# Patient Record
Sex: Female | Born: 1950
Health system: Southern US, Community
[De-identification: ages and names within clinical notes are randomized; demographics above are authoritative.]

## PROBLEM LIST (undated history)

## (undated) DIAGNOSIS — K219 Gastro-esophageal reflux disease without esophagitis: Secondary | ICD-10-CM

## (undated) DIAGNOSIS — R0600 Dyspnea, unspecified: Secondary | ICD-10-CM

## (undated) DIAGNOSIS — J189 Pneumonia, unspecified organism: Secondary | ICD-10-CM

## (undated) DIAGNOSIS — B009 Herpesviral infection, unspecified: Secondary | ICD-10-CM

## (undated) DIAGNOSIS — S32020A Wedge compression fracture of second lumbar vertebra, initial encounter for closed fracture: Secondary | ICD-10-CM

## (undated) DIAGNOSIS — Z915 Personal history of self-harm: Secondary | ICD-10-CM

## (undated) DIAGNOSIS — F1011 Alcohol abuse, in remission: Secondary | ICD-10-CM

## (undated) DIAGNOSIS — F32A Depression, unspecified: Secondary | ICD-10-CM

## (undated) DIAGNOSIS — G8929 Other chronic pain: Secondary | ICD-10-CM

## (undated) DIAGNOSIS — M549 Dorsalgia, unspecified: Secondary | ICD-10-CM

## (undated) DIAGNOSIS — F329 Major depressive disorder, single episode, unspecified: Secondary | ICD-10-CM

## (undated) DIAGNOSIS — R112 Nausea with vomiting, unspecified: Secondary | ICD-10-CM

## (undated) DIAGNOSIS — G43909 Migraine, unspecified, not intractable, without status migrainosus: Secondary | ICD-10-CM

## (undated) DIAGNOSIS — F419 Anxiety disorder, unspecified: Secondary | ICD-10-CM

## (undated) DIAGNOSIS — Z9151 Personal history of suicidal behavior: Secondary | ICD-10-CM

## (undated) DIAGNOSIS — Z9889 Other specified postprocedural states: Secondary | ICD-10-CM

## (undated) DIAGNOSIS — Z9089 Acquired absence of other organs: Secondary | ICD-10-CM

## (undated) DIAGNOSIS — D649 Anemia, unspecified: Secondary | ICD-10-CM

## (undated) DIAGNOSIS — IMO0002 Reserved for concepts with insufficient information to code with codable children: Secondary | ICD-10-CM

## (undated) DIAGNOSIS — Z8719 Personal history of other diseases of the digestive system: Secondary | ICD-10-CM

## (undated) DIAGNOSIS — E039 Hypothyroidism, unspecified: Secondary | ICD-10-CM

## (undated) DIAGNOSIS — F319 Bipolar disorder, unspecified: Secondary | ICD-10-CM

## (undated) HISTORY — PX: EYE SURGERY: SHX253

## (undated) HISTORY — DX: Migraine, unspecified, not intractable, without status migrainosus: G43.909

## (undated) HISTORY — PX: COLONOSCOPY: SHX174

## (undated) HISTORY — PX: HYSTEROSCOPY: SHX211

## (undated) HISTORY — PX: TONSILLECTOMY: SUR1361

## (undated) HISTORY — PX: LUMBAR FUSION: SHX111

## (undated) HISTORY — PX: OTHER SURGICAL HISTORY: SHX169

## (undated) HISTORY — DX: Herpesviral infection, unspecified: B00.9

## (undated) HISTORY — PX: DILATION AND CURETTAGE OF UTERUS: SHX78

---

## 1998-07-31 ENCOUNTER — Emergency Department (HOSPITAL_COMMUNITY): Admission: EM | Admit: 1998-07-31 | Discharge: 1998-07-31 | Payer: Self-pay | Admitting: Emergency Medicine

## 1998-12-19 ENCOUNTER — Other Ambulatory Visit: Admission: RE | Admit: 1998-12-19 | Discharge: 1998-12-19 | Payer: Self-pay | Admitting: Obstetrics and Gynecology

## 2000-03-13 ENCOUNTER — Other Ambulatory Visit: Admission: RE | Admit: 2000-03-13 | Discharge: 2000-03-13 | Payer: Self-pay | Admitting: Obstetrics and Gynecology

## 2001-04-07 ENCOUNTER — Other Ambulatory Visit: Admission: RE | Admit: 2001-04-07 | Discharge: 2001-04-07 | Payer: Self-pay | Admitting: Obstetrics and Gynecology

## 2002-04-08 ENCOUNTER — Other Ambulatory Visit: Admission: RE | Admit: 2002-04-08 | Discharge: 2002-04-08 | Payer: Self-pay | Admitting: Obstetrics and Gynecology

## 2002-07-17 ENCOUNTER — Encounter: Admission: RE | Admit: 2002-07-17 | Discharge: 2002-07-17 | Payer: Self-pay | Admitting: Cardiology

## 2002-07-17 ENCOUNTER — Encounter: Payer: Self-pay | Admitting: Cardiology

## 2003-04-05 ENCOUNTER — Other Ambulatory Visit: Admission: RE | Admit: 2003-04-05 | Discharge: 2003-04-05 | Payer: Self-pay | Admitting: Obstetrics and Gynecology

## 2004-04-07 ENCOUNTER — Other Ambulatory Visit: Admission: RE | Admit: 2004-04-07 | Discharge: 2004-04-07 | Payer: Self-pay | Admitting: Obstetrics and Gynecology

## 2005-06-17 ENCOUNTER — Emergency Department (HOSPITAL_COMMUNITY): Admission: EM | Admit: 2005-06-17 | Discharge: 2005-06-17 | Payer: Self-pay | Admitting: Emergency Medicine

## 2005-09-04 ENCOUNTER — Other Ambulatory Visit: Admission: RE | Admit: 2005-09-04 | Discharge: 2005-09-04 | Payer: Self-pay | Admitting: Obstetrics and Gynecology

## 2006-08-28 ENCOUNTER — Ambulatory Visit: Payer: Self-pay | Admitting: Pulmonary Disease

## 2006-09-02 ENCOUNTER — Emergency Department (HOSPITAL_COMMUNITY): Admission: EM | Admit: 2006-09-02 | Discharge: 2006-09-02 | Payer: Self-pay | Admitting: Emergency Medicine

## 2006-09-04 ENCOUNTER — Inpatient Hospital Stay (HOSPITAL_COMMUNITY): Admission: AD | Admit: 2006-09-04 | Discharge: 2006-09-09 | Payer: Self-pay | Admitting: Pulmonary Disease

## 2006-09-04 ENCOUNTER — Ambulatory Visit: Payer: Self-pay | Admitting: Pulmonary Disease

## 2006-09-23 ENCOUNTER — Other Ambulatory Visit: Admission: RE | Admit: 2006-09-23 | Discharge: 2006-09-23 | Payer: Self-pay | Admitting: Obstetrics and Gynecology

## 2006-09-25 ENCOUNTER — Ambulatory Visit: Payer: Self-pay | Admitting: Pulmonary Disease

## 2008-04-05 ENCOUNTER — Other Ambulatory Visit: Admission: RE | Admit: 2008-04-05 | Discharge: 2008-04-05 | Payer: Self-pay | Admitting: Obstetrics and Gynecology

## 2008-04-05 ENCOUNTER — Encounter: Payer: Self-pay | Admitting: Obstetrics and Gynecology

## 2008-04-05 ENCOUNTER — Ambulatory Visit: Payer: Self-pay | Admitting: Obstetrics and Gynecology

## 2008-06-10 ENCOUNTER — Ambulatory Visit: Payer: Self-pay | Admitting: Obstetrics and Gynecology

## 2008-06-10 ENCOUNTER — Other Ambulatory Visit: Admission: RE | Admit: 2008-06-10 | Discharge: 2008-06-10 | Payer: Self-pay | Admitting: Obstetrics and Gynecology

## 2008-06-10 ENCOUNTER — Encounter: Payer: Self-pay | Admitting: Obstetrics and Gynecology

## 2008-06-21 ENCOUNTER — Ambulatory Visit: Payer: Self-pay | Admitting: Obstetrics and Gynecology

## 2008-07-09 ENCOUNTER — Ambulatory Visit: Payer: Self-pay | Admitting: Obstetrics and Gynecology

## 2008-08-03 ENCOUNTER — Encounter: Payer: Self-pay | Admitting: Pulmonary Disease

## 2008-08-11 ENCOUNTER — Encounter: Payer: Self-pay | Admitting: Pulmonary Disease

## 2008-08-11 ENCOUNTER — Telehealth (INDEPENDENT_AMBULATORY_CARE_PROVIDER_SITE_OTHER): Payer: Self-pay | Admitting: *Deleted

## 2008-08-13 ENCOUNTER — Encounter: Admission: RE | Admit: 2008-08-13 | Discharge: 2008-08-13 | Payer: Self-pay | Admitting: Allergy and Immunology

## 2008-08-13 ENCOUNTER — Ambulatory Visit: Payer: Self-pay | Admitting: Pulmonary Disease

## 2008-08-13 DIAGNOSIS — F319 Bipolar disorder, unspecified: Secondary | ICD-10-CM | POA: Insufficient documentation

## 2008-08-13 DIAGNOSIS — E7800 Pure hypercholesterolemia, unspecified: Secondary | ICD-10-CM | POA: Insufficient documentation

## 2008-08-13 DIAGNOSIS — K449 Diaphragmatic hernia without obstruction or gangrene: Secondary | ICD-10-CM | POA: Insufficient documentation

## 2008-08-13 DIAGNOSIS — J449 Chronic obstructive pulmonary disease, unspecified: Secondary | ICD-10-CM | POA: Insufficient documentation

## 2008-08-13 DIAGNOSIS — K219 Gastro-esophageal reflux disease without esophagitis: Secondary | ICD-10-CM | POA: Insufficient documentation

## 2008-08-13 DIAGNOSIS — T7840XA Allergy, unspecified, initial encounter: Secondary | ICD-10-CM | POA: Insufficient documentation

## 2008-08-13 DIAGNOSIS — E039 Hypothyroidism, unspecified: Secondary | ICD-10-CM | POA: Insufficient documentation

## 2008-09-03 ENCOUNTER — Encounter: Payer: Self-pay | Admitting: Pulmonary Disease

## 2008-11-02 ENCOUNTER — Encounter: Payer: Self-pay | Admitting: Pulmonary Disease

## 2008-11-16 ENCOUNTER — Encounter: Payer: Self-pay | Admitting: Obstetrics and Gynecology

## 2008-11-16 ENCOUNTER — Ambulatory Visit: Payer: Self-pay | Admitting: Obstetrics and Gynecology

## 2008-11-16 ENCOUNTER — Other Ambulatory Visit: Admission: RE | Admit: 2008-11-16 | Discharge: 2008-11-16 | Payer: Self-pay | Admitting: Obstetrics and Gynecology

## 2008-11-26 ENCOUNTER — Ambulatory Visit: Payer: Self-pay | Admitting: Obstetrics and Gynecology

## 2008-12-01 ENCOUNTER — Ambulatory Visit (HOSPITAL_BASED_OUTPATIENT_CLINIC_OR_DEPARTMENT_OTHER): Admission: RE | Admit: 2008-12-01 | Discharge: 2008-12-01 | Payer: Self-pay | Admitting: Obstetrics and Gynecology

## 2008-12-01 ENCOUNTER — Ambulatory Visit: Payer: Self-pay | Admitting: Obstetrics and Gynecology

## 2008-12-01 ENCOUNTER — Encounter: Payer: Self-pay | Admitting: Obstetrics and Gynecology

## 2009-06-10 ENCOUNTER — Ambulatory Visit: Payer: Self-pay | Admitting: Obstetrics and Gynecology

## 2009-06-10 ENCOUNTER — Other Ambulatory Visit: Admission: RE | Admit: 2009-06-10 | Discharge: 2009-06-10 | Payer: Self-pay | Admitting: Obstetrics and Gynecology

## 2009-06-20 ENCOUNTER — Ambulatory Visit: Payer: Self-pay | Admitting: Obstetrics and Gynecology

## 2009-07-04 ENCOUNTER — Ambulatory Visit: Payer: Self-pay | Admitting: Obstetrics and Gynecology

## 2009-08-24 ENCOUNTER — Ambulatory Visit: Payer: Self-pay | Admitting: Obstetrics and Gynecology

## 2009-10-12 ENCOUNTER — Ambulatory Visit: Payer: Self-pay | Admitting: Obstetrics and Gynecology

## 2010-01-11 ENCOUNTER — Encounter: Payer: Self-pay | Admitting: Pulmonary Disease

## 2010-01-16 ENCOUNTER — Emergency Department (HOSPITAL_COMMUNITY)
Admission: EM | Admit: 2010-01-16 | Discharge: 2010-01-16 | Payer: Self-pay | Source: Home / Self Care | Admitting: Emergency Medicine

## 2010-02-20 ENCOUNTER — Ambulatory Visit: Payer: Self-pay | Admitting: Gynecology

## 2010-03-09 ENCOUNTER — Encounter: Payer: Self-pay | Admitting: Pulmonary Disease

## 2010-04-18 NOTE — Letter (Signed)
Summary: [facility]   Imported By: Lester  02/20/2010 09:07:59  _____________________________________________________________________  External Attachment:    Type:   Image     Comment:   External Document

## 2010-04-20 NOTE — Letter (Signed)
Summary: [facility]   Imported By: Sherian Rein 03/23/2010 10:11:03  _____________________________________________________________________  External Attachment:    Type:   Image     Comment:   External Document

## 2010-04-20 NOTE — Letter (Signed)
Summary: Addendum/[facility]  Addendum/[facility]   Imported By: Sherian Rein 03/23/2010 10:34:49  _____________________________________________________________________  External Attachment:    Type:   Image     Comment:   External Document

## 2010-06-01 ENCOUNTER — Other Ambulatory Visit (INDEPENDENT_AMBULATORY_CARE_PROVIDER_SITE_OTHER): Payer: BC Managed Care – PPO

## 2010-06-01 DIAGNOSIS — R635 Abnormal weight gain: Secondary | ICD-10-CM

## 2010-06-01 DIAGNOSIS — R5383 Other fatigue: Secondary | ICD-10-CM

## 2010-06-01 DIAGNOSIS — R5381 Other malaise: Secondary | ICD-10-CM

## 2010-06-01 DIAGNOSIS — E7800 Pure hypercholesterolemia, unspecified: Secondary | ICD-10-CM

## 2010-06-08 ENCOUNTER — Other Ambulatory Visit: Payer: Self-pay | Admitting: Neurosurgery

## 2010-06-08 DIAGNOSIS — S32040A Wedge compression fracture of fourth lumbar vertebra, initial encounter for closed fracture: Secondary | ICD-10-CM

## 2010-06-09 ENCOUNTER — Other Ambulatory Visit: Payer: Self-pay | Admitting: Neurosurgery

## 2010-06-09 ENCOUNTER — Ambulatory Visit
Admission: RE | Admit: 2010-06-09 | Discharge: 2010-06-09 | Disposition: A | Discharge status: Home / Self Care | Payer: BC Managed Care – PPO | Source: Ambulatory Visit | Attending: Neurosurgery | Admitting: Neurosurgery

## 2010-06-09 DIAGNOSIS — S32040A Wedge compression fracture of fourth lumbar vertebra, initial encounter for closed fracture: Secondary | ICD-10-CM

## 2010-06-09 DIAGNOSIS — S32009A Unspecified fracture of unspecified lumbar vertebra, initial encounter for closed fracture: Secondary | ICD-10-CM

## 2010-06-09 MED ORDER — GADOBENATE DIMEGLUMINE 529 MG/ML IV SOLN
15.0000 mL | Freq: Once | INTRAVENOUS | Status: AC | PRN
  Administered 2010-06-09: 15 mL via INTRAVENOUS

## 2010-06-12 ENCOUNTER — Other Ambulatory Visit (HOSPITAL_COMMUNITY)
Admission: RE | Admit: 2010-06-12 | Discharge: 2010-06-12 | Disposition: A | Discharge status: Home / Self Care | Payer: BC Managed Care – PPO | Source: Ambulatory Visit | Attending: Obstetrics and Gynecology | Admitting: Obstetrics and Gynecology

## 2010-06-12 ENCOUNTER — Encounter (INDEPENDENT_AMBULATORY_CARE_PROVIDER_SITE_OTHER): Payer: BC Managed Care – PPO | Admitting: Obstetrics and Gynecology

## 2010-06-12 ENCOUNTER — Other Ambulatory Visit: Payer: Self-pay | Admitting: Obstetrics and Gynecology

## 2010-06-12 DIAGNOSIS — Z124 Encounter for screening for malignant neoplasm of cervix: Secondary | ICD-10-CM | POA: Insufficient documentation

## 2010-06-12 DIAGNOSIS — Z01419 Encounter for gynecological examination (general) (routine) without abnormal findings: Secondary | ICD-10-CM

## 2010-06-13 ENCOUNTER — Other Ambulatory Visit: Payer: Self-pay | Admitting: Neurosurgery

## 2010-06-13 DIAGNOSIS — M5410 Radiculopathy, site unspecified: Secondary | ICD-10-CM

## 2010-06-13 DIAGNOSIS — M549 Dorsalgia, unspecified: Secondary | ICD-10-CM

## 2010-06-15 ENCOUNTER — Ambulatory Visit
Admission: RE | Admit: 2010-06-15 | Discharge: 2010-06-15 | Disposition: A | Discharge status: Home / Self Care | Payer: BC Managed Care – PPO | Source: Ambulatory Visit | Attending: Neurosurgery | Admitting: Neurosurgery

## 2010-06-15 DIAGNOSIS — M549 Dorsalgia, unspecified: Secondary | ICD-10-CM

## 2010-06-15 DIAGNOSIS — M5410 Radiculopathy, site unspecified: Secondary | ICD-10-CM

## 2010-06-22 DIAGNOSIS — Z1211 Encounter for screening for malignant neoplasm of colon: Secondary | ICD-10-CM

## 2010-07-20 ENCOUNTER — Other Ambulatory Visit (HOSPITAL_COMMUNITY): Payer: Self-pay | Admitting: Neurosurgery

## 2010-07-20 ENCOUNTER — Ambulatory Visit (HOSPITAL_COMMUNITY)
Admission: RE | Admit: 2010-07-20 | Discharge: 2010-07-20 | Disposition: A | Discharge status: Home / Self Care | Payer: BC Managed Care – PPO | Source: Ambulatory Visit | Attending: Neurosurgery | Admitting: Neurosurgery

## 2010-07-20 ENCOUNTER — Encounter (HOSPITAL_COMMUNITY)
Admission: RE | Admit: 2010-07-20 | Discharge: 2010-07-20 | Disposition: A | Discharge status: Home / Self Care | Payer: BC Managed Care – PPO | Source: Ambulatory Visit | Attending: Neurosurgery | Admitting: Neurosurgery

## 2010-07-20 DIAGNOSIS — R05 Cough: Secondary | ICD-10-CM | POA: Insufficient documentation

## 2010-07-20 DIAGNOSIS — K449 Diaphragmatic hernia without obstruction or gangrene: Secondary | ICD-10-CM | POA: Insufficient documentation

## 2010-07-20 DIAGNOSIS — Z01810 Encounter for preprocedural cardiovascular examination: Secondary | ICD-10-CM | POA: Insufficient documentation

## 2010-07-20 DIAGNOSIS — R0602 Shortness of breath: Secondary | ICD-10-CM | POA: Insufficient documentation

## 2010-07-20 DIAGNOSIS — Z01811 Encounter for preprocedural respiratory examination: Secondary | ICD-10-CM

## 2010-07-20 DIAGNOSIS — Z01812 Encounter for preprocedural laboratory examination: Secondary | ICD-10-CM | POA: Insufficient documentation

## 2010-07-20 DIAGNOSIS — Z01818 Encounter for other preprocedural examination: Secondary | ICD-10-CM | POA: Insufficient documentation

## 2010-07-20 DIAGNOSIS — R059 Cough, unspecified: Secondary | ICD-10-CM | POA: Insufficient documentation

## 2010-07-20 LAB — CBC
HCT: 42.7 % (ref 36.0–46.0)
Hemoglobin: 15 g/dL (ref 12.0–15.0)
MCH: 31.1 pg (ref 26.0–34.0)
MCHC: 35.1 g/dL (ref 30.0–36.0)
MCV: 88.4 fL (ref 78.0–100.0)
Platelets: 349 10*3/uL (ref 150–400)
RBC: 4.83 MIL/uL (ref 3.87–5.11)
RDW: 12.4 % (ref 11.5–15.5)
WBC: 7.7 10*3/uL (ref 4.0–10.5)

## 2010-07-20 LAB — TYPE AND SCREEN
ABO/RH(D): AB POS
Antibody Screen: NEGATIVE

## 2010-07-20 LAB — BASIC METABOLIC PANEL
BUN: 13 mg/dL (ref 6–23)
CO2: 29 mEq/L (ref 19–32)
Calcium: 10.4 mg/dL (ref 8.4–10.5)
Chloride: 91 mEq/L — ABNORMAL LOW (ref 96–112)
Creatinine, Ser: 0.66 mg/dL (ref 0.4–1.2)
GFR calc Af Amer: >60 mL/min (ref >60)
GFR calc non Af Amer: >60 mL/min (ref >60)
Glucose, Bld: 88 mg/dL (ref 70–99)
Potassium: 3.6 mEq/L (ref 3.5–5.1)
Sodium: 129 mEq/L — ABNORMAL LOW (ref 135–145)

## 2010-07-20 LAB — ABO/RH: ABO/RH(D): AB POS

## 2010-07-20 LAB — SURGICAL PCR SCREEN
MRSA, PCR: NEGATIVE
Staphylococcus aureus: NEGATIVE

## 2010-07-25 ENCOUNTER — Inpatient Hospital Stay (HOSPITAL_COMMUNITY): Payer: BC Managed Care – PPO

## 2010-07-25 ENCOUNTER — Inpatient Hospital Stay (HOSPITAL_COMMUNITY)
Admission: RE | Admit: 2010-07-25 | Discharge: 2010-08-01 | DRG: 756 | Disposition: A | Discharge status: Home Health | Payer: BC Managed Care – PPO | Source: Ambulatory Visit | Attending: Neurosurgery | Admitting: Neurosurgery

## 2010-07-25 DIAGNOSIS — M4310 Spondylolisthesis, site unspecified: Secondary | ICD-10-CM | POA: Diagnosis present

## 2010-07-25 DIAGNOSIS — M47817 Spondylosis without myelopathy or radiculopathy, lumbosacral region: Secondary | ICD-10-CM | POA: Diagnosis present

## 2010-07-25 DIAGNOSIS — M48061 Spinal stenosis, lumbar region without neurogenic claudication: Secondary | ICD-10-CM | POA: Diagnosis present

## 2010-07-25 DIAGNOSIS — M51379 Other intervertebral disc degeneration, lumbosacral region without mention of lumbar back pain or lower extremity pain: Principal | ICD-10-CM | POA: Diagnosis present

## 2010-07-25 DIAGNOSIS — M5137 Other intervertebral disc degeneration, lumbosacral region: Principal | ICD-10-CM | POA: Diagnosis present

## 2010-07-25 DIAGNOSIS — M810 Age-related osteoporosis without current pathological fracture: Secondary | ICD-10-CM | POA: Diagnosis present

## 2010-07-25 DIAGNOSIS — M7130 Other bursal cyst, unspecified site: Secondary | ICD-10-CM | POA: Diagnosis present

## 2010-07-26 ENCOUNTER — Inpatient Hospital Stay (HOSPITAL_COMMUNITY): Payer: BC Managed Care – PPO

## 2010-07-28 DIAGNOSIS — Q762 Congenital spondylolisthesis: Secondary | ICD-10-CM

## 2010-07-28 DIAGNOSIS — IMO0002 Reserved for concepts with insufficient information to code with codable children: Secondary | ICD-10-CM

## 2010-07-31 ENCOUNTER — Inpatient Hospital Stay (HOSPITAL_COMMUNITY): Payer: BC Managed Care – PPO

## 2010-07-31 LAB — URINALYSIS, ROUTINE W REFLEX MICROSCOPIC
Bilirubin Urine: NEGATIVE
Glucose, UA: NEGATIVE mg/dL
Ketones, ur: NEGATIVE mg/dL
Nitrite: NEGATIVE
Protein, ur: NEGATIVE mg/dL
Specific Gravity, Urine: 1.016 (ref 1.005–1.030)
Urobilinogen, UA: 1 mg/dL (ref 0.0–1.0)
pH: 6.5 (ref 5.0–8.0)

## 2010-07-31 LAB — CBC
HCT: 29 % — ABNORMAL LOW (ref 36.0–46.0)
Hemoglobin: 10.1 g/dL — ABNORMAL LOW (ref 12.0–15.0)
MCH: 30.5 pg (ref 26.0–34.0)
MCHC: 34.8 g/dL (ref 30.0–36.0)
MCV: 87.6 fL (ref 78.0–100.0)
Platelets: 403 K/uL — ABNORMAL HIGH (ref 150–400)
RBC: 3.31 MIL/uL — ABNORMAL LOW (ref 3.87–5.11)
RDW: 12.6 % (ref 11.5–15.5)
WBC: 11.2 K/uL — ABNORMAL HIGH (ref 4.0–10.5)

## 2010-07-31 LAB — URINE MICROSCOPIC-ADD ON

## 2010-08-01 NOTE — Discharge Summary (Signed)
NAMELORRI, FUKUHARA            ACCOUNT NO.:  [id number]   MEDICAL RECORD NO.:  [id number]          PATIENT TYPE:  INP   LOCATION:  5706                         FACILITY:  [facility]   PHYSICIAN:  Lonzo Cloud. Kriste Basque, MD     DATE OF BIRTH:  25-Nov-1950   DATE OF ADMISSION:  09/04/2006  DATE OF DISCHARGE:  09/09/2006                               DISCHARGE SUMMARY   FINAL DIAGNOSES:  1. Admitted 08/25/06 with refractory asthmatic bronchitis.  Treated      with IV Solu-Medrol, inhaled bronchodilators and mucolytic therapy.      The patient responded and discharged wheeze free on tapering dose      of prednisone, Symbicort and Xopenex metered-dose inhalers.  2. History of allergies with previous eczema and hay fever.  She was      on shots in the early 90s.  3. History of bipolar disease - treated by Dr. Wynonia Lawman, currently on      Prozac, Trileptal and Wellbutrin.  4. History of alcohol dependence in the past.  5. History of abnormal chest x-ray with right cardiophrenic angle fat      pad identified by CT years ago.  6. History of hypercholesterolemia - the patient controls this with      diet alone.  7. History of hypothyroidism - on Synthroid therapy.  8. Status post tonsillectomy and adenoidectomy in 1992 by Dr. Narda Bonds.  9. Status post right inguinal hernia repair in 1992 by Dr. Maryagnes Amos.  10.Known hiatus hernia - seen on chest x-rays.  11.History of sensitivity to prednisone which causes increased      anxiety.  She has a history of intolerance to lithium.   BRIEF HISTORY AND PHYSICAL:  The patient is a 60 year old white female  with a history of asthma and asthmatic bronchitis last seen in 2004.  She presented recently with a 60-month history of recurrent upper  respiratory tract infections, intermittent fevers and exacerbation of  her asthmatic bronchitis with cough, thick  sputum production, severe  congestion and chest tightness with wheezing and progressive dyspnea.  She  notes a chest soreness secondary to severe coughing paroxysms and  difficulty resting at night.  She states she had been receiving her  primary care at [facility] [place]  her employer [employer].  She would also [facility]  Most recently she had been on Biaxin without relief  of her symptoms.  Evaluation included chest x-ray that showed lingular  atelectasis and/or scarring; pulmonary function studies showing FEV-1 of  1.89 liters (70% of predicted).  She had been placed on Medrol,  Symbicort, Xopenex, Mucinex and Tussionex but proved intolerant to the  Medrol with increased anxiety and mania.  As noted above she has a  significant psychiatric history with bipolar disease and a history of  intolerance to lithium.  She is treated by Dr. Wynonia Lawman, currently on  Trileptal, Wellbutrin and Prozac.  The patient states that Dr. Wynonia Lawman  had requested that we admit the patient to the hospital for IV  Solu-  Medrol and careful monitoring.  He did not want the patient self  treating her anxiety and mania with oral Xanax, and per their request  stated she should be on IV Ativan.   PAST MEDICAL HISTORY:  In addition to her reactive airways disease she  has a history of allergies with eczema and hay fever in the past.  She  was previously on shots in the early 90s.  As noted she has a bipolar  disease, a history of alcohol dependence in the past and anxiety.  She  has a known abnormal chest x-ray with a right cardiophrenic angle fat  pad identified on CT.  She has a history of hypercholesterolemia that  she controls on diet.  She has a history of hypothyroidism and takes  Synthroid.  She has a known hiatus hernia that does not require any  specific therapy per the patient.  She had a previous tonsillectomy and  adenoidectomy in 1992 by Dr. Narda Bonds.  She had a previous right  inguinal hernia repair in 1992 by Dr. Maryagnes Amos.  She is  sensitive to Medrol  and intolerant to lithium as noted.   PHYSICAL EXAMINATION:  Physical examination at the time of admission  revealed a 60 year old white female in no acute distress.  Blood  pressure 130/90, pulse 100 and regular, respirations 20 per minute and  not labored, O2 sat 97% on room air, temperature 98.3.  HEENT exam was  unremarkable.  Neck exam showed no jugular distension, no carotid  bruits, no thyromegaly or lymphadenopathy.  Chest exam revealed  expiratory wheezing and rhonchi bilaterally.  No signs of consolidation.  Cardiac exam revealed regular rhythm, normal S1-S2 without murmurs, rubs  or gallops detected.  The abdomen was soft, minimal epigastric  tenderness on palpation.  No evidence of hepatosplenomegaly.  Normal  bowel sounds.  Rectal was deferred.  Extremities showed no cyanosis,  clubbing or edema.  Neurologic exam was intact without any focal  neurologic changes.  Skin exam was negative.   LABORATORY DATA:  Chest x-ray revealed right middle lobe and lingular  scarring unchanged.  No acute infiltrates or effusions.  Normal heart  size.  EKG showed normal sinus rhythm, was within normal limits.  Hemoglobin 15.1, hematocrit 44.2, white count 9600 with 94% segs.  Sed  rate 3.  Pro time 12.8, INR 1.0.  Sodium 130, potassium 4.3, chloride  96, CO2 24, BUN 12, creatinine 0.86, blood sugar 194, repeat 101,  calcium 9.6, total protein 7.0, albumin 4.1, AST 24, ALT 23, alk phos  83, total bilirubin 0.3, TSH 1.81.  Serum IgE level 3.3.   HOSPITAL COURSE:  The patient was admitted with asthmatic bronchitis,  placed on IV fluids, IV Solu-Medrol, given nebulizer treatments with  Xopenex and Atrovent and Mucinex in maximum doses.  She responded slowly  to this therapy.  She was continued on her psychotropic drugs including  Prozac, Trileptal and Wellbutrin.  She was covered with IV Ativan for anxiety and mania.  She was given flutter valve treatments to try to aid   in mucociliary clearance.  The patient states she coughed up quite a bit  of thick sputum the first day but did not save this for analysis.  After  that there was no sputum production at all.  Her nebulizer treatments  were changed to Symbicort and Xopenex.  Her IV Solu-Medrol was  discontinued in favor of oral prednisone.  She was continued on the  Mucinex, etc.  She was felt to be MHB and ready for discharge on  09/09/2006.   MEDICATIONS AT DISCHARGE:  Prednisone 20 mg p.o. daily for 5 days and  half tablet daily until return.  Symbicort 160/4.5, two sprays b.i.d.  Xopenex metered-dose inhaler 2 sprays every 4 to 6 hours as needed.  Guaifenesin 60 mg tablets 2 tablets p.o. b.i.d. with plenty of fluids.  Protonix 40 mg p.o. q. a.m..  Prozac 20 mg p.o. daily.  Trileptal 150 mg  p.o. b.i.d..  Wellbutrin 300 mg p.o. daily.  Synthroid 125 mcg p.o.  daily.   CONDITION ON DISCHARGE:  Improved.   FOLLOW UP:  The patient will return to the office in 2 weeks and call  for any problems.      Lonzo Cloud. Kriste Basque, MD  Electronically Signed     SMN/MEDQ  D:  09/12/2006  T:  09/12/2006  Job:  [id number]   cc:   Lynden Ang, M.D.

## 2010-08-01 NOTE — Op Note (Signed)
NAME:  Victoria Levine, Victoria Levine            ACCOUNT NO.:  [id number]  MEDICAL RECORD NO.:  [id number]           PATIENT TYPE:  I  LOCATION:  3021                         FACILITY:  [facility]  PHYSICIAN:  Hilda Lias, M.D.   DATE OF BIRTH:  [birth year]  DATE OF PROCEDURE:  07/25/2010 DATE OF DISCHARGE:                              OPERATIVE REPORT   PREOPERATIVE DIAGNOSES:  Bilateral L4 pars interarticularis defect with L4-L5 grade 1 spondylolisthesis, lumbar stenosis, lateral recess stenosis, lumbar spondylosis, degenerative disk disease, lumbar radiculopathy, L3 stenosis, osteoporosis.  POSTOPERATIVE DIAGNOSES:  Bilateral L4 pars interarticularis defect with L4-L5 grade 1 spondylolisthesis, lumbar stenosis, lateral recess stenosis, lumbar spondylosis, degenerative disk disease, lumbar radiculopathy, L3 stenosis, osteoporosis.  PROCEDURE:  L4 Gill procedure with laminectomy, facetectomy, and bilateral L4-L5 foraminotomy with decompression of L4-L5 nerve root bilaterally.  Decompression of the joint what required for interbody arthrodesis with microdissection.  Bilateral posterolateral interbody fusion with cages 10 x 22 with autograft and Osteocel.  Bone marrow aspiration.  Posterolateral arthrodesis.  L4-5 pedicle screws. Bilaterally L2 laminectomy with decompression of the L3 nerve root. Bone graft stimulator.  Microscope.  Removal of synovial cyst compromising the L5 nerve root in the right side.  Cell Saver, C-arm, microscope.  SURGEON:  Hilda Lias, MD.  ASSISTANT:  Danae Orleans. Venetia Maxon, MD.  CLINICAL HISTORY:  The patient was admitted because of back pain worse in both legs.  X-rays shows that she has severe stenosis in the mid L3- L4 with a grade 1 spondylolisthesis at the level of L4-5.  The patient has a history osteoporosis.  Surgery was advised and the risk was fully explained to her.  DESCRIPTION OF PROCEDURE:  The patient was taken to the OR, and after intubation, she was  positioned in prone manner.  The back was cleaned with DuraPrep and drapes were applied.  Midline incision was made.  We were unable to feel any bony structure.  Incision was carried out until we found the bone structure and then the dissection was carried down to be able to feel and see the spinous process of L5, L4, and L3.  Then, retraction was achieved laterally all the way down to able to dissect the lateral aspect of the L3-4, L4-5, and L5-S1 facet.  X-rays showed that indeed we were at the level of L4-5.  From then on, we proceed with a Gill procedure, removing the spinous process of L4.  This was loose and the loosening was mostly at the level of the facet.  The lamina, the spinal process, and the facet was removed all the way lateral to be able to get into the L4-5 space.  The ligament was removed, and we found at the level of L5 in the right side, the patient has a synovial cyst attached to the L5 nerve root.  This was removed using microdissection. Then, we entered the disk space and we opened the disk space first in the right and lateral in the right to the left side and total gross diskectomy was achieved.  The endplate were removed and two cages of 10 x 22 with autograft and Osteocel were introduced.  Then, using the C-arm in AP view and then a lateral view, we brought the pedicle of L4 and L5. Using a bone marrow needle, we aspirated bone narrow.  This was used for the posterolateral arthrodesis.  Then, four screws were inserted of 5.5 x 45.  Prior to insertion, we brought the holes just to be sure they were surrounded by bone.  Then, the pedicle screws were held in place using a rod and caps.  X-rays showed good position of the bone graft and good position of the pedicle screws as well as the cages.  Then, we went laterally and we removed the periosteum of the lateral aspect of L4-5 and a mix of Osteocel and autograft was used for arthrodesis.  Then, we made a pocket in the  subcutaneous space and we introduced the battery of the bone graft stimulator and the wire was made down laterally at the level of posterolateral arthrodesis.  Hemostasis was achieved.  Once we had good hemostasis, the wound was closed with Vicryl and Steri-Strips.          ______________________________ Hilda Lias, M.D.     EB/MEDQ  D:  07/25/2010  T:  07/26/2010  Job:  [id number]  Electronically Signed by Hilda Lias M.D. on 08/01/2010 01:41:09 PM

## 2010-08-08 NOTE — Discharge Summary (Signed)
  NAME:  Victoria Levine, Victoria Levine            ACCOUNT NO.:  [id number]  MEDICAL RECORD NO.:  [id number]           PATIENT TYPE:  I  LOCATION:  3021                         FACILITY:  [facility]  PHYSICIAN:  Hilda Lias, M.D.   DATE OF BIRTH:  1950/08/23  DATE OF ADMISSION:  07/25/2010 DATE OF DISCHARGE:  08/01/2010                              DISCHARGE SUMMARY   ADMISSION DIAGNOSIS:  Degenerative disk disease L4-5, L5-S1 with spondylolisthesis with stenosis.  FINAL DIAGNOSIS:  Degenerative disk disease L4-5, L5-S1 with spondylolisthesis with stenosis.  PROCEDURE:  L4-5 fusion with pedicle screws with posterolateral arthrodesis, bilateral L3 laminectomy, removal of a cyst compromising the L5 nerve root on the right side.  CLINICAL HISTORY:  The patient was admitted because of back pain and rest of both legs.  X-ray showed that she has spondylolisthesis at the level of 4-5 with stenosis of the L3-4.  She also has a cyst compromising the right L5 nerve root.  Laboratory within normal limits.  COURSE IN THE HOSPITAL:  This surgery was done on May 8.  The patient at the beginning had quite a bit of pain getting out of bed.  With the help of physical therapy and occupational therapy, she had been ambulating much better up to the point today she had minimal back discomfort and minimal leg pain.  She was seen by the Rehab Service and because she was doing well, it was decided that she did not need intrahospital rehabilitation.  Yesterday her temperature went to 100.5.  Chest x-ray showed atelectasis.  The urinalysis was negative.  She is being discharged today to be followed by me in my office.  CONDITION ON DISCHARGE:  Improvement.  MEDICATIONS:  Percocet, Flexeril.  DIET:  Regular.  ACTIVITY:  Not to drive for at least 3 weeks.  FOLLOWUP:  She will be seen by me in my office in 3 weeks or before.          ______________________________ Hilda Lias, M.D.     EB/MEDQ  D:   08/01/2010  T:  08/01/2010  Job:  [id number]  Electronically Signed by Hilda Lias M.D. on 08/08/2010 11:05:26 AM

## 2010-10-04 ENCOUNTER — Ambulatory Visit (HOSPITAL_COMMUNITY): Payer: BC Managed Care – PPO | Attending: Endocrinology

## 2010-10-04 DIAGNOSIS — M810 Age-related osteoporosis without current pathological fracture: Secondary | ICD-10-CM | POA: Insufficient documentation

## 2010-10-05 ENCOUNTER — Emergency Department (HOSPITAL_COMMUNITY): Payer: BC Managed Care – PPO

## 2010-10-05 ENCOUNTER — Emergency Department (HOSPITAL_COMMUNITY)
Admission: EM | Admit: 2010-10-05 | Discharge: 2010-10-05 | Disposition: A | Discharge status: Home / Self Care | Payer: BC Managed Care – PPO | Attending: Emergency Medicine | Admitting: Emergency Medicine

## 2010-10-05 ENCOUNTER — Encounter (HOSPITAL_COMMUNITY): Payer: Self-pay | Admitting: Radiology

## 2010-10-05 DIAGNOSIS — R5381 Other malaise: Secondary | ICD-10-CM | POA: Insufficient documentation

## 2010-10-05 DIAGNOSIS — E039 Hypothyroidism, unspecified: Secondary | ICD-10-CM | POA: Insufficient documentation

## 2010-10-05 DIAGNOSIS — R112 Nausea with vomiting, unspecified: Secondary | ICD-10-CM | POA: Insufficient documentation

## 2010-10-05 DIAGNOSIS — R42 Dizziness and giddiness: Secondary | ICD-10-CM | POA: Insufficient documentation

## 2010-10-05 DIAGNOSIS — M810 Age-related osteoporosis without current pathological fracture: Secondary | ICD-10-CM | POA: Insufficient documentation

## 2010-10-05 DIAGNOSIS — T50995A Adverse effect of other drugs, medicaments and biological substances, initial encounter: Secondary | ICD-10-CM | POA: Insufficient documentation

## 2010-10-05 DIAGNOSIS — G8929 Other chronic pain: Secondary | ICD-10-CM | POA: Insufficient documentation

## 2010-10-05 DIAGNOSIS — R509 Fever, unspecified: Secondary | ICD-10-CM | POA: Insufficient documentation

## 2010-10-05 DIAGNOSIS — F3130 Bipolar disorder, current episode depressed, mild or moderate severity, unspecified: Secondary | ICD-10-CM | POA: Insufficient documentation

## 2010-10-05 DIAGNOSIS — J45909 Unspecified asthma, uncomplicated: Secondary | ICD-10-CM | POA: Insufficient documentation

## 2010-10-05 DIAGNOSIS — IMO0001 Reserved for inherently not codable concepts without codable children: Secondary | ICD-10-CM | POA: Insufficient documentation

## 2010-10-05 DIAGNOSIS — Z79899 Other long term (current) drug therapy: Secondary | ICD-10-CM | POA: Insufficient documentation

## 2010-10-05 DIAGNOSIS — R5383 Other fatigue: Secondary | ICD-10-CM | POA: Insufficient documentation

## 2010-10-05 DIAGNOSIS — M549 Dorsalgia, unspecified: Secondary | ICD-10-CM | POA: Insufficient documentation

## 2010-10-05 HISTORY — DX: Depression, unspecified: F32.A

## 2010-10-05 HISTORY — DX: Major depressive disorder, single episode, unspecified: F32.9

## 2010-10-05 HISTORY — DX: Bipolar disorder, unspecified: F31.9

## 2010-10-05 HISTORY — DX: Acquired absence of other organs: Z90.89

## 2010-10-05 HISTORY — DX: Reserved for concepts with insufficient information to code with codable children: IMO0002

## 2010-10-05 LAB — URINALYSIS, ROUTINE W REFLEX MICROSCOPIC
Glucose, UA: NEGATIVE mg/dL
Hgb urine dipstick: NEGATIVE
Ketones, ur: 15 mg/dL — AB
Leukocytes, UA: NEGATIVE
Nitrite: NEGATIVE
Protein, ur: NEGATIVE mg/dL
Specific Gravity, Urine: 1.035 — ABNORMAL HIGH (ref 1.005–1.030)
Urobilinogen, UA: 0.2 mg/dL (ref 0.0–1.0)
pH: 5.5 (ref 5.0–8.0)

## 2010-10-05 LAB — PROTIME-INR
INR: 1.08 (ref 0.00–1.49)
Prothrombin Time: 14.2 seconds (ref 11.6–15.2)

## 2010-10-05 LAB — COMPREHENSIVE METABOLIC PANEL
ALT: 16 U/L (ref 0–35)
AST: 16 U/L (ref 0–37)
Albumin: 3.4 g/dL — ABNORMAL LOW (ref 3.5–5.2)
Alkaline Phosphatase: 82 U/L (ref 39–117)
BUN: 16 mg/dL (ref 6–23)
CO2: 23 mEq/L (ref 19–32)
Calcium: 8.5 mg/dL (ref 8.4–10.5)
Chloride: 96 mEq/L (ref 96–112)
Creatinine, Ser: 0.48 mg/dL — ABNORMAL LOW (ref 0.50–1.10)
GFR calc Af Amer: >60 mL/min (ref >60)
GFR calc non Af Amer: >60 mL/min (ref >60)
Glucose, Bld: 115 mg/dL — ABNORMAL HIGH (ref 70–99)
Potassium: 3.3 mEq/L — ABNORMAL LOW (ref 3.5–5.1)
Sodium: 130 mEq/L — ABNORMAL LOW (ref 135–145)
Total Bilirubin: 0.2 mg/dL — ABNORMAL LOW (ref 0.3–1.2)
Total Protein: 6.3 g/dL (ref 6.0–8.3)

## 2010-10-05 LAB — DIFFERENTIAL
Basophils Absolute: 0 10*3/uL (ref 0.0–0.1)
Basophils Relative: 0 % (ref 0–1)
Eosinophils Absolute: 0 10*3/uL (ref 0.0–0.7)
Eosinophils Relative: 0 % (ref 0–5)
Lymphocytes Relative: 4 % — ABNORMAL LOW (ref 12–46)
Lymphs Abs: 0.3 10*3/uL — ABNORMAL LOW (ref 0.7–4.0)
Monocytes Absolute: 0.4 10*3/uL (ref 0.1–1.0)
Monocytes Relative: 5 % (ref 3–12)
Neutro Abs: 7.7 10*3/uL (ref 1.7–7.7)
Neutrophils Relative %: 91 % — ABNORMAL HIGH (ref 43–77)

## 2010-10-05 LAB — CK TOTAL AND CKMB (NOT AT ARMC)
CK, MB: 1.5 ng/mL (ref 0.3–4.0)
Relative Index: INVALID (ref 0.0–2.5)
Total CK: 39 U/L (ref 7–177)

## 2010-10-05 LAB — CBC
HCT: 35.6 % — ABNORMAL LOW (ref 36.0–46.0)
Hemoglobin: 12.6 g/dL (ref 12.0–15.0)
MCH: 29.9 pg (ref 26.0–34.0)
MCHC: 35.4 g/dL (ref 30.0–36.0)
MCV: 84.4 fL (ref 78.0–100.0)
Platelets: 251 10*3/uL (ref 150–400)
RBC: 4.22 MIL/uL (ref 3.87–5.11)
RDW: 12.7 % (ref 11.5–15.5)
WBC: 8.5 10*3/uL (ref 4.0–10.5)

## 2010-10-05 LAB — RAPID URINE DRUG SCREEN, HOSP PERFORMED
Amphetamines: NOT DETECTED
Barbiturates: NOT DETECTED
Benzodiazepines: POSITIVE — AB
Cocaine: NOT DETECTED
Opiates: POSITIVE — AB
Tetrahydrocannabinol: NOT DETECTED

## 2010-10-05 LAB — ETHANOL: Alcohol, Ethyl (B): <11 mg/dL (ref 0–11)

## 2010-10-05 LAB — TROPONIN I: Troponin I: <0.3 ng/mL (ref <0.30)

## 2010-10-05 LAB — APTT: aPTT: 30 seconds (ref 24–37)

## 2010-10-05 LAB — LACTIC ACID, PLASMA: Lactic Acid, Venous: 1.2 mmol/L (ref 0.5–2.2)

## 2010-10-05 LAB — LIPASE, BLOOD: Lipase: 28 U/L (ref 11–59)

## 2010-10-06 LAB — URINE CULTURE
Colony Count: NO GROWTH
Culture  Setup Time: 201207191527
Culture: NO GROWTH

## 2010-10-20 ENCOUNTER — Other Ambulatory Visit: Payer: Self-pay | Admitting: *Deleted

## 2010-10-20 MED ORDER — LEVOTHYROXINE SODIUM 137 MCG PO TABS: 137.0000 ug | ORAL_TABLET | Freq: Every day | ORAL | Status: DC

## 2010-10-31 ENCOUNTER — Encounter (HOSPITAL_COMMUNITY)
Admission: RE | Admit: 2010-10-31 | Discharge: 2010-10-31 | Disposition: A | Discharge status: Home / Self Care | Payer: BC Managed Care – PPO | Source: Ambulatory Visit | Attending: Neurosurgery | Admitting: Neurosurgery

## 2010-10-31 ENCOUNTER — Other Ambulatory Visit (HOSPITAL_COMMUNITY): Payer: BC Managed Care – PPO

## 2010-10-31 LAB — COMPREHENSIVE METABOLIC PANEL
ALT: 14 U/L (ref 0–35)
AST: 13 U/L (ref 0–37)
Albumin: 3.8 g/dL (ref 3.5–5.2)
Alkaline Phosphatase: 99 U/L (ref 39–117)
BUN: 12 mg/dL (ref 6–23)
CO2: 29 mEq/L (ref 19–32)
Calcium: 10.1 mg/dL (ref 8.4–10.5)
Chloride: 98 mEq/L (ref 96–112)
Creatinine, Ser: 0.61 mg/dL (ref 0.50–1.10)
GFR calc Af Amer: >60 mL/min (ref >60)
GFR calc non Af Amer: >60 mL/min (ref >60)
Glucose, Bld: 121 mg/dL — ABNORMAL HIGH (ref 70–99)
Potassium: 4.8 mEq/L (ref 3.5–5.1)
Sodium: 133 mEq/L — ABNORMAL LOW (ref 135–145)
Total Bilirubin: 0.1 mg/dL — ABNORMAL LOW (ref 0.3–1.2)
Total Protein: 6.9 g/dL (ref 6.0–8.3)

## 2010-10-31 LAB — CBC
HCT: 39.5 % (ref 36.0–46.0)
Hemoglobin: 13.7 g/dL (ref 12.0–15.0)
MCH: 29.3 pg (ref 26.0–34.0)
MCHC: 34.7 g/dL (ref 30.0–36.0)
MCV: 84.4 fL (ref 78.0–100.0)
Platelets: 308 10*3/uL (ref 150–400)
RBC: 4.68 MIL/uL (ref 3.87–5.11)
RDW: 12.9 % (ref 11.5–15.5)
WBC: 5.8 10*3/uL (ref 4.0–10.5)

## 2010-10-31 LAB — SURGICAL PCR SCREEN
MRSA, PCR: NEGATIVE
Staphylococcus aureus: NEGATIVE

## 2010-11-01 ENCOUNTER — Encounter (HOSPITAL_COMMUNITY): Payer: BC Managed Care – PPO

## 2010-11-01 ENCOUNTER — Ambulatory Visit (HOSPITAL_COMMUNITY)
Admission: RE | Admit: 2010-11-01 | Discharge: 2010-11-01 | Disposition: A | Discharge status: Home / Self Care | Payer: BC Managed Care – PPO | Source: Ambulatory Visit | Attending: Neurosurgery | Admitting: Neurosurgery

## 2010-11-01 DIAGNOSIS — Z01812 Encounter for preprocedural laboratory examination: Secondary | ICD-10-CM | POA: Insufficient documentation

## 2010-11-01 DIAGNOSIS — Z981 Arthrodesis status: Secondary | ICD-10-CM | POA: Insufficient documentation

## 2010-11-01 DIAGNOSIS — M810 Age-related osteoporosis without current pathological fracture: Secondary | ICD-10-CM | POA: Insufficient documentation

## 2010-11-01 DIAGNOSIS — Z4689 Encounter for fitting and adjustment of other specified devices: Secondary | ICD-10-CM | POA: Insufficient documentation

## 2010-11-04 NOTE — Op Note (Signed)
  NAME:  Victoria Levine, Victoria Levine NO.:  [id number]  MEDICAL RECORD NO.:  [id number]  LOCATION:  SDSC                         FACILITY:  [facility]  PHYSICIAN:  Hilda Lias, M.D.   DATE OF BIRTH:  10-07-1950  DATE OF PROCEDURE: DATE OF DISCHARGE:  11/01/2010                              OPERATIVE REPORT   PREOPERATIVE DIAGNOSIS:  L3-4, L4-5 lumbar fusion with a bone stimulator.  POSTOPERATIVE DIAGNOSIS:  L3-4, L4-5 lumbar fusion with a bone stimulator.  PROCEDURE:  Removal of bone stimulator.  SURGEON:  Hilda Lias, MD  CLINICAL HISTORY:  [name] is a lady, who underwent surgery at the level of L2-3, L3-4, L4-5 with fusion.  We used a bone stimulator because of history of osteoporosis.  Now, the patient called my office telling me that the area where she had the bone stimulator is quite sore and is quite painful.  X-rays showed good fusion with the pedicle screws into position.  Because of that, we are going to proceed with removal of bone stimulator.  PROCEDURE IN DETAIL:  The patient was taken to the OR and after intubation, she was positioned on prone manner.  The back was cleaned with DuraPrep.  Midline incision using the lower part of the previous one was made.  Dissection was carried out mostly in the left side. Right underneath the subcutaneous space, we found the bone stimulator. We knew that the wire were attached to the hardware and with this attached the wire from the main bone stimulator.  Then on, the area was irrigated.  Hemostasis was done with bipolar and the wound was closed with Vicryl and Steri-Strips.  The patient is going to go to PACU and discharge once stable.          ______________________________ Hilda Lias, M.D.     EB/MEDQ  D:  11/01/2010  T:  11/01/2010  Job:  [id number]  Electronically Signed by Hilda Lias M.D. on 11/04/2010 09:44:04 AM

## 2010-11-04 NOTE — H&P (Signed)
  NAME:  PERRY, MOLLA NO.:  [id number]  MEDICAL RECORD NO.:  [id number]  LOCATION:  SDSC                         FACILITY:  [facility]  PHYSICIAN:  Hilda Lias, M.D.   DATE OF BIRTH:  April 26, 1950  DATE OF ADMISSION:  11/01/2010 DATE OF DISCHARGE:  11/01/2010                             HISTORY & PHYSICAL   Ms. Prada is a lady who underwent fusion at the level of L3-4, L4-5 several months ago.  Because of history of osteoporosis, the patient had bone stimulator.  The patient clinically was doing really well.  The x- ray showed fusion at the level of L3-4, L4-5 with no evidence of any _subluxation_________.  Nevertheless, the bone stimulator is uncomfortable to her. She feels as if it is rubbing against her skin and she wants the bone stimulator out.  PAST MEDICAL HISTORY:  Lumbar fusion.  ALLERGIES:  She is not allergic to any medication.  SOCIAL HISTORY:  Negative.  FAMILY HISTORY:  The patient is adopted.  REVIEW OF SYSTEMS:  She has a history of asthma, lumbar fusion, CSF, history of bipolar disorder.  PHYSICAL EXAMINATION:  GENERAL:  Normal. NECK:  Normal. LUNGS:  Clear. HEART:  Sounds normal. ABDOMEN:  Normal. EXTREMITIES:  Normal. NEUROLOGIC:  Normal. MUSCULOSKELETAL:  The scar is well-healed in the lumbar area.  There is some tenderness right at the level where the bone stimulator is located.  CLINICAL IMPRESSION:  Status post L3-4, L4-5 fusion with a bone stimulator.  RECOMMENDATION:  The patient will be admitted to have the bone stimulator out.  The procedure would be as outpatient procedure.  Most likely, the pain as I mentioned to Ms. Ladona Ridgel, in addition mild __________ giving her the discomfort.  Hopefully, Ms. Long can go home this afternoon.          ______________________________ Hilda Lias, M.D.     EB/MEDQ  D:  11/01/2010  T:  11/01/2010  Job:  [id number]  Electronically Signed by Hilda Lias M.D. on 11/04/2010  09:44:59 AM

## 2011-01-03 LAB — CBC
HCT: 39.2
HCT: 40.6
HCT: 44.2
HCT: 44.8
Hemoglobin: 13.3
Hemoglobin: 13.6
Hemoglobin: 15.1 — ABNORMAL HIGH
Hemoglobin: 15.1 — ABNORMAL HIGH
MCHC: 33.5
MCHC: 33.6
MCHC: 33.9
MCHC: 34.2
MCV: 89.4
MCV: 89.4
MCV: 90
MCV: 90.7
Platelets: 327
Platelets: 332
Platelets: 380
Platelets: 384
RBC: 4.38
RBC: 4.48
RBC: 4.94
RBC: 4.98
RDW: 12.3
RDW: 12.7
RDW: 12.7
RDW: 12.8
WBC: 10.4
WBC: 11.3 — ABNORMAL HIGH
WBC: 9.6
WBC: 9.8

## 2011-01-03 LAB — COMPREHENSIVE METABOLIC PANEL
ALT: 23
ALT: 36 — ABNORMAL HIGH
AST: 24
AST: 26
Albumin: 3.4 — ABNORMAL LOW
Albumin: 4.1
Alkaline Phosphatase: 60
Alkaline Phosphatase: 83
BUN: 12
BUN: 9
CO2: 24
CO2: 24
Calcium: 9.1
Calcium: 9.6
Chloride: 106
Chloride: 96
Creatinine, Ser: 0.78
Creatinine, Ser: 0.86
GFR calc Af Amer: >60
GFR calc Af Amer: >60
GFR calc non Af Amer: >60
GFR calc non Af Amer: >60
Glucose, Bld: 194 — ABNORMAL HIGH
Glucose, Bld: 199 — ABNORMAL HIGH
Potassium: 4.3
Potassium: 4.4
Sodium: 130 — ABNORMAL LOW
Sodium: 138
Total Bilirubin: 0.3
Total Bilirubin: 0.3
Total Protein: 5.9 — ABNORMAL LOW
Total Protein: 7

## 2011-01-03 LAB — BASIC METABOLIC PANEL
BUN: 12
CO2: 29
Calcium: 9
Chloride: 107
Creatinine, Ser: 0.66
GFR calc Af Amer: >60
GFR calc non Af Amer: >60
Glucose, Bld: 101 — ABNORMAL HIGH
Potassium: 3.7
Sodium: 140

## 2011-01-03 LAB — DIFFERENTIAL
Basophils Absolute: 0
Basophils Absolute: 0
Basophils Relative: 0
Basophils Relative: 0
Eosinophils Absolute: 0
Eosinophils Absolute: 0
Eosinophils Relative: 0
Eosinophils Relative: 0
Lymphocytes Relative: 11 — ABNORMAL LOW
Lymphocytes Relative: 6 — ABNORMAL LOW
Lymphs Abs: 0.6 — ABNORMAL LOW
Lymphs Abs: 1
Monocytes Absolute: 0 — ABNORMAL LOW
Monocytes Absolute: 0.4
Monocytes Relative: 0 — ABNORMAL LOW
Monocytes Relative: 4
Neutro Abs: 10.7 — ABNORMAL HIGH
Neutro Abs: 8.3 — ABNORMAL HIGH
Neutrophils Relative %: 85 — ABNORMAL HIGH
Neutrophils Relative %: 94 — ABNORMAL HIGH

## 2011-01-03 LAB — PROTIME-INR
INR: 1
Prothrombin Time: 12.8

## 2011-01-03 LAB — SEDIMENTATION RATE: Sed Rate: 3

## 2011-01-03 LAB — APTT: aPTT: 28

## 2011-01-03 LAB — IGE: IgE (Immunoglobulin E), Serum: 3.3

## 2011-01-03 LAB — TSH: TSH: 1.81

## 2011-05-29 ENCOUNTER — Other Ambulatory Visit: Payer: Self-pay | Admitting: Obstetrics and Gynecology

## 2011-05-30 ENCOUNTER — Other Ambulatory Visit: Payer: Self-pay

## 2011-06-26 ENCOUNTER — Encounter: Payer: Self-pay | Admitting: Gynecology

## 2011-06-26 DIAGNOSIS — G43909 Migraine, unspecified, not intractable, without status migrainosus: Secondary | ICD-10-CM | POA: Insufficient documentation

## 2011-06-26 DIAGNOSIS — B009 Herpesviral infection, unspecified: Secondary | ICD-10-CM | POA: Insufficient documentation

## 2011-06-28 ENCOUNTER — Other Ambulatory Visit: Payer: Self-pay | Admitting: Obstetrics and Gynecology

## 2011-07-04 ENCOUNTER — Ambulatory Visit (INDEPENDENT_AMBULATORY_CARE_PROVIDER_SITE_OTHER): Payer: BC Managed Care – PPO | Admitting: Obstetrics and Gynecology

## 2011-07-04 ENCOUNTER — Encounter: Payer: Self-pay | Admitting: Obstetrics and Gynecology

## 2011-07-04 VITALS — BP 120/74 | Ht 65.0 in | Wt 170.0 lb

## 2011-07-04 DIAGNOSIS — E039 Hypothyroidism, unspecified: Secondary | ICD-10-CM

## 2011-07-04 DIAGNOSIS — Z01419 Encounter for gynecological examination (general) (routine) without abnormal findings: Secondary | ICD-10-CM

## 2011-07-04 LAB — CBC WITH DIFFERENTIAL/PLATELET
Basophils Absolute: 0 10*3/uL (ref 0.0–0.1)
Basophils Relative: 0 % (ref 0–1)
Eosinophils Absolute: 0.2 10*3/uL (ref 0.0–0.7)
Eosinophils Relative: 2 % (ref 0–5)
HCT: 41.3 % (ref 36.0–46.0)
Hemoglobin: 13.4 g/dL (ref 12.0–15.0)
Lymphocytes Relative: 25 % (ref 12–46)
Lymphs Abs: 2.2 10*3/uL (ref 0.7–4.0)
MCH: 28.8 pg (ref 26.0–34.0)
MCHC: 32.4 g/dL (ref 30.0–36.0)
MCV: 88.8 fL (ref 78.0–100.0)
Monocytes Absolute: 0.5 10*3/uL (ref 0.1–1.0)
Monocytes Relative: 6 % (ref 3–12)
Neutro Abs: 5.8 10*3/uL (ref 1.7–7.7)
Neutrophils Relative %: 67 % (ref 43–77)
Platelets: 357 10*3/uL (ref 150–400)
RBC: 4.65 MIL/uL (ref 3.87–5.11)
RDW: 13.6 % (ref 11.5–15.5)
WBC: 8.7 10*3/uL (ref 4.0–10.5)

## 2011-07-04 LAB — TSH: TSH: 0.225 u[IU]/mL — ABNORMAL LOW (ref 0.350–4.500)

## 2011-07-04 MED ORDER — LEVOTHYROXINE SODIUM 137 MCG PO TABS: 137.0000 ug | ORAL_TABLET | Freq: Every day | ORAL | Status: DC

## 2011-07-04 NOTE — Progress Notes (Signed)
Patient came to see me today for her annual GYN exam. She is doing fine without hormone replacement. Her bone density showed low bone mass without an elevated FRAX risk. However she had a vertebral compression fracture which required neurosurgery and her neurosurgeon labeled  her as osteoporotic and wanted to be treated. She saw Dr. Patterson Hammersmith who gave her IV Reclast. She had a severe reaction with a flulike syndrome and a temperature of 106. She was in the emergency room for a day. She was not told to use Tylenol before and after treatment. She is having no vaginal bleeding. She is having no pelvic pain. We are treating patient for hypothyroidism with Synthroid and she is doing well.  Physical examination: Kennon Portela present. HEENT within normal limits. Neck: Thyroid not large. No masses. Supraclavicular nodes: not enlarged. Breasts: Examined in both sitting and lying  position. No skin changes and no masses. Abdomen: Soft no guarding rebound or masses or hernia. Pelvic: External: Within normal limits. BUS: Within normal limits. Vaginal:within normal limits. Poor  estrogen effect. No evidence of cystocele rectocele or enterocele. Cervix: clean. Uterus: Normal size and shape. Adnexa: No masses. Rectovaginal exam: Confirmatory and negative. Extremities: Within normal limits.  Assessment: #1. Atrophic vaginitis #2. Hypothyroidism #3. Vertebral compression fracture with associated diagnosis of osteoporosis  Plan: Mammogram. Bone density in July. TSH checked. Discussed continuing with IV Reclast explaining to  the patient that this might be a one-time reaction. Patient declined. She is very willing to take Prolia. I have asked her  to get me information from the neurosurgeon. We will then get her approved for Prolia after looking at her bone density.

## 2011-07-05 ENCOUNTER — Other Ambulatory Visit: Payer: Self-pay | Admitting: *Deleted

## 2011-07-05 DIAGNOSIS — R7989 Other specified abnormal findings of blood chemistry: Secondary | ICD-10-CM

## 2011-07-05 DIAGNOSIS — E7800 Pure hypercholesterolemia, unspecified: Secondary | ICD-10-CM

## 2011-07-05 LAB — LIPID PANEL
Cholesterol: 207 mg/dL — ABNORMAL HIGH (ref 0–200)
HDL: 55 mg/dL (ref >39)
LDL Cholesterol: 127 mg/dL — ABNORMAL HIGH (ref 0–99)
Total CHOL/HDL Ratio: 3.8 Ratio
Triglycerides: 125 mg/dL (ref <150)
VLDL: 25 mg/dL (ref 0–40)

## 2011-07-05 MED ORDER — LEVOTHYROXINE SODIUM 125 MCG PO TABS: 125.0000 ug | ORAL_TABLET | Freq: Every day | ORAL | Status: DC

## 2011-10-11 ENCOUNTER — Other Ambulatory Visit: Payer: Self-pay | Admitting: Obstetrics and Gynecology

## 2011-10-11 DIAGNOSIS — M810 Age-related osteoporosis without current pathological fracture: Secondary | ICD-10-CM

## 2011-10-25 ENCOUNTER — Ambulatory Visit (INDEPENDENT_AMBULATORY_CARE_PROVIDER_SITE_OTHER): Payer: BC Managed Care – PPO

## 2011-10-25 DIAGNOSIS — M810 Age-related osteoporosis without current pathological fracture: Secondary | ICD-10-CM

## 2011-10-31 ENCOUNTER — Ambulatory Visit (INDEPENDENT_AMBULATORY_CARE_PROVIDER_SITE_OTHER): Payer: BC Managed Care – PPO | Admitting: Obstetrics and Gynecology

## 2011-10-31 DIAGNOSIS — E039 Hypothyroidism, unspecified: Secondary | ICD-10-CM

## 2011-10-31 DIAGNOSIS — M810 Age-related osteoporosis without current pathological fracture: Secondary | ICD-10-CM

## 2011-10-31 NOTE — Progress Notes (Signed)
Patient came to see me today because of a bone density showing osteoporosis. She has lost significant in both her spine and hip. Last year she had laminectomies done by the neurosurgeon. His impression when he did the surgery was that she had osteoporosis. He had referred her at that point to the endocrinologist. Although  her most recent bone density showing  osteoporosis had not been done she was treated with IV Reclast based on the surgical findings. She ran a temperature of 106 after the Reclast and was hospitalized for short period of time. She has bad reflux requiring treatment. She then went ahead and had  a followup bone density with me in August of this year to decide about further treatment. We had a very long discussion today about how to treat her. I believe she is better off with non-oral medication due to her GERD. I told her that the IV Reclast probably could be repeated with pre-and post, tylenol  treatment and that she probably would do much better. We did however also discuss  Prolia and have decided to do that instead. We will get her approved. Appropriate lab work done.  Since I last saw her she's had additional lab work and told  me today that her cholesterol was much better and her TSH was normal. She will get me  copies of lab work. What is interesting however that although we called in a lower dose of Synthroid( 125 rather than one 137) the drug store is still given her the 137 and I think if her TSH is normal she does not need to be changed. Total time of interview was 30 minutes.

## 2011-10-31 NOTE — Patient Instructions (Signed)
We will get you  approved for Prolia and call you.

## 2011-11-01 LAB — PTH, INTACT AND CALCIUM
Calcium, Total (PTH): 9.4 mg/dL (ref 8.4–10.5)
PTH: 82.5 pg/mL — ABNORMAL HIGH (ref 14.0–72.0)

## 2011-11-01 LAB — VITAMIN D 25 HYDROXY (VIT D DEFICIENCY, FRACTURES): Vit D, 25-Hydroxy: 33 ng/mL (ref 30–89)

## 2011-11-05 ENCOUNTER — Encounter: Payer: Self-pay | Admitting: Obstetrics and Gynecology

## 2011-11-07 ENCOUNTER — Telehealth: Payer: Self-pay | Admitting: *Deleted

## 2011-11-07 ENCOUNTER — Other Ambulatory Visit: Payer: Self-pay | Admitting: *Deleted

## 2011-11-07 DIAGNOSIS — M810 Age-related osteoporosis without current pathological fracture: Secondary | ICD-10-CM

## 2011-11-07 MED ORDER — DENOSUMAB 60 MG/ML ~~LOC~~ SOLN
60.0000 mg | Freq: Once | SUBCUTANEOUS | Status: AC
  Administered 2011-11-09: 60 mg via SUBCUTANEOUS

## 2011-11-07 NOTE — Telephone Encounter (Signed)
Patient informed Prolia approved with insurance for a $30 copay for one year.  Will come for injection.

## 2011-11-09 ENCOUNTER — Ambulatory Visit (INDEPENDENT_AMBULATORY_CARE_PROVIDER_SITE_OTHER): Payer: BC Managed Care – PPO | Admitting: Gynecology

## 2011-11-09 DIAGNOSIS — M810 Age-related osteoporosis without current pathological fracture: Secondary | ICD-10-CM

## 2011-11-30 ENCOUNTER — Other Ambulatory Visit: Payer: Self-pay | Admitting: Obstetrics and Gynecology

## 2011-12-25 ENCOUNTER — Telehealth: Payer: Self-pay | Admitting: *Deleted

## 2011-12-25 MED ORDER — ACYCLOVIR 5 % EX OINT: TOPICAL_OINTMENT | CUTANEOUS | Status: DC

## 2011-12-25 NOTE — Telephone Encounter (Signed)
okay

## 2011-12-25 NOTE — Telephone Encounter (Signed)
Pt informed, rx sent 

## 2011-12-25 NOTE — Telephone Encounter (Signed)
Pt called c/o herpes outbreak she got refill on valtrex 500 mg tablets and took as directed, but outbreak still there. Pt is requesting a zovirax cream rather than pills. She states the cream worked much better. Okay to fill?

## 2011-12-25 NOTE — Addendum Note (Signed)
Addended by: Aura Camps on: 12/25/2011 10:55 AM   Modules accepted: Orders

## 2011-12-25 NOTE — Telephone Encounter (Signed)
Does she have herpes simplex or herpes zoster? Please come talk to me.

## 2011-12-25 NOTE — Telephone Encounter (Signed)
Pt has herpes simplex outbreak

## 2012-05-13 ENCOUNTER — Telehealth: Payer: Self-pay | Admitting: *Deleted

## 2012-05-13 NOTE — Telephone Encounter (Signed)
I called patient to let her know that it was time for her next Prolia injection. She wants to proceed. SHe has same insurance therefore I will check benefits and let her know then we can schedule. KW

## 2012-05-19 NOTE — Telephone Encounter (Signed)
Lm for pt to call back about Prolia. $30 copay approved for a year til 05/19/13. Authorization to be scanned in [employer]

## 2012-05-30 NOTE — Telephone Encounter (Signed)
Pt was out of town and now has been informed of benefits. She will Proceed with Prolia. Apt 3/18 at 230 for injection KW

## 2012-06-03 ENCOUNTER — Ambulatory Visit: Payer: Self-pay

## 2012-06-03 ENCOUNTER — Other Ambulatory Visit: Payer: Self-pay | Admitting: Obstetrics and Gynecology

## 2012-06-04 ENCOUNTER — Ambulatory Visit (INDEPENDENT_AMBULATORY_CARE_PROVIDER_SITE_OTHER): Payer: BC Managed Care – PPO | Admitting: *Deleted

## 2012-06-04 DIAGNOSIS — M810 Age-related osteoporosis without current pathological fracture: Secondary | ICD-10-CM

## 2012-06-04 MED ORDER — DENOSUMAB 60 MG/ML ~~LOC~~ SOLN
60.0000 mg | Freq: Once | SUBCUTANEOUS | Status: AC
  Administered 2012-06-04: 60 mg via SUBCUTANEOUS

## 2012-07-07 ENCOUNTER — Other Ambulatory Visit: Payer: Self-pay | Admitting: Obstetrics and Gynecology

## 2012-07-07 ENCOUNTER — Encounter: Payer: Self-pay | Admitting: *Deleted

## 2012-07-07 NOTE — Telephone Encounter (Signed)
I will ask appt desk to call her to schedule CE.

## 2012-07-17 ENCOUNTER — Encounter: Payer: Self-pay | Admitting: Women's Health

## 2012-07-17 ENCOUNTER — Other Ambulatory Visit (HOSPITAL_COMMUNITY)
Admission: RE | Admit: 2012-07-17 | Discharge: 2012-07-17 | Disposition: A | Discharge status: Home / Self Care | Payer: BC Managed Care – PPO | Source: Ambulatory Visit | Attending: Obstetrics and Gynecology | Admitting: Obstetrics and Gynecology

## 2012-07-17 ENCOUNTER — Encounter: Payer: Self-pay | Admitting: Obstetrics and Gynecology

## 2012-07-17 ENCOUNTER — Ambulatory Visit (INDEPENDENT_AMBULATORY_CARE_PROVIDER_SITE_OTHER): Payer: BC Managed Care – PPO | Admitting: Women's Health

## 2012-07-17 VITALS — BP 116/74 | Ht 65.5 in | Wt 168.5 lb

## 2012-07-17 DIAGNOSIS — B009 Herpesviral infection, unspecified: Secondary | ICD-10-CM

## 2012-07-17 DIAGNOSIS — E079 Disorder of thyroid, unspecified: Secondary | ICD-10-CM

## 2012-07-17 DIAGNOSIS — Z01419 Encounter for gynecological examination (general) (routine) without abnormal findings: Secondary | ICD-10-CM | POA: Insufficient documentation

## 2012-07-17 DIAGNOSIS — Z13220 Encounter for screening for lipoid disorders: Secondary | ICD-10-CM

## 2012-07-17 DIAGNOSIS — Z833 Family history of diabetes mellitus: Secondary | ICD-10-CM

## 2012-07-17 DIAGNOSIS — A609 Anogenital herpesviral infection, unspecified: Secondary | ICD-10-CM

## 2012-07-17 LAB — CBC WITH DIFFERENTIAL/PLATELET
Basophils Absolute: 0.1 10*3/uL (ref 0.0–0.1)
Basophils Relative: 1 % (ref 0–1)
Eosinophils Absolute: 0.2 10*3/uL (ref 0.0–0.7)
Eosinophils Relative: 4 % (ref 0–5)
HCT: 38.8 % (ref 36.0–46.0)
Hemoglobin: 13.1 g/dL (ref 12.0–15.0)
Lymphocytes Relative: 28 % (ref 12–46)
Lymphs Abs: 1.8 10*3/uL (ref 0.7–4.0)
MCH: 29.4 pg (ref 26.0–34.0)
MCHC: 33.8 g/dL (ref 30.0–36.0)
MCV: 87.2 fL (ref 78.0–100.0)
Monocytes Absolute: 0.7 10*3/uL (ref 0.1–1.0)
Monocytes Relative: 11 % (ref 3–12)
Neutro Abs: 3.5 10*3/uL (ref 1.7–7.7)
Neutrophils Relative %: 56 % (ref 43–77)
Platelets: 331 10*3/uL (ref 150–400)
RBC: 4.45 MIL/uL (ref 3.87–5.11)
RDW: 13.9 % (ref 11.5–15.5)
WBC: 6.2 10*3/uL (ref 4.0–10.5)

## 2012-07-17 LAB — LIPID PANEL
Cholesterol: 204 mg/dL — ABNORMAL HIGH (ref 0–200)
HDL: 54 mg/dL (ref >39)
LDL Cholesterol: 120 mg/dL — ABNORMAL HIGH (ref 0–99)
Total CHOL/HDL Ratio: 3.8 Ratio
Triglycerides: 151 mg/dL — ABNORMAL HIGH (ref <150)
VLDL: 30 mg/dL (ref 0–40)

## 2012-07-17 LAB — TSH: TSH: 3.069 u[IU]/mL (ref 0.350–4.500)

## 2012-07-17 LAB — GLUCOSE, RANDOM: Glucose, Bld: 103 mg/dL — ABNORMAL HIGH (ref 70–99)

## 2012-07-17 MED ORDER — LEVOTHYROXINE SODIUM 137 MCG PO TABS: ORAL_TABLET | ORAL | Status: DC

## 2012-07-17 MED ORDER — VALACYCLOVIR HCL 500 MG PO TABS: ORAL_TABLET | ORAL | Status: DC

## 2012-07-17 MED ORDER — HYDROCORTISONE ACE-PRAMOXINE 2.5-1 % RE CREA: TOPICAL_CREAM | Freq: Three times a day (TID) | RECTAL | Status: DC

## 2012-07-17 NOTE — Patient Instructions (Addendum)

## 2012-07-17 NOTE — Progress Notes (Signed)
LORELEE Levine 07-Nov-1950 [id number]    History:    The patient presents for annual exam.  Not currently sexually active. Chronic hemmorrhoids, occasional outbreaks of itching and blistering in anal area (nonpainful, attributes to HSV and treats with Valtrex). Normal Paps and mammograms. Never had colonoscopy. Osteoporosis and compression spine fractures. RECLAST with poor reaction-106 fever/ER visit. Prolia, 2 doses without problem. Dexa 8/13 T score -2.2 at Left femoral neck, FRAX 10%/1.6%. Vitamin D 33.     Past medical history, past surgical history, family history and social history were all reviewed and documented in the EPIC chart. D&C hysteroscopy 11/2008, surgical repair of compression fracture L spine 02/2010.  Hypothyroid, bipolar/depression, asthma, migraines. Adopted. Divorced. Does not work due to back pain (can't stand more than 20 mins). 90 year old daughter Victoria Levine lives in [place] with 2 granddaughters (90mo and 4 years).   ROS:  A  ROS was performed and pertinent positives and negatives are included in the history.  Exam:  Filed Vitals:   07/17/12 0917  BP: 116/74    General appearance:  Normal Head/Neck:  Normal, without cervical or supraclavicular adenopathy. Thyroid:  Symmetrical, normal in size, without palpable masses or nodularity. Respiratory  Effort:  Normal  Auscultation:  Clear without wheezing or rhonchi Cardiovascular  Auscultation:  Regular rate, without rubs, murmurs or gallops  Edema/varicosities:  Not grossly evident Abdominal  Soft,nontender, without masses, guarding or rebound.  Liver/spleen:  No organomegaly noted  Hernia:  None appreciated  Skin  Inspection:  Grossly normal  Palpation:  Grossly normal Neurologic/psychiatric  Orientation:  Normal with appropriate conversation.  Mood/affect:  Normal  Genitourinary    Breasts: Examined lying and sitting.     Right: Without masses, retractions, discharge or axillary  adenopathy.     Left: Without masses, retractions, discharge or axillary adenopathy.   Inguinal/mons:  Normal without inguinal adenopathy  External genitalia:  Normal  BUS/Urethra/Skene's glands:  Normal  Bladder:  Normal  Vagina:  Mild atrophy  Cervix:  Normal  Uterus:   normal in size, shape and contour.  Midline and mobile  Adnexa/parametria:     Rt: Without masses or tenderness.   Lt: Without masses or tenderness.  Anus and perineum: Several hemorrhoids, 2 cm  Digital rectal exam: Normal sphincter tone without palpated masses or tenderness  Assessment/Plan:  62 y.o. SWF G1P1 presents for annual exam.  Atrophic vaginitis/not currently sexually active Hypothyroidism Synthroid 137 mcg Hemorrhoids Osteoporosis-Prolia  Anxiety/bipolar-meds per psychiatrist Asthma/hypercholesterolemia -primary care manages meds, requested labs drawn here  Plan: Encouraged Zostravax, annual mammogram/normal 2012, colonoscopy. CBC, TSH, glucose, lipid panel, UA. SBE, calcium rich diet, vitamin D 200 mg daily. Encouraged regular exercise as tolerated with back pain. Hydrocortisone-pramoxine 2.5-1% rectal cream,  valtrex 500 mg prn, continue prolia twice yearly. Synthroid 137 mcg prescription, proper use given and reviewed.   Harrington Challenger [facility], 9:50 AM 07/17/2012

## 2012-07-18 LAB — URINALYSIS W MICROSCOPIC + REFLEX CULTURE
Bacteria, UA: NONE SEEN
Bilirubin Urine: NEGATIVE
Casts: NONE SEEN
Crystals: NONE SEEN
Glucose, UA: NEGATIVE mg/dL
Hgb urine dipstick: NEGATIVE
Ketones, ur: NEGATIVE mg/dL
Leukocytes, UA: NEGATIVE
Nitrite: NEGATIVE
Protein, ur: NEGATIVE mg/dL
Specific Gravity, Urine: 1.016 (ref 1.005–1.030)
Squamous Epithelial / LPF: NONE SEEN
Urobilinogen, UA: 0.2 mg/dL (ref 0.0–1.0)
pH: 7.5 (ref 5.0–8.0)

## 2012-07-21 ENCOUNTER — Other Ambulatory Visit: Payer: Self-pay | Admitting: Women's Health

## 2012-07-21 DIAGNOSIS — E039 Hypothyroidism, unspecified: Secondary | ICD-10-CM

## 2012-07-21 MED ORDER — LEVOTHYROXINE SODIUM 150 MCG PO TABS: 150.0000 ug | ORAL_TABLET | Freq: Every day | ORAL | Status: DC

## 2012-08-08 ENCOUNTER — Encounter: Payer: Self-pay | Admitting: Women's Health

## 2012-08-18 ENCOUNTER — Encounter: Payer: Self-pay | Admitting: Women's Health

## 2012-10-08 ENCOUNTER — Ambulatory Visit (INDEPENDENT_AMBULATORY_CARE_PROVIDER_SITE_OTHER): Payer: BC Managed Care – PPO | Admitting: Women's Health

## 2012-10-08 DIAGNOSIS — E039 Hypothyroidism, unspecified: Secondary | ICD-10-CM

## 2012-10-08 MED ORDER — LEVOTHYROXINE SODIUM 137 MCG PO TABS: 137.0000 ug | ORAL_TABLET | Freq: Every day | ORAL | Status: DC

## 2012-10-08 NOTE — Patient Instructions (Addendum)
Osteoporosis Throughout your life, your body breaks down old bone and replaces it with new bone. As you get older, your body does not replace bone as quickly as it breaks it down. By the age of 30 years, most people begin to gradually lose bone because of the imbalance between bone loss and replacement. Some people lose more bone than others. Bone loss beyond a specified normal degree is considered osteoporosis.  Osteoporosis affects the strength and durability of your bones. The inside of the ends of your bones and your flat bones, like the bones of your pelvis, look like honeycomb, filled with tiny open spaces. As bone loss occurs, your bones become less dense. This means that the open spaces inside your bones become bigger and the walls between these spaces become thinner. This makes your bones weaker. Bones of a person with osteoporosis can become so weak that they can break (fracture) during minor accidents, such as a simple fall. CAUSES  The following factors have been associated with the development of osteoporosis:  Smoking.  Drinking more than 2 alcoholic drinks several days per week.  Long-term use of certain medicines:  Corticosteroids.  Chemotherapy medicines.  Thyroid medicines.  Antiepileptic medicines.  Gonadal hormone suppression medicine.  Immunosuppression medicine.  Being underweight.  Lack of physical activity.  Lack of exposure to the sun. This can lead to vitamin D deficiency.  Certain medical conditions:  Certain inflammatory bowel diseases, such as Crohn's disease and ulcerative colitis.  Diabetes.  Hyperthyroidism.  Hyperparathyroidism. RISK FACTORS Anyone can develop osteoporosis. However, the following factors can increase your risk of developing osteoporosis:  Gender Women are at higher risk than men.  Age Being older than 50 years increases your risk.  Ethnicity White and Asian people have an increased risk.  Weight Being extremely  underweight can increase your risk of osteoporosis.  Family history of osteoporosis Having a family member who has developed osteoporosis can increase your risk. SYMPTOMS  Usually, people with osteoporosis have no symptoms.  DIAGNOSIS  Signs during a physical exam that may prompt your caregiver to suspect osteoporosis include:  Decreased height. This is usually caused by the compression of the bones that form your spine (vertebrae) because they have weakened and become fractured.  A curving or rounding of the upper back (kyphosis). To confirm signs of osteoporosis, your caregiver may request a procedure that uses 2 low-dose X-ray beams with different levels of energy to measure your bone mineral density (dual-energy X-ray absorptiometry [DXA]). Also, your caregiver may check your level of vitamin D. TREATMENT  The goal of osteoporosis treatment is to strengthen bones in order to decrease the risk of bone fractures. There are different types of medicines available to help achieve this goal. Some of these medicines work by slowing the processes of bone loss. Some medicines work by increasing bone density. Treatment also involves making sure that your levels of calcium and vitamin D are adequate. PREVENTION  There are things you can do to help prevent osteoporosis. Adequate intake of calcium and vitamin D can help you achieve optimal bone mineral density. Regular exercise can also help, especially resistance and weight-bearing activities. If you smoke, quitting smoking is an important part of osteoporosis prevention. MAKE SURE YOU:  Understand these instructions.  Will watch your condition.  Will get help right away if you are not doing well or get worse. Document Released: 12/13/2004 Document Revised: 02/20/2012 Document Reviewed: 02/17/2011 [facility] Patient Information 2014 [place], [place]. Hypothyroidism The thyroid is a large  gland located in the lower front of your neck. The thyroid gland  helps control metabolism. Metabolism is how your body handles food. It controls metabolism with the hormone thyroxine. When this gland is underactive (hypothyroid), it produces too little hormone.  CAUSES These include:   Absence or destruction of thyroid tissue.  Goiter due to iodine deficiency.  Goiter due to medications.  Congenital defects (since birth).  Problems with the pituitary. This causes a lack of TSH (thyroid stimulating hormone). This hormone tells the thyroid to turn out more hormone. SYMPTOMS  Lethargy (feeling as though you have no energy)  Cold intolerance  Weight gain (in spite of normal food intake)  Dry skin  Coarse hair  Menstrual irregularity (if severe, may lead to infertility)  Slowing of thought processes Cardiac problems are also caused by insufficient amounts of thyroid hormone. Hypothyroidism in the newborn is cretinism, and is an extreme form. It is important that this form be treated adequately and immediately or it will lead rapidly to retarded physical and mental development. DIAGNOSIS  To prove hypothyroidism, your caregiver may do blood tests and ultrasound tests. Sometimes the signs are hidden. It may be necessary for your caregiver to watch this illness with blood tests either before or after diagnosis and treatment. TREATMENT  Low levels of thyroid hormone are increased by using synthetic thyroid hormone. This is a safe, effective treatment. It usually takes about four weeks to gain the full effects of the medication. After you have the full effect of the medication, it will generally take another four weeks for problems to leave. Your caregiver may start you on low doses. If you have had heart problems the dose may be gradually increased. It is generally not an emergency to get rapidly to normal. HOME CARE INSTRUCTIONS   Take your medications as your caregiver suggests. Let your caregiver know of any medications you are taking or start taking.  Your caregiver will help you with dosage schedules.  As your condition improves, your dosage needs may increase. It will be necessary to have continuing blood tests as suggested by your caregiver.  Report all suspected medication side effects to your caregiver. SEEK MEDICAL CARE IF: Seek medical care if you develop:  Sweating.  Tremulousness (tremors).  Anxiety.  Rapid weight loss.  Heat intolerance.  Emotional swings.  Diarrhea.  Weakness. SEEK IMMEDIATE MEDICAL CARE IF:  You develop chest pain, an irregular heart beat (palpitations), or a rapid heart beat. MAKE SURE YOU:   Understand these instructions.  Will watch your condition.  Will get help right away if you are not doing well or get worse. Document Released: 03/05/2005 Document Revised: 05/28/2011 Document Reviewed: 10/24/2007 [facility] Patient Information 2014 [place], [place].

## 2012-10-08 NOTE — Progress Notes (Signed)
Patient ID: Victoria Levine, female   DOB: 11-08-1950, 62 y.o.   MRN: [id number] Presents to discuss labs that were done at health screening for work insurance and thyroid. TSH was 3.69 at annual exam on Synthroid 137, requested Synthroid to be increased so that TSH would be closer or less than 1 where she feels her best, 2 months later at recheck TSH now 0.163, will take Synthroid 137 Monday through Friday and 150 mcg weekends only. History of alcoholism, no drinking for 20 years, now struggles more with bipolar disease.  Hypothyroid  Plan: Reviewed sodium slightly low, potassium normal and other electrolytes normal. Lipid panel slightly better, reviewed importance of regular exercise and low-fat diet. Blood sugar had been 110 but with recheck was at 81. Reviewed normal. History of bipolar, continues to see counselor. Reviewed can add small amount of salt to foods. Recheck TSH in 2 months.

## 2012-11-06 ENCOUNTER — Other Ambulatory Visit: Payer: Self-pay | Admitting: Women's Health

## 2012-12-04 ENCOUNTER — Telehealth: Payer: Self-pay | Admitting: *Deleted

## 2012-12-04 DIAGNOSIS — E871 Hypo-osmolality and hyponatremia: Secondary | ICD-10-CM

## 2012-12-04 NOTE — Telephone Encounter (Signed)
Pt informed time for Prolia. Injection tomorrow at [employer], [place] on file good til 05/19/13. She also needs labwork TSH and BMP orders placed. KW

## 2012-12-05 ENCOUNTER — Ambulatory Visit (INDEPENDENT_AMBULATORY_CARE_PROVIDER_SITE_OTHER): Payer: BC Managed Care – PPO | Admitting: *Deleted

## 2012-12-05 DIAGNOSIS — M810 Age-related osteoporosis without current pathological fracture: Secondary | ICD-10-CM

## 2012-12-05 DIAGNOSIS — E039 Hypothyroidism, unspecified: Secondary | ICD-10-CM

## 2012-12-05 DIAGNOSIS — E871 Hypo-osmolality and hyponatremia: Secondary | ICD-10-CM

## 2012-12-05 LAB — BASIC METABOLIC PANEL
BUN: 13 mg/dL (ref 6–23)
CO2: 32 mEq/L (ref 19–32)
Calcium: 9.7 mg/dL (ref 8.4–10.5)
Chloride: 92 mEq/L — ABNORMAL LOW (ref 96–112)
Creat: 0.67 mg/dL (ref 0.50–1.10)
Glucose, Bld: 81 mg/dL (ref 70–99)
Potassium: 4.2 mEq/L (ref 3.5–5.3)
Sodium: 127 mEq/L — ABNORMAL LOW (ref 135–145)

## 2012-12-05 MED ORDER — DENOSUMAB 60 MG/ML ~~LOC~~ SOLN
60.0000 mg | Freq: Once | SUBCUTANEOUS | Status: AC
  Administered 2012-12-05: 60 mg via SUBCUTANEOUS

## 2012-12-06 LAB — TSH: TSH: 0.134 u[IU]/mL — ABNORMAL LOW (ref 0.350–4.500)

## 2012-12-09 ENCOUNTER — Other Ambulatory Visit: Payer: Self-pay | Admitting: *Deleted

## 2012-12-09 DIAGNOSIS — E039 Hypothyroidism, unspecified: Secondary | ICD-10-CM

## 2012-12-29 ENCOUNTER — Other Ambulatory Visit: Payer: Self-pay | Admitting: Women's Health

## 2013-02-09 ENCOUNTER — Other Ambulatory Visit: Payer: BC Managed Care – PPO

## 2013-02-09 DIAGNOSIS — E039 Hypothyroidism, unspecified: Secondary | ICD-10-CM

## 2013-02-10 LAB — TSH: TSH: 0.098 u[IU]/mL — ABNORMAL LOW (ref 0.350–4.500)

## 2013-02-16 ENCOUNTER — Other Ambulatory Visit: Payer: Self-pay | Admitting: Women's Health

## 2013-02-16 DIAGNOSIS — R7989 Other specified abnormal findings of blood chemistry: Secondary | ICD-10-CM

## 2013-02-16 MED ORDER — LEVOTHYROXINE SODIUM 125 MCG PO TABS: 125.0000 ug | ORAL_TABLET | Freq: Every day | ORAL | Status: DC

## 2013-02-24 ENCOUNTER — Telehealth: Payer: Self-pay | Admitting: *Deleted

## 2013-02-24 DIAGNOSIS — E039 Hypothyroidism, unspecified: Secondary | ICD-10-CM

## 2013-02-24 NOTE — Telephone Encounter (Signed)
Order placed for thyroid panel, pt informed with all the below.

## 2013-02-24 NOTE — Telephone Encounter (Signed)
Sorry I misread the note. Explain to patient that when TSH is low she needs less Synthroid , when the TSH is higher than normal needs more Synthroid. If we increased her Synthroid dosage her TSH will remain too low. Best to keep at 125 for at least a month and recheck. We could also check a full thyroid panel with next TSH check  if patient has more questions I will call.

## 2013-02-24 NOTE — Telephone Encounter (Signed)
No. It is best to have TSH between one and 2. Currently on  125 mcg, needs to decrease to 112 Synthroid daily. Make sure she is taking it at the proper time first thing in the morning without eating or drinking. I can call her if she has questions. Let me know.

## 2013-02-24 NOTE — Telephone Encounter (Signed)
So you want pt to decrease from 125 mcg daily to 112 mcg daily now? Pt hasn't came back to recheck yet. Let me know

## 2013-02-24 NOTE — Telephone Encounter (Signed)
Pt is currently taking synthroid 125 mcg daily was on 137 mcg daily told to return to 2 months to recheck level on 02/09/13. Pt is c/o feeling cold, fatigue, skin dryness. Pt asked if a Rx for synthroid 137 mcg could be sent to pharmacy and she could alternate taking the 125 and 137 every other day? Please advise

## 2013-04-13 ENCOUNTER — Other Ambulatory Visit: Payer: BC Managed Care – PPO

## 2013-04-13 DIAGNOSIS — E039 Hypothyroidism, unspecified: Secondary | ICD-10-CM

## 2013-04-13 DIAGNOSIS — R7989 Other specified abnormal findings of blood chemistry: Secondary | ICD-10-CM

## 2013-04-14 LAB — THYROID PANEL WITH TSH
Free Thyroxine Index: 3.1 (ref 1.0–3.9)
T3 Uptake: 43.7 % — ABNORMAL HIGH (ref 22.5–37.0)
T4, Total: 7.2 ug/dL (ref 5.0–12.5)
TSH: 0.649 u[IU]/mL (ref 0.350–4.500)

## 2013-04-15 ENCOUNTER — Other Ambulatory Visit: Payer: Self-pay | Admitting: Women's Health

## 2013-04-15 MED ORDER — LEVOTHYROXINE SODIUM 125 MCG PO TABS: 125.0000 ug | ORAL_TABLET | Freq: Every day | ORAL | Status: DC

## 2013-06-12 ENCOUNTER — Telehealth: Payer: Self-pay | Admitting: *Deleted

## 2013-06-12 NOTE — Telephone Encounter (Signed)
Pt Due for Insurance after 3/16, Medicare as of April 2015. [place] approved, [id number] under [employer] . Will fax letter to be scanned. Injection covered as 100% APt 3/30 at [place]

## 2013-06-15 ENCOUNTER — Ambulatory Visit (INDEPENDENT_AMBULATORY_CARE_PROVIDER_SITE_OTHER): Payer: BC Managed Care – PPO | Admitting: *Deleted

## 2013-06-15 DIAGNOSIS — M810 Age-related osteoporosis without current pathological fracture: Secondary | ICD-10-CM

## 2013-06-15 MED ORDER — DENOSUMAB 60 MG/ML ~~LOC~~ SOLN
60.0000 mg | Freq: Once | SUBCUTANEOUS | Status: AC
  Administered 2013-06-15: 60 mg via SUBCUTANEOUS

## 2013-07-24 ENCOUNTER — Other Ambulatory Visit: Payer: Self-pay | Admitting: Women's Health

## 2013-07-28 ENCOUNTER — Other Ambulatory Visit: Payer: Self-pay | Admitting: Women's Health

## 2013-07-28 ENCOUNTER — Other Ambulatory Visit: Payer: Self-pay

## 2013-07-28 ENCOUNTER — Telehealth: Payer: Self-pay

## 2013-07-28 MED ORDER — LEVOTHYROXINE SODIUM 125 MCG PO TABS: 125.0000 ug | ORAL_TABLET | Freq: Every day | ORAL | Status: DC

## 2013-07-28 NOTE — Telephone Encounter (Signed)
I denied her refill for generic Synthroid because and put note on it for her to call the office.  Your last note on her January TSH said return in two months for recheck on TSH. Patient was advised but states now that she did not realize she needed to return. She is asking if she needs to return for lab and if she can get refill in meantime if she does.  She has been out of medication x 2 days.  Her request was for the 125 mcg tablet.

## 2013-07-28 NOTE — Telephone Encounter (Signed)
Patient advised. She has taken her 150 mcg the last two days so she will get her Rx and take the 125 for the rest of the week . Lab appt made for Friday to have TSH rechecked. Order put in.

## 2013-07-28 NOTE — Telephone Encounter (Signed)
Okay for refill on Synthroid 125 #30 with no refills and have her come to office end of this  week for TSH.

## 2013-07-31 ENCOUNTER — Other Ambulatory Visit: Payer: BC Managed Care – PPO

## 2013-08-07 ENCOUNTER — Other Ambulatory Visit: Payer: BC Managed Care – PPO

## 2013-08-14 ENCOUNTER — Other Ambulatory Visit: Payer: Medicare Other

## 2013-08-14 ENCOUNTER — Other Ambulatory Visit: Payer: Self-pay | Admitting: Women's Health

## 2013-08-14 DIAGNOSIS — E039 Hypothyroidism, unspecified: Secondary | ICD-10-CM

## 2013-08-14 MED ORDER — LEVOTHYROXINE SODIUM 125 MCG PO TABS: 125.0000 ug | ORAL_TABLET | Freq: Every day | ORAL | Status: DC

## 2013-08-15 LAB — TSH: TSH: 0.937 u[IU]/mL (ref 0.350–4.500)

## 2013-08-23 ENCOUNTER — Other Ambulatory Visit: Payer: Self-pay | Admitting: Women's Health

## 2013-09-22 ENCOUNTER — Other Ambulatory Visit: Payer: Self-pay | Admitting: Women's Health

## 2013-10-23 ENCOUNTER — Other Ambulatory Visit: Payer: Self-pay | Admitting: Women's Health

## 2013-10-26 ENCOUNTER — Telehealth: Payer: Self-pay | Admitting: *Deleted

## 2013-10-26 MED ORDER — LEVOTHYROXINE SODIUM 125 MCG PO TABS: 125.0000 ug | ORAL_TABLET | Freq: Every day | ORAL | Status: DC

## 2013-10-26 NOTE — Telephone Encounter (Signed)
Pt Rx for synthroid was denied because she is overdue for annual. Pt now has annual scheduled on 11/17/13. 30 day supply will be sent

## 2013-11-12 ENCOUNTER — Encounter (HOSPITAL_COMMUNITY): Payer: Self-pay | Admitting: Emergency Medicine

## 2013-11-12 ENCOUNTER — Emergency Department (HOSPITAL_COMMUNITY): Payer: Medicare Other

## 2013-11-12 ENCOUNTER — Emergency Department (HOSPITAL_COMMUNITY)
Admission: EM | Admit: 2013-11-12 | Discharge: 2013-11-12 | Disposition: A | Discharge status: Home / Self Care | Payer: Medicare Other | Attending: Emergency Medicine | Admitting: Emergency Medicine

## 2013-11-12 DIAGNOSIS — Z8719 Personal history of other diseases of the digestive system: Secondary | ICD-10-CM | POA: Insufficient documentation

## 2013-11-12 DIAGNOSIS — Z8739 Personal history of other diseases of the musculoskeletal system and connective tissue: Secondary | ICD-10-CM | POA: Diagnosis not present

## 2013-11-12 DIAGNOSIS — R197 Diarrhea, unspecified: Secondary | ICD-10-CM | POA: Diagnosis not present

## 2013-11-12 DIAGNOSIS — J45909 Unspecified asthma, uncomplicated: Secondary | ICD-10-CM | POA: Insufficient documentation

## 2013-11-12 DIAGNOSIS — G43909 Migraine, unspecified, not intractable, without status migrainosus: Secondary | ICD-10-CM | POA: Diagnosis not present

## 2013-11-12 DIAGNOSIS — Y9389 Activity, other specified: Secondary | ICD-10-CM | POA: Insufficient documentation

## 2013-11-12 DIAGNOSIS — W1809XA Striking against other object with subsequent fall, initial encounter: Secondary | ICD-10-CM | POA: Insufficient documentation

## 2013-11-12 DIAGNOSIS — Z9889 Other specified postprocedural states: Secondary | ICD-10-CM | POA: Insufficient documentation

## 2013-11-12 DIAGNOSIS — N390 Urinary tract infection, site not specified: Secondary | ICD-10-CM | POA: Insufficient documentation

## 2013-11-12 DIAGNOSIS — F0781 Postconcussional syndrome: Secondary | ICD-10-CM | POA: Diagnosis not present

## 2013-11-12 DIAGNOSIS — R1110 Vomiting, unspecified: Secondary | ICD-10-CM | POA: Diagnosis present

## 2013-11-12 DIAGNOSIS — Y9289 Other specified places as the place of occurrence of the external cause: Secondary | ICD-10-CM | POA: Insufficient documentation

## 2013-11-12 DIAGNOSIS — Z79899 Other long term (current) drug therapy: Secondary | ICD-10-CM | POA: Diagnosis not present

## 2013-11-12 DIAGNOSIS — B009 Herpesviral infection, unspecified: Secondary | ICD-10-CM | POA: Insufficient documentation

## 2013-11-12 DIAGNOSIS — S0990XA Unspecified injury of head, initial encounter: Secondary | ICD-10-CM | POA: Diagnosis not present

## 2013-11-12 DIAGNOSIS — E039 Hypothyroidism, unspecified: Secondary | ICD-10-CM | POA: Insufficient documentation

## 2013-11-12 DIAGNOSIS — F319 Bipolar disorder, unspecified: Secondary | ICD-10-CM | POA: Insufficient documentation

## 2013-11-12 LAB — URINALYSIS, ROUTINE W REFLEX MICROSCOPIC
Bilirubin Urine: NEGATIVE
Glucose, UA: NEGATIVE mg/dL
Ketones, ur: 40 mg/dL — AB
Leukocytes, UA: NEGATIVE
Nitrite: POSITIVE — AB
Protein, ur: NEGATIVE mg/dL
Specific Gravity, Urine: 1.018 (ref 1.005–1.030)
Urobilinogen, UA: 0.2 mg/dL (ref 0.0–1.0)
pH: 6.5 (ref 5.0–8.0)

## 2013-11-12 LAB — URINE MICROSCOPIC-ADD ON

## 2013-11-12 MED ORDER — CEPHALEXIN 500 MG PO CAPS: 500.0000 mg | ORAL_CAPSULE | Freq: Four times a day (QID) | ORAL | Status: DC

## 2013-11-12 MED ORDER — OXYCODONE HCL 5 MG PO TABS
15.0000 mg | ORAL_TABLET | Freq: Once | ORAL | Status: DC
  Filled 2013-11-12: qty 3

## 2013-11-12 MED ORDER — CEPHALEXIN 500 MG PO CAPS
500.0000 mg | ORAL_CAPSULE | Freq: Once | ORAL | Status: AC
  Administered 2013-11-12: 500 mg via ORAL
  Filled 2013-11-12: qty 1

## 2013-11-12 NOTE — ED Notes (Addendum)
Pt sts she fell last night and blacked out. Pt sts she doesn't remember the fall, nut she is able to recall hitting her head to the RN. Pt sts since the fall she has been nauseous and vomiting. Pt sts that right now she is most concerned with the pain in her back. Pt has hx of chronic pain and sts that she hasn't taken any of her oxycodone all day due to possible head injury. Pt A&Ox4.

## 2013-11-12 NOTE — ED Provider Notes (Signed)
CSN: [id number]     Arrival date & time 11/12/13  1920 History   First MD Initiated Contact with Patient 11/12/13 2012     Chief Complaint  Patient presents with  . Fall  . Emesis  . Diarrhea     (Consider location/radiation/quality/duration/timing/severity/associated sxs/prior Treatment) HPI  72yF presenting after fall. Got feet tangled up in small pet and lost balance. Happened last night. Nausea and did vomit last night but currently feels a little better in regards to this. She is not sure if she hit her head but is concerned she did. Denies HA but feels "foggy" and out of sorts. No acute visual complaints. No acute numbness, tingling or loss of strength. No neck or back pain. No blood thinners.   Past Medical History  Diagnosis Date  . Asthma   . Bipolar disorder   . Depression   . DDD (degenerative disc disease)   . Hiatal hernia   . Osteoporosis   . S/P adenoidectomy   . S/P tonsillectomy   . Hypothyroid   . Migraines   . Herpes    Past Surgical History  Procedure Laterality Date  . Cesarean section    . Dilation and curettage of uterus    . Hysteroscopy    . Compression fracture spine    . Lumbar fusion     Family History  Problem Relation Age of Onset  . Adopted: Yes   History  Substance Use Topics  . Smoking status: Never Smoker   . Smokeless tobacco: Not on file  . Alcohol Use: No   OB History   Grav Para Term Preterm Abortions TAB SAB Ect Mult Living   3 1 1  2     1      Review of Systems  All systems reviewed and negative, other than as noted in HPI.   Allergies  Lithium carbonate; Prednisone; and Sulfa antibiotics  Home Medications   Prior to Admission medications   Medication Sig Start Date End Date Taking? Authorizing Provider  acyclovir (ZOVIRAX) 400 MG tablet Take 400 mg by mouth every 4 (four) hours as needed (outbreak).    Yes Historical Provider, MD  acyclovir ointment (ZOVIRAX) 5 % Apply topically every 3 (three) hours. 12/25/11   Yes Bennetta Laos, MD  ALPRAZolam Duanne Moron) 0.5 MG tablet Take 0.5 mg by mouth at bedtime as needed for sleep.    Yes Historical Provider, MD  Azelastine HCl (ASTEPRO NA) Place 1 spray into the nose daily.    Yes Historical Provider, MD  beclomethasone (QVAR) 80 MCG/ACT inhaler Inhale 1 puff into the lungs as needed (sob).    Yes Historical Provider, MD  Budesonide-Formoterol Fumarate (SYMBICORT IN) Inhale 1 puff into the lungs daily.    Yes Historical Provider, MD  buPROPion (WELLBUTRIN XL) 300 MG 24 hr tablet Take 300 mg by mouth daily.   Yes Historical Provider, MD  Calcium Carbonate-Vitamin D (CALCIUM + D PO) Take 1 tablet by mouth daily.    Yes Historical Provider, MD  FLUoxetine (PROZAC) 20 MG capsule Take 20 mg by mouth 2 (two) times daily.   Yes Historical Provider, MD  levothyroxine (SYNTHROID) 125 MCG tablet Take 1 tablet (125 mcg total) by mouth daily before breakfast. 08/14/13  Yes [name], NP  Multiple Vitamin (MULTIVITAMIN) tablet Take 1 tablet by mouth daily.   Yes Historical Provider, MD  OXcarbazepine (TRILEPTAL PO) Take 1 tablet by mouth 2 (two) times daily.    Yes Historical Provider, MD  oxyCODONE (OXYCONTIN) 15 MG TB12 Take 15 mg by mouth every 12 (twelve) hours.   Yes Historical Provider, MD  pantoprazole (PROTONIX) 40 MG tablet Take 40 mg by mouth daily.   Yes Historical Provider, MD  tiotropium (SPIRIVA) 18 MCG inhalation capsule Place 18 mcg into inhaler and inhale daily.   Yes Historical Provider, MD  valACYclovir (VALTREX) 500 MG tablet Take 500 mg by mouth 2 (two) times daily. For 5 days then 1 tablet daily   Yes Historical Provider, MD   BP 107/65  Pulse 85  Temp(Src) 100.8 F (38.2 C) (Oral)  Resp 20  Ht 5\' 6"  (1.676 m)  Wt 160 lb (72.576 kg)  BMI 25.84 kg/m2  SpO2 93% Physical Exam  Nursing note and vitals reviewed. Constitutional: She is oriented to person, place, and time. She appears well-developed and well-nourished. No distress.  HENT:  Head:  Normocephalic and atraumatic.  Mouth/Throat: Oropharynx is clear and moist.  No external signs of acute head trauma.   Eyes: Conjunctivae and EOM are normal. Right eye exhibits no discharge. Left eye exhibits no discharge.  Neck: Neck supple.  Cardiovascular: Normal rate, regular rhythm and normal heart sounds.  Exam reveals no gallop and no friction rub.   No murmur heard. Pulmonary/Chest: Effort normal and breath sounds normal. No respiratory distress.  Abdominal: Soft. She exhibits no distension. There is no tenderness.  Musculoskeletal: She exhibits no edema and no tenderness.  No midline spinal tenderness.   Neurological: She is alert and oriented to person, place, and time. No cranial nerve deficit. She exhibits normal muscle tone. Coordination normal.  Speech clear. Content appropriate. Follows commands. CN intact. Strength 5/5 b/l u/l extremities.   Skin: Skin is warm and dry.  Psychiatric: She has a normal mood and affect. Her behavior is normal. Thought content normal.    ED Course  Procedures (including critical care time) Labs Review Labs Reviewed  URINALYSIS, ROUTINE W REFLEX MICROSCOPIC - Abnormal; Notable for the following:    APPearance CLOUDY (*)    Hgb urine dipstick MODERATE (*)    Ketones, ur 40 (*)    Nitrite POSITIVE (*)    All other components within normal limits  URINE MICROSCOPIC-ADD ON - Abnormal; Notable for the following:    Bacteria, UA MANY (*)    All other components within normal limits  URINE CULTURE    Imaging Review No results found.  Ct Head Wo Contrast  11/12/2013   CLINICAL DATA:  Fall, struck head.  EXAM: CT HEAD WITHOUT CONTRAST  TECHNIQUE: Contiguous axial images were obtained from the base of the skull through the vertex without intravenous contrast.  COMPARISON:  10/05/2010  FINDINGS: Mild cerebral atrophy. Calcified extra-axial lesion along the left side of the posterior falx consistent with meningioma. No change. No mass effect or  midline shift. No abnormal extra-axial fluid collections. Gray-white matter junctions are distinct. Basal cisterns are not effaced. No evidence of acute intracranial hemorrhage. No depressed skull fractures. Visualized paranasal sinuses and mastoid air cells are not opacified.  IMPRESSION: No acute intracranial abnormalities. Stable appearance of left posterior parafalcine meningioma.   Electronically Signed   By: Lucienne Capers M.D.   On: 11/12/2013 23:09    EKG Interpretation None      MDM   Final diagnoses:  Post concussion syndrome  UTI (lower urinary tract infection)    62yF s/p mechanical fall. "Feeling foggy" since. Likely multifactorial. Possibly some element of post concussive syndrome. Also noted to be febrile. UA  consistent with UTI. Likely contributing as well. She does not appear ill. Neuro exam is nonfocal. She would like to go home. I think outpt tx is reasonable at this time. Return precautions discussed.     Virgel Manifold, MD 11/18/13 [phone]

## 2013-11-12 NOTE — ED Notes (Signed)
Per [facility]- pt from home w/ unwitnessed fall yesterday evening approx 2200 unsure of possible LOC? - pt states s/p fall she began experiencing n/v/d. Pt reports hitting her head, no injury noted by EMS - neurologically intact for EMS, small abrasion to rt shoulder. NSR on cardiac monitor.

## 2013-11-12 NOTE — Discharge Instructions (Signed)

## 2013-11-12 NOTE — ED Notes (Signed)
Bed: WA07 Expected date:  Expected time:  Means of arrival:  Comments: Fall, syncope

## 2013-11-12 NOTE — ED Notes (Signed)
Pt placed on 2L O2. Pt oxygen dropping to 88% when sleeping.

## 2013-11-12 NOTE — ED Notes (Signed)
Pt urinated on a chuck in bed. New chuck placed under pt by NT.

## 2013-11-12 NOTE — ED Notes (Signed)
Patient transported to CT 

## 2013-11-15 LAB — URINE CULTURE: Colony Count: >=100000

## 2013-11-16 ENCOUNTER — Telehealth (HOSPITAL_BASED_OUTPATIENT_CLINIC_OR_DEPARTMENT_OTHER): Payer: Self-pay | Admitting: Emergency Medicine

## 2013-11-16 NOTE — Progress Notes (Signed)
ED Antimicrobial Stewardship Positive Culture Follow Up   Victoria Levine is an 63 y.o. female who presented to [facility] on 11/12/2013 with a chief complaint of  Chief Complaint  Patient presents with  . Fall  . Emesis  . Diarrhea    Recent Results (from the past 720 hour(s))  URINE CULTURE     Status: None   Collection Time    11/12/13 11:12 PM      Result Value Ref Range Status   Specimen Description URINE, CLEAN CATCH   Final   Special Requests NONE   Final   Culture  Setup Time     Final   Value: 11/13/2013 01:08     Performed at [place]     Final   Value: >=100,000 COLONIES/ML     Performed at [facility]   Culture     Final   Value: ESCHERICHIA COLI     Performed at [facility]   Report Status 11/15/2013 FINAL   Final   Organism ID, Bacteria ESCHERICHIA COLI   Final    [x]  Treated with cephalexin but, organism resistant to prescribed antimicrobial.  New antibiotic prescription: Ciprofloxacin 500 mg PO BID x 7 days.  Fax results to [facility].   ED Provider: Al Corpus, PA-C  Horatio Pel 11/16/2013, 4:58 PM Infectious Diseases Pharmacist Phone# [phone]

## 2013-11-17 ENCOUNTER — Encounter: Payer: Self-pay | Admitting: Women's Health

## 2013-11-17 ENCOUNTER — Ambulatory Visit (INDEPENDENT_AMBULATORY_CARE_PROVIDER_SITE_OTHER): Payer: Medicare Other | Admitting: Women's Health

## 2013-11-17 VITALS — BP 116/72 | Ht 64.25 in | Wt 163.0 lb

## 2013-11-17 DIAGNOSIS — Z01419 Encounter for gynecological examination (general) (routine) without abnormal findings: Secondary | ICD-10-CM

## 2013-11-17 DIAGNOSIS — M810 Age-related osteoporosis without current pathological fracture: Secondary | ICD-10-CM

## 2013-11-17 DIAGNOSIS — E039 Hypothyroidism, unspecified: Secondary | ICD-10-CM

## 2013-11-17 MED ORDER — LEVOTHYROXINE SODIUM 125 MCG PO TABS: 125.0000 ug | ORAL_TABLET | Freq: Every day | ORAL | Status: DC

## 2013-11-17 NOTE — Progress Notes (Signed)
Victoria Levine [birth year] [id number]    History:    Presents for breast and pelvic exam.  Postmenopausal/no HRT/no bleeding. Hypothyroid on 137 mcg daily. Normal Pap and mammogram history. History of a compression spinal fracture on Prolia since 2013 (4doses). Had severe reaction to Reclast with a high fever causing hospitalization. Has tolerated prolia well. 10/2011 DEXA T score -2.2 left femoral neck FRAX 10%/1.6%. Traumatic fall 2 weeks ago had a concussion but no fractures.  Past medical history, past surgical history, family history and social history were all reviewed and documented in the EPIC chart. 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 Daughter has 2 children and pregnant with third has recently moved to [facility] from [place] husband is Oceanographer. Adopted.  ROS:  A  12 point ROS was performed and pertinent positives and negatives are included.  Exam:  Filed Vitals:   11/17/13 1107  BP: 116/72    General appearance:  Normal Thyroid:  Symmetrical, normal in size, without palpable masses or nodularity. Respiratory  Auscultation:  Clear without wheezing or rhonchi Cardiovascular  Auscultation:  Regular rate, without rubs, murmurs or gallops  Edema/varicosities:  Not grossly evident Abdominal  Soft,nontender, without masses, guarding or rebound.  Liver/spleen:  No organomegaly noted  Hernia:  None appreciated  Skin  Inspection:  Grossly normal   Breasts: Examined lying and sitting.     Right: Without masses, retractions, discharge or axillary adenopathy.     Left: Without masses, retractions, discharge or axillary adenopathy. Gentitourinary   Inguinal/mons:  Normal without inguinal adenopathy  External genitalia:  Normal  BUS/Urethra/Skene's glands:  Normal  Vagina:  Atrophic  Cervix:  Normal  Uterus:  normal in size, shape and contour.  Midline and mobile  Adnexa/parametria:     Rt: Without masses or tenderness.   Lt: Without masses or  tenderness.  Anus and perineum: Normal  Digital rectal exam: Normal sphincter tone without palpated masses or tenderness  Assessment/Plan:  63 y.o.DWF G1P1  for breast and pelvic exam.  Postmenopausal/no HRT/no bleeding Osteoporosis with spinal compression fractures on probably Hypothyroid/GERD/depression- primary care manages labs and meds  Plan: Repeat bone density, will schedule continue Prolia for 5 years. Home safety and fall prevention and importance of regular exercise discussed. SBE's, continue annual mammogram, calcium rich diet, vitamin D 2000 daily, vitamin D 33 2013. Pap normal 2014, new screening guidelines reviewed. Has not had a screening colonoscopy reviewed importance.   Note: This dictation was prepared with Dragon/digital dictation.  Any transcriptional errors that result are unintentional. [name] WHNP, 12:12 PM 11/17/2013

## 2013-11-17 NOTE — Patient Instructions (Signed)
Health Recommendations for Postmenopausal Women Respected and ongoing research has looked at the most common causes of death, disability, and poor quality of life in postmenopausal women. The causes include heart disease, diseases of blood vessels, diabetes, depression, cancer, and bone loss (osteoporosis). Many things can be done to help lower the chances of developing these and other common problems. CARDIOVASCULAR DISEASE Heart Disease: A heart attack is a medical emergency. Know the signs and symptoms of a heart attack. Below are things women can do to reduce their risk for heart disease.   Do not smoke. If you smoke, quit.  Aim for a healthy weight. Being overweight causes many preventable deaths. Eat a healthy and balanced diet and drink an adequate amount of liquids.  Get moving. Make a commitment to be more physically active. Aim for 30 minutes of activity on most, if not all days of the week.  Eat for heart health. Choose a diet that is low in saturated fat and cholesterol and eliminate trans fat. Include whole grains, vegetables, and fruits. Read and understand the labels on food containers before buying.  Know your numbers. Ask your caregiver to check your blood pressure, cholesterol (total, HDL, LDL, triglycerides) and blood glucose. Work with your caregiver on improving your entire clinical picture.  High blood pressure. Limit or stop your table salt intake (try salt substitute and food seasonings). Avoid salty foods and drinks. Read labels on food containers before buying. Eating well and exercising can help control high blood pressure. STROKE  Stroke is a medical emergency. Stroke may be the result of a blood clot in a blood vessel in the brain or by a brain hemorrhage (bleeding). Know the signs and symptoms of a stroke. To lower the risk of developing a stroke:  Avoid fatty foods.  Quit smoking.  Control your diabetes, blood pressure, and irregular heart rate. THROMBOPHLEBITIS  (BLOOD CLOT) OF THE LEG  Becoming overweight and leading a stationary lifestyle may also contribute to developing blood clots. Controlling your diet and exercising will help lower the risk of developing blood clots. CANCER SCREENING  Breast Cancer: Take steps to reduce your risk of breast cancer.  You should practice "breast self-awareness." This means understanding the normal appearance and feel of your breasts and should include breast self-examination. Any changes detected, no matter how small, should be reported to your caregiver.  After age 40, you should have a clinical breast exam (CBE) every year.  Starting at age 40, you should consider having a mammogram (breast X-ray) every year.  If you have a family history of breast cancer, talk to your caregiver about genetic screening.  If you are at high risk for breast cancer, talk to your caregiver about having an MRI and a mammogram every year.  Intestinal or Stomach Cancer: Tests to consider are a rectal exam, fecal occult blood, sigmoidoscopy, and colonoscopy. Women who are high risk may need to be screened at an earlier age and more often.  Cervical Cancer:  Beginning at age 30, you should have a Pap test every 3 years as long as the past 3 Pap tests have been normal.  If you have had past treatment for cervical cancer or a condition that could lead to cancer, you need Pap tests and screening for cancer for at least 20 years after your treatment.  If you had a hysterectomy for a problem that was not cancer or a condition that could lead to cancer, then you no longer need Pap tests.    If you are between ages 65 and 70, and you have had normal Pap tests going back 10 years, you no longer need Pap tests.  If Pap tests have been discontinued, risk factors (such as a new sexual partner) need to be reassessed to determine if screening should be resumed.  Some medical problems can increase the chance of getting cervical cancer. In these  cases, your caregiver may recommend more frequent screening and Pap tests.  Uterine Cancer: If you have vaginal bleeding after reaching menopause, you should notify your caregiver.  Ovarian Cancer: Other than yearly pelvic exams, there are no reliable tests available to screen for ovarian cancer at this time except for yearly pelvic exams.  Lung Cancer: Yearly chest X-rays can detect lung cancer and should be done on high risk women, such as cigarette smokers and women with chronic lung disease (emphysema).  Skin Cancer: A complete body skin exam should be done at your yearly examination. Avoid overexposure to the sun and ultraviolet light lamps. Use a strong sun block cream when in the sun. All of these things are important for lowering the risk of skin cancer. MENOPAUSE Menopause Symptoms: Hormone therapy products are effective for treating symptoms associated with menopause:  Moderate to severe hot flashes.  Night sweats.  Mood swings.  Headaches.  Tiredness.  Loss of sex drive.  Insomnia.  Other symptoms. Hormone replacement carries certain risks, especially in older women. Women who use or are thinking about using estrogen or estrogen with progestin treatments should discuss that with their caregiver. Your caregiver will help you understand the benefits and risks. The ideal dose of hormone replacement therapy is not known. The Food and Drug Administration (FDA) has concluded that hormone therapy should be used only at the lowest doses and for the shortest amount of time to reach treatment goals.  OSTEOPOROSIS Protecting Against Bone Loss and Preventing Fracture If you use hormone therapy for prevention of bone loss (osteoporosis), the risks for bone loss must outweigh the risk of the therapy. Ask your caregiver about other medications known to be safe and effective for preventing bone loss and fractures. To guard against bone loss or fractures, the following is recommended:  If  you are younger than age 50, take 1000 mg of calcium and at least 600 mg of Vitamin D per day.  If you are older than age 50 but younger than age 70, take 1200 mg of calcium and at least 600 mg of Vitamin D per day.  If you are older than age 70, take 1200 mg of calcium and at least 800 mg of Vitamin D per day. Smoking and excessive alcohol intake increases the risk of osteoporosis. Eat foods rich in calcium and vitamin D and do weight bearing exercises several times a week as your caregiver suggests. DIABETES Diabetes Mellitus: If you have type I or type 2 diabetes, you should keep your blood sugar under control with diet, exercise, and recommended medication. Avoid starchy and fatty foods, and too many sweets. Being overweight can make diabetes control more difficult. COGNITION AND MEMORY Cognition and Memory: Menopausal hormone therapy is not recommended for the prevention of cognitive disorders such as Alzheimer's disease or memory loss.  DEPRESSION  Depression may occur at any age, but it is common in elderly women. This may be because of physical, medical, social (loneliness), or financial problems and needs. If you are experiencing depression because of medical problems and control of symptoms, talk to your caregiver about this. Physical   activity and exercise may help with mood and sleep. Community and volunteer involvement may improve your sense of value and worth. If you have depression and you feel that the problem is getting worse or becoming severe, talk to your caregiver about which treatment options are best for you. ACCIDENTS  Accidents are common and can be serious in elderly woman. Prepare your house to prevent accidents. Eliminate throw rugs, place hand bars in bath, shower, and toilet areas. Avoid wearing high heeled shoes or walking on wet, snowy, and icy areas. Limit or stop driving if you have vision or hearing problems, or if you feel you are unsteady with your movements and  reflexes. HEPATITIS C Hepatitis C is a type of viral infection affecting the liver. It is spread mainly through contact with blood from an infected person. It can be treated, but if left untreated, it can lead to severe liver damage over the years. Many people who are infected do not know that the virus is in their blood. If you are a "baby-boomer", it is recommended that you have one screening test for Hepatitis C. IMMUNIZATIONS  Several immunizations are important to consider having during your senior years, including:   Tetanus, diphtheria, and pertussis booster shot.  Influenza every year before the flu season begins.  Pneumonia vaccine.  Shingles vaccine.  Others, as indicated based on your specific needs. Talk to your caregiver about these. Document Released: 04/27/2005 Document Revised: 07/20/2013 Document Reviewed: 12/22/2007 ExitCare Patient Information 2015 [employer]. This information is not intended to replace advice given to you by your health care provider. Make sure you discuss any questions you have with your health care provider.  

## 2013-11-18 ENCOUNTER — Other Ambulatory Visit: Payer: Self-pay | Admitting: Gynecology

## 2013-11-18 DIAGNOSIS — M810 Age-related osteoporosis without current pathological fracture: Secondary | ICD-10-CM

## 2013-11-25 ENCOUNTER — Telehealth: Payer: Self-pay | Admitting: Gynecology

## 2013-11-25 NOTE — Telephone Encounter (Signed)
11/25/13-Pt called to say we needed to call her [facility] insurance to get dexa benefits. I called them and was told the patient would have a 20% coinsurance for the test. Pt was informed of this today and knows we will bill it to her once we receive the response from [employer].wl

## 2013-12-08 ENCOUNTER — Ambulatory Visit (INDEPENDENT_AMBULATORY_CARE_PROVIDER_SITE_OTHER): Payer: Medicare Other

## 2013-12-08 ENCOUNTER — Encounter: Payer: Self-pay | Admitting: Gynecology

## 2013-12-08 DIAGNOSIS — M810 Age-related osteoporosis without current pathological fracture: Secondary | ICD-10-CM

## 2013-12-15 ENCOUNTER — Encounter: Payer: Self-pay | Admitting: Women's Health

## 2013-12-18 ENCOUNTER — Telehealth: Payer: Self-pay | Admitting: *Deleted

## 2013-12-18 NOTE — Telephone Encounter (Signed)
I called patient it is time for Prolia. Benefits are same as earlier this year, primary covers 80% and secondary pays remainder. Apt for 12/22/13. KW CMA

## 2013-12-22 ENCOUNTER — Ambulatory Visit (INDEPENDENT_AMBULATORY_CARE_PROVIDER_SITE_OTHER): Payer: Medicare Other | Admitting: Gynecology

## 2013-12-22 DIAGNOSIS — M810 Age-related osteoporosis without current pathological fracture: Secondary | ICD-10-CM

## 2013-12-22 MED ORDER — DENOSUMAB 60 MG/ML ~~LOC~~ SOLN
60.0000 mg | Freq: Once | SUBCUTANEOUS | Status: AC
  Administered 2013-12-22: 60 mg via SUBCUTANEOUS

## 2014-01-04 ENCOUNTER — Other Ambulatory Visit: Payer: Self-pay | Admitting: Obstetrics and Gynecology

## 2014-01-04 ENCOUNTER — Other Ambulatory Visit: Payer: Self-pay

## 2014-01-04 MED ORDER — VALACYCLOVIR HCL 500 MG PO TABS: ORAL_TABLET | ORAL | Status: DC

## 2014-01-04 NOTE — Telephone Encounter (Signed)
Pharmacy requesting 90 day supply

## 2014-01-18 ENCOUNTER — Encounter: Payer: Self-pay | Admitting: Gynecology

## 2014-04-02 DIAGNOSIS — M4806 Spinal stenosis, lumbar region: Secondary | ICD-10-CM | POA: Diagnosis not present

## 2014-04-02 DIAGNOSIS — M5416 Radiculopathy, lumbar region: Secondary | ICD-10-CM | POA: Diagnosis not present

## 2014-04-16 DIAGNOSIS — J45901 Unspecified asthma with (acute) exacerbation: Secondary | ICD-10-CM | POA: Diagnosis not present

## 2014-04-16 DIAGNOSIS — E871 Hypo-osmolality and hyponatremia: Secondary | ICD-10-CM | POA: Diagnosis not present

## 2014-04-16 DIAGNOSIS — E039 Hypothyroidism, unspecified: Secondary | ICD-10-CM | POA: Diagnosis not present

## 2014-04-28 DIAGNOSIS — Z6827 Body mass index (BMI) 27.0-27.9, adult: Secondary | ICD-10-CM | POA: Diagnosis not present

## 2014-04-28 DIAGNOSIS — M62838 Other muscle spasm: Secondary | ICD-10-CM | POA: Diagnosis not present

## 2014-04-28 DIAGNOSIS — R030 Elevated blood-pressure reading, without diagnosis of hypertension: Secondary | ICD-10-CM | POA: Diagnosis not present

## 2014-04-28 DIAGNOSIS — M810 Age-related osteoporosis without current pathological fracture: Secondary | ICD-10-CM | POA: Diagnosis not present

## 2014-04-28 DIAGNOSIS — M545 Low back pain: Secondary | ICD-10-CM | POA: Diagnosis not present

## 2014-04-28 DIAGNOSIS — M5416 Radiculopathy, lumbar region: Secondary | ICD-10-CM | POA: Diagnosis not present

## 2014-04-29 DIAGNOSIS — M5416 Radiculopathy, lumbar region: Secondary | ICD-10-CM | POA: Diagnosis not present

## 2014-04-29 DIAGNOSIS — Z6827 Body mass index (BMI) 27.0-27.9, adult: Secondary | ICD-10-CM | POA: Diagnosis not present

## 2014-04-29 DIAGNOSIS — R030 Elevated blood-pressure reading, without diagnosis of hypertension: Secondary | ICD-10-CM | POA: Diagnosis not present

## 2014-05-10 DIAGNOSIS — M961 Postlaminectomy syndrome, not elsewhere classified: Secondary | ICD-10-CM | POA: Diagnosis not present

## 2014-05-10 DIAGNOSIS — E871 Hypo-osmolality and hyponatremia: Secondary | ICD-10-CM | POA: Diagnosis not present

## 2014-05-10 DIAGNOSIS — M4807 Spinal stenosis, lumbosacral region: Secondary | ICD-10-CM | POA: Diagnosis not present

## 2014-05-12 DIAGNOSIS — E871 Hypo-osmolality and hyponatremia: Secondary | ICD-10-CM | POA: Diagnosis not present

## 2014-05-17 ENCOUNTER — Other Ambulatory Visit: Payer: Self-pay

## 2014-05-17 NOTE — Telephone Encounter (Signed)
When I try to refill it it says she is allergic to prednisone. Please check with patient

## 2014-05-24 DIAGNOSIS — M545 Low back pain: Secondary | ICD-10-CM | POA: Diagnosis not present

## 2014-05-24 DIAGNOSIS — M5416 Radiculopathy, lumbar region: Secondary | ICD-10-CM | POA: Diagnosis not present

## 2014-05-25 ENCOUNTER — Ambulatory Visit: Payer: Commercial Managed Care - HMO | Attending: Neurosurgery

## 2014-05-25 DIAGNOSIS — E039 Hypothyroidism, unspecified: Secondary | ICD-10-CM | POA: Diagnosis not present

## 2014-05-25 DIAGNOSIS — M5442 Lumbago with sciatica, left side: Secondary | ICD-10-CM

## 2014-05-25 DIAGNOSIS — E780 Pure hypercholesterolemia: Secondary | ICD-10-CM | POA: Insufficient documentation

## 2014-05-25 DIAGNOSIS — Z981 Arthrodesis status: Secondary | ICD-10-CM | POA: Diagnosis not present

## 2014-05-25 DIAGNOSIS — R6889 Other general symptoms and signs: Secondary | ICD-10-CM | POA: Diagnosis not present

## 2014-05-25 DIAGNOSIS — K219 Gastro-esophageal reflux disease without esophagitis: Secondary | ICD-10-CM | POA: Insufficient documentation

## 2014-05-25 DIAGNOSIS — J45909 Unspecified asthma, uncomplicated: Secondary | ICD-10-CM | POA: Diagnosis not present

## 2014-05-25 DIAGNOSIS — M810 Age-related osteoporosis without current pathological fracture: Secondary | ICD-10-CM | POA: Diagnosis not present

## 2014-05-25 NOTE — Therapy (Signed)
[facility] [address] [place], [place], [address] Phone: [phone]   Fax:  [phone]  Physical Therapy Evaluation  Patient Details  Name: Victoria Levine MRN: [id number] Date of Birth: September 28, 1950 Referring Provider:  Chyrl Civatte Ardis Rowan*  Encounter Date: 05/25/2014      PT End of Session - 05/25/14 1156    Visit Number 1   Number of Visits 10   Date for PT Re-Evaluation --  Medicare   PT Start Time 1109   PT Stop Time 1147   PT Time Calculation (min) 38 min   Activity Tolerance Patient tolerated treatment well   Behavior During Therapy [facility] for tasks assessed/performed      Past Medical History  Diagnosis Date  . Asthma   . Bipolar disorder   . Depression   . DDD (degenerative disc disease)   . Hiatal hernia   . Osteoporosis 11/2013    T score -2.6 AP spine. 1 dose Reclast with reaction Prolia since 10/2011 most recent DEXA shows significant improvement at all measured sites.  . S/P adenoidectomy   . S/P tonsillectomy   . Hypothyroid   . Migraines   . Herpes     Past Surgical History  Procedure Laterality Date  . Cesarean section    . Dilation and curettage of uterus    . Hysteroscopy    . Compression fracture spine    . Lumbar fusion      There were no vitals taken for this visit.  Visit Diagnosis:  Left-sided low back pain with left-sided sciatica - Plan: PT plan of care cert/re-cert  Activity intolerance - Plan: PT plan of care cert/re-cert      Subjective Assessment - 05/25/14 1118    Symptoms Pt presents to PT with chronic LBP after lumbar fracture with fusion surgery in 2011.  Pt had flare-up 1 month ago with Lt hip pain due to herniated disc.  Pt would like to avoid surgery.   Pertinent History Lumbar fusion 2011. Steroid injection into lumbar spine 05/24/14   Limitations Standing;Walking   How long can you stand comfortably? 10 minutes max   How long can you walk  comfortably? 1/2 mile-20 minutes max   Diagnostic tests MRI this month and pt reports L5/S1 disc herniation to the Lt   Patient Stated Goals reduce pain, return to walking for exercise, avoid surgery   Currently in Pain? Yes   Pain Score 7    Pain Location Back   Pain Orientation Lower;Left   Pain Descriptors / Indicators Sore   Pain Type Chronic pain   Pain Radiating Towards Lt hip   Pain Onset More than a month ago   Pain Frequency Constant   Aggravating Factors  standing and walking, prolonged driving, houswork especially cleaning tub/shower and vacuuming/sweeping   Pain Relieving Factors Medication, TENs unit, heat   Multiple Pain Sites No          [facility] PT Assessment - 05/25/14 0001    Assessment   Medical Diagnosis chronic LBP (M54.5)   Onset Date 11/24/09   Precautions   Precautions Other (comment)  osteoporosis   Restrictions   Weight Bearing Restrictions No   Balance Screen   Has the patient fallen in the past 6 months Yes   How many times? 4  outside with taking trash out especially when wet outside   Has the patient had a decrease in activity level because of a fear of falling?  No  Is the patient reluctant to leave their home because of a fear of falling?  No   Home Environment   Living Enviornment Private residence   Living Arrangements Alone   Prior Function   Level of Independence Independent with basic ADLs   Vocation Retired   Leisure Pt likes to walk for exercise but has not been able to do this   Cognition   Overall Cognitive Status Within Functional Limits for tasks assessed   Observation/Other Assessments   Focus on Therapeutic Outcomes (FOTO)  69% limitation   Posture/Postural Control   Posture/Postural Control Postural limitations   Postural Limitations Rounded Shoulders;Forward head;Flexed trunk   ROM / Strength   AROM / PROM / Strength AROM;PROM;Strength   AROM   Overall AROM  Within functional limits for tasks performed   Overall AROM  Comments Hip and lumbar AROM are wWFLS.  Pt reports Lt lumbar/hip pain with Lt lumbar sidebending   AROM Assessment Site Lumbar;Hip   Strength   Overall Strength Within functional limits for tasks performed   Overall Strength Comments 5/5 LE strength throughout   Strength Assessment Site Hip;Knee   Palpation   Palpation Pt with palpable tendenress over bilateral lumbar paraspinals and Lt SI joint/deep gluteals.    Special Tests    Special Tests Lumbar   Slump test   Findings Negative   Side Left   Ambulation/Gait   Ambulation/Gait Yes   Ambulation/Gait Assistance 7: Independent   Gait Pattern Within Functional Limits                  [facility] Adult PT Treatment/Exercise - 05/25/14 0001    Exercises   Exercises Lumbar;Knee/Hip   Lumbar Exercises: Standing   Other Standing Lumbar Exercises standing lumbar extension   Lumbar Exercises: Prone   Other Prone Lumbar Exercises McKenzie extension: prone on elbows and prone press.                PT Education - 05/25/14 1155    Education provided Yes   Education Details McKenzie extension and extension based standing exercise   Person(s) Educated Patient   Methods Explanation;Demonstration;Handout;Tactile cues   Comprehension Verbalized understanding;Returned demonstration          PT Short Term Goals - 05/25/14 1158    PT SHORT TERM GOAL #1   Title be independent in initial HEP   Time 4   Period Weeks   Status New   PT SHORT TERM GOAL #2   Title report a 30% reduction in Lt hip pain with standing and walking   Time 4   Period Weeks   Status New   PT SHORT TERM GOAL #3   Title stand for 15 minutes for home tasks without need to sit   Time 4   Period Weeks   Status New   PT SHORT TERM GOAL #4   Title return to walking for exercise and verbalize understanding of safe progression   Time 4   Period Weeks   Status New           PT Long Term Goals - 05/25/14 1109    PT LONG TERM GOAL #1   Title be  independent in advanced HEP   Time 8   Period Weeks   Status New   PT LONG TERM GOAL #2   Title reduce FOTO to < or = to 69% limitaiton   Time 8   Period Weeks   Status New   PT LONG TERM  GOAL #3   Title walk for 20-30 minutes to allow for advancement of walking program for exercise   Time 8   Period Days   Status New   PT LONG TERM GOAL #4   Title report a 50% reduction in Lt hip pain with standing and walking   Time 8   Period Weeks   Status New               Plan - May 26, 2014 1156    Clinical Impression Statement Pt demonstrates postural abnormality and Lt lumbar pain due to heniated disc.  Pt would benefit from PT to reduce pain and allow for return to regular weightbearing exercise and walking for exercise   Pt will benefit from skilled therapeutic intervention in order to improve on the following deficits Difficulty walking;Improper body mechanics;Decreased endurance;Postural dysfunction;Decreased activity tolerance;Decreased knowledge of precautions;Pain   Rehab Potential Good   PT Frequency 2x / week   PT Duration 8 weeks   PT Treatment/Interventions Moist Heat;ADLs/Self Care Home Management;Therapeutic activities;Patient/family education;Ultrasound;Neuromuscular re-education;Electrical Stimulation;Cryotherapy   PT Next Visit Plan Osteoporosis handout for posture, decompression exercise, core strength   PT Home Exercise Plan see above- add to HEP   Consulted and Agree with Plan of Care Patient          G-Codes - May 26, 2014 1110    Functional Assessment Tool Used FOTO: 69% limitation   Functional Limitation Other PT primary   Other PT Primary Current Status (N3614) At least 60 percent but less than 80 percent impaired, limited or restricted   Other PT Primary Goal Status (E3154) At least 40 percent but less than 60 percent impaired, limited or restricted       Problem List Patient Active Problem List   Diagnosis Date Noted  . Osteoporosis, unspecified  11/17/2013  . Migraines   . Herpes   . HYPOTHYROIDISM 08/13/2008  . HYPERCHOLESTEROLEMIA 08/13/2008  . BIPOLAR DISORDER UNSPECIFIED 08/13/2008  . CHRONIC OBSTRUCTIVE ASTHMA UNSPECIFIED 08/13/2008  . GASTROESOPHAGEAL REFLUX DISEASE 08/13/2008  . HIATAL HERNIA 08/13/2008  . ALLERGY 08/13/2008    Taytum Wheller, PT 05/26/14, 12:06 PM  [facility] [address], [place], [place], [address] Phone: [phone]   Fax:  [phone]

## 2014-05-25 NOTE — Patient Instructions (Signed)
On Elbows (Prone)   Rise up on elbows as high as possible, keeping hips on floor. Hold _3___ seconds. Repeat _5-10___ times per set. Do __1__ sets per session. Do __3-5External Rotation (Eccentric) - Prone on Elbows (Resistance Band)   Lie on elbows.  Hold this position for 3-5 minutes  Copyright  VHI. All rights reserved.  __ sessions per day.  http://orth.exer.us/93    Copyright  VHI. All rights reserved.  Stand and put hands on the hips.  Extend your trunk, repeat 5-10 times.   NO FORWARD BENDING!

## 2014-06-01 ENCOUNTER — Ambulatory Visit: Payer: Commercial Managed Care - HMO | Admitting: Physical Therapy

## 2014-06-02 ENCOUNTER — Ambulatory Visit: Payer: Commercial Managed Care - HMO | Admitting: Physical Therapy

## 2014-06-02 ENCOUNTER — Encounter: Payer: Self-pay | Admitting: Physical Therapy

## 2014-06-02 DIAGNOSIS — M810 Age-related osteoporosis without current pathological fracture: Secondary | ICD-10-CM | POA: Diagnosis not present

## 2014-06-02 DIAGNOSIS — E039 Hypothyroidism, unspecified: Secondary | ICD-10-CM | POA: Diagnosis not present

## 2014-06-02 DIAGNOSIS — R6889 Other general symptoms and signs: Secondary | ICD-10-CM | POA: Diagnosis not present

## 2014-06-02 DIAGNOSIS — M5442 Lumbago with sciatica, left side: Secondary | ICD-10-CM | POA: Diagnosis not present

## 2014-06-02 DIAGNOSIS — K219 Gastro-esophageal reflux disease without esophagitis: Secondary | ICD-10-CM | POA: Diagnosis not present

## 2014-06-02 DIAGNOSIS — J45909 Unspecified asthma, uncomplicated: Secondary | ICD-10-CM | POA: Diagnosis not present

## 2014-06-02 DIAGNOSIS — Z981 Arthrodesis status: Secondary | ICD-10-CM | POA: Diagnosis not present

## 2014-06-02 DIAGNOSIS — E780 Pure hypercholesterolemia: Secondary | ICD-10-CM | POA: Diagnosis not present

## 2014-06-02 NOTE — Therapy (Addendum)
[facility] [address], [place], [place], [address] Phone: [phone]   Fax:  [phone]  Physical Therapy Treatment  Patient Details  Name: Victoria Levine MRN: [id number] Date of Birth: [birth year] Referring Provider:  Chyrl Civatte Ardis Rowan*  Encounter Date: 06/02/2014      PT End of Session - 06/02/14 1329    Visit Number 2   Number of Visits 10   Date for PT Re-Evaluation 07/20/14   PT Start Time 1232   PT Stop Time 1315   PT Time Calculation (min) 43 min   Behavior During Therapy [facility] for tasks assessed/performed      Past Medical History  Diagnosis Date  . Asthma   . Bipolar disorder   . Depression   . DDD (degenerative disc disease)   . Hiatal hernia   . Osteoporosis 11/2013    T score -2.6 AP spine. 1 dose Reclast with reaction Prolia since 10/2011 most recent DEXA shows significant improvement at all measured sites.  . S/P adenoidectomy   . S/P tonsillectomy   . Hypothyroid   . Migraines   . Herpes     Past Surgical History  Procedure Laterality Date  . Cesarean section    . Dilation and curettage of uterus    . Hysteroscopy    . Compression fracture spine    . Lumbar fusion      There were no vitals filed for this visit.  Visit Diagnosis:  Left-sided low back pain with left-sided sciatica  Activity intolerance      Subjective Assessment - 06/02/14 1240    Symptoms Pt reports today problems with breathing due to her asthma. Chronic LBP after lumbar fracture with fusions surgery.    Pertinent History Lumbar fusion 2011. Steroid injection into lumbar spine 05/24/14   Limitations Standing;Walking   How long can you stand comfortably? 10 minutes max   How long can you walk comfortably? 1/2 mile-20 minutes max   Diagnostic tests MRI this month and pt reports L5/S1 disc herniation to the Lt   Patient Stated Goals reduce pain, return to walking for exercise, avoid surgery   Currently  in Pain? Yes   Pain Score 7    Pain Location Back   Pain Orientation Left;Lower   Pain Descriptors / Indicators Penetrating;Sore   Pain Type Chronic pain   Pain Radiating Towards Lt hip   Pain Onset More than a month ago   Pain Frequency Constant   Aggravating Factors  standing and walking, prolonged driving, housework esspecially vacuuming/sweeping   Pain Relieving Factors Medication, TENS, resting, heat   Effect of Pain on Daily Activities Very limited with daily activities   Multiple Pain Sites No                       [facility] Adult PT Treatment/Exercise - 06/02/14 0001    Lumbar Exercises: Stretches   Active Hamstring Stretch 3 reps;20 seconds  each leg   Lumbar Exercises: Aerobic   Stationary Bike L1 x61mn  pt tolerated well   Lumbar Exercises: Standing   Other Standing Lumbar Exercises hip extension  3 x10 at sink   Knee/Hip Exercises: Supine   Short Arc Quad Sets Strengthening;3 sets  3# added   Knee/Hip Exercises: Prone   Other Prone Exercises Prone on elbows x 3 min                PT Education - 06/02/14 1326  Education provided Yes   Education Details Osteoporosis info, and Do's and Dont's   Person(s) Educated Patient   Methods Explanation;Handout   Comprehension Verbalized understanding          PT Short Term Goals - 06/02/14 1339    PT SHORT TERM GOAL #1   Title be independent in initial HEP   Period Weeks   Status On-going   PT SHORT TERM GOAL #2   Title report a 30% reduction in Lt hip pain with standing and walking   Time 4   Period Weeks   Status On-going   PT SHORT TERM GOAL #3   Title stand for 15 minutes for home tasks without need to sit   Time 4   Period Weeks   Status On-going   PT SHORT TERM GOAL #4   Title return to walking for exercise and verbalize understanding of safe progression   Time 4   Period Weeks   Status On-going           PT Long Term Goals - 06/02/14 1340    PT LONG TERM GOAL #1    Title be independent in advanced HEP   Time 8   Period Weeks   Status On-going   PT LONG TERM GOAL #2   Title reduce FOTO to < or = to 69% limitaiton   Time 8   Period Weeks   Status On-going   PT LONG TERM GOAL #3   Title walk for 20-30 minutes to allow for advancement of walking program for exercise   Time 8   Period Weeks   Status On-going   PT LONG TERM GOAL #4   Title report a 50% reduction in Lt hip pain with standing and walking   Time 8   Period Weeks   Status On-going               Plan - 06/02/14 1337    Clinical Impression Statement Pt ambulates with incr trunkflexion slow pace with movements   Pt will benefit from skilled therapeutic intervention in order to improve on the following deficits Difficulty walking;Improper body mechanics;Decreased endurance;Postural dysfunction;Decreased activity tolerance;Decreased knowledge of precautions;Pain   Rehab Potential Good   PT Frequency 2x / week   PT Duration 8 weeks   PT Treatment/Interventions Moist Heat;ADLs/Self Care Home Management;Therapeutic activities;Patient/family education;Ultrasound;Neuromuscular re-education;Electrical Stimulation;Cryotherapy   PT Next Visit Plan Decompression exercisses, core strength   PT Home Exercise Plan see above   Consulted and Agree with Plan of Care Patient        Problem List Patient Active Problem List   Diagnosis Date Noted  . Osteoporosis, unspecified 11/17/2013  . Migraines   . Herpes   . HYPOTHYROIDISM 08/13/2008  . HYPERCHOLESTEROLEMIA 08/13/2008  . BIPOLAR DISORDER UNSPECIFIED 08/13/2008  . CHRONIC OBSTRUCTIVE ASTHMA UNSPECIFIED 08/13/2008  . GASTROESOPHAGEAL REFLUX DISEASE 08/13/2008  . HIATAL HERNIA 08/13/2008  . ALLERGY 08/13/2008    NAUMANN-HOUEGNIFIO,Sherif Millspaugh PTA 06/02/2014, 1:43 PM  PHYSICAL THERAPY DISCHARGE SUMMARY  Visits from Start of Care: 2  Current functional level related to goals / functional outcomes: Pt attended 2 PT sessions and then  didn't return to PT.  See above for status at final PT session.     Remaining deficits: Unknown as pt didn't return to PT.  Thank you for this referral.    Education / Equipment: HEP, fall prevention education Plan: Patient agrees to discharge.  Patient goals were not met. Patient is being discharged due to not returning since  the last visit.  ?????   Sigurd Sos, PT 08/17/2014 8:07 AM  [facility] [address], [place], [place], [address] Phone: [phone]   Fax:  [phone]

## 2014-06-02 NOTE — Patient Instructions (Addendum)
Osteoporosis   What is Osteoporosis?  - A silent disease in which the skeleton is weakened by decreased bone density. - Characterized by low bone mass, deterioration of bone, and increased risk of fracture postmenopausal (primary) or the result of an identifiable condition/event (secondary) - Commonly found in the wrists, spine, and hips; these are high-risk stress areas and very susceptible to fractures.  The Facts: - There are 1.5 million fractures/year o 500,000 spine; 250,000 hip with over 60,000 nursing home admissions secondary to hip fracture; and 200,000 wrist - After hip fracture, only 50% of people able to walk independently prior to the fracture return to independent ambulation. - Bone mass: Peaks at age 70-30, and begins declining at age 71-50.   Osteoporosis is defined by the [facility]) as:  NOF/WHO Criteria for Interpreting Results of Bone Density Assessment  Results Diagnosis  Within 1 standard deviation (SD) of young adult mean Normal  Between 1 and -2.5 SD below mean, repeat in 2 years Low bone mass (osteopenia)  Greater than -2.5 SD below mean Osteoporosis  Greater than -2.5 SD below mean and one or more fragility fractures exist Severe Osteoporosis  *Results can be affected by positioning of the body in the DEXA scan, presence of current or old fractures, arthritis, extraneous calcifications.    Osteoporosis is not just a women's disease!  - 30-40% of women will develop osteoporosis - 5-15% of males will develop osteoporosis   What are the risk factors?  1. Female 2. Thin, small frame 3. Caucasian, Asian race 4. Early menopause (<66 years old)/amenorrhea/delayed puberty 49. Old age 11. Family history (fractures, stooped posture)\ 7. Low calcium diet 8. Sedentary lifestyle 9. Alcohol, Caffeine, Smoking 10. Malnutrition, GI Disea  11. Prolonged use of Glucocorticoids (Prednisone), Meds to treat asthma, arthritis, cancers,  thyroid, and anti-seizure meds.  How do I know for sure?  Get a BONE DENSITY TEST!  This measures bone loss and it's painless, non-invasive, and only takes 5-10 minutes!  What can I do about it?  ? Decrease your risk factors (alcohol, caffeine, smoking) ? Helpful medications (see next page) ? Adequate Calcium and Vitamin D intake ? Get active! o Proper posture - Sit and stand tall! No slouching or twisting o Weight-Bearing Exercise - walking, stair climbing, elliptical; NO jogging or high-impact exercise. o Resistive Exercise - Cybex weight equipment, Nautilus, dumbbells, therabands  **Be sure to maintain proper alignment when lifting any weight!!  **When using equipment, avoid abdominal exercises which involve "crunching" or curling or twisting the trunk, biceps machines, cross-country machines, moving handlebars, or ANY MACHINE WITH ROTATION OR FORWARD BENDING!!!           Approved Pharmacologic Management of Osteoporosis  Agent Approved for prevention Approved for treatment BMD increased spine/hip Fracture reduction  Estrogen/Hormone Therapy (Estrace, Estratab, Ogen, Premarin, Vivell, Prempro, Femhert, Orthoest) Yes Yes 3-6% 35% spine and hip  Bisphosphonates  (Fosamax, Actonel, Boniva) Yes Yes 3-8% 35-50% spine and non-spine  Calcitonin (Miacalcin, Calcimar, Fortical) No Yes 0-3% None stated  Raloxifene (Evista) Yes Yes 2-3% 30-55%  Parathyroid Hormone (Forteo) No Yes, only in those at high risk for fracture None stated 53-65%     Recommended Daily Calcium Intakes   Population Group NIH/NOF* (mg elemental calcium)  Children 1-10 years [phone]  Children 11-24 years 45-1500  Men and Women 25-64 years At least 1200  Pregnant/Lactating At least 1200  Postmenopausal women with hormone replacement therapy At least 1200  Postmenopausal women without  hormone replacement therapy At least 1200  Men and women 65 + At least 1200  *In 1987, 1990, 1994, and 2000,  the NIH held consensus conferences on osteoporosis and calcium.  This column shows the most recent recommendations regarding calcium intak for preventing and managing osteoporosis.          Calcium Content of Selected Foods  Dairy Foods Calcium Content (mg) Non-Dairy Foods Calcium Content (mg)  Buttermilk, 1 cup 300 Calcium-fortified juice, 1 cup 300  Milk, 1 cup 300 Salmon, canned with Bones, 2 oz 100  Lactaid milk, 1 cup 300-500 Oysters, raw 13-19 medium 226  Soy milk, 1 cup 200-300 Sardines, canned with bones, 3 oz 372  Yogurt (plain, lowfat) 1 cup 250-300 Shrimp, canned 3 oz 98  Frozen yogurt (fruit) 1 cup 200-600 Collard greens, cooked 1 cup 357  Cheddar, mozzarella, or Muenster cheese, 1oz 205 Broccoli, cooked 1 cup 78  Cottage cheese (lowfat) 4 oz 200 Soybeans, cooked 1 cup 131  Part-skim ricotta cheese, 4oz 335 Tofu, 4oz* *  Vanilla ice cream, 1 cup 120-300    *Calcium content of tofu varies depending on processing method; check nutritional label on package for precise calcium content.     Suggested Guidelines for Calcium Supplement Use:  ? Calcium is absorbed most efficiently if taken in small amounts throughout the day.  Always divide the daily dose into smaller amounts if the total daily dose is 500mg or more per day.  The body cannot use more than 500mg Calcium at any one time. ? The use of manufactured supplements is encouraged.  Calcium as bone meal or dolomite may contain lead or other heavy metals as contaminants. ? Calcium supplements should not be taken with high fiber meals or with bulk forming laxatives. ? If calcium carbonate is used as the supplement form, it should be taken with meals to assure that stomach acid production is present to facilitate optimal dissolution and absorption of calcium.  This is important if atrophic gastritis with hypo- or achlorhydria is present, which it is in 20-50% of older individuals. ? It is important to drink plenty  of fluids while using the supplement to help reduce problems with side effects like constipation or bloating.  If these symptoms become a problem, switching to another form of supplement may be the answer. ? Another alternative is calcium-fortified foods, including fruit juices, cereals, and breads.  These foods are now marketed with added calcium and may be less likely to cause side effects. ? Those with personal or family histories of kidney stones should be monitored to assure that hypercalcuria does not occur. CALCIUM INTAKE QUIZ  Dairy products are the primary source of calcium for most people.  For a quick estimate of your daily calcium intake, complete the following steps:  1. Use the chart below to determine your daily intake of calcium from diary foods. Servings of dairy per day 1 2 3 4 5 6 7 8  Milligrams (mg) of calcium: 250 500 750 1000 1250 1500 1750 2000   2.  Enter your total daily calcium intake from dairy foods:     _____mg  3.  Add 350 mg, which is the average for all other dietary sources:                 +            350 mg  4.  The sum of your total daily calcium intake:                 ______mg  5.  Enter the recommended calcium intake for your age from the chart below;         ______mg  6.  Enter your daily intake from step 4 above and subtract:                             -        _______mg  7.  The result is how much additional calcium you need:                                          ______mg      Recommended Daily Calcium Intake  Population Calcium (mg)  Children 1-10 years 800-1200  Children 11-24 years 1200-1500  Men and women 25-64 At least 1200  Pregnant/Lactating At least 1200  Postmenopausal women with hormone replacement therapy At least 1200  Postmenopausal women without hormone replacement therapy At least 1200  Men and women 65+ At least 1200       SAFETY TIPS FOR FALL PREVENTION   1. Remove throw rugs and make certain carpet edges are  securely fastened to the floor.  2. Reduce clutter, especially in traffic areas of the home.   3. Install/maintain sturdy handrails at stairs.  4. Increase wattage of lighting in hallways, bathrooms, kitchens, stairwells, and entrances to home.  5. Use night-lights near bed, in hallways, and in bathroom to improve night safety.  6. Install safety handrails in shower, tub, and around toilet.  Bathtubs and shower stalls should have non-skid surfaces.  7. When you must reach for something high, use a safety step stool, one with wide steps and a friction surface to stand on.  A type equipped with a high handrail is preferred.  8. If a cane or other walking aid has been recommended, use it to help increase your stability.  9. Wear supportive, cushioned, low-heeled shoes.  Avoid "scuffs" (backless bedroom slippers) and high heels.  10. Avoid rushing to answer a phone, doorbell, or anything else!  A portable phone that you can take from room to room with you is a good idea for security and safety.  11. Exercise regularly and stay active!!    Resources  National Osteoporosis Foundation www.NOF.org   Exercise for Osteoporosis; A Safe and Effective Way to Build Bone Density and Muscle Strength By: Dianne Daniels, M.A.  

## 2014-06-09 ENCOUNTER — Telehealth: Payer: Self-pay | Admitting: Gynecology

## 2014-06-09 ENCOUNTER — Ambulatory Visit: Payer: Commercial Managed Care - HMO | Admitting: Physical Therapy

## 2014-06-09 NOTE — Telephone Encounter (Signed)
Phone call to pt received benefits from [employer], incorrect insurance information per pt. Called [employer] and resubmitted new insurance information. Informed pt that I would call when new benefits arrived. Patient is due after 06/24/2014. Last calcium level in Epic was 2014, she has had recent labs with PCP she will fax information for Calcium.

## 2014-06-14 ENCOUNTER — Encounter: Payer: Medicare Other | Admitting: Physical Therapy

## 2014-06-16 ENCOUNTER — Encounter: Payer: Medicare Other | Admitting: Physical Therapy

## 2014-06-21 ENCOUNTER — Encounter: Payer: Medicare Other | Admitting: Physical Therapy

## 2014-06-23 ENCOUNTER — Encounter: Payer: Medicare Other | Admitting: Physical Therapy

## 2014-06-24 DIAGNOSIS — M4316 Spondylolisthesis, lumbar region: Secondary | ICD-10-CM | POA: Diagnosis not present

## 2014-06-24 DIAGNOSIS — Z6827 Body mass index (BMI) 27.0-27.9, adult: Secondary | ICD-10-CM | POA: Diagnosis not present

## 2014-06-24 DIAGNOSIS — R030 Elevated blood-pressure reading, without diagnosis of hypertension: Secondary | ICD-10-CM | POA: Diagnosis not present

## 2014-06-24 NOTE — Telephone Encounter (Signed)
Unable to verify benefits for patient thru new insurance  [place]. Benefits thru [employer] are No Deductible , 20% co insurance, OOP MAX $5500 [id number] met). No PA needed, No referral, If billed with OV $45 co pay/ We are unable to verify due to [facility] states that pt has additional insurance thru [place]. I left detailed message for patient to call me and also advised her to call [employer] to confirm insurance coverage with [facility] and [place].  Calcium level 9.7  05/12/14, [employer] due after 06/24/14

## 2014-06-28 ENCOUNTER — Encounter: Payer: Medicare Other | Admitting: Physical Therapy

## 2014-06-29 ENCOUNTER — Other Ambulatory Visit: Payer: Self-pay | Admitting: Neurosurgery

## 2014-06-29 NOTE — Telephone Encounter (Signed)
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 She has proof. [employer] is aware.  Pt is about to have back surgery with rods/fusion. Will let us know when surgeon wants to proceed with Prolia. KW CMA

## 2014-07-20 ENCOUNTER — Encounter (HOSPITAL_COMMUNITY)
Admission: RE | Admit: 2014-07-20 | Discharge: 2014-07-20 | Disposition: A | Discharge status: Home / Self Care | Payer: Commercial Managed Care - HMO | Source: Ambulatory Visit | Attending: Neurosurgery | Admitting: Neurosurgery

## 2014-07-20 ENCOUNTER — Encounter (HOSPITAL_COMMUNITY): Payer: Self-pay

## 2014-07-20 DIAGNOSIS — Z79899 Other long term (current) drug therapy: Secondary | ICD-10-CM | POA: Diagnosis not present

## 2014-07-20 DIAGNOSIS — Z981 Arthrodesis status: Secondary | ICD-10-CM | POA: Diagnosis not present

## 2014-07-20 DIAGNOSIS — Z9889 Other specified postprocedural states: Secondary | ICD-10-CM | POA: Diagnosis not present

## 2014-07-20 DIAGNOSIS — B009 Herpesviral infection, unspecified: Secondary | ICD-10-CM | POA: Diagnosis present

## 2014-07-20 DIAGNOSIS — M6281 Muscle weakness (generalized): Secondary | ICD-10-CM | POA: Diagnosis not present

## 2014-07-20 DIAGNOSIS — R2689 Other abnormalities of gait and mobility: Secondary | ICD-10-CM | POA: Diagnosis not present

## 2014-07-20 DIAGNOSIS — E039 Hypothyroidism, unspecified: Secondary | ICD-10-CM | POA: Diagnosis present

## 2014-07-20 DIAGNOSIS — Z882 Allergy status to sulfonamides status: Secondary | ICD-10-CM | POA: Diagnosis not present

## 2014-07-20 DIAGNOSIS — F319 Bipolar disorder, unspecified: Secondary | ICD-10-CM | POA: Diagnosis present

## 2014-07-20 DIAGNOSIS — M810 Age-related osteoporosis without current pathological fracture: Secondary | ICD-10-CM | POA: Diagnosis present

## 2014-07-20 DIAGNOSIS — T81599A Other complications of foreign body accidentally left in body following unspecified procedure, initial encounter: Secondary | ICD-10-CM | POA: Diagnosis present

## 2014-07-20 DIAGNOSIS — J45909 Unspecified asthma, uncomplicated: Secondary | ICD-10-CM | POA: Diagnosis present

## 2014-07-20 DIAGNOSIS — Z4789 Encounter for other orthopedic aftercare: Secondary | ICD-10-CM | POA: Diagnosis not present

## 2014-07-20 DIAGNOSIS — M4316 Spondylolisthesis, lumbar region: Secondary | ICD-10-CM | POA: Diagnosis present

## 2014-07-20 DIAGNOSIS — K219 Gastro-esophageal reflux disease without esophagitis: Secondary | ICD-10-CM | POA: Diagnosis present

## 2014-07-20 DIAGNOSIS — M4326 Fusion of spine, lumbar region: Secondary | ICD-10-CM | POA: Diagnosis not present

## 2014-07-20 DIAGNOSIS — J449 Chronic obstructive pulmonary disease, unspecified: Secondary | ICD-10-CM | POA: Diagnosis present

## 2014-07-20 DIAGNOSIS — Z462 Encounter for fitting and adjustment of other devices related to nervous system and special senses: Secondary | ICD-10-CM | POA: Diagnosis not present

## 2014-07-20 DIAGNOSIS — M5416 Radiculopathy, lumbar region: Secondary | ICD-10-CM | POA: Diagnosis present

## 2014-07-20 DIAGNOSIS — M4806 Spinal stenosis, lumbar region: Secondary | ICD-10-CM | POA: Diagnosis present

## 2014-07-20 DIAGNOSIS — Z888 Allergy status to other drugs, medicaments and biological substances status: Secondary | ICD-10-CM | POA: Diagnosis not present

## 2014-07-20 DIAGNOSIS — Z7951 Long term (current) use of inhaled steroids: Secondary | ICD-10-CM | POA: Diagnosis not present

## 2014-07-20 DIAGNOSIS — R278 Other lack of coordination: Secondary | ICD-10-CM | POA: Diagnosis not present

## 2014-07-20 DIAGNOSIS — M545 Low back pain: Secondary | ICD-10-CM | POA: Diagnosis present

## 2014-07-20 LAB — BASIC METABOLIC PANEL
Anion gap: 9 (ref 5–15)
BUN: 8 mg/dL (ref 6–20)
CO2: 26 mmol/L (ref 22–32)
Calcium: 9.5 mg/dL (ref 8.9–10.3)
Chloride: 102 mmol/L (ref 101–111)
Creatinine, Ser: 0.73 mg/dL (ref 0.44–1.00)
GFR calc Af Amer: >60 mL/min (ref >60)
GFR calc non Af Amer: >60 mL/min (ref >60)
Glucose, Bld: 90 mg/dL (ref 70–99)
Potassium: 4 mmol/L (ref 3.5–5.1)
Sodium: 137 mmol/L (ref 135–145)

## 2014-07-20 LAB — CBC
HCT: 37.4 % (ref 36.0–46.0)
Hemoglobin: 12.4 g/dL (ref 12.0–15.0)
MCH: 29 pg (ref 26.0–34.0)
MCHC: 33.2 g/dL (ref 30.0–36.0)
MCV: 87.6 fL (ref 78.0–100.0)
Platelets: 300 10*3/uL (ref 150–400)
RBC: 4.27 MIL/uL (ref 3.87–5.11)
RDW: 12.9 % (ref 11.5–15.5)
WBC: 8.1 10*3/uL (ref 4.0–10.5)

## 2014-07-20 LAB — TYPE AND SCREEN
ABO/RH(D): AB POS
Antibody Screen: NEGATIVE

## 2014-07-20 LAB — SURGICAL PCR SCREEN
MRSA, PCR: NEGATIVE
Staphylococcus aureus: POSITIVE — AB

## 2014-07-20 NOTE — Pre-Procedure Instructions (Signed)
Victoria Levine  07/20/2014   Your procedure is scheduled on:  Jul 28, 2014  Report to [facility] Admitting at 6:30 AM.  Call this number if you have problems the morning of surgery: [phone]   Remember:   Do not eat food or drink liquids after midnight Tuesday MAY 10.   Take these medicines the morning of surgery with A SIP OF WATER: tiotropium (SPIRIVA), pantoprazole (PROTONIX), OXcarbazepine (TRILEPTAL PO),  levothyroxine  (SYNTHROID) , FLUoxetine (PROZAC), buPROPion (WELLBUTRIN XL)   If needed: acyclovir (ZOVIRAX ) tablet or ointment, beclomethasone (QVAR) inhaler-bring with you, oxyCODONE (OXYCONTIN), valACYclovir (VALTREX)   STOP ASPIRIN, NSAIDS(ALEVE, IBUPROFEN, ADVIL), FISH OIL, VITAMIN E, HERBAL SUPPLEMENTS ONE WEEK PRIOR TO SURGERY   Do not wear jewelry, make-up or nail polish.  Do not wear lotions, powders, or perfumes. You may wear deodorant.  Do not shave 48 hours prior to surgery.   Do not bring valuables to the hospital.  [facility] is not responsible   for any belongings or valuables.               Contacts, dentures or bridgework may not be worn into surgery.  Leave suitcase in the car. After surgery it may be brought to your room.  For patients admitted to the hospital, discharge time is determined by your  treatment team.               Patients discharged the day of surgery will not be allowed to drive home.  Name and phone number of your driver: FAMILY/FRIEND  Special Instructions: "PREPARING FOR SURGERY"   Please read over the following fact sheets that you were given: Pain Booklet, Coughing and Deep Breathing, Blood Transfusion Information and Surgical Site Infection Prevention

## 2014-07-27 MED ORDER — CEFAZOLIN SODIUM-DEXTROSE 2-3 GM-% IV SOLR
2.0000 g | INTRAVENOUS | Status: AC
  Administered 2014-07-28 (×2): 2 g via INTRAVENOUS
  Filled 2014-07-27: qty 50

## 2014-07-27 NOTE — H&P (Signed)
Victoria Levine is an 64 y.o. female.   Chief Complaint: patient who in the past underwent fusion at l4-5 but now she is having lumbar pain with radiation to both legs no better with conservative treatment including epidural injections. radilogical studies showed solid fusion at l4-5 but at l3-4 she has spondylolisthesis with stenosis. She wants to go ahead with surgery  Past Medical History  Diagnosis Date  . Asthma   . Bipolar disorder   . Depression   . DDD (degenerative disc disease)   . Hiatal hernia   . Osteoporosis 11/2013    T score -2.6 AP spine. 1 dose Reclast with reaction Prolia since 10/2011 most recent DEXA shows significant improvement at all measured sites.  . S/P adenoidectomy   . S/P tonsillectomy   . Hypothyroid   . Migraines   . Herpes     Past Surgical History  Procedure Laterality Date  . Cesarean section    . Dilation and curettage of uterus    . Hysteroscopy    . Compression fracture spine    . Lumbar fusion    . Eye surgery Bilateral     CATARACTS REMOVED, NO LENS PLACED    Family History  Problem Relation Age of Onset  . Adopted: Yes   Social History:  reports that she has never smoked. She does not have any smokeless tobacco history on file. She reports that she does not drink alcohol. Her drug history is not on file.  Allergies:  Allergies  Allergen Reactions  . Lithium Carbonate     REACTION: states INTOL to Lithium after yrs of Rx  . Prednisone     REACTION: states INTOL Pred w/ anxiety and bi polar...  . Sulfa Antibiotics Other (See Comments)    Childhood allergy    No prescriptions prior to admission    No results found for this or any previous visit (from the past 48 hour(s)). No results found.  Review of Systems  Constitutional: Negative.   HENT: Negative.   Eyes: Negative.   Respiratory: Positive for wheezing.   Cardiovascular: Negative.   Genitourinary: Negative.   Musculoskeletal: Positive for back pain.  Skin:  Negative.   Neurological: Positive for sensory change and focal weakness.  Endo/Heme/Allergies: Negative.   Psychiatric/Behavioral: Negative.        Bipolar disorder    There were no vitals taken for this visit. Physical Exam hent, nl. Neck, nl. Cv, nl. Lungs few ronchiis. Abdomen, soft. Extremities, nl. NEURO weakness of both quadriceps, with femoral stretch maneuver positive bilaterally.   Assessment/Plan Patient to have decompression and fusion at l3-4. She is aware of risks and benefits  Jaydee Ingman M 07/27/2014, 2:36 PM

## 2014-07-28 ENCOUNTER — Inpatient Hospital Stay (HOSPITAL_COMMUNITY): Payer: Commercial Managed Care - HMO | Admitting: Anesthesiology

## 2014-07-28 ENCOUNTER — Inpatient Hospital Stay (HOSPITAL_COMMUNITY)
Admission: RE | Admit: 2014-07-28 | Discharge: 2014-08-02 | DRG: 460 | Disposition: A | Discharge status: Skilled Nursing Facility | Payer: Commercial Managed Care - HMO | Source: Ambulatory Visit | Attending: Neurosurgery | Admitting: Neurosurgery

## 2014-07-28 ENCOUNTER — Encounter (HOSPITAL_COMMUNITY)
Admission: RE | Disposition: A | Discharge status: Skilled Nursing Facility | Payer: Medicare Other | Source: Ambulatory Visit | Attending: Neurosurgery

## 2014-07-28 ENCOUNTER — Inpatient Hospital Stay (HOSPITAL_COMMUNITY): Payer: Commercial Managed Care - HMO

## 2014-07-28 ENCOUNTER — Encounter (HOSPITAL_COMMUNITY): Payer: Self-pay | Admitting: Surgery

## 2014-07-28 DIAGNOSIS — Z882 Allergy status to sulfonamides status: Secondary | ICD-10-CM | POA: Diagnosis not present

## 2014-07-28 DIAGNOSIS — J449 Chronic obstructive pulmonary disease, unspecified: Secondary | ICD-10-CM | POA: Diagnosis present

## 2014-07-28 DIAGNOSIS — Z7951 Long term (current) use of inhaled steroids: Secondary | ICD-10-CM | POA: Diagnosis not present

## 2014-07-28 DIAGNOSIS — E039 Hypothyroidism, unspecified: Secondary | ICD-10-CM | POA: Diagnosis present

## 2014-07-28 DIAGNOSIS — M4806 Spinal stenosis, lumbar region: Secondary | ICD-10-CM | POA: Diagnosis present

## 2014-07-28 DIAGNOSIS — M810 Age-related osteoporosis without current pathological fracture: Secondary | ICD-10-CM | POA: Diagnosis present

## 2014-07-28 DIAGNOSIS — F319 Bipolar disorder, unspecified: Secondary | ICD-10-CM | POA: Diagnosis present

## 2014-07-28 DIAGNOSIS — J45909 Unspecified asthma, uncomplicated: Secondary | ICD-10-CM | POA: Diagnosis present

## 2014-07-28 DIAGNOSIS — Z79899 Other long term (current) drug therapy: Secondary | ICD-10-CM | POA: Diagnosis not present

## 2014-07-28 DIAGNOSIS — M4326 Fusion of spine, lumbar region: Secondary | ICD-10-CM

## 2014-07-28 DIAGNOSIS — T81599A Other complications of foreign body accidentally left in body following unspecified procedure, initial encounter: Secondary | ICD-10-CM | POA: Diagnosis present

## 2014-07-28 DIAGNOSIS — K219 Gastro-esophageal reflux disease without esophagitis: Secondary | ICD-10-CM | POA: Diagnosis present

## 2014-07-28 DIAGNOSIS — B009 Herpesviral infection, unspecified: Secondary | ICD-10-CM | POA: Diagnosis present

## 2014-07-28 DIAGNOSIS — Z981 Arthrodesis status: Secondary | ICD-10-CM

## 2014-07-28 DIAGNOSIS — Z462 Encounter for fitting and adjustment of other devices related to nervous system and special senses: Secondary | ICD-10-CM | POA: Diagnosis not present

## 2014-07-28 DIAGNOSIS — Z888 Allergy status to other drugs, medicaments and biological substances status: Secondary | ICD-10-CM

## 2014-07-28 DIAGNOSIS — M4316 Spondylolisthesis, lumbar region: Secondary | ICD-10-CM | POA: Diagnosis present

## 2014-07-28 DIAGNOSIS — M545 Low back pain: Secondary | ICD-10-CM | POA: Diagnosis present

## 2014-07-28 DIAGNOSIS — M5416 Radiculopathy, lumbar region: Secondary | ICD-10-CM | POA: Diagnosis present

## 2014-07-28 SURGERY — POSTERIOR LUMBAR FUSION 1 LEVEL
Anesthesia: General | Site: Back

## 2014-07-28 MED ORDER — STERILE WATER FOR INJECTION IJ SOLN
INTRAMUSCULAR | Status: AC
  Filled 2014-07-28: qty 10

## 2014-07-28 MED ORDER — ACETAMINOPHEN 325 MG PO TABS
650.0000 mg | ORAL_TABLET | ORAL | Status: DC | PRN
  Administered 2014-07-29 – 2014-08-01 (×3): 650 mg via ORAL
  Filled 2014-07-28 (×3): qty 2

## 2014-07-28 MED ORDER — SURGIFOAM 100 EX MISC
CUTANEOUS | Status: DC | PRN
  Administered 2014-07-28: 20 mL via TOPICAL

## 2014-07-28 MED ORDER — VECURONIUM BROMIDE 10 MG IV SOLR
INTRAVENOUS | Status: AC
  Filled 2014-07-28: qty 10

## 2014-07-28 MED ORDER — BUPROPION HCL ER (XL) 300 MG PO TB24
300.0000 mg | ORAL_TABLET | Freq: Every day | ORAL | Status: DC
  Administered 2014-07-28 – 2014-08-02 (×6): 300 mg via ORAL
  Filled 2014-07-28 (×7): qty 1

## 2014-07-28 MED ORDER — MORPHINE SULFATE (PF) 1 MG/ML IV SOLN
INTRAVENOUS | Status: AC
  Administered 2014-07-28: 12 mg
  Filled 2014-07-28: qty 25

## 2014-07-28 MED ORDER — TIOTROPIUM BROMIDE MONOHYDRATE 18 MCG IN CAPS
18.0000 ug | ORAL_CAPSULE | Freq: Every day | RESPIRATORY_TRACT | Status: DC
  Administered 2014-07-30 – 2014-08-02 (×4): 18 ug via RESPIRATORY_TRACT
  Filled 2014-07-28: qty 5

## 2014-07-28 MED ORDER — ONDANSETRON HCL 4 MG/2ML IJ SOLN
4.0000 mg | INTRAMUSCULAR | Status: DC | PRN
  Administered 2014-07-29 (×3): 4 mg via INTRAVENOUS
  Filled 2014-07-28 (×3): qty 2

## 2014-07-28 MED ORDER — SODIUM CHLORIDE 0.9 % IV SOLN
INTRAVENOUS | Status: DC
  Administered 2014-07-28: 18:00:00 via INTRAVENOUS
  Administered 2014-07-29: 1000 mL via INTRAVENOUS
  Administered 2014-07-30 (×2): via INTRAVENOUS

## 2014-07-28 MED ORDER — PHENYLEPHRINE HCL 10 MG/ML IJ SOLN
INTRAMUSCULAR | Status: DC | PRN
  Administered 2014-07-28: 120 ug via INTRAVENOUS
  Administered 2014-07-28: 40 ug via INTRAVENOUS

## 2014-07-28 MED ORDER — ALBUTEROL SULFATE (2.5 MG/3ML) 0.083% IN NEBU: 2.5000 mg | INHALATION_SOLUTION | RESPIRATORY_TRACT | Status: DC | PRN

## 2014-07-28 MED ORDER — ONDANSETRON HCL 4 MG/2ML IJ SOLN
INTRAMUSCULAR | Status: AC
  Filled 2014-07-28: qty 2

## 2014-07-28 MED ORDER — ROCURONIUM BROMIDE 100 MG/10ML IV SOLN
INTRAVENOUS | Status: DC | PRN
  Administered 2014-07-28: 50 mg via INTRAVENOUS

## 2014-07-28 MED ORDER — ONDANSETRON HCL 4 MG/2ML IJ SOLN: 4.0000 mg | Freq: Four times a day (QID) | INTRAMUSCULAR | Status: DC | PRN

## 2014-07-28 MED ORDER — FENTANYL CITRATE (PF) 250 MCG/5ML IJ SOLN
INTRAMUSCULAR | Status: AC
  Filled 2014-07-28: qty 5

## 2014-07-28 MED ORDER — MIDAZOLAM HCL 2 MG/2ML IJ SOLN
INTRAMUSCULAR | Status: AC
  Filled 2014-07-28: qty 2

## 2014-07-28 MED ORDER — CEFAZOLIN SODIUM 1-5 GM-% IV SOLN
1.0000 g | Freq: Three times a day (TID) | INTRAVENOUS | Status: AC
  Administered 2014-07-28 (×2): 1 g via INTRAVENOUS
  Filled 2014-07-28 (×2): qty 50

## 2014-07-28 MED ORDER — FLUTICASONE PROPIONATE HFA 44 MCG/ACT IN AERO
1.0000 | INHALATION_SPRAY | Freq: Two times a day (BID) | RESPIRATORY_TRACT | Status: DC
  Administered 2014-07-28 – 2014-08-02 (×9): 1 via RESPIRATORY_TRACT
  Filled 2014-07-28 (×3): qty 10.6

## 2014-07-28 MED ORDER — DIPHENHYDRAMINE HCL 50 MG/ML IJ SOLN: 12.5000 mg | Freq: Four times a day (QID) | INTRAMUSCULAR | Status: DC | PRN

## 2014-07-28 MED ORDER — BUPIVACAINE LIPOSOME 1.3 % IJ SUSP
20.0000 mL | INTRAMUSCULAR | Status: AC
  Filled 2014-07-28: qty 20

## 2014-07-28 MED ORDER — GLYCOPYRROLATE 0.2 MG/ML IJ SOLN
INTRAMUSCULAR | Status: DC | PRN
  Administered 2014-07-28: 0.6 mg via INTRAVENOUS
  Administered 2014-07-28 (×2): 0.2 mg via INTRAVENOUS

## 2014-07-28 MED ORDER — NALOXONE HCL 0.4 MG/ML IJ SOLN
0.4000 mg | INTRAMUSCULAR | Status: DC | PRN
Start: 2014-07-28 — End: 2014-07-29

## 2014-07-28 MED ORDER — ROCURONIUM BROMIDE 50 MG/5ML IV SOLN
INTRAVENOUS | Status: AC
  Filled 2014-07-28: qty 1

## 2014-07-28 MED ORDER — PHENOL 1.4 % MT LIQD: 1.0000 | OROMUCOSAL | Status: DC | PRN

## 2014-07-28 MED ORDER — SUCCINYLCHOLINE CHLORIDE 20 MG/ML IJ SOLN
INTRAMUSCULAR | Status: AC
  Filled 2014-07-28: qty 1

## 2014-07-28 MED ORDER — ACYCLOVIR 400 MG PO TABS
400.0000 mg | ORAL_TABLET | ORAL | Status: DC | PRN
  Administered 2014-07-28: 400 mg via ORAL
  Filled 2014-07-28 (×2): qty 1

## 2014-07-28 MED ORDER — ALBUTEROL SULFATE HFA 108 (90 BASE) MCG/ACT IN AERS
INHALATION_SPRAY | RESPIRATORY_TRACT | Status: AC
  Filled 2014-07-28: qty 6.7

## 2014-07-28 MED ORDER — VANCOMYCIN HCL 1000 MG IV SOLR
INTRAVENOUS | Status: DC | PRN
  Administered 2014-07-28: 1000 mg

## 2014-07-28 MED ORDER — SODIUM CHLORIDE 0.9 % IV SOLN: 250.0000 mL | INTRAVENOUS | Status: DC

## 2014-07-28 MED ORDER — 0.9 % SODIUM CHLORIDE (POUR BTL) OPTIME
TOPICAL | Status: DC | PRN
  Administered 2014-07-28: 1000 mL

## 2014-07-28 MED ORDER — DEXAMETHASONE SODIUM PHOSPHATE 10 MG/ML IJ SOLN
INTRAMUSCULAR | Status: DC | PRN
  Administered 2014-07-28: 10 mg via INTRAVENOUS

## 2014-07-28 MED ORDER — DIPHENHYDRAMINE HCL 12.5 MG/5ML PO ELIX: 12.5000 mg | ORAL_SOLUTION | Freq: Four times a day (QID) | ORAL | Status: DC | PRN

## 2014-07-28 MED ORDER — VECURONIUM BROMIDE 10 MG IV SOLR
INTRAVENOUS | Status: DC | PRN
  Administered 2014-07-28 (×2): 2 mg via INTRAVENOUS

## 2014-07-28 MED ORDER — SODIUM CHLORIDE 0.9 % IV SOLN
10.0000 mg | INTRAVENOUS | Status: DC | PRN
  Administered 2014-07-28: 10 ug/min via INTRAVENOUS

## 2014-07-28 MED ORDER — SODIUM CHLORIDE 0.9 % IJ SOLN
3.0000 mL | Freq: Two times a day (BID) | INTRAMUSCULAR | Status: DC
  Administered 2014-07-28 – 2014-08-01 (×7): 3 mL via INTRAVENOUS

## 2014-07-28 MED ORDER — PANTOPRAZOLE SODIUM 40 MG PO TBEC
40.0000 mg | DELAYED_RELEASE_TABLET | Freq: Every day | ORAL | Status: DC
  Administered 2014-07-28 – 2014-08-02 (×6): 40 mg via ORAL
  Filled 2014-07-28 (×6): qty 1

## 2014-07-28 MED ORDER — NEOSTIGMINE METHYLSULFATE 10 MG/10ML IV SOLN
INTRAVENOUS | Status: DC | PRN
Start: 2014-07-28 — End: 2014-07-28
  Administered 2014-07-28: 5 mg via INTRAVENOUS

## 2014-07-28 MED ORDER — ARTIFICIAL TEARS OP OINT
TOPICAL_OINTMENT | OPHTHALMIC | Status: DC | PRN
  Administered 2014-07-28: 1 via OPHTHALMIC

## 2014-07-28 MED ORDER — HYDROMORPHONE HCL 1 MG/ML IJ SOLN
INTRAMUSCULAR | Status: AC
  Filled 2014-07-28: qty 1

## 2014-07-28 MED ORDER — LIDOCAINE HCL 4 % MT SOLN
OROMUCOSAL | Status: DC | PRN
  Administered 2014-07-28: 4 mL via TOPICAL

## 2014-07-28 MED ORDER — LIDOCAINE HCL (CARDIAC) 20 MG/ML IV SOLN
INTRAVENOUS | Status: AC
  Filled 2014-07-28: qty 10

## 2014-07-28 MED ORDER — PROPOFOL 10 MG/ML IV BOLUS
INTRAVENOUS | Status: AC
  Filled 2014-07-28: qty 20

## 2014-07-28 MED ORDER — ONDANSETRON HCL 4 MG/2ML IJ SOLN: 4.0000 mg | Freq: Once | INTRAMUSCULAR | Status: DC | PRN

## 2014-07-28 MED ORDER — DIAZEPAM 5 MG PO TABS
5.0000 mg | ORAL_TABLET | Freq: Four times a day (QID) | ORAL | Status: DC | PRN
  Administered 2014-07-29 – 2014-08-01 (×7): 5 mg via ORAL
  Filled 2014-07-28 (×7): qty 1

## 2014-07-28 MED ORDER — SODIUM CHLORIDE 0.9 % IJ SOLN: 9.0000 mL | INTRAMUSCULAR | Status: DC | PRN

## 2014-07-28 MED ORDER — MENTHOL 3 MG MT LOZG: 1.0000 | LOZENGE | OROMUCOSAL | Status: DC | PRN

## 2014-07-28 MED ORDER — ARTIFICIAL TEARS OP OINT
TOPICAL_OINTMENT | OPHTHALMIC | Status: AC
  Filled 2014-07-28: qty 3.5

## 2014-07-28 MED ORDER — ZOLPIDEM TARTRATE 5 MG PO TABS
5.0000 mg | ORAL_TABLET | Freq: Every evening | ORAL | Status: DC | PRN
  Administered 2014-07-29: 5 mg via ORAL
  Filled 2014-07-28: qty 1

## 2014-07-28 MED ORDER — ALBUTEROL SULFATE HFA 108 (90 BASE) MCG/ACT IN AERS
INHALATION_SPRAY | RESPIRATORY_TRACT | Status: DC | PRN
  Administered 2014-07-28 (×2): 4 via RESPIRATORY_TRACT

## 2014-07-28 MED ORDER — MORPHINE SULFATE (PF) 1 MG/ML IV SOLN
INTRAVENOUS | Status: DC
  Administered 2014-07-28: 1.5 mg via INTRAVENOUS
  Administered 2014-07-28: 12:00:00 via INTRAVENOUS
  Administered 2014-07-28: 15 mg via INTRAVENOUS
  Administered 2014-07-28: 17:00:00 via INTRAVENOUS
  Administered 2014-07-29: 11.92 mg via INTRAVENOUS
  Administered 2014-07-29: 10.5 mg via INTRAVENOUS
  Filled 2014-07-28 (×2): qty 25

## 2014-07-28 MED ORDER — OXCARBAZEPINE 300 MG PO TABS
600.0000 mg | ORAL_TABLET | Freq: Two times a day (BID) | ORAL | Status: DC
  Administered 2014-07-28 – 2014-08-02 (×11): 600 mg via ORAL
  Filled 2014-07-28 (×14): qty 2

## 2014-07-28 MED ORDER — EPHEDRINE SULFATE 50 MG/ML IJ SOLN
INTRAMUSCULAR | Status: AC
  Filled 2014-07-28: qty 1

## 2014-07-28 MED ORDER — HYDROMORPHONE HCL 1 MG/ML IJ SOLN
0.5000 mg | INTRAMUSCULAR | Status: DC | PRN
  Administered 2014-07-28: 0.5 mg via INTRAVENOUS

## 2014-07-28 MED ORDER — WHITE PETROLATUM GEL
Status: AC
  Administered 2014-07-28: 15:00:00
  Filled 2014-07-28: qty 1

## 2014-07-28 MED ORDER — KETAMINE HCL 100 MG/ML IJ SOLN
INTRAMUSCULAR | Status: AC
  Administered 2014-07-28 (×2): 25 mg via INTRAVENOUS
  Filled 2014-07-28: qty 1

## 2014-07-28 MED ORDER — LIDOCAINE HCL (CARDIAC) 20 MG/ML IV SOLN
INTRAVENOUS | Status: DC | PRN
  Administered 2014-07-28: 80 mg via INTRAVENOUS

## 2014-07-28 MED ORDER — CEFAZOLIN SODIUM-DEXTROSE 2-3 GM-% IV SOLR
INTRAVENOUS | Status: AC
  Filled 2014-07-28: qty 50

## 2014-07-28 MED ORDER — FLUOXETINE HCL 20 MG PO CAPS
40.0000 mg | ORAL_CAPSULE | Freq: Every morning | ORAL | Status: DC
  Administered 2014-07-28 – 2014-08-02 (×6): 40 mg via ORAL
  Filled 2014-07-28 (×10): qty 2

## 2014-07-28 MED ORDER — ACETAMINOPHEN 650 MG RE SUPP: 650.0000 mg | RECTAL | Status: DC | PRN

## 2014-07-28 MED ORDER — GABAPENTIN 100 MG PO CAPS
100.0000 mg | ORAL_CAPSULE | Freq: Two times a day (BID) | ORAL | Status: DC
  Administered 2014-07-28 – 2014-08-02 (×10): 100 mg via ORAL
  Filled 2014-07-28 (×11): qty 1

## 2014-07-28 MED ORDER — PROPOFOL 10 MG/ML IV BOLUS
INTRAVENOUS | Status: DC | PRN
  Administered 2014-07-28: 30 mg via INTRAVENOUS
  Administered 2014-07-28: 130 mg via INTRAVENOUS

## 2014-07-28 MED ORDER — VANCOMYCIN HCL 1000 MG IV SOLR
INTRAVENOUS | Status: AC
  Filled 2014-07-28: qty 1000

## 2014-07-28 MED ORDER — SODIUM CHLORIDE 0.9 % IJ SOLN: 3.0000 mL | INTRAMUSCULAR | Status: DC | PRN

## 2014-07-28 MED ORDER — LACTATED RINGERS IV SOLN
INTRAVENOUS | Status: DC | PRN
  Administered 2014-07-28 (×3): via INTRAVENOUS

## 2014-07-28 MED ORDER — SODIUM CHLORIDE 0.9 % IJ SOLN
INTRAMUSCULAR | Status: AC
  Filled 2014-07-28: qty 10

## 2014-07-28 MED ORDER — LEVOTHYROXINE SODIUM 125 MCG PO TABS
125.0000 ug | ORAL_TABLET | Freq: Every day | ORAL | Status: DC
  Administered 2014-07-29 – 2014-08-02 (×5): 125 ug via ORAL
  Filled 2014-07-28 (×6): qty 1

## 2014-07-28 MED ORDER — FENTANYL CITRATE (PF) 100 MCG/2ML IJ SOLN
INTRAMUSCULAR | Status: DC | PRN
  Administered 2014-07-28 (×2): 50 ug via INTRAVENOUS
  Administered 2014-07-28 (×3): 100 ug via INTRAVENOUS
  Administered 2014-07-28 (×2): 50 ug via INTRAVENOUS

## 2014-07-28 MED ORDER — ALPRAZOLAM 0.5 MG PO TABS
0.5000 mg | ORAL_TABLET | Freq: Two times a day (BID) | ORAL | Status: DC | PRN
  Administered 2014-07-28 – 2014-08-02 (×10): 0.5 mg via ORAL
  Filled 2014-07-28 (×10): qty 1

## 2014-07-28 MED ORDER — ONDANSETRON HCL 4 MG/2ML IJ SOLN
INTRAMUSCULAR | Status: DC | PRN
  Administered 2014-07-28: 4 mg via INTRAVENOUS

## 2014-07-28 MED ORDER — QUETIAPINE FUMARATE 50 MG PO TABS
50.0000 mg | ORAL_TABLET | Freq: Every day | ORAL | Status: DC
  Administered 2014-07-28: 50 mg via ORAL
  Administered 2014-07-29 – 2014-07-31 (×3): 100 mg via ORAL
  Administered 2014-08-01: 50 mg via ORAL
  Filled 2014-07-28 (×3): qty 2
  Filled 2014-07-28: qty 1
  Filled 2014-07-28: qty 2

## 2014-07-28 MED ORDER — PHENYLEPHRINE 40 MCG/ML (10ML) SYRINGE FOR IV PUSH (FOR BLOOD PRESSURE SUPPORT)
PREFILLED_SYRINGE | INTRAVENOUS | Status: AC
  Filled 2014-07-28: qty 20

## 2014-07-28 MED ORDER — OXYCODONE-ACETAMINOPHEN 5-325 MG PO TABS
1.0000 | ORAL_TABLET | ORAL | Status: DC | PRN
  Administered 2014-07-28 – 2014-07-29 (×3): 2 via ORAL
  Filled 2014-07-28 (×3): qty 2

## 2014-07-28 MED FILL — Heparin Sodium (Porcine) Inj 1000 Unit/ML: INTRAMUSCULAR | Qty: 30 | Status: AC

## 2014-07-28 MED FILL — Sodium Chloride IV Soln 0.9%: INTRAVENOUS | Qty: 1000 | Status: AC

## 2014-07-28 SURGICAL SUPPLY — 81 items
APL SKNCLS STERI-STRIP NONHPOA (GAUZE/BANDAGES/DRESSINGS) ×1
BENZOIN TINCTURE PRP APPL 2/3 (GAUZE/BANDAGES/DRESSINGS) ×2 IMPLANT
BLADE CLIPPER SURG (BLADE) IMPLANT
BUR ACORN 6.0 (BURR) ×2 IMPLANT
BUR MATCHSTICK NEURO 3.0 LAGG (BURR) ×2 IMPLANT
CANISTER SUCT 3000ML PPV (MISCELLANEOUS) ×2 IMPLANT
CAP LOCKING THREADED (Cap) ×2 IMPLANT
CLAMP CONNECTOR 6.5-6.5MM (Clamp) ×1 IMPLANT
CLAMP SINGLE ROD TO ROD (Clamp) ×1 IMPLANT
CONT SPEC 4OZ CLIKSEAL STRL BL (MISCELLANEOUS) ×2 IMPLANT
COVER BACK TABLE 60X90IN (DRAPES) ×2 IMPLANT
DRAPE C-ARM 42X72 X-RAY (DRAPES) ×4 IMPLANT
DRAPE LAPAROTOMY 100X72X124 (DRAPES) ×2 IMPLANT
DRAPE POUCH INSTRU U-SHP 10X18 (DRAPES) ×2 IMPLANT
DRSG PAD ABDOMINAL 8X10 ST (GAUZE/BANDAGES/DRESSINGS) IMPLANT
DURAPREP 26ML APPLICATOR (WOUND CARE) ×2 IMPLANT
ELECT BLADE 4.0 EZ CLEAN MEGAD (MISCELLANEOUS) ×2
ELECT REM PT RETURN 9FT ADLT (ELECTROSURGICAL) ×2
ELECTRODE BLDE 4.0 EZ CLN MEGD (MISCELLANEOUS) IMPLANT
ELECTRODE REM PT RTRN 9FT ADLT (ELECTROSURGICAL) ×1 IMPLANT
EVACUATOR 1/8 PVC DRAIN (DRAIN) IMPLANT
GAUZE SPONGE 4X4 12PLY STRL (GAUZE/BANDAGES/DRESSINGS) ×2 IMPLANT
GAUZE SPONGE 4X4 16PLY XRAY LF (GAUZE/BANDAGES/DRESSINGS) ×2 IMPLANT
GLOVE BIO SURGEON STRL SZ8 (GLOVE) ×1 IMPLANT
GLOVE BIOGEL M 8.0 STRL (GLOVE) ×3 IMPLANT
GLOVE BIOGEL PI IND STRL 7.5 (GLOVE) IMPLANT
GLOVE BIOGEL PI IND STRL 8 (GLOVE) IMPLANT
GLOVE BIOGEL PI INDICATOR 7.5 (GLOVE) ×1
GLOVE BIOGEL PI INDICATOR 8 (GLOVE) ×1
GLOVE ECLIPSE 7.5 STRL STRAW (GLOVE) ×1 IMPLANT
GLOVE EXAM NITRILE LRG STRL (GLOVE) IMPLANT
GLOVE EXAM NITRILE MD LF STRL (GLOVE) IMPLANT
GLOVE EXAM NITRILE XL STR (GLOVE) IMPLANT
GLOVE EXAM NITRILE XS STR PU (GLOVE) IMPLANT
GLOVE INDICATOR 8.5 STRL (GLOVE) ×1 IMPLANT
GOWN STRL REUS W/ TWL LRG LVL3 (GOWN DISPOSABLE) ×1 IMPLANT
GOWN STRL REUS W/ TWL XL LVL3 (GOWN DISPOSABLE) IMPLANT
GOWN STRL REUS W/TWL 2XL LVL3 (GOWN DISPOSABLE) ×1 IMPLANT
GOWN STRL REUS W/TWL LRG LVL3 (GOWN DISPOSABLE) ×4
GOWN STRL REUS W/TWL XL LVL3 (GOWN DISPOSABLE) ×4
IMPL CROSSLINK 32-40 (Neuro Prosthesis/Implant) IMPLANT
IMPLANT CROSSLINK 32-40 (Neuro Prosthesis/Implant) ×2 IMPLANT
IMPLANT RISE 8X22 9-15MM (Neuro Prosthesis/Implant) ×2 IMPLANT
KIT BASIN OR (CUSTOM PROCEDURE TRAY) ×2 IMPLANT
KIT INFUSE MEDIUM (Orthopedic Implant) ×1 IMPLANT
KIT ROOM TURNOVER OR (KITS) ×2 IMPLANT
NDL HYPO 18GX1.5 BLUNT FILL (NEEDLE) IMPLANT
NDL HYPO 21X1.5 SAFETY (NEEDLE) IMPLANT
NDL HYPO 25X1 1.5 SAFETY (NEEDLE) IMPLANT
NEEDLE HYPO 18GX1.5 BLUNT FILL (NEEDLE) IMPLANT
NEEDLE HYPO 21X1.5 SAFETY (NEEDLE) IMPLANT
NEEDLE HYPO 25X1 1.5 SAFETY (NEEDLE) IMPLANT
NS IRRIG 1000ML POUR BTL (IV SOLUTION) ×2 IMPLANT
PACK FOAM VITOSS 10CC (Orthopedic Implant) ×1 IMPLANT
PACK LAMINECTOMY NEURO (CUSTOM PROCEDURE TRAY) ×2 IMPLANT
PAD ARMBOARD 7.5X6 YLW CONV (MISCELLANEOUS) ×6 IMPLANT
PATTIES SURGICAL .5 X1 (DISPOSABLE) ×2 IMPLANT
PATTIES SURGICAL .5 X3 (DISPOSABLE) IMPLANT
PATTIES SURGICAL 1X1 (DISPOSABLE) ×1 IMPLANT
RASP 3.0MM (RASP) ×1 IMPLANT
ROD 65MM SPINAL (Rod) ×2 IMPLANT
ROD SPNL 5.5 CREO TI 65 (Rod) IMPLANT
Revere Addition IMPLANT
SCREW SPINE 40X5.5XPA CREO (Screw) IMPLANT
SCREW SPINE CREO 5.5X40 (Screw) ×4 IMPLANT
SPONGE LAP 4X18 X RAY DECT (DISPOSABLE) IMPLANT
SPONGE NEURO XRAY DETECT 1X3 (DISPOSABLE) IMPLANT
SPONGE SURGIFOAM ABS GEL 100 (HEMOSTASIS) ×2 IMPLANT
STAPLER VISISTAT 35W (STAPLE) ×1 IMPLANT
STRIP CLOSURE SKIN 1/2X4 (GAUZE/BANDAGES/DRESSINGS) ×2 IMPLANT
SUT VIC AB 1 CT1 18XBRD ANBCTR (SUTURE) ×2 IMPLANT
SUT VIC AB 1 CT1 8-18 (SUTURE) ×4
SUT VIC AB 2-0 CP2 18 (SUTURE) ×2 IMPLANT
SUT VIC AB 3-0 SH 8-18 (SUTURE) ×2 IMPLANT
SYR 20CC LL (SYRINGE) IMPLANT
SYR 20ML ECCENTRIC (SYRINGE) ×2 IMPLANT
SYR 5ML LL (SYRINGE) IMPLANT
TOWEL OR 17X24 6PK STRL BLUE (TOWEL DISPOSABLE) ×2 IMPLANT
TOWEL OR 17X26 10 PK STRL BLUE (TOWEL DISPOSABLE) ×2 IMPLANT
TRAY FOLEY CATH 14FRSI W/METER (CATHETERS) ×2 IMPLANT
WATER STERILE IRR 1000ML POUR (IV SOLUTION) ×2 IMPLANT

## 2014-07-28 NOTE — Anesthesia Postprocedure Evaluation (Signed)
  Anesthesia Post-op Note  Patient: Victoria Levine  Procedure(s) Performed: Procedure(s) with comments: L3-4 Posterior lumbar interbody fusion, augmentation of fusion (N/A) - L3-4 Posterior lumbar interbody fusion, augmentation of fusion  Patient Location: PACU  Anesthesia Type:General  Level of Consciousness: awake, alert , oriented and patient cooperative  Airway and Oxygen Therapy: Patient Spontanous Breathing  Post-op Pain: mild  Post-op Assessment: Post-op Vital signs reviewed, Patient's Cardiovascular Status Stable, Respiratory Function Stable, Patent Airway, No signs of Nausea or vomiting and Pain level controlled  Post-op Vital Signs: stable  Last Vitals:  Filed Vitals:   07/28/14 1241  BP:   Pulse: 76  Temp:   Resp: 11    Complications: No apparent anesthesia complications

## 2014-07-28 NOTE — Transfer of Care (Signed)
Immediate Anesthesia Transfer of Care Note  Patient: Victoria Levine  Procedure(s) Performed: Procedure(s) with comments: L3-4 Posterior lumbar interbody fusion, augmentation of fusion (N/A) - L3-4 Posterior lumbar interbody fusion, augmentation of fusion  Patient Location: PACU  Anesthesia Type:General  Level of Consciousness: sedated, patient cooperative and responds to stimulation  Airway & Oxygen Therapy: Patient Spontanous Breathing and Patient connected to nasal cannula oxygen  Post-op Assessment: Report given to RN, Post -op Vital signs reviewed and stable, Patient moving all extremities and Patient moving all extremities X 4  Post vital signs: Reviewed and stable  Last Vitals:  Filed Vitals:   07/28/14 0731  BP: 103/72  Pulse: 87  Temp: 36.8 C  Resp: 18    Complications: No apparent anesthesia complications

## 2014-07-28 NOTE — Anesthesia Preprocedure Evaluation (Signed)
Anesthesia Evaluation  Patient identified by MRN, date of birth, ID band Patient awake    Reviewed: Allergy & Precautions, NPO status , Patient's Chart, lab work & pertinent test results  Airway Mallampati: I       Dental   Pulmonary asthma , COPD + rhonchi         Cardiovascular Normal cardiovascular exam    Neuro/Psych  Headaches, Depression Bipolar Disorder    GI/Hepatic hiatal hernia, GERD-  ,(+)     substance abuse  ,   Endo/Other  Hypothyroidism   Renal/GU      Musculoskeletal  (+) Arthritis -,   Abdominal   Peds  Hematology   Anesthesia Other Findings   Reproductive/Obstetrics                             Anesthesia Physical Anesthesia Plan  ASA: II  Anesthesia Plan: General   Post-op Pain Management:    Induction: Intravenous  Airway Management Planned: Oral ETT  Additional Equipment:   Intra-op Plan:   Post-operative Plan: Extubation in OR  Informed Consent: I have reviewed the patients History and Physical, chart, labs and discussed the procedure including the risks, benefits and alternatives for the proposed anesthesia with the patient or authorized representative who has indicated his/her understanding and acceptance.     Plan Discussed with: CRNA, Anesthesiologist and Surgeon  Anesthesia Plan Comments:         Anesthesia Quick Evaluation

## 2014-07-28 NOTE — Progress Notes (Signed)
Patient arrived to 4N10. Patient is alert and oriented, very sleepy from anesthesia per PACU RN. Patient's neuro assessment stable, VSS, patient rating pain "10/10." Patient was started on morphine PCA pump while in PACU. Gauze dressing covering patient's incision is clean, dry, and intact. Hemovac dressing is clean, dry, and intact, suction, drainage is bloody. SCDs on patient. Patient oriented to room and floor. Call bell within patient's reach. Will continue to monitor closely.

## 2014-07-28 NOTE — Anesthesia Procedure Notes (Addendum)
Procedure Name: Intubation Date/Time: 07/28/2014 8:34 AM Performed by: Jacquiline Doe A Pre-anesthesia Checklist: Patient identified, Timeout performed, Emergency Drugs available, Suction available and Patient being monitored Patient Re-evaluated:Patient Re-evaluated prior to inductionOxygen Delivery Method: Circle system utilized Preoxygenation: Pre-oxygenation with 100% oxygen Intubation Type: IV induction and Cricoid Pressure applied Ventilation: Mask ventilation without difficulty Laryngoscope Size: Mac and 4 Grade View: Grade I Tube type: Oral Tube size: 7.0 mm Number of attempts: 1 Airway Equipment and Method: Stylet and LTA kit utilized Placement Confirmation: ETT inserted through vocal cords under direct vision,  breath sounds checked- equal and bilateral and positive ETCO2 Secured at: 21 cm Tube secured with: Tape Dental Injury: Teeth and Oropharynx as per pre-operative assessment

## 2014-07-28 NOTE — Addendum Note (Signed)
Addendum  created 07/28/14 1335 by Fidela Juneau, CRNA   Modules edited: Anesthesia Blocks and Procedures, Anesthesia Medication Administration, Clinical Notes   Clinical Notes:  File: [id number]

## 2014-07-28 NOTE — Progress Notes (Signed)
Physical medicine and rehabilitation consult requested. Patient status post lumbar L3-4 posterior lumbar interbody fusion 07/28/2014. Await physical and occupational therapy evaluations and follow-up recommendations. It is doubtful that insurance will justify inpatient rehabilitation services.

## 2014-07-29 MED ORDER — OXYCODONE HCL 5 MG PO TABS
15.0000 mg | ORAL_TABLET | ORAL | Status: DC | PRN
  Administered 2014-07-29 – 2014-08-02 (×17): 15 mg via ORAL
  Filled 2014-07-29 (×17): qty 3

## 2014-07-29 MED ORDER — BISACODYL 5 MG PO TBEC
5.0000 mg | DELAYED_RELEASE_TABLET | Freq: Every day | ORAL | Status: DC | PRN
  Administered 2014-07-30 – 2014-07-31 (×2): 5 mg via ORAL
  Filled 2014-07-29 (×2): qty 1

## 2014-07-29 MED ORDER — TAMSULOSIN HCL 0.4 MG PO CAPS
0.4000 mg | ORAL_CAPSULE | Freq: Every day | ORAL | Status: DC
  Administered 2014-07-29 – 2014-08-02 (×5): 0.4 mg via ORAL
  Filled 2014-07-29 (×5): qty 1

## 2014-07-29 MED ORDER — PROMETHAZINE HCL 25 MG/ML IJ SOLN
12.5000 mg | Freq: Four times a day (QID) | INTRAMUSCULAR | Status: DC | PRN
  Administered 2014-07-29 – 2014-07-30 (×3): 12.5 mg via INTRAVENOUS
  Filled 2014-07-29 (×3): qty 1

## 2014-07-29 MED ORDER — POLYETHYLENE GLYCOL 3350 17 G PO PACK
17.0000 g | PACK | Freq: Every day | ORAL | Status: DC | PRN
  Administered 2014-07-30 – 2014-08-01 (×5): 17 g via ORAL
  Filled 2014-07-29 (×5): qty 1

## 2014-07-29 NOTE — Clinical Social Work Note (Signed)
CSW consult acknowledged:  Clinical Education officer, museum received consult for SNF placement. PT/OT recommending Home Health.   Clinical Social Worker will sign off for now as social work intervention is no longer needed. Please consult [place] again if new need arises.  Glendon Axe, MSW, LCSWA [phone] 07/29/2014 4:22 PM

## 2014-07-29 NOTE — Progress Notes (Signed)
Physical medicine and rehabilitation consult requested chart reviewed. Therapies initiated at this time recommendations are discharged to home with home therapies. Hold on formal rehabilitation consult at this time with recommendations of discharge to home

## 2014-07-29 NOTE — Progress Notes (Signed)
OT EVALUATION  07/29/14 0700  OT Visit Information  Last OT Received On 07/29/14  Assistance Needed +1  History of Present Illness 64 yo female s/p spinal cord stimulator Bil L3 laminectomy and facetectomy, pedicle screws at L3 posterolateral arthrodesis with teh vitoss and BMP PMH:  Bipolar, depression, herpes, migraines, back surg  Precautions  Precautions Back  Precaution Comments provided back handout  Required Braces or Orthoses Spinal Brace  Spinal Brace Lumbar corset;Applied in sitting position  Home Living  Family/patient expects to be discharged to: Private residence  Living Arrangements Alone  Available Help at Discharge Friend(s);Available PRN/intermittently  Type of Home Other(Comment) (townhome)  Home Access Level entry  Home Layout One level  Bathroom Shower/Tub Walk-in shower  [place] - 2 wheels  Prior Function  Level of Independence Independent  Communication  Communication No difficulties  Pain Assessment  Pain Assessment 0-10  Pain Score 8  Pain Location back  Pain Descriptors / Indicators Constant;Aching  Cognition  Arousal/Alertness Awake/alert  Behavior During Therapy WFL for tasks assessed/performed  Overall Cognitive Status Within Functional Limits for tasks assessed  Upper Extremity Assessment  Upper Extremity Assessment Overall WFL for tasks assessed  Lower Extremity Assessment  Lower Extremity Assessment Defer to PT evaluation  Cervical / Trunk Assessment  Cervical / Trunk Assessment Other exceptions (back surg)  ADL  Overall ADL's  Needs assistance/impaired  Eating/Feeding Modified independent;Sitting  Grooming Wash/dry face;Set up;Sitting  Upper Body Bathing Minimal assitance;Sitting  Upper Body Dressing  Min guard;Sitting  Upper Body Dressing Details (indicate cue type and reason) able to don brace with mod cues  Lower Body Dressing Moderate assistance;Sit to/from Retail buyer  Minimal assistance;Ambulation;RW;BSC  Functional mobility during ADLs Minimal assistance;Rolling walker  General ADL Comments Pt very anxious on arrival and in bed supine on bed pan. pt agreeable to oob to 3n1. Pt needed cues to wait for room setup due to impulsive attempt to exit bed. pt transfered to 3n1 but unable to void bladder. PT with foley d/c at 6am. Pt positioned in chair and educated on back precautions. tp able to recall 2 out 3 at end of session  Vision- History  Baseline Vision/History Wears glasses  Wears Glasses Reading only  Bed Mobility  Overal bed mobility Needs Assistance  Bed Mobility Supine to Sit  Supine to sit Min assist  General bed mobility comments cues for sequence and back safety  Transfers  Overall transfer level Needs assistance  Equipment used Rolling walker (2 wheeled)  Transfers Sit to/from Stand  Sit to Stand Min assist  General transfer comment cues for hand placement  General Comments  General comments (skin integrity, edema, etc.) dressing reinforced and drain intact at this time  OT - End of Session  Equipment Utilized During Treatment Gait belt;Rolling walker;Back brace  Activity Tolerance Patient tolerated treatment well;Other (comment) (anxious)  Patient left in chair;with call bell/phone within reach  Nurse Communication Mobility status;Precautions  OT Assessment  OT Therapy Diagnosis  Generalized weakness;Acute pain  OT Recommendation/Assessment Patient needs continued OT Services  OT Problem List Decreased strength;Decreased activity tolerance;Impaired balance (sitting and/or standing);Decreased safety awareness;Decreased knowledge of use of DME or AE;Decreased knowledge of precautions;Pain  OT Plan  OT Frequency (ACUTE ONLY) Min 2X/week  OT Treatment/Interventions (ACUTE ONLY) Self-care/ADL training;Therapeutic exercise;DME and/or AE instruction;Therapeutic activities;Patient/family education;Balance training  OT Recommendation  Follow Up  Recommendations Home health OT;Supervision/Assistance - 24 hour  OT Equipment None recommended by OT  Individuals  Consulted  Consulted and Agree with Results and Recommendations Patient  Acute Rehab OT Goals  Patient Stated Goal to void bladder  OT Goal Formulation With patient  Time For Goal Achievement 08/12/14  Potential to Achieve Goals Good  OT Time Calculation  OT Start Time (ACUTE ONLY) 0734  OT Stop Time (ACUTE ONLY) 0805  OT Time Calculation (min) 31 min  OT General Charges  $OT Visit 1 Procedure  OT Evaluation  $Initial OT Evaluation Tier I 1 Procedure  OT Treatments  $Self Care/Home Management  8-22 mins  Written Expression  Dominant Hand Right    Jeri Modena   OTR/L Pager: [phone] Office: [phone] .

## 2014-07-29 NOTE — Evaluation (Signed)
Physical Therapy Evaluation Patient Details Name: Victoria Levine MRN: [id number] DOB: September 14, 1950 Today's Date: 07/29/2014   History of Present Illness  64 yo female s/p spinal cord stimulator Bil L3 laminectomy and facetectomy, pedicle screws at L3 posterolateral arthrodesis with the vitoss and BMP PMH:  Bipolar, depression, herpes, migraines, back surg  Clinical Impression  Pt admitted with above diagnosis. Pt currently with functional limitations due to the deficits listed below (see PT Problem List). Pt will benefit from skilled PT to increase their independence and safety with mobility to allow discharge to the venue listed below.  At time of evaluation, pt very anxious with some restlessness and impulsivity, but feel this may be due to medications.  Hopefully pt will be more clear in further treatments in order to progress to home with HHPT.  If she does not clear up, may need to consider a different d/c plan.  Will continue to monitor.     Follow Up Recommendations Home health PT- depending on if cognition clears up- if not, then CIR vs SNF    Equipment Recommendations  None recommended by PT    Recommendations for Other Services       Precautions / Restrictions Precautions Precautions: Back Precaution Comments: Could not recall no BAT precautions, could remember not to lift anything Required Braces or Orthoses: Spinal Brace Spinal Brace: Lumbar corset;Applied in sitting position      Mobility  Bed Mobility   Bed Mobility: Rolling;Sidelying to Sit Rolling: Min guard Sidelying to sit: Min guard       General bed mobility comments: min/guard, but moves very slowly.  Very anxious with mobility.  Cues for breathing and relaxation.  Transfers Overall transfer level: Needs assistance Equipment used: Rolling walker (2 wheeled) Transfers: Sit to/from Stand Sit to Stand: Min assist;From elevated surface;Mod assist         General transfer comment: cues fro hand  placement.   MIN A from bed and recliner and MOD A from toilet.  Did not have time to set up [facility] over toilet due to impulsivity.  Ambulation/Gait Ambulation/Gait assistance: Min assist Ambulation Distance (Feet): 10 Feet (x2) Assistive device: Rolling walker (2 wheeled) Gait Pattern/deviations: Decreased step length - right;Decreased step length - left;Drifts right/left;Shuffle     General Gait Details: Pt ambulated ~ 5 feet with shuffeling steps and then had to sit due to feeling dizzy and nauseous.  After sitting ~ 30 secs she ambulated to bathroom to use the toilet. Increased step length, but very unsteady, difficulty following instructions with turning and for safety.    Stairs            Wheelchair Mobility    Modified Rankin (Stroke Patients Only)       Balance Overall balance assessment: Needs assistance           Standing balance-Leahy Scale: Fair Standing balance comment: requires RW, general unsteadiness- possibly due to medications?                             Pertinent Vitals/Pain Pain Assessment: 0-10 Pain Score: 8     Home Living Family/patient expects to be discharged to:: Private residence Living Arrangements: Alone Available Help at Discharge: Friend(s);Available PRN/intermittently Type of Home: Other(Comment) (townhome) Home Access: Level entry     Home Layout: One level Home Equipment: Walker - 2 wheels      Prior Function Level of Independence: Independent  Hand Dominance   Dominant Hand: Right    Extremity/Trunk Assessment   Upper Extremity Assessment: Defer to OT evaluation           Lower Extremity Assessment: Overall WFL for tasks assessed      Cervical / Trunk Assessment: Other exceptions (back surgery)  Communication   Communication: No difficulties  Cognition Arousal/Alertness: Awake/alert Behavior During Therapy: Impulsive;Restless;Anxious (supsect some of this is due to  medications) Overall Cognitive Status: No family/caregiver present to determine baseline cognitive functioning Area of Impairment: Following commands;Safety/judgement     Memory: Decreased recall of precautions Following Commands: Follows multi-step commands inconsistently Safety/Judgement: Decreased awareness of safety          General Comments General comments (skin integrity, edema, etc.): Pt restless and impulsive.  Difficult time following commands.    Exercises        Assessment/Plan    PT Assessment Patient needs continued PT services  PT Diagnosis Difficulty walking;Acute pain   PT Problem List Decreased activity tolerance;Decreased balance;Decreased mobility;Decreased coordination;Decreased safety awareness;Decreased knowledge of precautions;Decreased cognition  PT Treatment Interventions DME instruction;Gait training   PT Goals (Current goals can be found in the Care Plan section) Acute Rehab PT Goals Patient Stated Goal: none stated, relucantly agreed to work with PT PT Goal Formulation: With patient Time For Goal Achievement: 08/12/14 Potential to Achieve Goals: Good    Frequency Min 5X/week   Barriers to discharge Decreased caregiver support      Co-evaluation               End of Session Equipment Utilized During Treatment: Gait belt;Back brace Activity Tolerance: Treatment limited secondary to agitation;Patient limited by pain Patient left: in bed;with bed alarm set;with call bell/phone within reach Nurse Communication: Mobility status;Patient requests pain meds         Time: 1339-1405 PT Time Calculation (min) (ACUTE ONLY): 26 min   Charges:   PT Evaluation $Initial PT Evaluation Tier I: 1 Procedure PT Treatments $Gait Training: 8-22 mins   PT G Codes:        Reiko Vinje LUBECK 07/29/2014, 3:42 PM

## 2014-07-29 NOTE — Op Note (Signed)
NAME:  Victoria Levine, Victoria Levine NO.:  [id number]  MEDICAL RECORD NO.:  [id number]  LOCATION:  4N10C                        FACILITY:  [place]  PHYSICIAN:  Leeroy Cha, M.D.   DATE OF BIRTH:  Dec 17, 1950  DATE OF PROCEDURE:  07/28/2014 DATE OF DISCHARGE:                              OPERATIVE REPORT   PREOPERATIVE DIAGNOSES:  L3-L4 spondylolisthesis with neurogenic claudication, radiculopathy.  Status post fusion L4-L5.  POSTOPERATIVE DIAGNOSES:  L3-L4 spondylolisthesis with neurogenic claudication, radiculopathy.  Status post fusion L4-L5.  PROCEDURES:  Removal of a spinal cord stimulator wires.  Bilateral L3 laminectomy and facetectomy.  Bilaterally L3-L4 diskectomy more than normal to introduce 2 expandable cages.  Pedicle screws at the level of L3, rod from L3 to a hook, both rods at the level of L4.  Posterolateral arthrodesis with the Vitoss and BMP.  Cell Saver.  C-arm.  SURGEON:  Leeroy Cha, M.D.  ASSISTANT:  Kary Kos, M.D.  CLINICAL HISTORY:  Ms. Koo is a lady who underwent L4-L5 fusion many years ago.  Lately, she had been having pain in radiation to both lower extremities.  She had failed with conservative treatment including appointment with the pain clinic.  X-ray showed that she has stenosis at the level of L3-L4 with spondylolisthesis.  Surgery was advised and she knew the risks and benefits.  DESCRIPTION OF PROCEDURE:  The patient was taken to the OR, and after intubation, she was positioned in a prone manner.  The back was cleaned with DuraPrep and Betadine.  Drapes were applied.  Midline incision following the previous one was made from L2-3 down to L4-L5.  Muscles were retracted all the way laterally until we were able to see the heads of the pedicle screws of L4.  To our surprise, we found some wires in the epidural space right at the level of L4-L5 and there was no battery connected.  We had not record of having a spinal cord stimulator  and slowly we had to remove the wire which were entangled with the head of the pedicle screws and the epidural space.  Having done this, we proceed with removal of the spinous process of L3 and the lamina.  We went laterally and removed the facet bilaterally.  Then, we retracted the thecal sac after we did the lysis of adhesion and we retracted first in the right side and then the left side and total gross diskectomy medial and laterally was done.  The diskectomy was more than normal, so we were able to introduce 2 expandable cages, which went expanded to 15 mm.  The cages had BMP and the rest of the disk space was filled up with a BMP. Then using the drill, we were able to remove the bone around the rod between L4-5 and made the space, so we were able to insert 2 hooks. Because of that, there was no need to remove the pedicles of L4.  Once the hooks were implanted with the C-arm first in AP view and lateral view, we drilled the pedicle of L3.  Prior to introducing the screws, we feel the holes in all 4 quadrants and to be sure that we were surrounded by bone.  Two  screws of 5.5 x 40 were inserted and they were attached to the hooks with a rod.  They were kept in place with caps.  AP and lateral showed good position of the cages and good position of the pedicle screws.  From then on, we went laterally and we removed the periosteum of L3-L4 and a mix of BMP and Vitoss were used for arthrodesis.  The area was irrigated.  Hemovac was left in the operative site and the wound was closed with Vicryl and a staple.          ______________________________ Leeroy Cha, M.D.     EB/MEDQ  D:  07/28/2014  T:  07/29/2014  Job:  [id number]

## 2014-07-29 NOTE — Progress Notes (Signed)
Patient refused both of her MDIs this AM. Stated that they made her cough too bad last night, and she is in pain. She also stated that she takes QVAR at home, and does not want to take Flovent. I explained the substitution, and she still would not take medication. RT to monitor as needed

## 2014-07-29 NOTE — Progress Notes (Signed)
PT Cancellation Note  Patient Details Name: Victoria Levine MRN: [id number] DOB: 07/04/1950   Cancelled Treatment:    Reason Eval/Treat Not Completed: Pain limiting ability to participate;Fatigue/lethargy limiting ability to participate.  Will try back another time.     Hamlin Devine, Thornton Papas 07/29/2014, 10:07 AM

## 2014-07-29 NOTE — Progress Notes (Signed)
Patient ID: Victoria Levine, female   DOB: 1950/07/19, 64 y.o.   MRN: [id number] Foley out. No weakness. Do not want morphine. See orders

## 2014-07-29 NOTE — Progress Notes (Signed)
Pt is complaining of nausea, unrelieved by PRN zofran. No vomitus episodes to note as of yet. On-call neurosurgery MD, Dr. Vertell Limber contacted and made aware. Verbal order given for phenergan 12.5-25 mg Q6 PRN. Will administer phenergan and try to complete med pass if relief is met. Will continue to monitor. Fortino Sic, RN, BSN 07/29/2014 9:44 PM

## 2014-07-29 NOTE — Progress Notes (Signed)
Patient is experiencing a great deal of anxiety with regard to movement she is also having a hard time following and remembering instructions. She is eager to progress but is not able to follow thru as she is very guarded and inflexible  anticipating pain.

## 2014-07-30 MED ORDER — FLEET ENEMA 7-19 GM/118ML RE ENEM: 1.0000 | ENEMA | Freq: Every day | RECTAL | Status: DC | PRN

## 2014-07-30 NOTE — Progress Notes (Signed)
Physical Therapy Treatment Patient Details Name: Victoria Levine MRN: [id number] DOB: [birth year] Today's Date: 07/30/2014    History of Present Illness 64 yo female s/p spinal cord stimulator Bil L3 laminectomy and facetectomy, pedicle screws at L3 posterolateral arthrodesis with teh vitoss and BMP PMH:  Bipolar, depression, herpes, migraines, back surg    PT Comments    Patient with more difficulty with mobility and cognition this session. Patient unable to perform mobility without moderate to maximal assist. Patient reports increased nausea, pain in back as well as sharp chest pain reported (nursing Latoya made aware). At this time, given events of last night (re: confusion) as well as poor mobility status and decreased caregiver assist (lives alone), feel that patient may be very high risk for falls and complications. Recommend updated discharge to SNF upon acute discharge.   Follow Up Recommendations  SNF (pt prefers HHPT, but do not feel this is safe option)     Equipment Recommendations  None recommended by PT    Recommendations for Other Services       Precautions / Restrictions Precautions Precautions: Back Precaution Comments: no recollection of need for brace or precuaitons, POOR COMPLIANCE Required Braces or Orthoses: Spinal Brace Spinal Brace: Lumbar corset;Applied in sitting position Restrictions Weight Bearing Restrictions: No    Mobility  Bed Mobility Overal bed mobility: Needs Assistance Bed Mobility: Rolling;Sidelying to Sit Rolling: Mod assist Sidelying to sit: Max assist Supine to sit: Mod assist     General bed mobility comments: Patient requiring significant increase in physical assist this session copared to previous session. Patient complaining of increased pain and demonstrates poor awareness.  Transfers Overall transfer level: Needs assistance Equipment used: Rolling walker (2 wheeled) Transfers: Sit to/from [facility] Sit  to Stand: Max assist Stand pivot transfers: Max assist       General transfer comment: Patient with bilateral LE buckling upon attempts to come to standing. Increased physical assist required this session. reliance on gait belt and wrap around support.   Ambulation/Gait             General Gait Details: unsafe to perform at this time.   Stairs            Wheelchair Mobility    Modified Rankin (Stroke Patients Only)       Balance             Standing balance-Leahy Scale: Poor Standing balance comment: assist required for hygiene and safety, heavy reliance on rolling walker and support via gait belt                    Cognition Arousal/Alertness: Awake/alert Behavior During Therapy: Impulsive;Restless;Anxious Overall Cognitive Status: No family/caregiver present to determine baseline cognitive functioning Area of Impairment: Following commands;Safety/judgement       Following Commands: Follows multi-step commands inconsistently Safety/Judgement: Decreased awareness of safety     General Comments: Patient with poor carryover and understanding of cues, patient tangential in convernsation     Exercises      General Comments General comments (skin integrity, edema, etc.): Patient remains restless and impulsive.      Pertinent Vitals/Pain Pain Assessment: 0-10 Pain Score: 8  Pain Location: back and chest Pain Descriptors / Indicators: Aching;Constant;[facility]    Home Living                      Prior Function            PT Goals (current  goals can now be found in the care plan section) Acute Rehab PT Goals Patient Stated Goal: to go to the bathroom PT Goal Formulation: With patient Time For Goal Achievement: 08/12/14 Potential to Achieve Goals: Good Progress towards PT goals: Not progressing toward goals - comment    Frequency  Min 5X/week    PT Plan Discharge plan needs to be updated    Co-evaluation              End of Session Equipment Utilized During Treatment: Gait belt;Back brace Activity Tolerance: Treatment limited secondary to agitation;Patient limited by pain Patient left: in bed;with bed alarm set;with call bell/phone within reach, floor mat in place     Time: 2297-9892 PT Time Calculation (min) (ACUTE ONLY): 19 min  Charges:  $Therapeutic Activity: 8-22 mins                    G CodesDuncan Dull 08/23/2014, 8:04 AM Alben Deeds, PT DPT  [phone]

## 2014-07-30 NOTE — Progress Notes (Addendum)
Went to check on patient during hourly rounds; patient had pulled out iv site and had wiggled all the way to the end of the bed.  When I asked the patient what happened she said, "that mean horse came and kicked."  I asked the patient where the horse was and she said, "overthere in the [place] cabinet, its been starring at me for days." I asked what sound does the horse make and she said, "Nah."  Called iv team to restart after patient was settled back down.  Patient was able to answer her name, birthdate, but no other orientation questions.  Patient asked for more pain medication; tried environmental changes until patient is less confused.

## 2014-07-30 NOTE — Clinical Social Work Note (Signed)
Clinical Social Work Assessment  Patient Details  Name: Victoria Levine MRN: [id number] Date of Birth: [birth year]  Date of referral:  07/30/14               Reason for consult:  Facility Placement                Permission sought to share information with:  Case Manager, Customer service manager, Family Supports Permission granted to share information::   YES  Name::      Ebbie Ridge   Agency::   N/A   Relationship::   Daughter  Contact Information:   [phone]  Housing/Transportation Living arrangements for the past 2 months:  [place] of Information:  Patient Patient Interpreter Needed:  None Criminal Activity/Legal Involvement Pertinent to Current Situation/Hospitalization:  No - Comment as needed Significant Relationships:  Adult Children Lives with:   Alone  Do you feel safe going back to the place where you live?  Yes Need for family participation in patient care:  Yes (Comment)  Care giving concerns:  No care giving concerns reported at this time.    Social Worker assessment / plan:  Holiday representative met with patient in reference to post-acute placement for SNF. CSW introduced CSW role and SNF process. CSW also reviewed and provided SNF list. Pt reported she is from home alone with no family in the area. Pt stated she lives in a retirement community indicating her neighbors are elderly. Pt reported she does not have family support however follows this statement with stating she does not want to be a burden to her friends and family. Pt reported she is currently not interested in SNF placement and would like to return home with Home Health services. CSW explained Home Health services would not provide 24 hour assistance and shared PT recommendations for SNF placement again. Pt asked for CSW thoughts and CSW recommended pt consider SNF placement for a safe discharge plan. Pt then reported she has babies (kittens) at home and she does not have  anyone to care for them while she is away. Pt does not want to be worrisome to friends and family. CSW encouraged pt to contact family and friends to assist with kittens while she is at [facility]. Pt currently declining SNF placement and asked if CSW could follow up over the weekend/ Monday stating she is in pain at the moment. CSW left SNF list at bedside.   No further questions/concerns reported. CSW will continue to follow pt for continued support and follow up with pt at a later date.   Employment status:  Retired Nurse, adult PT Recommendations:  [place] / Referral to community resources:  Home  Patient/Family's Response to care:  Pt lying in bed with her eyes closed stating she is in pain. Pt currently not able to SNF placement and asked is CSW could come back. Pt pleasant and appreciated social work intervention.    Patient/Family's Understanding of and Emotional Response to Diagnosis, Current Treatment, and Prognosis: Pt reported she is in pain, and understands surgical procedure and current treatment.     Emotional Assessment Appearance:  Appears stated age Attitude/Demeanor/Rapport:   (Pleasant ) Affect (typically observed):  Apprehensive, Appropriate, Overwhelmed Orientation:  Oriented to Self, Oriented to Place, Oriented to  Time, Oriented to Situation Alcohol / Substance use:  Never Used Psych involvement (Current and /or in the community):  No (Comment)  Discharge Needs  Concerns  to be addressed:  Basic Needs, Home Safety Concerns Readmission within the last 30 days:  No Current discharge risk:  Dependent with Mobility Barriers to Discharge:  No Barriers Identified   Glendon Axe, MSW, LCSWA [phone] 07/30/2014 12:02 PM

## 2014-07-30 NOTE — Progress Notes (Signed)
Patient ID: Victoria Levine, female   DOB: 09/23/1950, 64 y.o.   MRN: [id number] C/o incisional pain. Voiding well. No weakness

## 2014-07-30 NOTE — Progress Notes (Signed)
Pt oriented x4 to orientation questions but confused.  C/o pain 8/10 (back) and nausea, medication given.  No c/o chest pain when asked.  Will continue to monitor. Cori Razor, RN

## 2014-07-30 NOTE — Progress Notes (Signed)
Occupational Therapy Treatment Patient Details Name: Victoria Levine MRN: [id number] DOB: 1951-01-08 Today's Date: 07/30/2014    History of present illness 64 yo female s/p spinal cord stimulator Bil L3 laminectomy and facetectomy, pedicle screws at L3 posterolateral arthrodesis with teh vitoss and BMP PMH:  Bipolar, depression, herpes, migraines, back surg   OT comments  Pt with cognitive deficits and fall risk. Recommending snf due to decr safety and not family present to determine PLOF.   Follow Up Recommendations  SNF    Equipment Recommendations  Other (comment) (defer to snf)    Recommendations for Other Services      Precautions / Restrictions Precautions Precautions: Back Precaution Comments: no recall of precautions and lack of awareness to deficits. Pt reports "i did something yesterday to my back" Required Braces or Orthoses: Spinal Brace Spinal Brace: Lumbar corset;Applied in sitting position       Mobility Bed Mobility Overal bed mobility: Needs Assistance Bed Mobility: Supine to Sit     Supine to sit: Mod assist     General bed mobility comments: HOb 23 degrees and needed max cues to sequence  Transfers Overall transfer level: Needs assistance Equipment used: Rolling walker (2 wheeled) Transfers: Sit to/from Stand Sit to Stand: Min assist         General transfer comment: pt completed transfer with mod v/c. Pt max (A) to don brace    Balance                                   ADL Overall ADL's : Needs assistance/impaired   Eating/Feeding Details (indicate cue type and reason): declined lunch but then reaches for lunch then declines lunch Grooming: Wash/dry hands;Moderate assistance;Standing Grooming Details (indicate cue type and reason): needed max encouragement to wash hand. Pt sticky R hand under water and then grabbing towel                 Toilet Transfer: Ambulation;Minimal assistance;BSC;RW Toilet Transfer  Details (indicate cue type and reason): cues for safety and posture Toileting- Clothing Manipulation and Hygiene: Min guard;Sit to/from stand Toileting - Clothing Manipulation Details (indicate cue type and reason): wiping in seated position     Functional mobility during ADLs: Moderate assistance;Rolling walker General ADL Comments: pt impulsive and inconsistent throughout session      Vision                     Perception     Praxis      Cognition   Behavior During Therapy: Impulsive Overall Cognitive Status: Impaired/Different from baseline Area of Impairment: Following commands;Safety/judgement;Awareness;Problem solving;Memory;Attention   Current Attention Level: Sustained Memory: Decreased short-term memory;Decreased recall of precautions  Following Commands: Follows one step commands inconsistently;Follows one step commands with increased time Safety/Judgement: Decreased awareness of safety;Decreased awareness of deficits Awareness: Emergent Problem Solving: Slow processing;Decreased initiation;Difficulty sequencing General Comments: Pt requesting to go to the bathroom because she feels the pain she is experiencing is because she needs to have a bowel movement Pt sitting on eob and states "what are we doing? where are we going?" OT again reinforces patients request by repeating back "do you still have to have a bowel movement?" Pt states "oh yeah yeah thats right" Pt requesting phone when phone is eye level on tray with patient looking at it. Pt requesting pink basin that is present in line of sight    Extremity/Trunk Assessment  Exercises     Shoulder Instructions       General Comments      Pertinent Vitals/ Pain       Pain Assessment: Faces Pain Score: 10-Worst pain ever Pain Location: abdominal pain and L upper quadrant area. Pt denies SOB pt Denies arm pain. pt  (pt offers that it could be inability to go to void bowel) Pain  Descriptors / Indicators: Constant Pain Intervention(s): Monitored during session;Repositioned;Premedicated before session  Home Living                                          Prior Functioning/Environment              Frequency Min 2X/week     Progress Toward Goals  OT Goals(current goals can now be found in the care plan section)  Progress towards OT goals: Progressing toward goals  Acute Rehab OT Goals Patient Stated Goal: to go to the bathroom OT Goal Formulation: With patient Time For Goal Achievement: 08/12/14 Potential to Achieve Goals: Good ADL Goals Pt Will Perform Lower Body Bathing: with supervision;sit to/from stand;with adaptive equipment Pt Will Transfer to Toilet: with supervision;bedside commode Pt Will Perform Tub/Shower Transfer: Tub transfer;rolling walker;ambulating Additional ADL Goal #1: pt will verbalize back precautions 3 out 3 as precursor to adls  Plan Discharge plan remains appropriate    Co-evaluation                 End of Session Equipment Utilized During Treatment: Gait belt;Rolling walker;Back brace   Activity Tolerance Patient tolerated treatment well   Patient Left in bed;with call bell/phone within reach;with bed alarm set   Nurse Communication Mobility status;Precautions        Time: 0981-1914 OT Time Calculation (min): 15 min  Charges: OT General Charges $OT Visit: 1 Procedure OT Treatments $Self Care/Home Management : 8-22 mins  Parke Poisson B 07/30/2014, 3:03 PM  Pager: [phone]

## 2014-07-30 NOTE — Clinical Social Work Placement (Signed)
   CLINICAL SOCIAL WORK PLACEMENT  NOTE  Date:  07/30/2014  Patient Details  Name: VERONDA GABOR MRN: [id number] Date of Birth: 1951/01/18  Clinical Social Work is seeking post-discharge placement for this patient at the [place] level of care (*CSW will initial, date and re-position this form in  chart as items are completed):  Yes   Patient/family provided with [place] Work Department's list of facilities offering this level of care within the geographic area requested by the patient (or if unable, by the patient's family).  Yes   Patient/family informed of their freedom to choose among providers that offer the needed level of care, that participate in Medicare, Medicaid or managed care program needed by the patient, have an available bed and are willing to accept the patient.  Yes   Patient/family informed of [facility]'s ownership interest in [facility] and [facility], as well as of the fact that they are under no obligation to receive care at these facilities.  PASRR submitted to EDS on       PASRR number received on       Existing PASRR number confirmed on       FL2 transmitted to all facilities in geographic area requested by pt/family on       FL2 transmitted to all facilities within larger geographic area on       Patient informed that his/her managed care company has contracts with or will negotiate with certain facilities, including the following:            Patient/family informed of bed offers received.  Patient chooses bed at       Physician recommends and patient chooses bed at      Patient to be transferred to   on  .  Patient to be transferred to facility by       Patient family notified on   of transfer.  Name of family member notified:        PHYSICIAN       Additional Comment:    _______________________________________________ Rozell Searing, LCSW 07/30/2014, 12:06 PM

## 2014-07-31 NOTE — Social Work (Signed)
CSW met with patient in order to reassess if she would be willing to go to a SNF. Patient stated that after speaking with her friends she thinks it will be a good idea due to her deconditioning. CSW completed FL2 and faxed patient to facilities in [facility]. Patient stated that she would like to go to [facility] choice, but would prefer anything in '[id number]' part of [place]. Patient stated that she was okay with being faxed out to [facility].  CSW  Submitted PASSR requirest, but no PASSR received as of 4:20pm on 07/31/14. Patient will likely be medically ready for discharge on 08/01/14.   CSW will continue to follow.  Christene Lye MSW, [place]

## 2014-07-31 NOTE — Progress Notes (Signed)
Physical Therapy Treatment Patient Details Name: Victoria Levine MRN: [id number] DOB: [birth year] Today's Date: 07/31/2014    History of Present Illness 64 yo female s/p spinal cord stimulator Bil L3 laminectomy and facetectomy, pedicle screws at L3 posterolateral arthrodesis with teh vitoss and BMP PMH:  Bipolar, depression, herpes, migraines, back surg    PT Comments    Progressing towards physical therapy goals. Decreased recall of back precautions and use of brace which were reviewed with patient. Ambulatory distance greatly improved, still requiring min assist for occasional loss of balance with poor coordination of feet demonstrating a scissoring type gait pattern. Tolerated therapeutic exercises well. Patient will continue to benefit from skilled physical therapy services to further improve independence with functional mobility.   Follow Up Recommendations  SNF     Equipment Recommendations  None recommended by PT    Recommendations for Other Services       Precautions / Restrictions Precautions Precautions: Back Precaution Comments: Recalls 1/3 precautions. Reviewed Required Braces or Orthoses: Spinal Brace Spinal Brace: Lumbar corset;Applied in sitting position Restrictions Weight Bearing Restrictions: No    Mobility  Bed Mobility Overal bed mobility: Needs Assistance Bed Mobility: Rolling;Sidelying to Sit Rolling: Min guard Sidelying to sit: Min assist       General bed mobility comments: Cues for log roll technique with instructions for sequencing. min assist for truncal support to sit edge of bed.  Transfers Overall transfer level: Needs assistance Equipment used: Rolling walker (2 wheeled) Transfers: Sit to/from Stand Sit to Stand: Min assist         General transfer comment: Min assist for boost and balance. Cues for hand placement. Stabilizes when holding RW for support. Need assist to apply brace EOB.  Ambulation/Gait Ambulation/Gait  assistance: Min assist Ambulation Distance (Feet): 105 Feet Assistive device: Rolling walker (2 wheeled) Gait Pattern/deviations: Step-through pattern;Decreased stride length;Scissoring;Staggering left;Staggering right;Narrow base of support;Trunk flexed Gait velocity: decreased   General Gait Details: Mild staggering at times demonstrating scissoring of gait requiring min assist for balance. Educated on safe DME use with a rolling walker. No buckling of LEs noted. Cues for upright posture with forward gaze and awareness of foot placement.   Stairs            Wheelchair Mobility    Modified Rankin (Stroke Patients Only)       Balance                                    Cognition Arousal/Alertness: Awake/alert Behavior During Therapy: Restless Overall Cognitive Status: No family/caregiver present to determine baseline cognitive functioning Area of Impairment: Following commands;Safety/judgement     Memory: Decreased recall of precautions Following Commands: Follows multi-step commands inconsistently Safety/Judgement: Decreased awareness of safety          Exercises General Exercises - Lower Extremity Ankle Circles/Pumps: AROM;Both;10 reps;Seated Long Arc Quad: Strengthening;Both;10 reps;Seated Hip Flexion/Marching: Strengthening;Both;10 reps;Seated    General Comments General comments (skin integrity, edema, etc.): Pt discussed her desire to stop taking pain medication as she feel she has been addicted for quite some time.      Pertinent Vitals/Pain Pain Assessment: 0-10 Pain Score:  ("I'm okay right now" no value given) Pain Location: back Pain Intervention(s): Monitored during session;Repositioned    Home Living                      Prior Function  PT Goals (current goals can now be found in the care plan section) Acute Rehab PT Goals PT Goal Formulation: With patient Time For Goal Achievement: 08/12/14 Potential to  Achieve Goals: Good Progress towards PT goals: Progressing toward goals    Frequency  Min 5X/week    PT Plan Current plan remains appropriate    Co-evaluation             End of Session Equipment Utilized During Treatment: Gait belt;Back brace Activity Tolerance: Patient tolerated treatment well Patient left: with call bell/phone within reach;in chair;with chair alarm set     Time: 1538-1601 PT Time Calculation (min) (ACUTE ONLY): 23 min  Charges:  $Gait Training: 8-22 mins $Therapeutic Activity: 8-22 mins                    G Codes:      Ellouise Newer 2014/08/17, 4:18 PM Camille Bal [place], [place]

## 2014-07-31 NOTE — Progress Notes (Signed)
Patient ID: Victoria Levine, female   DOB: 1950-05-29, 64 y.o.   MRN: [id number] Much better, less pain. No weakness. Waiting for SNF

## 2014-08-01 NOTE — Progress Notes (Signed)
CSW intern provided bed offers to pt. Pt would like to d/c to [facility]. Pt. Asked CSW Intern if she can go home before going into the facility to pay bills and take care of financial concerns. CSW Intern advised to consult with doctor for feedback.

## 2014-08-01 NOTE — Progress Notes (Signed)
Patient ID: Victoria Levine, female   DOB: 1951/01/05, 64 y.o.   MRN: [id number] Better, ambulating. Wound dry placement?

## 2014-08-02 DIAGNOSIS — R2689 Other abnormalities of gait and mobility: Secondary | ICD-10-CM | POA: Diagnosis not present

## 2014-08-02 DIAGNOSIS — Z4789 Encounter for other orthopedic aftercare: Secondary | ICD-10-CM | POA: Diagnosis not present

## 2014-08-02 DIAGNOSIS — R278 Other lack of coordination: Secondary | ICD-10-CM | POA: Diagnosis not present

## 2014-08-02 DIAGNOSIS — M6281 Muscle weakness (generalized): Secondary | ICD-10-CM | POA: Diagnosis not present

## 2014-08-02 DIAGNOSIS — M4326 Fusion of spine, lumbar region: Secondary | ICD-10-CM | POA: Diagnosis not present

## 2014-08-02 NOTE — Progress Notes (Signed)
Physical Therapy Treatment Patient Details Name: Victoria Levine MRN: [id number] DOB: 1950/03/26 Today's Date: 08/02/2014    History of Present Illness 64 yo female s/p spinal cord stimulator Bil L3 laminectomy and facetectomy, pedicle screws at L3 posterolateral arthrodesis with teh vitoss and BMP PMH:  Bipolar, depression, herpes, migraines, back surg    PT Comments    Patient continues to require cues for safety and precautions. Patient impulsive at times and not processing safe techniques with use of RW. Continue to recommend SNF for ongoing Physical Therapy as patient with need to increase functional independence prior to returning home.     Follow Up Recommendations  SNF     Equipment Recommendations  None recommended by PT    Recommendations for Other Services       Precautions / Restrictions Precautions Precautions: Back Precaution Comments: Patient able to recall all precaution but needed reminders with mobility Required Braces or Orthoses: Spinal Brace Spinal Brace: Lumbar corset;Applied in sitting position    Mobility  Bed Mobility Overal bed mobility: Needs Assistance Bed Mobility: Supine to Sit Rolling: Min guard Sidelying to sit: Min assist Supine to sit: Min guard     General bed mobility comments: cues for safety adn back precautions. Pt informed to avoid twisting  Transfers Overall transfer level: Needs assistance Equipment used: Rolling walker (2 wheeled) Transfers: Sit to/from Stand Sit to Stand: Min guard         General transfer comment: cues for safety and back precautions  Ambulation/Gait Ambulation/Gait assistance: Min guard Ambulation Distance (Feet): 120 Feet Assistive device: Rolling walker (2 wheeled)   Gait velocity: increasing but to the point of impulsiveness   General Gait Details: Slight staggering noted with decreased dorsiflexion at times causing her to stumble some. Cues for safe turns with use of RW and positioning of  RW   Stairs            Wheelchair Mobility    Modified Rankin (Stroke Patients Only)       Balance                                    Cognition Arousal/Alertness: Awake/alert Behavior During Therapy: Impulsive Overall Cognitive Status: Impaired/Different from baseline Area of Impairment: Following commands;Memory;Safety/judgement;Awareness   Current Attention Level: Sustained Memory: Decreased recall of precautions Following Commands: Follows one step commands with increased time;Follows multi-step commands inconsistently Safety/Judgement: Decreased awareness of safety Awareness: Emergent Problem Solving: Slow processing;Decreased initiation;Difficulty sequencing General Comments: Cues for safety as patient impulsive and quick with movements not taking precautions into account    Exercises      General Comments        Pertinent Vitals/Pain Pain Assessment: 0-10 Faces Pain Scale: Hurts even more Pain Location: back pain Pain Descriptors / Indicators: Constant Pain Intervention(s): Monitored during session;Repositioned    Home Living                      Prior Function            PT Goals (current goals can now be found in the care plan section) Acute Rehab PT Goals Patient Stated Goal: to go to the bathroom Progress towards PT goals: Progressing toward goals    Frequency  Min 5X/week    PT Plan Current plan remains appropriate    Co-evaluation             End  of Session Equipment Utilized During Treatment: Gait belt;Back brace Activity Tolerance: Patient tolerated treatment well Patient left: in bed;with call bell/phone within reach     Time: 1050-1101 PT Time Calculation (min) (ACUTE ONLY): 11 min  Charges:  $Gait Training: 8-22 mins                    G Codes:      Oviya, Ammar 08/02/2014, 11:15 AM  08/02/2014 Jacqualyn Posey PTA [phone] pager [phone] office

## 2014-08-02 NOTE — Clinical Social Work Note (Signed)
Clinical Social Worker facilitated patient discharge including contacting patient family and facility to confirm patient discharge plans.  Clinical information faxed to facility and family agreeable with plan.  Patient notified CSW that her ex-husband will be transporting her to facility, [facility]. RN to call report prior to discharge.  DC packet prepared and on chart for transport.   Clinical Social Worker will sign off for now as social work intervention is no longer needed. Please consult [place] again if new need arises.  Glendon Axe, MSW, LCSWA [phone] 08/02/2014 2:30 PM

## 2014-08-02 NOTE — Progress Notes (Signed)
Report given to Antoinette from [facility] given to patient. All questions answered at this time.   Ave Filter, RN

## 2014-08-02 NOTE — Discharge Summary (Signed)
Physician Discharge Summary  Patient ID: Victoria Levine MRN: [id number] DOB/AGE: 1950-06-01 64 y.o.  Admit date: 07/28/2014 Discharge date: 08/02/2014  Admission Diagnoses:lumbar spondylolisthesis  Discharge Diagnoses:  Active Problems:   Spondylolisthesis of lumbar region   Discharged Condition: no weaknes  Hospital Course: surgery  Consults: none  Significant Diagnostic Studies:myelogram  Treatments: decompression and fusion lumbar spine  Discharge Exam: Blood pressure 116/57, pulse 76, temperature 98.5 F (36.9 C), temperature source Oral, resp. rate 20, height 5\' 4"  (1.626 m), weight 74.844 kg (165 lb), SpO2 97 %. No weakness  Disposition: SNF. Please remove staples in 1 week     Medication List    ASK your doctor about these medications        acyclovir 400 MG tablet  Commonly known as:  ZOVIRAX  Take 400 mg by mouth every 4 (four) hours as needed (outbreak).     acyclovir ointment 5 %  Commonly known as:  ZOVIRAX  APPLY TO AFFECTED AREA EVERY 3 HOURS     albuterol (2.5 MG/3ML) 0.083% nebulizer solution  Commonly known as:  PROVENTIL  Take 2.5 mg by nebulization as needed for wheezing or shortness of breath.     ALPRAZolam 0.5 MG tablet  Commonly known as:  XANAX  Take 0.5 mg by mouth 2 (two) times daily as needed for anxiety or sleep.     ASTEPRO NA  Place 1 spray into the nose daily.     beclomethasone 80 MCG/ACT inhaler  Commonly known as:  QVAR  Inhale 1 puff into the lungs as needed (sob).     buPROPion 300 MG 24 hr tablet  Commonly known as:  WELLBUTRIN XL  Take 300 mg by mouth daily.     CALCIUM + D PO  Take 1 tablet by mouth daily.     FLUoxetine 40 MG capsule  Commonly known as:  PROZAC  Take 40 mg by mouth every morning.     gabapentin 100 MG capsule  Commonly known as:  NEURONTIN  Take 100 mg by mouth 2 (two) times daily.     levothyroxine 125 MCG tablet  Commonly known as:  SYNTHROID  Take 1 tablet (125 mcg total) by  mouth daily before breakfast.     methocarbamol 500 MG tablet  Commonly known as:  ROBAXIN  Take 500 mg by mouth 3 (three) times daily.     multivitamin tablet  Take 1 tablet by mouth daily.     oxyCODONE 15 MG Tb12  Commonly known as:  OXYCONTIN  Take 15 mg by mouth 4 (four) times daily as needed (pain).     pantoprazole 40 MG tablet  Commonly known as:  PROTONIX  Take 40 mg by mouth daily.     QUEtiapine 50 MG tablet  Commonly known as:  SEROQUEL  Take 50-100 mg by mouth at bedtime.     ranitidine 300 MG tablet  Commonly known as:  ZANTAC  Take 300 mg by mouth every evening.     tiotropium 18 MCG inhalation capsule  Commonly known as:  SPIRIVA  Place 18 mcg into inhaler and inhale daily.     TRILEPTAL PO  Take 600 mg by mouth 2 (two) times daily.     valACYclovir 500 MG tablet  Commonly known as:  VALTREX  Daily for suppression or twice daily for 3-5 days as needed.         Signed: Floyce Stakes 08/02/2014, 9:36 AM

## 2014-08-02 NOTE — Clinical Social Work Placement (Signed)
   CLINICAL SOCIAL WORK PLACEMENT  NOTE  Date:  08/02/2014  Patient Details  Name: Victoria Levine MRN: [id number] Date of Birth: 31-Jan-1951  Clinical Social Work is seeking post-discharge placement for this patient at the [place] level of care (*CSW will initial, date and re-position this form in  chart as items are completed):  Yes   Patient/family provided with [facility]'s list of facilities offering this level of care within the geographic area requested by the patient (or if unable, by the patient's family).  Yes   Patient/family informed of their freedom to choose among providers that offer the needed level of care, that participate in Medicare, Medicaid or managed care program needed by the patient, have an available bed and are willing to accept the patient.  Yes   Patient/family informed of [facility]'s ownership interest in [facility] and [facility], as well as of the fact that they are under no obligation to receive care at these facilities.  PASRR submitted to EDS on 08/02/14     PASRR number received on 08/02/14     Existing PASRR number confirmed on       FL2 transmitted to all facilities in geographic area requested by pt/family on 08/02/14     FL2 transmitted to all facilities within larger geographic area on       Patient informed that his/her managed care company has contracts with or will negotiate with certain facilities, including the following:   (YES)     Yes   Patient/family informed of bed offers received.  Patient chooses bed at  [facility] and [facility] )     Physician recommends and patient chooses bed at      Patient to be transferred to  ([facility] ) on 08/02/14.  Patient to be transferred to facility by  [facility])     Patient family notified on 08/02/14 of transfer.  Name of family member notified:   (Pt reported she would notify family and  friends. )     PHYSICIAN       Additional Comment:    _______________________________________________ Rozell Searing, LCSW 08/02/2014, 10:53 AM
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
## 2014-08-02 NOTE — Telephone Encounter (Signed)
Phone call follow up Prolia , no answer . Unable to leave message due to [facility]. Will call pt back.

## 2014-08-08 DIAGNOSIS — R262 Difficulty in walking, not elsewhere classified: Secondary | ICD-10-CM | POA: Diagnosis not present

## 2014-08-08 DIAGNOSIS — M810 Age-related osteoporosis without current pathological fracture: Secondary | ICD-10-CM | POA: Diagnosis not present

## 2014-08-08 DIAGNOSIS — Z4889 Encounter for other specified surgical aftercare: Secondary | ICD-10-CM | POA: Diagnosis not present

## 2014-08-08 DIAGNOSIS — M4316 Spondylolisthesis, lumbar region: Secondary | ICD-10-CM | POA: Diagnosis not present

## 2014-08-08 DIAGNOSIS — G959 Disease of spinal cord, unspecified: Secondary | ICD-10-CM | POA: Diagnosis not present

## 2014-08-09 DIAGNOSIS — M4316 Spondylolisthesis, lumbar region: Secondary | ICD-10-CM | POA: Diagnosis not present

## 2014-08-09 DIAGNOSIS — Z4889 Encounter for other specified surgical aftercare: Secondary | ICD-10-CM | POA: Diagnosis not present

## 2014-08-09 DIAGNOSIS — R262 Difficulty in walking, not elsewhere classified: Secondary | ICD-10-CM | POA: Diagnosis not present

## 2014-08-09 DIAGNOSIS — M810 Age-related osteoporosis without current pathological fracture: Secondary | ICD-10-CM | POA: Diagnosis not present

## 2014-08-09 DIAGNOSIS — G959 Disease of spinal cord, unspecified: Secondary | ICD-10-CM | POA: Diagnosis not present

## 2014-08-12 DIAGNOSIS — Z4889 Encounter for other specified surgical aftercare: Secondary | ICD-10-CM | POA: Diagnosis not present

## 2014-08-12 DIAGNOSIS — M4316 Spondylolisthesis, lumbar region: Secondary | ICD-10-CM | POA: Diagnosis not present

## 2014-08-12 DIAGNOSIS — G959 Disease of spinal cord, unspecified: Secondary | ICD-10-CM | POA: Diagnosis not present

## 2014-08-12 DIAGNOSIS — M810 Age-related osteoporosis without current pathological fracture: Secondary | ICD-10-CM | POA: Diagnosis not present

## 2014-08-12 DIAGNOSIS — R262 Difficulty in walking, not elsewhere classified: Secondary | ICD-10-CM | POA: Diagnosis not present

## 2014-08-16 DIAGNOSIS — R262 Difficulty in walking, not elsewhere classified: Secondary | ICD-10-CM | POA: Diagnosis not present

## 2014-08-16 DIAGNOSIS — M4316 Spondylolisthesis, lumbar region: Secondary | ICD-10-CM | POA: Diagnosis not present

## 2014-08-16 DIAGNOSIS — G959 Disease of spinal cord, unspecified: Secondary | ICD-10-CM | POA: Diagnosis not present

## 2014-08-16 DIAGNOSIS — M810 Age-related osteoporosis without current pathological fracture: Secondary | ICD-10-CM | POA: Diagnosis not present

## 2014-08-16 DIAGNOSIS — Z4889 Encounter for other specified surgical aftercare: Secondary | ICD-10-CM | POA: Diagnosis not present

## 2014-08-17 DIAGNOSIS — Z4889 Encounter for other specified surgical aftercare: Secondary | ICD-10-CM | POA: Diagnosis not present

## 2014-08-17 DIAGNOSIS — R262 Difficulty in walking, not elsewhere classified: Secondary | ICD-10-CM | POA: Diagnosis not present

## 2014-08-17 DIAGNOSIS — M810 Age-related osteoporosis without current pathological fracture: Secondary | ICD-10-CM | POA: Diagnosis not present

## 2014-08-17 DIAGNOSIS — M4316 Spondylolisthesis, lumbar region: Secondary | ICD-10-CM | POA: Diagnosis not present

## 2014-08-17 DIAGNOSIS — G959 Disease of spinal cord, unspecified: Secondary | ICD-10-CM | POA: Diagnosis not present

## 2014-08-19 DIAGNOSIS — Z4889 Encounter for other specified surgical aftercare: Secondary | ICD-10-CM | POA: Diagnosis not present

## 2014-08-19 DIAGNOSIS — M810 Age-related osteoporosis without current pathological fracture: Secondary | ICD-10-CM | POA: Diagnosis not present

## 2014-08-19 DIAGNOSIS — G959 Disease of spinal cord, unspecified: Secondary | ICD-10-CM | POA: Diagnosis not present

## 2014-08-19 DIAGNOSIS — R262 Difficulty in walking, not elsewhere classified: Secondary | ICD-10-CM | POA: Diagnosis not present

## 2014-08-19 DIAGNOSIS — M4316 Spondylolisthesis, lumbar region: Secondary | ICD-10-CM | POA: Diagnosis not present

## 2014-08-23 DIAGNOSIS — R262 Difficulty in walking, not elsewhere classified: Secondary | ICD-10-CM | POA: Diagnosis not present

## 2014-08-23 DIAGNOSIS — G959 Disease of spinal cord, unspecified: Secondary | ICD-10-CM | POA: Diagnosis not present

## 2014-08-23 DIAGNOSIS — M810 Age-related osteoporosis without current pathological fracture: Secondary | ICD-10-CM | POA: Diagnosis not present

## 2014-08-23 DIAGNOSIS — Z4889 Encounter for other specified surgical aftercare: Secondary | ICD-10-CM | POA: Diagnosis not present

## 2014-08-23 DIAGNOSIS — M4316 Spondylolisthesis, lumbar region: Secondary | ICD-10-CM | POA: Diagnosis not present

## 2014-08-24 DIAGNOSIS — F441 Dissociative fugue: Secondary | ICD-10-CM | POA: Diagnosis not present

## 2014-08-24 DIAGNOSIS — F319 Bipolar disorder, unspecified: Secondary | ICD-10-CM | POA: Diagnosis not present

## 2014-08-24 DIAGNOSIS — F4310 Post-traumatic stress disorder, unspecified: Secondary | ICD-10-CM | POA: Diagnosis not present

## 2014-08-24 NOTE — Telephone Encounter (Signed)
Patient had Lumbar spine fusion 07/28/14. Patient will inform us when she can proceed with Prolia injection.

## 2014-08-26 DIAGNOSIS — R262 Difficulty in walking, not elsewhere classified: Secondary | ICD-10-CM | POA: Diagnosis not present

## 2014-08-26 DIAGNOSIS — Z4889 Encounter for other specified surgical aftercare: Secondary | ICD-10-CM | POA: Diagnosis not present

## 2014-08-26 DIAGNOSIS — G959 Disease of spinal cord, unspecified: Secondary | ICD-10-CM | POA: Diagnosis not present

## 2014-08-26 DIAGNOSIS — M4316 Spondylolisthesis, lumbar region: Secondary | ICD-10-CM | POA: Diagnosis not present

## 2014-08-26 DIAGNOSIS — M810 Age-related osteoporosis without current pathological fracture: Secondary | ICD-10-CM | POA: Diagnosis not present

## 2014-08-30 DIAGNOSIS — R262 Difficulty in walking, not elsewhere classified: Secondary | ICD-10-CM | POA: Diagnosis not present

## 2014-08-30 DIAGNOSIS — Z4889 Encounter for other specified surgical aftercare: Secondary | ICD-10-CM | POA: Diagnosis not present

## 2014-08-30 DIAGNOSIS — G959 Disease of spinal cord, unspecified: Secondary | ICD-10-CM | POA: Diagnosis not present

## 2014-08-30 DIAGNOSIS — M810 Age-related osteoporosis without current pathological fracture: Secondary | ICD-10-CM | POA: Diagnosis not present

## 2014-08-30 DIAGNOSIS — M4316 Spondylolisthesis, lumbar region: Secondary | ICD-10-CM | POA: Diagnosis not present

## 2014-09-01 DIAGNOSIS — Z4889 Encounter for other specified surgical aftercare: Secondary | ICD-10-CM | POA: Diagnosis not present

## 2014-09-01 DIAGNOSIS — R262 Difficulty in walking, not elsewhere classified: Secondary | ICD-10-CM | POA: Diagnosis not present

## 2014-09-01 DIAGNOSIS — M4316 Spondylolisthesis, lumbar region: Secondary | ICD-10-CM | POA: Diagnosis not present

## 2014-09-01 DIAGNOSIS — G959 Disease of spinal cord, unspecified: Secondary | ICD-10-CM | POA: Diagnosis not present

## 2014-09-01 DIAGNOSIS — M810 Age-related osteoporosis without current pathological fracture: Secondary | ICD-10-CM | POA: Diagnosis not present

## 2014-09-21 NOTE — Telephone Encounter (Signed)
Talked with patient regarding Prolia injection is due 06/2014, She had her spinal surgery and will go to surgeon this week for check up. She will inquire about her Prolia and will give me a call about the decision.

## 2014-09-23 DIAGNOSIS — R030 Elevated blood-pressure reading, without diagnosis of hypertension: Secondary | ICD-10-CM | POA: Diagnosis not present

## 2014-09-23 DIAGNOSIS — Z6826 Body mass index (BMI) 26.0-26.9, adult: Secondary | ICD-10-CM | POA: Diagnosis not present

## 2014-09-23 DIAGNOSIS — M4316 Spondylolisthesis, lumbar region: Secondary | ICD-10-CM | POA: Diagnosis not present

## 2014-10-09 ENCOUNTER — Encounter (HOSPITAL_COMMUNITY): Payer: Self-pay | Admitting: Emergency Medicine

## 2014-10-09 ENCOUNTER — Emergency Department (HOSPITAL_COMMUNITY)
Admission: EM | Admit: 2014-10-09 | Discharge: 2014-10-09 | Disposition: A | Discharge status: Home / Self Care | Payer: Commercial Managed Care - HMO | Attending: Emergency Medicine | Admitting: Emergency Medicine

## 2014-10-09 DIAGNOSIS — J45909 Unspecified asthma, uncomplicated: Secondary | ICD-10-CM | POA: Diagnosis not present

## 2014-10-09 DIAGNOSIS — Z8719 Personal history of other diseases of the digestive system: Secondary | ICD-10-CM | POA: Insufficient documentation

## 2014-10-09 DIAGNOSIS — Z79899 Other long term (current) drug therapy: Secondary | ICD-10-CM | POA: Diagnosis not present

## 2014-10-09 DIAGNOSIS — G8929 Other chronic pain: Secondary | ICD-10-CM | POA: Diagnosis not present

## 2014-10-09 DIAGNOSIS — Z8619 Personal history of other infectious and parasitic diseases: Secondary | ICD-10-CM | POA: Insufficient documentation

## 2014-10-09 DIAGNOSIS — G43909 Migraine, unspecified, not intractable, without status migrainosus: Secondary | ICD-10-CM | POA: Insufficient documentation

## 2014-10-09 DIAGNOSIS — F1120 Opioid dependence, uncomplicated: Secondary | ICD-10-CM | POA: Insufficient documentation

## 2014-10-09 DIAGNOSIS — Z760 Encounter for issue of repeat prescription: Secondary | ICD-10-CM

## 2014-10-09 DIAGNOSIS — M549 Dorsalgia, unspecified: Secondary | ICD-10-CM | POA: Diagnosis not present

## 2014-10-09 DIAGNOSIS — F319 Bipolar disorder, unspecified: Secondary | ICD-10-CM | POA: Insufficient documentation

## 2014-10-09 DIAGNOSIS — Z8739 Personal history of other diseases of the musculoskeletal system and connective tissue: Secondary | ICD-10-CM | POA: Insufficient documentation

## 2014-10-09 DIAGNOSIS — E039 Hypothyroidism, unspecified: Secondary | ICD-10-CM | POA: Insufficient documentation

## 2014-10-09 MED ORDER — ONDANSETRON 4 MG PO TBDP
4.0000 mg | ORAL_TABLET | Freq: Once | ORAL | Status: AC
  Administered 2014-10-09: 4 mg via ORAL
  Filled 2014-10-09: qty 1

## 2014-10-09 MED ORDER — ONDANSETRON HCL 4 MG PO TABS: 4.0000 mg | ORAL_TABLET | Freq: Three times a day (TID) | ORAL | Status: DC | PRN

## 2014-10-09 NOTE — Discharge Instructions (Signed)
Opioid Withdrawal °Opioids are a group of narcotic drugs. They include the street drug heroin. They also include pain medicines, such as morphine, hydrocodone, oxycodone, and fentanyl. Opioid withdrawal is a group of characteristic physical and mental signs and symptoms. It typically occurs if you have been using opioids daily for several weeks or longer and stop using or rapidly decrease use. Opioid withdrawal can also occur if you have used opioids daily for a long time and are given a medicine to block the effect.  °SIGNS AND SYMPTOMS °Opioid withdrawal includes three or more of the following symptoms:  °· Depressed, anxious, or irritable mood. °· Nausea or vomiting. °· Muscle aches or spasms.   °· Watery eyes.    °· Runny nose. °· Dilated pupils, sweating, or hairs standing on end. °· Diarrhea or intestinal cramping. °· Yawning.   °· Fever. °· Increased blood pressure. °· Fast pulse. °· Restlessness or trouble sleeping. °These signs and symptoms occur within several hours of stopping or reducing short-acting opioids, such as heroin. They can occur within 3 days of stopping or reducing long-acting opioids, such as methadone. Withdrawal begins within minutes of receiving a drug that blocks the effects of opioids, such as naltrexone or naloxone. °DIAGNOSIS  °Opioid use disorder is diagnosed by your health care provider. You will be asked about your symptoms, drug and alcohol use, medical history, and use of medicines. A physical exam may be done. Lab tests may be ordered. Your health care provider may have you see a mental health professional.  °TREATMENT  °The treatment for opioid withdrawal is usually provided by medical doctors with special training in substance use disorders (addiction specialists). The following medicines may be included in treatment: °· Opioids given in place of the abused opioid. They turn on opioid receptors in the brain and lessen or prevent withdrawal symptoms. They are gradually  decreased (opioid substitution and taper). °· Non-opioids that can lessen certain opioid withdrawal symptoms. They may be used alone or with opioid substitution and taper. °Successful long-term recovery usually requires medicine, counseling, and group support. °HOME CARE INSTRUCTIONS  °· Take medicines only as directed by your health care provider. °· Check with your health care provider before starting new medicines. °· Keep all follow-up visits as directed by your health care provider. °SEEK MEDICAL CARE IF: °· You are not able to take your medicines as directed. °· Your symptoms get worse. °· You relapse. °SEEK IMMEDIATE MEDICAL CARE IF: °· You have serious thoughts about hurting yourself or others. °· You have a seizure. °· You lose consciousness. °Document Released: 03/08/2003 Document Revised: 07/20/2013 Document Reviewed: 03/18/2013 °ExitCare® Patient Information ©2015 [employer]. This information is not intended to replace advice given to you by your health care provider. Make sure you discuss any questions you have with your health care provider. ° °Emergency Department Resource Guide °1) Find a Doctor and Pay Out of Pocket °Although you won't have to find out who is covered by your insurance plan, it is a good idea to ask around and get recommendations. You will then need to call the office and see if the doctor you have chosen will accept you as a new patient and what types of options they offer for patients who are self-pay. Some doctors offer discounts or will set up payment plans for their patients who do not have insurance, but you will need to ask so you aren't surprised when you get to your appointment. ° °2) Contact Your Local Health Department °Not all health departments have   doctors that can see patients for sick visits, but many do, so it is worth a call to see if yours does. If you don't know where your local health department is, you can check in your phone book. The CDC also has a tool to  help you locate your state's health department, and many state websites also have listings of all of their local health departments. ° °3) Find a Walk-in Clinic °If your illness is not likely to be very severe or complicated, you may want to try a walk in clinic. These are popping up all over the country in pharmacies, drugstores, and shopping centers. They're usually staffed by nurse practitioners or physician assistants that have been trained to treat common illnesses and complaints. They're usually fairly quick and inexpensive. However, if you have serious medical issues or chronic medical problems, these are probably not your best option. ° °No Primary Care Doctor: °- Call [facility] at  832-8000 - they can help you locate a primary care doctor that  accepts your insurance, provides certain services, etc. °- Physician Referral Service- 1-800-533-3463 ° °Chronic Pain Problems: °Organization         Address  Phone   Notes  °[facility]  (336) 297-2271 Patients need to be referred by their primary care doctor.  ° °Medication Assistance: °Organization         Address  Phone   Notes  °[place] [facility] [address] °[place], [place] [address] (336) 641-8030 --Must be a resident of [place] °-- Must have NO insurance coverage whatsoever (no Medicaid/ Medicare, etc.) °-- The pt. MUST have a primary care doctor that directs their care regularly and follows them in the community °  °[facility]  (866) 331-1348   °[employer]  (888) 892-1162   ° °Agencies that provide inexpensive medical care: °Organization         Address  Phone   Notes  °[facility]  (336) 832-8035   °[facility]    (336) 832-7272   °[facility] [address] °[place], [place] [address] (336) 832-4777   °[facility] [address], °[place] (336) 271-4999   °[facility]    (336) 373-0678   °[facility]    (336) 272-1050   °[facility] ° [address], [place] Phone:  (336) 832-4444, Fax:  (336) 832-4440 Hours of Operation:  9 am - 6 pm, M-F.  Also accepts Medicaid/Medicare and self-pay.  °[facility] ° [address], [place] Phone: (336) 832-3150, Fax: (336) 832-3151. Hours of Operation:  8:30 am - 5:30 pm, M-F.  Also accepts Medicaid and self-pay.  °[facility] [place] [address], [place] Phone: (336) 878-6027   °[facility] [address], [place], [place] (336)723-1848, Ext. 123 Mondays & Thursdays: 7-9 AM.  First 15 patients are seen on a first come, first serve basis. °  ° °Medicaid-accepting [place] Providers: ° °Organization         Address  Phone   Notes  °[facility] [address], [place] (336) 641-2100 Also accepts self-pay patients.  °[facility] [address], [place] ° (336) 856-9996   °[facility] [address], [place] (336) 288-8857   °[facility] [address]   [place] [address], [place] (336) 299-7000   °Veita Bland [address], [place]  ° (336) 373-1557 Only accepts [place] Access Medicaid patients after they have their name applied to their card.  ° °Self-Pay (no insurance) in [place]: ° °Organization         Address  Phone   Notes  °Sickle Cell Patients, [facility] [address], [place] (336) 832-1970   °[facility] [address], [place] (336) 832-4400   °[facility] [place] ° [address], [place] (336) 992-4800   °[facility]/Dr. Osei-Bonsu ° [address] [place] [address], [place] or [address], [place] (336) 841-8500 Phone number for both [place] and [place] locations is the same.  °[facility] [address],  [place] (336) 299-0000   °[facility] [address] [place] [address], [place] or [address] (336) 852-7530 °(336) 878-2260   °[facility] [address], [place] (336) 350-1642, phone; (336) 294-5005, fax Sees patients 1st and 3rd Saturday of every month.  Must not qualify for public or private insurance (i.e. Medicaid, Medicare, [place] Health Choice, Veterans' Benefits) • Household income should be no more than 200% of the poverty level •The clinic cannot treat you if you are pregnant or think you are pregnant • Sexually transmitted diseases are not treated at the clinic.  ° ° °Dental Care: °Organization         Address  Phone  Notes  °[facility] [facility] [address], [place] (336) 641-6152 Accepts children up to age 21 who are enrolled in Medicaid or [place] Health Choice; pregnant women with a Medicaid card; and children who have applied for Medicaid or [place] Health Choice, but were declined, whose parents can pay a reduced fee at time of service.  °[facility] [place]  [address], [place] (336) 641-7733 Accepts children up to age 21 who are enrolled in Medicaid or [place] Health Choice; pregnant women with a Medicaid card; and children who have applied for Medicaid or [place] Health Choice, but were declined, whose parents can pay a reduced fee at time of service.  °[facility] ° [address], [place] (336) 641-4533 Patients are seen by appointment only. Walk-ins are not accepted. [facility] will see patients 18 years of age and older. °Monday - Tuesday (8am-5pm) °Most Wednesdays (8:30-5pm) °$30 per visit, cash only  °[facility] ° [address], [place] (336) 641-4533 Patients are seen by appointment only. Walk-ins are not accepted. [facility] will see patients 18 years of age and older. °One Wednesday Evening  (Monthly: Volunteer Based).  $30 per visit, cash only  °[facility]  (919) 537-3737 for adults; Children under age 4, call Graduate Pediatric Dentistry at (919) [phone]. Children aged 4-14, please call (919) [phone] to request a pediatric application. ° Dental services are provided in all areas of dental care including fillings, crowns and bridges, complete and partial dentures, implants, gum treatment, root canals, and extractions. Preventive care is also provided. Treatment is provided to both adults and children. °Patients are selected via a lottery and there is often a waiting list. °  °[facility] [address], °[place] ° (336) 763-8833 [email or web address] °  °[facility] [address], [place], [place] (336)723-1848, Ext. 123 Second and Fourth Thursday of each month, opens at 6:30 AM; Clinic ends at 9 AM.  Patients are seen on a first-come first-served basis, and a limited number are seen during each clinic.  ° °[facility] ° [address], [place], [place] (336) 723-7904   Eligibility Requirements °You must have lived in [place], [place], or [place] counties for at least the last three months. °  You cannot be eligible for state or federal sponsored healthcare insurance, including Veterans Administration, Medicaid, or Medicare. °  You generally cannot be eligible for healthcare insurance through your employer.  °  How to apply: °Eligibility screenings are held every Tuesday and Wednesday afternoon from 1:00 pm until 4:00 pm. You do not need an appointment for the interview!  °[facility] [address], [place], [place] 336-631-2330   °[facility]  336-342-8273   °[facility]  336-703-3100   °[facility]  336-570-6415   ° °Behavioral Health Resources in the Community: °Intensive Outpatient Programs °Organization         Address  Phone  Notes  °[place]  [facility] [address], [place], [place] 336-878-6098   °[facility] [address], [place], [place] 336-832-9800   °[facility] [address], [place], [place] ° 336-882-2125   °[facility] [address],  °[place], [place] 1-800-853-5163 or 336-641-4981   °Substance Abuse Resources °Organization         Address  Phone  Notes  °[facility]  336-882-2125   °[facility]  336-784-9470   °[facility]  336-285-9073   °[facility]  336-845-3988   °Residential & Outpatient Substance Abuse Program  1-800-659-3381   °Psychological Services °Organization         Address  Phone  Notes  °[facility]  336- 832-9600   °[facility]  336- 378-7881   °[facility] [address], [place] 1-800-853-5163 or 336-641-4981   ° °Mobile Crisis Teams °Organization         Address  Phone  Notes  °[facility], Mobile Crisis Care Unit  1-877-626-1772   °[facility]ive °[facility] ° [address] [place], [place] 336-834-9664   °Sharon DeEsch [address] °[place] 336-554-5454   ° °Self-Help/Support Groups °Organization         Address  Phone             Notes  °[facility] - variety of support groups  336- 373-1402 Call for more information  °Narcotics Anonymous (NA), [facility] [address], °[place]  2 meetings at this location  ° °Residential Treatment Programs °Organization         Address  Phone  Notes  °[facility] [address],    °[place]  1-866-801-8205   °[facility] ° [address], [place], [place] 704-293-8524   °[facility] [address], [place] 336-845-3988 Admissions: 8am-3pm M-F  °[facility] [address]    °[place], [place] 336-841-1104   °[facility] [address],  [place], [place] 336-379-7146   °[facility] [address]  °[place], [place] 336-285-9073   °[facility] - Intensive Outpatient [address], [place],   [place] 336-852-3033   °[facility] [address]  °[place], [place] 1-877-615-2722 or 336-784-9470   °[facility] ([facility] [address] [place], [place] 336-227-7417 Accepts Medicaid  °[facility] [address]  °[place] 1-800-659-3381 Substance Abuse/Addiction Treatment  ° °[place] Behavioral Health Resources °Organization         Address  Phone  Notes  °[facility]  (888) 581-9988   °Julie Brannon, PhD [address] [place], [place]   (336) 349-5553 or (336) 951-0000   °[facility]   [address] °[place], [place] (336) 349-4454   °[facility] [address], [place], [place] (336) 342-8316 Insurance/Medicaid/sponsorship through [facility]  °[facility] [address]                                    [place], [place] (336) 342-8316 Therapy/tele-psych/case  °[facility] [address]  ° [place], [place] (336) 349-2233    °Dr. Arfeen  (336) 349-4544   °[facility] [place]  [employer] [place] [facility] 1) [address], [place] °2) [address], [place] °3)  [address] [place] [address], [place] (336) 349-3220 °(336) 342-7768 ° °(336) 342-8140   °[place] [facility] (336) 342-1394 or (336) 342-3537 (After Hours)    ° ° °

## 2014-10-09 NOTE — ED Notes (Signed)
Patient with chronic back pain that is out of her pain meds.  She has been out of meds for two days and called Dr [facility] office with no call back.  Patient states that she is afraid that she is going to go into withdrawls.  Patient has had back surgery about one month ago.

## 2014-10-09 NOTE — ED Notes (Addendum)
Pt sts she takes 15mg  tablets of Oxycodone, prescribed to be taken 4 times a day.  Pt sts she knows she is taking more than she is supposed to be.  Pt sts she takes the pills whenever she feels the pain, not on the time as prescribed.  Pt sts she has had this issue for over 1 year dealing with her chronic back pain and surgical pain.  Pt has a hx of alcoholism, but was 25 years sober before these medications became a problem for her.  Pt sts her pain has now moved to her hips and she was put on Gabapentin, but she has not been taking as prescribed (less than prescribed).  Pt sts she is supposed to see a pain specialist with her doctor because of her over use of her medication.  Pt sts she had an intense episode of "withdrawal" approx 1 year ago and she passed out at [facility] and became "unresponsive".  Pt sts her friend crushed up two pills and rubbed them under her tongue, and pt was able to "wake up immediately" but she "wasn't right for at least 45 minutes.  Pt is worried she will have another episode.  Sts she feels the "signs" at this time (nausea, shakiness).  Pt sts she was encouraged by her AA sponsor to take some additional Xanax from her supply to "help her through the night".  Pt sts she took "two 10mg  tablets" over approx 3 hours.  Pts pupils are dilated and sluggish.  Pt sts her last dose of oxycodone was two days ago, and she cannot reach anyone at her office for a refill.

## 2014-10-09 NOTE — ED Provider Notes (Signed)
CSN: [id number]     Arrival date & time 10/09/14  0236 History   First MD Initiated Contact with Patient 10/09/14 0505     Chief Complaint  Patient presents with  . Back Pain     (Consider location/radiation/quality/duration/timing/severity/associated sxs/prior Treatment) HPI Patient with history of oxycodone abuse and addiction presents asking for refill of her oxycodone prescription. Last took her medications yesterday. Denies nausea, vomiting, diarrhea or tremulousness. She has ongoing chronic back pain which is unchanged this time. Denies any urinary symptoms, focal weakness or numbness. Past Medical History  Diagnosis Date  . Asthma   . Bipolar disorder   . Depression   . DDD (degenerative disc disease)   . Hiatal hernia   . Osteoporosis 11/2013    T score -2.6 AP spine. 1 dose Reclast with reaction Prolia since 10/2011 most recent DEXA shows significant improvement at all measured sites.  . S/P adenoidectomy   . S/P tonsillectomy   . Hypothyroid   . Migraines   . Herpes    Past Surgical History  Procedure Laterality Date  . Cesarean section    . Dilation and curettage of uterus    . Hysteroscopy    . Compression fracture spine    . Lumbar fusion    . Eye surgery Bilateral     CATARACTS REMOVED, NO LENS PLACED   Family History  Problem Relation Age of Onset  . Adopted: Yes   History  Substance Use Topics  . Smoking status: Never Smoker   . Smokeless tobacco: Not on file  . Alcohol Use: No   OB History    Gravida Para Term Preterm AB TAB SAB Ectopic Multiple Living   3 1 1  2     1      Review of Systems  Constitutional: Negative for fever, chills and fatigue.  Respiratory: Negative for shortness of breath.   Cardiovascular: Negative for chest pain.  Gastrointestinal: Negative for nausea, vomiting, abdominal pain, diarrhea and constipation.  Genitourinary: Negative for dysuria, frequency, flank pain and difficulty urinating.  Musculoskeletal: Positive for  back pain. Negative for neck pain and neck stiffness.  Skin: Negative for rash and wound.  Neurological: Negative for dizziness, weakness, light-headedness, numbness and headaches.  All other systems reviewed and are negative.     Allergies  Lithium carbonate; Prednisone; and Sulfa antibiotics  Home Medications   Prior to Admission medications   Medication Sig Start Date End Date Taking? Authorizing Provider  acyclovir (ZOVIRAX) 400 MG tablet Take 400 mg by mouth every 4 (four) hours as needed (outbreak).     Historical Provider, MD  acyclovir ointment (ZOVIRAX) 5 % APPLY TO AFFECTED AREA EVERY 3 HOURS 01/04/14   [name], NP  albuterol (PROVENTIL) (2.5 MG/3ML) 0.083% nebulizer solution Take 2.5 mg by nebulization as needed for wheezing or shortness of breath.    Historical Provider, MD  ALPRAZolam Duanne Moron) 0.5 MG tablet Take 0.5 mg by mouth 2 (two) times daily as needed for anxiety or sleep.     Historical Provider, MD  Azelastine HCl (ASTEPRO NA) Place 1 spray into the nose daily.     Historical Provider, MD  beclomethasone (QVAR) 80 MCG/ACT inhaler Inhale 1 puff into the lungs as needed (sob).     Historical Provider, MD  buPROPion (WELLBUTRIN XL) 300 MG 24 hr tablet Take 300 mg by mouth daily.    Historical Provider, MD  Calcium Carbonate-Vitamin D (CALCIUM + D PO) Take 1 tablet by mouth daily.  Historical Provider, MD  FLUoxetine (PROZAC) 40 MG capsule Take 40 mg by mouth every morning. 06/09/14   Historical Provider, MD  gabapentin (NEURONTIN) 100 MG capsule Take 100 mg by mouth 2 (two) times daily. 06/09/14   Historical Provider, MD  levothyroxine (SYNTHROID) 125 MCG tablet Take 1 tablet (125 mcg total) by mouth daily before breakfast. 11/17/13   [name], NP  methocarbamol (ROBAXIN) 500 MG tablet Take 500 mg by mouth 3 (three) times daily. 06/14/14   Historical Provider, MD  Multiple Vitamin (MULTIVITAMIN) tablet Take 1 tablet by mouth daily.    Historical Provider, MD   ondansetron (ZOFRAN) 4 MG tablet Take 1 tablet (4 mg total) by mouth every 8 (eight) hours as needed for nausea or vomiting. 10/09/14   Julianne Rice, MD  OXcarbazepine (TRILEPTAL PO) Take 600 mg by mouth 2 (two) times daily.     Historical Provider, MD  oxyCODONE (OXYCONTIN) 15 MG TB12 Take 15 mg by mouth 4 (four) times daily as needed (pain).     Historical Provider, MD  pantoprazole (PROTONIX) 40 MG tablet Take 40 mg by mouth daily.    Historical Provider, MD  QUEtiapine (SEROQUEL) 50 MG tablet Take 50-100 mg by mouth at bedtime. 06/08/14   Historical Provider, MD  ranitidine (ZANTAC) 300 MG tablet Take 300 mg by mouth every evening. 06/09/14   Historical Provider, MD  tiotropium (SPIRIVA) 18 MCG inhalation capsule Place 18 mcg into inhaler and inhale daily.    Historical Provider, MD  valACYclovir (VALTREX) 500 MG tablet Daily for suppression or twice daily for 3-5 days as needed. 01/04/14   [name], NP   BP 187/81 mmHg  Pulse 88  Temp(Src) 98 F (36.7 C) (Oral)  Resp 22  Wt 142 lb 1.6 oz (64.456 kg)  SpO2 97% Physical Exam  Constitutional: She is oriented to person, place, and time. She appears well-developed and well-nourished. No distress.  Patient is very well-appearing without any evidence of withdrawal.  HENT:  Head: Normocephalic and atraumatic.  Mouth/Throat: Oropharynx is clear and moist.  Eyes: EOM are normal. Pupils are equal, round, and reactive to light.  Neck: Normal range of motion. Neck supple.  Cardiovascular: Normal rate and regular rhythm.   Pulmonary/Chest: Effort normal and breath sounds normal. No respiratory distress. She has no wheezes. She has no rales.  Abdominal: Soft. Bowel sounds are normal. She exhibits no distension and no mass. There is no tenderness. There is no rebound and no guarding.  Musculoskeletal: Normal range of motion. She exhibits no edema or tenderness.  Neurological: She is alert and oriented to person, place, and time.  Moving all  extremities without deficit. Sensation is grossly intact.  Skin: Skin is warm and dry. No rash noted. No erythema.  Psychiatric: She has a normal mood and affect. Her behavior is normal.  Nursing note and vitals reviewed.   ED Course  Procedures (including critical care time) Labs Review Labs Reviewed - No data to display  Imaging Review No results found.   EKG Interpretation None      MDM   Final diagnoses:  Medication refill  Uncomplicated opioid dependence    Patient with history of oxycodone abuse and addiction. Requesting refill of her pain medications in the emergency department. Advised that we would not be refilling but would give her prescriptions to treat her symptoms of withdrawal. She's been given resource guide for outpatient counseling and treatment. Return precautions given.    Julianne Rice, MD 10/09/14 [phone]

## 2014-10-11 ENCOUNTER — Encounter (HOSPITAL_COMMUNITY): Payer: Self-pay

## 2014-10-11 ENCOUNTER — Emergency Department (HOSPITAL_COMMUNITY): Payer: Commercial Managed Care - HMO

## 2014-10-11 ENCOUNTER — Emergency Department (HOSPITAL_COMMUNITY)
Admission: EM | Admit: 2014-10-11 | Discharge: 2014-10-12 | Disposition: A | Discharge status: Other Acute Inpatient Hospital | Payer: Commercial Managed Care - HMO | Attending: Emergency Medicine | Admitting: Emergency Medicine

## 2014-10-11 DIAGNOSIS — R45851 Suicidal ideations: Secondary | ICD-10-CM | POA: Diagnosis present

## 2014-10-11 DIAGNOSIS — F3130 Bipolar disorder, current episode depressed, mild or moderate severity, unspecified: Secondary | ICD-10-CM | POA: Diagnosis not present

## 2014-10-11 DIAGNOSIS — J45909 Unspecified asthma, uncomplicated: Secondary | ICD-10-CM | POA: Diagnosis not present

## 2014-10-11 DIAGNOSIS — Z8619 Personal history of other infectious and parasitic diseases: Secondary | ICD-10-CM | POA: Diagnosis not present

## 2014-10-11 DIAGNOSIS — E039 Hypothyroidism, unspecified: Secondary | ICD-10-CM | POA: Diagnosis not present

## 2014-10-11 DIAGNOSIS — Z8719 Personal history of other diseases of the digestive system: Secondary | ICD-10-CM | POA: Insufficient documentation

## 2014-10-11 DIAGNOSIS — J189 Pneumonia, unspecified organism: Secondary | ICD-10-CM | POA: Insufficient documentation

## 2014-10-11 DIAGNOSIS — F1310 Sedative, hypnotic or anxiolytic abuse, uncomplicated: Secondary | ICD-10-CM | POA: Insufficient documentation

## 2014-10-11 DIAGNOSIS — G43909 Migraine, unspecified, not intractable, without status migrainosus: Secondary | ICD-10-CM | POA: Insufficient documentation

## 2014-10-11 DIAGNOSIS — J9811 Atelectasis: Secondary | ICD-10-CM | POA: Diagnosis not present

## 2014-10-11 DIAGNOSIS — Z9089 Acquired absence of other organs: Secondary | ICD-10-CM | POA: Diagnosis not present

## 2014-10-11 DIAGNOSIS — F419 Anxiety disorder, unspecified: Secondary | ICD-10-CM | POA: Insufficient documentation

## 2014-10-11 DIAGNOSIS — Z79899 Other long term (current) drug therapy: Secondary | ICD-10-CM | POA: Diagnosis not present

## 2014-10-11 DIAGNOSIS — F319 Bipolar disorder, unspecified: Secondary | ICD-10-CM | POA: Insufficient documentation

## 2014-10-11 DIAGNOSIS — F1410 Cocaine abuse, uncomplicated: Secondary | ICD-10-CM | POA: Diagnosis not present

## 2014-10-11 DIAGNOSIS — Z8739 Personal history of other diseases of the musculoskeletal system and connective tissue: Secondary | ICD-10-CM | POA: Insufficient documentation

## 2014-10-11 LAB — COMPREHENSIVE METABOLIC PANEL
ALT: 13 U/L — ABNORMAL LOW (ref 14–54)
AST: 17 U/L (ref 15–41)
Albumin: 4 g/dL (ref 3.5–5.0)
Alkaline Phosphatase: 82 U/L (ref 38–126)
Anion gap: 5 (ref 5–15)
BUN: 15 mg/dL (ref 6–20)
CO2: 25 mmol/L (ref 22–32)
Calcium: 9.9 mg/dL (ref 8.9–10.3)
Chloride: 107 mmol/L (ref 101–111)
Creatinine, Ser: 0.78 mg/dL (ref 0.44–1.00)
GFR calc Af Amer: >60 mL/min (ref >60)
GFR calc non Af Amer: >60 mL/min (ref >60)
Glucose, Bld: 172 mg/dL — ABNORMAL HIGH (ref 65–99)
Potassium: 3.4 mmol/L — ABNORMAL LOW (ref 3.5–5.1)
Sodium: 137 mmol/L (ref 135–145)
Total Bilirubin: 0.4 mg/dL (ref 0.3–1.2)
Total Protein: 7 g/dL (ref 6.5–8.1)

## 2014-10-11 LAB — CBC
HCT: 36.1 % (ref 36.0–46.0)
Hemoglobin: 11.8 g/dL — ABNORMAL LOW (ref 12.0–15.0)
MCH: 28 pg (ref 26.0–34.0)
MCHC: 32.7 g/dL (ref 30.0–36.0)
MCV: 85.7 fL (ref 78.0–100.0)
Platelets: 385 10*3/uL (ref 150–400)
RBC: 4.21 MIL/uL (ref 3.87–5.11)
RDW: 14.3 % (ref 11.5–15.5)
WBC: 7.5 10*3/uL (ref 4.0–10.5)

## 2014-10-11 LAB — ETHANOL: Alcohol, Ethyl (B): <5 mg/dL (ref <5)

## 2014-10-11 MED ORDER — BUDESONIDE 0.25 MG/2ML IN SUSP: 0.2500 mg | RESPIRATORY_TRACT | Status: DC | PRN

## 2014-10-11 MED ORDER — TIOTROPIUM BROMIDE MONOHYDRATE 18 MCG IN CAPS
18.0000 ug | ORAL_CAPSULE | Freq: Every day | RESPIRATORY_TRACT | Status: DC
  Administered 2014-10-11: 18 ug via RESPIRATORY_TRACT
  Filled 2014-10-11: qty 5

## 2014-10-11 MED ORDER — ONDANSETRON HCL 4 MG PO TABS
4.0000 mg | ORAL_TABLET | Freq: Three times a day (TID) | ORAL | Status: DC | PRN
  Administered 2014-10-11 – 2014-10-12 (×2): 4 mg via ORAL
  Filled 2014-10-11 (×2): qty 1

## 2014-10-11 MED ORDER — BECLOMETHASONE DIPROPIONATE 80 MCG/ACT IN AERS: 1.0000 | INHALATION_SPRAY | RESPIRATORY_TRACT | Status: DC | PRN

## 2014-10-11 MED ORDER — GABAPENTIN 300 MG PO CAPS
300.0000 mg | ORAL_CAPSULE | Freq: Once | ORAL | Status: AC
  Administered 2014-10-11: 300 mg via ORAL
  Filled 2014-10-11: qty 1

## 2014-10-11 MED ORDER — ACETAMINOPHEN 325 MG PO TABS
650.0000 mg | ORAL_TABLET | ORAL | Status: DC | PRN
  Administered 2014-10-11 – 2014-10-12 (×3): 650 mg via ORAL
  Filled 2014-10-11 (×3): qty 2

## 2014-10-11 MED ORDER — GABAPENTIN 600 MG PO TABS
300.0000 mg | ORAL_TABLET | Freq: Once | ORAL | Status: DC
  Filled 2014-10-11: qty 0.5

## 2014-10-11 MED ORDER — ALUM & MAG HYDROXIDE-SIMETH 200-200-20 MG/5ML PO SUSP: 30.0000 mL | ORAL | Status: DC | PRN

## 2014-10-11 MED ORDER — BUPROPION HCL ER (XL) 300 MG PO TB24
300.0000 mg | ORAL_TABLET | Freq: Every day | ORAL | Status: DC
  Administered 2014-10-11 – 2014-10-12 (×2): 300 mg via ORAL
  Filled 2014-10-11 (×2): qty 1

## 2014-10-11 MED ORDER — OXCARBAZEPINE 300 MG PO TABS
600.0000 mg | ORAL_TABLET | Freq: Two times a day (BID) | ORAL | Status: DC
  Administered 2014-10-11 – 2014-10-12 (×3): 600 mg via ORAL
  Filled 2014-10-11 (×4): qty 2

## 2014-10-11 MED ORDER — QUETIAPINE FUMARATE 50 MG PO TABS
50.0000 mg | ORAL_TABLET | Freq: Every day | ORAL | Status: DC
  Administered 2014-10-11: 100 mg via ORAL
  Filled 2014-10-11: qty 2

## 2014-10-11 MED ORDER — ALBUTEROL SULFATE (2.5 MG/3ML) 0.083% IN NEBU: 2.5000 mg | INHALATION_SOLUTION | RESPIRATORY_TRACT | Status: DC | PRN

## 2014-10-11 MED ORDER — ALPRAZOLAM 0.5 MG PO TABS
0.5000 mg | ORAL_TABLET | Freq: Two times a day (BID) | ORAL | Status: DC | PRN
  Administered 2014-10-11 – 2014-10-12 (×3): 0.5 mg via ORAL
  Filled 2014-10-11 (×3): qty 1

## 2014-10-11 MED ORDER — LORAZEPAM 1 MG PO TABS
1.0000 mg | ORAL_TABLET | Freq: Three times a day (TID) | ORAL | Status: DC | PRN
  Administered 2014-10-11: 1 mg via ORAL
  Filled 2014-10-11: qty 1

## 2014-10-11 MED ORDER — PANTOPRAZOLE SODIUM 40 MG PO TBEC
40.0000 mg | DELAYED_RELEASE_TABLET | Freq: Every day | ORAL | Status: DC
  Administered 2014-10-11 – 2014-10-12 (×2): 40 mg via ORAL
  Filled 2014-10-11 (×2): qty 1

## 2014-10-11 MED ORDER — FLUOXETINE HCL 20 MG PO CAPS
40.0000 mg | ORAL_CAPSULE | Freq: Every morning | ORAL | Status: DC
Start: 2014-10-11 — End: 2014-10-12
  Administered 2014-10-11 – 2014-10-12 (×2): 40 mg via ORAL
  Filled 2014-10-11 (×2): qty 2
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
## 2014-10-11 NOTE — ED Provider Notes (Signed)
CSN: [id number]     Arrival date & time 10/11/14  8022 History   First MD Initiated Contact with Patient 10/11/14 1032     Chief Complaint  Patient presents with  . Opiate Detox    . Xanax Detox   . Suicidal   Victoria Levine is a 64 y.o. female with a history of ipolar disorder, depression, degenerative disc disease and opiate dependency presents to the emergency department requesting detox from opiates and Xanax. The patient reports that she has been receiving opiates from pain management and has been abusing them. She reports she's been taking more than prescribed. She reports she was unable to get a refill on them 5 days ago and has been narcotic free for the past 5 days. She presents here to the emergency department with her sponsor from [place]. Patient also reports history of alcohol abuse. Patient also endorses depression and previous suicidal ideations last week. She denies any suicidal ideations or homicidal ideations currently. Patient reports just generally feeling "like crap." But that she has not been taking narcotics. She reports she has been taking Xanax and her last dose was at 7:30 this morning. She reports taking between 3 and 5 one mg pills of Xanax daily. She endorses abdominal cramping but denies abdominal pain. The patient denies fevers, chills, nausea, vomiting, diarrhea, headache, lightheadedness, dizziness, chest pain, shortness of breath, suicidal ideations, homicidal ideations, visual hallucinations or auditory hallucinations.  (Consider location/radiation/quality/duration/timing/severity/associated sxs/prior Treatment) HPI  Past Medical History  Diagnosis Date  . Asthma   . Bipolar disorder   . Depression   . DDD (degenerative disc disease)   . Hiatal hernia   . Osteoporosis 11/2013    T score -2.6 AP spine. 1 dose Reclast with reaction Prolia since 10/2011 most recent DEXA shows significant improvement at all measured sites.  . S/P adenoidectomy   . S/P tonsillectomy    . Hypothyroid   . Migraines   . Herpes    Past Surgical History  Procedure Laterality Date  . Cesarean section    . Dilation and curettage of uterus    . Hysteroscopy    . Compression fracture spine    . Lumbar fusion    . Eye surgery Bilateral     CATARACTS REMOVED, NO LENS PLACED   Family History  Problem Relation Age of Onset  . Adopted: Yes   History  Substance Use Topics  . Smoking status: Never Smoker   . Smokeless tobacco: Not on file  . Alcohol Use: No   OB History    Gravida Para Term Preterm AB TAB SAB Ectopic Multiple Living   3 1 1  2     1      Review of Systems  Constitutional: Negative for fever and chills.  HENT: Negative for congestion and sore throat.   Eyes: Negative for visual disturbance.  Respiratory: Negative for cough, shortness of breath and wheezing.   Cardiovascular: Negative for chest pain and palpitations.  Gastrointestinal: Negative for nausea, vomiting, abdominal pain and diarrhea.  Genitourinary: Negative for dysuria.  Musculoskeletal: Negative for back pain and neck pain.  Skin: Negative for rash and wound.  Neurological: Negative for headaches.  Psychiatric/Behavioral: Positive for dysphoric mood. Negative for suicidal ideas, hallucinations and self-injury. The patient is nervous/anxious.       Allergies  Lithium carbonate; Prednisone; and Sulfa antibiotics  Home Medications   Prior to Admission medications   Medication Sig Start Date End Date Taking? Authorizing Provider  acyclovir ointment (  ZOVIRAX) 5 % APPLY TO AFFECTED AREA EVERY 3 HOURS 01/04/14  Yes [name], NP  albuterol (PROVENTIL) (2.5 MG/3ML) 0.083% nebulizer solution Take 2.5 mg by nebulization as needed for wheezing or shortness of breath.   Yes Historical Provider, MD  ALPRAZolam Duanne Moron) 0.5 MG tablet Take 0.5 mg by mouth 2 (two) times daily as needed for anxiety or sleep.    Yes Historical Provider, MD  beclomethasone (QVAR) 80 MCG/ACT inhaler Inhale 1 puff  into the lungs as needed (sob).    Yes Historical Provider, MD  buPROPion (WELLBUTRIN XL) 300 MG 24 hr tablet Take 300 mg by mouth daily.   Yes Historical Provider, MD  Calcium Carbonate-Vitamin D (CALCIUM + D PO) Take 1 tablet by mouth daily.    Yes Historical Provider, MD  FLUoxetine (PROZAC) 40 MG capsule Take 40 mg by mouth every morning. 06/09/14  Yes Historical Provider, MD  gabapentin (NEURONTIN) 100 MG capsule Take 100 mg by mouth 2 (two) times daily. 06/09/14  Yes Historical Provider, MD  levothyroxine (SYNTHROID) 125 MCG tablet Take 1 tablet (125 mcg total) by mouth daily before breakfast. 11/17/13  Yes [name], NP  methocarbamol (ROBAXIN) 500 MG tablet Take 500 mg by mouth 3 (three) times daily. 06/14/14  Yes Historical Provider, MD  Multiple Vitamin (MULTIVITAMIN) tablet Take 1 tablet by mouth daily.   Yes Historical Provider, MD  ondansetron (ZOFRAN) 4 MG tablet Take 1 tablet (4 mg total) by mouth every 8 (eight) hours as needed for nausea or vomiting. 10/09/14  Yes Julianne Rice, MD  oxcarbazepine (TRILEPTAL) 600 MG tablet Take 1 tablet by mouth 2 (two) times daily. 10/08/14  Yes Historical Provider, MD  oxyCODONE (OXYCONTIN) 15 MG TB12 Take 15 mg by mouth 4 (four) times daily as needed (pain).    Yes Historical Provider, MD  pantoprazole (PROTONIX) 40 MG tablet Take 40 mg by mouth daily.   Yes Historical Provider, MD  QUEtiapine (SEROQUEL) 50 MG tablet Take 50-100 mg by mouth at bedtime. 06/08/14  Yes Historical Provider, MD  ranitidine (ZANTAC) 300 MG tablet Take 300 mg by mouth every evening. 06/09/14  Yes Historical Provider, MD  rizatriptan (MAXALT-MLT) 10 MG disintegrating tablet Take 10 mg by mouth daily as needed. 07/11/14  Yes Historical Provider, MD  tiotropium (SPIRIVA) 18 MCG inhalation capsule Place 18 mcg into inhaler and inhale daily.   Yes Historical Provider, MD  valACYclovir (VALTREX) 500 MG tablet Daily for suppression or twice daily for 3-5 days as needed. 01/04/14   Yes [name], NP  OXcarbazepine (TRILEPTAL PO) Take 600 mg by mouth 2 (two) times daily.     Historical Provider, MD   BP 168/95 mmHg  Pulse 74  Temp(Src) 98.4 F (36.9 C) (Oral)  Resp 18  SpO2 94% Physical Exam  Constitutional: She is oriented to person, place, and time. She appears well-developed and well-nourished. No distress.  Nontoxic appearing.  HENT:  Head: Normocephalic and atraumatic.  Mouth/Throat: Oropharynx is clear and moist. No oropharyngeal exudate.  Eyes: Conjunctivae are normal. Pupils are equal, round, and reactive to light. Right eye exhibits no discharge. Left eye exhibits no discharge.  Neck: Neck supple.  Cardiovascular: Normal rate, regular rhythm, normal heart sounds and intact distal pulses.  Exam reveals no gallop and no friction rub.   No murmur heard. Pulmonary/Chest: Effort normal and breath sounds normal. No respiratory distress. She has no wheezes. She has no rales.  Abdominal: Soft. She exhibits no distension. There is no  tenderness. There is no guarding.  Musculoskeletal: She exhibits no edema.  Lymphadenopathy:    She has no cervical adenopathy.  Neurological: She is alert and oriented to person, place, and time. Coordination normal.  Skin: Skin is warm and dry. No rash noted. She is not diaphoretic. No erythema. No pallor.  Psychiatric: Her speech is normal and behavior is normal. Her mood appears anxious. Her affect is not angry and not blunt. She is not actively hallucinating. She expresses no homicidal and no suicidal ideation.  Patient appears slightly anxious. She denies suicidal or homicidal ideations currently. She endorses suicidal ideations last week.  Nursing note and vitals reviewed.   ED Course  Procedures (including critical care time) Labs Review Labs Reviewed  CBC - Abnormal; Notable for the following:    Hemoglobin 11.8 (*)    All other components within normal limits  COMPREHENSIVE METABOLIC PANEL - Abnormal; Notable for  the following:    Potassium 3.4 (*)    Glucose, Bld 172 (*)    ALT 13 (*)    All other components within normal limits  ETHANOL  URINE RAPID DRUG SCREEN, HOSP PERFORMED  URINALYSIS, ROUTINE W REFLEX MICROSCOPIC (NOT AT [facility])    Imaging Review Dg Chest 1 View  10/11/2014   CLINICAL DATA:  Opiate withdrawal  EXAM: CHEST  1 VIEW  COMPARISON:  01/27/2012  FINDINGS: Lungs remain markedly under aerated. Bibasilar atelectasis for scarring remains unchanged. Normal vascularity. No pneumothorax or pleural effusion.  IMPRESSION: No active disease.   Electronically Signed   By: Marybelle Killings M.D.   On: 10/11/2014 15:32     EKG Interpretation   Date/Time:  Monday October 11 2014 14:56:55 EDT Ventricular Rate:  77 PR Interval:  152 QRS Duration: 84 QT Interval:  362 QTC Calculation: 409 R Axis:   74 Text Interpretation:  Normal sinus rhythm Cannot rule out Anterior infarct  , age undetermined Abnormal ECG No significant change since last tracing  Confirmed by KNAPP  MD-J, JON ([id number]) on 10/11/2014 4:17:45 PM      Filed Vitals:   10/11/14 1441 10/11/14 1749 10/12/14 0623 10/12/14 1225  BP: 134/75 155/86 151/80 168/95  Pulse: 82 88 64 74  Temp: 98.7 F (37.1 C) 99 F (37.2 C) 99 F (37.2 C) 98.4 F (36.9 C)  TempSrc: Oral Oral Oral Oral  Resp: 18 18 16 18   SpO2: 100% 98% 95% 94%     MDM   Final diagnoses:  Pneumonia   #1: SI  #2: Narcotic abuse.   This  is a 64 y.o. female with a history of ipolar disorder, depression, degenerative disc disease and opiate dependency presents to the emergency department requesting detox from opiates and Xanax. The patient reports that she has been receiving opiates from pain management and has been abusing them. She reports she's been taking more than prescribed. She reports she was unable to get a refill on them 5 days ago and has been narcotic free for the past 5 days. She presents here to the emergency department with her sponsor from [place]. Initially  the patient denies suicidal or homicidal ideations.later the patient endorses ideations to behavioral health team. Plan is for the patient to be moved to the back for further evaluation. CBC and CMP are unremarkable. She has a negative alcohol level. Urine drug screen is pending. Psych holding orders place and the patient moved to the psych ED.      Waynetta Pean, PA-C 10/12/14 1233  Dorie Rank, MD  10/12/14 1650 

## 2014-10-11 NOTE — ED Notes (Signed)
Bed: WA32 Expected date:  Expected time:  Means of arrival:  Comments: Triage 3 
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
## 2014-10-11 NOTE — ED Notes (Signed)
Per TTS, Pt is now reporting SI w/ several plans including ODing, shooting self, or jumping off a bridge.

## 2014-10-11 NOTE — ED Notes (Addendum)
Pt presents wanting detox from opiates and xanax detox.  Pt reports substance issues since 2009.  Last opiate use x 3 days and the Pt had Xanax 2mg  this morning.  Pt c/o restlessness, emesis, and diarrhea.  Pt presents w/ her sponsor.  Sts "she was prescribed these medications, but has been abusing/ overtaking them."  Pt denies SI/HI/AV, but sts "I've had thoughts of hurting myself in the past."

## 2014-10-11 NOTE — ED Notes (Signed)
Unable to collect labs pharmacy is in the room.

## 2014-10-11 NOTE — ED Notes (Signed)
Pt changed into scrubs and wanded by security.  All belongings taken w/ Sponsor.

## 2014-10-12 ENCOUNTER — Inpatient Hospital Stay (HOSPITAL_COMMUNITY)
Admission: AD | Admit: 2014-10-12 | Discharge: 2014-10-18 | DRG: 885 | Disposition: A | Discharge status: Home / Self Care | Payer: Medicare HMO | Source: Intra-hospital | Attending: Psychiatry | Admitting: Psychiatry

## 2014-10-12 ENCOUNTER — Encounter (HOSPITAL_COMMUNITY): Payer: Self-pay | Admitting: Behavioral Health

## 2014-10-12 DIAGNOSIS — Z79899 Other long term (current) drug therapy: Secondary | ICD-10-CM | POA: Diagnosis not present

## 2014-10-12 DIAGNOSIS — F3162 Bipolar disorder, current episode mixed, moderate: Secondary | ICD-10-CM | POA: Diagnosis present

## 2014-10-12 DIAGNOSIS — J45909 Unspecified asthma, uncomplicated: Secondary | ICD-10-CM | POA: Diagnosis not present

## 2014-10-12 DIAGNOSIS — E039 Hypothyroidism, unspecified: Secondary | ICD-10-CM | POA: Diagnosis not present

## 2014-10-12 DIAGNOSIS — F419 Anxiety disorder, unspecified: Secondary | ICD-10-CM | POA: Diagnosis not present

## 2014-10-12 DIAGNOSIS — F1320 Sedative, hypnotic or anxiolytic dependence, uncomplicated: Secondary | ICD-10-CM | POA: Diagnosis present

## 2014-10-12 DIAGNOSIS — F1410 Cocaine abuse, uncomplicated: Secondary | ICD-10-CM | POA: Diagnosis not present

## 2014-10-12 DIAGNOSIS — F319 Bipolar disorder, unspecified: Secondary | ICD-10-CM | POA: Diagnosis not present

## 2014-10-12 DIAGNOSIS — E034 Atrophy of thyroid (acquired): Secondary | ICD-10-CM

## 2014-10-12 DIAGNOSIS — F1114 Opioid abuse with opioid-induced mood disorder: Secondary | ICD-10-CM | POA: Diagnosis not present

## 2014-10-12 DIAGNOSIS — G43909 Migraine, unspecified, not intractable, without status migrainosus: Secondary | ICD-10-CM | POA: Diagnosis not present

## 2014-10-12 DIAGNOSIS — F3130 Bipolar disorder, current episode depressed, mild or moderate severity, unspecified: Secondary | ICD-10-CM | POA: Diagnosis not present

## 2014-10-12 DIAGNOSIS — J189 Pneumonia, unspecified organism: Secondary | ICD-10-CM | POA: Diagnosis not present

## 2014-10-12 DIAGNOSIS — F1310 Sedative, hypnotic or anxiolytic abuse, uncomplicated: Secondary | ICD-10-CM | POA: Diagnosis not present

## 2014-10-12 MED ORDER — CLONIDINE HCL 0.1 MG PO TABS
0.1000 mg | ORAL_TABLET | Freq: Every day | ORAL | Status: AC
  Administered 2014-10-17 – 2014-10-18 (×2): 0.1 mg via ORAL
  Filled 2014-10-12 (×2): qty 1

## 2014-10-12 MED ORDER — LOPERAMIDE HCL 2 MG PO CAPS: 2.0000 mg | ORAL_CAPSULE | ORAL | Status: AC | PRN

## 2014-10-12 MED ORDER — CALCIUM CARBONATE-VITAMIN D 500-200 MG-UNIT PO TABS
1.0000 | ORAL_TABLET | Freq: Every day | ORAL | Status: DC
  Administered 2014-10-12 – 2014-10-18 (×7): 1 via ORAL
  Filled 2014-10-12 (×10): qty 1

## 2014-10-12 MED ORDER — ACETAMINOPHEN 325 MG PO TABS
650.0000 mg | ORAL_TABLET | Freq: Four times a day (QID) | ORAL | Status: DC | PRN
  Administered 2014-10-12 – 2014-10-13 (×2): 650 mg via ORAL
  Filled 2014-10-12 (×2): qty 2

## 2014-10-12 MED ORDER — NAPROXEN 500 MG PO TABS
500.0000 mg | ORAL_TABLET | Freq: Two times a day (BID) | ORAL | Status: DC | PRN
  Administered 2014-10-12 – 2014-10-13 (×4): 500 mg via ORAL
  Filled 2014-10-12 (×4): qty 1

## 2014-10-12 MED ORDER — CHLORDIAZEPOXIDE HCL 25 MG PO CAPS: 25.0000 mg | ORAL_CAPSULE | ORAL | Status: DC

## 2014-10-12 MED ORDER — CHLORDIAZEPOXIDE HCL 25 MG PO CAPS
25.0000 mg | ORAL_CAPSULE | Freq: Four times a day (QID) | ORAL | Status: AC | PRN
  Administered 2014-10-13: 25 mg via ORAL
  Filled 2014-10-12: qty 1

## 2014-10-12 MED ORDER — CHLORDIAZEPOXIDE HCL 25 MG PO CAPS
25.0000 mg | ORAL_CAPSULE | Freq: Four times a day (QID) | ORAL | Status: DC
  Administered 2014-10-12 – 2014-10-13 (×3): 25 mg via ORAL
  Filled 2014-10-12 (×3): qty 1

## 2014-10-12 MED ORDER — MAGNESIUM HYDROXIDE 400 MG/5ML PO SUSP: 30.0000 mL | Freq: Every day | ORAL | Status: DC | PRN

## 2014-10-12 MED ORDER — ADULT MULTIVITAMIN W/MINERALS CH
1.0000 | ORAL_TABLET | Freq: Every day | ORAL | Status: DC
  Administered 2014-10-12 – 2014-10-18 (×6): 1 via ORAL
  Filled 2014-10-12 (×10): qty 1

## 2014-10-12 MED ORDER — LISINOPRIL 10 MG PO TABS
10.0000 mg | ORAL_TABLET | Freq: Every day | ORAL | Status: AC
  Administered 2014-10-13 – 2014-10-17 (×5): 10 mg via ORAL
  Filled 2014-10-12 (×5): qty 1

## 2014-10-12 MED ORDER — DICYCLOMINE HCL 20 MG PO TABS: 20.0000 mg | ORAL_TABLET | Freq: Four times a day (QID) | ORAL | Status: AC | PRN

## 2014-10-12 MED ORDER — VITAMIN B-1 100 MG PO TABS
100.0000 mg | ORAL_TABLET | Freq: Every day | ORAL | Status: DC
  Administered 2014-10-13 – 2014-10-18 (×6): 100 mg via ORAL
  Filled 2014-10-12 (×9): qty 1

## 2014-10-12 MED ORDER — QUETIAPINE FUMARATE 50 MG PO TABS: 50.0000 mg | ORAL_TABLET | Freq: Every day | ORAL | Status: DC

## 2014-10-12 MED ORDER — GABAPENTIN 100 MG PO CAPS
100.0000 mg | ORAL_CAPSULE | Freq: Two times a day (BID) | ORAL | Status: DC
  Administered 2014-10-12 – 2014-10-13 (×2): 100 mg via ORAL
  Filled 2014-10-12 (×6): qty 1

## 2014-10-12 MED ORDER — LOPERAMIDE HCL 2 MG PO CAPS
2.0000 mg | ORAL_CAPSULE | ORAL | Status: AC | PRN
  Filled 2014-10-12: qty 2

## 2014-10-12 MED ORDER — ONDANSETRON 4 MG PO TBDP: 4.0000 mg | ORAL_TABLET | Freq: Four times a day (QID) | ORAL | Status: DC | PRN

## 2014-10-12 MED ORDER — CHLORDIAZEPOXIDE HCL 25 MG PO CAPS: 25.0000 mg | ORAL_CAPSULE | Freq: Every day | ORAL | Status: DC

## 2014-10-12 MED ORDER — HYDROXYZINE HCL 25 MG PO TABS
25.0000 mg | ORAL_TABLET | Freq: Four times a day (QID) | ORAL | Status: AC | PRN
  Administered 2014-10-12 – 2014-10-13 (×2): 25 mg via ORAL
  Filled 2014-10-12 (×2): qty 1

## 2014-10-12 MED ORDER — ALUM & MAG HYDROXIDE-SIMETH 200-200-20 MG/5ML PO SUSP
30.0000 mL | ORAL | Status: DC | PRN
  Administered 2014-10-14 – 2014-10-18 (×5): 30 mL via ORAL
  Filled 2014-10-12 (×5): qty 30

## 2014-10-12 MED ORDER — ONE-DAILY MULTI VITAMINS PO TABS: 1.0000 | ORAL_TABLET | Freq: Every day | ORAL | Status: DC

## 2014-10-12 MED ORDER — METHOCARBAMOL 500 MG PO TABS
500.0000 mg | ORAL_TABLET | Freq: Three times a day (TID) | ORAL | Status: AC | PRN
  Administered 2014-10-12 – 2014-10-17 (×7): 500 mg via ORAL
  Filled 2014-10-12 (×8): qty 1

## 2014-10-12 MED ORDER — TIOTROPIUM BROMIDE MONOHYDRATE 18 MCG IN CAPS
18.0000 ug | ORAL_CAPSULE | Freq: Every day | RESPIRATORY_TRACT | Status: DC
  Administered 2014-10-12: 18 ug via RESPIRATORY_TRACT
  Filled 2014-10-12: qty 5

## 2014-10-12 MED ORDER — CLONIDINE HCL 0.1 MG PO TABS
0.1000 mg | ORAL_TABLET | Freq: Four times a day (QID) | ORAL | Status: AC
  Administered 2014-10-12 – 2014-10-14 (×7): 0.1 mg via ORAL
  Filled 2014-10-12 (×10): qty 1

## 2014-10-12 MED ORDER — LISINOPRIL 20 MG PO TABS
20.0000 mg | ORAL_TABLET | Freq: Once | ORAL | Status: AC
  Administered 2014-10-12: 20 mg via ORAL
  Filled 2014-10-12 (×2): qty 1

## 2014-10-12 MED ORDER — GABAPENTIN 100 MG PO CAPS
100.0000 mg | ORAL_CAPSULE | Freq: Once | ORAL | Status: AC
  Administered 2014-10-12: 100 mg via ORAL
  Filled 2014-10-12: qty 1

## 2014-10-12 MED ORDER — HYDROXYZINE HCL 25 MG PO TABS: 25.0000 mg | ORAL_TABLET | Freq: Four times a day (QID) | ORAL | Status: DC | PRN

## 2014-10-12 MED ORDER — CHLORDIAZEPOXIDE HCL 25 MG PO CAPS: 25.0000 mg | ORAL_CAPSULE | Freq: Three times a day (TID) | ORAL | Status: DC

## 2014-10-12 MED ORDER — PANTOPRAZOLE SODIUM 40 MG PO TBEC
40.0000 mg | DELAYED_RELEASE_TABLET | Freq: Every day | ORAL | Status: DC
  Administered 2014-10-12 – 2014-10-18 (×7): 40 mg via ORAL
  Filled 2014-10-12 (×10): qty 1

## 2014-10-12 MED ORDER — CLONIDINE HCL 0.1 MG PO TABS
0.1000 mg | ORAL_TABLET | ORAL | Status: AC
  Administered 2014-10-14 – 2014-10-16 (×4): 0.1 mg via ORAL
  Filled 2014-10-12 (×4): qty 1

## 2014-10-12 MED ORDER — TRAZODONE HCL 50 MG PO TABS
50.0000 mg | ORAL_TABLET | Freq: Every evening | ORAL | Status: DC | PRN
  Filled 2014-10-12: qty 1

## 2014-10-12 MED ORDER — CALCIUM CITRATE-VITAMIN D 315-200 MG-UNIT PO TABS: 1.0000 | ORAL_TABLET | Freq: Every day | ORAL | Status: DC

## 2014-10-12 MED ORDER — ONDANSETRON 4 MG PO TBDP
4.0000 mg | ORAL_TABLET | Freq: Four times a day (QID) | ORAL | Status: AC | PRN
  Administered 2014-10-12 – 2014-10-13 (×2): 4 mg via ORAL
  Filled 2014-10-12 (×2): qty 1

## 2014-10-12 NOTE — Progress Notes (Signed)
Pt was invited to attend the [place] speaker meeting, however declined to go because she stated she is "just hanging in there." Pt stated she has a lot of metal in her back, "but I've been sober from alcohol for 25 years, though!"

## 2014-10-12 NOTE — Progress Notes (Signed)
Patient ID: Victoria Levine, female   DOB: 20-Jan-1951, 64 y.o.   MRN: [id number] PER STATE REGULATIONS 482.30  THIS CHART WAS REVIEWED FOR MEDICAL NECESSITY WITH RESPECT TO THE PATIENT'S ADMISSION/DURATION OF STAY.  NEXT REVIEW DATE:10/16/14  Roma Schanz, RN, BSN CASE MANAGER

## 2014-10-12 NOTE — Progress Notes (Signed)
Admission note: Pt presents with flat affect and depressed mood. Pt hypertensive on admit. Mickel Baas NP., made aware. Pt requesting to be detox off of Oxy and Xanax. Pt stated that she's been abusing Xanax and Oxy since her last surgical procedure to her lower back four weeks ago. Pt stated on admit that she is not suicidal but suffering from depression d/t conflict with her daughter. Pt reported hx of Bipolar manic episodes. Pt requesting pain meds and xanax on admission for pain. Writer reminded pt that she is here to detox off of opiates and benzos. Pt then agreed to try an alternative med for pain. Pt reported two recent falls and difficulty walking. NP made aware and pt given a walker to ambulate with.   Pt v/s taken, belongings searched, skin assessed and required documents signed.

## 2014-10-12 NOTE — Progress Notes (Signed)
Pt ate breakfast and tolerated well. Pt stated she is feeling good today. She is currently taking a shower. Pt does contract for safety and has passive SI. Pt is concerned about her tooth pain.

## 2014-10-12 NOTE — Consult Note (Signed)
[name] Face-to-Face Psychiatry Consult   Reason for Consult:  Bipolar 1 disorder, Benzodiazepine use disorder, severe, Opioid use disorder, moderate Referring Physician:  EDP Patient Identification: Victoria Levine MRN:  [id number] Principal Diagnosis: Bipolar 1 disorder, depressed Diagnosis:   Patient Active Problem List   Diagnosis Date Noted  . Bipolar 1 disorder, depressed [F31.9] 10/12/2014    Priority: High  . Bipolar 1 disorder, mixed, moderate [F31.62] 10/12/2014  . Spondylolisthesis of lumbar region [M43.16] 07/28/2014  . Osteoporosis, unspecified [M81.0] 11/17/2013  . Migraines [G43.909]   . Herpes [B00.9]   . HYPOTHYROIDISM [E03.9] 08/13/2008  . HYPERCHOLESTEROLEMIA [E78.0] 08/13/2008  . BIPOLAR DISORDER UNSPECIFIED [F31.9] 08/13/2008  . CHRONIC OBSTRUCTIVE ASTHMA UNSPECIFIED [J44.9] 08/13/2008  . GASTROESOPHAGEAL REFLUX DISEASE [K21.9] 08/13/2008  . HIATAL HERNIA [K44.9] 08/13/2008  . ALLERGY [T78.40XA] 08/13/2008    Total Time spent with patient: 45 minutes  Subjective:   Victoria Levine is a 64 y.o. female patient admitted with Bipolar 1 disorder, Benzodiazepine use disorder, severe, Opioid use disorder, moderate  HPI: Caucasian female, 64 years old was evaluated for exacerbation of Bipolar disorder and substance abuse.  Patient reported feeling suicidal with plans to OD on her medications or shoot her self or Jump off the Bridge.  Patient reported that she has been using Opiates and Benzodiazepine since 2009 and now want to get off every one of them.   She reports that her Opiates and Benzodiazepine were prescribed for her but she has been taking more than prescribed.  She admitted to previous  Detox treatment and stated she was clean for 25 years before relapsing.  Patient reports hx of Bipolar disorder and and would like to get back on her medications.  She has been accepted for admission and has been transferred to our inpatient Psychiatric unit.  HPI Elements:    Location:  Bipolar disorder, depressed, Opioid use disorder, moderate, Benzodiazepine use disorder, moderate. Quality:  severe. Severity:  severe. Timing:  acute. Duration:  Chronic mental illness. Context:  seeking detox off Opiates, Benzos and seeking treatment for Bipolar disorder..  Past Medical History:  Past Medical History  Diagnosis Date  . Asthma   . Bipolar disorder   . Depression   . DDD (degenerative disc disease)   . Hiatal hernia   . Osteoporosis 11/2013    T score -2.6 AP spine. 1 dose Reclast with reaction Prolia since 10/2011 most recent DEXA shows significant improvement at all measured sites.  . S/P adenoidectomy   . S/P tonsillectomy   . Hypothyroid   . Migraines   . Herpes     Past Surgical History  Procedure Laterality Date  . Cesarean section    . Dilation and curettage of uterus    . Hysteroscopy    . Compression fracture spine    . Lumbar fusion    . Eye surgery Bilateral     CATARACTS REMOVED, NO LENS PLACED   Family History:  Family History  Problem Relation Age of Onset  . Adopted: Yes   Social History:  History  Alcohol Use No     History  Drug Use Not on file    History   Social History  . Marital Status: Single    Spouse Name: N/A  . Number of Children: N/A  . Years of Education: N/A   Social History Main Topics  . Smoking status: Never Smoker   . Smokeless tobacco: Not on file  . Alcohol Use: No  . Drug Use: Not on  file  . Sexual Activity: Not Currently    Birth Control/ Protection: Post-menopausal   Other Topics Concern  . None   Social History Narrative   Additional Social History:                          Allergies:   Allergies  Allergen Reactions  . Lithium Carbonate     REACTION: states INTOL to Lithium after yrs of Rx  . Prednisone     REACTION: states INTOL Pred w/ anxiety and bi polar...  . Sulfa Antibiotics Other (See Comments)    Childhood allergy    Labs:  Results for orders placed or  performed during the hospital encounter of 10/11/14 (from the past 48 hour(s))  CBC     Status: Abnormal   Collection Time: 10/11/14 11:35 AM  Result Value Ref Range   WBC 7.5 4.0 - 10.5 K/uL   RBC 4.21 3.87 - 5.11 MIL/uL   Hemoglobin 11.8 (L) 12.0 - 15.0 g/dL   HCT 36.1 36.0 - 46.0 %   MCV 85.7 78.0 - 100.0 fL   MCH 28.0 26.0 - 34.0 pg   MCHC 32.7 30.0 - 36.0 g/dL   RDW 14.3 11.5 - 15.5 %   Platelets 385 150 - 400 K/uL  Comprehensive metabolic panel     Status: Abnormal   Collection Time: 10/11/14 11:35 AM  Result Value Ref Range   Sodium 137 135 - 145 mmol/L   Potassium 3.4 (L) 3.5 - 5.1 mmol/L   Chloride 107 101 - 111 mmol/L   CO2 25 22 - 32 mmol/L   Glucose, Bld 172 (H) 65 - 99 mg/dL   BUN 15 6 - 20 mg/dL   Creatinine, Ser 0.78 0.44 - 1.00 mg/dL   Calcium 9.9 8.9 - 10.3 mg/dL   Total Protein 7.0 6.5 - 8.1 g/dL   Albumin 4.0 3.5 - 5.0 g/dL   AST 17 15 - 41 U/L   ALT 13 (L) 14 - 54 U/L   Alkaline Phosphatase 82 38 - 126 U/L   Total Bilirubin 0.4 0.3 - 1.2 mg/dL   GFR calc non Af Amer >60 >60 mL/min   GFR calc Af Amer >60 >60 mL/min    Comment: (NOTE) The eGFR has been calculated using the CKD EPI equation. This calculation has not been validated in all clinical situations. eGFR's persistently <60 mL/min signify possible Chronic Kidney Disease.    Anion gap 5 5 - 15  Ethanol     Status: None   Collection Time: 10/11/14 11:35 AM  Result Value Ref Range   Alcohol, Ethyl (B) <5 <5 mg/dL    Comment:        LOWEST DETECTABLE LIMIT FOR SERUM ALCOHOL IS 5 mg/dL FOR MEDICAL PURPOSES ONLY     Vitals: Blood pressure 179/124, pulse 98, temperature 98.5 F (36.9 C), temperature source Oral, resp. rate 20, height 5' 3.5" (1.613 m), weight 71.215 kg (157 lb).  Risk to Self: Is patient at risk for suicide?: No Risk to Others:   Prior Inpatient Therapy:   Prior Outpatient Therapy:    Current Facility-Administered Medications  Medication Dose Route Frequency Provider Last  Rate Last Dose  . acetaminophen (TYLENOL) tablet 650 mg  650 mg Oral Q6H PRN Niel Hummer, NP      . alum & mag hydroxide-simeth (MAALOX/MYLANTA) 200-200-20 MG/5ML suspension 30 mL  30 mL Oral Q4H PRN Niel Hummer, NP      .  calcium-vitamin D (OSCAL WITH D) 500-200 MG-UNIT per tablet 1 tablet  1 tablet Oral Q breakfast Nicholaus Bloom, MD   1 tablet at 10/12/14 1514  . chlordiazePOXIDE (LIBRIUM) capsule 25 mg  25 mg Oral Q6H PRN Niel Hummer, NP      . chlordiazePOXIDE (LIBRIUM) capsule 25 mg  25 mg Oral QID Niel Hummer, NP   25 mg at 10/12/14 1617   Followed by  . [START ON 10/13/2014] chlordiazePOXIDE (LIBRIUM) capsule 25 mg  25 mg Oral TID Niel Hummer, NP       Followed by  . [START ON 10/14/2014] chlordiazePOXIDE (LIBRIUM) capsule 25 mg  25 mg Oral BH-qamhs Niel Hummer, NP       Followed by  . [START ON 10/16/2014] chlordiazePOXIDE (LIBRIUM) capsule 25 mg  25 mg Oral Daily Niel Hummer, NP      . cloNIDine (CATAPRES) tablet 0.1 mg  0.1 mg Oral QID Niel Hummer, NP   0.1 mg at 10/12/14 1629   Followed by  . [START ON 10/14/2014] cloNIDine (CATAPRES) tablet 0.1 mg  0.1 mg Oral BH-qamhs Niel Hummer, NP       Followed by  . [START ON 10/17/2014] cloNIDine (CATAPRES) tablet 0.1 mg  0.1 mg Oral QAC breakfast Niel Hummer, NP      . dicyclomine (BENTYL) tablet 20 mg  20 mg Oral Q6H PRN Niel Hummer, NP      . gabapentin (NEURONTIN) capsule 100 mg  100 mg Oral BID Niel Hummer, NP   100 mg at 10/12/14 1617  . hydrOXYzine (ATARAX/VISTARIL) tablet 25 mg  25 mg Oral Q6H PRN Niel Hummer, NP      . loperamide (IMODIUM) capsule 2-4 mg  2-4 mg Oral PRN Niel Hummer, NP      . loperamide (IMODIUM) capsule 2-4 mg  2-4 mg Oral PRN Niel Hummer, NP      . magnesium hydroxide (MILK OF MAGNESIA) suspension 30 mL  30 mL Oral Daily PRN Niel Hummer, NP      . methocarbamol (ROBAXIN) tablet 500 mg  500 mg Oral Q8H PRN Niel Hummer, NP   500 mg at 10/12/14 1517  . multivitamin with minerals tablet  1 tablet  1 tablet Oral Daily Niel Hummer, NP   1 tablet at 10/12/14 1514  . naproxen (NAPROSYN) tablet 500 mg  500 mg Oral BID PRN Niel Hummer, NP   500 mg at 10/12/14 1517  . ondansetron (ZOFRAN-ODT) disintegrating tablet 4 mg  4 mg Oral Q6H PRN Niel Hummer, NP      . pantoprazole (PROTONIX) EC tablet 40 mg  40 mg Oral Daily Niel Hummer, NP   40 mg at 10/12/14 1514  . [START ON 10/13/2014] thiamine (VITAMIN B-1) tablet 100 mg  100 mg Oral Daily Niel Hummer, NP      . tiotropium [facility]) inhalation capsule 18 mcg  18 mcg Inhalation Daily Niel Hummer, NP   18 mcg at 10/12/14 1512  . traZODone (DESYREL) tablet 50 mg  50 mg Oral QHS PRN Niel Hummer, NP        Musculoskeletal: Strength & Muscle Tone: within normal limits Gait & Station: normal Patient leans: N/A  Psychiatric Specialty Exam: Physical Exam  Review of Systems  Constitutional: Negative.   HENT: Negative.   Eyes: Negative.   Respiratory:       Hx of Chronic Obstructive  Ashtma  Cardiovascular: Negative.   Gastrointestinal:       Hx of GERD, ,Hiatal Hernia  Genitourinary: Negative.   Musculoskeletal:       Spondylosis of the Lumber region  Skin: Negative.   Neurological: Negative.   Endo/Heme/Allergies: Negative.     Blood pressure 179/124, pulse 98, temperature 98.5 F (36.9 C), temperature source Oral, resp. rate 20, height 5' 3.5" (1.613 m), weight 71.215 kg (157 lb).Body mass index is 27.37 kg/(m^2).  General Appearance: Casual and Fairly Groomed  Engineer, water::  Good  Speech:  Clear and Coherent and Normal Rate  Volume:  Normal  Mood:  Anxious  Affect:  Congruent  Thought Process:  Coherent, Goal Directed and Intact  Orientation:  Full (Time, Place, and Person)  Thought Content:  WDL  Suicidal Thoughts:  No  Homicidal Thoughts:  No  Memory:  Immediate;   Good Recent;   Good Remote;   Good  Judgement:  Good  Insight:  Good  Psychomotor Activity:  Normal  Concentration:  Good  Recall:  NA   Fund of Knowledge:Good  Language: Good  Akathisia:  NA  Handed:  Right  AIMS (if indicated):     Assets:  Desire for Improvement  ADL's:  Intact  Cognition: WNL  Sleep:      Medical Decision Making: Review of Psycho-Social Stressors (1)  Treatment Plan Summary: Daily contact with patient to assess and evaluate symptoms and progress in treatment  Plan:  Resume all home medications.  We are using our Clonidine protocol for her detox from her Opioid addiction, Librium for her Benzo detox. Disposition: Admitted and transferred to inpatient unit  Delfin Gant  PMHNP-BC 10/12/2014 5:38 PM Patient seen face-to-face for psychiatric evaluation, chart reviewed and case discussed with the physician extender and developed treatment plan. Reviewed the information documented and agree with the treatment plan. Corena Pilgrim, MD

## 2014-10-12 NOTE — Tx Team (Addendum)
Initial Interdisciplinary Treatment Plan   PATIENT STRESSORS: Medication change or noncompliance Substance abuse   PATIENT STRENGTHS: Ability for insight General fund of knowledge Motivation for treatment/growth   PROBLEM LIST: Problem List/Patient Goals Date to be addressed Date deferred Reason deferred Estimated date of resolution  Opiate abuse 10/12/14     Benzo abuse 10/12/14     "Suicide Risk" 10/13/2014     "I just do not want to get into a manic states' 10/13/2014                                    DISCHARGE CRITERIA:  Ability to meet basic life and health needs Adequate post-discharge living arrangements Improved stabilization in mood, thinking, and/or behavior Withdrawal symptoms are absent or subacute and managed without 24-hour nursing intervention  PRELIMINARY DISCHARGE PLAN: Attend aftercare/continuing care group Attend PHP/IOP  PATIENT/FAMIILY INVOLVEMENT: This treatment plan has been presented to and reviewed with the patient, Victoria Levine, and/or family member  The patient and family have been given the opportunity to ask questions and make suggestions.  [name], [name] 10/12/2014, 2:11 PM

## 2014-10-12 NOTE — ED Notes (Signed)
Patient is resting comfortably. 

## 2014-10-12 NOTE — ED Notes (Addendum)
Pt stated,"I have to leave to today to get all my bills paid. " pt made aware that the MD needs to see her. Pt does contract for safety. She remains a 1:1 for her safety.pt was given zofran for nausea and tylenol for back pain along with an ice pack.

## 2014-10-12 NOTE — BH Assessment (Signed)
Seeking inpt placement. Sent referrals to the following SA/MH facilities: [facility] Griff[facility], [facility], [place], [place], [place], [place] Triage Specialist 10/12/2014 2:31 AM

## 2014-10-12 NOTE — BH Assessment (Signed)
[place] Assessment Progress Note  Per Corena Pilgrim, MD, this pt requires psychiatric hospitalization at this time.  Letitia Libra, RN, [facility] has assigned pt to [facility] Rm [id number].  Pt has signed Voluntary Admission and Consent for Treatment, as well as Consent to Release Information, and signed forms have been faxed to [facility].  Please note that pt has not granted consent for any release of information.  Pt's nurse has been notified, and agrees to send original paperwork along with pt via [place], and to call report to [phone].  Jalene Mullet, [place] Triage Specialist [phone]

## 2014-10-12 NOTE — Progress Notes (Signed)
D: Patient in her room in bed awake on approach.  Patient state she is new to the unit and states she is having xanax and opiate withdrawal symptoms.  Patient is anxious and appears to be focused on not having her xanax.  Patient states she has been taking Xanax for 20 years.  Patient states she doesn't know how how to manage without taking it but states she knows it has gotten out of hand.  Patient denies SI/HI and denies AVH.   A: Staff to monitor Q 15 mins for safety.  Encouragement and support offered.  Scheduled medications administered per orders. R: Patient remains safe on the unit.  Patient did not attend group tonight.  Patient not visible on the unit but interacting with peers.  Patient taking administered medications.

## 2014-10-12 NOTE — Progress Notes (Signed)
Recreation Therapy Notes  Animal-Assisted Activity (AAA) Program Checklist/Progress Notes Patient Eligibility Criteria Checklist & Daily Group note for Rec Tx Intervention  Date: 07.26.16 Time: 2:45 pm Location: [address]  AAA/T Program Assumption of Risk Form signed by Patient/ or Parent Legal Guardian yes  Patient is free of allergies or sever asthma yes  Patient reports no fear of animals yes  Patient reports no history of cruelty to animalsyes  Patient understands his/her participation is voluntary yes  Patient washes hands before animal contact yes  Patient washes hands after animal contact yes  Education: Hand Washing, Appropriate Animal Interaction   Education Outcome: Acknowledges understanding/In group clarification offered/Needs additional education.   Clinical Observations/Feedback:  Patient did not attend group.   Victorino Sparrow, LRT/CTRS         Victorino Sparrow A 10/12/2014 4:13 PM

## 2014-10-13 ENCOUNTER — Encounter (HOSPITAL_COMMUNITY): Payer: Self-pay | Admitting: Psychiatry

## 2014-10-13 DIAGNOSIS — F1320 Sedative, hypnotic or anxiolytic dependence, uncomplicated: Secondary | ICD-10-CM | POA: Diagnosis present

## 2014-10-13 DIAGNOSIS — F3130 Bipolar disorder, current episode depressed, mild or moderate severity, unspecified: Secondary | ICD-10-CM

## 2014-10-13 DIAGNOSIS — F1114 Opioid abuse with opioid-induced mood disorder: Secondary | ICD-10-CM | POA: Diagnosis present

## 2014-10-13 MED ORDER — QUETIAPINE FUMARATE 50 MG PO TABS
50.0000 mg | ORAL_TABLET | Freq: Every day | ORAL | Status: DC
  Administered 2014-10-13: 50 mg via ORAL
  Filled 2014-10-13 (×2): qty 1

## 2014-10-13 MED ORDER — GABAPENTIN 100 MG PO CAPS
200.0000 mg | ORAL_CAPSULE | Freq: Two times a day (BID) | ORAL | Status: DC
  Administered 2014-10-13 – 2014-10-14 (×2): 200 mg via ORAL
  Filled 2014-10-13 (×4): qty 2

## 2014-10-13 MED ORDER — CHLORDIAZEPOXIDE HCL 25 MG PO CAPS
25.0000 mg | ORAL_CAPSULE | Freq: Four times a day (QID) | ORAL | Status: DC
  Administered 2014-10-13 – 2014-10-15 (×9): 25 mg via ORAL
  Filled 2014-10-13 (×9): qty 1

## 2014-10-13 MED ORDER — OXCARBAZEPINE 300 MG PO TABS
300.0000 mg | ORAL_TABLET | Freq: Two times a day (BID) | ORAL | Status: DC
  Administered 2014-10-13 – 2014-10-18 (×10): 300 mg via ORAL
  Filled 2014-10-13 (×7): qty 1
  Filled 2014-10-13: qty 6
  Filled 2014-10-13 (×2): qty 1
  Filled 2014-10-13: qty 6
  Filled 2014-10-13 (×3): qty 1

## 2014-10-13 MED ORDER — FLUOXETINE HCL 20 MG PO CAPS
40.0000 mg | ORAL_CAPSULE | Freq: Every day | ORAL | Status: DC
  Administered 2014-10-13 – 2014-10-18 (×6): 40 mg via ORAL
  Filled 2014-10-13 (×7): qty 2
  Filled 2014-10-13: qty 6

## 2014-10-13 NOTE — BHH Suicide Risk Assessment (Signed)
[place] INPATIENT:  Family/Significant Other Suicide Prevention Education  Suicide Prevention Education:  Patient Refusal for Family/Significant Other Suicide Prevention Education: The patient Victoria Levine has refused to provide written consent for family/significant other to be provided Family/Significant Other Suicide Prevention Education during admission and/or prior to discharge.  Physician notified.  Jalilah Wiltsie, Casimiro Needle 10/13/2014, 2:37 PM

## 2014-10-13 NOTE — BHH Group Notes (Signed)
[facility] LCSW Aftercare Discharge Planning Group Note  10/13/2014  8:45 AM  Participation Quality: Did Not Attend. Patient invited to participate but declined.  Tilden Fossa, MSW, [place] Worker [facility] [phone]

## 2014-10-13 NOTE — BHH Suicide Risk Assessment (Signed)
[facility] Admission Suicide Risk Assessment   Nursing information obtained from:    Demographic factors:    Current Mental Status:    Loss Factors:    Historical Factors:    Risk Reduction Factors:    Total Time spent with patient: 45 minutes Principal Problem: Benzodiazepine dependence Diagnosis:   Patient Active Problem List   Diagnosis Date Noted  . Benzodiazepine dependence [F13.20] 10/13/2014  . Opioid abuse with opioid-induced mood disorder [F11.14] 10/13/2014  . Bipolar 1 disorder, depressed [F31.9] 10/12/2014  . Spondylolisthesis of lumbar region [M43.16] 07/28/2014  . Osteoporosis, unspecified [M81.0] 11/17/2013  . Migraines [G43.909]   . Herpes [B00.9]   . HYPOTHYROIDISM [E03.9] 08/13/2008  . HYPERCHOLESTEROLEMIA [E78.0] 08/13/2008  . BIPOLAR DISORDER UNSPECIFIED [F31.9] 08/13/2008  . CHRONIC OBSTRUCTIVE ASTHMA UNSPECIFIED [J44.9] 08/13/2008  . GASTROESOPHAGEAL REFLUX DISEASE [K21.9] 08/13/2008  . HIATAL HERNIA [K44.9] 08/13/2008  . ALLERGY [T78.40XA] 08/13/2008     Continued Clinical Symptoms:  Alcohol Use Disorder Identification Test Final Score (AUDIT): 0 The "Alcohol Use Disorders Identification Test", Guidelines for Use in Primary Care, Second Edition.  World Pharmacologist [facility]). Score between 0-7:  no or low risk or alcohol related problems. Score between 8-15:  moderate risk of alcohol related problems. Score between 16-19:  high risk of alcohol related problems. Score 20 or above:  warrants further diagnostic evaluation for alcohol dependence and treatment.   CLINICAL FACTORS:   Bipolar Disorder:   Depressive phase Alcohol/Substance Abuse/Dependencies  Psychiatric Specialty Exam: Physical Exam  ROS  Blood pressure 119/67, pulse 83, temperature 98.3 F (36.8 C), temperature source Oral, resp. rate 18, height 5' 3.5" (1.613 m), weight 71.215 kg (157 lb).Body mass index is 27.37 kg/(m^2).   COGNITIVE FEATURES THAT CONTRIBUTE TO RISK:  None     SUICIDE RISK:   Mild:  Suicidal ideation of limited frequency, intensity, duration, and specificity.  There are no identifiable plans, no associated intent, mild dysphoria and related symptoms, good self-control (both objective and subjective assessment), few other risk factors, and identifiable protective factors, including available and accessible social support.  PLAN OF CARE: Supportive approach/coping skills                              Benzodiazepine dependence; librium detox (slower taper)                              Opioid Dependence: detox protocol                             Work a relapse prevention plan                              Bipolar Depression; resume the psychotropics                              CBT/minsfulness  Medical Decision Making:  Review of Psycho-Social Stressors (1), Review or order clinical lab tests (1) and Review of Medication Regimen & Side Effects (2)  I certify that inpatient services furnished can reasonably be expected to improve the patient's condition.   Margaretha Mahan A 10/13/2014, 5:47 PM

## 2014-10-13 NOTE — BHH Group Notes (Signed)
[facility] LCSW Group Therapy 10/13/2014  1:15 PM   Type of Therapy: Group Therapy  Participation Level: Did Not Attend. Patient invited to participate but declined.   Tilden Fossa, MSW, [place] Worker [facility] [phone]

## 2014-10-13 NOTE — Progress Notes (Signed)
Patient resting in bed today. States she feels too poorly to be in the milieu. Patient anxious, irritable and worried about the future as well as shaming herself for the past. States,"I can get off the pain pills, but the blues (xanax), I don't know about that." Complaining of a headache and chronic L hip pain. Also states she is nauseated and is requesting prn. Patient medicated per orders. Zofran and naproxen given for pain. Patient offered support and reassurance during 1:1. Fall precautions reviewed and patient verbalizes understanding. On reassess, patient's pain is decreased from a 9/10 to a 7/10. Nausea improved as well. CIWA at noon is a "10" and BP trending low. Patient denies SI/HI and remains safe. Jamie Kato
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
## 2014-10-13 NOTE — Progress Notes (Signed)
Recreation Therapy Notes  Date: 07.27.16 Time: 9:30 am Location: [address] Group Room  Group Topic: Stress Management  Goal Area(s) Addresses:  Patient will verbalize importance of using healthy stress management.  Patient will identify positive emotions associated with healthy stress management.   Intervention: Stress Management  Activity :  Guided Automotive engineer.  LRT introduced the technique of guided imagery.  A script was used to deliver the technique to the patients.  Patients were asked to follow the script read a loud by LRT to engage in the technique of guided imagery.  Education:  Stress Management, Discharge Planning.   Education Outcome: Acknowledges edcuation/In group clarification offered/Needs additional education  Clinical Observations/Feedback: Patient did not attend group.   Victorino Sparrow, LRT/CTRS   Victorino Sparrow A 10/13/2014 12:11 PM

## 2014-10-13 NOTE — Progress Notes (Signed)
D: Patient in her room in bed but awake.  Patient states she had a better day today.  Patient states she was happy that she was able to talk to the doctor and states she is hopeful things will get better. Patient does appear to be anxious but she states she is able to self soothe.  Patient visited with a friend today and she states she enjoyed the visit. Patient denies SI/HI and denies AVH. A: Staff to monitor Q 15 mins for safety.  Encouragement and support offered.  Scheduled medications administered per orders. R: Patient remains safe on the unit.  Patient attended group tonight.

## 2014-10-13 NOTE — Progress Notes (Signed)
Pt was invited to attend the evening NA speaker meeting. Pt stated she was having some diarrhea and stayed in her room.

## 2014-10-13 NOTE — Progress Notes (Signed)
Patient seen walking briskly down the hallway this morning without walker.  Patient was instructed to use wlker for support however she is not using it currently.

## 2014-10-13 NOTE — Tx Team (Signed)
Initial Interdisciplinary Treatment Plan   PATIENT STRESSORS: Medication change or noncompliance Substance abuse   PATIENT STRENGTHS: Ability for insight Capable of independent living   PROBLEM LIST: Problem List/Patient Goals Date to be addressed Date deferred Reason deferred Estimated date of resolution  "Mood swings" 10/12/14     "Opiates abuse" 10/02/14                                                DISCHARGE CRITERIA:  Ability to meet basic life and health needs Adequate post-discharge living arrangements Improved stabilization in mood, thinking, and/or behavior Withdrawal symptoms are absent or subacute and managed without 24-hour nursing intervention  PRELIMINARY DISCHARGE PLAN: Attend aftercare/continuing care group Attend PHP/IOP  PATIENT/FAMIILY INVOLVEMENT: This treatment plan has been presented to and reviewed with the patient, Victoria Levine, and/or family member.  The patient and family have been given the opportunity to ask questions and make suggestions.  Victoria Levine L 10/13/2014, 7:27 AM
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
## 2014-10-13 NOTE — Plan of Care (Signed)
Problem: Ineffective individual coping Goal: STG: Patient will remain free from self harm Outcome: Progressing Patient has not engaged in self harm and denies Si  Problem: Alteration in mood & ability to function due to Goal: STG-Patient will attend groups Outcome: Not Progressing Patient states her withdrawal is too severe for her to attend groups.

## 2014-10-14 MED ORDER — CELECOXIB 100 MG PO CAPS
200.0000 mg | ORAL_CAPSULE | Freq: Two times a day (BID) | ORAL | Status: DC
  Administered 2014-10-14 – 2014-10-18 (×8): 200 mg via ORAL
  Filled 2014-10-14: qty 2
  Filled 2014-10-14 (×3): qty 1
  Filled 2014-10-14: qty 2
  Filled 2014-10-14 (×5): qty 1
  Filled 2014-10-14 (×2): qty 2
  Filled 2014-10-14 (×2): qty 1

## 2014-10-14 MED ORDER — GABAPENTIN 300 MG PO CAPS
300.0000 mg | ORAL_CAPSULE | Freq: Two times a day (BID) | ORAL | Status: DC
  Administered 2014-10-14 – 2014-10-18 (×8): 300 mg via ORAL
  Filled 2014-10-14 (×4): qty 1
  Filled 2014-10-14: qty 6
  Filled 2014-10-14 (×4): qty 1
  Filled 2014-10-14: qty 6
  Filled 2014-10-14 (×2): qty 1

## 2014-10-14 MED ORDER — QUETIAPINE FUMARATE 100 MG PO TABS
100.0000 mg | ORAL_TABLET | Freq: Every day | ORAL | Status: DC
  Administered 2014-10-14 – 2014-10-17 (×4): 100 mg via ORAL
  Filled 2014-10-14 (×2): qty 1
  Filled 2014-10-14: qty 3
  Filled 2014-10-14 (×4): qty 1

## 2014-10-14 NOTE — Progress Notes (Signed)
Patient ID: Victoria Levine, female   DOB: June 08, 1950, 64 y.o.   MRN: [id number] D: Patient in room on approach. Pt reports her day was well because her Lafe sponsor came to visit. Pt reports she is tolerating her current medication regimen well as her pain is under control and desire for opioids is minimized. Pt reports goal is to return home and be with her cat. Pt mood and affect is appropriate for situation. Pt denies SI/HI/AVH and pain. Cooperative with assessment. No acute distressed noted at this time.   A: Met with pt 1:1. Medications administered as prescribed. Support and encouragement provided to attend groups and engage in milieu. Pt encouraged to discuss feelings and come to staff with any question or concerns.   R: Patient remains safe and complaint with medications.

## 2014-10-14 NOTE — BHH Group Notes (Signed)
[facility] Group Notes:  (Nursing/MHT/Case Management/Adjunct)  Date:  10/14/2014  Time:  0930  Type of Therapy:  Nurse Education  Participation Level:  Active  Participation Quality:  Attentive  Affect:  Anxious  Cognitive:  Alert  Insight:  Improving  Engagement in Group:  Engaged  Modes of Intervention:  Discussion, Education and Support  Summary of Progress/Problems: Patient attended and participated in nurse wellness group.  Loletta Specter [facility] 10/14/2014, 0930

## 2014-10-14 NOTE — Tx Team (Signed)
Interdisciplinary Treatment Plan Update (Adult)  Date Reviewed:  10/14/2014  Time Reviewed: 11:57 AM   Progress in Treatment:   Attending groups: No, Description:  Minimal attendance due to detox  Compliant with medication administration:  Yes Denies suicidal/homicidal ideation:  Yes Discussing issues with staff:  Yes Responding to medication:  Yes Understanding diagnosis:  Yes Other:  New Problem(s) identified:  None  Discharge Plan or Barriers:   CSW to coordinate with patient and guardian prior to discharge.   Reasons for Continued Hospitalization:  Anxiety Depression Withdrawal symptoms  Comments:    Estimated Length of Stay:     Review of initial/current patient goals per problem list:   1.  Goal(s): Patient will participate in aftercare plan  Met:  No  Target date: at discharge  As evidenced by: Patient will participate within aftercare plan AEB aftercare provider and housing plan at discharge being identified.   7/28:  Patient states she is current w KimRedgland at [facility] and Robyn Bridges at [facility], patient declined to have CSW schedule follow up at this time, will check back later  2.  Goal (s): Patient will exhibit decreased depressive symptoms and suicidal ideations.  Met:  No, goal progressing  Target date: 10/18/14  As evidenced by: Patient will utilize self rating of depression at 3 or below and demonstrate decreased signs of depression or be deemed stable for discharge by MD.  10/14/14:  Patient rates depression at 5, somewhat irritable and isolative, states she does not feel well due to withdrawal  3.  Goal(s): Patient will demonstrate decreased signs and symptoms of anxiety.  Met:  No  Target date: 10/18/14  As evidenced by: Patient will utilize self rating of anxiety at 3 or below and demonstrated decreased signs of anxiety, or be deemed stable for discharge by MD  10/14/14:  Patient rates anxiety at 8, irritable.   Goal not met  4.  Goal(s): Patient will demonstrate decreased signs of withdrawal due to substance abuse  Met:  No  Target date:  10/18/14  As evidenced by: Patient will produce a CIWA/COWS score of 0, have stable vitals signs, and no symptoms of withdrawal  10/14/14:  Goal not met.  Patient reports agitation and tremors.  COWS - 5, CIWA - 4   Attendees:   Signature: I Lugo MD 10/14/2014 12:03 PM   Signature: A Cunningham, LCSW 10/14/2014 12:03 PM  Signature: Marian, RN; Kim, RN; Noelle, RN 10/14/2014 12:03 PM  Signature: J Caldwell, LCSWA 10/14/2014 12:03 PM  Signature: J Clark, RN, UR 10/14/2014 12:03 PM  Signature: V Enoch, [facility] 10/14/2014 12:03 PM  Signature:  10/14/2014 12:03 PM  Signature:  10/14/2014 12:03 PM  Signature:  10/14/2014 12:03 PM  Signature:  10/14/2014 12:03 PM  Signature:   Signature:   Signature:    Scribe for Treatment Team:   Anne Cunningham, LCSW Clinical Social Worker 10/14/2014 12:06 PM      

## 2014-10-14 NOTE — Plan of Care (Signed)
Problem: Alteration in mood & ability to function due to Goal: STG-Patient will comply with prescribed medication regimen (Patient will comply with prescribed medication regimen)  Outcome: Progressing Pt reports she is tolerating medication regime well and reports decrease pain

## 2014-10-14 NOTE — BHH Group Notes (Signed)
[facility] LCSW Group Therapy  10/14/2014 2:47 PM  Type of Therapy:  Group Therapy  Participation Level:  Did Not Attend, invited and chose not to attend  Summary of Progress/Problems:  Finding Balance in Life. Today's group focused on defining balance in one's own words, identifying things that can knock one off balance, and exploring healthy ways to maintain balance in life. Group members were asked to provide an example of a time when they felt off balance, describe how they handled that situation, and process healthier ways to regain balance in the future. Group members were asked to share the most important tool for maintaining balance that they learned while at [facility] and how they plan to apply this method after discharge.   [name] 10/14/2014, 2:47 PM

## 2014-10-14 NOTE — Plan of Care (Signed)
Problem: Alteration in mood & ability to function due to Goal: STG-Patient will report withdrawal symptoms Outcome: Progressing Patient reporting this is her worst day of withdrawal thus far. Agitated, shaky, anxious.  Problem: Diagnosis: Increased Risk For Suicide Attempt Goal: STG-Patient Will Report Suicidal Feelings to Staff Outcome: Progressing Patient denying SI

## 2014-10-14 NOTE — Progress Notes (Signed)
Met with patient in her room today. Patient tangential in speech, animated, anxious in affect as well as mood. States "this is the worst day of my withdrawal. Alcohol wasn't like this. My back also feels like it did right after surgery. It feels like fresh surgical pain." Rates her depression at a 5/10, hopelessness at a 0/10 and anxiety at an 8/10. Patient reporting she still feels too sick for group. Support and reassurance provided. Fall precautions reviewed and in place. Medicated per orders and robaxin prn given for back spasms with some relief. She verbalizes understanding regarding falls and ambulates, when up, with walker. Patient has only been up to use the phone. States she will ask doctor to prolong the librium protocol as she feels it is not long enough given her 20+ year use. She denies SI/HI and remains safe. ,  [place]  

## 2014-10-14 NOTE — Progress Notes (Signed)
[facility] MD Progress Note  10/14/2014 8:41 PM Victoria Levine  MRN:  [id number] Subjective:  Sheilah continues to have a hard time with pain. States she does not want to return to the opioids but would like an alternative to deal with the acute pain S/P surgery. She has gotten stabilized on the Librium 25 mg QID. Still endorses anxiety and worry Principal Problem: Benzodiazepine dependence Diagnosis:   Patient Active Problem List   Diagnosis Date Noted  . Benzodiazepine dependence [F13.20] 10/13/2014  . Opioid abuse with opioid-induced mood disorder [F11.14] 10/13/2014  . Bipolar 1 disorder, depressed [F31.9] 10/12/2014  . Spondylolisthesis of lumbar region [M43.16] 07/28/2014  . Osteoporosis, unspecified [M81.0] 11/17/2013  . Migraines [G43.909]   . Herpes [B00.9]   . HYPOTHYROIDISM [E03.9] 08/13/2008  . HYPERCHOLESTEROLEMIA [E78.0] 08/13/2008  . BIPOLAR DISORDER UNSPECIFIED [F31.9] 08/13/2008  . CHRONIC OBSTRUCTIVE ASTHMA UNSPECIFIED [J44.9] 08/13/2008  . GASTROESOPHAGEAL REFLUX DISEASE [K21.9] 08/13/2008  . HIATAL HERNIA [K44.9] 08/13/2008  . ALLERGY [T78.40XA] 08/13/2008   Total Time spent with patient: 30 minutes   Past Medical History:  Past Medical History  Diagnosis Date  . Asthma   . Bipolar disorder   . Depression   . DDD (degenerative disc disease)   . Hiatal hernia   . Osteoporosis 11/2013    T score -2.6 AP spine. 1 dose Reclast with reaction Prolia since 10/2011 most recent DEXA shows significant improvement at all measured sites.  . S/P adenoidectomy   . S/P tonsillectomy   . Hypothyroid   . Migraines   . Herpes     Past Surgical History  Procedure Laterality Date  . Cesarean section    . Dilation and curettage of uterus    . Hysteroscopy    . Compression fracture spine    . Lumbar fusion    . Eye surgery Bilateral     CATARACTS REMOVED, NO LENS PLACED   Family History:  Family History  Problem Relation Age of Onset  . Adopted: Yes   Social  History:  History  Alcohol Use No     History  Drug Use Not on file    History   Social History  . Marital Status: Single    Spouse Name: N/A  . Number of Children: N/A  . Years of Education: N/A   Social History Main Topics  . Smoking status: Never Smoker   . Smokeless tobacco: Not on file  . Alcohol Use: No  . Drug Use: Not on file  . Sexual Activity: Not Currently    Birth Control/ Protection: Post-menopausal   Other Topics Concern  . None   Social History Narrative   Additional History:    Sleep: Poor  Appetite:  Fair   Assessment:   Musculoskeletal: Strength & Muscle Tone: within normal limits Gait & Station: uses a walker Patient leans: Right   Psychiatric Specialty Exam: Physical Exam  Review of Systems  Constitutional: Positive for malaise/fatigue.  HENT: Negative.   Eyes: Negative.   Respiratory: Negative.   Cardiovascular: Negative.   Gastrointestinal: Negative.   Genitourinary: Negative.   Musculoskeletal: Positive for back pain.  Skin: Negative.   Neurological: Positive for weakness.  Endo/Heme/Allergies: Negative.   Psychiatric/Behavioral: Positive for depression and substance abuse. The patient is nervous/anxious.     Blood pressure 115/69, pulse 65, temperature 98.3 F (36.8 C), temperature source Oral, resp. rate 18, height 5' 3.5" (1.613 m), weight 71.215 kg (157 lb).Body mass index is 27.37 kg/(m^2).  General Appearance: Fairly Groomed  Eye Contact::  Fair  Speech:  Clear and Coherent  Volume:  Decreased  Mood:  Anxious, Depressed and worried  Affect:  anxious depressed worried  Thought Process:  Coherent and Goal Directed  Orientation:  Full (Time, Place, and Person)  Thought Content:  symptoms events worries concerns  Suicidal Thoughts:  No  Homicidal Thoughts:  No  Memory:  Immediate;   Fair Recent;   Fair Remote;   Fair  Judgement:  Fair  Insight:  Shallow  Psychomotor Activity:  Restlessness  Concentration:  Fair   Recall:  [employer] of Knowledge:Fair  Language: Fair  Akathisia:  No  Handed:  Right  AIMS (if indicated):     Assets:  Desire for Improvement Housing Social Support  ADL's:  Intact  Cognition: WNL  Sleep:  Number of Hours: 2     Current Medications: Current Facility-Administered Medications  Medication Dose Route Frequency Provider Last Rate Last Dose  . acetaminophen (TYLENOL) tablet 650 mg  650 mg Oral Q6H PRN Niel Hummer, NP   650 mg at 10/13/14 0325  . alum & mag hydroxide-simeth (MAALOX/MYLANTA) 200-200-20 MG/5ML suspension 30 mL  30 mL Oral Q4H PRN Niel Hummer, NP   30 mL at 10/14/14 0823  . calcium-vitamin D (OSCAL WITH D) 500-200 MG-UNIT per tablet 1 tablet  1 tablet Oral Q breakfast Nicholaus Bloom, MD   1 tablet at 10/14/14 [phone]  . celecoxib (CELEBREX) capsule 200 mg  200 mg Oral BID Nicholaus Bloom, MD   200 mg at 10/14/14 1656  . chlordiazePOXIDE (LIBRIUM) capsule 25 mg  25 mg Oral Q6H PRN Niel Hummer, NP   25 mg at 10/13/14 0325  . chlordiazePOXIDE (LIBRIUM) capsule 25 mg  25 mg Oral QID Nicholaus Bloom, MD   25 mg at 10/14/14 1656  . cloNIDine (CATAPRES) tablet 0.1 mg  0.1 mg Oral BH-qamhs Niel Hummer, NP       Followed by  . [START ON 10/17/2014] cloNIDine (CATAPRES) tablet 0.1 mg  0.1 mg Oral QAC breakfast Niel Hummer, NP      . dicyclomine (BENTYL) tablet 20 mg  20 mg Oral Q6H PRN Niel Hummer, NP      . FLUoxetine (PROZAC) capsule 40 mg  40 mg Oral Daily Nicholaus Bloom, MD   40 mg at 10/14/14 0819  . gabapentin (NEURONTIN) capsule 300 mg  300 mg Oral BID Nicholaus Bloom, MD   300 mg at 10/14/14 1656  . hydrOXYzine (ATARAX/VISTARIL) tablet 25 mg  25 mg Oral Q6H PRN Niel Hummer, NP   25 mg at 10/13/14 0111  . lisinopril (PRINIVIL,ZESTRIL) tablet 10 mg  10 mg Oral Daily Encarnacion Slates, NP   10 mg at 10/14/14 0820  . loperamide (IMODIUM) capsule 2-4 mg  2-4 mg Oral PRN Niel Hummer, NP      . loperamide (IMODIUM) capsule 2-4 mg  2-4 mg Oral PRN Niel Hummer, NP       . magnesium hydroxide (MILK OF MAGNESIA) suspension 30 mL  30 mL Oral Daily PRN Niel Hummer, NP      . methocarbamol (ROBAXIN) tablet 500 mg  500 mg Oral Q8H PRN Niel Hummer, NP   500 mg at 10/14/14 2876  . multivitamin with minerals tablet 1 tablet  1 tablet Oral Daily Niel Hummer, NP   1 tablet at 10/14/14 [phone]  . ondansetron (ZOFRAN-ODT) disintegrating tablet 4 mg  4  mg Oral Q6H PRN Niel Hummer, NP   4 mg at 10/13/14 0855  . Oxcarbazepine (TRILEPTAL) tablet 300 mg  300 mg Oral BID Nicholaus Bloom, MD   300 mg at 10/14/14 1656  . pantoprazole (PROTONIX) EC tablet 40 mg  40 mg Oral Daily Niel Hummer, NP   40 mg at 10/14/14 0819  . QUEtiapine (SEROQUEL) tablet 100 mg  100 mg Oral QHS Nicholaus Bloom, MD      . thiamine (VITAMIN B-1) tablet 100 mg  100 mg Oral Daily Niel Hummer, NP   100 mg at 10/14/14 0819  . tiotropium (SPIRIVA) inhalation capsule 18 mcg  18 mcg Inhalation Daily Niel Hummer, NP   18 mcg at 10/12/14 1512    Lab Results: No results found for this or any previous visit (from the past 48 hour(s)).  Physical Findings: AIMS: Facial and Oral Movements Muscles of Facial Expression: None, normal Lips and Perioral Area: None, normal Jaw: None, normal Tongue: None, normal,Extremity Movements Upper (arms, wrists, hands, fingers): None, normal Lower (legs, knees, ankles, toes): None, normal, Trunk Movements Neck, shoulders, hips: None, normal, Overall Severity Severity of abnormal movements (highest score from questions above): None, normal Incapacitation due to abnormal movements: None, normal Patient's awareness of abnormal movements (rate only patient's report): No Awareness, Dental Status Current problems with teeth and/or dentures?: No Does patient usually wear dentures?: No  CIWA:  CIWA-Ar Total: 1 COWS:  COWS Total Score: 1  Treatment Plan Summary: Daily contact with patient to assess and evaluate symptoms and progress in treatment and Medication  management Supportive approach/coping skills Benzodiazepine dependence; start weaning off the Librium Opioid dependence; clonidine detox protocol Work a relapse prevention plan Mood instability; optimize response to the trileptal Insomnia; will increase the Seroquel to 100 mg HS Use CBT/mindfulness Medical Decision Making:  Review of Psycho-Social Stressors (1), Review of Medication Regimen & Side Effects (2) and Review of New Medication or Change in Dosage (2)     Amberlynn Tempesta A 10/14/2014, 8:41 PM

## 2014-10-14 NOTE — BHH Suicide Risk Assessment (Signed)
[facility] INPATIENT:  Family/Significant Other Suicide Prevention Education  Suicide Prevention Education:  Patient Refusal for Family/Significant Other Suicide Prevention Education: The patient Victoria Levine has refused to provide written consent for family/significant other to be provided Family/Significant Other Suicide Prevention Education during admission and/or prior to discharge.  Physician notified.  Ludwig Clarks 10/14/2014, 3:43 PM

## 2014-10-15 MED ORDER — CHLORDIAZEPOXIDE HCL 5 MG PO CAPS
20.0000 mg | ORAL_CAPSULE | Freq: Three times a day (TID) | ORAL | Status: DC
  Administered 2014-10-16 – 2014-10-17 (×6): 20 mg via ORAL
  Filled 2014-10-15 (×6): qty 4

## 2014-10-15 MED ORDER — CHLORDIAZEPOXIDE HCL 5 MG PO CAPS
20.0000 mg | ORAL_CAPSULE | Freq: Four times a day (QID) | ORAL | Status: DC
  Administered 2014-10-15: 20 mg via ORAL
  Filled 2014-10-15: qty 4

## 2014-10-15 MED ORDER — CHLORDIAZEPOXIDE HCL 5 MG PO CAPS
20.0000 mg | ORAL_CAPSULE | Freq: Four times a day (QID) | ORAL | Status: AC
  Administered 2014-10-15: 20 mg via ORAL
  Filled 2014-10-15: qty 4

## 2014-10-15 NOTE — Progress Notes (Signed)
CSW offered to arrange follow up appointments with Victoria Levine and Victoria Levine who she sees as an outpatient at the [facility] however she declines. Patient  Indicates she plans to contact them to arrange her follow up as one of them is on vacation at this time.     Eduard Clos, MSW, Latanya Presser

## 2014-10-15 NOTE — Progress Notes (Signed)
[facility] Group Notes:  (Nursing/MHT/Case Management/Adjunct)  Date:  10/15/2014  Time:  2100  Type of Therapy:  wrap up group  Participation Level:  Active  Participation Quality:  Appropriate, Attentive, Sharing and Supportive  Affect:  Excited  Cognitive:  Appropriate  Insight:  Good  Engagement in Group:  Engaged  Modes of Intervention:  Clarification, Education and Support  Summary of Progress/Problems:  Victoria Levine 10/15/2014, 10:36 PM

## 2014-10-15 NOTE — BHH Group Notes (Signed)
[facility] LCSW Aftercare Discharge Planning Group Note   10/15/2014 10:04 AM  Participation Quality:    Mood/Affect:  Appropriate  Depression Rating:  3  Anxiety Rating:  4  Thoughts of Suicide:  No, declines SPE Will you contract for safety?   Negative  Current AVH:  No  Plan for Discharge/Comments:  Follow up w current providers at [facility] and [facility]; only wants records sent to her PCP  Transportation Means: family  Supports:  Family, counseling  [name]

## 2014-10-15 NOTE — Progress Notes (Signed)
Patient ID: Victoria Levine, female   DOB: Dec 09, 1950, 64 y.o.   MRN: [id number] D: Patient mood and affect appeared anxious. Pt denies feeling of hopelessness and appears upbeat about detoxing off benzo's. Pt stated tolerating medication well. Pt reports she is ready to discharge to be with her cat.  Pt denies SI/HI/AVH. Cooperative with assessment. No acute distressed noted at this time.   A: Met with pt 1:1. Medications administered as prescribed. Support and encouragement provided. Pt encouraged to discuss feelings and come to staff with any question or concerns.   R: Patient remains safe and complaint with medications.

## 2014-10-15 NOTE — Progress Notes (Signed)
Recreation Therapy Notes  Date: 07.29.16 Time: 9:30 am Location: [address] Group Room  Group Topic: Stress Management  Goal Area(s) Addresses:  Patient will verbalize importance of using healthy stress management.  Patient will identify positive emotions associated with healthy stress management.   Intervention: Stress Management  Activity :  Progressive Muscle Relaxation.  LRT introduced and educated patients on the technique of progressive muscle relaxation.  A script was used to deliver the technique to the patients.  Patients were asked to follow the script read a loud by the LRT to engage in practicing the stress management technique.  Education:  Stress Management, Discharge Planning.   Education Outcome: Acknowledges edcuation/In group clarification offered/Needs additional education  Clinical Observations/Feedback: Patient did not attend group.   Victorino Sparrow, LRT/CTRS         Victorino Sparrow A 10/15/2014 3:19 PM

## 2014-10-15 NOTE — Progress Notes (Signed)
[facility] MD Progress Note  10/15/2014 7:10 PM Victoria Levine  MRN:  [id number] Subjective:  Victoria Levine has been sedated. She states she has not experience what she was expecting from not having the opioids. She states she is encouraged. She was visited by her sponsor last night and was told that " she is back." she states she has not felt like herself in a long time. States she is ready to decrease the Librium. She does admit to some cravings for the opioids but states they dont last too long Principal Problem: Benzodiazepine dependence Diagnosis:   Patient Active Problem List   Diagnosis Date Noted  . Benzodiazepine dependence [F13.20] 10/13/2014  . Opioid abuse with opioid-induced mood disorder [F11.14] 10/13/2014  . Bipolar 1 disorder, depressed [F31.9] 10/12/2014  . Spondylolisthesis of lumbar region [M43.16] 07/28/2014  . Osteoporosis, unspecified [M81.0] 11/17/2013  . Migraines [G43.909]   . Herpes [B00.9]   . HYPOTHYROIDISM [E03.9] 08/13/2008  . HYPERCHOLESTEROLEMIA [E78.0] 08/13/2008  . BIPOLAR DISORDER UNSPECIFIED [F31.9] 08/13/2008  . CHRONIC OBSTRUCTIVE ASTHMA UNSPECIFIED [J44.9] 08/13/2008  . GASTROESOPHAGEAL REFLUX DISEASE [K21.9] 08/13/2008  . HIATAL HERNIA [K44.9] 08/13/2008  . ALLERGY [T78.40XA] 08/13/2008   Total Time spent with patient: 30 minutes   Past Medical History:  Past Medical History  Diagnosis Date  . Asthma   . Bipolar disorder   . Depression   . DDD (degenerative disc disease)   . Hiatal hernia   . Osteoporosis 11/2013    T score -2.6 AP spine. 1 dose Reclast with reaction Prolia since 10/2011 most recent DEXA shows significant improvement at all measured sites.  . S/P adenoidectomy   . S/P tonsillectomy   . Hypothyroid   . Migraines   . Herpes     Past Surgical History  Procedure Laterality Date  . Cesarean section    . Dilation and curettage of uterus    . Hysteroscopy    . Compression fracture spine    . Lumbar fusion    . Eye surgery  Bilateral     CATARACTS REMOVED, NO LENS PLACED   Family History:  Family History  Problem Relation Age of Onset  . Adopted: Yes   Social History:  History  Alcohol Use No     History  Drug Use Not on file    History   Social History  . Marital Status: Single    Spouse Name: N/A  . Number of Children: N/A  . Years of Education: N/A   Social History Main Topics  . Smoking status: Never Smoker   . Smokeless tobacco: Not on file  . Alcohol Use: No  . Drug Use: Not on file  . Sexual Activity: Not Currently    Birth Control/ Protection: Post-menopausal   Other Topics Concern  . None   Social History Narrative   Additional History:    Sleep: Fair  Appetite:  Fair   Assessment:   Musculoskeletal: Strength & Muscle Tone: within normal limits Gait & Station: affected by back pain S/P back surgery Patient leans: Front   Psychiatric Specialty Exam: Physical Exam  Review of Systems  Constitutional: Negative.   HENT: Negative.   Eyes: Negative.   Respiratory: Negative.   Cardiovascular: Negative.   Gastrointestinal: Negative.   Genitourinary: Negative.   Musculoskeletal: Positive for back pain.  Skin: Negative.   Neurological: Negative.   Endo/Heme/Allergies: Negative.   Psychiatric/Behavioral: Positive for substance abuse. The patient is nervous/anxious.     Blood pressure 121/81, pulse 61, temperature 98.7  F (37.1 C), temperature source Oral, resp. rate 18, height 5' 3.5" (1.613 m), weight 71.215 kg (157 lb).Body mass index is 27.37 kg/(m^2).  General Appearance: Fairly Groomed  Engineer, water::  Fair  Speech:  Clear and Coherent  Volume:  Normal  Mood:  Euthymic  Affect:  Appropriate  Thought Process:  Coherent and Goal Directed  Orientation:  Full (Time, Place, and Person)  Thought Content:  symptoms events worries concerns  Suicidal Thoughts:  No  Homicidal Thoughts:  No  Memory:  Immediate;   Fair Recent;   Fair Remote;   Fair  Judgement:   Fair  Insight:  Present  Psychomotor Activity:  Normal  Concentration:  Fair  Recall:  [employer] [place]  Language: Fair  Akathisia:  No  Handed:  Right  AIMS (if indicated):     Assets:  Desire for Improvement Housing Social Support  ADL's:  Intact  Cognition: WNL  Sleep:  Number of Hours: 6.75     Current Medications: Current Facility-Administered Medications  Medication Dose Route Frequency Provider Last Rate Last Dose  . acetaminophen (TYLENOL) tablet 650 mg  650 mg Oral Q6H PRN Niel Hummer, NP   650 mg at 10/13/14 0325  . alum & mag hydroxide-simeth (MAALOX/MYLANTA) 200-200-20 MG/5ML suspension 30 mL  30 mL Oral Q4H PRN Niel Hummer, NP   30 mL at 10/15/14 0821  . calcium-vitamin D (OSCAL WITH D) 500-200 MG-UNIT per tablet 1 tablet  1 tablet Oral Q breakfast Nicholaus Bloom, MD   1 tablet at 10/15/14 0818  . celecoxib (CELEBREX) capsule 200 mg  200 mg Oral BID Nicholaus Bloom, MD   200 mg at 10/15/14 1706  . chlordiazePOXIDE (LIBRIUM) capsule 20 mg  20 mg Oral QID Nicholaus Bloom, MD   20 mg at 10/15/14 1707  . cloNIDine (CATAPRES) tablet 0.1 mg  0.1 mg Oral BH-qamhs Niel Hummer, NP   0.1 mg at 10/15/14 1610   Followed by  . [START ON 10/17/2014] cloNIDine (CATAPRES) tablet 0.1 mg  0.1 mg Oral QAC breakfast Niel Hummer, NP      . dicyclomine (BENTYL) tablet 20 mg  20 mg Oral Q6H PRN Niel Hummer, NP      . FLUoxetine (PROZAC) capsule 40 mg  40 mg Oral Daily Nicholaus Bloom, MD   40 mg at 10/15/14 0820  . gabapentin (NEURONTIN) capsule 300 mg  300 mg Oral BID Nicholaus Bloom, MD   300 mg at 10/15/14 1706  . lisinopril (PRINIVIL,ZESTRIL) tablet 10 mg  10 mg Oral Daily Encarnacion Slates, NP   10 mg at 10/15/14 0820  . loperamide (IMODIUM) capsule 2-4 mg  2-4 mg Oral PRN Niel Hummer, NP      . magnesium hydroxide (MILK OF MAGNESIA) suspension 30 mL  30 mL Oral Daily PRN Niel Hummer, NP      . methocarbamol (ROBAXIN) tablet 500 mg  500 mg Oral Q8H PRN Niel Hummer, NP    500 mg at 10/15/14 0827  . multivitamin with minerals tablet 1 tablet  1 tablet Oral Daily Niel Hummer, NP   1 tablet at 10/15/14 [phone]  . Oxcarbazepine (TRILEPTAL) tablet 300 mg  300 mg Oral BID Nicholaus Bloom, MD   300 mg at 10/15/14 1706  . pantoprazole (PROTONIX) EC tablet 40 mg  40 mg Oral Daily Niel Hummer, NP   40 mg at 10/15/14 0818  . QUEtiapine (  SEROQUEL) tablet 100 mg  100 mg Oral QHS Nicholaus Bloom, MD   100 mg at 10/14/14 2203  . thiamine (VITAMIN B-1) tablet 100 mg  100 mg Oral Daily Niel Hummer, NP   100 mg at 10/15/14 0819  . tiotropium (SPIRIVA) inhalation capsule 18 mcg  18 mcg Inhalation Daily Niel Hummer, NP   18 mcg at 10/12/14 1512    Lab Results: No results found for this or any previous visit (from the past 48 hour(s)).  Physical Findings: AIMS: Facial and Oral Movements Muscles of Facial Expression: None, normal Lips and Perioral Area: None, normal Jaw: None, normal Tongue: None, normal,Extremity Movements Upper (arms, wrists, hands, fingers): None, normal Lower (legs, knees, ankles, toes): None, normal, Trunk Movements Neck, shoulders, hips: None, normal, Overall Severity Severity of abnormal movements (highest score from questions above): None, normal Incapacitation due to abnormal movements: None, normal Patient's awareness of abnormal movements (rate only patient's report): No Awareness, Dental Status Current problems with teeth and/or dentures?: No Does patient usually wear dentures?: No  CIWA:  CIWA-Ar Total: 1 COWS:  COWS Total Score: 1  Treatment Plan Summary: Daily contact with patient to assess and evaluate symptoms and progress in treatment and Medication management Supportive approach/coping skills Opioid dependence; continue the detox protocol Work a relapse prevention plan Bipolar Disorder; continue the Trileptal 300 mg BID Benzodiazepine withdrawal: continue the librium taper: Librium 20 mg TID starting tomorrow Continue the Neurontin  300 mg Insomnia/mood instability; continue the Seroquel 100 mg HS Work with CBT/mindfulness Medical Decision Making:  Review of Psycho-Social Stressors (1) and Review of Medication Regimen & Side Effects (2)     Rogelio Waynick A 10/15/2014, 7:10 PM

## 2014-10-15 NOTE — [facility] Group Notes (Signed)
[name]  10/15/2014 2:25 PM  Type of Therapy:  Group Therapy  Participation Level:  Active  Participation Quality:  Attentive  Affect:  Appropriate  Cognitive:  Alert and Appropriate  Insight:  Engaged  Engagement in Therapy:  Engaged  Modes of Intervention:  Discussion, Exploration and Problem-solving  Summary of Progress/Problems:  Summary of Progress/Problems: The topic for today was feelings about relapse. Pt discussed what relapse prevention is to them and identified triggers that they are on the path to relapse. Pt processed their feeling towards relapse and was able to relate to peers. Pt discussed coping skills that can be used for relapse prevention. Patient shared her experience w early withdrawal w group, encouraged using tools of recovery and making choices that support recovery.   [name] 10/15/2014, 2:25 PM

## 2014-10-16 MED ORDER — LEVOTHYROXINE SODIUM 125 MCG PO TABS
125.0000 ug | ORAL_TABLET | Freq: Every day | ORAL | Status: DC
  Administered 2014-10-17 – 2014-10-18 (×2): 125 ug via ORAL
  Filled 2014-10-16 (×5): qty 1

## 2014-10-16 MED ORDER — BECLOMETHASONE DIPROPIONATE 80 MCG/ACT IN AERS
1.0000 | INHALATION_SPRAY | RESPIRATORY_TRACT | Status: DC | PRN
  Administered 2014-10-17: 1 via RESPIRATORY_TRACT
  Filled 2014-10-16: qty 8.7

## 2014-10-16 NOTE — Progress Notes (Signed)
Patient ID: Victoria Levine, female   DOB: 1950/11/26, 64 y.o.   MRN: [id number]  Adult Psychoeducational Group Note  Date:  10/16/2014 Time: 01:00pm   Group Topic/Focus:  Self Care:   The focus of this group is to help patients understand the importance of self-care in order to improve or restore emotional, physical, spiritual, interpersonal, and financial health.  Participation Level:  Active  Participation Quality:  Appropriate and Attentive  Affect:  Flat  Cognitive:  Alert and Oriented  Insight: Improving  Engagement in Group:  Engaged and Improving  Modes of Intervention:  Activity, Discussion, Education and Support  Additional Comments:  Pt was able to identify a personal strength of spiritual self care today in group. Pt did not wish to share her plans post discharge. Pt was supportive of others and showed concerned for her peers during discussion.   Victoria Levine 10/16/2014, 3:38 PM

## 2014-10-16 NOTE — Plan of Care (Signed)
Problem: Diagnosis: Increased Risk For Suicide Attempt Goal: STG-Patient Will Attend All Groups On The Unit Outcome: Progressing Pt attended and participated in evening karaoke group.

## 2014-10-16 NOTE — Progress Notes (Addendum)
Patient ID: Victoria Levine, female   DOB: [birth year], 64 y.o.   MRN: [id number] [facility] MD Progress Note  10/16/2014 4:42 PM Victoria Levine  MRN:  [id number]  Subjective:  Victoria Levine says she is feeling better. Says her substance withdrawal symptoms are getting tolerable. However, she says she is stuck inside the hospital, unable to go outside because she does not have her QVAR inhalers, the only inhaler she says helps her shortness of breath. She also asked for her Synthroid 125 mcg to be reinstated as her thyroid gland is very low functioning from taking Lithium in the past. She says her mood is good. Denies any SIHI, AVH, delusional thoughts or paranoia. She walks with a cane to aid/support her mobility within the unit.  Principal Problem: Benzodiazepine dependence  Diagnosis:   Patient Active Problem List   Diagnosis Date Noted  . Benzodiazepine dependence [F13.20] 10/13/2014  . Opioid abuse with opioid-induced mood disorder [F11.14] 10/13/2014  . Bipolar 1 disorder, depressed [F31.9] 10/12/2014  . Spondylolisthesis of lumbar region [M43.16] 07/28/2014  . Osteoporosis, unspecified [M81.0] 11/17/2013  . Migraines [G43.909]   . Herpes [B00.9]   . HYPOTHYROIDISM [E03.9] 08/13/2008  . HYPERCHOLESTEROLEMIA [E78.0] 08/13/2008  . BIPOLAR DISORDER UNSPECIFIED [F31.9] 08/13/2008  . CHRONIC OBSTRUCTIVE ASTHMA UNSPECIFIED [J44.9] 08/13/2008  . GASTROESOPHAGEAL REFLUX DISEASE [K21.9] 08/13/2008  . HIATAL HERNIA [K44.9] 08/13/2008  . ALLERGY [T78.40XA] 08/13/2008   Total Time spent with patient: 20 minutes   Past Medical History:  Past Medical History  Diagnosis Date  . Asthma   . Bipolar disorder   . Depression   . DDD (degenerative disc disease)   . Hiatal hernia   . Osteoporosis 11/2013    T score -2.6 AP spine. 1 dose Reclast with reaction Prolia since 10/2011 most recent DEXA shows significant improvement at all measured sites.  . S/P adenoidectomy   . S/P tonsillectomy   .  Hypothyroid   . Migraines   . Herpes     Past Surgical History  Procedure Laterality Date  . Cesarean section    . Dilation and curettage of uterus    . Hysteroscopy    . Compression fracture spine    . Lumbar fusion    . Eye surgery Bilateral     CATARACTS REMOVED, NO LENS PLACED   Family History:  Family History  Problem Relation Age of Onset  . Adopted: Yes   Social History:  History  Alcohol Use No     History  Drug Use Not on file    History   Social History  . Marital Status: Single    Spouse Name: N/A  . Number of Children: N/A  . Years of Education: N/A   Social History Main Topics  . Smoking status: Never Smoker   . Smokeless tobacco: Not on file  . Alcohol Use: No  . Drug Use: Not on file  . Sexual Activity: Not Currently    Birth Control/ Protection: Post-menopausal   Other Topics Concern  . None   Social History Narrative   Additional History:    Sleep: Fair  Appetite:  Fair  Musculoskeletal: Strength & Muscle Tone: within normal limits Gait & Station: affected by back pain S/P back surgery Patient leans: Front  Psychiatric Specialty Exam: Physical Exam  Review of Systems  Constitutional: Negative.   HENT: Negative.   Eyes: Negative.   Respiratory: Negative.   Cardiovascular: Negative.   Gastrointestinal: Negative.   Genitourinary: Negative.   Musculoskeletal: Positive for back  pain.  Skin: Negative.   Neurological: Negative.   Endo/Heme/Allergies: Negative.   Psychiatric/Behavioral: Positive for substance abuse. The patient is nervous/anxious.     Blood pressure 108/71, pulse 51, temperature 98.5 F (36.9 C), temperature source Oral, resp. rate 16, height 5' 3.5" (1.613 m), weight 71.215 kg (157 lb).Body mass index is 27.37 kg/(m^2).  General Appearance: Fairly Groomed  Engineer, water::  Fair  Speech:  Clear and Coherent  Volume:  Normal  Mood:  Euthymic  Affect:  Appropriate  Thought Process:  Coherent and Goal Directed   Orientation:  Full (Time, Place, and Person)  Thought Content:  symptoms events worries concerns  Suicidal Thoughts:  No  Homicidal Thoughts:  No  Memory:  Immediate;   Fair Recent;   Fair Remote;   Fair  Judgement:  Fair  Insight:  Present  Psychomotor Activity:  Normal  Concentration:  Fair  Recall:  [employer] [place]  Language: Fair  Akathisia:  No  Handed:  Right  AIMS (if indicated):     Assets:  Desire for Improvement Housing Social Support  ADL's:  Intact  Cognition: WNL  Sleep:  Number of Hours: 6.75   Current Medications: Current Facility-Administered Medications  Medication Dose Route Frequency Provider Last Rate Last Dose  . acetaminophen (TYLENOL) tablet 650 mg  650 mg Oral Q6H PRN Niel Hummer, NP   650 mg at 10/13/14 0325  . alum & mag hydroxide-simeth (MAALOX/MYLANTA) 200-200-20 MG/5ML suspension 30 mL  30 mL Oral Q4H PRN Niel Hummer, NP   30 mL at 10/16/14 0829  . beclomethasone (QVAR) 80 MCG/ACT inhaler 1 puff  1 puff Inhalation PRN Encarnacion Slates, NP      . calcium-vitamin D (OSCAL WITH D) 500-200 MG-UNIT per tablet 1 tablet  1 tablet Oral Q breakfast Nicholaus Bloom, MD   1 tablet at 10/16/14 0830  . celecoxib (CELEBREX) capsule 200 mg  200 mg Oral BID Nicholaus Bloom, MD   200 mg at 10/16/14 1604  . chlordiazePOXIDE (LIBRIUM) capsule 20 mg  20 mg Oral TID Nicholaus Bloom, MD   20 mg at 10/16/14 1603  . [START ON 10/17/2014] cloNIDine (CATAPRES) tablet 0.1 mg  0.1 mg Oral QAC breakfast Niel Hummer, NP      . dicyclomine (BENTYL) tablet 20 mg  20 mg Oral Q6H PRN Niel Hummer, NP      . FLUoxetine (PROZAC) capsule 40 mg  40 mg Oral Daily Nicholaus Bloom, MD   40 mg at 10/16/14 0830  . gabapentin (NEURONTIN) capsule 300 mg  300 mg Oral BID Nicholaus Bloom, MD   300 mg at 10/16/14 1604  . [START ON 10/17/2014] levothyroxine (SYNTHROID, LEVOTHROID) tablet 125 mcg  125 mcg Oral QAC breakfast Encarnacion Slates, NP      . lisinopril (PRINIVIL,ZESTRIL) tablet 10 mg   10 mg Oral Daily Encarnacion Slates, NP   10 mg at 10/16/14 3235  . loperamide (IMODIUM) capsule 2-4 mg  2-4 mg Oral PRN Niel Hummer, NP      . magnesium hydroxide (MILK OF MAGNESIA) suspension 30 mL  30 mL Oral Daily PRN Niel Hummer, NP      . methocarbamol (ROBAXIN) tablet 500 mg  500 mg Oral Q8H PRN Niel Hummer, NP   500 mg at 10/16/14 0948  . multivitamin with minerals tablet 1 tablet  1 tablet Oral Daily Niel Hummer, NP   1 tablet at  10/15/14 0819  . Oxcarbazepine (TRILEPTAL) tablet 300 mg  300 mg Oral BID Nicholaus Bloom, MD   300 mg at 10/16/14 1604  . pantoprazole (PROTONIX) EC tablet 40 mg  40 mg Oral Daily Niel Hummer, NP   40 mg at 10/16/14 0830  . QUEtiapine (SEROQUEL) tablet 100 mg  100 mg Oral QHS Nicholaus Bloom, MD   100 mg at 10/15/14 2135  . thiamine (VITAMIN B-1) tablet 100 mg  100 mg Oral Daily Niel Hummer, NP   100 mg at 10/16/14 0829  . tiotropium (SPIRIVA) inhalation capsule 18 mcg  18 mcg Inhalation Daily Niel Hummer, NP   18 mcg at 10/12/14 1512    Lab Results: No results found for this or any previous visit (from the past 48 hour(s)).  Physical Findings: AIMS: Facial and Oral Movements Muscles of Facial Expression: None, normal Lips and Perioral Area: None, normal Jaw: None, normal Tongue: None, normal,Extremity Movements Upper (arms, wrists, hands, fingers): None, normal Lower (legs, knees, ankles, toes): None, normal, Trunk Movements Neck, shoulders, hips: None, normal, Overall Severity Severity of abnormal movements (highest score from questions above): None, normal Incapacitation due to abnormal movements: None, normal Patient's awareness of abnormal movements (rate only patient's report): No Awareness, Dental Status Current problems with teeth and/or dentures?: No Does patient usually wear dentures?: No  CIWA:  CIWA-Ar Total: 1 COWS:  COWS Total Score: 7  Treatment Plan Summary: Daily contact with patient to assess and evaluate symptoms and progress  in treatment and Medication management Supportive approach/coping skills/relapse prevention. Opioid dependence; continue the detox protocols Work a relapse prevention plan. Bipolar Disorder; continue the Trileptal 300 mg BID for mood stabilization. Benzodiazepine withdrawal: continue the librium taper: Librium 20 mg TID starting tomorrow Continue the Neurontin 300 mg for substance withdrawal syndrome/agitation Insomnia/mood instability; continue the Seroquel 100 mg HS Work with CBT/mindfulness. Resumed Quvar for COPD & Synthroid 125 mcg for hypothyroidism.  Medical Decision Making:  Established Problem, Stable/Improving (1), Review of Psycho-Social Stressors (1), Review of Medication Regimen & Side Effects (2) and Review of New Medication or Change in Dosage (2)  Encarnacion Slates, PMHNP-BC 10/16/2014, 4:42 PM  I reviewed chart and agreed with the findings and treatment Plan.  Berniece Andreas, MD

## 2014-10-16 NOTE — Progress Notes (Signed)
D: Patient is ambulating without her walker.  She is steady on her feet.  She reports decreased depressive symptoms, rating her depression as a 0.  She denies any hopelessness and rates her anxiety as a 3.  She is sleeping and eating well.  She denies any withdrawal symptoms.  She is pleasant and cooperative.  She denies SI/HI/AVH.  She has requested an inhaler for her asthma. A: Continue to monitor medication management and MD orders.  Safety checks completed every 15 minutes per protocol.  Offer support and encouragement. R: Patient is receptive to staff.

## 2014-10-16 NOTE — BHH Group Notes (Signed)
[place] Group Notes: (Clinical Social Work)   10/16/2014      Type of Therapy:  Group Therapy   Participation Level:  Did Not Attend despite MHT prompting   Selmer Dominion, LCSW 10/16/2014, 12:54 PM

## 2014-10-16 NOTE — Progress Notes (Signed)
[facility] Group Notes:  (Nursing/MHT/Case Management/Adjunct)  Date:  10/16/2014  Time: 2100  Type of Therapy:  wrap up group  Participation Level:  Active  Participation Quality:  Appropriate, Attentive, Sharing and Supportive  Affect:  Appropriate and Excited  Cognitive:  Appropriate  Insight:  Good  Engagement in Group:  Engaged  Modes of Intervention:  Clarification, Education and Support  Summary of Progress/Problems:  Victoria Levine 10/16/2014, 10:15 PM

## 2014-10-16 NOTE — BHH Group Notes (Signed)
[place] Group Notes:  (Nursing/MHT/Case Management/Adjunct)  Date:  10/16/2014  Time:  10:10 AM  Type of Therapy:  Psychoeducational Skills  Participation Level:  Active  Participation Quality:  Appropriate  Affect:  Appropriate  Cognitive:  Appropriate  Insight:  Appropriate  Engagement in Group:  Engaged  Modes of Intervention:  Discussion  Summary of Progress/Problems: Pt did attend self inventory group.  Benancio Deeds Shanta 10/16/2014, 10:10 AM

## 2014-10-17 MED ORDER — CHLORDIAZEPOXIDE HCL 5 MG PO CAPS
15.0000 mg | ORAL_CAPSULE | Freq: Three times a day (TID) | ORAL | Status: DC
  Administered 2014-10-18: 15 mg via ORAL
  Filled 2014-10-17: qty 3

## 2014-10-17 NOTE — Progress Notes (Signed)
Patient ID: Victoria Levine, female   DOB: December 06, 1950, 64 y.o.   MRN: [id number] D: Patient  appeared pleasant reports she is doing well. Pt  stated tolerating medication well. Pt reports she is ready to discharge to be with her cat.  Pt denies SI/HI/AVH. Cooperative with assessment. No acute distressed noted at this time.   A: Met with pt 1:1. Medications administered as prescribed. Support and encouragement provided. Pt encouraged to discuss feelings and come to staff with any question or concerns.   R: Patient remains safe and complaint with medications.

## 2014-10-17 NOTE — BHH Group Notes (Signed)
[place] Group Notes: (Clinical Social Work)   10/17/2014      Type of Therapy:  Group Therapy   Participation Level:  Did Not Attend despite MHT and CSW prompting - irritable, seen in hall, said was "too sleepy" but kept coming into hall.   Selmer Dominion, LCSW 10/17/2014, 12:40 PM

## 2014-10-17 NOTE — Progress Notes (Signed)
Patient ID: [name], female   DOB: [birth year], 64 y.o.   MRN: [id number] Patient ID: KIANAH HARRIES, female   DOB: Jul 23, 1950, 64 y.o.   MRN: [id number] [facility] MD Progress Note  10/17/2014 4:46 PM NARCISA GANESH  MRN:  [id number]  Subjective:  Victoria Levine says she is feeling good today, just a little wobbly. She says the cheap food here is making her gain weight. She says she is ready to be discharged in am for daughter's birth is tomorrow. She currently denies any new issues or concerns.  Principal Problem: Benzodiazepine dependence  Diagnosis:   Patient Active Problem List   Diagnosis Date Noted  . Benzodiazepine dependence [F13.20] 10/13/2014  . Opioid abuse with opioid-induced mood disorder [F11.14] 10/13/2014  . Bipolar 1 disorder, depressed [F31.9] 10/12/2014  . Spondylolisthesis of lumbar region [M43.16] 07/28/2014  . Osteoporosis, unspecified [M81.0] 11/17/2013  . Migraines [G43.909]   . Herpes [B00.9]   . HYPOTHYROIDISM [E03.9] 08/13/2008  . HYPERCHOLESTEROLEMIA [E78.0] 08/13/2008  . BIPOLAR DISORDER UNSPECIFIED [F31.9] 08/13/2008  . CHRONIC OBSTRUCTIVE ASTHMA UNSPECIFIED [J44.9] 08/13/2008  . GASTROESOPHAGEAL REFLUX DISEASE [K21.9] 08/13/2008  . HIATAL HERNIA [K44.9] 08/13/2008  . ALLERGY [T78.40XA] 08/13/2008   Total Time spent with patient: 20 minutes   Past Medical History:  Past Medical History  Diagnosis Date  . Asthma   . Bipolar disorder   . Depression   . DDD (degenerative disc disease)   . Hiatal hernia   . Osteoporosis 11/2013    T score -2.6 AP spine. 1 dose Reclast with reaction Prolia since 10/2011 most recent DEXA shows significant improvement at all measured sites.  . S/P adenoidectomy   . S/P tonsillectomy   . Hypothyroid   . Migraines   . Herpes     Past Surgical History  Procedure Laterality Date  . Cesarean section    . Dilation and curettage of uterus    . Hysteroscopy    . Compression fracture spine    . Lumbar fusion    .  Eye surgery Bilateral     CATARACTS REMOVED, NO LENS PLACED   Family History:  Family History  Problem Relation Age of Onset  . Adopted: Yes   Social History:  History  Alcohol Use No     History  Drug Use Not on file    History   Social History  . Marital Status: Single    Spouse Name: N/A  . Number of Children: N/A  . Years of Education: N/A   Social History Main Topics  . Smoking status: Never Smoker   . Smokeless tobacco: Not on file  . Alcohol Use: No  . Drug Use: Not on file  . Sexual Activity: Not Currently    Birth Control/ Protection: Post-menopausal   Other Topics Concern  . None   Social History Narrative   Additional History:    Sleep: Fair  Appetite:  Fair  Musculoskeletal: Strength & Muscle Tone: within normal limits Gait & Station: affected by back pain S/P back surgery Patient leans: Front  Psychiatric Specialty Exam: Physical Exam  Review of Systems  Constitutional: Negative.   HENT: Negative.   Eyes: Negative.   Respiratory: Negative.   Cardiovascular: Negative.   Gastrointestinal: Negative.   Genitourinary: Negative.   Musculoskeletal: Positive for back pain.  Skin: Negative.   Neurological: Negative.   Endo/Heme/Allergies: Negative.   Psychiatric/Behavioral: Positive for substance abuse. The patient is nervous/anxious.     Blood pressure 127/75, pulse 82, temperature 98.4 F (  36.9 C), temperature source Oral, resp. rate 16, height 5' 3.5" (1.613 m), weight 71.215 kg (157 lb).Body mass index is 27.37 kg/(m^2).  General Appearance: Fairly Groomed  Engineer, water::  Fair  Speech:  Clear and Coherent  Volume:  Normal  Mood:  Euthymic  Affect:  Appropriate  Thought Process:  Coherent and Goal Directed  Orientation:  Full (Time, Place, and Person)  Thought Content:  symptoms events worries concerns  Suicidal Thoughts:  No  Homicidal Thoughts:  No  Memory:  Immediate;   Fair Recent;   Fair Remote;   Fair  Judgement:  Fair   Insight:  Present  Psychomotor Activity:  Normal  Concentration:  Fair  Recall:  [employer] [place]  Language: Fair  Akathisia:  No  Handed:  Right  AIMS (if indicated):     Assets:  Desire for Improvement Housing Social Support  ADL's:  Intact  Cognition: WNL  Sleep:  Number of Hours: 6.75   Current Medications: Current Facility-Administered Medications  Medication Dose Route Frequency Provider Last Rate Last Dose  . acetaminophen (TYLENOL) tablet 650 mg  650 mg Oral Q6H PRN Niel Hummer, NP   650 mg at 10/13/14 0325  . alum & mag hydroxide-simeth (MAALOX/MYLANTA) 200-200-20 MG/5ML suspension 30 mL  30 mL Oral Q4H PRN Niel Hummer, NP   30 mL at 10/17/14 0811  . beclomethasone (QVAR) 80 MCG/ACT inhaler 1 puff  1 puff Inhalation PRN Encarnacion Slates, NP   1 puff at 10/17/14 [phone]  . calcium-vitamin D (OSCAL WITH D) 500-200 MG-UNIT per tablet 1 tablet  1 tablet Oral Q breakfast Nicholaus Bloom, MD   1 tablet at 10/17/14 [phone]  . celecoxib (CELEBREX) capsule 200 mg  200 mg Oral BID Nicholaus Bloom, MD   200 mg at 10/17/14 6578  . chlordiazePOXIDE (LIBRIUM) capsule 20 mg  20 mg Oral TID Nicholaus Bloom, MD   20 mg at 10/17/14 1404  . cloNIDine (CATAPRES) tablet 0.1 mg  0.1 mg Oral QAC breakfast Niel Hummer, NP   0.1 mg at 10/17/14 0630  . FLUoxetine (PROZAC) capsule 40 mg  40 mg Oral Daily Nicholaus Bloom, MD   40 mg at 10/17/14 4696  . gabapentin (NEURONTIN) capsule 300 mg  300 mg Oral BID Nicholaus Bloom, MD   300 mg at 10/17/14 2952  . levothyroxine (SYNTHROID, LEVOTHROID) tablet 125 mcg  125 mcg Oral QAC breakfast Encarnacion Slates, NP   125 mcg at 10/17/14 0630  . magnesium hydroxide (MILK OF MAGNESIA) suspension 30 mL  30 mL Oral Daily PRN Niel Hummer, NP      . multivitamin with minerals tablet 1 tablet  1 tablet Oral Daily Niel Hummer, NP   1 tablet at 10/17/14 [phone]  . Oxcarbazepine (TRILEPTAL) tablet 300 mg  300 mg Oral BID Nicholaus Bloom, MD   300 mg at 10/17/14 2440  .  pantoprazole (PROTONIX) EC tablet 40 mg  40 mg Oral Daily Niel Hummer, NP   40 mg at 10/17/14 1027  . QUEtiapine (SEROQUEL) tablet 100 mg  100 mg Oral QHS Nicholaus Bloom, MD   100 mg at 10/16/14 2129  . thiamine (VITAMIN B-1) tablet 100 mg  100 mg Oral Daily Niel Hummer, NP   100 mg at 10/17/14 2536  . tiotropium (SPIRIVA) inhalation capsule 18 mcg  18 mcg Inhalation Daily Niel Hummer, NP   18 mcg at 10/12/14 1512  Lab Results: No results found for this or any previous visit (from the past 48 hour(s)).  Physical Findings: AIMS: Facial and Oral Movements Muscles of Facial Expression: None, normal Lips and Perioral Area: None, normal Jaw: None, normal Tongue: None, normal,Extremity Movements Upper (arms, wrists, hands, fingers): None, normal Lower (legs, knees, ankles, toes): None, normal, Trunk Movements Neck, shoulders, hips: None, normal, Overall Severity Severity of abnormal movements (highest score from questions above): None, normal Incapacitation due to abnormal movements: None, normal Patient's awareness of abnormal movements (rate only patient's report): No Awareness, Dental Status Current problems with teeth and/or dentures?: No Does patient usually wear dentures?: No  CIWA:  CIWA-Ar Total: 1 COWS:  COWS Total Score: 0  Treatment Plan Summary: Daily contact with patient to assess and evaluate symptoms and progress in treatment and Medication management Supportive approach/coping skills/relapse prevention. Opioid dependence; continue the detox protocols Work a relapse prevention plan. Bipolar Disorder; continue the Trileptal 300 mg BID for mood stabilization. Benzodiazepine withdrawal: completed the librium taper:  Continue the Neurontin 300 mg for substance withdrawal syndrome/agitation Insomnia/mood instability; continue the Seroquel 100 mg HS Work with CBT/mindfulness. Continue Quvar for COPD & Synthroid 125 mcg for hypothyroidism.  Medical Decision Making:   Established Problem, Stable/Improving (1), Review of Psycho-Social Stressors (1), Review of Medication Regimen & Side Effects (2) and Review of New Medication or Change in Dosage (2)  Encarnacion Slates, PMHNP-BC 10/17/2014, 4:46 PM  I reviewed chart and agreed with the findings and treatment Plan.  Berniece Andreas, MD

## 2014-10-17 NOTE — Plan of Care (Signed)
Problem: Diagnosis: Increased Risk For Suicide Attempt Goal: STG-Patient Will Comply With Medication Regime Outcome: Progressing Pt compliant with medication regime     

## 2014-10-17 NOTE — Plan of Care (Signed)
Problem: Ineffective individual coping Goal: STG-Increase in ability to manage activities of daily living Outcome: Progressing Patient is more mobile. Up and dressed. Completing hygiene.  Problem: Alteration in mood & ability to function due to Goal: STG: Patient verbalizes decreases in signs of withdrawal Outcome: Progressing Patient's withdrawal is minimal. Only reporting restless legs, some pins and needs.

## 2014-10-17 NOTE — Progress Notes (Signed)
Patient up and more visible in milieu. Reports poor sleep primarily from restless legs over night. Complaining of low energy. Rates her depression at a 3/10 and anxiety at a 4/10. She reports her goal is to "stay calm" and that she's ready to go home. She plans to practice meditation today. Patient medicated per orders. Qvar given per her request. Support and reassurance offered. Patient rates her chronic back pain at a 7/10 and is medicated with celebrex. On reassess, patient is asleep. She denies SI/HI and remains safe. Jamie Kato

## 2014-10-17 NOTE — [facility] Group Notes (Signed)
[place] [facility] Group Notes:  (Nursing/MHT/Case Management/Adjunct)  Date:  10/17/2014  Time:  0915  Type of Therapy:  Nurse Education  Participation Level:  Did not attend  Participation Quality:    Affect:    Cognitive:     Insight:    Engagement in Group:    Modes of Intervention:    Summary of Progress/Problems: Patient was invited however elected to remain in bed.   Loletta Specter [facility] 10/17/2014, 9:48 AM

## 2014-10-17 NOTE — Progress Notes (Signed)
Patient did attend the evening speaker AA meeting.  

## 2014-10-18 MED ORDER — GABAPENTIN 300 MG PO CAPS: 300.0000 mg | ORAL_CAPSULE | Freq: Two times a day (BID) | ORAL | Status: DC

## 2014-10-18 MED ORDER — CHLORDIAZEPOXIDE HCL 5 MG PO CAPS: 15.0000 mg | ORAL_CAPSULE | Freq: Three times a day (TID) | ORAL | Status: DC

## 2014-10-18 MED ORDER — PANTOPRAZOLE SODIUM 40 MG PO TBEC: 40.0000 mg | DELAYED_RELEASE_TABLET | Freq: Every day | ORAL | Status: DC

## 2014-10-18 MED ORDER — OXCARBAZEPINE 300 MG PO TABS: 300.0000 mg | ORAL_TABLET | Freq: Two times a day (BID) | ORAL | Status: DC

## 2014-10-18 MED ORDER — FLUOXETINE HCL 40 MG PO CAPS: 40.0000 mg | ORAL_CAPSULE | Freq: Every day | ORAL | Status: DC

## 2014-10-18 MED ORDER — TIOTROPIUM BROMIDE MONOHYDRATE 18 MCG IN CAPS: 18.0000 ug | ORAL_CAPSULE | Freq: Every day | RESPIRATORY_TRACT | Status: DC

## 2014-10-18 MED ORDER — CELECOXIB 200 MG PO CAPS
200.0000 mg | ORAL_CAPSULE | Freq: Two times a day (BID) | ORAL | Status: DC
Start: 2014-10-18 — End: 2015-11-22

## 2014-10-18 MED ORDER — LEVOTHYROXINE SODIUM 125 MCG PO TABS: 125.0000 ug | ORAL_TABLET | Freq: Every day | ORAL | Status: DC

## 2014-10-18 MED ORDER — QUETIAPINE FUMARATE 100 MG PO TABS: 100.0000 mg | ORAL_TABLET | Freq: Every day | ORAL | Status: DC

## 2014-10-18 MED ORDER — BECLOMETHASONE DIPROPIONATE 80 MCG/ACT IN AERS: 1.0000 | INHALATION_SPRAY | RESPIRATORY_TRACT | Status: DC | PRN

## 2014-10-18 MED ORDER — CALCIUM CITRATE-VITAMIN D 315-200 MG-UNIT PO TABS: 1.0000 | ORAL_TABLET | Freq: Every day | ORAL | Status: DC

## 2014-10-18 MED ORDER — ONE-DAILY MULTI VITAMINS PO TABS
1.0000 | ORAL_TABLET | Freq: Every day | ORAL | Status: DC
Start: 2014-10-18 — End: 2021-01-15

## 2014-10-18 NOTE — Progress Notes (Signed)
Discharge note:  Patient discharged home per MD order.  Patient received all personal belongings, prescriptions and medication samples.  Patient informed she received a prescription for librium.  Informed her we do not give samples of narcotics.  Reviewed discharge instructions and medication administration.  She denies SI/HI/AVH.  Patient did not have a locker or any belongings to pick up from the search room.  Patient left ambulatory with her friend.

## 2014-10-18 NOTE — Progress Notes (Signed)
  [facility] Adult Case Management Discharge Plan :  Will you be returning to the same living situation after discharge:  Yes,  home At discharge, do you have transportation home?: Yes,  friend/family member Do you have the ability to pay for your medications: Yes,  Medicare  Release of information consent forms completed and submitted to Medical Records by CSW.  Patient to Follow up at: Follow-up Information    Schedule an appointment as soon as possible for a visit with [facility] .   Why:  for post discharge follow up. Patient refused to consent to allowing social worker to make appt or have medical records sent to this provider.    Contact information:   Jadene Pierini :  [phone]       Follow up with [name] MD.   Why:  Patient is current in services with this provider, patient states she will schedule her own follow up appt, did not provide consent for CSW to schedule.  Would like discharge clinicals sent.   Contact information:   [address], [place], [place] [address] Phone: [phone]      Patient denies SI/HI: Yes,  during self report.     Safety Planning and Suicide Prevention discussed: yes, SPE completed with pt, as pt refused to consent to family contact. SPI pamphlet provided to pt and pt was encouraged to share information with support network, ask questions, and talk about any concerns relating to SPE. Pt denies access to guns/firearms and verbalized understanding of information provided. Mobile Crisis information also provided to pt.   Have you used any form of tobacco in the last 30 days? (Cigarettes, Smokeless Tobacco, Cigars, and/or Pipes): No  Has patient been referred to the Quitline?: N/A patient is not a smoker  [employer] [place] 10/18/2014, 10:06 AM

## 2014-10-18 NOTE — Progress Notes (Signed)
Pt reports she has had an fair day.  She is looking forward to discharging tomorrow because it is there daughter's birthday.  She says she is not having any withdrawal symptoms this evening.  Writer has not noticed her using her walker this evening on the unit.  She denies SI/HI/AVH.  Pt is pleasant and cooperative with staff.  She makes her needs known to staff.  Support and encouragement offered.  Safety maintained with q15 minute checks.

## 2014-10-18 NOTE — Discharge Summary (Signed)
Physician Discharge Summary Note  Patient:  Victoria Levine is an 64 y.o., female MRN:  [id number] DOB:  [birth year]  Patient phone:  [phone] (home)   address:   [place] [address],   Total Time spent with patient: Greater than 30 minutes  Date of Admission:  10/12/2014  of Discharge: 10-18-14  Reason for Admission:  Drug & Alcohol detox  Principal Problem: Benzodiazepine dependence, Bipolar 1 disorder, depressed, Opioid abuse with opioid-induced mood disorder.  Discharge Diagnoses: Patient Active Problem List   Diagnosis Date Noted  . Benzodiazepine dependence [F13.20] 10/13/2014  . Opioid abuse with opioid-induced mood disorder [F11.14] 10/13/2014  . Bipolar 1 disorder, depressed [F31.9] 10/12/2014  . Spondylolisthesis of lumbar region [M43.16] 07/28/2014  . Osteoporosis, unspecified [M81.0] 11/17/2013  . Migraines [G43.909]   . Herpes [B00.9]   . HYPOTHYROIDISM [E03.9] 08/13/2008  . HYPERCHOLESTEROLEMIA [E78.0] 08/13/2008  . BIPOLAR DISORDER UNSPECIFIED [F31.9] 08/13/2008  . CHRONIC OBSTRUCTIVE ASTHMA UNSPECIFIED [J44.9] 08/13/2008  . GASTROESOPHAGEAL REFLUX DISEASE [K21.9] 08/13/2008  . HIATAL HERNIA [K44.9] 08/13/2008  . ALLERGY [T78.40XA] 08/13/2008   Musculoskeletal: Strength & Muscle Tone: within normal limits Gait & Station: normal Patient leans: N/A  Psychiatric Specialty Exam: Physical Exam  Psychiatric: Her speech is normal and behavior is normal. Judgment and thought content normal. Her mood appears not anxious. Her affect is not angry, not blunt, not labile and not inappropriate. Cognition and memory are normal. She does not exhibit a depressed mood.    Review of Systems  Constitutional: Negative.   HENT: Negative.   Eyes: Negative.   Respiratory: Negative.   Cardiovascular: Negative.   Gastrointestinal: Negative.   Genitourinary: Negative.   Musculoskeletal: Negative.   Skin: Negative.   Neurological: Negative.    Endo/Heme/Allergies: Negative.   Psychiatric/Behavioral: Positive for depression (Stable) and substance abuse (Opioid, ETOH & Opioid dependence). Negative for suicidal ideas, hallucinations and memory loss. The patient has insomnia (Stable). The patient is not nervous/anxious.     Blood pressure 117/79, pulse 75, temperature 98.5 F (36.9 C), temperature source Oral, resp. rate 20, height 5' 3.5" (1.613 m), weight 71.215 kg (157 lb).Body mass index is 27.37 kg/(m^2).  See Md's SRA   Have you used any form of tobacco in the last 30 days? (Cigarettes, Smokeless Tobacco, Cigars, and/or Pipes): No  Has this patient used any form of tobacco in the last 30 days? (Cigarettes, Smokeless Tobacco, Cigars, and/or Pipes) No  Past Medical History:  Past Medical History  Diagnosis Date  . Asthma   . Bipolar disorder   . Depression   . DDD (degenerative disc disease)   . Hiatal hernia   . Osteoporosis 11/2013    T score -2.6 AP spine. 1 dose Reclast with reaction Prolia since 10/2011 most recent DEXA shows significant improvement at all measured sites.  . S/P adenoidectomy   . S/P tonsillectomy   . Hypothyroid   . Migraines   . Herpes     Past Surgical History  Procedure Laterality Date  . Cesarean section    . Dilation and curettage of uterus    . Hysteroscopy    . Compression fracture spine    . Lumbar fusion    . Eye surgery Bilateral     CATARACTS REMOVED, NO LENS PLACED   Family History:  Family History  Problem Relation Age of Onset  . Adopted: Yes   Social History:  History  Alcohol Use No     History  Drug Use Not  on file    History   Social History  . Marital Status: Single    Spouse Name: N/A  . Number of Children: N/A  . Years of Education: N/A   Social History Main Topics  . Smoking status: Never Smoker   . Smokeless tobacco: Not on file  . Alcohol Use: No  . Drug Use: Not on file  . Sexual Activity: Not Currently    Birth Control/ Protection:  Post-menopausal   Other Topics Concern  . None   Social History Narrative   Risk to Self: Is patient at risk for suicide?: No What has been your use of drugs/alcohol within the last 12 months?: No ETOH, but reports an excess of 5 Oxycodone tablets a day mixed with Xanax. Risk to Others: No Prior Inpatient Therapy: Yes Prior Outpatient Therapy: Yes  Level of Care:  OP  Hospital Course: 64 Y/o female who states that five years ago she got introduced to the opioids after 5 back surgeries. States she was "in [place]". She had been on Xanax for years before. States it has now gotten to a point she stays in been in bed watching [employer] movies with her cat. States she takes Xanax 1 mg from two to six a day. She states her husband was getting a prescription for 180 Xanax 1 mg and she uses them. She has been getting Oxy IR from her surgeon. States she had a rude awakening when her daughter and sponsor cut her off as states she was told she is not predictable. She does not drink (25 years sobriety) she ran out of opioids 15 mg Oxy IR ( 150) she had surgery four weeks ago. Diagnosed with Bipolar Disorder at age 64. Saw Dr. Ebony Levine. She was given antidepressants that made her manic. She was placed on Lithium that she took until it "killed her thyroid." states she now takes Prozac 20 mg two a day Trileptal 300 mg BID, Wellbutrin XL 300 mg in AM. This combination she states has help with her mood. She is really afraid of the withdrawal and her mood getting to be all over the place.  Upon her arrival & admision to the adult unit, Victoria Levine was evaluated & her presenting symptoms identified. She reported having been drinking alcohol & using prescriptions pills (Oxycodone & Xanax) as well. She was in need of drug detoxification treatments. The medication management for the presenting symptoms were discussed & initiated targeting those symptoms. She received both Librium & Clonidine detoxification protocols for  opioid/Benzodiazepine detox. She was enrolled in the group counseling sessions & encouraged to participate in the unit programming. Her other pre-existing medical problems were identified & her home medications restarted accordingly. She was provided with a walker to aid her mobility while on the unit. She presented with arthritic knee pains. Breckyn was evaluated on daily basis by the clinical providers to assure her response to the treatment regimen.  Besides the detoxification treatment protocols, Louna was medicated & discharged on Prozac 40 mg for depression, Gabapentin 300 mg for agitation, Trileptal 300 mg for mood stabilization & Seroquel 100 mg for mood control. Lynore is doing well with her detoxifcation treatments, however, she still has to complete the Librium detox protocols. Her mood is stable. She has decided to be discharged to her home today to complete her Librium detoxification treatment at her home. She states that today is her daughter's birthday & she feels it is appropriate to call her daughter to wish her a happy birthday  from her own house phone. Sarya will be discharged with some Librium 5 mg capsules (15 mg daily) to complete at her home, #63 capsules provided.  Upon discharge, she adamantly denies any suicidal, homicidal ideations,delusional thought, auditory, visual hallucinations and or withdrawal symptoms. She received a 4 days worth supply sample of her [facility] discharge medications. Transportation per a friend.    Consults:  psychiatry  Significant Diagnostic Studies:  labs: CBC with diff, CMP, UDS, toxicology tests, U/A, results reviewed, stable  Discharge Vitals:   Blood pressure 117/79, pulse 75, temperature 98.5 F (36.9 C), temperature source Oral, resp. rate 20, height 5' 3.5" (1.613 m), weight 71.215 kg (157 lb). Body mass index is 27.37 kg/(m^2). Lab Results:   No results found for this or any previous visit (from the past 72 hour(s)).  Physical  Findings: AIMS: Facial and Oral Movements Muscles of Facial Expression: None, normal Lips and Perioral Area: None, normal Jaw: None, normal Tongue: None, normal,Extremity Movements Upper (arms, wrists, hands, fingers): None, normal Lower (legs, knees, ankles, toes): None, normal, Trunk Movements Neck, shoulders, hips: None, normal, Overall Severity Severity of abnormal movements (highest score from questions above): None, normal Incapacitation due to abnormal movements: None, normal Patient's awareness of abnormal movements (rate only patient's report): No Awareness, Dental Status Current problems with teeth and/or dentures?: No Does patient usually wear dentures?: No  CIWA:  CIWA-Ar Total: 1 COWS:  COWS Total Score: 0  See Psychiatric Specialty Exam and Suicide Risk Assessment completed by Attending Physician prior to discharge.  Discharge destination:  Home  Is patient on multiple antipsychotic therapies at discharge:  No   Has Patient had three or more failed trials of antipsychotic monotherapy by history:  No  Recommended Plan for Multiple Antipsychotic Therapies: NA    Medication List    STOP taking these medications        acyclovir ointment 5 %  Commonly known as:  ZOVIRAX     albuterol (2.5 MG/3ML) 0.083% nebulizer solution  Commonly known as:  PROVENTIL     ALPRAZolam 0.5 MG tablet  Commonly known as:  XANAX     buPROPion 300 MG 24 hr tablet  Commonly known as:  WELLBUTRIN XL     methocarbamol 500 MG tablet  Commonly known as:  ROBAXIN     ondansetron 4 MG tablet  Commonly known as:  ZOFRAN     oxyCODONE 15 MG Tb12  Commonly known as:  OXYCONTIN     ranitidine 300 MG tablet  Commonly known as:  ZANTAC     rizatriptan 10 MG disintegrating tablet  Commonly known as:  MAXALT-MLT     valACYclovir 500 MG tablet  Commonly known as:  VALTREX      TAKE these medications      Indication   beclomethasone 80 MCG/ACT inhaler  Commonly known as:  QVAR   Inhale 1 puff into the lungs as needed (sob). For shortness of breath   Indication:  Chronic Obstructive Lung Disease     calcium citrate-vitamin D 315-200 MG-UNIT per tablet  Commonly known as:  CALCIUM + D  Take 1 tablet by mouth daily. For bone health   Indication:  Low Amount of Calcium in the Blood     celecoxib 200 MG capsule  Commonly known as:  CELEBREX  Take 1 capsule (200 mg total) by mouth 2 (two) times daily. For arthritic pain   Indication:  Arthritis     chlordiazePOXIDE 5 MG capsule  Commonly known as:  LIBRIUM  Take 3 capsules (15 mg total) by mouth 3 (three) times daily. For alcohol detox treatment   Indication:  Alcohol detox     FLUoxetine 40 MG capsule  Commonly known as:  PROZAC  Take 1 capsule (40 mg total) by mouth daily. For depression   Indication:  Major Depressive Disorder     gabapentin 300 MG capsule  Commonly known as:  NEURONTIN  Take 1 capsule (300 mg total) by mouth 2 (two) times daily. For agitation/pain management   Indication:  Agitation, Chronic pain     levothyroxine 125 MCG tablet  Commonly known as:  SYNTHROID  Take 1 tablet (125 mcg total) by mouth daily before breakfast. For low functioning thyroid   Indication:  Underactive Thyroid     multivitamin tablet  Take 1 tablet by mouth daily. For low vitamin   Indication:  Low vitamin     Oxcarbazepine 300 MG tablet  Commonly known as:  TRILEPTAL  Take 1 tablet (300 mg total) by mouth 2 (two) times daily. Mood stabilization   Indication:  Mood stabilization     pantoprazole 40 MG tablet  Commonly known as:  PROTONIX  Take 1 tablet (40 mg total) by mouth daily. For acid reflux   Indication:  Gastroesophageal Reflux Disease     QUEtiapine 100 MG tablet  Commonly known as:  SEROQUEL  Take 1 tablet (100 mg total) by mouth at bedtime. For mood control   Indication:  Mood control     tiotropium 18 MCG inhalation capsule  Commonly known as:  SPIRIVA  Place 1 capsule (18 mcg total)  into inhaler and inhale daily. For COPD   Indication:  Chronic Obstructive Lung Disease       Follow-up Information    Schedule an appointment as soon as possible for a visit with [facility] .   Why:  for post discharge follow up. Patient refused to consent to allowing social worker to make appt or have medical records sent to this provider.    Contact information:   Jadene Pierini :  [phone]       Follow up with [name] MD.   Why:  Patient is current in services with this provider, patient states she will schedule her own follow up appt, did not provide consent for CSW to schedule.  Would like discharge clinicals sent.   Contact information:   [address], [place], [place] [address] Phone: [phone]     Follow-up recommendations: Activity:  As tolerated Diet: As recommended by your primary care doctor. Keep all scheduled follow-up appointments as recommended.    Comments: Take all your medications as prescribed by your mental healthcare provider. Report any adverse effects and or reactions from your medicines to your outpatient provider promptly. Patient is instructed and cautioned to not engage in alcohol and or illegal drug use while on prescription medicines. In the event of worsening symptoms, patient is instructed to call the crisis hotline, 911 and or go to the nearest ED for appropriate evaluation and treatment of symptoms. Follow-up with your primary care provider for your other medical issues, concerns and or health care needs.   Total Discharge Time: Greater than 30 minutes  Signed: Encarnacion Slates, PMHNP, FNP-BC 10/18/2014, 11:51 AM  I personally assessed the patient and formulated the plan Geralyn Flash A. [name], M.D.

## 2014-10-18 NOTE — Tx Team (Signed)
Interdisciplinary Treatment Plan Update (Adult)  Date Reviewed:  10/18/2014  Time Reviewed: 10:01 AM   Progress in Treatment:   Attending groups: No, Description: she is not attending groups.  Compliant with medication administration:  Yes Denies suicidal/homicidal ideation:  Yes Discussing issues with staff:  Yes Responding to medication:  Yes Understanding diagnosis:  Yes Other:  New Problem(s) identified:  None  Discharge Plan or Barriers:   pt to see pcp for med management. Refusing to allow CSW to send records or make appts with therapists. Pt signed consent for clinicals to be sent to PCP but is not consenting to CSW making follow-up. Pt to return home at d/c.   Reasons for Continued Hospitalization:  none  Comments:  pt continues to stay in room. Irritable. refusing to allow CSW to make appts.   Estimated Length of Stay:  d/c today    Review of initial/current patient goals per problem list:   1.  Goal(s): Patient will participate in aftercare plan  Met:  adequate for d/c.   Target date: today 8/1  As evidenced by: Patient will participate within aftercare plan AEB aftercare provider and housing plan at discharge being identified.   7/28:  Patient states she is current w [facility] and Janeth Rase at [facility], patient declined to have CSW schedule follow up at this time, will check back later  8/1: Pt continues to refuse to allow CSW to make follow-up appts but plans to schedule med management with PCP and therapy appts at d/c. Pt to return home.   2.  Goal (s): Patient will exhibit decreased depressive symptoms and suicidal ideations.  Met:  Yes  Target date: 10/18/14  As evidenced by: Patient will utilize self rating of depression at 3 or below and demonstrate decreased signs of depression or be deemed stable for discharge by MD.  10/14/14:  Patient rates depression at 5, somewhat irritable and isolative, states she does  not feel well due to withdrawal  10/18/14: Pt reports minimal depressive sx and reports that she is ready to d/c home.   3.  Goal(s): Patient will demonstrate decreased signs and symptoms of anxiety.  Met:  Yes   Target date: 10/18/14  As evidenced by: Patient will utilize self rating of anxiety at 3 or below and demonstrated decreased signs of anxiety, or be deemed stable for discharge by MD  10/14/14:  Patient rates anxiety at 8, irritable.  Goal not met  10/18/14: Pt not rating anxiety, but reports that she feels ready to d/c. Calm/slightly irritable.   4.  Goal(s): Patient will demonstrate decreased signs of withdrawal due to substance abuse  Met:  Yes   Target date:  10/18/14  As evidenced by: Patient will produce a CIWA/COWS score of 0, have stable vitals signs, and no symptoms of withdrawal  10/14/14:  Goal not met.  Patient reports agitation and tremors.  COWS - 5, CIWA - 4  10/18/14: PT reports no signs of withdrawal, with COWS of 0/CIWA 0 and stable vitals.    Attendees:   Signature: Gypsy Balsam MD 10/18/2014 10:01 AM   Signature: Maxie Better, LCSWA   Signature: Doy Mince RN   Signature: Hanley Seamen, LCSWA   Signature: Addison Naegeli, RN, UR   Signature: Jobe Marker, [facility] TCT   Signature:    Signature:    Signature:    Signature:    Signature:   Signature:   Signature:    Scribe for Treatment Team:  Maxie Better, LCSWA Clinical Social Worker 10/18/2014 10:01 AM

## 2014-10-18 NOTE — Progress Notes (Signed)
Recreation Therapy Notes  Date: 08.01.16 Time: 9:30 am Location: [address] Group Room  Group Topic: Stress Management  Goal Area(s) Addresses:  Patient will verbalize importance of using healthy stress management.  Patient will identify positive emotions associated with healthy stress management.   Intervention: Stress Management  Activity :  Guided Automotive engineer.  LRT introduced and educated patients on the stress management technique of guided imagery script.  A script was read and patients were asked to follow a long as the LRT read the script a loud to engaged in the technique of guided imagery.  Education:  Stress Management, Discharge Planning.   Education Outcome: Acknowledges edcuation/In group clarification offered/Needs additional education  Clinical Observations/Feedback: Patient did not attend group.   Victorino Sparrow, LRT/CTRS   Ria Comment, Drianna Chandran A 10/18/2014 1:13 PM

## 2014-10-18 NOTE — BHH Group Notes (Signed)
[facility] LCSW Aftercare Discharge Planning Group Note   10/18/2014 9:19 AM  Participation Quality:  Invited. Chose not to attend. Sleeping in room.   [employer]

## 2014-10-18 NOTE — Progress Notes (Signed)
Patient ID: Victoria Levine, female   DOB: 10-May-1950, 64 y.o.   MRN: [id number] PER STATE REGULATIONS 482.30  THIS CHART WAS REVIEWED FOR MEDICAL NECESSITY WITH RESPECT TO THE PATIENT'S ADMISSION/ DURATION OF STAY.  NEXT REVIEW DATE: 10/19/2014  Chauncy Lean, RN, BSN CASE MANAGER

## 2014-10-18 NOTE — BHH Suicide Risk Assessment (Signed)
[facility] Discharge Suicide Risk Assessment   Demographic Factors:  Caucasian  Total Time spent with patient: 30 minutes  Musculoskeletal: Strength & Muscle Tone: within normal limits Gait & Station: affected by recent back surgery Patient leans: normal  Psychiatric Specialty Exam: Physical Exam  Review of Systems  Constitutional: Negative.   HENT: Negative.   Eyes: Negative.   Respiratory: Negative.   Cardiovascular: Negative.   Gastrointestinal: Negative.   Genitourinary: Negative.   Musculoskeletal: Positive for back pain.  Skin: Negative.   Neurological: Negative.   Endo/Heme/Allergies: Negative.   Psychiatric/Behavioral: The patient is nervous/anxious.     Blood pressure 117/79, pulse 75, temperature 98.5 F (36.9 C), temperature source Oral, resp. rate 20, height 5' 3.5" (1.613 m), weight 71.215 kg (157 lb).Body mass index is 27.37 kg/(m^2).  General Appearance: Fairly Groomed  Engineer, water::  Fair  Speech:  Clear and [id number]  Volume:  Normal  Mood:  Euthymic  Affect:  Appropriate  Thought Process:  Coherent and Goal Directed  Orientation:  Full (Time, Place, and Person)  Thought Content:  plans as she moves on, relapse prevention plan  Suicidal Thoughts:  No  Homicidal Thoughts:  No  Memory:  Immediate;   Fair Recent;   Fair Remote;   Fair  Judgement:  Fair  Insight:  Present  Psychomotor Activity:  Normal  Concentration:  Fair  Recall:  [employer] [place]  Language: Fair  Akathisia:  No  Handed:  Right  AIMS (if indicated):     Assets:  Desire for Improvement Housing Social Support  Sleep:  Number of Hours: 6.75  Cognition: WNL  ADL's:  Impaired   Have you used any form of tobacco in the last 30 days? (Cigarettes, Smokeless Tobacco, Cigars, and/or Pipes): No  Has this patient used any form of tobacco in the last 30 days? (Cigarettes, Smokeless Tobacco, Cigars, and/or Pipes) No  Mental Status Per Nursing Assessment::   On Admission:      Current Mental Status by Physician: In full contact with reality. There are no S/S of withdrawal. There are no active SI plans or intent. States that she is ready to go home. She is down to Librium 15 mg TID and she is going to her PCP to continue to wean off. She states she has not felt the absence of the opioids. States this is the "cleanest" and more clear headed she has been in years   Loss Factors: Decline in physical health  Historical Factors: NA  Risk Reduction Factors:   Sense of responsibility to family, Positive social support and Positive therapeutic relationship  Continued Clinical Symptoms:  Bipolar Disorder:   Depressive phase Alcohol/Substance Abuse/Dependencies  Cognitive Features That Contribute To Risk:  None    Suicide Risk:  Minimal: No identifiable suicidal ideation.  Patients presenting with no risk factors but with morbid ruminations; may be classified as minimal risk based on the severity of the depressive symptoms  Principal Problem: Benzodiazepine dependence Discharge Diagnoses:  Patient Active Problem List   Diagnosis Date Noted  . Benzodiazepine dependence [F13.20] 10/13/2014  . Opioid abuse with opioid-induced mood disorder [F11.14] 10/13/2014  . Bipolar 1 disorder, depressed [F31.9] 10/12/2014  . Spondylolisthesis of lumbar region [M43.16] 07/28/2014  . Osteoporosis, unspecified [M81.0] 11/17/2013  . Migraines [G43.909]   . Herpes [B00.9]   . HYPOTHYROIDISM [E03.9] 08/13/2008  . HYPERCHOLESTEROLEMIA [E78.0] 08/13/2008  . BIPOLAR DISORDER UNSPECIFIED [F31.9] 08/13/2008  . CHRONIC OBSTRUCTIVE ASTHMA UNSPECIFIED [J44.9] 08/13/2008  . GASTROESOPHAGEAL REFLUX DISEASE [  K21.9] 08/13/2008  . HIATAL HERNIA [K44.9] 08/13/2008  . ALLERGY [T78.40XA] 08/13/2008    Follow-up Information    Schedule an appointment as soon as possible for a visit with [facility] .   Why:  for post discharge follow up. Patient refused to consent to  allowing social worker to make appt or have medical records sent to this provider.    Contact information:   Jadene Pierini :  [phone]       Follow up with [name] MD.   Why:  Patient is current in services with this provider, patient states she will schedule her own follow up appt, did not provide consent for CSW to schedule.  Would like discharge clinicals sent.   Contact information:   [address], [place], [place] [address] Phone: [phone]      Plan Of Care/Follow-up recommendations:  Activity:  as tolerated Diet:  regular Will recommend to continue to wean off the Librium as tolerated. She is down to 15 mg TID. Could be kept on this dose for a week and then decrease to 10 mg TID for a week etc. Is patient on multiple antipsychotic therapies at discharge:  No   Has Patient had three or more failed trials of antipsychotic monotherapy by history:  No  Recommended Plan for Multiple Antipsychotic Therapies: NA    Rolando Whitby A 10/18/2014, 11:46 AM

## 2014-10-18 NOTE — Progress Notes (Signed)
D: Patient has bright affect and reduced depressive symptoms.  She has been joking with staff and her peers.  Her mood seems upbeat.  She states, "it's a shame I live alone because I'm so funny."  Patient does enjoy talking about her two cats and is eager to see them.  She denies SI/HI/AVH. A: Continue to monitor medication management and MD orders.  Safety checks completed every 15 minutes per protocol.  Offer support and encouragement as needed. R: Patient is interacting well with her peers and staff.

## 2014-10-20 DIAGNOSIS — Z09 Encounter for follow-up examination after completed treatment for conditions other than malignant neoplasm: Secondary | ICD-10-CM | POA: Diagnosis not present

## 2014-10-20 DIAGNOSIS — E039 Hypothyroidism, unspecified: Secondary | ICD-10-CM | POA: Diagnosis not present

## 2014-10-21 DIAGNOSIS — F4310 Post-traumatic stress disorder, unspecified: Secondary | ICD-10-CM | POA: Diagnosis not present

## 2014-10-21 DIAGNOSIS — F441 Dissociative fugue: Secondary | ICD-10-CM | POA: Diagnosis not present

## 2014-10-21 DIAGNOSIS — F319 Bipolar disorder, unspecified: Secondary | ICD-10-CM | POA: Diagnosis not present

## 2014-10-26 DIAGNOSIS — F3131 Bipolar disorder, current episode depressed, mild: Secondary | ICD-10-CM | POA: Diagnosis not present

## 2014-10-26 DIAGNOSIS — E039 Hypothyroidism, unspecified: Secondary | ICD-10-CM | POA: Diagnosis not present

## 2014-10-26 DIAGNOSIS — K219 Gastro-esophageal reflux disease without esophagitis: Secondary | ICD-10-CM | POA: Diagnosis not present

## 2014-10-28 NOTE — Telephone Encounter (Addendum)
Phone call no answer, noted in chart recent admission. Will continue to follow up pt for Prolia.Patient returned PC at 4 pm. She has scheduled annual exam  On 11/19/14 with N Young. I told her that I will check insurance benefits prior to visit with Elon Alas.

## 2014-11-04 ENCOUNTER — Ambulatory Visit: Payer: Commercial Managed Care - HMO | Admitting: Physical Therapy

## 2014-11-09 DIAGNOSIS — F441 Dissociative fugue: Secondary | ICD-10-CM | POA: Diagnosis not present

## 2014-11-09 DIAGNOSIS — F319 Bipolar disorder, unspecified: Secondary | ICD-10-CM | POA: Diagnosis not present

## 2014-11-09 DIAGNOSIS — F4310 Post-traumatic stress disorder, unspecified: Secondary | ICD-10-CM | POA: Diagnosis not present

## 2014-11-11 ENCOUNTER — Ambulatory Visit: Payer: Commercial Managed Care - HMO | Admitting: Physical Therapy

## 2014-11-15 NOTE — Telephone Encounter (Signed)
Phone call to pt. Regarding PA and Referral from PCP needed prior to Prolia injection. She does not want to re schedule with Izora Gala on 11/19/14. She will come back for prolia injection if PA approval is not ready from ins company . Information sent today. Per Linus Salmons referral has

## 2014-11-19 ENCOUNTER — Encounter: Payer: Self-pay | Admitting: Gynecology

## 2014-11-19 ENCOUNTER — Ambulatory Visit (INDEPENDENT_AMBULATORY_CARE_PROVIDER_SITE_OTHER): Payer: Commercial Managed Care - HMO | Admitting: Women's Health

## 2014-11-19 ENCOUNTER — Encounter: Payer: Self-pay | Admitting: Women's Health

## 2014-11-19 VITALS — BP 124/80 | Ht 64.0 in | Wt 166.0 lb

## 2014-11-19 DIAGNOSIS — Z01419 Encounter for gynecological examination (general) (routine) without abnormal findings: Secondary | ICD-10-CM | POA: Diagnosis not present

## 2014-11-19 DIAGNOSIS — M810 Age-related osteoporosis without current pathological fracture: Secondary | ICD-10-CM

## 2014-11-19 MED ORDER — DENOSUMAB 60 MG/ML ~~LOC~~ SOLN
60.0000 mg | Freq: Once | SUBCUTANEOUS | Status: AC
  Administered 2014-11-19: 60 mg via SUBCUTANEOUS

## 2014-11-19 NOTE — Patient Instructions (Signed)
Chronic Back Pain  When back pain lasts longer than 3 months, it is called chronic back pain.People with chronic back pain often go through certain periods that are more intense (flare-ups).  CAUSES Chronic back pain can be caused by wear and tear (degeneration) on different structures in your back. These structures include:  The bones of your spine (vertebrae) and the joints surrounding your spinal cord and nerve roots (facets).  The strong, fibrous tissues that connect your vertebrae (ligaments). Degeneration of these structures may result in pressure on your nerves. This can lead to constant pain. HOME CARE INSTRUCTIONS  Avoid bending, heavy lifting, prolonged sitting, and activities which make the problem worse.  Take brief periods of rest throughout the day to reduce your pain. Lying down or standing usually is better than sitting while you are resting.  Take over-the-counter or prescription medicines only as directed by your caregiver. SEEK IMMEDIATE MEDICAL CARE IF:   You have weakness or numbness in one of your legs or feet.  You have trouble controlling your bladder or bowels.  You have nausea, vomiting, abdominal pain, shortness of breath, or fainting. Document Released: 04/12/2004 Document Revised: 05/28/2011 Document Reviewed: 02/17/2011 [facility] Patient Information 2015 [place], [place]. This information is not intended to replace advice given to you by your health care provider. Make sure you discuss any questions you have with your health care provider. Health Recommendations for Postmenopausal Women Respected and ongoing research has looked at the most common causes of death, disability, and poor quality of life in postmenopausal women. The causes include heart disease, diseases of blood vessels, diabetes, depression, cancer, and bone loss (osteoporosis). Many things can be done to help lower the chances of developing these and other common problems. CARDIOVASCULAR  DISEASE Heart Disease: A heart attack is a medical emergency. Know the signs and symptoms of a heart attack. Below are things women can do to reduce their risk for heart disease.   Do not smoke. If you smoke, quit.  Aim for a healthy weight. Being overweight causes many preventable deaths. Eat a healthy and balanced diet and drink an adequate amount of liquids.  Get moving. Make a commitment to be more physically active. Aim for 30 minutes of activity on most, if not all days of the week.  Eat for heart health. Choose a diet that is low in saturated fat and cholesterol and eliminate trans fat. Include whole grains, vegetables, and fruits. Read and understand the labels on food containers before buying.  Know your numbers. Ask your caregiver to check your blood pressure, cholesterol (total, HDL, LDL, triglycerides) and blood glucose. Work with your caregiver on improving your entire clinical picture.  High blood pressure. Limit or stop your table salt intake (try salt substitute and food seasonings). Avoid salty foods and drinks. Read labels on food containers before buying. Eating well and exercising can help control high blood pressure. STROKE  Stroke is a medical emergency. Stroke may be the result of a blood clot in a blood vessel in the brain or by a brain hemorrhage (bleeding). Know the signs and symptoms of a stroke. To lower the risk of developing a stroke:  Avoid fatty foods.  Quit smoking.  Control your diabetes, blood pressure, and irregular heart rate. THROMBOPHLEBITIS (BLOOD CLOT) OF THE LEG  Becoming overweight and leading a stationary lifestyle may also contribute to developing blood clots. Controlling your diet and exercising will help lower the risk of developing blood clots. CANCER SCREENING  Breast Cancer: Take  steps to reduce your risk of breast cancer.  You should practice "breast self-awareness." This means understanding the normal appearance and feel of your breasts  and should include breast self-examination. Any changes detected, no matter how small, should be reported to your caregiver.  After age 74, you should have a clinical breast exam (CBE) every year.  Starting at age 13, you should consider having a mammogram (breast X-ray) every year.  If you have a family history of breast cancer, talk to your caregiver about genetic screening.  If you are at high risk for breast cancer, talk to your caregiver about having an MRI and a mammogram every year.  Intestinal or Stomach Cancer: Tests to consider are a rectal exam, fecal occult blood, sigmoidoscopy, and colonoscopy. Women who are high risk may need to be screened at an earlier age and more often.  Cervical Cancer:  Beginning at age 48, you should have a Pap test every 3 years as long as the past 3 Pap tests have been normal.  If you have had past treatment for cervical cancer or a condition that could lead to cancer, you need Pap tests and screening for cancer for at least 20 years after your treatment.  If you had a hysterectomy for a problem that was not cancer or a condition that could lead to cancer, then you no longer need Pap tests.  If you are between ages 39 and 53, and you have had normal Pap tests going back 10 years, you no longer need Pap tests.  If Pap tests have been discontinued, risk factors (such as a new sexual partner) need to be reassessed to determine if screening should be resumed.  Some medical problems can increase the chance of getting cervical cancer. In these cases, your caregiver may recommend more frequent screening and Pap tests.  Uterine Cancer: If you have vaginal bleeding after reaching menopause, you should notify your caregiver.  Ovarian Cancer: Other than yearly pelvic exams, there are no reliable tests available to screen for ovarian cancer at this time except for yearly pelvic exams.  Lung Cancer: Yearly chest X-rays can detect lung cancer and should be done  on high risk women, such as cigarette smokers and women with chronic lung disease (emphysema).  Skin Cancer: A complete body skin exam should be done at your yearly examination. Avoid overexposure to the sun and ultraviolet light lamps. Use a strong sun block cream when in the sun. All of these things are important for lowering the risk of skin cancer. MENOPAUSE Menopause Symptoms: Hormone therapy products are effective for treating symptoms associated with menopause:  Moderate to severe hot flashes.  Night sweats.  Mood swings.  Headaches.  Tiredness.  Loss of sex drive.  Insomnia.  Other symptoms. Hormone replacement carries certain risks, especially in older women. Women who use or are thinking about using estrogen or estrogen with progestin treatments should discuss that with their caregiver. Your caregiver will help you understand the benefits and risks. The ideal dose of hormone replacement therapy is not known. The Food and Drug Administration (FDA) has concluded that hormone therapy should be used only at the lowest doses and for the shortest amount of time to reach treatment goals.  OSTEOPOROSIS Protecting Against Bone Loss and Preventing Fracture If you use hormone therapy for prevention of bone loss (osteoporosis), the risks for bone loss must outweigh the risk of the therapy. Ask your caregiver about other medications known to be safe and effective for preventing  bone loss and fractures. To guard against bone loss or fractures, the following is recommended:  If you are younger than age 82, take 1000 mg of calcium and at least 600 mg of Vitamin D per day.  If you are older than age 70 but younger than age 77, take 1200 mg of calcium and at least 600 mg of Vitamin D per day.  If you are older than age 54, take 1200 mg of calcium and at least 800 mg of Vitamin D per day. Smoking and excessive alcohol intake increases the risk of osteoporosis. Eat foods rich in calcium and  vitamin D and do weight bearing exercises several times a week as your caregiver suggests. DIABETES Diabetes Mellitus: If you have type I or type 2 diabetes, you should keep your blood sugar under control with diet, exercise, and recommended medication. Avoid starchy and fatty foods, and too many sweets. Being overweight can make diabetes control more difficult. COGNITION AND MEMORY Cognition and Memory: Menopausal hormone therapy is not recommended for the prevention of cognitive disorders such as Alzheimer's disease or memory loss.  DEPRESSION  Depression may occur at any age, but it is common in elderly women. This may be because of physical, medical, social (loneliness), or financial problems and needs. If you are experiencing depression because of medical problems and control of symptoms, talk to your caregiver about this. Physical activity and exercise may help with mood and sleep. Community and volunteer involvement may improve your sense of value and worth. If you have depression and you feel that the problem is getting worse or becoming severe, talk to your caregiver about which treatment options are best for you. ACCIDENTS  Accidents are common and can be serious in elderly woman. Prepare your house to prevent accidents. Eliminate throw rugs, place hand bars in bath, shower, and toilet areas. Avoid wearing high heeled shoes or walking on wet, snowy, and icy areas. Limit or stop driving if you have vision or hearing problems, or if you feel you are unsteady with your movements and reflexes. HEPATITIS C Hepatitis C is a type of viral infection affecting the liver. It is spread mainly through contact with blood from an infected person. It can be treated, but if left untreated, it can lead to severe liver damage over the years. Many people who are infected do not know that the virus is in their blood. If you are a "baby-boomer", it is recommended that you have one screening test for Hepatitis  C. IMMUNIZATIONS  Several immunizations are important to consider having during your senior years, including:   Tetanus, diphtheria, and pertussis booster shot.  Influenza every year before the flu season begins.  Pneumonia vaccine.  Shingles vaccine.  Others, as indicated based on your specific needs. Talk to your caregiver about these. Document Released: 04/27/2005 Document Revised: 07/20/2013 Document Reviewed: 12/22/2007 [facility] Patient Information 2015 [place], [place]. This information is not intended to replace advice given to you by your health care provider. Make sure you discuss any questions you have with your health care provider.

## 2014-11-19 NOTE — Progress Notes (Signed)
.   VERITA KURODA 27-Jun-1950 [id number]    History:    Presents for breast and pelvic exam.  HSV-2 rare outbreaks, last one 3 months ago controlled with Valtrex. PAP and mammogram history normal. Postmenopausal, no HRT, no bleeding. Not sexually active. Recent back surgery, Dr. Garen Lah. Has been on oxycodone for greater than 5 years, problems with pain clinic and self admitted for detox for 8 days for opioid withdrawal. Recovering alcoholic. Currently taking Tramadol and Gabapentin with suboptimal pain relief. Librium per counselor. Dexa uptodate. Osteoporosis with back compression fractures on Prolia since 2013  Past medical history, past surgical history, family history and social history were all reviewed and documented in the EPIC chart. Lives alone with 2 cats, daughter with 3 grandchildren lives in [place], recently started to have better relationship with daughter, has neighbors, ex-husband who check on her  ROS:  A ROS was performed and pertinent positives and negatives are included.  Exam:  Filed Vitals:   11/19/14 1153  BP: 124/80    General appearance:  Normal Thyroid:  Symmetrical, normal in size, without palpable masses or nodularity. Respiratory  Auscultation:  Clear without wheezing or rhonchi Cardiovascular  Auscultation:  Regular rate, without rubs, murmurs or gallops  Edema/varicosities:  Not grossly evident Abdominal  Soft,nontender, without masses, guarding or rebound.  Liver/spleen:  No organomegaly noted  Hernia:  None appreciated  Skin  Inspection:  Grossly normal, back surgical site well healed   Breasts: Examined lying and sitting.     Right: Without masses, retractions, discharge or axillary adenopathy.     Left: Without masses, retractions, discharge or axillary adenopathy. Gentitourinary   Inguinal/mons:  Normal without inguinal adenopathy  External genitalia:  Normal  BUS/Urethra/Skene's glands:  Normal  Vagina:  Normal, atrophic  Cervix:   Normal  Uterus: anteverted, normal in size, shape and contour.  Midline and mobile  Adnexa/parametria:     Rt: Without masses or tenderness.   Lt: Without masses or tenderness.  Anus and perineum: Normal  Digital rectal exam: Normal sphincter tone without palpated masses or tenderness  Assessment/Plan:  64 y.o. DWF G1P1  for breast and pelvic exam  Osteoporosis, hx lumbar compression fx, on Prolia since 2013  HSV-2 rare outbreaks Postmenopausal, no HRT, bleeding Recovering opiate addiction/depression Labs-primary care Hypothyroid on Synthroid  Plan: Osteoporosis on Prolia, injection given today. Valtrex Rx 500 twice daily 3-5 days as needed.. Encouraged SBE, continue annual screening mammography, colonoscopy due instructed to schedule, and influenza vaccine. Recommended f/u with back surgeon. Labs drawn at PCP. Encouraged to participate in leisure activities, Tai Chi, and spend time with friends. Safety measure, healthy diet, ostium rich diet, exercise, and vitamin D 1000 daily supplementation reviewed.    [name], 12:49 PM 11/19/2014

## 2014-11-19 NOTE — Telephone Encounter (Addendum)
Approval received by phone call by kari. Reference number [id number] under Dr Phineas Real. Approved 11/19/14 thru 05/18/2015. Prolia injection given today. Benefits are no deductible(MET), 20% co insurance $5500 OOP max ($3262 met). With OV $45 copay with administration and OV. Approx cost to pt $200.00. Pt was informed by Wandra Scot [facility] assistant). Next injection due after 05/19/15.  Ok she is in the [employer] portal ! :)  Referral created on 06/28/14 under [place] end date 12/25/14  No visits used with [place] under past appts!   Note from Linus Salmons for PCP approval thru 12/25/14.

## 2014-11-19 NOTE — Addendum Note (Signed)
Addended by: Burnett Kanaris on: 11/19/2014 04:04 PM   Modules accepted: Orders

## 2014-11-23 NOTE — Telephone Encounter (Signed)
Injection given on 11/19/14. Next injection will be due after 05/20/15.  She will also need current Calcium level at time of injection

## 2014-11-24 DIAGNOSIS — F319 Bipolar disorder, unspecified: Secondary | ICD-10-CM | POA: Diagnosis not present

## 2014-11-24 DIAGNOSIS — F441 Dissociative fugue: Secondary | ICD-10-CM | POA: Diagnosis not present

## 2014-11-24 DIAGNOSIS — F4310 Post-traumatic stress disorder, unspecified: Secondary | ICD-10-CM | POA: Diagnosis not present

## 2014-12-08 DIAGNOSIS — F319 Bipolar disorder, unspecified: Secondary | ICD-10-CM | POA: Diagnosis not present

## 2014-12-08 DIAGNOSIS — F4310 Post-traumatic stress disorder, unspecified: Secondary | ICD-10-CM | POA: Diagnosis not present

## 2014-12-08 DIAGNOSIS — F441 Dissociative fugue: Secondary | ICD-10-CM | POA: Diagnosis not present

## 2014-12-23 DIAGNOSIS — Z6827 Body mass index (BMI) 27.0-27.9, adult: Secondary | ICD-10-CM | POA: Diagnosis not present

## 2014-12-23 DIAGNOSIS — M545 Low back pain: Secondary | ICD-10-CM | POA: Diagnosis not present

## 2014-12-23 DIAGNOSIS — M4316 Spondylolisthesis, lumbar region: Secondary | ICD-10-CM | POA: Diagnosis not present

## 2015-01-10 DIAGNOSIS — M4316 Spondylolisthesis, lumbar region: Secondary | ICD-10-CM | POA: Diagnosis not present

## 2015-01-10 DIAGNOSIS — Z6828 Body mass index (BMI) 28.0-28.9, adult: Secondary | ICD-10-CM | POA: Diagnosis not present

## 2015-01-10 DIAGNOSIS — R030 Elevated blood-pressure reading, without diagnosis of hypertension: Secondary | ICD-10-CM | POA: Diagnosis not present

## 2015-01-10 DIAGNOSIS — M5416 Radiculopathy, lumbar region: Secondary | ICD-10-CM | POA: Diagnosis not present

## 2015-01-10 DIAGNOSIS — M545 Low back pain: Secondary | ICD-10-CM | POA: Diagnosis not present

## 2015-01-19 DIAGNOSIS — F319 Bipolar disorder, unspecified: Secondary | ICD-10-CM | POA: Diagnosis not present

## 2015-01-19 DIAGNOSIS — F441 Dissociative fugue: Secondary | ICD-10-CM | POA: Diagnosis not present

## 2015-01-19 DIAGNOSIS — F4310 Post-traumatic stress disorder, unspecified: Secondary | ICD-10-CM | POA: Diagnosis not present

## 2015-01-20 DIAGNOSIS — Z1231 Encounter for screening mammogram for malignant neoplasm of breast: Secondary | ICD-10-CM | POA: Diagnosis not present

## 2015-01-24 ENCOUNTER — Encounter: Payer: Self-pay | Admitting: Women's Health

## 2015-01-26 DIAGNOSIS — E039 Hypothyroidism, unspecified: Secondary | ICD-10-CM | POA: Diagnosis not present

## 2015-02-17 DIAGNOSIS — M961 Postlaminectomy syndrome, not elsewhere classified: Secondary | ICD-10-CM | POA: Diagnosis not present

## 2015-02-17 DIAGNOSIS — M5416 Radiculopathy, lumbar region: Secondary | ICD-10-CM | POA: Diagnosis not present

## 2015-03-16 DIAGNOSIS — F441 Dissociative fugue: Secondary | ICD-10-CM | POA: Diagnosis not present

## 2015-03-16 DIAGNOSIS — F4310 Post-traumatic stress disorder, unspecified: Secondary | ICD-10-CM | POA: Diagnosis not present

## 2015-03-16 DIAGNOSIS — F319 Bipolar disorder, unspecified: Secondary | ICD-10-CM | POA: Diagnosis not present

## 2015-04-05 DIAGNOSIS — M545 Low back pain: Secondary | ICD-10-CM | POA: Diagnosis not present

## 2015-04-05 DIAGNOSIS — M961 Postlaminectomy syndrome, not elsewhere classified: Secondary | ICD-10-CM | POA: Diagnosis not present

## 2015-04-07 DIAGNOSIS — M533 Sacrococcygeal disorders, not elsewhere classified: Secondary | ICD-10-CM | POA: Diagnosis not present

## 2015-04-19 DIAGNOSIS — F441 Dissociative fugue: Secondary | ICD-10-CM | POA: Diagnosis not present

## 2015-04-19 DIAGNOSIS — F4310 Post-traumatic stress disorder, unspecified: Secondary | ICD-10-CM | POA: Diagnosis not present

## 2015-04-19 DIAGNOSIS — F319 Bipolar disorder, unspecified: Secondary | ICD-10-CM | POA: Diagnosis not present

## 2015-05-05 ENCOUNTER — Ambulatory Visit: Payer: Self-pay

## 2015-05-05 DIAGNOSIS — M533 Sacrococcygeal disorders, not elsewhere classified: Secondary | ICD-10-CM | POA: Diagnosis not present

## 2015-05-05 DIAGNOSIS — M5416 Radiculopathy, lumbar region: Secondary | ICD-10-CM | POA: Diagnosis not present

## 2015-05-05 DIAGNOSIS — M961 Postlaminectomy syndrome, not elsewhere classified: Secondary | ICD-10-CM | POA: Diagnosis not present

## 2015-05-05 DIAGNOSIS — M545 Low back pain: Secondary | ICD-10-CM | POA: Diagnosis not present

## 2015-05-06 ENCOUNTER — Other Ambulatory Visit: Payer: Self-pay

## 2015-05-06 NOTE — Patient Outreach (Signed)
[facility]) Care Management  05/06/2015  Victoria Levine 1950-07-14 [id number]  Referral Date:  05/04/2015 Source:  [employer] direct Issue:  Member interested in pain management options. Current meds: amitriptyline.   Outreach call to patient.  Patient not reached.  H/o PCP:  Dr. [name] H/o Behavior Health services active.   RN CM left HIPAA compliant voice message with name and number  RN CM scheduled for next outreach call within next 1-2 weeks.   [name], RN, BSN, [facility], CCM  [employer] Management Coordinator [phone] Direct [phone] Cell [phone] Office [phone] Fax

## 2015-05-09 ENCOUNTER — Ambulatory Visit: Payer: Self-pay

## 2015-05-09 ENCOUNTER — Other Ambulatory Visit: Payer: Self-pay

## 2015-05-09 NOTE — Patient Outreach (Addendum)
[place] [facility]) Care Management  05/09/2015  Victoria Levine Apr 03, 1950 [id number]  Telephone Screen  Referral Date: 05/04/2015 Source: [employer] direct Issue: Member interested in pain management options.     Providers: Primary MD:   Dr. [name] -  last appt:  Patient unable to recall last appt and has no scheduled appt's at this time.  Behavior Health services active with [facility] Counseling:  [name] - next appt 05/11/15.  [facility] and back surgeon and pain management:  Dr. Luan Pulling.  MD referred to [facility] but takes no insurance and charges  $14 per day.   Insurance: [employer]  Social: Mobility: Divorced and lives alone for about 10 years.  Falls:  Yes - 1 over the past year and sustained a concussion; tripped over a bathroom rug during the night.  Pain: yes - see below Bipolar: medication management.  States h/o 27 years of being active with AA services.  Transportation: yes - self Caregiver: None.  H/o a daughter with 3 children 7, 5 and 1 married to [name].   Support System:  friends, neighbors, [place] sponsor. DME:  [name],  back brace,  Tens Unit, reading glasses.   THN conditions:   None Other:  Chronic Pain with h/o past back surgeries.   Admissions and ER visits: NONE over past 6 months.  States h/o past chronic pain management with Oxycodone.  Patient states Dr. Luan Pulling will no longer prescribe medication/Oxycodone due to past issue with patient using incorrectly.  States Primary Care MD:  Dr. [name] will not treat pain.  Weight: 180.   States weight gain associated in inability to move due to pain issues.  Has pain management regime in place but does not include Oxycodone.   Medications:  Patient taking more than 10 medications  Co-pay cost issues:  No  Flu Vaccine: 2016 Pneumonia Vaccine:  2015   Medications Reviewed Today    Reviewed by Standley Brooking, RN (Registered  Nurse) on 05/09/15 at 1608  Med List Status: <None>   Medication Order Taking? Sig Documenting Provider Last Dose Status Informant   ALPRAZolam (XANAX) 0.5 MG tablet [id number] Yes TAKE 1 TO 1 & 1/2 TABLETS BY MOUTH ONCE DAILY AS NEEDED FOR ANXIETY Historical Provider, MD Taking Active              Med Note Burnett Kanaris   Fri Nov 19, 2014 11:47 AM): Received from: External Pharmacy    beclomethasone (QVAR) 80 MCG/ACT inhaler [id number] Yes Inhale 1 puff into the lungs as needed (sob). For shortness of breath Encarnacion Slates, NP Taking Active    calcium citrate-vitamin D (CALCIUM + D) 315-200 MG-UNIT per tablet [id number] No Take 1 tablet by mouth daily. For bone health  Patient not taking:  Reported on 05/09/2015   Encarnacion Slates, NP Not Taking Active    celecoxib (CELEBREX) 200 MG capsule [id number] No Take 1 capsule (200 mg total) by mouth 2 (two) times daily. For arthritic pain  Patient not taking:  Reported on 05/09/2015   Encarnacion Slates, NP Not Taking Active    chlordiazePOXIDE (LIBRIUM) 5 MG capsule [id number] No Take 3 capsules (15 mg total) by mouth 3 (three) times daily. For alcohol detox treatment  Patient not taking:  Reported on 05/09/2015   Encarnacion Slates, NP Not Taking Active    FLUoxetine (PROZAC) 40 MG capsule [id number] Yes Take 1 capsule (40 mg total) by mouth daily. For  depression Encarnacion Slates, NP Taking Active    gabapentin (NEURONTIN) 300 MG capsule [id number] Yes Take 1 capsule (300 mg total) by mouth 2 (two) times daily. For agitation/pain management Encarnacion Slates, NP Taking Active    levothyroxine (SYNTHROID) 125 MCG tablet [id number] Yes Take 1 tablet (125 mcg total) by mouth daily before breakfast. For low functioning thyroid Encarnacion Slates, NP Taking Active    Multiple Vitamin (MULTIVITAMIN) tablet [id number] Yes Take 1 tablet by mouth daily. For low vitamin Encarnacion Slates, NP Taking Active    Oxcarbazepine (TRILEPTAL) 300 MG tablet [id number] Yes Take 1 tablet (300 mg total) by  mouth 2 (two) times daily. Mood stabilization Encarnacion Slates, NP Taking Active    pantoprazole (PROTONIX) 40 MG tablet [id number] No Take 1 tablet (40 mg total) by mouth daily. For acid reflux  Patient not taking:  Reported on 05/09/2015   Encarnacion Slates, NP Not Taking Active    QUEtiapine (SEROQUEL) 50 MG tablet [id number] Yes Take 1 tablet by mouth daily. Historical Provider, MD Taking Active              Med Note Drema Dallas, [facility]   Fri Nov 19, 2014 11:47 AM): Received from: External Pharmacy Received Sig: TAKE 1 TO 2 TABLETS AT BEDTIME    ranitidine (ZANTAC) 300 MG tablet [id number] Yes Take 300 mg by mouth at bedtime. Historical Provider, MD Taking Active              Med Note Drema Dallas, [facility]   Fri Nov 19, 2014 11:47 AM): Received from: External Pharmacy    rizatriptan (MAXALT-MLT) 10 MG disintegrating tablet [id number] Yes as needed. Historical Provider, MD Taking Active              Med Note Drema Dallas, [facility]   Fri Nov 19, 2014 11:47 AM): Received from: External Pharmacy Received Sig: TAKE 1 TABLET BY MOUTH AS NEEDED MIGRAINE    tiotropium (SPIRIVA) 18 MCG inhalation capsule [id number] Yes Place 1 capsule (18 mcg total) into inhaler and inhale daily. For COPD Encarnacion Slates, NP Taking Active            Plan:  Referral Date: 05/04/2015 Screening:  05/09/2015 Telephonic RN CM start date:  05/09/2015 Program:  Other: Pain management:  05/09/2015  Patient eligible for [facility] services but does not meet eligibility for [facility] condition of DM, CHF, COPD.  Patient has care coordination need relating to Pain Management Services.   RN CM will provide care coordination services for request for assistance with getting Pain Management Clinic Referral due to issues with [facility] as stated above.  Patient confirmed interest in knowing more about options for the following:   [name] [address] [place], [place] [address]   [phone] [place] Referral  -more than 10  medications.   RN CM will contact [facility] to confirm if in network with [employer].  RN CM will contact Primary MD for the following: - last appt date - next appt date -request referral to the [facility] -schedule appt if needed.     RN CM notified [facility] Management Assistant: agreed to services/case opened. RN CM sent successful outreach letter and  [facility] Introductory package. RN CM scheduled next contact call within the next 30 days for Initial Assessment and Care Coordination services as needed. . RN CM advised to please notify MD of any changes in condition prior to scheduled appt's.   RN CM  provided contact name and # [phone] or main office # [phone] and 24-hour nurse line # [phone].  RN CM confirmed patient is aware of 911 services for urgent emergency needs.  [name], RN, BSN, [facility], CCM  [employer] Management Coordinator [phone] Direct [phone] Cell [phone] Office [phone] Fax

## 2015-05-10 ENCOUNTER — Other Ambulatory Visit: Payer: Self-pay

## 2015-05-10 NOTE — Patient Outreach (Signed)
[facility]) Care Management  05/10/2015  Victoria Levine May 01, 1950 [id number]  Care Coordination Services  Contact call to [facility] Bonna Gains, PhD, MD [address] [place] [address] [place], [place] [address] [phone] Contact: [place] confirmed last MD appt with [name] on 05/05/15. Next Appt:  None RN CM requested Dr. [name] send referral and medical records to  [facility] [address] [place], [place] [address]  [phone] [name] in-network: yes RN CM left name and number for call back if questions.   [name], RN, BSN, [facility], CCM  [employer] [employer] Management Coordinator [phone] Direct [phone] Cell [phone] Office [phone] Fax

## 2015-05-10 NOTE — Patient Outreach (Signed)
[facility]) Care Management  05/10/2015  Victoria Levine [birth year] [id number]   Care Coordination Services  RN CM contacted [name] [name], [place] [address]  [phone] RN CM confirmed provider is not in-network with [name].   Plan: RN CM will search for other options in-network.   [name], RN, BSN, [facility], CCM  [employer] Management Coordinator [phone] Direct [phone] Cell [phone] Office [phone] Fax

## 2015-05-10 NOTE — Patient Outreach (Signed)
[facility]) Care Management  05/10/2015  Victoria Levine [birth year] [id number]   Care Coordination Services   Contact call to patient to confirm and obtain consent for contacting Primary MD to request Pain Management Referral be sent to [place] (in-network provider with [facility]).  Patient agreed.    Plan:  RN CM will contact Dr. [name] with this update.   [name], RN, BSN, [facility], CCM  [employer] Management Coordinator [phone] Direct [phone] Cell [phone] Office [phone] Fax

## 2015-05-10 NOTE — Patient Outreach (Signed)
[facility]ia Levine [facility]) Care Management  05/10/2015  Victoria Levine 01/17/1951 [id number]  Care Coordination Services  RN CM contacted additional Pain Management Clinic: [facility] [address] [place], [place] [address]  [phone] Contact: Corene Cornea confirms in network with [employer].  Advised to have MD enter referral via [place].  Primary will need to also request [facility] Records be sent to [facility].  MD will review records for case consideration and will notify if not accepted and / or contact patient to schedule appt if accepted.    Plan:   RN CM will notify patient and Primary Care MD and request referral for services if patient agreeable.  [name], RN, BSN, [facility], CCM  [employer] Management Coordinator [phone] Direct [phone] Cell [phone] Office [phone] Fax

## 2015-05-10 NOTE — Patient Outreach (Signed)
[facility]) Care Management  05/10/2015  Victoria REYNOLD 17-Oct-1950 [id number]   Care Coordination Services  Contact call to patient.  RN CM provided update that Referral request sent to Primary MD as well as Dr. [name].  RN CM advised patient in next appt scheduled with Primary MD and provided lab / fasting instructions.  RN CM provided education on the importance of keeping all appts and yearly exam to follow routine care such as immunizations, flu, pneumonia vaccines, and lab work.   RN CM advised in next RN CM contact call within 2-3 weeks.   Patient may contact RN CM for questions/concerns prior to that call.   Patient has contact information for call back.   [name], RN, BSN, [facility], CCM  [employer] Management Coordinator [phone] Direct [phone] Cell [phone] Office [phone] Fax

## 2015-05-10 NOTE — Patient Outreach (Addendum)
Victoria Levine) Care Management  05/10/2015  Victoria Levine March 07, 1951 [id number]  Care Coordination Services  Telephone contact call to Dr. [name], [facility]  Pain Management Referral Request: RN CM requested Referral to  [facility] [address] [place], [place] [address]  [phone] Contact: Victoria Levine confirms in network with [employer].  Victoria Levine advised to have MD enter referral via [place].  Primary will need to also request [facility] Records be sent to [facility] (as patient is not able to do this since not established with [place] yet - per Victoria Levine).  MD will review records for case consideration and will notify if not accepted and / or contact patient to schedule appt if accepted.  Primary MD appt verification: Last appt:  01/26/2015 Next appt:   None RN CM requested and scheduled appt for annual visit - 05/10/2016 @1 :30.  Fasting for 6 hours prior.  Labs same day.   Plan:   -RN CM will notify [facility]:  Dr. [name] of request for medical records and referral to in-network provider with [employer]:  the [facility] [address] [place], [place] [address]  [phone] -RN CM will notify patient of new appt scheduled with Primary MD:  annual visit - 05/10/2016 @1 :30.  Fasting for 6 hours prior.  Labs same day.   Victoria Laster, RN, BSN, [facility], CCM  [employer] Management Coordinator [phone] Direct [phone] Cell [phone] Office [phone] Fax

## 2015-05-11 ENCOUNTER — Telehealth: Payer: Self-pay | Admitting: Gynecology

## 2015-05-11 DIAGNOSIS — F4310 Post-traumatic stress disorder, unspecified: Secondary | ICD-10-CM | POA: Diagnosis not present

## 2015-05-11 DIAGNOSIS — F319 Bipolar disorder, unspecified: Secondary | ICD-10-CM | POA: Diagnosis not present

## 2015-05-11 DIAGNOSIS — M810 Age-related osteoporosis without current pathological fracture: Secondary | ICD-10-CM

## 2015-05-11 DIAGNOSIS — F441 Dissociative fugue: Secondary | ICD-10-CM | POA: Diagnosis not present

## 2015-05-12 NOTE — Telephone Encounter (Signed)
Prolia due after 05/19/15

## 2015-05-13 ENCOUNTER — Ambulatory Visit: Payer: Self-pay

## 2015-05-17 ENCOUNTER — Encounter: Payer: Self-pay | Admitting: Gynecology

## 2015-05-17 NOTE — Telephone Encounter (Signed)
Calcium 9.9 10/11/2014  , PC to pt.  Insurance benefits. Total cost approx $465.00 ,   No deductible, No referral, co insurance 20%  ($420) OOPM $5900 ($90 met),   With or without OV co pay $45, total cost $465.00  PA needed Ref # [id number] valid 05/20/2015 thru 05/19/2017 [facility]  Medicare HMO  M81.0 Injection due after 05/19/15  Appointment for 05/23/2015 , also will need lab work.  Next injection due after 11/24/15.

## 2015-05-23 ENCOUNTER — Other Ambulatory Visit: Payer: Commercial Managed Care - HMO

## 2015-05-23 ENCOUNTER — Ambulatory Visit: Payer: Self-pay

## 2015-05-23 ENCOUNTER — Ambulatory Visit (INDEPENDENT_AMBULATORY_CARE_PROVIDER_SITE_OTHER): Payer: Commercial Managed Care - HMO | Admitting: Anesthesiology

## 2015-05-23 DIAGNOSIS — M810 Age-related osteoporosis without current pathological fracture: Secondary | ICD-10-CM

## 2015-05-23 LAB — CALCIUM: Calcium: 9.6 mg/dL (ref 8.6–10.4)

## 2015-05-23 MED ORDER — DENOSUMAB 60 MG/ML ~~LOC~~ SOLN
60.0000 mg | Freq: Once | SUBCUTANEOUS | Status: AC
  Administered 2015-05-23: 60 mg via SUBCUTANEOUS

## 2015-05-25 ENCOUNTER — Other Ambulatory Visit: Payer: Self-pay

## 2015-05-25 NOTE — Patient Outreach (Signed)
[facility]) Care Management  05/25/2015  Victoria Levine 05-Feb-1951 [id number]  Outreach call #1 to patient for initial assessment.  Patient states unable to take call at this time and will call RN CM back.  RN CM scheduled for next outreach call within one week.   [name], RN, BSN, [facility], CCM  [employer] Management Coordinator [phone] Direct [phone] Cell [phone] Office [phone] Fax

## 2015-05-26 ENCOUNTER — Ambulatory Visit: Payer: Self-pay

## 2015-05-27 ENCOUNTER — Other Ambulatory Visit: Payer: Self-pay

## 2015-05-27 NOTE — Patient Outreach (Addendum)
[facility]) Care Management  05/27/2015  EDIT RICCIARDELLI 28-Nov-1950 [id number]   Telephone Screen  Referral Date: 05/04/2015 Source: [place] direct Issue: Member interested in pain management options.  Assessment:    Providers: Primary MD: Dr. [name] - last appt:01/26/2015 / next appt 05/10/2016. Behavior Health services active with [facility] Counseling: [name] - last  appt 05/11/15 and next appt 07/21/15 [facility]: Dr. Clydell Hakim. MD referred to [facility] for pain management but takes no insurance and charges $14 per day.  Pain Management, Dr. [name] in the [facility]:  Last appt  05/05/15  Optometrist: Dr. [name] Last appt:  2014.   Dentist:  Last appt 2014 Insurance: [employer]  Social: Mobility: Divorced and lives alone for about 10 years.  Falls: Yes - 1 over the past year and sustained a concussion; tripped over a bathroom rug during the night.  Bipolar: medication management. States h/o 27 years of being active with AA services and has a sponsor.  Patient states she has cut back on her Prozac dosage by 1/2 the dose.  States she feels better and is not falling.  Patient taking Prozac 20 mg qd.  States she has cut back on the Trileptal by 1/2 the dos also and is only taking one dose per day.  Patient has not notified her providers she has modified her medication dosages.   Patient verbalized clear understanding that MD has not ordered Xanax or Librium.  States she has a "stock pile" of Librium from her past Alcohol Detox Program which she saved and has Xanax left over for old prescription order.  States she only takes when she needs to.  States for example, "If you told me you could not help me and was no longer going to keep me in this program; I would have to hang up and take a pill now."  States "you do not understand how bad my life has been in the past and what I  have been through." Transportation: yes - self Caregiver: None. H/o a daughter with 3 children 7, 5 and 1 married to [name] and a brother in [place] but does not call on them for any assistance.   Support System: friends, neighbors, [place] sponsor. Advanced Directives:  None but states interest in completing.  States most likely would choose her brother who lives in [place].  SS:  $1,953.00 a month + pension $900.00.  College loan forgiven due to poverty level per their assessment.  DME: [name], back brace, Tens Unit, Prescription and reading glasses.    THN conditions: None Other: Chronic Pain with h/o past back surgeries and osteoporosis.  Admissions and ER visits: NONE over past 6 months.  States h/o past chronic pain management with Oxycodone. Patient states Dr. [name] will no longer prescribe medication/Oxycodone due to past issue with patient using incorrectly. States Primary Care MD: Dr. [name] will not treat pain.  Weight: 180. States weight gain associated in inability to move due to pain issues. Has pain management regime in place but does not include Oxycodone.  Pain Management, Dr. [name] in the [facility]:  Last appt  05/05/15 and patient was advised nothing more they can do and no follow-up appt scheduled.   H/o [facility] RN CM requested Pain Management Referral be sent to [place] [facility] on 05/10/2015.  Patient states she has received no updates on a referral.   ASTHMA C/O no symptoms  or ASTHMA related issues.  Managed by Primary MD.   Medications:  Osteoporosis management:  Prolia Injections:  $500 co-pay  Co-pay went up from $200 to $500.  States injection cost $2,000.  Getting 2 injections a year.  Last injection was March 6 and due again approximately  11/2015.  Provider:  [facility], [place].  Patient is able to cover cost.     Current Medications:  Current Outpatient Prescriptions  Medication Sig  Dispense Refill  . ALPRAZolam (XANAX) 0.5 MG tablet TAKE 1 TO 1 & 1/2 TABLETS BY MOUTH ONCE DAILY AS NEEDED FOR ANXIETY  3  . beclomethasone (QVAR) 80 MCG/ACT inhaler Inhale 1 puff into the lungs as needed (sob). For shortness of breath 1 Inhaler 12  . chlordiazePOXIDE (LIBRIUM) 5 MG capsule Take 3 capsules (15 mg total) by mouth 3 (three) times daily. For alcohol detox treatment 63 capsule 0  . denosumab (PROLIA) 60 MG/ML SOLN injection Inject 60 mg into the skin every 6 (six) months. Administer in upper arm, thigh, or abdomen    . FLUoxetine (PROZAC) 40 MG capsule Take 1 capsule (40 mg total) by mouth daily. For depression 30 capsule 0  . gabapentin (NEURONTIN) 300 MG capsule Take 1 capsule (300 mg total) by mouth 2 (two) times daily. For agitation/pain management 60 capsule 0  . levothyroxine (SYNTHROID) 125 MCG tablet Take 1 tablet (125 mcg total) by mouth daily before breakfast. For low functioning thyroid 30 tablet 3  . methocarbamol (ROBAXIN) 500 MG tablet Take 500 mg by mouth.    . Multiple Vitamin (MULTIVITAMIN) tablet Take 1 tablet by mouth daily. For low vitamin    . naproxen sodium (ANAPROX) 220 MG tablet Take 220 mg by mouth 2 (two) times daily with a meal.    . ondansetron (ZOFRAN) 8 MG tablet Take 8 mg by mouth every 8 (eight) hours as needed for nausea or vomiting (Migraine Head Ache symptoms).    Marland Kitchen oxcarbazepine (TRILEPTAL) 600 MG tablet Take 600 mg by mouth daily.    . QUEtiapine (SEROQUEL) 50 MG tablet Take 1 tablet by mouth daily.  1  . ranitidine (ZANTAC) 300 MG tablet Take 300 mg by mouth at bedtime.  12  . rizatriptan (MAXALT-MLT) 10 MG disintegrating tablet as needed.  5  . tiotropium (SPIRIVA) 18 MCG inhalation capsule Place 1 capsule (18 mcg total) into inhaler and inhale daily. For COPD 30 capsule 12  . traMADol (ULTRAM) 50 MG tablet Take 50 mg by mouth every 6 (six) hours as needed for moderate pain.    . calcium citrate-vitamin D (CALCIUM + D) 315-200 MG-UNIT per tablet  Take 1 tablet by mouth daily. For bone health (Patient not taking: Reported on 05/09/2015)    . celecoxib (CELEBREX) 200 MG capsule Take 1 capsule (200 mg total) by mouth 2 (two) times daily. For arthritic pain (Patient not taking: Reported on 05/09/2015)    . Oxcarbazepine (TRILEPTAL) 300 MG tablet Take 1 tablet (300 mg total) by mouth 2 (two) times daily. Mood stabilization (Patient not taking: Reported on 05/27/2015) 60 tablet 0  . pantoprazole (PROTONIX) 40 MG tablet Take 1 tablet (40 mg total) by mouth daily. For acid reflux (Patient not taking: Reported on 05/09/2015)     No current facility-administered medications for this visit.    Functional Status:  In your present state of health, do you have any difficulty performing the following activities: 05/27/2015 10/11/2014  Hearing? N N  Vision? N N  Difficulty concentrating or  making decisions? N N  Walking or climbing stairs? Y Y  Dressing or bathing? N N  Doing errands, shopping? N -  Preparing Food and eating ? N -  Using the Toilet? N -  In the past six months, have you accidently leaked urine? N -  Do you have problems with loss of bowel control? N -  Managing your Medications? N -  Managing your Finances? N -  Housekeeping or managing your Housekeeping? N -  Some encounter information is confidential and restricted. Go to Review Flowsheets activity to see all data.    Fall/Depression Screening: PHQ 2/9 Scores 05/27/2015 05/09/2015  PHQ - 2 Score 0 0   Fall Risk  05/27/2015 05/09/2015  Falls in the past year? Yes Yes  Number falls in past yr: 1 1  Injury with Fall? Yes Yes  Risk Factor Category  High Fall Risk High Fall Risk  Risk for fall due to : History of fall(s);Impaired mobility;Medication side effect History of fall(s);Impaired balance/gait;Medication side effect  Follow up Falls evaluation completed;Education provided;Falls prevention discussed Falls evaluation completed;Education provided;Falls prevention discussed        Plan:  Referral Date: 05/04/2015 Screening: 05/09/2015 Telephonic RN CM start date: 05/09/2015 Program: Other: Pain management: 05/09/2015 Initial Assessment:  05/27/2015 RN CM sent Dr. [name] physician barriers letter and Initial assessment.   [facility] Referral sent 05/09/2015 -more than 10 medications.   Pain Management: RN CM will follow-up to determine if referral was made to Pain Management Clinic Referral Request / MD notified 05/10/2015 [facility] [address] [place], [place] [address]  [phone] [name] in-network: yes  Advanced Directives: RN CM mailed copy of document for patient to review.  RN CM will discuss again on next contact call after patient has reviewed.   [facility] CM Care Plan Problem One        Most Recent Value   Care Plan Problem One  Chronic Pain Management    Role Documenting the Problem One  Care Management Telephonic Coordinator   Care Plan for Problem One  Active   THN Long Term Goal (31-90 days)  Patient will adhere to MD recommendations over the next 31-90 days.    THN Long Term Goal Start Date  05/09/15   THN Long Term Goal Met Date  -- [Goal continued:  RN CM advised patient must adhere to prescr]   Interventions for Problem One Long Term Goal  RN CM will provide education on improtance of compliance to MD recommendations and orders over the next 31-90 days.    THN CM Short Term Goal #1 (0-30 days)  Patient will engagge in options for comprehensive pain mangement centers over the next 30 days.    THN CM Short Term Goal #1 Start Date  05/09/15   [facility] CM Short Term Goal #1 Met Date  -- [Goal continued:  Patient has received no updates from MD reg]   Interventions for Short Term Goal #1  RN CM will provide care coordination services for possible pain clinic referral over the next 30 days.     THN CM Care Plan Problem Two        Most Recent Value   Care Plan Problem Two  Fall Risk with h/o 1  fall with injury over the past year.    Role Documenting the Problem Two  Care Management Telephonic Coordinator   Care Plan for Problem Two  Active   [facility] CM Short Term  Goal #1 (0-30 days)  Patient will report no falls over the next 30 days.    THN CM Short Term Goal #1 Start Date  05/09/15   THN CM Short Term Goal #1 Met Date   -- [Goal continued]   Interventions for Short Term Goal #2   RN CM will provide fall risk education over the next 30 days.     THN CM Care Plan Problem Three        Most Recent Value   Care Plan Problem Three  Medications:  h/o past non-adherence to pain medication orders.    Role Documenting the Problem Three  Care Management Telephonic Coordinator   Care Plan for Problem Three  Active   THN CM Short Term Goal #1 (0-30 days)  Patient will report compliance with all medication orders over the next 30 days.    THN CM Short Term Goal #1 Start Date  05/09/15   THN CM Short Term Goal #1 Met Date  -- [Goal continued: patient remains non-adherent to medication o]   Interventions for Short Term Goal #1  RN CM will discuss patient accountability with taking medications as ordered over the next 30 days.        Follow-up:   RN CM scheduled next contact call within the next 30 days for Telephonic Monthly Assessment and Care Coordination services as needed. . RN CM advised to please notify MD of any changes in condition prior to scheduled appt's.  RN CM provided contact name and # [phone] or main office # [phone] and 24-hour nurse line # [phone].  RN CM confirmed patient is aware of 911 services for urgent emergency needs.  [name], RN, BSN, [facility], CCM  [employer] Management Coordinator [phone] Direct [phone] Cell [phone] Office [phone] Fax

## 2015-05-31 NOTE — Telephone Encounter (Addendum)
Received second note from [facility] with [place] ref # [id number], dated 05/17/15 will confirm, [employer] rep Asencion Gowda never confirmed this ref number

## 2015-06-16 ENCOUNTER — Other Ambulatory Visit: Payer: Self-pay

## 2015-06-16 NOTE — Patient Outreach (Signed)
[place] [facility]) Care Management  06/16/2015  Victoria Levine [birth year] [id number]   Care Coordination Services  Contact call to [facility]  Issue:  Follow-up on progress of Pain Management Referral sent on 05/10/2015. Office contact confirms received and under medical review which can take 4-6 weeks.  RN CM will update patient on next contact call.   [name], RN, BSN, [facility], CCM  [employer] Management Coordinator [phone] Direct [phone] Cell [phone] Office [phone] Fax

## 2015-06-24 ENCOUNTER — Other Ambulatory Visit: Payer: Self-pay

## 2015-06-27 ENCOUNTER — Other Ambulatory Visit: Payer: Self-pay

## 2015-06-27 VITALS — Wt 180.0 lb

## 2015-06-27 DIAGNOSIS — F411 Generalized anxiety disorder: Secondary | ICD-10-CM

## 2015-06-27 NOTE — Patient Outreach (Addendum)
[facility]) Care Management  06/27/2015  Victoria Levine March 01, 1951 [id number]  Telephone Monthly Assessment  Referral Date: 05/04/2015 Source: [place] direct Issue: Member interested in pain management options.  Subjective:  Patient states she has not heard anything back from the Pain Management Referral.  States it is getting near the time to get her medications refilled.  Patient asking if she should seek out another center and states concern that the center will not accept her as a patient.    Providers: Primary MD: Dr. [name] - last appt:01/26/2015 / next appt 05/10/2016. [facility]:  [name], NP -  last appt:  11/19/14 Behavior Health services active with [facility] Counseling: [name] - last appt 05/11/15 and next appt 07/21/15 Optometrist: Dr. [name] Last appt: 2014.  Dentist: Last appt 2014 Insurance: [employer] Gold Plus [employer]  Social: Mobility: Divorced and lives alone for about 10 years.  Falls: No new falls reported since 05/09/15 screening call.  H/o 1 over the past year and sustained a concussion; tripped over a bathroom rug during the night.  (patient unable to recall the date of fall).  C/o decreased muscle strength due to deconditioning associated to lack of activity due to pain. States she is able to still run errands to manage details of her life but not without much pain.  States she has very poor quality of lift at this time due to pain.   Bipolar, Anxiety:   States h/o 27 years of being active with AA services and had a sponsor/[name] but sponsor stopped providing support on previous Psych Admission when sponsor discovered that patient had been abusing Oxycodone.  Patient states she no longer attends AA but does attend NA now. Patient reported on 05/27/15 that she had cut back on her Prozac dosage by 1/2 the dose and is only taking 20 mg every day rather than prescribed 40 mg. States she feels  better and is not falling and is doing this out of fear the pain management center will not take her.  Patient reported on 05/27/15 she had also cut back on the Trileptal by 1/2 the dose also and is only taking one dose per day.  Patient has still not notified her providers she has modified her medication dosages.  Patient verbalized clear understanding that MD has not ordered Xanax or Librium.   Patient reported on 05/27/15 she had a "stock pile" of Librium from her past Alcohol Detox Program which she saved and has Xanax left over for old prescription order and only  takes when she needs to.  Patient gave example, "If you told me you could not help me and was no longer going to keep me in this program; I would have to hang up and take a pill now. You do not understand how bad my life has been in the past and what I have been through."  Transportation: yes - self Caregiver: None. H/o a daughter with 3 children 7, 5 and 1 married to [name] and a brother in [place] but does not call on them for any assistance.   Support System: friends, neighbors. Advanced Directives: None but states interest in completing. States most likely would choose her brother who lives in [place] but has not discussed with him yet.  States she will contact him today 06/27/15.   SS: $1,953.00 a month + pension $900.00. College loan forgiven due to poverty level per their assessment.  DME: [name], back brace, Tens Unit, Prescription  and reading glasses.   THN conditions: None Other: Chronic Pain -H/o past Opioid abuse with opioid-induced mood disorder (2016) -H/o past Benzodiazepine dependence (2016) -Osteoporosis with back compression fractures on Prolia since 2013. -Spondylolisthesis of lumbar region -H/o Back Surgery 02/05/2010 -H/o a fall when she fell with a leaf blower and rolled 3 times in 2011.  -H/o past chronic pain management with Oxycodone but no longer prescribed.  [place]  Neurological and back surgeon: Dr. [name] referred to [facility] for pain management but takes no insurance and charges are $14 per day. Patient states this is not affordable.  Pain Management, Dr. [name] in the [facility]: Last appt 05/05/15  Patient states Dr. [name] will no longer prescribe medication/Oxycodone due to past issue with patient using incorrectly. Pain Management, Dr. [name] in the [facility]: Last appt 05/05/15 and patient was advised nothing more they can do and no follow-up appt scheduled.   Primary Care MD: Dr. [name] will not treat pain.  Dr. [name] sent Pain Management Clinic Referral on 05/10/2015.   [facility]).  H/o RN CM last contacted Clinic on 06/16/15 to check on progress.  Office verified received and under medical review which can take 4-6 weeks.  Weight: 180.   ASTHMA C/O no symptoms or ASTHMA related issues. Managed by Primary MD due to C/O that Specialty co-pay was $60.00 and unaffordable.   Medications:  Osteoporosis management: Prolia Injections: $500 co-pay Co-pay went up from $200 to $500. States injection cost $2,000. Getting 2 injections a year. Last injection was 05/23/15 and due again approximately 11/2015.  Provider: [facility], [place]. Patient is able to cover cost.    Encounter Medications:  Outpatient Encounter Prescriptions as of 06/27/2015  Medication Sig Note  . beclomethasone (QVAR) 80 MCG/ACT inhaler Inhale 1 puff into the lungs as needed (sob). For shortness of breath   . denosumab (PROLIA) 60 MG/ML SOLN injection Inject 60 mg into the skin every 6 (six) months. Administer in upper arm, thigh, or abdomen   . FLUoxetine (PROZAC) 40 MG capsule Take 1 capsule (40 mg total) by mouth daily. For depression 05/27/2015: Patient taking 20 mg qd and has not notified her prescribing MD. 05/27/2015  . gabapentin (NEURONTIN) 300 MG capsule Take 1 capsule (300 mg total) by mouth 2  (two) times daily. For agitation/pain management 06/27/2015: Patient reported on 06/27/2015 she is taking 600 mg 3 times a day if needed but states only taking twice a day.   . levothyroxine (SYNTHROID) 125 MCG tablet Take 1 tablet (125 mcg total) by mouth daily before breakfast. For low functioning thyroid   . methocarbamol (ROBAXIN) 500 MG tablet Take 500 mg by mouth.   . Multiple Vitamin (MULTIVITAMIN) tablet Take 1 tablet by mouth daily. For low vitamin   . ondansetron (ZOFRAN) 8 MG tablet Take 8 mg by mouth every 8 (eight) hours as needed for nausea or vomiting (Migraine Head Ache symptoms).   Marland Kitchen oxcarbazepine (TRILEPTAL) 600 MG tablet Take 600 mg by mouth daily.   . QUEtiapine (SEROQUEL) 50 MG tablet Take 1 tablet by mouth daily. 11/19/2014: Received from: External Pharmacy Received Sig: TAKE 1 TO 2 TABLETS AT BEDTIME  . ranitidine (ZANTAC) 300 MG tablet Take 300 mg by mouth at bedtime. 11/19/2014: Received from: External Pharmacy  . rizatriptan (MAXALT-MLT) 10 MG disintegrating tablet as needed. 11/19/2014: Received from: External Pharmacy Received Sig: TAKE 1 TABLET BY MOUTH AS NEEDED MIGRAINE  . tiotropium (SPIRIVA) 18 MCG inhalation capsule  Place 1 capsule (18 mcg total) into inhaler and inhale daily. For COPD 05/27/2015: States out of this medication and has not refilled due to not needed 05/27/2015  . valACYclovir (VALTREX) 500 MG tablet Take 500 mg by mouth 2 (two) times daily.   Marland Kitchen ALPRAZolam (XANAX) 0.5 MG tablet Reported on 06/27/2015 05/27/2015: Patient states no active Rx order but has left over medication from the past which she uses when needed  05/27/2015.    . calcium citrate-vitamin D (CALCIUM + D) 315-200 MG-UNIT per tablet Take 1 tablet by mouth daily. For bone health (Patient not taking: Reported on 05/09/2015) 05/27/2015: Patient not taking 05/27/2015  . celecoxib (CELEBREX) 200 MG capsule Take 1 capsule (200 mg total) by mouth 2 (two) times daily. For arthritic pain (Patient not taking:  Reported on 05/09/2015) 05/27/2015: Patient not taking. 05/27/2015  . chlordiazePOXIDE (LIBRIUM) 5 MG capsule Take 3 capsules (15 mg total) by mouth 3 (three) times daily. For alcohol detox treatment (Patient not taking: Reported on 06/27/2015) 05/27/2015: Patient states she has a "stock pile" from alcohol detox program but has no active order for this Rx.  States uses as needed to replace Xanax. 05/27/2015  . naproxen sodium (ANAPROX) 220 MG tablet Take 220 mg by mouth 2 (two) times daily with a meal. Reported on 06/27/2015   . Oxcarbazepine (TRILEPTAL) 300 MG tablet Take 1 tablet (300 mg total) by mouth 2 (two) times daily. Mood stabilization (Patient not taking: Reported on 05/27/2015) 05/27/2015: Patient not taking: new dosage order.   . pantoprazole (PROTONIX) 40 MG tablet Take 1 tablet (40 mg total) by mouth daily. For acid reflux (Patient not taking: Reported on 05/09/2015)   . traMADol (ULTRAM) 50 MG tablet Take 50 mg by mouth every 6 (six) hours as needed for moderate pain. Reported on 06/27/2015 06/27/2015: Patient not taking    No facility-administered encounter medications on file as of 06/27/2015.    Functional Status:  In your present state of health, do you have any difficulty performing the following activities: 06/28/2015 05/27/2015  Hearing? N N  Vision? N N  Difficulty concentrating or making decisions? N N  Walking or climbing stairs? Y Y  Dressing or bathing? N N  Doing errands, shopping? N N  Preparing Food and eating ? N N  Using the Toilet? N N  In the past six months, have you accidently leaked urine? N N  Do you have problems with loss of bowel control? N N  Managing your Medications? N N  Managing your Finances? N N  Housekeeping or managing your Housekeeping? N N    Fall/Depression Screening: PHQ 2/9 Scores 05/27/2015 05/09/2015  PHQ - 2 Score 0 0    Assessment: Patient remains adherent to engaging with [facility].   Patient seeking pain management through correct  channels.  Patient lacks clear understanding of reason she needs to remain compliant with meds and not self manage without discussing with her MDs first in order to achieve best clinical response.    Plan:  Referral Date: 05/04/2015 Screening: 05/09/2015 Telephonic RN CM start date: 05/09/2015 Program: Other: Pain management: 05/09/2015 Initial Assessment: 05/27/2015  Pain Management: RN CM will follow-up on progress of Pain Management Clinic medical record review.  RN CM reviewed past possibilities of pain clinics:  -[facility] for pain management but takes no insurance and charges are $14 per day. Patient states this is not affordable. -[name] on 05/10/15 [phone]- provider is not in-network with [facility]  Gold Plus plan and patient did not feel this was affordable.  -[facility]  [phone] - [employer] Gold in-network: yes.  Currently under review.   Bipolar and Medications: RN CM advised to take medications as ordered and patient should not change dosages without discussing with her prescribing MD first.  Advised that patient should not fear the Pain Clinic would not accept her for taking prescribed medications for her Bipolar management.  Advised that all MDs expect patients to take all medications as prescribed.   Fall Risk RN CM provided verbal education on fall risk and advised in safety tips, fall prevention education and reporting any falls to MD.  Advanced Directives: RN CM will discuss again on next contact call after patient follows-up with brother for confirmation on agreement to act as HCPOA.    RN CM scheduled next contact call within the next 30 days for Telephonic Monthly Assessment and Care Coordination services as needed. . RN CM advised to please notify MD of any changes in condition prior to scheduled appt's.  RN CM provided contact name and # [phone] or main office # [phone] and 24-hour nurse line #  [phone].  RN CM confirmed patient is aware of 911 services for urgent emergency needs.  [name], RN, BSN, [facility], CCM  [employer] Management Coordinator [phone] Direct [phone] Cell [phone] Office [phone] Fax

## 2015-06-28 DIAGNOSIS — F411 Generalized anxiety disorder: Secondary | ICD-10-CM | POA: Insufficient documentation

## 2015-06-28 NOTE — Addendum Note (Signed)
Addended by: Standley Brooking on: 06/28/2015 11:06 AM   Modules accepted: Medications

## 2015-07-21 DIAGNOSIS — F319 Bipolar disorder, unspecified: Secondary | ICD-10-CM | POA: Diagnosis not present

## 2015-07-21 DIAGNOSIS — F4310 Post-traumatic stress disorder, unspecified: Secondary | ICD-10-CM | POA: Diagnosis not present

## 2015-07-21 DIAGNOSIS — F441 Dissociative fugue: Secondary | ICD-10-CM | POA: Diagnosis not present

## 2015-07-26 ENCOUNTER — Other Ambulatory Visit: Payer: Self-pay

## 2015-07-26 DIAGNOSIS — G8929 Other chronic pain: Secondary | ICD-10-CM | POA: Insufficient documentation

## 2015-07-26 DIAGNOSIS — R52 Pain, unspecified: Secondary | ICD-10-CM | POA: Insufficient documentation

## 2015-07-26 NOTE — Patient Outreach (Signed)
[facility]) Care Management  07/26/2015  Victoria Levine 1950-12-13 [id number]  Telephone Monthly Assessment  Referral Date: 05/04/2015 Source: [place] direct Issue: Member interested in pain management options.  Subjective:  Patient states she is frustrated with the length of time it has taken to determine if the [facility] will take her as a patient.   Providers: Primary MD: Dr. [name] - last appt:01/26/2015 / next appt 05/10/2016. [name]: [name], NP - last appt: 11/19/14 Behavior Health services active with [name] Counseling: [name] - last appt: 07/20/2015 Optometrist: Dr. [name] 2014.  Dentist:2014 Insurance: [place]:  Telephonic Nurse call; contact: [name]  Social: Mobility: Divorced and lives alone for about 10 years.  Falls: No new falls reported since 05/09/15 screening call. H/o 1 over the past year and sustained a concussion; tripped over a bathroom rug during the night. (patient unable to recall the date of fall). C/o decreased muscle strength due to deconditioning associated to lack of activity due to pain. Patient is still able to run errands to manage details of her life but not without pain. States very poor quality of lift at this time due to pain.   Bipolar, Anxiety: States h/o 27 years of being active with AA services and had a sponsor/[name] but sponsor stopped providing support on previous Psych Admission when sponsor discovered that patient had been abusing Oxycodone. Patient states she no longer attends AA but does attend NA now.   Patient verbalized clear understanding that MD has not ordered Xanax or Librium.  Patient reported on 05/27/15 she had a "stock pile" of Librium from her past Alcohol Detox Program which she saved and has Xanax left over for old prescription order and only takes when she  needs to.H/o Patient gave example, "If you told me you could not help me and was no longer going to keep me in this program; I would have to hang up and take a pill now. You do not understand how bad my life has been in the past and what I have been through." Transportation: yes - self Caregiver: None. H/o a daughter with 3 children 7, 5 and 1 married to [name] and a brother in [place] but does not call on them for any assistance.   Support System: friends, neighbors. Advanced Directives: None but states interest in completing. States most likely would choose her brother who lives in [name] but has not discussed with him yet. Patient reported on 06/27/15 she would contact her brother to confirm plan. Patient provided no further updates this call due to focused on pain clinic denial.   SS: $1,953.00 a month + pension $900.00. College loan forgiven due to poverty level per their assessment.  DME: [name], back brace, Tens Unit, Prescription and reading glasses.   THN conditions: None Other: Chronic Pain -H/o past Opioid abuse with opioid-induced mood disorder (2016) -H/o past Benzodiazepine dependence (2016) -Osteoporosis with back compression fractures on Prolia since 2013. -Spondylolisthesis of lumbar region -H/o Back Surgery 02/05/2010 -H/o a fall when she fell with a leaf blower and rolled 3 times in 2011.  -H/o past chronic pain management with Oxycodone but no longer prescribed. (States she has not been on any Oxycodone since out of treatment August 2016).   H/o [facility]: Dr. [name] referred to [facility] for pain management but takes no insurance and charges are $14 per day. Patient states this  is not affordable. Pain Management, Dr. [name] in the [facility]: Last appt 05/05/15.  H/o patient reported Dr. [name] will no longer prescribe medication/Oxycodone due to past issue with patient using incorrectly.  H/o Dr. [name]  in the [name]: Last appt 05/05/15 advised nothing more they can do and no further  follow-up appt's scheduled. H/o Primary Care MD: Dr. [name] advised patient she will not treat pain. Dr. [name] sent Referral on 05/10/2015 to [place] [phone]).  -RN CM provided patient update today that referral was reviewed and denied on 07/22/2015: "Nothing to offer this patient".  Patient is upset and states plans to search out a new option.  States [name], [name] has provided her with a list of providers but she has not decided where she will try to go yet.  Patient states plan to contact [facility] to request copies of her medical record they used for review for services.  States she does not understand why they will not take her.  Patient states her [facility] counselor, [name] has advised patient to seek pain management outside the [facility] due to having the diagnosis of oxycodone and xanax dependent.    ASTHMA C/O no symptoms or ASTHMA related issues. H/o Managed by Primary MD due to Specialty co-pay $60.00 and unaffordable.   Medications:  Osteoporosis management: Prolia Injections: 2 injections a year. Last injection was 05/23/15 and due again approximately 11/2015.  Provider: [name], [name]. Patient is able to cover cost.  Medication reconciliation:  Unable to complete this call, patient too upset about pain management denial.     Encounter Medications:  Outpatient Encounter Prescriptions as of 07/26/2015  Medication Sig Note  . ALPRAZolam (XANAX) 0.5 MG tablet Reported on 06/27/2015 05/27/2015: Patient states no active Rx order but has left over medication from the past which she uses when needed  05/27/2015.    . beclomethasone (QVAR) 80 MCG/ACT inhaler Inhale 1 puff into the lungs as needed (sob). For shortness of breath   . calcium citrate-vitamin D (CALCIUM + D) 315-200 MG-UNIT per tablet Take  1 tablet by mouth daily. For bone health (Patient not taking: Reported on 05/09/2015) 05/27/2015: Patient not taking 05/27/2015  . celecoxib (CELEBREX) 200 MG capsule Take 1 capsule (200 mg total) by mouth 2 (two) times daily. For arthritic pain (Patient not taking: Reported on 05/09/2015) 05/27/2015: Patient not taking. 05/27/2015  . chlordiazePOXIDE (LIBRIUM) 5 MG capsule Take 3 capsules (15 mg total) by mouth 3 (three) times daily. For alcohol detox treatment (Patient not taking: Reported on 06/27/2015) 05/27/2015: Patient states she has a "stock pile" from alcohol detox program but has no active order for this Rx.  States uses as needed to replace Xanax. 05/27/2015  . denosumab (PROLIA) 60 MG/ML SOLN injection Inject 60 mg into the skin every 6 (six) months. Administer in upper arm, thigh, or abdomen   . FLUoxetine (PROZAC) 40 MG capsule Take 1 capsule (40 mg total) by mouth daily. For depression 05/27/2015: Patient taking 20 mg qd and has not notified her prescribing MD. 05/27/2015  . gabapentin (NEURONTIN) 300 MG capsule Take 1 capsule (300 mg total) by mouth 2 (two) times daily. For agitation/pain management 06/27/2015: Patient reported on 06/27/2015 she is taking 600 mg 3 times a day if needed but states only taking twice a day.   . levothyroxine (SYNTHROID) 125 MCG tablet Take 1 tablet (125 mcg total) by mouth daily before breakfast. For low  functioning thyroid   . methocarbamol (ROBAXIN) 500 MG tablet Take 500 mg by mouth.   . Multiple Vitamin (MULTIVITAMIN) tablet Take 1 tablet by mouth daily. For low vitamin   . naproxen sodium (ANAPROX) 220 MG tablet Take 220 mg by mouth 2 (two) times daily with a meal. Reported on 06/27/2015   . ondansetron (ZOFRAN) 8 MG tablet Take 8 mg by mouth every 8 (eight) hours as needed for nausea or vomiting (Migraine Head Ache symptoms).   . Oxcarbazepine (TRILEPTAL) 300 MG tablet Take 1 tablet (300 mg total) by mouth 2 (two) times daily. Mood stabilization (Patient not taking:  Reported on 05/27/2015) 05/27/2015: Patient not taking: new dosage order.   Marland Kitchen oxcarbazepine (TRILEPTAL) 600 MG tablet Take 600 mg by mouth daily.   . pantoprazole (PROTONIX) 40 MG tablet Take 1 tablet (40 mg total) by mouth daily. For acid reflux (Patient not taking: Reported on 05/09/2015)   . QUEtiapine (SEROQUEL) 50 MG tablet Take 1 tablet by mouth daily. 11/19/2014: Received from: External Pharmacy Received Sig: TAKE 1 TO 2 TABLETS AT BEDTIME  . ranitidine (ZANTAC) 300 MG tablet Take 300 mg by mouth at bedtime. 11/19/2014: Received from: External Pharmacy  . rizatriptan (MAXALT-MLT) 10 MG disintegrating tablet as needed. 11/19/2014: Received from: External Pharmacy Received Sig: TAKE 1 TABLET BY MOUTH AS NEEDED MIGRAINE  . tiotropium (SPIRIVA) 18 MCG inhalation capsule Place 1 capsule (18 mcg total) into inhaler and inhale daily. For COPD 05/27/2015: States out of this medication and has not refilled due to not needed 05/27/2015  . traMADol (ULTRAM) 50 MG tablet Take 50 mg by mouth every 6 (six) hours as needed for moderate pain. Reported on 06/27/2015 06/27/2015: Patient not taking   . valACYclovir (VALTREX) 500 MG tablet Take 500 mg by mouth 2 (two) times daily.    No facility-administered encounter medications on file as of 07/26/2015.    Functional Status:  In your present state of health, do you have any difficulty performing the following activities: 06/28/2015 05/27/2015  Hearing? N N  Vision? N N  Difficulty concentrating or making decisions? N N  Walking or climbing stairs? Y Y  Dressing or bathing? N N  Doing errands, shopping? N N  Preparing Food and eating ? N N  Using the Toilet? N N  In the past six months, have you accidently leaked urine? N N  Do you have problems with loss of bowel control? N N  Managing your Medications? N N  Managing your Finances? N N  Housekeeping or managing your Housekeeping? N N    Fall/Depression Screening: PHQ 2/9 Scores 05/27/2015 05/09/2015  PHQ - 2 Score  0 0    Fall Risk  06/28/2015 05/27/2015 05/09/2015  Falls in the past year? Yes Yes Yes  Number falls in past yr: 1 1 1   Injury with Fall? Yes Yes Yes  Risk Factor Category  High Fall Risk High Fall Risk High Fall Risk  Risk for fall due to : History of fall(s);Impaired balance/gait;Impaired mobility;Medication side effect History of fall(s);Impaired mobility;Medication side effect History of fall(s);Impaired balance/gait;Medication side effect  Follow up Falls evaluation completed;Education provided;Falls prevention discussed Falls evaluation completed;Education provided;Falls prevention discussed Falls evaluation completed;Education provided;Falls prevention discussed     Assessment: Patient remains adherent to engaging with [facility]. Patient seeking pain management through correct channels. H/o Patient lacks clear understanding of reason she needs to remain compliant with meds and not self manage without discussing with her MDs first in order  to achieve best clinical response. RN CM unable to discuss this issue further this call as patient is upset over the pain clinic referral.    Plan:  Referral Date: 05/04/2015 Screening: 05/09/2015 Telephonic RN CM start date: 05/09/2015 Program: Other: Pain management: 05/09/2015 Initial Assessment: 05/27/2015  Pain Management: RN CM will follow-up on progress of Pain Management Clinic medical record review.  RN CM reviewed past possibilities of pain clinics:  -[facility] for pain management but takes no insurance and charges are $14 per day. Patient states this is not affordable. -[name] on 05/10/15 [phone]) [phone]- provider is not in-network with [employer] plan and patient did not feel this was affordable.  [facility] [phone] - [employer] in-network: yes. Referral reviewed and denied 5/52017. -RN CM discussed option of other providers and option to go out of network  as a patient right.  Patient stated she is not able to afford to go out of network and this is not an option.  -RN CM encouraged patient to discuss with Primary MD to assist with any other referral needs to other pain clinics.   Medications: RN CM will follow-up with patient within next 2 weeks to complete medication reconciliation.    Advanced Directives: RN CM will discuss again on next contact call after patient follows-up with brother for confirmation on agreement to act as HCPOA.   RN CM scheduled next contact call within the next 30 days for Telephonic Monthly Assessment and Care Coordination services as needed. . RN CM advised to please notify MD of any changes in condition prior to scheduled appt's.  RN CM provided contact name and # [phone] or main office # [phone] and 24-hour nurse line # [phone].  RN CM confirmed patient is aware of 911 services for urgent emergency needs.  [name], RN, BSN, [facility], CCM  [employer] Management Coordinator [phone] Direct [phone] Cell [phone] Office [phone] Fax

## 2015-07-26 NOTE — Patient Outreach (Addendum)
[facility]) Care Management  07/26/2015  MATISYN POST February 08, 1951 [id number]   Care Coordination Services  Contact call to [facility]  Issue: Follow-up on progress of Pain Management Referral sent on 05/10/2015. Office Contact:  Corene Cornea.   Referral reviewed.  Confirms determination 07/22/15:  "Referral denied; nothing to offer this patient."  Plan:  RN CM will provide update to patient.   [name], RN, BSN, [facility], CCM  [employer] Management Coordinator [phone] Direct [phone] Cell [phone] Office [phone] Fax

## 2015-07-27 DIAGNOSIS — J45901 Unspecified asthma with (acute) exacerbation: Secondary | ICD-10-CM | POA: Diagnosis not present

## 2015-08-02 ENCOUNTER — Ambulatory Visit: Payer: Self-pay

## 2015-08-03 ENCOUNTER — Other Ambulatory Visit: Payer: Self-pay

## 2015-08-03 NOTE — Patient Outreach (Signed)
[facility]) Care Management  08/03/2015  Victoria Levine [birth year] [id number]  Telephone Monthly Assessment  Referral Date: 05/04/2015 Source: [place] direct Issue: Member interested in pain management options.  Subjective:  Patient states she is still  frustrated with the length of time it has taken to determine if the [facility] would take her as a patient.   Patient states plan to follow-up with Psychiatric Practice to see if pain management needs can be managed through Psych rather than a Pain Management Clinic due to Clinic refused patient.  States her Primary MD, Dr. Stephanie Levine will send new referral package to [facility] if they are in network with Victoria Levine.   Providers: Primary MD: Dr. Jonathon Levine - last appt:01/26/2015 / next appt 05/10/2016. [facility]: Victoria Cote, NP - last appt: 11/19/14 Behavior Health services active with [facility] Counseling: Victoria Levine - last appt: 07/20/2015 Optometrist: Dr. Sabra Levine 2014.  Dentist:2014 Insurance: [place]: Telephonic Nurse call; contact: Victoria Levine  Social: Patient prefers to be called "Victoria Levine" Mobility: Divorced and lives alone for about 10 years.  Falls: No new falls reported since 05/09/15 screening call. H/o 1 over the past year and sustained a concussion; tripped over a bathroom rug during the night. (patient unable to recall the date of fall). C/o decreased muscle strength due to deconditioning associated to lack of activity due to pain. Patient is still able to run errands to manage details of her life but not without pain. States very poor quality of lift at this time due to pain.   Bipolar, Anxiety:  H/o 27 years of being active with AA services and had a sponsor/Victoria Levine but sponsor stopped providing support on previous Psych Admission when sponsor discovered that patient had been abusing  Oxycodone. H/o  no longer attends AA but does attend NA now.   Patient verbalized clear understanding that MD has not ordered Xanax or Librium.  Patient reported on 05/27/15 she had a "stock pile" of Librium from her past Alcohol Detox Program which she saved and has Xanax left over for old prescription order and only takes when she needs to.H/o Patient gave example, "If you told me you could not help me and was no longer going to keep me in this program; I would have to hang up and take a pill now. You do not understand how bad my life has been in the past and what I have been through." Transportation: yes - self Caregiver: None. H/o a daughter with 3 children 7, 5 and 1 married to Victoria Levine and a brother in [place] but does not call on them for any assistance.   Support System: friends, neighbors. Advanced Directives: None but states interest in completing. States most likely would choose her brother who lives in [place] but has not discussed with him yet. Patient reported on 06/27/15 she would contact her brother to confirm plan. Patient provided no further updates this call.   SS: $1,953.00 a month + pension $900.00. College loan forgiven due to poverty level per their assessment.  DME: Victoria Levine, back brace, Tens Unit, Prescription and reading glasses.   THN conditions: None Other: Chronic Pain -H/o past Opioid abuse with opioid-induced mood disorder (2016) -H/o past Benzodiazepine dependence (2016) -Osteoporosis with back compression fractures on Prolia since 2013. -Spondylolisthesis of lumbar region -H/o Back Surgery 02/05/2010 -H/o a fall when she fell with a leaf blower and rolled 3 times in 2011.  -H/o  past chronic pain management with Oxycodone but no longer prescribed. (States she has not been on any Oxycodone since out of treatment August 2016).  H/o [place] [facility]: Dr. Maryjean Levine referred to [facility] for pain management but  takes no insurance and charges are $14 per day. Patient states this is not affordable. Pain Management, Victoria Levine in the [facility]: Last appt 05/05/15. H/o patient reported Dr. Maryjean Levine will no longer prescribe medication/Oxycodone due to past issue with patient using incorrectly. H/o Victoria Levine in the [facility]: Last appt 05/05/15 advised nothing more they can do and no further follow-up appt's scheduled.H/o Primary Care MD: Dr. Stephanie Levine advised patient she will not treat pain. Dr. Stephanie Levine sent Referral on 05/10/2015 to [place] [phone]).  [employer], Victoria Levine has provided her with a list of providers but she has not decided where she will try to go yet.  Patient states her [facility] counselor, Victoria Levine has advised patient to seek pain management outside the [facility] due to having the diagnosis of oxycodone and xanax dependent.    ASTHMA C/O no symptoms or ASTHMA related issues. H/o Managed by Primary MD due to Specialty co-pay $60.00 and unaffordable.   Medications:  Osteoporosis management: Prolia Injections: 2 injections a year. Last injection was 05/23/15 and due again approximately 11/2015.  Provider: [facility]. Patient is able to cover cost.  Medication reconciliation: Unable to complete this call, patient too upset about pain management denial.   Objective:   Encounter Medications:  Outpatient Encounter Prescriptions as of 08/03/2015  Medication Sig Note  . calcium citrate-vitamin D (CALCIUM + D) 315-200 MG-UNIT per tablet Take 1 tablet by mouth daily. For bone health 05/27/2015: Patient not taking 05/27/2015  . denosumab (PROLIA) 60 MG/ML SOLN injection Inject 60 mg into the skin every 6 (six) months. Administer in upper arm, thigh, or abdomen   . FLUoxetine (PROZAC) 40 MG capsule Take 1 capsule (40 mg total) by mouth daily. For depression 08/03/2015: Patient taking 20 mg every day.   . gabapentin  (NEURONTIN) 300 MG capsule Take 1 capsule (300 mg total) by mouth 2 (two) times daily. For agitation/pain management 06/27/2015: Patient reported on 06/27/2015 she is taking 600 mg 3 times a day if needed but states only taking twice a day.   . levothyroxine (SYNTHROID) 125 MCG tablet Take 1 tablet (125 mcg total) by mouth daily before breakfast. For low functioning thyroid   . methocarbamol (ROBAXIN) 500 MG tablet Take 500 mg by mouth.   . Multiple Vitamin (MULTIVITAMIN) tablet Take 1 tablet by mouth daily. For low vitamin   . naproxen sodium (ANAPROX) 220 MG tablet Take 220 mg by mouth 2 (two) times daily with a meal. Reported on 06/27/2015   . ondansetron (ZOFRAN) 8 MG tablet Take 8 mg by mouth every 8 (eight) hours as needed for nausea or vomiting (Migraine Head Ache symptoms). 08/03/2015: Taking as needed for migraine headaches.   Marland Kitchen oxcarbazepine (TRILEPTAL) 600 MG tablet Take 600 mg by mouth daily.   . QUEtiapine (SEROQUEL) 50 MG tablet Take 1 tablet by mouth daily. 08/03/2015: Patient takint 100 mg tablets 1-2 tabs at night   . traMADol (ULTRAM) 50 MG tablet Take 50 mg by mouth every 6 (six) hours as needed for moderate pain. Reported on 06/27/2015 06/27/2015: Patient not taking   . valACYclovir (VALTREX) 500 MG tablet Take 500 mg by mouth 2 (two) times daily.   Marland Kitchen ALPRAZolam (XANAX) 0.5 MG  tablet Reported on 06/27/2015 05/27/2015: Patient states no active Rx order but has left over medication from the past which she uses when needed  05/27/2015.    . beclomethasone (QVAR) 80 MCG/ACT inhaler Inhale 1 puff into the lungs as needed (sob). For shortness of breath (Patient not taking: Reported on 08/03/2015) 08/03/2015: Patient states not covered by her insurance; not using.   . celecoxib (CELEBREX) 200 MG capsule Take 1 capsule (200 mg total) by mouth 2 (two) times daily. For arthritic pain (Patient not taking: Reported on 05/09/2015) 05/27/2015: Patient not taking. 05/27/2015  . chlordiazePOXIDE (LIBRIUM) 5 MG  capsule Take 3 capsules (15 mg total) by mouth 3 (three) times daily. For alcohol detox treatment (Patient not taking: Reported on 06/27/2015) 05/27/2015: Patient states she has a "stock pile" from alcohol detox program but has no active order for this Rx.  States uses as needed to replace Xanax. 05/27/2015  . Oxcarbazepine (TRILEPTAL) 300 MG tablet Take 1 tablet (300 mg total) by mouth 2 (two) times daily. Mood stabilization (Patient not taking: Reported on 05/27/2015) 05/27/2015: Patient not taking: new dosage order.   . pantoprazole (PROTONIX) 40 MG tablet Take 1 tablet (40 mg total) by mouth daily. For acid reflux (Patient not taking: Reported on 05/09/2015)   . ranitidine (ZANTAC) 300 MG tablet Take 300 mg by mouth at bedtime. 11/19/2014: Received from: External Pharmacy  . rizatriptan (MAXALT-MLT) 10 MG disintegrating tablet as needed. 11/19/2014: Received from: External Pharmacy Received Sig: TAKE 1 TABLET BY MOUTH AS NEEDED MIGRAINE  . tiotropium (SPIRIVA) 18 MCG inhalation capsule Place 1 capsule (18 mcg total) into inhaler and inhale daily. For COPD (Patient not taking: Reported on 08/03/2015) 05/27/2015: States out of this medication and has not refilled due to not needed 05/27/2015   No facility-administered encounter medications on file as of 08/03/2015.    Functional Status:  In your present state of health, do you have any difficulty performing the following activities: 06/28/2015 05/27/2015  Hearing? N N  Vision? N N  Difficulty concentrating or making decisions? N N  Walking or climbing stairs? Y Y  Dressing or bathing? N N  Doing errands, shopping? N N  Preparing Food and eating ? N N  Using the Toilet? N N  In the past six months, have you accidently leaked urine? N N  Do you have problems with loss of bowel control? N N  Managing your Medications? N N  Managing your Finances? N N  Housekeeping or managing your Housekeeping? N N    Fall/Depression Screening: PHQ 2/9 Scores 05/27/2015  05/09/2015  PHQ - 2 Score 0 0   Fall Risk  06/28/2015 05/27/2015 05/09/2015  Falls in the past year? Yes Yes Yes  Number falls in past yr: 1 1 1   Injury with Fall? Yes Yes Yes  Risk Factor Category  High Fall Risk High Fall Risk High Fall Risk  Risk for fall due to : History of fall(s);Impaired balance/gait;Impaired mobility;Medication side effect History of fall(s);Impaired mobility;Medication side effect History of fall(s);Impaired balance/gait;Medication side effect  Follow up Falls evaluation completed;Education provided;Falls prevention discussed Falls evaluation completed;Education provided;Falls prevention discussed Falls evaluation completed;Education provided;Falls prevention discussed     Assessment: Patient remains adherent to engaging with [facility]. Patient seeking pain management through correct channels. H/o Patient lacks clear understanding of reason she needs to remain compliant with meds and not self manage without discussing with her MDs first in order to achieve best clinical response.   Plan:  Referral Date: 05/04/2015 Screening: 05/09/2015 Telephonic RN CM start date: 05/09/2015 Program: Other: Pain management: 05/09/2015 Initial Assessment: 05/27/2015  Pain Management: RN CM will follow-up on progress of Pain Management Clinic medical record review.  RN CM reviewed past possibilities of pain clinics:  -[facility] for pain management but takes no insurance and charges are $14 per day. Patient states this is not affordable. -[name] on 05/10/15 [phone]) [phone]- provider is not in-network with [employer] Plus plan and patient did not feel this was affordable.  [facility] [phone] - [employer] in-network: yes. Referral sent on 05/10/2015.  Referral reviewed and denied 5/52017. : "Nothing to offer this patient". -RN CM discussed option of other providers and option to go out of network as a patient  right. Patient stated she is not able to afford to go out of network and this is not an option.  -RN CM encouraged patient to discuss with Primary MD to assist with any other referral needs to other pain clinics or Psychiatric Clinic.    Advanced Directives: RN CM will discuss again on next contact call after patient follows-up with brother for confirmation on agreement to act as HCPOA.   RN CM scheduled next contact call within the next 30 days for Telephonic Monthly Assessment and Care Coordination services as needed. . RN CM advised to please notify MD of any changes in condition prior to scheduled appt's.  RN CM provided contact name and # [phone] or main office # [phone] and 24-hour nurse line # [phone].  RN CM confirmed patient is aware of 911 services for urgent emergency needs.  [name], RN, BSN, [facility], CCM  [employer] Management Coordinator [phone] Direct [phone] Cell [phone] Office [phone] Fax

## 2015-08-23 ENCOUNTER — Other Ambulatory Visit: Payer: Self-pay

## 2015-08-23 NOTE — Patient Outreach (Addendum)
[facility]) Care Management  08/23/2015  Victoria Levine [birth year] TL:[id number]  Telephone Monthly Assessment  Referral Date: 05/04/2015 Source: [facility] direct Issue: Member interested in pain management options.  Subjective:  Patient states she is still frustrated with the length of time it has taken to determine if the [facility] would take her as a patient. Patient states plan to follow-up with Psychiatric Practice to see if pain management needs can be managed through Psych rather than a Pain Management Clinic due to Clinic refused patient. States her Primary MD, Dr. [name] will send new referral package to [facility] if they are in network with [facility].   Providers: Primary MD: Dr. [name] - last appt:01/26/2015 / next appt 05/10/2016. [facility]: [name], NP - last appt: 11/19/14 Behavior Health services active with [facility] Counseling: [name] - last appt: 07/20/2015 - next appt 09/29/15 Optometrist: Dr. [name] 2014.  Dentist:2014 Insurance: [place]: Telephonic Nurse call; Contact: [name]  Social: Patient prefers to be called "[name]" Mobility: Divorced and lives alone for about 10 years.  Falls: No new falls reported since 05/09/15 screening call. H/o 1 over the past year and sustained a concussion; tripped over a bathroom rug during the night. (patient unable to recall the date of fall). H/o  decreased muscle strength due to deconditioning associated to lack of activity due to pain. Patient is still able to run errands to manage details of her life but not without pain. States very poor quality of lift at this time due to pain.   Bipolar, Anxiety:  H/o 27 years of being active with AA services and had a sponsor/[name] but sponsor stopped providing support on previous Psych Admission when sponsor discovered that  patient had been abusing Oxycodone. H/o no longer attends AA but does attend NA now.   Patient verbalized clear understanding that MD has not ordered Xanax or Librium.  Patient reported on 05/27/15 she had a "stock pile" of Librium from her past Alcohol Detox Program which she saved and has Xanax left over for old prescription order and only takes when she needs to.H/o Patient gave example, "If you told me you could not help me and was no longer going to keep me in this program; I would have to hang up and take a pill now. You do not understand how bad my life has been in the past and what I have been through." Transportation: yes - self Caregiver: None. H/o a daughter with 3 children 7, 5 and 1 married to [name] and a brother in [place] but does not call on them for any assistance.   Support System: friends, neighbors. Advanced Directives: None but interest in completing. States most likely would choose her brother who lives in [place] but has not discussed with him yet. Patient reported on 06/27/15 she would contact her brother to confirm plan. Patient has placed on hold and will complete when she feels appropriate to discuss with her brother due to brother recently had a stroke.   SS: $1,953.00 a month + pension $900.00. College loan forgiven due to poverty level per their assessment.  DME: [name], back brace,Tens Unit,  Nebulizer, Prescription and reading glasses.   THN conditions: None Other: Chronic Pain -H/o past Opioid abuse with opioid-induced mood disorder (2016) -H/o past Benzodiazepine dependence (2016) -Osteoporosis with back compression fractures on Prolia since 2013. -Spondylolisthesis of lumbar region -H/o Back Surgery 02/05/2010 -H/o  a fall when she fell with a leaf blower and rolled 3 times in 2011.  -H/o past chronic pain management with Oxycodone but no longer prescribed. (States she has not been on any Oxycodone since out of treatment  August 2016).  H/o [facility] back surgeon: Dr. [name] referred to [facility] for pain management but takes no insurance and charges are $14 per day. Patient states this is not affordable. Pain Management, Dr. [name] in the [facility]: Last appt 05/05/15. H/o patient reported Dr. [name] will no longer prescribe medication/Oxycodone due to past issue with patient using incorrectly. H/o Dr. [name] in the [facility]: Last appt 05/05/15 advised nothing more they can do and no further follow-up appt's scheduled.H/o Primary Care MD: Dr. [name] advised patient she will not treat pain. Dr. [name] sent Referral on 05/10/2015 to [facility] [phone]; (refused to take patient after review of MR completed).  [employer], Contact: [name] has provided patient with a list of providers.  H/o patient previously reported Biomedical scientist, [name] has advised patient to seek pain management outside the [facility] due to having the diagnosis of oxycodone and xanax dependent.  H/o interventions explored with patient this Episode of care:  -[facility] for pain management but takes no insurance and charges are $14 per day. Patient states this is not affordable. -[name] on 05/10/15 [phone]) [phone]- provider is not in-network with [employer] Plus plan and patient did not feel this was affordable.  [facility] [phone] - [employer] in-network: yes. Referral sent on 05/10/2015. Referral reviewed and denied 5/52017. : "Nothing to offer this patient". -RN CM discussed option of other providers and option to go out of network as a patient right. Patient stated she is not able to afford to go out of network and this is not an option.  -RN CM encouraged patient to discuss with Primary MD to assist with any other referral needs to other pain clinics or Psychiatric Clinic.  Update:  Patient states she  has elected to hold off on pursuing Pain Clinic at this time and has appointment scheduled to see Dr. Joya Salm with [facility] (who patient has seen in the past) on June 8th 2017.  States goal of confirming there is not new issues causing pain.  States if x-ray and other testing completed and nothing found; patient will continue to try to seek Pain Management Clinic.  States plan to request refill on Tramadol on 08/25/15 surgeon appointment.  States she attempted to refill but Dr. Lovenia Shuck declined refill without seeing patient on office visit.    ASTHMA C/O slight symptoms or ASTHMA related issues this morning but relieved with Nebulizer treatment.  H/o Managed by Primary MD due to Specialty co-pay $60.00 and unaffordable.   Osteoporosis  Denies any falls.  States exercise consist of morning stretching and treadmill 15 minutes 3-4 times a week.  States shopping/grocery trips are very taxing and increase pain.  Patient due for Bone density.  Prolia Injections: 2 injections a year. Last injection was 05/23/15 and due again approximately 11/2015.  Provider: [facility] [place]. Patient is able to cover cost.   Medications:  Medication reconciliation completed with patient this call.  Patient reports she has returned to taking Tramadol.    Encounter Medications:  Outpatient Encounter Prescriptions as of 08/23/2015  Medication Sig Note  . ALPRAZolam (XANAX) 0.5 MG tablet Reported on 06/27/2015 08/23/2015: Patient states no active Rx order  but has left over medication from the past which she uses when needed  05/27/2015.   Patient taking 1/2 tablet at bedtime.     . calcium citrate-vitamin D (CALCIUM + D) 315-200 MG-UNIT per tablet Take 1 tablet by mouth daily. For bone health 05/27/2015: Patient not taking 05/27/2015  . denosumab (PROLIA) 60 MG/ML SOLN injection Inject 60 mg into the skin every 6 (six) months. Administer in upper arm, thigh, or abdomen   . FLUoxetine (PROZAC) 40 MG  capsule Take 1 capsule (40 mg total) by mouth daily. For depression 08/23/2015: Patient taking 20 mg qd     . gabapentin (NEURONTIN) 300 MG capsule Take 1 capsule (300 mg total) by mouth 2 (two) times daily. For agitation/pain management 06/27/2015: Patient reported on 06/27/2015 she is taking 600 mg 3 times a day if needed but states only taking twice a day.   . levothyroxine (SYNTHROID) 125 MCG tablet Take 1 tablet (125 mcg total) by mouth daily before breakfast. For low functioning thyroid   . methocarbamol (ROBAXIN) 500 MG tablet Take 500 mg by mouth.   . Multiple Vitamin (MULTIVITAMIN) tablet Take 1 tablet by mouth daily. For low vitamin   . naproxen sodium (ANAPROX) 220 MG tablet Take 220 mg by mouth 2 (two) times daily with a meal. Reported on 06/27/2015   . ondansetron (ZOFRAN) 8 MG tablet Take 8 mg by mouth every 8 (eight) hours as needed for nausea or vomiting (Migraine Head Ache symptoms). 08/03/2015: Taking as needed for migraine headaches.   Marland Kitchen oxcarbazepine (TRILEPTAL) 600 MG tablet Take 600 mg by mouth daily.   . QUEtiapine (SEROQUEL) 50 MG tablet Take 1 tablet by mouth daily. 08/03/2015: Patient takint 100 mg tablets 1-2 tabs at night   . rizatriptan (MAXALT-MLT) 10 MG disintegrating tablet as needed. 11/19/2014: Received from: External Pharmacy Received Sig: TAKE 1 TABLET BY MOUTH AS NEEDED MIGRAINE  . traMADol (ULTRAM) 50 MG tablet Take 50 mg by mouth every 6 (six) hours as needed for moderate pain. Reported on 06/27/2015 08/23/2015: Plans to request new refill on pending appt 08/25/15.     Marland Kitchen valACYclovir (VALTREX) 500 MG tablet Take 500 mg by mouth 2 (two) times daily.   . beclomethasone (QVAR) 80 MCG/ACT inhaler Inhale 1 puff into the lungs as needed (sob). For shortness of breath (Patient not taking: Reported on 08/03/2015) 08/23/2015: Patient states not covered by her insurance; replaced with Flovent.     . celecoxib (CELEBREX) 200 MG capsule Take 1 capsule (200 mg total) by mouth 2 (two) times  daily. For arthritic pain (Patient not taking: Reported on 05/09/2015) 08/23/2015: Patient not taking.  (states did not help and had an expensive co-pay)    . chlordiazePOXIDE (LIBRIUM) 5 MG capsule Take 3 capsules (15 mg total) by mouth 3 (three) times daily. For alcohol detox treatment (Patient not taking: Reported on 06/27/2015) 08/23/2015: Patient states she has a "stock pile" from alcohol detox program but has no active order for this Rx. Patient uses as needed to replace Xanax.    . Oxcarbazepine (TRILEPTAL) 300 MG tablet Take 1 tablet (300 mg total) by mouth 2 (two) times daily. Mood stabilization (Patient not taking: Reported on 08/23/2015) 05/27/2015: Patient not taking: new dosage order.   . pantoprazole (PROTONIX) 40 MG tablet Take 1 tablet (40 mg total) by mouth daily. For acid reflux (Patient not taking: Reported on 05/09/2015)   . ranitidine (ZANTAC) 300 MG tablet Take 300 mg by mouth at bedtime. 11/19/2014: Received from:  External Pharmacy  . tiotropium (SPIRIVA) 18 MCG inhalation capsule Place 1 capsule (18 mcg total) into inhaler and inhale daily. For COPD (Patient not taking: Reported on 08/03/2015) 05/27/2015: States out of this medication and has not refilled due to not needed 05/27/2015   No facility-administered encounter medications on file as of 08/23/2015.    Functional Status:  In your present state of health, do you have any difficulty performing the following activities: 08/23/2015 06/28/2015  Hearing? N N  Vision? N N  Difficulty concentrating or making decisions? N N  Walking or climbing stairs? Y Y  Dressing or bathing? N N  Doing errands, shopping? Y N  Preparing Food and eating ? N N  Using the Toilet? N N  In the past six months, have you accidently leaked urine? N N  Do you have problems with loss of bowel control? N N  Managing your Medications? N N  Managing your Finances? N N  Housekeeping or managing your Housekeeping? Aggie Moats    Fall/Depression Screening: PHQ 2/9 Scores  08/23/2015 05/27/2015 05/09/2015  PHQ - 2 Score 0 0 0   Fall Risk  08/23/2015 06/28/2015 05/27/2015 05/09/2015  Falls in the past year? Yes Yes Yes Yes  Number falls in past yr: 1 1 1 1   Injury with Fall? Yes Yes Yes Yes  Risk Factor Category  - High Fall Risk High Fall Risk High Fall Risk  Risk for fall due to : History of fall(s);Impaired mobility;Medication side effect;Other (Comment);Impaired balance/gait History of fall(s);Impaired balance/gait;Impaired mobility;Medication side effect History of fall(s);Impaired mobility;Medication side effect History of fall(s);Impaired balance/gait;Medication side effect  Risk for fall due to (comments): osteoporosis and pain  - - -  Follow up Falls evaluation completed;Education provided;Falls prevention discussed Falls evaluation completed;Education provided;Falls prevention discussed Falls evaluation completed;Education provided;Falls prevention discussed Falls evaluation completed;Education provided;Falls prevention discussed    Assessment: Patient remains adherent to engaging with [facility]. Patient seeking pain management through correct channels. H/o patient continues to take medications as she feels she needs them.  Example:  Patient has returned to taking Tramadol without discussing with MD.  Patient is taking Ativan 1/2 tab at bedtime (medication currently not ordered by MD; patient has extra medication left from the past).    Plan:  Referral Date: 05/04/2015 Screening: 05/09/2015 Telephonic RN CM start date: 05/09/2015 Program: Other: Pain management: 05/09/2015 Initial Assessment: 05/27/2015 MD Quarterly update:  08/23/2015  Pain Management: RN CM will follow-up outcome of Neurologist/Surgeon recommendations on next contact call.  RN CM will continue to assess patient's use of medications and taking medications without discussing with MD.    Medications: RN CM advised patient to take medications as ordered only.   Advanced  Directives: RN CM will continue to encourage completion of document.   RN CM scheduled next contact call within the next 30 days for Telephonic Monthly Assessment and Care Coordination services as needed. . RN CM advised to please notify MD of any changes in condition prior to scheduled appt's.  RN CM provided contact name and # [phone] or main office # [phone] and 24-hour nurse line # [phone].  RN CM confirmed patient is aware of 911 services for urgent emergency needs.  [name], RN, BSN, [facility], CCM  [employer] Management Coordinator [phone] Direct [phone] Cell [phone] Office [phone] Fax

## 2015-08-25 DIAGNOSIS — Z6828 Body mass index (BMI) 28.0-28.9, adult: Secondary | ICD-10-CM | POA: Diagnosis not present

## 2015-08-25 DIAGNOSIS — M545 Low back pain: Secondary | ICD-10-CM | POA: Diagnosis not present

## 2015-08-31 ENCOUNTER — Ambulatory Visit: Payer: Self-pay

## 2015-09-21 ENCOUNTER — Other Ambulatory Visit: Payer: Self-pay

## 2015-09-21 NOTE — Patient Outreach (Signed)
[facility]) Care Management  09/21/2015  UBAH RADKE 05/13/1950 [id number]   Telephonic Monthly Assessment Program:  Other: Pain Management 05/09/15 Insurance: [name] Plus Medicare Advantage  Subjective:  Patient states she followed-up with Surgeon:  Dr.Ernesto M. Joya Salm, [facility] June 8th 2017 and had x-ray completed indicating no problems with patient's hardware.  Patient goal of confirming there are no new issues causing pain met.  Dr. Joya Salm recommended Pain Management intervention with injections for pain management.  States Dr. Joya Salm stated plan to make referral to Dr. Lynden Oxford, [place], [phone].  Patient states she has not heard anything back from MD yet. Patient states Primary MD, Dr. Solon Palm is working on a 4th referral to [facility], [facility] which was initiated about 1-2 weeks ago.  States her understanding is that Dr. [name] office will call patient with updates.  Patient states she does not know the MD that Dr. [name] is sending the Referral to.    Providers: Primary MD: Dr. [name] - last appt:(Patient unable to recall the date of last appt).  [facility]: [name], NP - last appt: 11/19/14 Behavior Health services active with [facility] Counseling: [name] - last appt: 07/20/2015 - next appt 09/29/15 Optometrist: Mariann Barter Miller:  Last appt  (2015).  Dentist: Dr. Nicki Reaper Minor (2015) [employer]: Telephonic Nurse call; Contact: [name]  Social: Patient prefers to be called "[name]."  Patient reached at [phone] Mobility: Divorced and lives alone for about 10 years.  Falls: No new falls reported since 05/09/15 screening call. H/o 1 over the past year and sustained a concussion; tripped over a bathroom rug during the night. (patient unable to recall the date of fall). H/o decreased muscle strength due to deconditioning  associated to lack of activity due to pain. Patient is still able to run errands to manage details of her life but not without pain and planning / spacing her trips out over the week.  Bipolar, Anxiety:  H/o 27 years of being active with AA services and had a sponsor/[name] but sponsor stopped providing support on previous Psych Admission when sponsor discovered that patient had been abusing Oxycodone. H/o no longer attends AA but does attend NA now.   Patient verbalized clear understanding that MD has not ordered Xanax or Librium.  Patient reported on 05/27/15 she had a "stock pile" of Librium from her past Alcohol Detox Program which she saved and has Xanax left over for old prescription order and only takes when she needs to.H/o Patient gave example, "If you told me you could not help me and was no longer going to keep me in this program; I would have to hang up and take a pill now. You do not understand how bad my life has been in the past and what I have been through."  Patient states that both medications are now gone and she no longer has any of these two medications.  Transportation: yes - self Caregiver: None. H/o a daughter with 3 children 7, 5 and 1 married to [name] and a brother in [place] but does not call on them for any assistance.   Support System: friends, neighbors. Advanced Directives: None but h/o interest in completing. H/o most likely would choose her brother who lives in [place] but has not discussed with him yet. Plan placed on hold and will complete when she feels appropriate to discuss with her brother due to brother recently had  a stroke.  SS: $1,953.00 a month + pension $900.00.  Resources; [employer] forgiven due to poverty level per their assessment.  DME: [name], back brace,Tens Unit, Nebulizer, Glasses:  prescription and reading glasses.   Co-morbidities:  Hypothyroidism, GERD, Hypercholesterolemia, Hiatal Hernia, Depression,  Bipolar, Anxiety, Chronic Obstructive Asthma, Chronic pain. Osteoporosis, Spondylolisthesis of lumbar region, Herpes.  Chronic Pain -H/o past Opioid abuse with opioid-induced mood disorder (2016) -H/o past Benzodiazepine dependence (2016) -Osteoporosis with back compression fractures on Prolia since 2013. -Spondylolisthesis of lumbar region -H/o Back Surgery 02/05/2010 -H/o a fall when she fell with a leaf blower and rolled 3 times in 2011.  -H/o past chronic pain management with Oxycodone but no longer prescribed. (States she has NOT been on any Oxycodone since out of treatment August 2016).  H/o [facility]: Dr. [name] referred to [facility] for pain management but takes no insurance and charges are $14 per day. Patient states this is not affordable. Pain Management, Dr. [name] in the [facility]: Last appt 05/05/15. H/o patient reported Dr. [name] will no longer prescribe medication/Oxycodone due to past issue with patient using incorrectly. H/o Dr. [name] in the [facility]: Last appt 05/05/15 advised nothing more they can do and no further follow-up appt's scheduled.H/o Primary Care MD: Dr. [name] advised patient she will not treat pain. Dr. [name] sent Referral on 05/10/2015 to [facility] [phone]; (refused to take patient after review of MR completed).  [employer], Contact: [name] has provided patient with a list of providers. H/o patient previously reported Biomedical scientist, [name] has advised patient to seek pain management outside the [place] due to having the diagnosis of oxycodone and xanax dependent.  H/o interventions explored with patient this Episode of care:  -[employer] for pain management but takes no insurance and charges are $14 per day. Patient states this is not affordable. -[name] on 05/10/15 [phone]) [phone]- provider is not in-network with [employer] Gold Plus plan and  patient did not feel this was affordable.  [facility] [phone] - [employer] Gold in-network: yes. Referral sent on 05/10/2015. Referral reviewed and denied 5/52017. : "Nothing to offer this patient". -RN CM discussed option of other providers and option to go out of network as a patient right. Patient stated she is not able to afford to go out of network and this is not an option.  -RN CM encouraged patient to discuss with Primary MD to assist with any other referral needs to other pain clinics or Psychiatric Clinic.    ASTHMA H/o Managed by Primary MD due to Specialty co-pay $60.00 and unaffordable.  States symptoms well managed with current medication regime.   Osteoporosis  Denies any falls since 04/2015.  Exercise consist of morning stretching and treadmill 15 minutes 3-4 times a week.  Shopping/grocery trips are very taxing and increase pain; schedules out over the week .  Patient due for Bone density; has not scheduled appt yet but provides teach back that she understands she has received letter from [facility] which indicates patient only needs to contact GYN to schedule appt. .  Prolia Injections: 2 injections a year. Last injection was 05/23/15 and due again approximately 11/2015.  Provider: [facility] [place]. Patient is able to cover cost.    Medications:  Medication reconciliation completed with patient 09/21/2015  Tramadol:  Patient previously reported plan to request refill on Tramadol on 08/25/15 surgeon appointment. Provides update today she attempted  to refill but Dr. Lovenia Shuck but declined refill without seeing patient on office visit.   Encounter Medications:  Outpatient Encounter Prescriptions as of 09/21/2015  Medication Sig Note  . beclomethasone (QVAR) 80 MCG/ACT inhaler Inhale 1 puff into the lungs as needed (sob). For shortness of breath 09/21/2015: Patient states medication changed over to Flovent due to NOT  covered by insurance.  States she has about 20 puffs left on the QVAR before changing over to [employer]. Patient has not started Flovent yet.     . calcium citrate-vitamin D (CALCIUM + D) 315-200 MG-UNIT per tablet Take 1 tablet by mouth daily. For bone health 09/21/2015: Taking again.   Marland Kitchen denosumab (PROLIA) 60 MG/ML SOLN injection Inject 60 mg into the skin every 6 (six) months. Administer in upper arm, thigh, or abdomen   . FLUoxetine (PROZAC) 40 MG capsule Take 1 capsule (40 mg total) by mouth daily. For depression 09/21/2015: Patient taking 20 mg every day.     . gabapentin (NEURONTIN) 300 MG capsule Take 1 capsule (300 mg total) by mouth 2 (two) times daily. For agitation/pain management 06/27/2015: Patient reported on 06/27/2015 she is taking 600 mg 3 times a day if needed but states only taking twice a day.   . levothyroxine (SYNTHROID) 125 MCG tablet Take 1 tablet (125 mcg total) by mouth daily before breakfast. For low functioning thyroid   . methocarbamol (ROBAXIN) 500 MG tablet Take 500 mg by mouth. 09/21/2015: States taking three times a day.   . Multiple Vitamin (MULTIVITAMIN) tablet Take 1 tablet by mouth daily. For low vitamin 09/21/2015: Patient taking gummies  . naproxen sodium (ANAPROX) 220 MG tablet Take 220 mg by mouth 2 (two) times daily with a meal. Reported on 06/27/2015 09/21/2015: Taking every night.   . ondansetron (ZOFRAN) 8 MG tablet Take 8 mg by mouth every 8 (eight) hours as needed for nausea or vomiting (Migraine Head Ache symptoms). 08/03/2015: Taking as needed for migraine headaches.   Marland Kitchen oxcarbazepine (TRILEPTAL) 600 MG tablet Take 600 mg by mouth daily. 09/21/2015: Patient states she is taking twice a day.   Marland Kitchen QUEtiapine (SEROQUEL) 50 MG tablet Take 1 tablet by mouth daily. 08/03/2015: Patient takint 100 mg tablets 1-2 tabs at night   . ranitidine (ZANTAC) 300 MG tablet Take 300 mg by mouth at bedtime. 11/19/2014: Received from: External Pharmacy  . rizatriptan (MAXALT-MLT) 10 MG  disintegrating tablet as needed. 11/19/2014: Received from: External Pharmacy Received Sig: TAKE 1 TABLET BY MOUTH AS NEEDED MIGRAINE  . valACYclovir (VALTREX) 500 MG tablet Take 500 mg by mouth 2 (two) times daily. 09/21/2015: Takes as needed only.   Marland Kitchen ALPRAZolam (XANAX) 0.5 MG tablet Reported on 09/21/2015 09/21/2015: Patient states NOT TAKING;  no longer has any Alprazolam left; "it is all gone."      . celecoxib (CELEBREX) 200 MG capsule Take 1 capsule (200 mg total) by mouth 2 (two) times daily. For arthritic pain (Patient not taking: Reported on 05/09/2015) 08/23/2015: Patient not taking.  (states did not help and had an expensive co-pay)    . chlordiazePOXIDE (LIBRIUM) 5 MG capsule Take 3 capsules (15 mg total) by mouth 3 (three) times daily. For alcohol detox treatment (Patient not taking: Reported on 06/27/2015) 09/21/2015: States med is gone and not taking any longer.     . Oxcarbazepine (TRILEPTAL) 300 MG tablet Take 1 tablet (300 mg total) by mouth 2 (two) times daily. Mood stabilization (Patient not taking: Reported on 08/23/2015) 05/27/2015: Patient not taking: new  dosage order.   . pantoprazole (PROTONIX) 40 MG tablet Take 1 tablet (40 mg total) by mouth daily. For acid reflux (Patient not taking: Reported on 05/09/2015) 09/21/2015: Patient not taking; replaced with Ranitidine.   . tiotropium (SPIRIVA) 18 MCG inhalation capsule Place 1 capsule (18 mcg total) into inhaler and inhale daily. For COPD (Patient not taking: Reported on 08/03/2015) 05/27/2015: States out of this medication and has not refilled due to not needed 05/27/2015  . traMADol (ULTRAM) 50 MG tablet Take 50 mg by mouth every 6 (six) hours as needed for moderate pain. Reported on 09/21/2015 09/21/2015: Patient not taking.  Patient attempted to refill but Dr. Lovenia Shuck declined refill without seeing patient on office visit.      No facility-administered encounter medications on file as of 09/21/2015.    Functional Status:  In your present state of  health, do you have any difficulty performing the following activities: 08/23/2015 06/28/2015  Hearing? N N  Vision? N N  Difficulty concentrating or making decisions? N N  Walking or climbing stairs? Y Y  Dressing or bathing? N N  Doing errands, shopping? Y N  Preparing Food and eating ? N N  Using the Toilet? N N  In the past six months, have you accidently leaked urine? N N  Do you have problems with loss of bowel control? N N  Managing your Medications? N N  Managing your Finances? N N  Housekeeping or managing your Housekeeping? Aggie Moats    Fall/Depression Screening: PHQ 2/9 Scores 09/21/2015 08/23/2015 05/27/2015 05/09/2015  PHQ - 2 Score 0 0 0 0    Fall Risk  09/21/2015 08/23/2015 06/28/2015 05/27/2015 05/09/2015  Falls in the past year? Yes Yes Yes Yes Yes  Number falls in past yr: '1 1 1 1 1  ' Injury with Fall? Yes Yes Yes Yes Yes  Risk Factor Category  High Fall Risk - High Fall Risk High Fall Risk High Fall Risk  Risk for fall due to : History of fall(s);Impaired balance/gait;Impaired mobility;Other (Comment) History of fall(s);Impaired mobility;Medication side effect;Other (Comment);Impaired balance/gait History of fall(s);Impaired balance/gait;Impaired mobility;Medication side effect History of fall(s);Impaired mobility;Medication side effect History of fall(s);Impaired balance/gait;Medication side effect  Risk for fall due to (comments): pain osteoporosis and pain  - - -  Follow up Falls evaluation completed;Education provided;Falls prevention discussed Falls evaluation completed;Education provided;Falls prevention discussed Falls evaluation completed;Education provided;Falls prevention discussed Falls evaluation completed;Education provided;Falls prevention discussed Falls evaluation completed;Education provided;Falls prevention discussed    Assessment: Patient remains adherent to engaging with [place].  Patient seeking pain management through correct channels.  Plan:   Referral Date: 05/04/2015 Screening: 05/09/2015 Telephonic RN CM start date: 05/09/2015 Program: Other: Pain management: 05/09/2015 Initial Assessment: 05/27/2015 MD Quarterly update: 08/23/2015  Pain Management: RN CM will follow-up Pain Management Referral process and provide assistance as needed.  RN CM will continue to assess patient's use of medications, adherence, and taking as MD prescribes.  Osteoporosis: RN CM provided education on importance of adherence to scheduling and completing Bone Density testing and importance of having results to assist with authorization needs for continuation of Prolia Injections.    Fall prevention discussed.  Calcium fortified diet encouraged.   Advanced Directives: RN CM will continue to encourage completion of document.   RN CM scheduled next contact call within the next 30 days for Telephonic Monthly Assessment and Care Coordination services as needed. . RN CM advised to please notify MD of any changes in condition prior to scheduled appt's.  RN CM provided contact name and # [phone] or main office # [phone] and 24-hour nurse line # [phone].  RN CM confirmed patient is aware of 911 services for urgent emergency needs.  Nathaneil Canary, BSN, RN, [place] Management Care Management Coordinator [phone] Direct [phone] Cell [phone] Office [phone] Fax [email or web address]

## 2015-09-22 ENCOUNTER — Other Ambulatory Visit: Payer: Self-pay

## 2015-09-22 NOTE — Patient Outreach (Signed)
[facility]) Care Management  09/22/2015  Victoria Levine 11-12-1950 [id number]  Telephonic Care Coordination Services  Contact call to [place], [phone] (phone). Issue:  RN CM will contact provider to confirm fax received and review is in process. H/o Dr. [name] faxed referral 09/15/15.  H/o  (per patient: Referral being sent to Dr. Lynden Oxford). RN CM spoke with contact, Leeanne Mannan., Referral Coordinator; verified no fax received.  Provides direct fax # [phone].  Plan: RN CM will contact Dr. [name], Primary MD, [facility]); Contact: [name] [phone] x [phone] with update.   Nathaneil Canary, BSN, RN, [place] Care Management Care Management Coordinator [phone] Direct [phone] Cell [phone] Office [phone] Fax [name]

## 2015-09-22 NOTE — Patient Outreach (Signed)
[facility]) Care Management  09/22/2015  Victoria Levine 10-Apr-1950 [id number]  Telephonic Care Coordination Services   Contact call to Dr. [name], Primary MD, [facility]); Contact: [name] [phone] with update:  09/15/15 fax not received at [place], [phone] (phone). Per contact, Leeanne Mannan., Referral Coordinator; verified NO fax received. Provides direct fax # [phone].  Plan:   RN CM requested call back from [name] to confirm Referral was sent to the above provider and not a different provider. Please re-fax referral documents again to the above fax #.  Nathaneil Canary, BSN, RN, [place] Care Management Care Management Coordinator [phone] Direct [phone] Cell [phone] Office [phone] Fax Vincenza [email or web address]

## 2015-09-22 NOTE — Patient Outreach (Addendum)
[facility]) Care Management  09/22/2015  AKEILAH DILLAHUNT 07-12-1950 [id number]  Telephonic Care Coordination Services  Contact call to Dr. [name], Primary MD, [facility]);  Contact: Laverta [place] [phone] x [phone] Verifies that referral was faxed on 09/15/15 to  [place], [phone] (phone).  [name] does not know the MD who patient will see if referral accepted.  (per patient:  Referral being sent to Dr. Lynden Oxford).  Laverta [place] is not sure if provider will contact Primary MD or the patient with outcome.  Verified last appt date:  07/27/15   Plan: RN CM will contact provider to confirm fax received and review is in process.   RN CM will contact [name] at Dr. [name] office with update.  RN CM will update patient with any new updates.   Nathaneil Canary, BSN, RN, [place] Management Care Management Coordinator [phone] Direct [phone] Cell [phone] Office [phone] Fax [email or web address]

## 2015-09-26 ENCOUNTER — Other Ambulatory Visit: Payer: Self-pay | Admitting: *Deleted

## 2015-09-26 DIAGNOSIS — M810 Age-related osteoporosis without current pathological fracture: Secondary | ICD-10-CM

## 2015-09-28 ENCOUNTER — Other Ambulatory Visit: Payer: Self-pay

## 2015-09-28 NOTE — Patient Outreach (Signed)
[facility]) Care Management  09/28/2015  SHALISSA WAHEED [birth year] [id number]   Telephonic Care Coordination Services  Inbound voice message received from Dr. [name] office contact: Laverta [place].  States referral faxed again 09/22/15 to  [facility]., Referral Coordinator  [phone] (phone). direct fax # [phone]  Nathaneil Canary, BSN, RN, CCM  [facility] Management Care Management Coordinator [phone] Direct [phone] Cell [phone] Office [phone] Fax Bakari [email or web address]

## 2015-09-29 ENCOUNTER — Telehealth: Payer: Self-pay | Admitting: *Deleted

## 2015-09-29 NOTE — Telephone Encounter (Signed)
Victoria Levine called to get information regarding pt scheduled dexa on 12/13/15 needed authorization faxed to [facility]. [name] needed ICD-10 code, provider NPI information to start process. This was given to her, she will call if further information needed, otherwise pt will come for dexa on 12/13/15.

## 2015-10-19 ENCOUNTER — Ambulatory Visit: Payer: Self-pay

## 2015-10-20 ENCOUNTER — Other Ambulatory Visit: Payer: Self-pay

## 2015-10-20 NOTE — Patient Outreach (Signed)
[facility]) Care Management  10/20/2015  Victoria Levine 08/08/1950 [id number]  Telephonic Monthly Assessment Program:  Other: Pain Management 05/09/15 Insurance: [name] Medicare Advantage  Subjective:  Patient states any activity at all increases her fatigue and pain.   Providers: Primary MD: Dr. [name] - last appt: 07/27/15  Gynecologist: [name], NP - last appt: 11/19/14 - next appt 11/2015 Behavior Health services active with [facility] Counseling: [name] - last appt: 09/29/15 - next appt 10/30/15 Optometrist: Mariann Barter Miller:  Last appt  (2015).  Dentist: Dr. Nicki Reaper Minor (2015) [employer]: Telephonic Nurse call; Contact: [name]  Social: Patient prefers to be called "Victoria Levine."  Patient reached at [phone] Mobility: Divorced and lives alone for about 10 years. Patient is still able to run errands to manage details of her life but not without pain and planning / spacing her trips out over the week.  Falls: No new falls reported since 05/09/15 screening call.  H/o 1 over the past year and sustained a concussion; tripped over a bathroom rug during the night. (patient unable to recall the date of fall). H/o decreased muscle strength due to deconditioning associated to lack of activity due to pain.   Bipolar, Anxiety:  H/o 27 years of being active with AA services and had a sponsor/[name] but sponsor stopped providing support on previous Psych Admission when sponsor discovered that patient had been abusing Oxycodone. H/o no longer attends AA but does attend NA now.   Transportation: yes - self Caregiver: None. H/o a daughter with 3 children 7, 5 and 1 married to [name] and a brother in [place] but does not call on them for any assistance.   Support System: friends, neighbors. Advanced Directives: None but h/o interest in completing. H/o most likely would choose her brother who lives in [place] but has not  discussed with him yet. Plan placed on hold and will complete when she feels appropriate to discuss with her brother due to brother recently had a stroke.  SS: $1,953.00 a month + pension $900.00.  Resources; [employer] forgiven due to poverty level per their assessment.  DME: [name], back brace,Tens Unit, Nebulizer, Glasses:  prescription and reading glasses, scales. (no BP cuff)  Co-morbidities:  Hypothyroidism, GERD, Hypercholesterolemia, Hiatal Hernia, Depression, Bipolar, Anxiety, Chronic Obstructive Asthma, Chronic pain. Osteoporosis, Spondylolisthesis of lumbar region, Herpes.   Chronic Pain -H/o past Opioid abuse with opioid-induced mood disorder (2016) -H/o past Benzodiazepine dependence (2016) -Osteoporosis with back compression fractures on Prolia since 2013. -Spondylolisthesis of lumbar region -H/o Back Surgery 02/05/2010 -H/o a fall when she fell with a leaf blower and rolled 3 times in 2011.  -H/o past chronic pain management with Oxycodone but no longer prescribed. (States she has NOT been on any Oxycodone since out of treatment August 2016). States h/o taking Oxycodone for 5 years prior to that.  Currently manages pain with rest, massage therapy and medications.  BP 144/86 on 09/26/15 MD appt and weight 173.   H/o [facility]: Dr. [name] referred to [facility] for pain management but takes no insurance and charges are $14 per day. Patient states this is not affordable. Pain Management, Dr. [name] in the [facility]: Last appt 05/05/15. H/o patient reported Dr. [name] will no longer prescribe medication/Oxycodone due to past issue with patient using incorrectly. H/o Dr. [name] in the [facility]: Last appt 05/05/15 advised nothing more they can do and no further follow-up appt's scheduled.H/o Primary Care MD:  Dr. [name] advised patient she will not treat pain. Dr. [name] sent Referral on 05/10/2015 to [facility] [phone]; (refused to take patient after review of MR completed).  [employer], Contact: [name] has provided patient with a list of providers. H/o patient previously reported Biomedical scientist, [name] has advised patient to seek pain management outside the [facility] due to having the diagnosis of oxycodone and xanax dependent.  H/o interventions explored with patient this Episode of care:  -[facility] for pain management but takes no insurance and charges are $14 per day. Patient states this is not affordable. -[name] on 05/10/15 [phone]) [phone]- provider is not in-network with [employer] Gold Plus plan and patient did not feel this was affordable.  [facility] [phone] - [employer] Gold in-network: yes. Referral sent on 05/10/2015. Referral reviewed and denied 5/52017. : "Nothing to offer this patient". -RN CM discussed option of other providers and option to go out of network as a patient right. Patient stated she is not able to afford to go out of network and this is not an option.  -Patient states she followed-up with Surgeon:  Dr.Ernesto M. Joya Salm, [facility] June 8th 2017 and had x-ray completed indicating no problems with patient's hardware.   Dr. Joya Salm recommended Pain Management intervention with injections for pain management.  Dr. Joya Salm sent referral to Dr. Lynden Oxford, [place], [phone].  Patient states she has since found out that Dr. Arrie Eastern only provides injections for pain management and is not a pain management center.   -Most recent and New Referral has been sent to Dr. Rochele Pages, [facility], [place] - appt scheduled for 10/28/2015.  ASTHMA H/o Managed by Primary MD due to Specialty co-pay $60.00 and unaffordable.  States symptoms well managed with current medication regime.  States she prefers to use her Nebulizer with  Albuterol if needed over using the prescribed Spiriva due to Spiriva has a $60 dollar co-pay.   Osteoporosis  Denies any falls since 04/2015.  Exercise consist of morning stretching and treadmill 15 minutes 3-4 times a week.  Shopping/grocery trips are very taxing and increase pain. Prolia Injections: 2 injections a year. Last injection was 05/23/15 and due again approximately 11/2015 following Bone Density results. Bone density scheduled for 12/13/15  Provider: [facility], [place].   Medications:  Medication reconciliation completed with patient 10/20/15  Encounter Medications:  Outpatient Encounter Prescriptions as of 10/20/2015  Medication Sig Note  . beclomethasone (QVAR) 80 MCG/ACT inhaler Inhale 1 puff into the lungs as needed (sob). For shortness of breath 09/21/2015: Patient states medication changed over to Flovent due to NOT covered by insurance.  States she has about 20 puffs left on the QVAR before changing over to [employer]. Patient has not started Flovent yet.     . calcium citrate-vitamin D (CALCIUM + D) 315-200 MG-UNIT per tablet Take 1 tablet by mouth daily. For bone health 09/21/2015: Taking again.   Marland Kitchen denosumab (PROLIA) 60 MG/ML SOLN injection Inject 60 mg into the skin every 6 (six) months. Administer in upper arm, thigh, or abdomen   . FLUoxetine (PROZAC) 40 MG capsule Take 1 capsule (40 mg total) by mouth daily. For depression 09/21/2015: Patient taking 20 mg every day.     . gabapentin (NEURONTIN) 300 MG capsule Take 1 capsule (300 mg total) by mouth 2 (two) times daily. For agitation/pain management 06/27/2015: Patient reported on 06/27/2015 she is  taking 600 mg 3 times a day if needed but states only taking twice a day.   . methocarbamol (ROBAXIN) 500 MG tablet Take 500 mg by mouth. 09/21/2015: States taking three times a day.   . Multiple Vitamin (MULTIVITAMIN) tablet Take 1 tablet by mouth daily. For low vitamin 09/21/2015: Patient taking gummies  . naproxen  sodium (ANAPROX) 220 MG tablet Take 220 mg by mouth 2 (two) times daily with a meal. Reported on 06/27/2015 09/21/2015: Taking every night.   . ondansetron (ZOFRAN) 8 MG tablet Take 8 mg by mouth every 8 (eight) hours as needed for nausea or vomiting (Migraine Head Ache symptoms). 08/03/2015: Taking as needed for migraine headaches.   Marland Kitchen oxcarbazepine (TRILEPTAL) 600 MG tablet Take 600 mg by mouth daily. 09/21/2015: Patient states she is taking twice a day.   Marland Kitchen QUEtiapine (SEROQUEL) 50 MG tablet Take 1 tablet by mouth daily. 08/03/2015: Patient takint 100 mg tablets 1-2 tabs at night   . ranitidine (ZANTAC) 300 MG tablet Take 300 mg by mouth at bedtime. 11/19/2014: Received from: External Pharmacy  . rizatriptan (MAXALT-MLT) 10 MG disintegrating tablet as needed. 11/19/2014: Received from: External Pharmacy Received Sig: TAKE 1 TABLET BY MOUTH AS NEEDED MIGRAINE  . valACYclovir (VALTREX) 500 MG tablet Take 500 mg by mouth 2 (two) times daily. 09/21/2015: Takes as needed only.   Marland Kitchen ALPRAZolam (XANAX) 0.5 MG tablet Reported on 09/21/2015 09/21/2015: Patient states NOT TAKING;  no longer has any Alprazolam left; "it is all gone."      . celecoxib (CELEBREX) 200 MG capsule Take 1 capsule (200 mg total) by mouth 2 (two) times daily. For arthritic pain (Patient not taking: Reported on 05/09/2015) 08/23/2015: Patient not taking.  (states did not help and had an expensive co-pay)    . chlordiazePOXIDE (LIBRIUM) 5 MG capsule Take 3 capsules (15 mg total) by mouth 3 (three) times daily. For alcohol detox treatment (Patient not taking: Reported on 06/27/2015) 09/21/2015: States med is gone and not taking any longer.     Marland Kitchen levothyroxine (SYNTHROID) 125 MCG tablet Take 1 tablet (125 mcg total) by mouth daily before breakfast. For low functioning thyroid   . Oxcarbazepine (TRILEPTAL) 300 MG tablet Take 1 tablet (300 mg total) by mouth 2 (two) times daily. Mood stabilization (Patient not taking: Reported on 10/20/2015) 05/27/2015: Patient not  taking: new dosage order.   . pantoprazole (PROTONIX) 40 MG tablet Take 1 tablet (40 mg total) by mouth daily. For acid reflux (Patient not taking: Reported on 05/09/2015) 09/21/2015: Patient not taking; replaced with Ranitidine.   . tiotropium (SPIRIVA) 18 MCG inhalation capsule Place 1 capsule (18 mcg total) into inhaler and inhale daily. For COPD (Patient not taking: Reported on 08/03/2015) 05/27/2015: States out of this medication and has not refilled due to not needed 05/27/2015  . traMADol (ULTRAM) 50 MG tablet Take 50 mg by mouth every 6 (six) hours as needed for moderate pain. Reported on 09/21/2015 09/21/2015: Patient not taking.  Patient attempted to refill but Dr. Lovenia Shuck declined refill without seeing patient on office visit.      No facility-administered encounter medications on file as of 10/20/2015.     Functional Status:  In your present state of health, do you have any difficulty performing the following activities: 08/23/2015 06/28/2015  Hearing? N N  Vision? N N  Difficulty concentrating or making decisions? N N  Walking or climbing stairs? Y Y  Dressing or bathing? N N  Doing errands, shopping? Aggie Moats  Preparing Food and eating ? N N  Using the Toilet? N N  In the past six months, have you accidently leaked urine? N N  Do you have problems with loss of bowel control? N N  Managing your Medications? N N  Managing your Finances? N N  Housekeeping or managing your Housekeeping? Y N  Some recent data might be hidden    Fall/Depression Screening: PHQ 2/9 Scores 10/20/2015 09/21/2015 08/23/2015 05/27/2015 05/09/2015  PHQ - 2 Score 2 0 0 0 0  PHQ- 9 Score 4 - - - -    Fall Risk  10/20/2015 09/21/2015 08/23/2015 06/28/2015 05/27/2015  Falls in the past year? Yes Yes Yes Yes Yes  Number falls in past yr: 1 1 1 1 1   Injury with Fall? Yes Yes Yes Yes Yes  Risk Factor Category  High Fall Risk High Fall Risk - High Fall Risk High Fall Risk  Risk for fall due to : - History of fall(s);Impaired  balance/gait;Impaired mobility;Other (Comment) History of fall(s);Impaired mobility;Medication side effect;Other (Comment);Impaired balance/gait History of fall(s);Impaired balance/gait;Impaired mobility;Medication side effect History of fall(s);Impaired mobility;Medication side effect  Risk for fall due to (comments): - pain osteoporosis and pain  - -  Follow up Falls evaluation completed;Falls prevention discussed Falls evaluation completed;Education provided;Falls prevention discussed Falls evaluation completed;Education provided;Falls prevention discussed Falls evaluation completed;Education provided;Falls prevention discussed Falls evaluation completed;Education provided;Falls prevention discussed    Assessment: Patient remains adherent to engaging with [place].  Patient seeking pain management through correct channels. Patient reports compliance with medication regime.   Plan:  Referral Date: 05/04/2015 Screening: 05/09/2015 Telephonic RN CM start date: 05/09/2015 Program: Other: Pain management: 05/09/2015 Initial Assessment: 05/27/2015 MD Quarterly update: 08/23/2015  Pain Management: RN CM will follow-up Pain Management Referral process and provide assistance as needed.  RN CM will continue to assess patient's use of medications, adherence, and taking as MD prescribes. RN CM suggested patient purchase a digital cuff to self monitor BP at home. Patient stated she would try to do that over the next month.  RN CM provided education on pain effects on BP and could elevate BP readings Emmi Education mailed 10/20/2015 - Hypertension -High Blood Pressure (Hypertension):  Taking Your Blood Pressure -Heart Failure:  About Rest, Exercise, and Blood Pressure -Low Salt Diet  Osteoporosis: RN CM provided education on importance of adherence to scheduling and completing Bone Density testing and importance of having results to assist with authorization needs for continuation of  Prolia Injections.    Fall prevention discussed.  Calcium fortified diet encouraged.   Asthma/Medication Cost: RN CM discussed option of [facility] pharmacy referral to explore cost issues but patient states she prefers to use the nebulizer.    Advanced Directives: RN CM will continue to encourage completion of document.  Patient still needs to speak with family member.   Preventives: RN CM encouraged patient to get flu vaccine fall 2017.   RN CM scheduled next contact call within the next 30 days for Telephonic Monthly Assessment and Care Coordination services as needed. . RN CM advised to please notify MD of any changes in condition prior to scheduled appt's.  RN CM provided contact name and # [phone] or main office # [phone] and 24-hour nurse line # [phone].  RN CM confirmed patient is aware of 911 services for urgent emergency needs.  Nathaneil Canary, BSN, RN, [place] Care Management Care Management Coordinator [phone] Direct [phone] Cell [phone] Office [phone] Fax [email or web address]

## 2015-10-28 DIAGNOSIS — G894 Chronic pain syndrome: Secondary | ICD-10-CM | POA: Diagnosis not present

## 2015-10-28 DIAGNOSIS — M4807 Spinal stenosis, lumbosacral region: Secondary | ICD-10-CM | POA: Diagnosis not present

## 2015-11-17 ENCOUNTER — Other Ambulatory Visit: Payer: Self-pay

## 2015-11-17 DIAGNOSIS — F319 Bipolar disorder, unspecified: Secondary | ICD-10-CM | POA: Diagnosis not present

## 2015-11-17 DIAGNOSIS — F4310 Post-traumatic stress disorder, unspecified: Secondary | ICD-10-CM | POA: Diagnosis not present

## 2015-11-17 DIAGNOSIS — F441 Dissociative fugue: Secondary | ICD-10-CM | POA: Diagnosis not present

## 2015-11-17 NOTE — Patient Outreach (Signed)
[facility]) Care Management  11/17/2015  Victoria Levine [birth year] [id number]  Outreach call #1 to patient to verify address.  H/o recent education mailer returned that was mailed to the following: Victoria Levine [id number] RN CM left HIPAA compliant voice message with name and number requesting call back.  RN CM will resend mail once address verified.   Victoria Levine, BSN, RN, [place] Management Care Management Coordinator [phone] Direct [phone] Cell [phone] Office [phone] Fax [email or web address]

## 2015-11-22 ENCOUNTER — Encounter: Payer: Self-pay | Admitting: Women's Health

## 2015-11-22 ENCOUNTER — Telehealth: Payer: Self-pay | Admitting: Gynecology

## 2015-11-22 ENCOUNTER — Ambulatory Visit (INDEPENDENT_AMBULATORY_CARE_PROVIDER_SITE_OTHER): Payer: Commercial Managed Care - HMO | Admitting: Women's Health

## 2015-11-22 ENCOUNTER — Other Ambulatory Visit: Payer: Self-pay

## 2015-11-22 ENCOUNTER — Other Ambulatory Visit: Payer: Self-pay | Admitting: Women's Health

## 2015-11-22 VITALS — BP 124/80 | Ht 64.0 in | Wt 177.0 lb

## 2015-11-22 DIAGNOSIS — Z13820 Encounter for screening for osteoporosis: Secondary | ICD-10-CM

## 2015-11-22 DIAGNOSIS — Z01419 Encounter for gynecological examination (general) (routine) without abnormal findings: Secondary | ICD-10-CM | POA: Diagnosis not present

## 2015-11-22 DIAGNOSIS — M810 Age-related osteoporosis without current pathological fracture: Secondary | ICD-10-CM

## 2015-11-22 NOTE — Telephone Encounter (Signed)
Prolia injection due after 11/24/15, Insurance benefits No deductible, Co pay with or without  OV $45,  Co insurance 20% approx ($420) OOPM , $526 met. PA Needed Reference nuimber [id number]  Valid 05/20/2015 thru 05/19/2017  Per [facility] [place] [facility]. This patient does not have a PA thru [employer] and the previous information given is not correct. New PA needed and also Referral needed form PCP.   Calcium level 9.6  06/02/2015  I will forward and discuss this with Sharrie Rothman

## 2015-11-22 NOTE — Patient Outreach (Signed)
[facility]) Care Management  11/22/2015  Victoria Levine 01/24/1951 [id number]  Telephonic Monthly Assessment  Outreach call #1 to patient for monthly assessment and address verification:   H/o recent education mailer returned that was mailed to the following: [place] [id number]   [name], [name], BSN, RN, [place] Management Care Management Coordinator [phone] Direct [phone] Cell [phone] Office [phone] Fax [email or web address]

## 2015-11-22 NOTE — Progress Notes (Signed)
Victoria Levine 1950/05/03 [id number]    History:    Presents for breast and pelvic exam. Normal pap and mammogram history. Postmenopausal on no HRT with no bleeding. Osteoporosis on Prolia since 2013, dose due.  Biggest problem is chronic back pain, has had several back surgeries. Now working with a pain clinic. Had been on opiods for 6 years and then off for one year with severe pain which is now becoming bearable. Sober for > 20 years. Bipolar.not sexually active for years   Past medical history, past surgical history, family history and social history were all reviewed and documented in the EPIC chart. Adopted. 1 daughter lives in [place].  ROS:  A ROS was performed and pertinent positives and negatives are included.  Exam:  Vitals:   11/22/15 1453  BP: 124/80  Weight: 177 lb (80.3 kg)  Height: 5\' 4"  (1.626 m)   Body mass index is 30.38 kg/m.   General appearance:  Normal Thyroid:  Symmetrical, normal in size, without palpable masses or nodularity. Respiratory  Auscultation:  Clear without wheezing or rhonchi Cardiovascular  Auscultation:  Regular rate, without rubs, murmurs or gallops  Edema/varicosities:  Not grossly evident Abdominal  Soft,nontender, without masses, guarding or rebound.  Liver/spleen:  No organomegaly noted  Hernia:  None appreciated  Skin  Inspection:  Grossly normal   Breasts: Examined lying and sitting.     Right: Without masses, retractions, discharge or axillary adenopathy.     Left: Without masses, retractions, discharge or axillary adenopathy. Gentitourinary   Inguinal/mons:  Normal without inguinal adenopathy  External genitalia:  Normal  BUS/Urethra/Skene's glands:  Normal  Vagina:  atrophic  Cervix:  Normal  Uterus: , normal in size, shape and contour.  Midline and mobile  Adnexa/parametria:     Rt: Without masses or tenderness.   Lt: Without masses or tenderness.  Anus and perineum: Normal  Digital rectal exam: Normal sphincter  tone without palpated masses or tenderness  Assessment/Plan:  65 y.o.DWF G1P1  For breast and pelvic exam.  Postmenopausal on no HRT with no bleeding Osteoporosis on Prolia with improvement since 10/2011 Chronic back pain - Pain management  Hypothyroid , GERD, Asthma - PC managing. Bipolar PC managing  Pllan: keep scheduled dexa, prolia 60 mg sub q every 6 months.  Get calcium level with blood draw and fax results.  Due for TSH at  [facility].  [facility] vaccine [facility].  SBE's, continue annual screening mammogram, increase daily walking after pain more in control. Calcium rich diet, vit d 1000 daily.  Home safety, fall prevention reviewed.Marland Kitchen        [name] [facility], 7:48 PM 9/5/20178/

## 2015-11-22 NOTE — Patient Instructions (Signed)
Chronic Back Pain  When back pain lasts longer than 3 months, it is called chronic back pain.People with chronic back pain often go through certain periods that are more intense (flare-ups).  CAUSES Chronic back pain can be caused by wear and tear (degeneration) on different structures in your back. These structures include:  The bones of your spine (vertebrae) and the joints surrounding your spinal cord and nerve roots (facets).  The strong, fibrous tissues that connect your vertebrae (ligaments). Degeneration of these structures may result in pressure on your nerves. This can lead to constant pain. HOME CARE INSTRUCTIONS  Avoid bending, heavy lifting, prolonged sitting, and activities which make the problem worse.  Take brief periods of rest throughout the day to reduce your pain. Lying down or standing usually is better than sitting while you are resting.  Take over-the-counter or prescription medicines only as directed by your caregiver. SEEK IMMEDIATE MEDICAL CARE IF:   You have weakness or numbness in one of your legs or feet.  You have trouble controlling your bladder or bowels.  You have nausea, vomiting, abdominal pain, shortness of breath, or fainting.   This information is not intended to replace advice given to you by your health care provider. Make sure you discuss any questions you have with your health care provider.   Document Released: 04/12/2004 Document Revised: 05/28/2011 Document Reviewed: 08/23/2014 Elsevier Interactive Patient Education [employer]. Menopause is a normal process in which your reproductive ability comes to an end. This process happens gradually over a span of months to years, usually between the ages of 59 and 13. Menopause is complete when you have missed 12 consecutive menstrual periods. It is important to talk with your health care provider about some of the most common conditions that affect postmenopausal women, such as heart disease,  cancer, and bone loss (osteoporosis). Adopting a healthy lifestyle and getting preventive care can help to promote your health and wellness. Those actions can also lower your chances of developing some of these common conditions. WHAT SHOULD I KNOW ABOUT MENOPAUSE? During menopause, you may experience a number of symptoms, such as:  Moderate-to-severe hot flashes.  Night sweats.  Decrease in sex drive.  Mood swings.  Headaches.  Tiredness.  Irritability.  Memory problems.  Insomnia. Choosing to treat or not to treat menopausal changes is an individual decision that you make with your health care provider. WHAT SHOULD I KNOW ABOUT HORMONE REPLACEMENT THERAPY AND SUPPLEMENTS? Hormone therapy products are effective for treating symptoms that are associated with menopause, such as hot flashes and night sweats. Hormone replacement carries certain risks, especially as you become older. If you are thinking about using estrogen or estrogen with progestin treatments, discuss the benefits and risks with your health care provider. WHAT SHOULD I KNOW ABOUT HEART DISEASE AND STROKE? Heart disease, heart attack, and stroke become more likely as you age. This may be due, in part, to the hormonal changes that your body experiences during menopause. These can affect how your body processes dietary fats, triglycerides, and cholesterol. Heart attack and stroke are both medical emergencies. There are many things that you can do to help prevent heart disease and stroke:  Have your blood pressure checked at least every 1-2 years. High blood pressure causes heart disease and increases the risk of stroke.  If you are 31-90 years old, ask your health care provider if you should take aspirin to prevent a heart attack or a stroke.  Do not use any tobacco  products, including cigarettes, chewing tobacco, or electronic cigarettes. If you need help quitting, ask your health care provider.  It is important to eat  a healthy diet and maintain a healthy weight.  Be sure to include plenty of vegetables, fruits, low-fat dairy products, and lean protein.  Avoid eating foods that are high in solid fats, added sugars, or salt (sodium).  Get regular exercise. This is one of the most important things that you can do for your health.  Try to exercise for at least 150 minutes each week. The type of exercise that you do should increase your heart rate and make you sweat. This is known as moderate-intensity exercise.  Try to do strengthening exercises at least twice each week. Do these in addition to the moderate-intensity exercise.  Know your numbers.Ask your health care provider to check your cholesterol and your blood glucose. Continue to have your blood tested as directed by your health care provider. WHAT SHOULD I KNOW ABOUT CANCER SCREENING? There are several types of cancer. Take the following steps to reduce your risk and to catch any cancer development as early as possible. Breast Cancer  Practice breast self-awareness.  This means understanding how your breasts normally appear and feel.  It also means doing regular breast self-exams. Let your health care provider know about any changes, no matter how small.  If you are 81 or older, have a clinician do a breast exam (clinical breast exam or CBE) every year. Depending on your age, family history, and medical history, it may be recommended that you also have a yearly breast X-ray (mammogram).  If you have a family history of breast cancer, talk with your health care provider about genetic screening.  If you are at high risk for breast cancer, talk with your health care provider about having an MRI and a mammogram every year.  Breast cancer (BRCA) gene test is recommended for women who have family members with BRCA-related cancers. Results of the assessment will determine the need for genetic counseling and BRCA1 and for BRCA2 testing. BRCA-related  cancers include these types:  Breast. This occurs in males or females.  Ovarian.  Tubal. This may also be called fallopian tube cancer.  Cancer of the abdominal or pelvic lining (peritoneal cancer).  Prostate.  Pancreatic. Cervical, Uterine, and Ovarian Cancer Your health care provider may recommend that you be screened regularly for cancer of the pelvic organs. These include your ovaries, uterus, and vagina. This screening involves a pelvic exam, which includes checking for microscopic changes to the surface of your cervix (Pap test).  For women ages 21-65, health care providers may recommend a pelvic exam and a Pap test every three years. For women ages 69-65, they may recommend the Pap test and pelvic exam, combined with testing for human papilloma virus (HPV), every five years. Some types of HPV increase your risk of cervical cancer. Testing for HPV may also be done on women of any age who have unclear Pap test results.  Other health care providers may not recommend any screening for nonpregnant women who are considered low risk for pelvic cancer and have no symptoms. Ask your health care provider if a screening pelvic exam is right for you.  If you have had past treatment for cervical cancer or a condition that could lead to cancer, you need Pap tests and screening for cancer for at least 20 years after your treatment. If Pap tests have been discontinued for you, your risk factors (such as  having a new sexual partner) need to be reassessed to determine if you should start having screenings again. Some women have medical problems that increase the chance of getting cervical cancer. In these cases, your health care provider may recommend that you have screening and Pap tests more often.  If you have a family history of uterine cancer or ovarian cancer, talk with your health care provider about genetic screening.  If you have vaginal bleeding after reaching menopause, tell your health care  provider.  There are currently no reliable tests available to screen for ovarian cancer. Lung Cancer Lung cancer screening is recommended for adults 34-10 years old who are at high risk for lung cancer because of a history of smoking. A yearly low-dose CT scan of the lungs is recommended if you:  Currently smoke.  Have a history of at least 30 pack-years of smoking and you currently smoke or have quit within the past 15 years. A pack-year is smoking an average of one pack of cigarettes per day for one year. Yearly screening should:  Continue until it has been 15 years since you quit.  Stop if you develop a health problem that would prevent you from having lung cancer treatment. Colorectal Cancer  This type of cancer can be detected and can often be prevented.  Routine colorectal cancer screening usually begins at age 58 and continues through age 3.  If you have risk factors for colon cancer, your health care provider may recommend that you be screened at an earlier age.  If you have a family history of colorectal cancer, talk with your health care provider about genetic screening.  Your health care provider may also recommend using home test kits to check for hidden blood in your stool.  A small camera at the end of a tube can be used to examine your colon directly (sigmoidoscopy or colonoscopy). This is done to check for the earliest forms of colorectal cancer.  Direct examination of the colon should be repeated every 5-10 years until age 48. However, if early forms of precancerous polyps or small growths are found or if you have a family history or genetic risk for colorectal cancer, you may need to be screened more often. Skin Cancer  Check your skin from head to toe regularly.  Monitor any moles. Be sure to tell your health care provider:  About any new moles or changes in moles, especially if there is a change in a mole's shape or color.  If you have a mole that is larger  than the size of a pencil eraser.  If any of your family members has a history of skin cancer, especially at a Victoria Levine age, talk with your health care provider about genetic screening.  Always use sunscreen. Apply sunscreen liberally and repeatedly throughout the day.  Whenever you are outside, protect yourself by wearing long sleeves, pants, a wide-brimmed hat, and sunglasses. WHAT SHOULD I KNOW ABOUT OSTEOPOROSIS? Osteoporosis is a condition in which bone destruction happens more quickly than new bone creation. After menopause, you may be at an increased risk for osteoporosis. To help prevent osteoporosis or the bone fractures that can happen because of osteoporosis, the following is recommended:  If you are 44-42 years old, get at least 1,000 mg of calcium and at least 600 mg of vitamin D per day.  If you are older than age 55 but younger than age 36, get at least 1,200 mg of calcium and at least 600 mg of  vitamin D per day.  If you are older than age 18, get at least 1,200 mg of calcium and at least 800 mg of vitamin D per day. Smoking and excessive alcohol intake increase the risk of osteoporosis. Eat foods that are rich in calcium and vitamin D, and do weight-bearing exercises several times each week as directed by your health care provider. WHAT SHOULD I KNOW ABOUT HOW MENOPAUSE AFFECTS Victoria Levine? Depression may occur at any age, but it is more common as you become older. Common symptoms of depression include:  Low or sad mood.  Changes in sleep patterns.  Changes in appetite or eating patterns.  Feeling an overall lack of motivation or enjoyment of activities that you previously enjoyed.  Frequent crying spells. Talk with your health care provider if you think that you are experiencing depression. WHAT SHOULD I KNOW ABOUT IMMUNIZATIONS? It is important that you get and maintain your immunizations. These include:  Tetanus, diphtheria, and pertussis (Tdap) booster  vaccine.  Influenza every year before the flu season begins.  Pneumonia vaccine.  Shingles vaccine. Your health care provider may also recommend other immunizations.   This information is not intended to replace advice given to you by your health care provider. Make sure you discuss any questions you have with your health care provider.   Document Released: 04/27/2005 Document Revised: 03/26/2014 Document Reviewed: 11/05/2013 Elsevier Interactive Patient Education [employer].

## 2015-11-22 NOTE — Telephone Encounter (Signed)
Victoria Levine I need a referral for this patient from her PCP can you help me with this for her Prolia injection??  Her diagnosis is M81.0  Osteoporosis?  PCP [name], MD

## 2015-11-23 ENCOUNTER — Other Ambulatory Visit: Payer: Self-pay | Admitting: Women's Health

## 2015-11-23 ENCOUNTER — Other Ambulatory Visit: Payer: Self-pay

## 2015-11-23 NOTE — Telephone Encounter (Signed)
I called and left a message at PCP office regarding this, will wait for response.

## 2015-11-23 NOTE — Telephone Encounter (Signed)
I spoke with Gae Bon at [facility] and the authorization code for referral is [id number] until 05/21/16 or for 6 visits.

## 2015-11-23 NOTE — Patient Outreach (Signed)
[facility]) Care Management  11/23/2015  Victoria Levine 1950-10-17 [id number]  Telephonic Monthly Assessment Program: Other: Pain Management 05/09/15 Insurance: [name] Medicare Advantage  Subjective:  Patient states she is so happy that she has a Pain Management MD who has accepted her as a patient.  States Dr. Rochele Pages consulted with Dr. Joya Salm regarding patients case.   Patient states she has completed one appt and next appt is scheduled this week.   States MD started patient on Oxycodone 10 mg three times a day and pain level is down from a "20 to a 7".   Patient states she has been off all pain medications / narcotics for over a year.   States she has been taking 1 1/2 tablets in the morning; 1/2 - 1 tablet at lunch and 1/2 tablet at bedtime.   Providers: Primary MD: Dr. [name]- last appt: 07/27/15.  Patient to call for appt to be scheduled due to needing lab work.  States she will call today.  Gynecologist: [name], NP - last appt: 11/19/14 - next appt 11/2015 Behavior Health services activewith [facility]: [name] - last appt: 09/29/15 - next appt 10/30/15 Pain MD:  Dr. Rochele Pages, [facility], [place] - last and first appt 10/28/2015 - next appt 11/25/15.  Optometrist: Dr.Salley Miller: Last appt (2015).  Dentist: Dr. Nicki Reaper Minor (2015) [employer]: Telephonic Nurse call; Contact: [name]  Social: Patient prefers to be called "Victoria Levine." Patient reached at [phone] Mobility: Divorced and lives alone for about 10 years. Patient is still able to run errands to manage details of her life but not without pain and planning / spacing her trips out over the week.  Falls: No new falls reported since 05/09/15 screening call.  H/o 1 over the past year and sustained a concussion; tripped over a bathroom rug during the night. (patient unable to recall the date of fall). H/o decreased muscle strength due to deconditioning  associated to lack of activity due to pain.   Bipolar, Anxiety:  H/o 27 years of being active with AA services and had a sponsor/[name] but sponsor stopped providing support on previous Psych Admission when sponsor discovered that patient had been abusing Oxycodone. H/o no longer attends AA but does attend NA now. Transportation: yes - self Caregiver: None. H/o a daughter with 3 children 7, 5 and 1 married to [name] and a brother in [place] but does not call on them for any assistance.   Support System: friends, neighbors. Advanced Directives: None but h/o interest in completing. H/o most likely would choose her brother who lives in [place] but has not discussed with him yet.Plan placed on hold and will complete when she feels appropriate to discuss with her brother due to brother recently had a stroke. SS: $1,953.00 a month + pension $900.00.  Resources; [employer] forgiven due to poverty level per their assessment.  DME: [name], back brace,Tens Unit, Nebulizer, Glasses: prescription and reading glasses, scales. (no BP cuff)  Co-morbidities: Hypothyroidism, GERD, Hypercholesterolemia, Hiatal Hernia, Depression, Bipolar, Anxiety, Chronic Obstructive Asthma, Chronic pain. Osteoporosis, Spondylolisthesis of lumbar region, Herpes.   Chronic Pain -H/o past Opioid abuse with opioid-induced mood disorder (2016) -H/o past Benzodiazepine dependence (2016) -Osteoporosis with back compression fractures on Prolia since 2013. -Spondylolisthesis of lumbar region -H/o Back Surgery 02/05/2010 -H/o a fall when she fell with a leaf blower and rolled 3 times in 2011.  -H/o past chronic pain management with Oxycodone but no longer prescribed. (States she  has NOT been on any Oxycodone since out of treatment August 2016). States h/o taking Oxycodone for 5 years prior to that.  Currently manages pain with rest, massage therapy and medications.  BP 144/86  Weight  173.   H/o [facility]: Dr. [name] referred to [facility] for pain management but takes no insurance and charges are $14 per day. Patient states this is not affordable.Pain Management, Dr. [name] in the [facility]: Last appt 05/05/15. H/o patient reported Dr. [name] will no longer prescribe medication/Oxycodone due to past issue with patient using incorrectly. H/o Dr. [name] in the [facility]: Last appt 05/05/15 advised nothing more they can do and no further follow-up appt's scheduled.H/o Primary Care MD: Dr. [name] advised patient she will not treat pain. Dr. [name] sent Referral on 05/10/2015 to [facility] [phone]; (refused to take patient after review of MR completed).  [employer], Contact: [name] has provided patient with a list of providers. H/o patient previously reported Biomedical scientist, [name] has advised patient to seek pain management outside the [place] due to having the diagnosis of oxycodone and xanax dependent.  H/o interventions explored with patient this Episode of care:  -[facility] for pain management but takes no insurance and charges are $14 per day. Patient states this is not affordable. -[name] on 05/10/15 [phone]) [phone]- provider is not in-network with [employer] Gold Plus plan and patient did not feel this was affordable.  [facility] [phone] - [employer] Gold in-network: yes. Referral sent on 05/10/2015. Referral reviewed and denied 5/52017. : "Nothing to offer this patient". -RN CM discussed option of other providers and option to go out of network as a patient right. Patient stated she is not able to afford to go out of network and this is not an option.  -Patient states she followed-up with Surgeon: Dr.Ernesto M. Joya Salm, [facility] June 8th 2017 and had x-ray completed indicating no problems with patient's  hardware.  Dr. Joya Salm recommended Pain Management intervention with injections for pain management. Dr. Joya Salm sent referral to Dr. Lynden Oxford, [facility], [phone]. Patient states she has since found out that Dr. Arrie Eastern only provides injections for pain management and is not a pain management center.   -Active with Dr. Rochele Pages for services as of 10/2015.  Patient states she is so happy that she has a Pain Management MD who has accepted her as a patient.  States Dr. Rochele Pages consulted with Dr. Joya Salm regarding patients case.   Patient states she has completed one appt and next appt is scheduled this week.   States MD started patient on Oxycodone 10 mg three times a day and pain level is down from a "20 to a 7".   Patient states she has been off all pain medications / narcotics for over a year.   States she has been taking 1 1/2 tablets in the morning; 1/2 - 1 tablet at lunch and 1/2 tablet at bedtime.   ASTHMA H/o Managed by Primary MD due to Specialty co-pay $60.00 and unaffordable.  States symptoms well managed with current medication regime.  States she prefers to use her Nebulizer with Albuterol if needed over using the prescribed Spiriva due to Spiriva has a $60 dollar co-pay.   Osteoporosis  Denies any falls since 04/2015.  Exercise consist of morning stretching and treadmill 15 minutes 3-4 times a week.  Shopping/grocery trips are very taxing and  increase pain. Prolia Injections: 2 injections a year. Last injection was 05/23/15 and due again approximately 11/2015 following Bone Density results. Bone density scheduled for 12/13/15  Provider: [facility] [place].   Medications:  Medication reconciliation completed with patient 11/23/15  Encounter Medications:  Outpatient Encounter Prescriptions as of 11/23/2015  Medication Sig Note  . calcium citrate-vitamin D (CALCIUM + D) 315-200 MG-UNIT per tablet Take 1 tablet by  mouth daily. For bone health 09/21/2015: Taking again.   Marland Kitchen denosumab (PROLIA) 60 MG/ML SOLN injection Inject 60 mg into the skin every 6 (six) months. Administer in upper arm, thigh, or abdomen   . FLUoxetine (PROZAC) 20 MG capsule Take 20 mg by mouth daily.   . Fluticasone Propionate, Inhal, (FLOVENT DISKUS) 100 MCG/BLIST AEPB Inhale into the lungs.   . gabapentin (NEURONTIN) 300 MG capsule Take 1 capsule (300 mg total) by mouth 2 (two) times daily. For agitation/pain management 06/27/2015: Patient reported on 06/27/2015 she is taking 600 mg 3 times a day if needed but states only taking twice a day.   . levothyroxine (SYNTHROID) 125 MCG tablet Take 1 tablet (125 mcg total) by mouth daily before breakfast. For low functioning thyroid   . methocarbamol (ROBAXIN) 500 MG tablet Take 500 mg by mouth. 09/21/2015: States taking three times a day.   . Multiple Vitamin (MULTIVITAMIN) tablet Take 1 tablet by mouth daily. For low vitamin 09/21/2015: Patient taking gummies  . naproxen sodium (ANAPROX) 220 MG tablet Take 220 mg by mouth 2 (two) times daily with a meal. Reported on 06/27/2015 09/21/2015: Taking every night.   . ondansetron (ZOFRAN) 8 MG tablet Take 8 mg by mouth every 8 (eight) hours as needed for nausea or vomiting (Migraine Head Ache symptoms). 08/03/2015: Taking as needed for migraine headaches.   Marland Kitchen oxcarbazepine (TRILEPTAL) 600 MG tablet Take 600 mg by mouth 2 (two) times daily.    . Oxycodone HCl 10 MG TABS Take 10 mg by mouth every 8 (eight) hours.  11/22/2015: Received from: External Pharmacy  . QUEtiapine (SEROQUEL) 50 MG tablet Take 1 tablet by mouth daily. 08/03/2015: Patient takint 100 mg tablets 1-2 tabs at night   . ranitidine (ZANTAC) 300 MG tablet Take 300 mg by mouth at bedtime. 11/19/2014: Received from: External Pharmacy  . rizatriptan (MAXALT-MLT) 10 MG disintegrating tablet as needed. 11/19/2014: Received from: External Pharmacy Received Sig: TAKE 1 TABLET BY MOUTH AS NEEDED MIGRAINE  .  valACYclovir (VALTREX) 500 MG tablet Take 500 mg by mouth 2 (two) times daily. 09/21/2015: Takes as needed only.   Marland Kitchen FLUoxetine (PROZAC) 40 MG capsule Take 40 mg by mouth daily. 11/22/2015: Received from: [place]: Take 40 mg by mouth.   No facility-administered encounter medications on file as of 11/23/2015.     Functional Status:  In your present state of health, do you have any difficulty performing the following activities: 08/23/2015 06/28/2015  Hearing? N N  Vision? N N  Difficulty concentrating or making decisions? N N  Walking or climbing stairs? Y Y  Dressing or bathing? N N  Doing errands, shopping? Y N  Preparing Food and eating ? N N  Using the Toilet? N N  In the past six months, have you accidently leaked urine? N N  Do you have problems with loss of bowel control? N N  Managing your Medications? N N  Managing your Finances? N N  Housekeeping or managing your Housekeeping? Y N  Some recent data might be hidden  Fall/Depression Screening: PHQ 2/9 Scores 10/20/2015 09/21/2015 08/23/2015 05/27/2015 05/09/2015  PHQ - 2 Score 2 0 0 0 0  PHQ- 9 Score 4 - - - -    Fall Risk  11/24/2015 10/20/2015 09/21/2015 08/23/2015 06/28/2015  Falls in the past year? Yes Yes Yes Yes Yes  Number falls in past yr: 1 1 1 1 1   Injury with Fall? Yes Yes Yes Yes Yes  Risk Factor Category  High Fall Risk High Fall Risk High Fall Risk - High Fall Risk  Risk for fall due to : History of fall(s);Impaired balance/gait;Impaired mobility;Other (Comment) - History of fall(s);Impaired balance/gait;Impaired mobility;Other (Comment) History of fall(s);Impaired mobility;Medication side effect;Other (Comment);Impaired balance/gait History of fall(s);Impaired balance/gait;Impaired mobility;Medication side effect  Risk for fall due to (comments): chronic pain  - pain osteoporosis and pain  -  Follow up Falls evaluation completed;Education provided;Falls prevention discussed Falls evaluation completed;Falls  prevention discussed Falls evaluation completed;Education provided;Falls prevention discussed Falls evaluation completed;Education provided;Falls prevention discussed Falls evaluation completed;Education provided;Falls prevention discussed    Assessment: Patient remains adherent to engaging with [facility].  Patient seeking pain management through correct channels. Patient reports compliance with medication regime.   Plan:  Referral Date: 05/04/2015 Screening: 05/09/2015 Telephonic RN CM start date: 05/09/2015 Program: Other: Pain management: 05/09/2015 Initial Assessment: 05/27/2015 MD Quarterly update: 08/23/2015, 11/23/15  Pain Management: RN CM will continue to assess patient's use of medications, adherence, and taking as MD prescribes. RN CM provided verbal education on importance of taking medication as ordered (Oxycodone 10 mg - 1 tablet every 8 hours.  RN CM advised not to self medicate or self dose by adding and taking away 1/2 tablet.  RN CM provided verbal education on importance of maintaining pain management level in the bloodstream at all times and this can not happen with the peaks and valleys of self dosing.  RN CM advised that MD will expect patient to take as ordered and not to expect MD to provide services if patient becomes unaccountable to directions, trust and management.  RN CM reminded of difficulty finding MD to treat pain due to past non-adherence and self dosing issues.  Patient verbalized understanding.   HTN:  RN CM suggested patient purchase a digital cuff to self monitor BP at home. RN CM provided education on pain effects on BP and could elevate BP readings Emmi Education mailed 10/20/2015 (mail returned)  RN CM verified address and will re-mail 11/24/15.  - Hypertension -High Blood Pressure (Hypertension):  Taking Your Blood Pressure -Heart Failure:  About Rest, Exercise, and Blood Pressure -Low Salt Diet  Osteoporosis: RN CM provided education on  importance of adherence to scheduling and completing Bone Density testing and importance of having results to assist with authorization needs for continuation of Prolia Injections.   Fall prevention discussed.  Calcium fortified diet encouraged.  Patient to call Primary for MD appt to schedule labs needed for Prolia management.   Advanced Directives: RN CM will continue to encourage completion of document.  Patient still needs to speak with family member.    Preventives: RN CM encouraged patient to get flu vaccine fall 2017.   RN CM scheduled next contact call within the next 30 days for Telephonic Monthly Assessment and Care Coordination services as needed. . RN CM advised to please notify MD of any changes in condition prior to scheduled appt's.  RN CM provided contact name and # [phone] or main office # [phone] and 24-hour nurse line # [phone].  RN CM  confirmed patient is aware of 911 services for urgent emergency needs  [name], MSHL, BSN, RN, [place] [facility] Care Management Coordinator [phone] Direct [phone] Cell [phone] Office [phone] Fax [email or web address]

## 2015-11-24 ENCOUNTER — Ambulatory Visit: Payer: Self-pay

## 2015-11-25 DIAGNOSIS — G894 Chronic pain syndrome: Secondary | ICD-10-CM | POA: Diagnosis not present

## 2015-11-25 DIAGNOSIS — M4807 Spinal stenosis, lumbosacral region: Secondary | ICD-10-CM | POA: Diagnosis not present

## 2015-11-25 DIAGNOSIS — M4316 Spondylolisthesis, lumbar region: Secondary | ICD-10-CM | POA: Diagnosis not present

## 2015-11-29 DIAGNOSIS — Z23 Encounter for immunization: Secondary | ICD-10-CM | POA: Diagnosis not present

## 2015-11-29 DIAGNOSIS — R6889 Other general symptoms and signs: Secondary | ICD-10-CM | POA: Diagnosis not present

## 2015-11-29 DIAGNOSIS — Z5181 Encounter for therapeutic drug level monitoring: Secondary | ICD-10-CM | POA: Diagnosis not present

## 2015-11-29 DIAGNOSIS — E039 Hypothyroidism, unspecified: Secondary | ICD-10-CM | POA: Diagnosis not present

## 2015-11-29 DIAGNOSIS — J45901 Unspecified asthma with (acute) exacerbation: Secondary | ICD-10-CM | POA: Diagnosis not present

## 2015-12-06 ENCOUNTER — Telehealth: Payer: Self-pay

## 2015-12-06 NOTE — Telephone Encounter (Signed)
Patient said she is to have a Prolia shot in a few weeks and she had her Calcium level checked and result is 10. She wants to know if that's acceptable for proceeding with Prolia.

## 2015-12-06 NOTE — Telephone Encounter (Signed)
Patient informed. She promised to bring it with her day of Prolia injection.

## 2015-12-06 NOTE — Telephone Encounter (Signed)
Yes that is in the normal range can she fax results to our office? Or bring copy day she comes for her Prolia?

## 2015-12-08 NOTE — Telephone Encounter (Signed)
PC to patient, calcium at PCP 10.0 she will bring in copy of Calcium level. Referral sent to [employer] waiting on PA, Explained insurance benefits. Pt has appointment on 12/13/15 for prolia and bone density. She will keep this appointment and understands if no PA no Prolia injection. I will call her ASAP with PA .

## 2015-12-12 ENCOUNTER — Other Ambulatory Visit: Payer: Self-pay | Admitting: Gynecology

## 2015-12-12 DIAGNOSIS — M810 Age-related osteoporosis without current pathological fracture: Secondary | ICD-10-CM

## 2015-12-13 ENCOUNTER — Ambulatory Visit: Payer: Commercial Managed Care - HMO

## 2015-12-13 NOTE — Telephone Encounter (Signed)
Received insurance approval from [facility]  [place] ref# [id number]  11/30/15  Thru 11/28/2016  120 units  . She was confused about the bone density and Prolia so missed her appointment we will r/s and she will receive injection on the same day as BD

## 2015-12-14 NOTE — Telephone Encounter (Signed)
Pt has been sick will call next week to schedule injection and bone density

## 2015-12-15 ENCOUNTER — Encounter: Payer: Self-pay | Admitting: Gynecology

## 2015-12-22 ENCOUNTER — Ambulatory Visit: Payer: Self-pay

## 2015-12-26 NOTE — Telephone Encounter (Signed)
Appointment for Prolia and bone density on 01/09/16

## 2015-12-29 DIAGNOSIS — J069 Acute upper respiratory infection, unspecified: Secondary | ICD-10-CM | POA: Diagnosis not present

## 2015-12-29 DIAGNOSIS — J45901 Unspecified asthma with (acute) exacerbation: Secondary | ICD-10-CM | POA: Diagnosis not present

## 2015-12-29 DIAGNOSIS — R509 Fever, unspecified: Secondary | ICD-10-CM | POA: Diagnosis not present

## 2016-01-02 ENCOUNTER — Other Ambulatory Visit: Payer: Self-pay

## 2016-01-02 NOTE — Patient Outreach (Addendum)
[facility]) Care Management  01/02/2016  Victoria Levine 1950-07-28 [id number]   Telephonic Monthly Assessment Program: Other: Pain Management 05/09/15 Insurance: [name] Plus Medicare Advantage  Subjective:  Patient states pain is improving.   Providers: Primary MD: Dr. Ivin Booty Wolters-12/30/15 Gynecologist: [name], NP - last appt 11/2015 Behavior Health services activewith [facility]: [name]  Pain MD:  Dr. Rochele Pages, [facility], [place] - last appt 11/25/15.  Optometrist: Dr.Salley Miller: Last appt (2015).  Dentist: Dr. Nicki Reaper Minor (2015)  Social: Patient prefers to be called "[name]." Patient reached at [phone] H/o Divorced and lives alone for about 10 years. Patient is still able to run errands to manage details of her life but not without pain and planning / spacing her trips out over the week. Last fall date: 05/09/15.  Bipolar, Anxiety:  H/o 27 years of being active with AA services and had a sponsor/[name] but sponsor stopped providing support on previous Psych Admission when sponsor discovered that patient had been abusing Oxycodone. H/o no longer attends AA but does attend NA now. Transportation: yes - self Caregiver: None. H/o a daughter with 3 children 7, 5 and 1 married to [name] and a brother in [place] but does not call on them for any assistance.   Support System: friends, neighbors. Advanced Directives: None but h/o interest in completing. H/o most likely would choose her brother who lives in [place] but has not discussed with him yet.Patient confirms she has the document saved and still plans to address in the future.  Resources; -SS: $1,953.00 a month + pension $900.00.  -H/o College loan forgiven due to poverty level per their assessment.  DME: [name], back brace,Tens Unit, Nebulizer, Glasses: prescription and reading glasses, scales, BP  cuff  Co-morbidities: Hypothyroidism, GERD, Hypercholesterolemia, Hiatal Hernia, Depression, Bipolar, Anxiety, Chronic Obstructive Asthma, Chronic pain. Osteoporosis, Spondylolisthesis of lumbar region, Herpes.   Chronic Pain -H/o past Opioid abuse with opioid-induced mood disorder (2016) -H/o past Benzodiazepine dependence (2016) -Osteoporosis with back compression fractures on Prolia since 2013. -Spondylolisthesis of lumbar region -H/o Back Surgery 02/05/2010 -H/o a fall when she fell with a leaf blower and rolled 3 times in 2011.  Chronic pain managed with Oxycodone 10 mg every eight hours; gabapentin and Methocarbamol (Robaxin). .  Patient reports improvement in pain level but believes she could increase her dosage to 15 mg and improve her management.  Patient states she will not ask her MD to increase the dosage out of fear he will stop prescribing as this has been an issue in the past with other prescribing providers.  Patient states to avoid taking too much medication that she places 3 tablets out every morning and places in a cup. Patient states she is taking 1 and 1/2 tablet in the morning when she gets up and 1 and 1/2 tablet about 4pm.   States she plans to discuss the way she is taking her medication with her MD on her next appt 01/18/2016  ASTHMA H/o Managed by Primary MD due to Specialty co-pay $60.00 and unaffordable.  Patient prefers to use her Nebulizer with Albuterol if needed over using the prescribed Spiriva due to Spiriva has a $  60 dollar co-pay.  Recent respiratory infection and followed up with Primary MD for evaluation.  MD provided steroid injection and prescription for antibiotics.  Patient reports wheezing has improved but still very fatigued.    Elevated BP readings Patient has purchased a digital BP cuff for home.  Patient relates to pain.   BP reading this morning 130/90 following rest.  Vitals per [facility]: BP 128/72 11/29/2015 Weight 179 lb (81 kg)  11/29/2015 Height 65 in (164 cm) 11/29/2015 BMI 30.15 (Obese Class I) 12/29/2015  Osteoporosis  Exercise consist of morning stretching and treadmill 15 minutes 3-4 times a week.  Shopping/grocery trips are very taxing and increases pain. Prolia Injections: 2 injections a year. Bone Density pending.    Medications:  Prevnar (PCV13) [place] 03/08/2014 Pneumovax (PPSV2 03/19/2010 Flu Vaccine 10/18/15 tDAP Vaccine 11/19/2013 Medication reconciliation completed with patient 01/02/2016   Encounter Medications:  Outpatient Encounter Prescriptions as of 01/02/2016  Medication Sig Note  . calcium citrate-vitamin D (CALCIUM + D) 315-200 MG-UNIT per tablet Take 1 tablet by mouth daily. For bone health 09/21/2015: Taking again.   Marland Kitchen denosumab (PROLIA) 60 MG/ML SOLN injection Inject 60 mg into the skin every 6 (six) months. Administer in upper arm, thigh, or abdomen   . FLUoxetine (PROZAC) 20 MG capsule Take 20 mg by mouth daily.   Marland Kitchen FLUoxetine (PROZAC) 40 MG capsule Take 40 mg by mouth daily. 11/22/2015: Received from: [place]: Take 40 mg by mouth.  . Fluticasone Propionate, Inhal, (FLOVENT DISKUS) 100 MCG/BLIST AEPB Inhale into the lungs.   . gabapentin (NEURONTIN) 300 MG capsule Take 1 capsule (300 mg total) by mouth 2 (two) times daily. For agitation/pain management 06/27/2015: Patient reported on 06/27/2015 she is taking 600 mg 3 times a day if needed but states only taking twice a day.   . levothyroxine (SYNTHROID) 125 MCG tablet Take 1 tablet (125 mcg total) by mouth daily before breakfast. For low functioning thyroid   . methocarbamol (ROBAXIN) 500 MG tablet Take 500 mg by mouth. 09/21/2015: States taking three times a day.   . Multiple Vitamin (MULTIVITAMIN) tablet Take 1 tablet by mouth daily. For low vitamin 09/21/2015: Patient taking gummies  . naproxen sodium (ANAPROX) 220 MG tablet Take 220 mg by mouth 2 (two) times daily with a meal. Reported on 06/27/2015 09/21/2015: Taking every  night.   . ondansetron (ZOFRAN) 8 MG tablet Take 8 mg by mouth every 8 (eight) hours as needed for nausea or vomiting (Migraine Head Ache symptoms). 08/03/2015: Taking as needed for migraine headaches.   Marland Kitchen oxcarbazepine (TRILEPTAL) 600 MG tablet Take 600 mg by mouth 2 (two) times daily.    . Oxycodone HCl 10 MG TABS Take 10 mg by mouth every 8 (eight) hours.  11/22/2015: Received from: External Pharmacy  . QUEtiapine (SEROQUEL) 50 MG tablet Take 1 tablet by mouth daily. 08/03/2015: Patient takint 100 mg tablets 1-2 tabs at night   . ranitidine (ZANTAC) 300 MG tablet Take 300 mg by mouth at bedtime. 11/19/2014: Received from: External Pharmacy  . rizatriptan (MAXALT-MLT) 10 MG disintegrating tablet as needed. 11/19/2014: Received from: External Pharmacy Received Sig: TAKE 1 TABLET BY MOUTH AS NEEDED MIGRAINE  . valACYclovir (VALTREX) 500 MG tablet Take 500 mg by mouth 2 (two) times daily. 09/21/2015: Takes as needed only.    No facility-administered encounter medications on file as of 01/02/2016.     Functional Status:  In your present state of health, do you have any difficulty performing the following activities: 08/23/2015 06/28/2015  Hearing? N N  Vision? N N  Difficulty concentrating or making decisions? N N  Walking or climbing stairs? Y Y  Dressing or bathing? N N  Doing errands, shopping? Y N  Preparing Food and eating ? N N  Using the  Toilet? N N  In the past six months, have you accidently leaked urine? N N  Do you have problems with loss of bowel control? N N  Managing your Medications? N N  Managing your Finances? N N  Housekeeping or managing your Housekeeping? Y N  Some recent data might be hidden    Fall/Depression Screening: PHQ 2/9 Scores 01/02/2016 10/20/2015 09/21/2015 08/23/2015 05/27/2015 05/09/2015  PHQ - 2 Score 0 2 0 0 0 0  PHQ- 9 Score - 4 - - - -   Fall Risk  01/02/2016 11/24/2015 10/20/2015 09/21/2015 08/23/2015  Falls in the past year? Yes Yes Yes Yes Yes  Number falls in past yr:  1 1 1 1 1   Injury with Fall? Yes Yes Yes Yes Yes  Risk Factor Category  High Fall Risk High Fall Risk High Fall Risk High Fall Risk -  Risk for fall due to : History of fall(s);Impaired balance/gait;Impaired mobility;Medication side effect;Other (Comment) History of fall(s);Impaired balance/gait;Impaired mobility;Other (Comment) - History of fall(s);Impaired balance/gait;Impaired mobility;Other (Comment) History of fall(s);Impaired mobility;Medication side effect;Other (Comment);Impaired balance/gait  Risk for fall due to (comments): chronic pain chronic pain  - pain osteoporosis and pain   Follow up Falls evaluation completed;Education provided;Falls prevention discussed Falls evaluation completed;Education provided;Falls prevention discussed Falls evaluation completed;Falls prevention discussed Falls evaluation completed;Education provided;Falls prevention discussed Falls evaluation completed;Education provided;Falls prevention discussed    Assessment / Plan:  Referral Date: 05/04/2015 Screening: 05/09/2015 Telephonic RN CM start date: 05/09/2015 Program: Other: Pain management: 05/09/2015 Initial Assessment: 05/27/2015 MD Quarterly update: 08/23/2015, 11/23/15  Pain Management: Patient remains adherent to engaging with [facility].  Patient seeking pain management through correct channels. RN CM will continue to assess patient's use of medications, adherence, and taking as MD prescribes. RN CM provided verbal education on importance of taking medication as ordered (Oxycodone 10 mg - 1 tablet every 8 hours.   RN CM advised not to self medicate or self dose by adding and taking away 1/2 tablet.   RN CM provided verbal education on importance of maintaining pain management level in the bloodstream at all times and this can not happen with the peaks and valleys of self dosing.   RN CM advised that MD will expect patient to take as ordered and not to expect MD to provide services if  patient becomes unaccountable to directions, trust and management.   RN CM reminded of difficulty finding MD to treat pain due to past non-adherence and self dosing issues.   Patient verbalized understanding and states she will report how she is taking her medication on her next MD appt. .   Elevated BP RN CM advised to monitor BP, log readings and take tracker to all MD appt's.  Emmi Education mailed 10/20/2015 (mail returned) / mailed again 11/24/15. (Reviewed 01/02/16) - Hypertension -High Blood Pressure (Hypertension): Taking Your Blood Pressure -Heart Failure: About Rest, Exercise, and Blood Pressure -Low Salt Diet  Osteoporosis: RN CM provided education on importance of adherence to scheduling and completing Bone Density testing and importance of having results to assist with authorization needs for continuation of Prolia Injections.   Fall prevention discussed.  Calcium fortified diet encouraged.  Patient to call Primary for MD appt to schedule labs needed for Prolia management.   Advanced Directives: RN CM will continue to encourage completion of document.    EMMI Education materials (mailed 01/02/2016) -High Blood Pressure:  Health Problems -About High Blood Pressure -High Blood Pressure:  What You Can Do -High  Blood Pressure:  Taking Your Blood Pressure -Keeping An Eye On Your Blood Pressure -Low-Salt Diet  -Weight and BP tracking forms   RN CM scheduled next contact call within the next 30 days for Telephonic Monthly Assessment and Care Coordination services as needed. . RN CM advised to please notify MD of any changes in condition prior to scheduled appt's.  RN CM provided contact name and # [phone] or main office # [phone] and 24-hour nurse line # [phone].  RN CM confirmed patient is aware of 911 services for urgent emergency needs  [name], [place], BSN, RN, [facility] Management Care Management  Coordinator [phone] Direct [phone] Cell [phone] Office [phone] Fax [email or web address]

## 2016-01-09 ENCOUNTER — Ambulatory Visit (INDEPENDENT_AMBULATORY_CARE_PROVIDER_SITE_OTHER): Payer: Commercial Managed Care - HMO | Admitting: *Deleted

## 2016-01-09 ENCOUNTER — Ambulatory Visit (INDEPENDENT_AMBULATORY_CARE_PROVIDER_SITE_OTHER): Payer: Commercial Managed Care - HMO

## 2016-01-09 DIAGNOSIS — M810 Age-related osteoporosis without current pathological fracture: Secondary | ICD-10-CM

## 2016-01-09 MED ORDER — DENOSUMAB 60 MG/ML ~~LOC~~ SOLN
60.0000 mg | Freq: Once | SUBCUTANEOUS | Status: AC
  Administered 2016-01-09: 60 mg via SUBCUTANEOUS

## 2016-01-09 NOTE — Telephone Encounter (Signed)
Prolia given today  Next due 07/10/16  Calcium level PCP 11/29/15  10.0. [employer] PA approval [id number]  Ref #  Valid 11/30/15  Thru 11/28/16

## 2016-01-10 ENCOUNTER — Encounter: Payer: Self-pay | Admitting: Gynecology

## 2016-01-17 ENCOUNTER — Other Ambulatory Visit: Payer: Self-pay | Admitting: Women's Health

## 2016-01-18 ENCOUNTER — Telehealth: Payer: Self-pay | Admitting: *Deleted

## 2016-01-18 NOTE — Telephone Encounter (Signed)
Acyclovir ointment 5 % approved via cover my meds PA Case [id number]

## 2016-01-18 NOTE — Telephone Encounter (Signed)
Last filled in 2016

## 2016-01-19 ENCOUNTER — Ambulatory Visit: Payer: Self-pay

## 2016-01-19 DIAGNOSIS — G894 Chronic pain syndrome: Secondary | ICD-10-CM | POA: Diagnosis not present

## 2016-01-19 DIAGNOSIS — M4316 Spondylolisthesis, lumbar region: Secondary | ICD-10-CM | POA: Diagnosis not present

## 2016-01-24 ENCOUNTER — Ambulatory Visit: Payer: Commercial Managed Care - HMO

## 2016-01-30 ENCOUNTER — Ambulatory Visit: Payer: Self-pay

## 2016-01-30 ENCOUNTER — Other Ambulatory Visit: Payer: Self-pay

## 2016-01-30 NOTE — Patient Outreach (Signed)
[facility]) Care Management  01/30/2016  [name] 1951-02-26 [id number]  Telephonic Monthly Assessment Program: Other: Pain Management 05/09/15 Insurance: [name] Plus Medicare Advantage  Subjective:  Patient states pain is improving    Providers: Primary MD: Dr. Ivin Booty Wolters-12/30/15 - next appt scheduled for  Jan. 2018.  Gynecologist: [name], NP - last appt 11/2015 Behavior Health services activewith [facility]: [name]  Pain MD:  Dr. Rochele Pages, [facility], [place] - last appt 12/2015 - next appt 03/2016 Optometrist: Maryfrances Bunnell: Last appt (2015).  Dentist: Dr. Nicki Reaper Minor (2015)  Social: Patient prefers to be called "Victoria Levine." Patient reached at [phone] H/o Divorced and lives alone for about 10 years. Patient is still able to run errands to manage details of her life but not without pain and planning / spacing her trips out over the week. Last fall date: 05/09/15. H/o Depression, Bipolar, Anxiety:Past H/o 27 years of being active with AA services and had a sponsor/[name] but sponsor stopped providing support on previous Psych Admission when sponsor discovered that patient had been abusing Oxycodone. H/o no longer attends AA but does attend NA now. Transportation: yes - self Caregiver: None. H/o a daughter with 3 children 7, 5 and 1 married to [name] living in [place] and a brother in [place] but does not call on them for any assistance.   Support System: friends, neighbors. Advance Directives: None.  Patient has copy of Advance Directive but has not completed.  H/o most likely would choose her brother who lives in [place] but has not discussed with him yet.Patient confirms she has the document saved and still plans to address in the future.  Resources; -SS: $1,953.00 a month + pension $900.00.  -H/o College loan forgiven due to poverty level per their assessment.  DME: [name],  back brace,Tens Unit, Nebulizer, Glasses: prescription and reading glasses, scales, BP cuff (digital).  Co-morbidities: Hypothyroidism, GERD, Hypercholesterolemia, Hiatal Hernia, Depression, Bipolar, Anxiety, Chronic Obstructive Asthma, Chronic pain. Osteoporosis, Spondylolisthesis of lumbar region, Herpes.   Chronic Pain -H/o past Opioid abuse with opioid-induced mood disorder (2016) -H/o past Benzodiazepine dependence (2016) -Osteoporosis with back compression fractures on Prolia since 2013. -Spondylolisthesis of lumbar region -H/o Back Surgery 02/05/2010 -H/o a fall when she fell with a leaf blower and rolled 3 times in 2011. Last fall date: 05/09/15. Chronic pain managed with Oxycodone 10 mg every eight hours; gabapentin and Methocarbamol (Robaxin).  States MD provided 3 monthly  Prescriptions on last appt and next appt scheduled for Jan. 2018.   ASTHMA / Bronchitis H/o recent MD visit to manage Bronchitis. H/o Managed by Primary MD due to Specialty co-pay $60.00 and unaffordable.   Patient prefers to use her Nebulizer with Albuterol if needed over using the prescribed Spiriva due to Spiriva has a $60 dollar co-pay.  BP 130/90 01/02/2016   Weight 179 lb (81 kg) 01/02/2016  - patient updates she has lost weight due to her ability to be more active since her pain is under better control.  States home weight of 173 lbs.  Height 65 in (165 cm) 01/02/2016 BMI 29.80 (Overweight, Pre-obe 01/02/2016  Osteoporosis  Exercise consist of morning stretching and treadmill 15 minutes 3-4 times a week.  Paces all activities.  Prolia Injections: 2 injections a year. Lab work 12/2015 Bone Density completed 01/09/16   Medications:  Prevnar (PCV13) [place] 03/08/2014 Pneumovax (PPSV2 03/19/2010 Flu Vaccine 10/18/15 tDAP Vaccine 11/19/2013 Medication reconciliation completed with patient 01/30/2016  Encounter Medications:  Outpatient  Encounter Prescriptions as of 01/30/2016  Medication Sig  Note  . acyclovir ointment (ZOVIRAX) 5 % APPLY TO AFFECTED AREA EVERY 3 HOURS   . calcium citrate-vitamin D (CALCIUM + D) 315-200 MG-UNIT per tablet Take 1 tablet by mouth daily. For bone health 09/21/2015: Taking again.   Marland Kitchen denosumab (PROLIA) 60 MG/ML SOLN injection Inject 60 mg into the skin every 6 (six) months. Administer in upper arm, thigh, or abdomen   . FLUoxetine (PROZAC) 20 MG capsule Take 20 mg by mouth daily.   Marland Kitchen FLUoxetine (PROZAC) 40 MG capsule Take 40 mg by mouth daily. 11/22/2015: Received from: [place]: Take 40 mg by mouth.  . Fluticasone Propionate, Inhal, (FLOVENT DISKUS) 100 MCG/BLIST AEPB Inhale into the lungs.   . gabapentin (NEURONTIN) 300 MG capsule Take 1 capsule (300 mg total) by mouth 2 (two) times daily. For agitation/pain management 06/27/2015: Patient reported on 06/27/2015 she is taking 600 mg 3 times a day if needed but states only taking twice a day.   . levothyroxine (SYNTHROID) 125 MCG tablet Take 1 tablet (125 mcg total) by mouth daily before breakfast. For low functioning thyroid   . methocarbamol (ROBAXIN) 500 MG tablet Take 500 mg by mouth. 09/21/2015: States taking three times a day.   . Multiple Vitamin (MULTIVITAMIN) tablet Take 1 tablet by mouth daily. For low vitamin 09/21/2015: Patient taking gummies  . naproxen sodium (ANAPROX) 220 MG tablet Take 220 mg by mouth 2 (two) times daily with a meal. Reported on 06/27/2015 09/21/2015: Taking every night.   . ondansetron (ZOFRAN) 8 MG tablet Take 8 mg by mouth every 8 (eight) hours as needed for nausea or vomiting (Migraine Head Ache symptoms). 08/03/2015: Taking as needed for migraine headaches.   Marland Kitchen oxcarbazepine (TRILEPTAL) 600 MG tablet Take 600 mg by mouth 2 (two) times daily.    . Oxycodone HCl 10 MG TABS Take 10 mg by mouth every 8 (eight) hours.  11/22/2015: Received from: External Pharmacy  . QUEtiapine (SEROQUEL) 50 MG tablet Take 1 tablet by mouth daily. 08/03/2015: Patient takint 100 mg tablets 1-2  tabs at night   . ranitidine (ZANTAC) 300 MG tablet Take 300 mg by mouth at bedtime. 11/19/2014: Received from: External Pharmacy  . rizatriptan (MAXALT-MLT) 10 MG disintegrating tablet as needed. 11/19/2014: Received from: External Pharmacy Received Sig: TAKE 1 TABLET BY MOUTH AS NEEDED MIGRAINE  . valACYclovir (VALTREX) 500 MG tablet Take 500 mg by mouth 2 (two) times daily. 09/21/2015: Takes as needed only.   . valACYclovir (VALTREX) 500 MG tablet TAKE 1 TABLET DAILY FOR SUPPRESSION OR TWICE A DAY FOR 3-5 DAYS AS NEEDED    No facility-administered encounter medications on file as of 01/30/2016.     Functional Status:  In your present state of health, do you have any difficulty performing the following activities: 08/23/2015 06/28/2015  Hearing? N N  Vision? N N  Difficulty concentrating or making decisions? N N  Walking or climbing stairs? Y Y  Dressing or bathing? N N  Doing errands, shopping? Y N  Preparing Food and eating ? N N  Using the Toilet? N N  In the past six months, have you accidently leaked urine? N N  Do you have problems with loss of bowel control? N N  Managing your Medications? N N  Managing your Finances? N N  Housekeeping or managing your Housekeeping? Y N  Some recent data might be hidden    Fall/Depression Screening: PHQ 2/9 Scores 01/30/2016  01/02/2016 10/20/2015 09/21/2015 08/23/2015 05/27/2015 05/09/2015  PHQ - 2 Score 0 0 2 0 0 0 0  PHQ- 9 Score - - 4 - - - -   Fall Risk  01/30/2016 01/02/2016 11/24/2015 10/20/2015 09/21/2015  Falls in the past year? Yes Yes Yes Yes Yes  Number falls in past yr: 1 1 1 1 1   Injury with Fall? Yes Yes Yes Yes Yes  Risk Factor Category  High Fall Risk High Fall Risk High Fall Risk High Fall Risk High Fall Risk  Risk for fall due to : History of fall(s);Impaired balance/gait;Impaired mobility;Medication side effect History of fall(s);Impaired balance/gait;Impaired mobility;Medication side effect;Other (Comment) History of fall(s);Impaired  balance/gait;Impaired mobility;Other (Comment) - History of fall(s);Impaired balance/gait;Impaired mobility;Other (Comment)  Risk for fall due to (comments): - chronic pain chronic pain  - pain  Follow up Falls evaluation completed;Education provided;Falls prevention discussed Falls evaluation completed;Education provided;Falls prevention discussed Falls evaluation completed;Education provided;Falls prevention discussed Falls evaluation completed;Falls prevention discussed Falls evaluation completed;Education provided;Falls prevention discussed    Assessment / Plan:  Referral Date: 05/04/2015 Screening: 05/09/2015 Telephonic RN CM start date: 05/09/2015 Program: Other: Pain management: 05/09/2015 Initial Assessment: 05/27/2015 MD Quarterly update: 08/23/2015, 11/23/15  Pain Management: RN CM will monitor adherence to taking pain medications as directed over the next month and not running out of medication from non-adherence.   Osteoporosis:  Fall prevention discussed.  Calcium fortified diet encouraged.   Advanced Directives: RN CM will continue to encourage completion of document.    EMMI Education materials (mailed 01/02/2016 - reviewed 01/30/2016) -High Blood Pressure:  Health Problems -About High Blood Pressure -High Blood Pressure:  What You Can Do -High Blood Pressure:  Taking Your Blood Pressure -Keeping An Eye On Your Blood Pressure -Low-Salt Diet  -Weight and BP tracking forms  Emmi Education mailed 10/20/2015 (mail returned) / mailed again 11/24/15. (Reviewed 01/02/16) - Hypertension -High Blood Pressure (Hypertension): Taking Your Blood Pressure -Heart Failure: About Rest, Exercise, and Blood Pressure -Low Salt Diet  RN CM scheduled next contact call within the next 30 days for Telephonic Monthly Assessment and Care Coordination services as needed. . RN CM advised to please notify MD of any changes in condition prior to scheduled appt's.  RN CM provided contact  name and # [phone] or main office # [phone] and 24-hour nurse line # [phone].  RN CM confirmed patient is aware of 911 services for urgent emergency needs  [name], [place], BSN, RN, [place] Management Care Management Coordinator [phone] Direct [phone] Cell [phone] Office [phone] Fax [email or web address]

## 2016-02-14 DIAGNOSIS — F319 Bipolar disorder, unspecified: Secondary | ICD-10-CM | POA: Diagnosis not present

## 2016-02-14 DIAGNOSIS — F4310 Post-traumatic stress disorder, unspecified: Secondary | ICD-10-CM | POA: Diagnosis not present

## 2016-02-14 DIAGNOSIS — F441 Dissociative fugue: Secondary | ICD-10-CM | POA: Diagnosis not present

## 2016-02-23 ENCOUNTER — Ambulatory Visit: Payer: Self-pay

## 2016-02-27 ENCOUNTER — Ambulatory Visit: Payer: Self-pay

## 2016-02-29 ENCOUNTER — Ambulatory Visit: Payer: Self-pay

## 2016-03-01 ENCOUNTER — Ambulatory Visit: Payer: Self-pay

## 2016-03-02 ENCOUNTER — Ambulatory Visit: Payer: Self-pay

## 2016-03-05 ENCOUNTER — Ambulatory Visit: Payer: Self-pay

## 2016-03-06 ENCOUNTER — Ambulatory Visit: Payer: Self-pay

## 2016-03-07 ENCOUNTER — Ambulatory Visit: Payer: Self-pay

## 2016-03-08 ENCOUNTER — Ambulatory Visit: Payer: Self-pay

## 2016-03-13 ENCOUNTER — Ambulatory Visit: Payer: Self-pay

## 2016-03-14 ENCOUNTER — Ambulatory Visit: Payer: Self-pay

## 2016-03-15 ENCOUNTER — Ambulatory Visit: Payer: Self-pay

## 2016-03-20 ENCOUNTER — Ambulatory Visit: Payer: Self-pay

## 2016-03-21 ENCOUNTER — Other Ambulatory Visit: Payer: Self-pay

## 2016-03-21 NOTE — Patient Outreach (Signed)
[facility]) Care Management  03/21/2016  GRETCHYN BRACKINS 1950-09-06 [id number]   Telephonic Monthly Assessment  Outreach call #1 to patient.  Patient not reached.  RN CM left HIPAA compliant voice message with name and number for call back.  RN CM scheduled for next outreach call within one week.    Nathaneil Canary, BSN, RN, [place] Care Management Care Management Coordinator [phone] Direct [phone] Cell [phone] Office [phone] Fax [email or web address]

## 2016-03-22 ENCOUNTER — Ambulatory Visit: Payer: Self-pay

## 2016-03-22 ENCOUNTER — Other Ambulatory Visit: Payer: Self-pay

## 2016-03-22 NOTE — Patient Outreach (Signed)
[facility] Care Management  03/22/2016  NETHRA MEHLBERG Aug 06, 1950 [id number]   Telephonic Monthly Assessment Program: Other: Pain Management 05/09/15 Insurance: [name] Plus Medicare Advantage  Subjective:  Patient reached; call completed.  [place] [id number] [phone] (H)  Providers: Primary MD: Dr. Ivin Booty Wolters-12/30/15   Annual appt 03/2016 Gynecologist: [name], NP - last appt 11/2015 [facility] Health services [facility]: [name]  Seeing every 3 months.  Pain MD:  Dr. Rochele Pages, [facility], [place] - seeing every 2-3 months. Next appt 04/03/16 Optometrist: Maryfrances Bunnell: Last appt (2015).  Dentist: Dr. Nicki Reaper Minor (2015)  Social: Patient prefers to be called "[name]." Patient reached at [phone] H/o Divorced and lives alone for about 10 years. Patient is still able to run errands to manage details of her life but not without pain and planning / spacing her trips out over the week. Last fall date: 05/09/15.  Bipolar, Anxiety:  H/o 27 years of being active with AA services and had a sponsor/[name] but sponsor stopped providing support on previous Psych Admission when sponsor discovered that patient had been abusing Oxycodone. H/o no longer attends AA but does attend NA. Transportation: yes - self Caregiver: None. H/o a daughter with 3 children 7, 5 and 1 married to [name] and a brother in [place] but does not call on them for any assistance.   Support System: friends, neighbors. Advanced Directives: None. H/o most likely would choose her brother who lives in [place] but has not discussed with him yet.Patient confirms she has the document saved and still plans to address in the future.  Resources; -SS: $1,953.00 a month + pension $900.00.  -H/o College loan forgiven due to poverty level per their assessment.  DME: [name],  back brace,Tens Unit, Nebulizer, Glasses: prescription and reading glasses, scales, BP cuff  Co-morbidities: Hypothyroidism, GERD, Hypercholesterolemia, Hiatal Hernia, Depression, Bipolar, Anxiety, Chronic Obstructive Asthma, Chronic pain. Osteoporosis, Spondylolisthesis of lumbar region, Herpes.   Chronic Pain -H/o past Opioid abuse with opioid-induced mood disorder (2016) -H/o past Benzodiazepine dependence (2016) -Osteoporosis with back compression fractures on Prolia since 2013. -Spondylolisthesis of lumbar region -H/o Back Surgery 02/05/2010 -H/o a fall when she fell with a leaf blower and rolled 3 times in 2011.  Chronic pain managed with Oxycodone, gabapentin and Methocarbamol (Robaxin).  Patient reports improvement in pain management and remains active with Pain Management services with Pain MD:  Dr. Rochele Pages, [facility], [place] - seeing every 2-3 months.  .    ASTHMA H/o Managed by Primary MD due to Specialty co-pay $60.00 and unaffordable.  Patient prefers to use her Nebulizer with Albuterol if needed over using the prescribed Spiriva due to Spiriva has a $  60 dollar co-pay.  Nebulizer use only as needed.    BP 130/90 01/02/2016 Weight 179 lb (81 kg) 01/02/2016 Height 65 in (165 cm) 01/02/2016 BMI 29.80 (Overweight, Pre-obe 01/02/2016  Lipid Panel completed 11/19/2013 HDL 42.000 11/19/2013 LDL 134.000 11/19/2013 Cholesterol, total 209.000 09/26/2012 Triglycerides 173.000 11/19/2013 A1C N/D Glucose Random 159.000 11/29/2015  Osteoporosis  Exercise consist of morning stretching and treadmill 15 minutes 3-4 times a week.  Shopping/grocery trips are very taxing and increases pain. Prolia Injections: 2 injections a year. Bone Density 01/2016  Medications:  Prevnar (PCV13) 03/08/2014 Pneumovax (PPS 03/19/2010 Flu Vaccine 11/29/2015 tDAP Vaccine 11/19/2013  Encounter Medications:  Outpatient Encounter Prescriptions as of 03/22/2016  Medication Sig Note  . acyclovir  ointment (ZOVIRAX) 5 %  APPLY TO AFFECTED AREA EVERY 3 HOURS   . calcium citrate-vitamin D (CALCIUM + D) 315-200 MG-UNIT per tablet Take 1 tablet by mouth daily. For bone health 09/21/2015: Taking again.   Marland Kitchen denosumab (PROLIA) 60 MG/ML SOLN injection Inject 60 mg into the skin every 6 (six) months. Administer in upper arm, thigh, or abdomen   . FLUoxetine (PROZAC) 20 MG capsule Take 20 mg by mouth daily.   Marland Kitchen FLUoxetine (PROZAC) 40 MG capsule Take 40 mg by mouth daily. 11/22/2015: Received from: [place]: Take 40 mg by mouth.  . Fluticasone Propionate, Inhal, (FLOVENT DISKUS) 100 MCG/BLIST AEPB Inhale into the lungs.   . gabapentin (NEURONTIN) 300 MG capsule Take 1 capsule (300 mg total) by mouth 2 (two) times daily. For agitation/pain management 06/27/2015: Patient reported on 06/27/2015 she is taking 600 mg 3 times a day if needed but states only taking twice a day.   . levothyroxine (SYNTHROID) 125 MCG tablet Take 1 tablet (125 mcg total) by mouth daily before breakfast. For low functioning thyroid   . methocarbamol (ROBAXIN) 500 MG tablet Take 500 mg by mouth. 09/21/2015: States taking three times a day.   . Multiple Vitamin (MULTIVITAMIN) tablet Take 1 tablet by mouth daily. For low vitamin 09/21/2015: Patient taking gummies  . naproxen sodium (ANAPROX) 220 MG tablet Take 220 mg by mouth 2 (two) times daily with a meal. Reported on 06/27/2015 09/21/2015: Taking every night.   . ondansetron (ZOFRAN) 8 MG tablet Take 8 mg by mouth every 8 (eight) hours as needed for nausea or vomiting (Migraine Head Ache symptoms). 08/03/2015: Taking as needed for migraine headaches.   Marland Kitchen oxcarbazepine (TRILEPTAL) 600 MG tablet Take 600 mg by mouth 2 (two) times daily.    . Oxycodone HCl 10 MG TABS Take 10 mg by mouth every 8 (eight) hours.  11/22/2015: Received from: External Pharmacy  . QUEtiapine (SEROQUEL) 50 MG tablet Take 1 tablet by mouth daily. 08/03/2015: Patient takint 100 mg tablets 1-2 tabs at night   .  ranitidine (ZANTAC) 300 MG tablet Take 300 mg by mouth at bedtime. 11/19/2014: Received from: External Pharmacy  . rizatriptan (MAXALT-MLT) 10 MG disintegrating tablet as needed. 11/19/2014: Received from: External Pharmacy Received Sig: TAKE 1 TABLET BY MOUTH AS NEEDED MIGRAINE  . valACYclovir (VALTREX) 500 MG tablet Take 500 mg by mouth 2 (two) times daily. 09/21/2015: Takes as needed only.   . valACYclovir (VALTREX) 500 MG tablet TAKE 1 TABLET DAILY FOR SUPPRESSION OR TWICE A DAY FOR 3-5 DAYS AS NEEDED    No facility-administered encounter medications on file as of 03/22/2016.     Functional Status:  In your present state of health, do you have any difficulty performing the following activities: 03/22/2016 08/23/2015  Hearing? N N  Vision? N N  Difficulty concentrating or making decisions? N N  Walking or climbing stairs? N Y  Dressing or bathing? N N  Doing errands, shopping? N Y  Conservation officer, nature and eating ? N N  Using the Toilet? N N  In the past six months, have you accidently leaked urine? N N  Do you have problems with loss of bowel control? N N  Managing your Medications? N N  Managing your Finances? N N  Housekeeping or managing your Housekeeping? N Y  Some recent data might be hidden    Fall/Depression Screening: PHQ 2/9 Scores 03/22/2016 01/30/2016 01/02/2016 10/20/2015 09/21/2015 08/23/2015 05/27/2015  PHQ - 2 Score 0 0 0 2 0 0  0  PHQ- 9 Score - - - 4 - - -   Last Fall Date:  04/2015.  Fall Risk  03/22/2016 01/30/2016 01/02/2016 11/24/2015 10/20/2015  Falls in the past year? Yes Yes Yes Yes Yes  Number falls in past yr: '1 1 1 1 1  ' Injury with Fall? Yes Yes Yes Yes Yes  Risk Factor Category  High Fall Risk High Fall Risk High Fall Risk High Fall Risk High Fall Risk  Risk for fall due to : History of fall(s);Impaired mobility;Medication side effect History of fall(s);Impaired balance/gait;Impaired mobility;Medication side effect History of fall(s);Impaired balance/gait;Impaired  mobility;Medication side effect;Other (Comment) History of fall(s);Impaired balance/gait;Impaired mobility;Other (Comment) -  Risk for fall due to (comments): - - chronic pain chronic pain  -  Follow up Falls evaluation completed;Education provided;Falls prevention discussed Falls evaluation completed;Education provided;Falls prevention discussed Falls evaluation completed;Education provided;Falls prevention discussed Falls evaluation completed;Education provided;Falls prevention discussed Falls evaluation completed;Falls prevention discussed    Assessment / Plan:  Referral Date: 05/04/2015 Screening: 05/09/2015 Telephonic RN CM start date: 05/09/2015 - 03/22/2016 Program: Other: Pain management: 05/09/2015 - 03/22/2016 Initial Assessment: 05/27/2015 MD Quarterly update: 08/23/2015, 11/23/15  [facility] Education mailed 10/20/2015 (mail returned) / mailed again 11/24/15. (Reviewed 01/02/16) - Hypertension -High Blood Pressure (Hypertension): Taking Your Blood Pressure -Heart Failure: About Rest, Exercise, and Blood Pressure -Low Salt Diet EMMI Education materials (mailed 01/02/2016 - reviewed 03/22/2016) -High Blood Pressure:  Health Problems -About High Blood Pressure -High Blood Pressure:  What You Can Do -High Blood Pressure:  Taking Your Blood Pressure -Keeping An Eye On Your Blood Pressure -Low-Salt Diet  -Weight and BP tracking forms   RN CM advised goals of care met; case closure.  RN CM advised to please notify MD of any changes in condition prior to scheduled appt's.  RN CM provided contact name and # [phone] or main office # [phone] and 24-hour nurse line # [phone].  RN CM confirmed patient is aware of 911 services for urgent emergency needs  [facility] notified case closure MD notified via letter.  Case closure letter to patient.    THN CM Care Plan Problem One   Flowsheet Row Most Recent Value  Care Plan Problem One  Pain Medication Adherence  Role Documenting the  Problem One  Care Management Telephonic Coordinator  Care Plan for Problem One  Active  THN Long Term Goal (31-90 days)  Patient will take pain medications as directed over the next 31-90 days.   THN Long Term Goal Start Date  01/30/16  THN Long Term Goal Met Date  03/22/16  Interventions for Problem One Long Term Goal  RN CM will monitor patient's adherence over the next 31-90 days.   THN CM Short Term Goal #1 (0-30 days)  Patient will take medications as ordered over the next 30 days.   THN CM Short Term Goal #1 Start Date  11/24/15  [facility] CM Short Term Goal #1 Met Date  01/30/16  Interventions for Short Term Goal #1  RN CM will monitor for patients ability to take medications as ordered and avoidance of self medicating over the next 30 days.     [facility] CM Care Plan Problem Two   Flowsheet Row Most Recent Value  Care Plan Problem Two  Advanced Directive:  none  Role Documenting the Problem Two  Care Management Telephonic Coordinator  Care Plan for Problem Two  Active  THN CM Short Term Goal #1 (0-30 days)  Patient will consult with brother once recovered from  stroke regarding Advance Directive over the next 30 days.   THN CM Short Term Goal #1 Start Date  01/30/16 Barrie Folk continued: brother recovering from stroke. ]  THN CM Short Term Goal #1 Met Date   03/22/16  Interventions for Short Term Goal #2   RN CM will provide assistance  and education relating to completion of Advance Directive over the next 30 days.       Nathaneil Canary, BSN, RN, [place] Management Care Management Coordinator [phone] Direct [phone] Cell [phone] Office [phone] Fax [email or web address]

## 2016-04-03 DIAGNOSIS — M4807 Spinal stenosis, lumbosacral region: Secondary | ICD-10-CM | POA: Diagnosis not present

## 2016-04-03 DIAGNOSIS — G894 Chronic pain syndrome: Secondary | ICD-10-CM | POA: Diagnosis not present

## 2016-04-03 DIAGNOSIS — M4316 Spondylolisthesis, lumbar region: Secondary | ICD-10-CM | POA: Diagnosis not present

## 2016-04-19 ENCOUNTER — Ambulatory Visit: Payer: Self-pay

## 2016-05-09 DIAGNOSIS — E7800 Pure hypercholesterolemia, unspecified: Secondary | ICD-10-CM | POA: Diagnosis not present

## 2016-05-09 DIAGNOSIS — E559 Vitamin D deficiency, unspecified: Secondary | ICD-10-CM | POA: Diagnosis not present

## 2016-05-09 DIAGNOSIS — Z79899 Other long term (current) drug therapy: Secondary | ICD-10-CM | POA: Diagnosis not present

## 2016-05-09 DIAGNOSIS — E039 Hypothyroidism, unspecified: Secondary | ICD-10-CM | POA: Diagnosis not present

## 2016-05-10 DIAGNOSIS — D649 Anemia, unspecified: Secondary | ICD-10-CM | POA: Diagnosis not present

## 2016-05-10 DIAGNOSIS — Z0000 Encounter for general adult medical examination without abnormal findings: Secondary | ICD-10-CM | POA: Diagnosis not present

## 2016-05-10 DIAGNOSIS — R197 Diarrhea, unspecified: Secondary | ICD-10-CM | POA: Diagnosis not present

## 2016-05-12 DIAGNOSIS — R197 Diarrhea, unspecified: Secondary | ICD-10-CM | POA: Diagnosis not present

## 2016-05-17 DIAGNOSIS — D509 Iron deficiency anemia, unspecified: Secondary | ICD-10-CM | POA: Diagnosis not present

## 2016-05-17 DIAGNOSIS — F319 Bipolar disorder, unspecified: Secondary | ICD-10-CM | POA: Diagnosis not present

## 2016-05-17 DIAGNOSIS — F441 Dissociative fugue: Secondary | ICD-10-CM | POA: Diagnosis not present

## 2016-05-17 DIAGNOSIS — F4310 Post-traumatic stress disorder, unspecified: Secondary | ICD-10-CM | POA: Diagnosis not present

## 2016-06-20 ENCOUNTER — Telehealth: Payer: Self-pay | Admitting: Gynecology

## 2016-06-26 NOTE — Telephone Encounter (Signed)
prolia due 07/10/16  Complete 11/22/15  N Young NP, Calcium 9.6  05/23/15 M81.0  Needs repeat Calcium ask PCP.

## 2016-06-27 NOTE — Telephone Encounter (Signed)
Pc to pt [employer] ?? New card [employer] provider per [facility]. Called pt asked to clarify insurance provider left VM

## 2016-07-03 NOTE — Telephone Encounter (Signed)
[employer] [id number]  No co pay, No deductible, PA required ref # [id number]  Valid 05/20/15 thru 05/19/17. Medicare allowable $1084.62  96372 $19.62  Total 20% pt $216.92  Ref number [facility] [id number]  Calcium Needed from PCP. PC to pt LM

## 2016-07-10 ENCOUNTER — Encounter (HOSPITAL_COMMUNITY): Payer: Self-pay | Admitting: Emergency Medicine

## 2016-07-10 ENCOUNTER — Emergency Department (HOSPITAL_COMMUNITY)
Admission: EM | Admit: 2016-07-10 | Discharge: 2016-07-10 | Disposition: A | Discharge status: Home / Self Care | Payer: Medicare HMO | Attending: Emergency Medicine | Admitting: Emergency Medicine

## 2016-07-10 DIAGNOSIS — F1193 Opioid use, unspecified with withdrawal: Secondary | ICD-10-CM

## 2016-07-10 DIAGNOSIS — F1123 Opioid dependence with withdrawal: Secondary | ICD-10-CM | POA: Insufficient documentation

## 2016-07-10 DIAGNOSIS — R112 Nausea with vomiting, unspecified: Secondary | ICD-10-CM | POA: Diagnosis not present

## 2016-07-10 DIAGNOSIS — E039 Hypothyroidism, unspecified: Secondary | ICD-10-CM | POA: Diagnosis not present

## 2016-07-10 DIAGNOSIS — Z79899 Other long term (current) drug therapy: Secondary | ICD-10-CM | POA: Insufficient documentation

## 2016-07-10 DIAGNOSIS — K2970 Gastritis, unspecified, without bleeding: Secondary | ICD-10-CM | POA: Diagnosis not present

## 2016-07-10 DIAGNOSIS — J45909 Unspecified asthma, uncomplicated: Secondary | ICD-10-CM | POA: Insufficient documentation

## 2016-07-10 HISTORY — DX: Dorsalgia, unspecified: M54.9

## 2016-07-10 HISTORY — DX: Other chronic pain: G89.29

## 2016-07-10 MED ORDER — DIPHENHYDRAMINE HCL 50 MG/ML IJ SOLN
25.0000 mg | Freq: Once | INTRAMUSCULAR | Status: AC
  Administered 2016-07-10: 25 mg via INTRAVENOUS
  Filled 2016-07-10: qty 1

## 2016-07-10 MED ORDER — METOCLOPRAMIDE HCL 5 MG/ML IJ SOLN
10.0000 mg | Freq: Once | INTRAMUSCULAR | Status: AC
  Administered 2016-07-10: 10 mg via INTRAVENOUS
  Filled 2016-07-10: qty 2

## 2016-07-10 MED ORDER — SODIUM CHLORIDE 0.9 % IV SOLN
1000.0000 mL | Freq: Once | INTRAVENOUS | Status: AC
Start: 2016-07-10 — End: 2016-07-10
  Administered 2016-07-10: 1000 mL via INTRAVENOUS

## 2016-07-10 MED ORDER — SODIUM CHLORIDE 0.9 % IV SOLN
1000.0000 mL | Freq: Once | INTRAVENOUS | Status: AC
  Administered 2016-07-10: 1000 mL via INTRAVENOUS

## 2016-07-10 MED ORDER — SODIUM CHLORIDE 0.9 % IV SOLN: 1000.0000 mL | INTRAVENOUS | Status: DC

## 2016-07-10 MED ORDER — CLONIDINE HCL 0.1 MG PO TABS
0.2000 mg | ORAL_TABLET | Freq: Once | ORAL | Status: AC
  Administered 2016-07-10: 0.2 mg via ORAL
  Filled 2016-07-10 (×2): qty 2

## 2016-07-10 MED ORDER — ONDANSETRON HCL 4 MG/2ML IJ SOLN
4.0000 mg | Freq: Once | INTRAMUSCULAR | Status: AC
  Administered 2016-07-10: 4 mg via INTRAVENOUS
  Filled 2016-07-10: qty 2

## 2016-07-10 MED ORDER — MORPHINE SULFATE (PF) 4 MG/ML IV SOLN
4.0000 mg | Freq: Once | INTRAVENOUS | Status: AC
  Administered 2016-07-10: 4 mg via INTRAVENOUS
  Filled 2016-07-10: qty 1

## 2016-07-10 NOTE — ED Notes (Signed)
Rn attempted IV access x 2 attempts both unsuccessful.

## 2016-07-10 NOTE — Discharge Instructions (Signed)
You need to fill your prescriptions.  You will need to discuss your medication dosages with the pain clinic.  The Emergency departments at [facility] can not manage your pain medication dosages.

## 2016-07-10 NOTE — ED Provider Notes (Signed)
[facility] DEPT Provider Note   CSN: [id number] Arrival date & time: 07/10/16  0488     History   Chief Complaint Chief Complaint  Patient presents with  . opiate withdrawl   Pt reports she is withdrawing from pain medication.  Pt reports her doctor stopped prescribing narcotics. Pt report she has not had medication in 3 days.  Pt complains of nausea and vomiting.  Pt is requesting detox.   Pt  records reviewed.  Pt has been on morphine 60mg .  Pt has rx to fill today for oxycodone.  Pt requested to change from morphine to oxycodone.    HPI Victoria Levine is a 66 y.o. female.  The history is provided by the patient. No language interpreter was used.    Past Medical History:  Diagnosis Date  . Asthma   . Bipolar disorder ([place])   . Chronic back pain   . DDD (degenerative disc disease)   . Depression   . Herpes   . Hiatal hernia   . Hypothyroid   . Migraines   . Osteoporosis 11/2013   T score -2.6 AP spine. 1 dose Reclast with reaction Prolia since 10/2011 most recent DEXA shows significant improvement at all measured sites.  . S/P adenoidectomy   . S/P tonsillectomy     Patient Active Problem List   Diagnosis Date Noted  . Pain 07/26/2015  . Anxiety state 06/28/2015  . Benzodiazepine dependence ([place]) 10/13/2014  . Opioid abuse with opioid-induced mood disorder ([place]) 10/13/2014  . Bipolar 1 disorder, depressed ([place]) 10/12/2014  . Spondylolisthesis of lumbar region 07/28/2014  . Osteoporosis, unspecified 11/17/2013  . Migraines   . Herpes   . HYPOTHYROIDISM 08/13/2008  . HYPERCHOLESTEROLEMIA 08/13/2008  . BIPOLAR DISORDER UNSPECIFIED 08/13/2008  . CHRONIC OBSTRUCTIVE ASTHMA UNSPECIFIED 08/13/2008  . GASTROESOPHAGEAL REFLUX DISEASE 08/13/2008  . HIATAL HERNIA 08/13/2008  . ALLERGY 08/13/2008    Past Surgical History:  Procedure Laterality Date  . CESAREAN SECTION    . Compression fracture spine    . DILATION AND CURETTAGE OF UTERUS    . EYE SURGERY  Bilateral    CATARACTS REMOVED, NO LENS PLACED  . HYSTEROSCOPY    . LUMBAR FUSION      OB History    Gravida Para Term Preterm AB Living   3 1 1   2 1    SAB TAB Ectopic Multiple Live Births                   Home Medications    Prior to Admission medications   Medication Sig Start Date End Date Taking? Authorizing Provider  acyclovir ointment (ZOVIRAX) 5 % APPLY TO AFFECTED AREA EVERY 3 HOURS 01/18/16   [name], NP  calcium citrate-vitamin D (CALCIUM + D) 315-200 MG-UNIT per tablet Take 1 tablet by mouth daily. For bone health 10/18/14   Encarnacion Slates, NP  denosumab (PROLIA) 60 MG/ML SOLN injection Inject 60 mg into the skin every 6 (six) months. Administer in upper arm, thigh, or abdomen    Historical Provider, MD  FLUoxetine (PROZAC) 20 MG capsule Take 20 mg by mouth daily.    Historical Provider, MD  FLUoxetine (PROZAC) 40 MG capsule Take 40 mg by mouth daily. 10/18/14   Historical Provider, MD  Fluticasone Propionate, Inhal, (FLOVENT DISKUS) 100 MCG/BLIST AEPB Inhale into the lungs.    Historical Provider, MD  gabapentin (NEURONTIN) 300 MG capsule Take 1 capsule (300 mg total) by mouth 2 (two) times daily. For  agitation/pain management 10/18/14   Encarnacion Slates, NP  levothyroxine (SYNTHROID) 125 MCG tablet Take 1 tablet (125 mcg total) by mouth daily before breakfast. For low functioning thyroid 10/18/14   Encarnacion Slates, NP  methocarbamol (ROBAXIN) 500 MG tablet Take 500 mg by mouth.    Historical Provider, MD  Multiple Vitamin (MULTIVITAMIN) tablet Take 1 tablet by mouth daily. For low vitamin 10/18/14   Encarnacion Slates, NP  naproxen sodium (ANAPROX) 220 MG tablet Take 220 mg by mouth 2 (two) times daily with a meal. Reported on 06/27/2015    Historical Provider, MD  ondansetron (ZOFRAN) 8 MG tablet Take 8 mg by mouth every 8 (eight) hours as needed for nausea or vomiting (Migraine Head Ache symptoms).    Historical Provider, MD  oxcarbazepine (TRILEPTAL) 600 MG tablet Take 600 mg by mouth  2 (two) times daily.     Historical Provider, MD  Oxycodone HCl 10 MG TABS Take 10 mg by mouth every 8 (eight) hours.  10/28/15   Historical Provider, MD  QUEtiapine (SEROQUEL) 50 MG tablet Take 1 tablet by mouth daily. 11/08/14   Historical Provider, MD  ranitidine (ZANTAC) 300 MG tablet Take 300 mg by mouth at bedtime. 10/26/14   Historical Provider, MD  rizatriptan (MAXALT-MLT) 10 MG disintegrating tablet as needed. 11/08/14   Historical Provider, MD  valACYclovir (VALTREX) 500 MG tablet Take 500 mg by mouth 2 (two) times daily.    Historical Provider, MD  valACYclovir (VALTREX) 500 MG tablet TAKE 1 TABLET DAILY FOR SUPPRESSION OR TWICE A DAY FOR 3-5 DAYS AS NEEDED 01/17/16   [name], NP    Family History Family History  Problem Relation Age of Onset  . Adopted: Yes    Social History Social History  Substance Use Topics  . Smoking status: Never Smoker  . Smokeless tobacco: Never Used  . Alcohol use No     Allergies   Lithium carbonate; Prednisone; and Sulfa antibiotics   Review of Systems Review of Systems  All other systems reviewed and are negative.    Physical Exam Updated Vital Signs BP (!) 159/98 (BP Location: Left Arm)   Pulse 93   Temp 98.1 F (36.7 C) (Oral)   Resp 18   Ht 5\' 5"  (1.651 m)   Wt 70.3 kg   SpO2 98%   BMI 25.79 kg/m   Physical Exam  Constitutional: She is oriented to person, place, and time. She appears well-developed and well-nourished.  HENT:  Head: Normocephalic.  Eyes: EOM are normal.  Neck: Normal range of motion.  Pulmonary/Chest: Effort normal.  Abdominal: She exhibits no distension.  Musculoskeletal: Normal range of motion.  Neurological: She is alert and oriented to person, place, and time.  Psychiatric: She has a normal mood and affect.  Nursing note and vitals reviewed.    ED Treatments / Results  Labs (all labs ordered are listed, but only abnormal results are displayed) Labs Reviewed  URINALYSIS, ROUTINE W REFLEX  MICROSCOPIC  RAPID URINE DRUG SCREEN, HOSP PERFORMED    EKG  EKG Interpretation None     Pt informed RN that she has decided she is wants to stay on pain medications and wants rx for pain medications. I discussed with pt and advised she needs to fill rx from pain management clinic.  Pt reports Rx is not for enough medication.  I advised her she needs to discuss with pain management.  Social work can give information for treatment programs if pt chooses  to stop narcotic medications.     Radiology No results found.  Procedures Procedures (including critical care time)  Medications Ordered in ED Medications  0.9 %  sodium chloride infusion (0 mLs Intravenous Stopped 07/10/16 0951)    Followed by  0.9 %  sodium chloride infusion (1,000 mLs Intravenous New Bag/Given 07/10/16 0950)    Followed by  0.9 %  sodium chloride infusion (not administered)  morphine 4 MG/ML injection 4 mg (not administered)  cloNIDine (CATAPRES) tablet 0.2 mg (0.2 mg Oral Given 07/10/16 0948)  ondansetron (ZOFRAN) injection 4 mg (4 mg Intravenous Given 07/10/16 0843)  metoCLOPramide (REGLAN) injection 10 mg (10 mg Intravenous Given 07/10/16 0904)  diphenhydrAMINE (BENADRYL) injection 25 mg (25 mg Intravenous Given 07/10/16 0904)     Initial Impression / Assessment and Plan / ED Course  I have reviewed the triage vital signs and the nursing notes.  Pertinent labs & imaging results that were available during my care of the patient were reviewed by me and considered in my medical decision making (see chart for details).       Final Clinical Impressions(s) / ED Diagnoses   Final diagnoses:  Narcotic withdrawal [facility])    New Prescriptions New Prescriptions   No medications on file  Call your Physician to be seen at pain management    Fransico Meadow, PA-C 07/10/16 [place], MD 07/12/16 [phone]

## 2016-07-10 NOTE — ED Notes (Signed)
ED Provider at bedside. 

## 2016-07-10 NOTE — ED Triage Notes (Signed)
Per EMS-states they were called out for N/V/D that started around 0500-patient states she is going thru morphine withdrawal-states she has been on med for over 3 months and her MD stopped prescribing-chronic back pain-having abdominal pain from withdrawl

## 2016-07-10 NOTE — ED Notes (Addendum)
Made Santiago Glad PA aware that Zofran not helping, patient still actively vomiting.

## 2016-07-10 NOTE — ED Notes (Signed)
Bed: TW65 Expected date:  Expected time:  Means of arrival:  Comments: EMS-morphine withdrawl

## 2016-07-10 NOTE — ED Notes (Signed)
Patient using cell phone trying to find ride home.

## 2016-07-11 ENCOUNTER — Encounter: Payer: Self-pay | Admitting: Gynecology

## 2016-07-18 DIAGNOSIS — F4310 Post-traumatic stress disorder, unspecified: Secondary | ICD-10-CM | POA: Diagnosis not present

## 2016-07-18 DIAGNOSIS — F319 Bipolar disorder, unspecified: Secondary | ICD-10-CM | POA: Diagnosis not present

## 2016-07-18 DIAGNOSIS — F441 Dissociative fugue: Secondary | ICD-10-CM | POA: Diagnosis not present

## 2016-07-24 NOTE — Telephone Encounter (Signed)
Recent ER visit . She will talk to pain management MD regarding recent admission prior to Prolia injection to get his approval for Prolia due to recent reaction to pain medication. Calcium PCP 10.3  05/10/16  Waiting for pt to call and schedule.

## 2016-07-25 DIAGNOSIS — G894 Chronic pain syndrome: Secondary | ICD-10-CM | POA: Diagnosis not present

## 2016-07-25 DIAGNOSIS — Z5181 Encounter for therapeutic drug level monitoring: Secondary | ICD-10-CM | POA: Diagnosis not present

## 2016-07-25 DIAGNOSIS — M4807 Spinal stenosis, lumbosacral region: Secondary | ICD-10-CM | POA: Diagnosis not present

## 2016-07-25 DIAGNOSIS — M4316 Spondylolisthesis, lumbar region: Secondary | ICD-10-CM | POA: Diagnosis not present

## 2016-07-27 DIAGNOSIS — E039 Hypothyroidism, unspecified: Secondary | ICD-10-CM | POA: Diagnosis not present

## 2016-07-27 DIAGNOSIS — F3131 Bipolar disorder, current episode depressed, mild: Secondary | ICD-10-CM | POA: Diagnosis not present

## 2016-07-27 DIAGNOSIS — D509 Iron deficiency anemia, unspecified: Secondary | ICD-10-CM | POA: Diagnosis not present

## 2016-08-01 ENCOUNTER — Encounter: Payer: Self-pay | Admitting: Gynecology

## 2016-08-15 ENCOUNTER — Ambulatory Visit (INDEPENDENT_AMBULATORY_CARE_PROVIDER_SITE_OTHER): Payer: Medicare HMO | Admitting: *Deleted

## 2016-08-15 DIAGNOSIS — M810 Age-related osteoporosis without current pathological fracture: Secondary | ICD-10-CM

## 2016-08-15 MED ORDER — DENOSUMAB 60 MG/ML ~~LOC~~ SOLN
60.0000 mg | Freq: Once | SUBCUTANEOUS | Status: AC
  Administered 2016-08-15: 60 mg via SUBCUTANEOUS

## 2016-08-15 NOTE — Telephone Encounter (Signed)
PC to pt MD cleared her for Prolia injection. Appointment today at 2 30 pm Nurse only.

## 2016-08-21 NOTE — Telephone Encounter (Signed)
Next injection prolia due after 02/15/17 . Received injection 08/15/16

## 2016-08-27 DIAGNOSIS — M7022 Olecranon bursitis, left elbow: Secondary | ICD-10-CM | POA: Diagnosis not present

## 2016-08-27 DIAGNOSIS — K0510 Chronic gingivitis, plaque induced: Secondary | ICD-10-CM | POA: Diagnosis not present

## 2016-09-05 DIAGNOSIS — M25522 Pain in left elbow: Secondary | ICD-10-CM | POA: Diagnosis not present

## 2016-09-20 DIAGNOSIS — M4316 Spondylolisthesis, lumbar region: Secondary | ICD-10-CM | POA: Diagnosis not present

## 2016-09-20 DIAGNOSIS — M4807 Spinal stenosis, lumbosacral region: Secondary | ICD-10-CM | POA: Diagnosis not present

## 2016-09-20 DIAGNOSIS — G894 Chronic pain syndrome: Secondary | ICD-10-CM | POA: Diagnosis not present

## 2016-09-25 DIAGNOSIS — F441 Dissociative fugue: Secondary | ICD-10-CM | POA: Diagnosis not present

## 2016-09-25 DIAGNOSIS — F4310 Post-traumatic stress disorder, unspecified: Secondary | ICD-10-CM | POA: Diagnosis not present

## 2016-09-25 DIAGNOSIS — F319 Bipolar disorder, unspecified: Secondary | ICD-10-CM | POA: Diagnosis not present

## 2016-10-01 DIAGNOSIS — E039 Hypothyroidism, unspecified: Secondary | ICD-10-CM | POA: Diagnosis not present

## 2016-10-01 DIAGNOSIS — D649 Anemia, unspecified: Secondary | ICD-10-CM | POA: Diagnosis not present

## 2016-10-01 DIAGNOSIS — Z79899 Other long term (current) drug therapy: Secondary | ICD-10-CM | POA: Diagnosis not present

## 2016-10-03 DIAGNOSIS — M25522 Pain in left elbow: Secondary | ICD-10-CM | POA: Diagnosis not present

## 2016-11-20 DIAGNOSIS — M545 Low back pain: Secondary | ICD-10-CM | POA: Diagnosis not present

## 2016-11-20 DIAGNOSIS — S3992XA Unspecified injury of lower back, initial encounter: Secondary | ICD-10-CM | POA: Diagnosis not present

## 2016-11-20 DIAGNOSIS — R635 Abnormal weight gain: Secondary | ICD-10-CM | POA: Diagnosis not present

## 2016-11-20 DIAGNOSIS — W19XXXA Unspecified fall, initial encounter: Secondary | ICD-10-CM | POA: Diagnosis not present

## 2016-11-26 ENCOUNTER — Ambulatory Visit
Admission: RE | Admit: 2016-11-26 | Discharge: 2016-11-26 | Disposition: A | Discharge status: Home / Self Care | Payer: Medicare HMO | Source: Ambulatory Visit | Attending: Family Medicine | Admitting: Family Medicine

## 2016-11-26 ENCOUNTER — Ambulatory Visit
Admission: RE | Admit: 2016-11-26 | Discharge: 2016-11-26 | Disposition: A | Discharge status: Home / Self Care | Payer: Commercial Managed Care - HMO | Source: Ambulatory Visit | Attending: Family Medicine | Admitting: Family Medicine

## 2016-11-26 ENCOUNTER — Other Ambulatory Visit: Payer: Self-pay | Admitting: Family Medicine

## 2016-11-26 DIAGNOSIS — M545 Low back pain: Secondary | ICD-10-CM

## 2016-11-26 DIAGNOSIS — S3992XA Unspecified injury of lower back, initial encounter: Secondary | ICD-10-CM | POA: Diagnosis not present

## 2016-11-26 DIAGNOSIS — S22080A Wedge compression fracture of T11-T12 vertebra, initial encounter for closed fracture: Secondary | ICD-10-CM

## 2016-11-26 DIAGNOSIS — S22080G Wedge compression fracture of T11-T12 vertebra, subsequent encounter for fracture with delayed healing: Secondary | ICD-10-CM

## 2016-11-26 DIAGNOSIS — S299XXA Unspecified injury of thorax, initial encounter: Secondary | ICD-10-CM | POA: Diagnosis not present

## 2016-11-29 DIAGNOSIS — S22080A Wedge compression fracture of T11-T12 vertebra, initial encounter for closed fracture: Secondary | ICD-10-CM | POA: Diagnosis not present

## 2016-12-10 DIAGNOSIS — M545 Low back pain: Secondary | ICD-10-CM | POA: Diagnosis not present

## 2016-12-11 ENCOUNTER — Other Ambulatory Visit: Payer: Self-pay | Admitting: Orthopedic Surgery

## 2016-12-13 NOTE — Pre-Procedure Instructions (Signed)
Victoria Levine  12/13/2016   Your procedure is scheduled on Wednesday, October 3  Report to [facility] Admitting at 12:15 PM                   Your surgery or procedure is scheduled for 3:15 PM   Call this number if you have problems the morning of surgery: [phone]   Remember:  Do not eat food or drink liquids after midnight Tuesday, October 2.  Take these medicines the morning of surgery with A SIP OF WATER: buPROPion (WELLBUTRIN XL)  FLUoxetine (PROZAC)  levothyroxine (SYNTHROID) oxcarbazepine (TRILEPTAL) OxyCODONE ER (XTAMPZA ER)   May take if needed:  gabapentin (NEURONTIN) ondansetron [facility])   May use Inhalers if needed and bring Albuterol inhaler with you.           STOP taking Aspirin , Aspirin Products (Goody Powder, Excedrin Migraine), Ibuprofen (Advil), Naproxen (Aleve), Vitamins and Herbal Products (ie Fish Oil). Special instructions:  [place]- Preparing For Surgery  Before surgery, you can play an important role. Because skin is not sterile, your skin needs to be as free of germs as possible. You can reduce the number of germs on your skin by washing with CHG (chlorahexidine gluconate) Soap before surgery.  CHG is an antiseptic cleaner which kills germs and bonds with the skin to continue killing germs even after washing.  Please do not use if you have an allergy to CHG or antibacterial soaps. If your skin becomes reddened/irritated stop using the CHG.  Do not shave (including legs and underarms) for at least 48 hours prior to first CHG shower. It is OK to shave your face.  Please follow these instructions carefully.   1. Shower the NIGHT BEFORE SURGERY and the MORNING OF SURGERY with CHG.   2. If you chose to wash your hair, wash your hair first as usual with your normal shampoo.  3. After you shampoo, rinse your hair and body thoroughly to remove the shampoo.        Wash your face and private area with the soap you use at home, then  rinse.  4. Use CHG as you would any other liquid soap. You can apply CHG directly to the skin and wash gently with a scrungie or a clean washcloth.   5. Apply the CHG Soap to your body ONLY FROM THE NECK DOWN.  Do not use on open wounds or open sores. Avoid contact with your eyes, ears, mouth and genitals (private parts). Wash genitals (private parts) with your normal soap.  6. Wash thoroughly, paying special attention to the area where your surgery will be performed.  7. Thoroughly rinse your body with warm water from the neck down.  8. DO NOT shower/wash with your normal soap after using and rinsing off the CHG Soap.  9. Pat yourself dry with a CLEAN TOWEL.   10. Wear CLEAN PAJAMAS   11. Place CLEAN SHEETS on your bed the night of your first shower and DO NOT SLEEP WITH PETS.  Day of Surgery: Shower as above  Please wear clean clothes to the hospital/surgery center.               Do not apply any deodorants/lotions, powders, cologne  Do not wear jewelry, make-up or nail polish.  Do not shave 48 hours prior to surgery.    Do not bring valuables to the hospital.  [facility] is not responsible for any belongings or valuables.  Contacts, dentures or bridgework may not be worn into surgery.  Leave your suitcase in the car.  After surgery it may be brought to your room.  For patients admitted to the hospital, discharge time will be determined by your treatment team.  Patients discharged the day of surgery will not be allowed to drive home.   Please read over the following fact sheets that you were given: Special instructions:  Coughing and Deep Breathing and Pain Booklet, Surgical Site Infection

## 2016-12-14 ENCOUNTER — Other Ambulatory Visit (HOSPITAL_COMMUNITY): Payer: Self-pay | Admitting: *Deleted

## 2016-12-14 ENCOUNTER — Encounter (HOSPITAL_COMMUNITY)
Admission: RE | Admit: 2016-12-14 | Discharge: 2016-12-14 | Disposition: A | Discharge status: Home / Self Care | Payer: Medicare HMO | Source: Ambulatory Visit | Attending: Orthopedic Surgery | Admitting: Orthopedic Surgery

## 2016-12-14 ENCOUNTER — Ambulatory Visit (HOSPITAL_COMMUNITY)
Admission: RE | Admit: 2016-12-14 | Discharge: 2016-12-14 | Disposition: A | Discharge status: Home / Self Care | Payer: Medicare HMO | Source: Ambulatory Visit | Attending: Orthopedic Surgery | Admitting: Orthopedic Surgery

## 2016-12-14 ENCOUNTER — Encounter (HOSPITAL_COMMUNITY): Payer: Self-pay

## 2016-12-14 DIAGNOSIS — M4854XA Collapsed vertebra, not elsewhere classified, thoracic region, initial encounter for fracture: Secondary | ICD-10-CM | POA: Diagnosis not present

## 2016-12-14 DIAGNOSIS — Z01812 Encounter for preprocedural laboratory examination: Secondary | ICD-10-CM | POA: Diagnosis not present

## 2016-12-14 DIAGNOSIS — M545 Low back pain: Secondary | ICD-10-CM | POA: Insufficient documentation

## 2016-12-14 DIAGNOSIS — Z01818 Encounter for other preprocedural examination: Secondary | ICD-10-CM

## 2016-12-14 DIAGNOSIS — J984 Other disorders of lung: Secondary | ICD-10-CM | POA: Insufficient documentation

## 2016-12-14 DIAGNOSIS — J9811 Atelectasis: Secondary | ICD-10-CM | POA: Diagnosis not present

## 2016-12-14 DIAGNOSIS — Z01810 Encounter for preprocedural cardiovascular examination: Secondary | ICD-10-CM | POA: Insufficient documentation

## 2016-12-14 DIAGNOSIS — K449 Diaphragmatic hernia without obstruction or gangrene: Secondary | ICD-10-CM | POA: Diagnosis not present

## 2016-12-14 DIAGNOSIS — I517 Cardiomegaly: Secondary | ICD-10-CM | POA: Diagnosis not present

## 2016-12-14 HISTORY — DX: Anemia, unspecified: D64.9

## 2016-12-14 HISTORY — DX: Pneumonia, unspecified organism: J18.9

## 2016-12-14 HISTORY — DX: Personal history of suicidal behavior: Z91.51

## 2016-12-14 HISTORY — DX: Personal history of self-harm: Z91.5

## 2016-12-14 HISTORY — DX: Alcohol abuse, in remission: F10.11

## 2016-12-14 HISTORY — DX: Gastro-esophageal reflux disease without esophagitis: K21.9

## 2016-12-14 HISTORY — DX: Dyspnea, unspecified: R06.00

## 2016-12-14 LAB — CBC WITH DIFFERENTIAL/PLATELET
Basophils Absolute: 0.1 10*3/uL (ref 0.0–0.1)
Basophils Relative: 1 %
Eosinophils Absolute: 0.9 10*3/uL — ABNORMAL HIGH (ref 0.0–0.7)
Eosinophils Relative: 10 %
HCT: 43.8 % (ref 36.0–46.0)
Hemoglobin: 14.1 g/dL (ref 12.0–15.0)
Lymphocytes Relative: 36 %
Lymphs Abs: 3.2 10*3/uL (ref 0.7–4.0)
MCH: 28.7 pg (ref 26.0–34.0)
MCHC: 32.2 g/dL (ref 30.0–36.0)
MCV: 89 fL (ref 78.0–100.0)
Monocytes Absolute: 0.7 10*3/uL (ref 0.1–1.0)
Monocytes Relative: 8 %
Neutro Abs: 3.9 10*3/uL (ref 1.7–7.7)
Neutrophils Relative %: 45 %
Platelets: 303 10*3/uL (ref 150–400)
RBC: 4.92 MIL/uL (ref 3.87–5.11)
RDW: 13 % (ref 11.5–15.5)
WBC: 8.8 10*3/uL (ref 4.0–10.5)

## 2016-12-14 LAB — COMPREHENSIVE METABOLIC PANEL
ALT: 15 U/L (ref 14–54)
AST: 19 U/L (ref 15–41)
Albumin: 4 g/dL (ref 3.5–5.0)
Alkaline Phosphatase: 117 U/L (ref 38–126)
Anion gap: 5 (ref 5–15)
BUN: 10 mg/dL (ref 6–20)
CO2: 26 mmol/L (ref 22–32)
Calcium: 9.5 mg/dL (ref 8.9–10.3)
Chloride: 106 mmol/L (ref 101–111)
Creatinine, Ser: 0.66 mg/dL (ref 0.44–1.00)
GFR calc Af Amer: >60 mL/min (ref >60)
GFR calc non Af Amer: >60 mL/min (ref >60)
Glucose, Bld: 95 mg/dL (ref 65–99)
Potassium: 4.2 mmol/L (ref 3.5–5.1)
Sodium: 137 mmol/L (ref 135–145)
Total Bilirubin: 0.4 mg/dL (ref 0.3–1.2)
Total Protein: 7.2 g/dL (ref 6.5–8.1)

## 2016-12-14 LAB — URINALYSIS, ROUTINE W REFLEX MICROSCOPIC
Bilirubin Urine: NEGATIVE
Glucose, UA: NEGATIVE mg/dL
Hgb urine dipstick: NEGATIVE
Ketones, ur: NEGATIVE mg/dL
Leukocytes, UA: NEGATIVE
Nitrite: NEGATIVE
Protein, ur: NEGATIVE mg/dL
Specific Gravity, Urine: 1.024 (ref 1.005–1.030)
pH: 5 (ref 5.0–8.0)

## 2016-12-14 LAB — PROTIME-INR
INR: 0.95
Prothrombin Time: 12.6 seconds (ref 11.4–15.2)

## 2016-12-14 LAB — APTT: aPTT: 27 seconds (ref 24–36)

## 2016-12-14 NOTE — Progress Notes (Signed)
Pt denies cardiac history, chest pain or sob. Pt states she is not diabetic. Pt at this time does not have anyone that can stay with her for the 24 hours after surgery. I made sure she understood that she could not drive herself home or be by herself for the 24 hours after surgery. She is trying to think of someone to do it. I told her if she could not find anyone, she needs to let Dr. Laurena Bering office know so that they can arrange for her to stay overnight after surgery.

## 2016-12-14 NOTE — Pre-Procedure Instructions (Signed)
JOANY KHATIB  12/14/2016    Your procedure is scheduled on Wednesday, December 19, 2016 at 3:15 PM.   Report to [facility] Entrance "A" Admitting Office at 12:15 PM.   Call this number if you have problems the morning of surgery: [phone]   Questions prior to day of surgery, please call [phone] between 8 & 4 PM.   Remember:  Do not eat food or drink liquids after midnight Tuesday, 12/18/16.  Take these medicines the morning of surgery with A SIP OF WATER: Bupropion (Wellbutrin), Fluoxetine (Prozac), Levothyroxine (Synthroid), Oxcarbazepine (Trileptal), Oxycodone ER, Gabapentin - if needed, Arnuity Ellipta inhaler - if needed, Albuterol inhaler - if needed (bring this inhaler with you day of surgery)  Stop Multivitamins as of today. Do not use NSAIDS (Ibuprofen, Aleve, etc) and Aspirin products prior to surgery.   Do not wear jewelry, make-up or nail polish.  Do not wear lotions, powders, perfumes or deodorant.  Do not shave 48 hours prior to surgery.    Do not bring valuables to the hospital.  [facility] is not responsible for any belongings or valuables.  Contacts, dentures or bridgework may not be worn into surgery.  Leave your suitcase in the car.  After surgery it may be brought to your room.  For patients admitted to the hospital, discharge time will be determined by your treatment team.  Patients discharged the day of surgery will not be allowed to drive home.   [place] - Preparing for Surgery  Before surgery, you can play an important role.  Because skin is not sterile, your skin needs to be as free of germs as possible.  You can reduce the number of germs on you skin by washing with CHG (chlorahexidine gluconate) soap before surgery.  CHG is an antiseptic cleaner which kills germs and bonds with the skin to continue killing germs even after washing.  Please DO NOT use if you have an allergy to CHG or antibacterial soaps.  If your skin becomes  reddened/irritated stop using the CHG and inform your nurse when you arrive at Short Stay.  Do not shave (including legs and underarms) for at least 48 hours prior to the first CHG shower.  You may shave your face.  Please follow these instructions carefully:   1.  Shower with CHG Soap the night before surgery and the                    morning of Surgery.  2.  If you choose to wash your hair, wash your hair first as usual with your       normal shampoo.  3.  After you shampoo, rinse your hair and body thoroughly to remove the shampoo.  4.  Use CHG as you would any other liquid soap.  You can apply chg directly       to the skin and wash gently with scrungie or a clean washcloth.  5.  Apply the CHG Soap to your body ONLY FROM THE NECK DOWN.        Do not use on open wounds or open sores.  Avoid contact with your eyes, ears, mouth and genitals (private parts).  Wash genitals (private parts) with your normal soap.  6.  Wash thoroughly, paying special attention to the area where your surgery        will be performed.  7.  Thoroughly rinse your body with warm water from the neck down.  8.  DO NOT shower/wash with your normal soap after using and rinsing off       the CHG Soap.  9.  Pat yourself dry with a clean towel.            10.  Wear clean pajamas.            11.  Place clean sheets on your bed the night of your first shower and do not        sleep with pets.  Day of Surgery  Do not apply any lotions/deodorants the morning of surgery.  Please wear clean clothes to the hospital.   Please read over the fact sheets that you were given.

## 2016-12-17 DIAGNOSIS — G894 Chronic pain syndrome: Secondary | ICD-10-CM | POA: Diagnosis not present

## 2016-12-17 DIAGNOSIS — M961 Postlaminectomy syndrome, not elsewhere classified: Secondary | ICD-10-CM | POA: Diagnosis not present

## 2016-12-19 ENCOUNTER — Ambulatory Visit (HOSPITAL_COMMUNITY)
Admission: RE | Admit: 2016-12-19 | Discharge: 2016-12-19 | Disposition: A | Discharge status: Home / Self Care | Payer: Medicare HMO | Source: Ambulatory Visit | Attending: Orthopedic Surgery | Admitting: Orthopedic Surgery

## 2016-12-19 ENCOUNTER — Ambulatory Visit (HOSPITAL_COMMUNITY): Payer: Medicare HMO | Admitting: Anesthesiology

## 2016-12-19 ENCOUNTER — Encounter (HOSPITAL_COMMUNITY): Payer: Self-pay | Admitting: Anesthesiology

## 2016-12-19 ENCOUNTER — Encounter (HOSPITAL_COMMUNITY)
Admission: RE | Disposition: A | Discharge status: Home / Self Care | Payer: Self-pay | Source: Ambulatory Visit | Attending: Orthopedic Surgery

## 2016-12-19 ENCOUNTER — Ambulatory Visit (HOSPITAL_COMMUNITY): Payer: Medicare HMO

## 2016-12-19 DIAGNOSIS — J45909 Unspecified asthma, uncomplicated: Secondary | ICD-10-CM | POA: Insufficient documentation

## 2016-12-19 DIAGNOSIS — M4854XA Collapsed vertebra, not elsewhere classified, thoracic region, initial encounter for fracture: Secondary | ICD-10-CM | POA: Insufficient documentation

## 2016-12-19 DIAGNOSIS — K449 Diaphragmatic hernia without obstruction or gangrene: Secondary | ICD-10-CM | POA: Diagnosis not present

## 2016-12-19 DIAGNOSIS — W11XXXA Fall on and from ladder, initial encounter: Secondary | ICD-10-CM | POA: Diagnosis not present

## 2016-12-19 DIAGNOSIS — E039 Hypothyroidism, unspecified: Secondary | ICD-10-CM | POA: Diagnosis not present

## 2016-12-19 DIAGNOSIS — Z981 Arthrodesis status: Secondary | ICD-10-CM | POA: Diagnosis not present

## 2016-12-19 DIAGNOSIS — M4316 Spondylolisthesis, lumbar region: Secondary | ICD-10-CM | POA: Diagnosis not present

## 2016-12-19 DIAGNOSIS — F319 Bipolar disorder, unspecified: Secondary | ICD-10-CM | POA: Insufficient documentation

## 2016-12-19 DIAGNOSIS — K219 Gastro-esophageal reflux disease without esophagitis: Secondary | ICD-10-CM | POA: Diagnosis not present

## 2016-12-19 DIAGNOSIS — G8929 Other chronic pain: Secondary | ICD-10-CM | POA: Diagnosis not present

## 2016-12-19 DIAGNOSIS — S22080A Wedge compression fracture of T11-T12 vertebra, initial encounter for closed fracture: Secondary | ICD-10-CM | POA: Diagnosis not present

## 2016-12-19 DIAGNOSIS — Z419 Encounter for procedure for purposes other than remedying health state, unspecified: Secondary | ICD-10-CM

## 2016-12-19 DIAGNOSIS — Z79899 Other long term (current) drug therapy: Secondary | ICD-10-CM | POA: Insufficient documentation

## 2016-12-19 DIAGNOSIS — J449 Chronic obstructive pulmonary disease, unspecified: Secondary | ICD-10-CM | POA: Diagnosis not present

## 2016-12-19 HISTORY — PX: KYPHOPLASTY: SHX5884

## 2016-12-19 SURGERY — KYPHOPLASTY
Anesthesia: General | Site: Back

## 2016-12-19 MED ORDER — IOPAMIDOL (ISOVUE-300) INJECTION 61%
INTRAVENOUS | Status: DC | PRN
  Administered 2016-12-19: 50 mL via INTRAVENOUS

## 2016-12-19 MED ORDER — FENTANYL CITRATE (PF) 100 MCG/2ML IJ SOLN
INTRAMUSCULAR | Status: AC
  Filled 2016-12-19: qty 2

## 2016-12-19 MED ORDER — BACITRACIN ZINC 500 UNIT/GM EX OINT
TOPICAL_OINTMENT | CUTANEOUS | Status: AC
  Filled 2016-12-19: qty 28.35

## 2016-12-19 MED ORDER — SUGAMMADEX SODIUM 200 MG/2ML IV SOLN
INTRAVENOUS | Status: DC | PRN
  Administered 2016-12-19: 200 mg via INTRAVENOUS

## 2016-12-19 MED ORDER — MEPERIDINE HCL 25 MG/ML IJ SOLN: 6.2500 mg | INTRAMUSCULAR | Status: DC | PRN

## 2016-12-19 MED ORDER — FENTANYL CITRATE (PF) 100 MCG/2ML IJ SOLN
INTRAMUSCULAR | Status: DC | PRN
  Administered 2016-12-19 (×2): 50 ug via INTRAVENOUS

## 2016-12-19 MED ORDER — CEFAZOLIN SODIUM-DEXTROSE 2-4 GM/100ML-% IV SOLN
INTRAVENOUS | Status: AC
  Filled 2016-12-19: qty 100

## 2016-12-19 MED ORDER — SUGAMMADEX SODIUM 200 MG/2ML IV SOLN
INTRAVENOUS | Status: AC
  Filled 2016-12-19: qty 2

## 2016-12-19 MED ORDER — FENTANYL CITRATE (PF) 100 MCG/2ML IJ SOLN
25.0000 ug | INTRAMUSCULAR | Status: DC | PRN
  Administered 2016-12-19: 50 ug via INTRAVENOUS

## 2016-12-19 MED ORDER — DEXAMETHASONE SODIUM PHOSPHATE 10 MG/ML IJ SOLN
INTRAMUSCULAR | Status: DC | PRN
  Administered 2016-12-19: 10 mg via INTRAVENOUS

## 2016-12-19 MED ORDER — ROCURONIUM BROMIDE 100 MG/10ML IV SOLN
INTRAVENOUS | Status: DC | PRN
  Administered 2016-12-19: 50 mg via INTRAVENOUS

## 2016-12-19 MED ORDER — PROPOFOL 10 MG/ML IV BOLUS
INTRAVENOUS | Status: DC | PRN
  Administered 2016-12-19: 160 mg via INTRAVENOUS
  Administered 2016-12-19: 30 mg via INTRAVENOUS

## 2016-12-19 MED ORDER — CEFAZOLIN SODIUM-DEXTROSE 2-4 GM/100ML-% IV SOLN
2.0000 g | INTRAVENOUS | Status: AC
  Administered 2016-12-19: 2 g via INTRAVENOUS
  Filled 2016-12-19: qty 100

## 2016-12-19 MED ORDER — LIDOCAINE HCL (CARDIAC) 20 MG/ML IV SOLN
INTRAVENOUS | Status: DC | PRN
  Administered 2016-12-19: 100 mg via INTRAVENOUS

## 2016-12-19 MED ORDER — FENTANYL CITRATE (PF) 250 MCG/5ML IJ SOLN
INTRAMUSCULAR | Status: AC
  Filled 2016-12-19: qty 5

## 2016-12-19 MED ORDER — 0.9 % SODIUM CHLORIDE (POUR BTL) OPTIME
TOPICAL | Status: DC | PRN
  Administered 2016-12-19: 1000 mL

## 2016-12-19 MED ORDER — MIDAZOLAM HCL 5 MG/5ML IJ SOLN
INTRAMUSCULAR | Status: DC | PRN
  Administered 2016-12-19: 2 mg via INTRAVENOUS

## 2016-12-19 MED ORDER — DEXAMETHASONE SODIUM PHOSPHATE 10 MG/ML IJ SOLN
INTRAMUSCULAR | Status: AC
  Filled 2016-12-19: qty 1

## 2016-12-19 MED ORDER — LACTATED RINGERS IV SOLN
Freq: Once | INTRAVENOUS | Status: AC
  Administered 2016-12-19 (×2): via INTRAVENOUS

## 2016-12-19 MED ORDER — ONDANSETRON HCL 4 MG/2ML IJ SOLN: 4.0000 mg | Freq: Once | INTRAMUSCULAR | Status: DC | PRN

## 2016-12-19 MED ORDER — LIDOCAINE 2% (20 MG/ML) 5 ML SYRINGE
INTRAMUSCULAR | Status: AC
  Filled 2016-12-19: qty 5

## 2016-12-19 MED ORDER — ONDANSETRON HCL 4 MG/2ML IJ SOLN
INTRAMUSCULAR | Status: AC
  Filled 2016-12-19: qty 2

## 2016-12-19 MED ORDER — ALBUTEROL SULFATE (2.5 MG/3ML) 0.083% IN NEBU
INHALATION_SOLUTION | RESPIRATORY_TRACT | Status: AC
  Filled 2016-12-19: qty 3

## 2016-12-19 MED ORDER — PROPOFOL 10 MG/ML IV BOLUS
INTRAVENOUS | Status: AC
  Filled 2016-12-19: qty 20

## 2016-12-19 MED ORDER — BACITRACIN 500 UNIT/GM EX OINT
TOPICAL_OINTMENT | CUTANEOUS | Status: DC | PRN
  Administered 2016-12-19: 1 via TOPICAL

## 2016-12-19 MED ORDER — ALBUTEROL SULFATE (2.5 MG/3ML) 0.083% IN NEBU
2.5000 mg | INHALATION_SOLUTION | Freq: Four times a day (QID) | RESPIRATORY_TRACT | Status: DC | PRN
  Administered 2016-12-19: 2.5 mg via RESPIRATORY_TRACT

## 2016-12-19 MED ORDER — ONDANSETRON HCL 4 MG/2ML IJ SOLN
INTRAMUSCULAR | Status: DC | PRN
  Administered 2016-12-19: 4 mg via INTRAVENOUS

## 2016-12-19 MED ORDER — IOPAMIDOL (ISOVUE-300) INJECTION 61%
INTRAVENOUS | Status: AC
  Filled 2016-12-19: qty 50

## 2016-12-19 MED ORDER — POVIDONE-IODINE 7.5 % EX SOLN: Freq: Once | CUTANEOUS | Status: DC

## 2016-12-19 MED ORDER — MIDAZOLAM HCL 2 MG/2ML IJ SOLN
INTRAMUSCULAR | Status: AC
  Filled 2016-12-19: qty 2

## 2016-12-19 SURGICAL SUPPLY — 51 items
BLADE SURG 15 STRL LF DISP TIS (BLADE) ×1 IMPLANT
BLADE SURG 15 STRL SS (BLADE) ×2
CEMENT BONE KYPHX HV R (Orthopedic Implant) ×1 IMPLANT
CEMENT KYPHON C01A KIT/MIXER (Cement) ×1 IMPLANT
COVER MAYO STAND STRL (DRAPES) ×2 IMPLANT
COVER SURGICAL LIGHT HANDLE (MISCELLANEOUS) ×2 IMPLANT
CURETTE EXPRESS SZ2 7MM (INSTRUMENTS) IMPLANT
CURRETTE EXPRESS SZ2 7MM (INSTRUMENTS) ×2
DRAPE C-ARM 42X72 X-RAY (DRAPES) ×3 IMPLANT
DRAPE HALF SHEET 40X57 (DRAPES) ×1 IMPLANT
DRAPE INCISE IOBAN 66X45 STRL (DRAPES) ×2 IMPLANT
DRAPE LAPAROTOMY T 102X78X121 (DRAPES) ×2 IMPLANT
DRAPE SURG 17X23 STRL (DRAPES) ×8 IMPLANT
DURAPREP 26ML APPLICATOR (WOUND CARE) ×3 IMPLANT
ELECT COATED BLADE 2.86 ST (ELECTRODE) ×1 IMPLANT
GAUZE SPONGE 2X2 8PLY STRL LF (GAUZE/BANDAGES/DRESSINGS) IMPLANT
GAUZE SPONGE 4X4 16PLY XRAY LF (GAUZE/BANDAGES/DRESSINGS) ×2 IMPLANT
GLOVE BIO SURGEON STRL SZ7 (GLOVE) ×2 IMPLANT
GLOVE BIO SURGEON STRL SZ8 (GLOVE) ×2 IMPLANT
GLOVE BIOGEL PI IND STRL 7.0 (GLOVE) ×1 IMPLANT
GLOVE BIOGEL PI IND STRL 8 (GLOVE) ×1 IMPLANT
GLOVE BIOGEL PI INDICATOR 7.0 (GLOVE) ×2
GLOVE BIOGEL PI INDICATOR 8 (GLOVE) ×2
GLOVE ECLIPSE 7.0 STRL STRAW (GLOVE) ×1 IMPLANT
GLOVE ECLIPSE 8.0 STRL XLNG CF (GLOVE) ×1 IMPLANT
GOWN STRL REUS W/ TWL LRG LVL3 (GOWN DISPOSABLE) ×2 IMPLANT
GOWN STRL REUS W/ TWL XL LVL3 (GOWN DISPOSABLE) ×1 IMPLANT
GOWN STRL REUS W/TWL LRG LVL3 (GOWN DISPOSABLE) ×4
GOWN STRL REUS W/TWL XL LVL3 (GOWN DISPOSABLE) ×4
KIT BASIN OR (CUSTOM PROCEDURE TRAY) ×2 IMPLANT
KIT ROOM TURNOVER OR (KITS) ×2 IMPLANT
MIXER KYPHON (MISCELLANEOUS) ×1 IMPLANT
NDL HYPO 25X1 1.5 SAFETY (NEEDLE) IMPLANT
NDL SPNL 18GX3.5 QUINCKE PK (NEEDLE) ×2 IMPLANT
NEEDLE 22X1 1/2 (OR ONLY) (NEEDLE) ×1 IMPLANT
NEEDLE HYPO 25X1 1.5 SAFETY (NEEDLE) IMPLANT
NEEDLE SPNL 18GX3.5 QUINCKE PK (NEEDLE) ×4 IMPLANT
NS IRRIG 1000ML POUR BTL (IV SOLUTION) ×2 IMPLANT
PACK UNIVERSAL I (CUSTOM PROCEDURE TRAY) ×2 IMPLANT
PAD ARMBOARD 7.5X6 YLW CONV (MISCELLANEOUS) ×4 IMPLANT
PENCIL BUTTON HOLSTER BLD 10FT (ELECTRODE) ×1 IMPLANT
POSITIONER HEAD PRONE TRACH (MISCELLANEOUS) ×2 IMPLANT
SPONGE GAUZE 2X2 STER 10/PKG (GAUZE/BANDAGES/DRESSINGS) ×1
SUT MNCRL AB 4-0 PS2 18 (SUTURE) ×2 IMPLANT
SYR BULB IRRIGATION 50ML (SYRINGE) ×2 IMPLANT
SYR CONTROL 10ML LL (SYRINGE) ×2 IMPLANT
TAPE CLOTH SURG 4X10 WHT LF (GAUZE/BANDAGES/DRESSINGS) ×1 IMPLANT
TOWEL OR 17X24 6PK STRL BLUE (TOWEL DISPOSABLE) ×2 IMPLANT
TOWEL OR 17X26 10 PK STRL BLUE (TOWEL DISPOSABLE) ×2 IMPLANT
TRAY KYPHOPAK 15/3 ONESTEP 1ST (MISCELLANEOUS) ×1 IMPLANT
TRAY KYPHOPAK 20/3 ONESTEP 1ST (MISCELLANEOUS) IMPLANT

## 2016-12-19 NOTE — H&P (Signed)
PREOPERATIVE H&P  Chief Complaint: Mid back pain  HPI: Victoria Levine is a 66 y.o. female who presents with ongoing pain in the mid back x 1 months  Ct scan and x-rays reveal a compression fracture at T12  Patient has failed multiple forms of conservative care and continues to have pain (see office notes for additional details regarding the patient's full course of treatment)  Past Medical History:  Diagnosis Date  . Anemia    low iron  . Asthma   . Bipolar disorder ([place])   . Chronic back pain   . DDD (degenerative disc disease)   . Depression   . Dyspnea   . GERD (gastroesophageal reflux disease)   . H/O alcohol abuse    28 years sober  . H/O suicide attempt   . Herpes   . Hiatal hernia   . Hypothyroid   . Migraines   . Osteoporosis 11/2013   T score -2.6 AP spine. 1 dose Reclast with reaction Prolia since 10/2011 most recent DEXA shows significant improvement at all measured sites.  . Pneumonia   . S/P adenoidectomy   . S/P tonsillectomy    Past Surgical History:  Procedure Laterality Date  . CESAREAN SECTION    . COLONOSCOPY     as a teenager  . Compression fracture spine    . DILATION AND CURETTAGE OF UTERUS    . EYE SURGERY Bilateral    CATARACTS REMOVED, NO LENS PLACED  . HYSTEROSCOPY    . LUMBAR FUSION    . TONSILLECTOMY     Social History   Social History  . Marital status: Single    Spouse name: N/A  . Number of children: N/A  . Years of education: N/A   Social History Main Topics  . Smoking status: Never Smoker  . Smokeless tobacco: Never Used  . Alcohol use No     Comment: hx of heavy alcohol use - 28 years sober  . Drug use: Yes    Types: Benzodiazepines     Comment: hx of cocaine use in her 20's  . Sexual activity: Not Currently    Birth control/ protection: Post-menopausal     Comment: intercourse age 75, sexual partners less than 5   Other Topics Concern  . Not on file   Social History Narrative  . No narrative on file     Family History  Problem Relation Age of Onset  . Adopted: Yes   Allergies  Allergen Reactions  . Prednisone Other (See Comments)    Patient claims Intolerance to Pred w/ anxiety and bi polar...  . Lithium Carbonate     UNSPECIFIED REACTION  Patient claims intolerance to Lithium after yrs of Rx  . Sulfa Antibiotics     UNSPECIFIED REACTION CHILDHOOD Rx   Prior to Admission medications   Medication Sig Start Date End Date Taking? Authorizing Provider  acyclovir ointment (ZOVIRAX) 5 % APPLY TO AFFECTED AREA EVERY 3 HOURS Patient taking differently: APPLY TO AFFECTED AREA EVERY 3 HOURS  AS NEEDED FOR OUTBREAKS 01/18/16  Yes [name], NP  albuterol (PROVENTIL HFA;VENTOLIN HFA) 108 (90 Base) MCG/ACT inhaler Inhale 2 puffs into the lungs every 6 (six) hours as needed for wheezing or shortness of breath.   Yes [provider]  ARNUITY ELLIPTA 200 MCG/ACT AEPB Inhale 2 puffs into the lungs 2 (two) times daily as needed (SHORTNESS OF BREATH).  09/21/16  Yes [provider]  buPROPion (WELLBUTRIN XL) 150  MG 24 hr tablet Take 150 mg by mouth daily. 10/01/16  Yes [provider]  calcium citrate-vitamin D (CALCIUM + D) 315-200 MG-UNIT per tablet Take 1 tablet by mouth daily. For bone health 10/18/14  Yes Lindell Spar I, NP  cyclobenzaprine (FLEXERIL) 10 MG tablet Take 10 mg by mouth 2 (two) times daily as needed for muscle spasms.   Yes [provider]  denosumab (PROLIA) 60 MG/ML SOLN injection Inject 60 mg into the skin every 6 (six) months. Administer in upper arm, thigh, or abdomen  NEXT IS DUE 03-2017   Yes [provider]  FLUoxetine (PROZAC) 20 MG capsule Take 20 mg by mouth 2 (two) times daily.    Yes [provider]  gabapentin (NEURONTIN) 600 MG tablet Take 600 mg by mouth 3 (three) times daily as needed (PAIN).  11/05/16  Yes [provider]  levothyroxine (SYNTHROID, LEVOTHROID) 100 MCG tablet Take 100 mcg by mouth daily  before breakfast.  11/06/16  Yes [provider]  Multiple Vitamin (MULTIVITAMIN) tablet Take 1 tablet by mouth daily. For low vitamin 10/18/14  Yes Nwoko, Herbert Pun I, NP  ondansetron (ZOFRAN) 8 MG tablet Take 8 mg by mouth every 8 (eight) hours as needed for nausea or vomiting (Migraine Head Ache symptoms).   Yes [provider]  oxcarbazepine (TRILEPTAL) 600 MG tablet Take 600 mg by mouth 2 (two) times daily.    Yes [provider]  OxyCODONE ER (XTAMPZA ER) 13.5 MG C12A Take 13.5 mg by mouth 2 (two) times daily. 11/22/16  Yes [provider]  QUEtiapine (SEROQUEL) 100 MG tablet Take 200 mg by mouth at bedtime. 10/12/16  Yes [provider]  ranitidine (ZANTAC) 300 MG tablet Take 300 mg by mouth at bedtime. 10/26/14  Yes [provider]  rizatriptan (MAXALT-MLT) 10 MG disintegrating tablet Take 10 mg by mouth as needed for migraine.  11/08/14  Yes [provider]  valACYclovir (VALTREX) 500 MG tablet TAKE 1 TABLET DAILY FOR SUPPRESSION OR TWICE A DAY FOR 3-5 DAYS AS NEEDED 01/17/16  Yes [name], NP  gabapentin (NEURONTIN) 300 MG capsule Take 1 capsule (300 mg total) by mouth 2 (two) times daily. For agitation/pain management Patient not taking: Reported on 12/13/2016 10/18/14   Lindell Spar I, NP  levothyroxine (SYNTHROID) 125 MCG tablet Take 1 tablet (125 mcg total) by mouth daily before breakfast. For low functioning thyroid Patient not taking: Reported on 12/13/2016 10/18/14   Lindell Spar I, NP     All other systems have been reviewed and were otherwise negative with the exception of those mentioned in the HPI and as above.  Physical Exam: There were no vitals filed for this visit.  General: Alert, no acute distress Cardiovascular: No pedal edema Respiratory: No cyanosis, no use of accessory musculature Skin: No lesions in the area of chief complaint Neurologic: Sensation intact distally Psychiatric: Patient is competent for consent  with normal mood and affect Lymphatic: No axillary or cervical lymphadenopathy  MUSCULOSKELETAL: + TTP mid back  Assessment/Plan: Thoracic 12 compression fracture Plan for Procedure(s): THORACIC 12 KYPHOPLASTY   Sinclair Ship, MD 12/19/2016 7:30 AM

## 2016-12-19 NOTE — Anesthesia Procedure Notes (Signed)
Procedure Name: Intubation Date/Time: 12/19/2016 2:20 PM Performed by: Luciana Axe K Pre-anesthesia Checklist: Patient identified, Emergency Drugs available, Suction available and Patient being monitored Patient Re-evaluated:Patient Re-evaluated prior to induction Oxygen Delivery Method: Circle System Utilized Preoxygenation: Pre-oxygenation with 100% oxygen Induction Type: IV induction Ventilation: Mask ventilation without difficulty Laryngoscope Size: Miller and 2 Grade View: Grade I Tube type: Oral Tube size: 7.0 mm Number of attempts: 1 Airway Equipment and Method: Stylet and Oral airway Placement Confirmation: ETT inserted through vocal cords under direct vision,  positive ETCO2 and breath sounds checked- equal and bilateral Secured at: 21 cm Tube secured with: Tape Dental Injury: Teeth and Oropharynx as per pre-operative assessment

## 2016-12-19 NOTE — Anesthesia Preprocedure Evaluation (Addendum)
Anesthesia Evaluation  Patient identified by MRN, date of birth, ID band Patient awake    Reviewed: Allergy & Precautions, NPO status , Patient's Chart, lab work & pertinent test results  Airway Mallampati: I  TM Distance: >3 FB Neck ROM: Full    Dental  (+) Teeth Intact, Dental Advisory Given   Pulmonary asthma , COPD,    + rhonchi        Cardiovascular Normal cardiovascular exam Rhythm:Regular Rate:Normal     Neuro/Psych  Headaches, PSYCHIATRIC DISORDERS Depression Bipolar Disorder  Neuromuscular disease    GI/Hepatic hiatal hernia, GERD  ,(+)     substance abuse  alcohol use,   Endo/Other  Hypothyroidism   Renal/GU      Musculoskeletal  (+) Arthritis , Osteoarthritis,    Abdominal   Peds  Hematology   Anesthesia Other Findings Chronic opioid use  Reproductive/Obstetrics                            Anesthesia Physical  Anesthesia Plan  ASA: III  Anesthesia Plan: General   Post-op Pain Management:    Induction: Intravenous  PONV Risk Score and Plan:   Airway Management Planned: Oral ETT  Additional Equipment:   Intra-op Plan:   Post-operative Plan: Extubation in OR  Informed Consent: I have reviewed the patients History and Physical, chart, labs and discussed the procedure including the risks, benefits and alternatives for the proposed anesthesia with the patient or authorized representative who has indicated his/her understanding and acceptance.     Plan Discussed with: CRNA, Anesthesiologist and Surgeon  Anesthesia Plan Comments:         Anesthesia Quick Evaluation

## 2016-12-19 NOTE — Progress Notes (Signed)
Notified Dr.Dumonski of patient having area above buttocks that is a herpes breakout per patient.  Patient has been treating with acyclovir.  Dr. Lynann Bologna is also aware that patient does not have a ride home.

## 2016-12-19 NOTE — Transfer of Care (Signed)
Immediate Anesthesia Transfer of Care Note  Patient: TARSHIA KOT  Procedure(s) Performed: THORACIC 12 KYPHOPLASTY; REQUEST 60 MINS AND FLIP ROOM (N/A Back)  Patient Location: PACU  Anesthesia Type:General  Level of Consciousness: oriented, drowsy and patient cooperative  Airway & Oxygen Therapy: Patient Spontanous Breathing and Patient connected to face mask oxygen  Post-op Assessment: Report given to RN and Post -op Vital signs reviewed and stable  Post vital signs: Reviewed  Last Vitals:  Vitals:   12/19/16 1144 12/19/16 1550  BP: 137/79 (!) 146/83  Pulse: 86 (!) 104  Resp: 20 12  Temp: 37 C 36.8 C  SpO2: 96% 99%    Last Pain:  Vitals:   12/19/16 1234  TempSrc:   PainSc: 10-Worst pain ever      Patients Stated Pain Goal: 3 (44/01/02 7253)  Complications: No apparent anesthesia complications

## 2016-12-19 NOTE — Anesthesia Postprocedure Evaluation (Signed)
Anesthesia Post Note  Patient: ELLOUISE MCWHIRTER  Procedure(s) Performed: THORACIC 12 KYPHOPLASTY; REQUEST 60 MINS AND FLIP ROOM (N/A Back)     Patient location during evaluation: PACU Anesthesia Type: General Level of consciousness: awake and alert Pain management: pain level controlled Vital Signs Assessment: post-procedure vital signs reviewed and stable Respiratory status: spontaneous breathing, nonlabored ventilation, respiratory function stable and patient connected to nasal cannula oxygen Cardiovascular status: blood pressure returned to baseline and stable Postop Assessment: no apparent nausea or vomiting Anesthetic complications: no    Last Vitals:  Vitals:   12/19/16 1144 12/19/16 1550  BP: 137/79 (!) 146/83  Pulse: 86 (!) 104  Resp: 20 12  Temp: 37 C 36.8 C  SpO2: 96% 99%    Last Pain:  Vitals:   12/19/16 1550  TempSrc:   PainSc: Asleep                 Ahlivia Salahuddin

## 2016-12-20 ENCOUNTER — Encounter (HOSPITAL_COMMUNITY): Payer: Self-pay | Admitting: Orthopedic Surgery

## 2016-12-20 NOTE — Op Note (Signed)
NAME:  Victoria Levine, Victoria Levine                 ACCOUNT NO.:  MEDICAL RECORD NO.:  [id number]  PHYSICIAN:  Phylliss Bob, MD           DATE OF BIRTH:  DATE OF PROCEDURE:  12/19/2016                              OPERATIVE REPORT   PREOPERATIVE DIAGNOSIS:  T12 compression fracture.  POSTOPERATIVE DIAGNOSIS:  T12 compression fracture.  PROCEDURE:  T12 kyphoplasty.  SURGEON:  Phylliss Bob, MD  ASSISTANT:  Pricilla Holm, PA-C  ANESTHESIA:  General endotracheal anesthesia.  COMPLICATIONS:  None.  DISPOSITION:  Stable.  ESTIMATED BLOOD LOSS:  Minimal.  INDICATIONS FOR SURGERY:  Briefly, [name] is a pleasant 66 year old female who did present to me with severe pain in her back following a fall from a stepladder on 11/19/2016.  The patient's CAT scan and x-rays did clearly reveal substantial collapse at the T12 level involving the superior and inferior endplates.  The patient had ongoing, rather debilitating pain.  At her evaluation, she did feel that her pain was increasing and severe, and she did wish to proceed with surgery.  We did discuss the pros and cons of the surgery outlined above.  The patient was fully aware of the risks and limitations of surgery and did elect to proceed.  OPERATIVE DETAILS:  On 12/19/2016, the patient was brought to surgery, and general endotracheal anesthesia was administered.  The patient was placed prone on a well-padded flat Jackson bed.  Gels were placed under the patient's chest and hips.  Antibiotics were given.  AP and lateral fluoroscopy were brought into the field.  Two very small stab incisions were made at the lateral aspect of the T12 pedicles.  I did advance Jamshidi trocars across the T12 pedicles in the usual fashion.  I then used a curette, and I did inflate kyphoplasty balloons.  Partial restoration of the superior endplate was noted.  At this point, I did introduce approximately 3 mL of cement in each cannula for a total of 6 mL  of cement.  I did note excellent interdigitation of the cement. There was no abnormal extravasation of cement posteriorly through the spinal canal.  I was very pleased with the final AP and lateral fluoroscopic images. The cement was then allowed to harden.  The trocars were then removed. The wound was then irrigated.  The wound was then closed using 4-0 Monocryl.  Bacitracin and a sterile dressing were applied.  All instrument counts were correct at the termination of the procedure.     Phylliss Bob, MD     MD/MEDQ  D:  12/19/2016  T:  12/20/2016  Job:  [id number]  cc:   Elisha Ponder PA-C Lilian Coma, MD

## 2016-12-26 DIAGNOSIS — Z9889 Other specified postprocedural states: Secondary | ICD-10-CM | POA: Diagnosis not present

## 2017-01-15 DIAGNOSIS — F441 Dissociative fugue: Secondary | ICD-10-CM | POA: Diagnosis not present

## 2017-01-15 DIAGNOSIS — F4310 Post-traumatic stress disorder, unspecified: Secondary | ICD-10-CM | POA: Diagnosis not present

## 2017-01-15 DIAGNOSIS — F319 Bipolar disorder, unspecified: Secondary | ICD-10-CM | POA: Diagnosis not present

## 2017-01-18 DIAGNOSIS — G894 Chronic pain syndrome: Secondary | ICD-10-CM | POA: Diagnosis not present

## 2017-01-18 DIAGNOSIS — M9903 Segmental and somatic dysfunction of lumbar region: Secondary | ICD-10-CM | POA: Diagnosis not present

## 2017-01-18 DIAGNOSIS — M961 Postlaminectomy syndrome, not elsewhere classified: Secondary | ICD-10-CM | POA: Diagnosis not present

## 2017-01-21 ENCOUNTER — Telehealth: Payer: Self-pay | Admitting: Gynecology

## 2017-01-21 NOTE — Telephone Encounter (Signed)
Duplicated encounter.

## 2017-02-19 ENCOUNTER — Encounter: Payer: Self-pay | Admitting: Gynecology

## 2017-02-20 NOTE — Telephone Encounter (Addendum)
Pt due for Prolia 02/16/17 Complete exam [place], last one 09/17, Recent fall with fracture T 12 , will ok Prolia with surgeon. Agreeable to come in for complete and Prolia same visit.  Calcium level 02/18  10.3.  Insurance [place] N/A, Co pay $45 with OV, Co iinsurance 20% approx 220.84  OOPM (1700). PA needed [id number] valid 05/20/15 thru 05/19/17 talked with Grayce Sessions ref # [id number], PC ot pt she will schedule routed to scheduling. Appointment 02/26/17 prolia and complete

## 2017-02-21 DIAGNOSIS — J45901 Unspecified asthma with (acute) exacerbation: Secondary | ICD-10-CM | POA: Diagnosis not present

## 2017-02-21 DIAGNOSIS — R52 Pain, unspecified: Secondary | ICD-10-CM | POA: Diagnosis not present

## 2017-02-21 DIAGNOSIS — J42 Unspecified chronic bronchitis: Secondary | ICD-10-CM | POA: Diagnosis not present

## 2017-02-21 DIAGNOSIS — Z1211 Encounter for screening for malignant neoplasm of colon: Secondary | ICD-10-CM | POA: Diagnosis not present

## 2017-02-23 ENCOUNTER — Encounter: Payer: Self-pay | Admitting: Women's Health

## 2017-02-26 ENCOUNTER — Encounter: Payer: Medicare HMO | Admitting: Women's Health

## 2017-02-26 NOTE — Telephone Encounter (Signed)
Pt rescheduled appointment to 03/05/17

## 2017-03-05 ENCOUNTER — Encounter: Payer: Self-pay | Admitting: Women's Health

## 2017-03-05 ENCOUNTER — Ambulatory Visit: Payer: Medicare HMO | Admitting: Women's Health

## 2017-03-05 VITALS — BP 120/78 | Ht 64.0 in | Wt 172.0 lb

## 2017-03-05 DIAGNOSIS — Z23 Encounter for immunization: Secondary | ICD-10-CM

## 2017-03-05 DIAGNOSIS — M810 Age-related osteoporosis without current pathological fracture: Secondary | ICD-10-CM | POA: Diagnosis not present

## 2017-03-05 DIAGNOSIS — Z01419 Encounter for gynecological examination (general) (routine) without abnormal findings: Secondary | ICD-10-CM

## 2017-03-05 DIAGNOSIS — Z1211 Encounter for screening for malignant neoplasm of colon: Secondary | ICD-10-CM | POA: Diagnosis not present

## 2017-03-05 MED ORDER — DENOSUMAB 60 MG/ML ~~LOC~~ SOLN: 60.0000 mg | SUBCUTANEOUS | 0 refills | Status: DC

## 2017-03-05 MED ORDER — DENOSUMAB 60 MG/ML ~~LOC~~ SOLN
60.0000 mg | Freq: Once | SUBCUTANEOUS | Status: AC
  Administered 2017-03-05: 60 mg via SUBCUTANEOUS

## 2017-03-05 MED ORDER — DENOSUMAB 60 MG/ML ~~LOC~~ SOLN: 60.0000 mg | Freq: Once | SUBCUTANEOUS | Status: DC

## 2017-03-05 NOTE — Progress Notes (Signed)
Victoria Levine September 10, 1950 [id number]    History:    Presents for breast and pelvic exam. Postmenopausal on no HRT with no bleeding. Normal Pap and mammogram history. Osteoporosis on Prolia since 2013 with improved DEXA's. 2017 T score -1.9. Has had both the Pneumovax and shingles vaccine. Biggest health problem is chronic pain,  back surgery 12/2016 after fall off of a ladder, pain medication is managed through a pain clinic. Not sexually active. Declines colonoscopy.  Past medical history, past surgical history, family history and social history were all reviewed and documented in the EPIC chart. One daughter lives in [place]. Sober greater than 20 years.  ROS:  A ROS was performed and pertinent positives and negatives are included.  Exam:  Vitals:   03/05/17 1240  BP: 120/78  Weight: 172 lb (78 kg)  Height: 5\' 4"  (1.626 m)   Body mass index is 29.52 kg/m.   General appearance:  Normal Thyroid:  Symmetrical, normal in size, without palpable masses or nodularity. Respiratory  Auscultation:  Clear without wheezing or rhonchi Cardiovascular  Auscultation:  Regular rate, without rubs, murmurs or gallops  Edema/varicosities:  Not grossly evident Abdominal  Soft,nontender, without masses, guarding or rebound.  Liver/spleen:  No organomegaly noted  Hernia:  None appreciated  Skin  Inspection:  Grossly normal   Breasts: Examined lying and sitting.     Right: Without masses, retractions, discharge or axillary adenopathy.     Left: Without masses, retractions, discharge or axillary adenopathy. Gentitourinary   Inguinal/mons:  Normal without inguinal adenopathy  External genitalia:  Normal  BUS/Urethra/Skene's glands:  Normal  Vagina: Mild atrophy  Cervix:  Normal  Uterus:   normal in size, shape and contour.  Midline and mobile  Adnexa/parametria:     Rt: Without masses or tenderness.   Lt: Without masses or tenderness.  Anus and perineum: Normal  Digital rectal  exam: Normal sphincter tone without palpated masses or tenderness  Assessment/Plan:  66 y.o. WF G3 P1 for breast and pelvic exam.  Osteoporosis on Prolia GERD-GI manages Bipolar, hypothyroidism-primary care/psychiatrist. Chronic back pain-pain clinic manages HSV-no outbreaks  Plan: Osteoporosis treatment reviewed will continue on Prolia 60 mg every 6 months, tolerating well . SBE's, annual screening mammogram overdue instructed to schedule. Calcium rich diet, vitamin D 2000 daily encouraged. Home safety, fall prevention and importance of weightbearing exercise and balance type exercise reviewed.   Victoria Levine, 1:08 PM 03/05/2017

## 2017-03-05 NOTE — Patient Instructions (Signed)
Osteoporosis Osteoporosis happens when your bones become thinner and weaker. Weak bones can break (fracture) more easily when you slip or fall. Bones most at risk of breaking are in the hip, wrist, and spine. Follow these instructions at home:  Get enough calcium and vitamin D. These nutrients are good for your bones.  Exercise as told by your doctor.  Do not use any tobacco products. This includes cigarettes, chewing tobacco, and electronic cigarettes. If you need help quitting, ask your doctor.  Limit the amount of alcohol you drink.  Take medicines only as told by your doctor.  Keep all follow-up visits as told by your doctor. This is important.  Take care at home to prevent falls. Some ways to do this are: ? Keep rooms well lit and tidy. ? Put safety rails on your stairs. ? Put a rubber mat in the bathroom and other places that are often wet or slippery. Get help right away if:  You fall.  You hurt yourself. This information is not intended to replace advice given to you by your health care provider. Make sure you discuss any questions you have with your health care provider. Document Released: 05/28/2011 Document Revised: 08/11/2015 Document Reviewed: 08/13/2013 Elsevier Interactive Patient Education  2018 [employer] Health Maintenance for Postmenopausal Women Menopause is a normal process in which your reproductive ability comes to an end. This process happens gradually over a span of months to years, usually between the ages of 48 and 55. Menopause is complete when you have missed 12 consecutive menstrual periods. It is important to talk with your health care provider about some of the most common conditions that affect postmenopausal women, such as heart disease, cancer, and bone loss (osteoporosis). Adopting a healthy lifestyle and getting preventive care can help to promote your health and wellness. Those actions can also lower your chances of developing some of these  common conditions. What should I know about menopause? During menopause, you may experience a number of symptoms, such as:  Moderate-to-severe hot flashes.  Night sweats.  Decrease in sex drive.  Mood swings.  Headaches.  Tiredness.  Irritability.  Memory problems.  Insomnia.  Choosing to treat or not to treat menopausal changes is an individual decision that you make with your health care provider. What should I know about hormone replacement therapy and supplements? Hormone therapy products are effective for treating symptoms that are associated with menopause, such as hot flashes and night sweats. Hormone replacement carries certain risks, especially as you become older. If you are thinking about using estrogen or estrogen with progestin treatments, discuss the benefits and risks with your health care provider. What should I know about heart disease and stroke? Heart disease, heart attack, and stroke become more likely as you age. This may be due, in part, to the hormonal changes that your body experiences during menopause. These can affect how your body processes dietary fats, triglycerides, and cholesterol. Heart attack and stroke are both medical emergencies. There are many things that you can do to help prevent heart disease and stroke:  Have your blood pressure checked at least every 1-2 years. High blood pressure causes heart disease and increases the risk of stroke.  If you are 55-79 years old, ask your health care provider if you should take aspirin to prevent a heart attack or a stroke.  Do not use any tobacco products, including cigarettes, chewing tobacco, or electronic cigarettes. If you need help quitting, ask your health care provider.  It   is important to eat a healthy diet and maintain a healthy weight. ? Be sure to include plenty of vegetables, fruits, low-fat dairy products, and lean protein. ? Avoid eating foods that are high in solid fats, added sugars, or  salt (sodium).  Get regular exercise. This is one of the most important things that you can do for your health. ? Try to exercise for at least 150 minutes each week. The type of exercise that you do should increase your heart rate and make you sweat. This is known as moderate-intensity exercise. ? Try to do strengthening exercises at least twice each week. Do these in addition to the moderate-intensity exercise.  Know your numbers.Ask your health care provider to check your cholesterol and your blood glucose. Continue to have your blood tested as directed by your health care provider.  What should I know about cancer screening? There are several types of cancer. Take the following steps to reduce your risk and to catch any cancer development as early as possible. Breast Cancer  Practice breast self-awareness. ? This means understanding how your breasts normally appear and feel. ? It also means doing regular breast self-exams. Let your health care provider know about any changes, no matter how small.  If you are 24 or older, have a clinician do a breast exam (clinical breast exam or CBE) every year. Depending on your age, family history, and medical history, it may be recommended that you also have a yearly breast X-ray (mammogram).  If you have a family history of breast cancer, talk with your health care provider about genetic screening.  If you are at high risk for breast cancer, talk with your health care provider about having an MRI and a mammogram every year.  Breast cancer (BRCA) gene test is recommended for women who have family members with BRCA-related cancers. Results of the assessment will determine the need for genetic counseling and BRCA1 and for BRCA2 testing. BRCA-related cancers include these types: ? Breast. This occurs in males or females. ? Ovarian. ? Tubal. This may also be called fallopian tube cancer. ? Cancer of the abdominal or pelvic lining (peritoneal  cancer). ? Prostate. ? Pancreatic.  Cervical, Uterine, and Ovarian Cancer Your health care provider may recommend that you be screened regularly for cancer of the pelvic organs. These include your ovaries, uterus, and vagina. This screening involves a pelvic exam, which includes checking for microscopic changes to the surface of your cervix (Pap test).  For women ages 21-65, health care providers may recommend a pelvic exam and a Pap test every three years. For women ages 31-65, they may recommend the Pap test and pelvic exam, combined with testing for human papilloma virus (HPV), every five years. Some types of HPV increase your risk of cervical cancer. Testing for HPV may also be done on women of any age who have unclear Pap test results.  Other health care providers may not recommend any screening for nonpregnant women who are considered low risk for pelvic cancer and have no symptoms. Ask your health care provider if a screening pelvic exam is right for you.  If you have had past treatment for cervical cancer or a condition that could lead to cancer, you need Pap tests and screening for cancer for at least 20 years after your treatment. If Pap tests have been discontinued for you, your risk factors (such as having a new sexual partner) need to be reassessed to determine if you should start having screenings again.  Some women have medical problems that increase the chance of getting cervical cancer. In these cases, your health care provider may recommend that you have screening and Pap tests more often.  If you have a family history of uterine cancer or ovarian cancer, talk with your health care provider about genetic screening.  If you have vaginal bleeding after reaching menopause, tell your health care provider.  There are currently no reliable tests available to screen for ovarian cancer.  Lung Cancer Lung cancer screening is recommended for adults 44-28 years old who are at high risk for  lung cancer because of a history of smoking. A yearly low-dose CT scan of the lungs is recommended if you:  Currently smoke.  Have a history of at least 30 pack-years of smoking and you currently smoke or have quit within the past 15 years. A pack-year is smoking an average of one pack of cigarettes per day for one year.  Yearly screening should:  Continue until it has been 15 years since you quit.  Stop if you develop a health problem that would prevent you from having lung cancer treatment.  Colorectal Cancer  This type of cancer can be detected and can often be prevented.  Routine colorectal cancer screening usually begins at age 35 and continues through age 67.  If you have risk factors for colon cancer, your health care provider may recommend that you be screened at an earlier age.  If you have a family history of colorectal cancer, talk with your health care provider about genetic screening.  Your health care provider may also recommend using home test kits to check for hidden blood in your stool.  A small camera at the end of a tube can be used to examine your colon directly (sigmoidoscopy or colonoscopy). This is done to check for the earliest forms of colorectal cancer.  Direct examination of the colon should be repeated every 5-10 years until age 17. However, if early forms of precancerous polyps or small growths are found or if you have a family history or genetic risk for colorectal cancer, you may need to be screened more often.  Skin Cancer  Check your skin from head to toe regularly.  Monitor any moles. Be sure to tell your health care provider: ? About any new moles or changes in moles, especially if there is a change in a mole's shape or color. ? If you have a mole that is larger than the size of a pencil eraser.  If any of your family members has a history of skin cancer, especially at a Burt Piatek age, talk with your health care provider about genetic  screening.  Always use sunscreen. Apply sunscreen liberally and repeatedly throughout the day.  Whenever you are outside, protect yourself by wearing long sleeves, pants, a wide-brimmed hat, and sunglasses.  What should I know about osteoporosis? Osteoporosis is a condition in which bone destruction happens more quickly than new bone creation. After menopause, you may be at an increased risk for osteoporosis. To help prevent osteoporosis or the bone fractures that can happen because of osteoporosis, the following is recommended:  If you are 32-41 years old, get at least 1,000 mg of calcium and at least 600 mg of vitamin D per day.  If you are older than age 42 but younger than age 66, get at least 1,200 mg of calcium and at least 600 mg of vitamin D per day.  If you are older than age 31, get  at least 1,200 mg of calcium and at least 800 mg of vitamin D per day.  Smoking and excessive alcohol intake increase the risk of osteoporosis. Eat foods that are rich in calcium and vitamin D, and do weight-bearing exercises several times each week as directed by your health care provider. What should I know about how menopause affects my mental health? Depression may occur at any age, but it is more common as you become older. Common symptoms of depression include:  Low or sad mood.  Changes in sleep patterns.  Changes in appetite or eating patterns.  Feeling an overall lack of motivation or enjoyment of activities that you previously enjoyed.  Frequent crying spells.  Talk with your health care provider if you think that you are experiencing depression. What should I know about immunizations? It is important that you get and maintain your immunizations. These include:  Tetanus, diphtheria, and pertussis (Tdap) booster vaccine.  Influenza every year before the flu season begins.  Pneumonia vaccine.  Shingles vaccine.  Your health care provider may also recommend other  immunizations. This information is not intended to replace advice given to you by your health care provider. Make sure you discuss any questions you have with your health care provider. Document Released: 04/27/2005 Document Revised: 09/23/2015 Document Reviewed: 12/07/2014 Elsevier Interactive Patient Education  2018 [employer].

## 2017-03-05 NOTE — Progress Notes (Signed)
Prolia Given 03/05/17  Next injection 09/04/17

## 2017-03-18 DIAGNOSIS — M961 Postlaminectomy syndrome, not elsewhere classified: Secondary | ICD-10-CM | POA: Diagnosis not present

## 2017-03-18 DIAGNOSIS — G894 Chronic pain syndrome: Secondary | ICD-10-CM | POA: Diagnosis not present

## 2017-03-18 DIAGNOSIS — M9903 Segmental and somatic dysfunction of lumbar region: Secondary | ICD-10-CM | POA: Diagnosis not present

## 2017-03-20 NOTE — Telephone Encounter (Signed)
prolia given 03/05/17  Next injection 09/04/17

## 2017-04-16 DIAGNOSIS — F441 Dissociative fugue: Secondary | ICD-10-CM | POA: Diagnosis not present

## 2017-04-16 DIAGNOSIS — F4310 Post-traumatic stress disorder, unspecified: Secondary | ICD-10-CM | POA: Diagnosis not present

## 2017-04-16 DIAGNOSIS — F319 Bipolar disorder, unspecified: Secondary | ICD-10-CM | POA: Diagnosis not present

## 2017-04-19 DIAGNOSIS — J45901 Unspecified asthma with (acute) exacerbation: Secondary | ICD-10-CM | POA: Diagnosis not present

## 2017-04-26 DIAGNOSIS — J441 Chronic obstructive pulmonary disease with (acute) exacerbation: Secondary | ICD-10-CM | POA: Diagnosis not present

## 2017-05-01 ENCOUNTER — Telehealth: Payer: Self-pay | Admitting: *Deleted

## 2017-05-01 MED ORDER — VALACYCLOVIR HCL 500 MG PO TABS: ORAL_TABLET | ORAL | 1 refills | Status: DC

## 2017-05-01 MED ORDER — ACYCLOVIR 5 % EX OINT: TOPICAL_OINTMENT | Freq: Three times a day (TID) | CUTANEOUS | 1 refills | Status: DC

## 2017-05-01 NOTE — Telephone Encounter (Signed)
Yes okay for refills of Valtrex 500 twice daily for 3-5 days #30 with 1 refill and acyclovir 5% ointment 3 times a day when necessary with 1 refill.

## 2017-05-01 NOTE — Telephone Encounter (Signed)
Pt informed, Rx sent. 

## 2017-05-01 NOTE — Telephone Encounter (Signed)
Pt called requesting Rx for valtrex 500 mg and acyclovir 5 % ointment refill, both last filled in 2017. Okay to refill?

## 2017-05-31 DIAGNOSIS — J441 Chronic obstructive pulmonary disease with (acute) exacerbation: Secondary | ICD-10-CM | POA: Diagnosis not present

## 2017-05-31 DIAGNOSIS — R030 Elevated blood-pressure reading, without diagnosis of hypertension: Secondary | ICD-10-CM | POA: Diagnosis not present

## 2017-06-10 DIAGNOSIS — M9903 Segmental and somatic dysfunction of lumbar region: Secondary | ICD-10-CM | POA: Diagnosis not present

## 2017-06-10 DIAGNOSIS — M961 Postlaminectomy syndrome, not elsewhere classified: Secondary | ICD-10-CM | POA: Diagnosis not present

## 2017-06-10 DIAGNOSIS — G894 Chronic pain syndrome: Secondary | ICD-10-CM | POA: Diagnosis not present

## 2017-06-17 DIAGNOSIS — D509 Iron deficiency anemia, unspecified: Secondary | ICD-10-CM | POA: Diagnosis not present

## 2017-06-17 DIAGNOSIS — E559 Vitamin D deficiency, unspecified: Secondary | ICD-10-CM | POA: Diagnosis not present

## 2017-06-17 DIAGNOSIS — E7800 Pure hypercholesterolemia, unspecified: Secondary | ICD-10-CM | POA: Diagnosis not present

## 2017-06-17 DIAGNOSIS — E039 Hypothyroidism, unspecified: Secondary | ICD-10-CM | POA: Diagnosis not present

## 2017-06-17 DIAGNOSIS — Z79899 Other long term (current) drug therapy: Secondary | ICD-10-CM | POA: Diagnosis not present

## 2017-07-24 DIAGNOSIS — F441 Dissociative fugue: Secondary | ICD-10-CM | POA: Diagnosis not present

## 2017-07-24 DIAGNOSIS — F4310 Post-traumatic stress disorder, unspecified: Secondary | ICD-10-CM | POA: Diagnosis not present

## 2017-07-24 DIAGNOSIS — F319 Bipolar disorder, unspecified: Secondary | ICD-10-CM | POA: Diagnosis not present

## 2017-07-29 ENCOUNTER — Telehealth: Payer: Self-pay | Admitting: *Deleted

## 2017-07-29 NOTE — Telephone Encounter (Signed)
Please call, yes acyclovir cream can be very expensive, acyclovir 200 mg 5 times daily as needed #30 with 6 refills.  Review only needs to use one outbreak is present.  Should be $4-$5.

## 2017-07-29 NOTE — Telephone Encounter (Signed)
Pt states should like to see if Victoria Chol NP can replace  acyclovir ointment (ZOVIRAX) 5 %  Due to cost to expensive. Will route to Provider

## 2017-07-29 NOTE — Telephone Encounter (Addendum)
Deductible  (Amount Met)   OOP MAX individual: $3400 ($75 met)  Annual exam 03/05/17  Calcium   9.5          Date 12/14/16  Upcoming dental procedures NO  Prior Authorization needed NO  Pt estimated Cost: $225  Appt 09/04/17 @2:30      Coverage Details:20% of $1084:$216  20% 45:$9 

## 2017-07-31 NOTE — Telephone Encounter (Signed)
Spoke with Pt she states she prefers a cream. Seems to work better for her than pills.  Will route to Elon Alas for advice

## 2017-08-01 ENCOUNTER — Telehealth: Payer: Self-pay | Admitting: *Deleted

## 2017-08-01 NOTE — Telephone Encounter (Signed)
Pt called stating she gets her Bone Density Scans at [facility]  She would like to start getting them done here at [facility] If this is ok to do so, when do I need to put her on the schedule  to obtain another scan?    Last DEXA 01/09/16  Will route to DR. Lavoie for advice to clarify the recommend time frame to do a Bone Density Scan  for this pt.

## 2017-08-02 NOTE — Telephone Encounter (Signed)
Given Osteopenia with T-Score of -2.0 on BD 12/2015, needs a repeat BD at 1 years, 12/2017.

## 2017-09-03 DIAGNOSIS — E039 Hypothyroidism, unspecified: Secondary | ICD-10-CM | POA: Diagnosis not present

## 2017-09-03 DIAGNOSIS — E7800 Pure hypercholesterolemia, unspecified: Secondary | ICD-10-CM | POA: Diagnosis not present

## 2017-09-04 ENCOUNTER — Ambulatory Visit: Payer: Medicare HMO

## 2017-09-05 NOTE — Telephone Encounter (Signed)
Pt cancelled appt due to illness will follow-up with her next week regarding Prolia

## 2017-09-10 DIAGNOSIS — G894 Chronic pain syndrome: Secondary | ICD-10-CM | POA: Diagnosis not present

## 2017-09-10 DIAGNOSIS — M961 Postlaminectomy syndrome, not elsewhere classified: Secondary | ICD-10-CM | POA: Diagnosis not present

## 2017-09-10 DIAGNOSIS — M9903 Segmental and somatic dysfunction of lumbar region: Secondary | ICD-10-CM | POA: Diagnosis not present

## 2017-09-13 NOTE — Telephone Encounter (Signed)
Left message to scheduled appt for Prolia. Pt cancelled previous appt

## 2017-09-20 ENCOUNTER — Other Ambulatory Visit: Payer: Self-pay

## 2017-09-20 ENCOUNTER — Emergency Department (HOSPITAL_COMMUNITY): Payer: Medicare HMO

## 2017-09-20 ENCOUNTER — Emergency Department (HOSPITAL_COMMUNITY)
Admission: EM | Admit: 2017-09-20 | Discharge: 2017-09-20 | Disposition: A | Discharge status: Home / Self Care | Payer: Medicare HMO | Attending: Emergency Medicine | Admitting: Emergency Medicine

## 2017-09-20 ENCOUNTER — Encounter (HOSPITAL_COMMUNITY): Payer: Self-pay | Admitting: Emergency Medicine

## 2017-09-20 DIAGNOSIS — R0781 Pleurodynia: Secondary | ICD-10-CM | POA: Diagnosis not present

## 2017-09-20 DIAGNOSIS — S20211A Contusion of right front wall of thorax, initial encounter: Secondary | ICD-10-CM | POA: Insufficient documentation

## 2017-09-20 DIAGNOSIS — E039 Hypothyroidism, unspecified: Secondary | ICD-10-CM | POA: Insufficient documentation

## 2017-09-20 DIAGNOSIS — Y93E9 Activity, other interior property and clothing maintenance: Secondary | ICD-10-CM | POA: Insufficient documentation

## 2017-09-20 DIAGNOSIS — Z79899 Other long term (current) drug therapy: Secondary | ICD-10-CM | POA: Insufficient documentation

## 2017-09-20 DIAGNOSIS — Y999 Unspecified external cause status: Secondary | ICD-10-CM | POA: Diagnosis not present

## 2017-09-20 DIAGNOSIS — S299XXA Unspecified injury of thorax, initial encounter: Secondary | ICD-10-CM | POA: Diagnosis not present

## 2017-09-20 DIAGNOSIS — J45909 Unspecified asthma, uncomplicated: Secondary | ICD-10-CM | POA: Diagnosis not present

## 2017-09-20 DIAGNOSIS — W1789XA Other fall from one level to another, initial encounter: Secondary | ICD-10-CM | POA: Diagnosis not present

## 2017-09-20 DIAGNOSIS — W19XXXA Unspecified fall, initial encounter: Secondary | ICD-10-CM

## 2017-09-20 DIAGNOSIS — Y92002 Bathroom of unspecified non-institutional (private) residence single-family (private) house as the place of occurrence of the external cause: Secondary | ICD-10-CM | POA: Insufficient documentation

## 2017-09-20 MED ORDER — NAPROXEN 500 MG PO TABS
250.0000 mg | ORAL_TABLET | Freq: Once | ORAL | Status: AC
  Administered 2017-09-20: 250 mg via ORAL
  Filled 2017-09-20: qty 1

## 2017-09-20 MED ORDER — LIDOCAINE 5 % EX PTCH
1.0000 | MEDICATED_PATCH | CUTANEOUS | Status: DC
  Administered 2017-09-20: 1 via TRANSDERMAL
  Filled 2017-09-20: qty 1

## 2017-09-20 MED ORDER — OXYCODONE HCL 5 MG PO TABS
10.0000 mg | ORAL_TABLET | Freq: Once | ORAL | Status: AC
Start: 2017-09-20 — End: 2017-09-20
  Administered 2017-09-20: 10 mg via ORAL
  Filled 2017-09-20: qty 2

## 2017-09-20 MED ORDER — LIDOCAINE 5 % EX PTCH: 1.0000 | MEDICATED_PATCH | CUTANEOUS | 0 refills | Status: DC

## 2017-09-20 NOTE — ED Provider Notes (Signed)
[facility] DEPT Provider Note   CSN: [id number] Arrival date & time: 09/20/17  1339     History   Chief Complaint Chief Complaint  Patient presents with  . Fall    HPI Victoria Levine is a 67 y.o. female.  The history is provided by the patient. No language interpreter was used.  Fall    Victoria Levine is a 67 y.o. female who presents to the Emergency Department complaining of fall. On Wednesday she was at home in the bathroom sitting on the counter trying to change a lightbulb when she slipped off the counter and fell onto the ground. She struck her head and right chest wall. Unsure if she had loss of consciousness. She reports pain to her right chest wall. No vomiting, numbness, weakness. Pain in her chest is worse with deep breathing. She did have a lot of coughing initially, now resolved. She is on chronic pain medications and did take an extra oxycodone 10 yesterday for her pain. Past Medical History:  Diagnosis Date  . Anemia    low iron  . Asthma   . Bipolar disorder ([place])   . Chronic back pain   . DDD (degenerative disc disease)   . Depression   . Dyspnea   . H/O alcohol abuse    28 years sober  . H/O suicide attempt   . Herpes   . Migraines   . Osteoporosis 11/2013   T score -2.6 AP spine. 1 dose Reclast with reaction Prolia since 10/2011 most recent DEXA shows significant improvement at all measured sites.  . Pneumonia   . S/P adenoidectomy   . S/P tonsillectomy     Patient Active Problem List   Diagnosis Date Noted  . Pain 07/26/2015  . Anxiety state 06/28/2015  . Benzodiazepine dependence ([place]) 10/13/2014  . Opioid abuse with opioid-induced mood disorder ([place]) 10/13/2014  . Bipolar 1 disorder, depressed ([place]) 10/12/2014  . Spondylolisthesis of lumbar region 07/28/2014  . Osteoporosis, unspecified 11/17/2013  . Migraines   . Herpes   . HYPOTHYROIDISM 08/13/2008  . HYPERCHOLESTEROLEMIA 08/13/2008  . BIPOLAR  DISORDER UNSPECIFIED 08/13/2008  . CHRONIC OBSTRUCTIVE ASTHMA UNSPECIFIED 08/13/2008  . GASTROESOPHAGEAL REFLUX DISEASE 08/13/2008  . HIATAL HERNIA 08/13/2008  . ALLERGY 08/13/2008    Past Surgical History:  Procedure Laterality Date  . CESAREAN SECTION    . COLONOSCOPY     as a teenager  . Compression fracture spine    . DILATION AND CURETTAGE OF UTERUS    . EYE SURGERY Bilateral    CATARACTS REMOVED, NO LENS PLACED  . HYSTEROSCOPY    . KYPHOPLASTY N/A 12/19/2016   Procedure: THORACIC 12 KYPHOPLASTY; REQUEST 60 MINS AND FLIP ROOM;  Surgeon: Phylliss Bob, MD;  Location: [facility];  Service: Orthopedics;  Laterality: N/A;  THORACIC 12 KYPHOPLASTY; REQUEST 60 MINS AND FLIP ROOM  . LUMBAR FUSION    . TONSILLECTOMY       OB History    Gravida  3   Para  1   Term  1   Preterm      AB  2   Living  1     SAB      TAB      Ectopic      Multiple      Live Births               Home Medications    Prior to Admission medications   Medication Sig Start Date  End Date Taking? Authorizing Provider  acyclovir ointment (ZOVIRAX) 5 % Apply topically 3 (three) times daily. As needed. 05/01/17   [name], NP  albuterol (PROVENTIL HFA;VENTOLIN HFA) 108 (90 Base) MCG/ACT inhaler Inhale 2 puffs into the lungs every 6 (six) hours as needed for wheezing or shortness of breath.    [provider]  ARNUITY ELLIPTA 200 MCG/ACT AEPB Inhale 2 puffs into the lungs 2 (two) times daily as needed (SHORTNESS OF BREATH).  09/21/16   [provider]  buPROPion (WELLBUTRIN XL) 150 MG 24 hr tablet Take 150 mg by mouth daily. 10/01/16   [provider]  calcium citrate-vitamin D (CALCIUM + D) 315-200 MG-UNIT per tablet Take 1 tablet by mouth daily. For bone health 10/18/14   Lindell Spar I, NP  cyclobenzaprine (FLEXERIL) 10 MG tablet Take 10 mg by mouth 2 (two) times daily as needed for muscle spasms.    [provider]  denosumab (PROLIA) 60 MG/ML SOLN  injection Inject 60 mg into the skin every 6 (six) months. Administer in upper arm, thigh, or abdomen  NEXT IS DUE 03-2017 03/05/17   [name], NP  FLUoxetine (PROZAC) 20 MG capsule Take 20 mg by mouth 2 (two) times daily.     [provider]  gabapentin (NEURONTIN) 600 MG tablet Take 600 mg by mouth 3 (three) times daily as needed (PAIN).  11/05/16   [provider]  levothyroxine (SYNTHROID, LEVOTHROID) 100 MCG tablet Take 100 mcg by mouth daily before breakfast.  11/06/16   [provider]  levothyroxine (SYNTHROID, LEVOTHROID) 112 MCG tablet TAKE 1 TABLET BY MOUTH EVERY DAY ON EMPTY STOMACH IN THE MORNING 09/04/17   [provider]  lidocaine (LIDODERM) 5 % Place 1 patch onto the skin daily. Remove & Discard patch within 12 hours or as directed by MD 09/20/17   Quintella Reichert, MD  methocarbamol (ROBAXIN) 500 MG tablet TAKE 1 TABLET BY MOUTH 3 TIMES DAILY FOR 30 DAYS 08/27/17   [provider]  Multiple Vitamin (MULTIVITAMIN) tablet Take 1 tablet by mouth daily. For low vitamin 10/18/14   Lindell Spar I, NP  ondansetron (ZOFRAN) 8 MG tablet Take 8 mg by mouth every 8 (eight) hours as needed for nausea or vomiting (Migraine Head Ache symptoms).    [provider]  oxcarbazepine (TRILEPTAL) 600 MG tablet Take 600 mg by mouth 2 (two) times daily.     [provider]  OxyCODONE ER (XTAMPZA ER) 13.5 MG C12A Take 13.5 mg by mouth 2 (two) times daily. 11/22/16   [provider]  Oxycodone HCl 10 MG TABS TAKE 1 TABLET EVERY DAY AS NEEDED FOR UP TO 30 DAYS 09/16/17   [provider]  QUEtiapine (SEROQUEL) 100 MG tablet Take 200 mg by mouth at bedtime. 10/12/16   [provider]  ranitidine (ZANTAC) 300 MG tablet Take 300 mg by mouth at bedtime. 10/26/14   [provider]  rizatriptan (MAXALT-MLT) 10 MG disintegrating tablet Take 10 mg by mouth as needed for migraine.  11/08/14   [provider]  valACYclovir  (VALTREX) 500 MG tablet Take one tablet twice daily 3-5 days then daily as needed. 05/01/17   [name], NP    Family History Family History  Adopted: Yes    Social History Social History   Tobacco Use  . Smoking status: Never Smoker  . Smokeless tobacco: Never Used  Substance Use Topics  . Alcohol use: No    Alcohol/week:  0.0 oz    Comment: hx of heavy alcohol use - 28 years sober  . Drug use: Yes    Types: Benzodiazepines    Comment: hx of cocaine use in her 20's     Allergies   Prednisone; Lithium carbonate; and Sulfa antibiotics   Review of Systems Review of Systems  All other systems reviewed and are negative.    Physical Exam Updated Vital Signs BP (!) 156/108 (BP Location: Left Arm)   Pulse 88   Temp 98.7 F (37.1 C) (Oral)   Resp 18   SpO2 96%   Physical Exam  Constitutional: She is oriented to person, place, and time. She appears well-developed and well-nourished.  HENT:  Head: Normocephalic and atraumatic.  Cardiovascular: Normal rate and regular rhythm.  No murmur heard. Pulmonary/Chest:  U-shaped area of ecchymosis under the right breast. There is tenderness to palpation under the right breast.  Abdominal: Soft. There is no tenderness. There is no rebound and no guarding.  Musculoskeletal: She exhibits no edema or tenderness.  Neurological: She is alert and oriented to person, place, and time.  Skin: Skin is warm and dry.  Psychiatric: She has a normal mood and affect. Her behavior is normal.  Nursing note and vitals reviewed.    ED Treatments / Results  Labs (all labs ordered are listed, but only abnormal results are displayed) Labs Reviewed - No data to display  EKG None  Radiology Dg Chest 2 View  Result Date: 09/20/2017 CLINICAL DATA:  Fall with right rib cage pain EXAM: CHEST - 2 VIEW COMPARISON:  04/19/2017 FINDINGS: Low volume chest with streaky opacities at the bases. No visible acute rib fracture. A lateral right fourth  rib deformity is chronic. Remote L1 compression fracture with cement augmentation. Borderline heart size, accentuated by low volumes. No effusion or pneumothorax. Moderate or large hiatal hernia. IMPRESSION: 1. Low volume chest with atelectatic type opacities. 2. No visible acute rib fracture. 3. Hiatal hernia. Electronically Signed   By: Monte Fantasia M.D.   On: 09/20/2017 14:57    Procedures Procedures (including critical care time)  Medications Ordered in ED Medications  oxyCODONE (Oxy IR/ROXICODONE) immediate release tablet 10 mg (has no administration in time range)  lidocaine (LIDODERM) 5 % 1 patch (has no administration in time range)  naproxen (NAPROSYN) tablet 250 mg (has no administration in time range)     Initial Impression / Assessment and Plan / ED Course  I have reviewed the triage vital signs and the nursing notes.  Pertinent labs & imaging results that were available during my care of the patient were reviewed by me and considered in my medical decision making (see chart for details).     Patient here for evaluation of injuries following a fall two days ago. She did hit her head but she is neurovascular intact at this time, she is not anticoagulated. Do not feel head imaging is warranted at this time. She does have pain to her right chest wall with a small amount of local ecchymosis and tenderness. No evidence of pneumothorax or rib fracture on imaging. Discussed with patient home care for chest wall contusion. She has minimal splinting in the emergency department. Discussed continuing her current chronic pain medications as well as adding aleve BID. Will prescribed Lidoderm patch. Discussed outpatient follow-up and return precautions.  Final Clinical Impressions(s) / ED Diagnoses   Final diagnoses:  Fall, initial encounter  Contusion of right chest wall, initial encounter    ED Discharge  Orders        Ordered    lidocaine (LIDODERM) 5 %  Every 24 hours      09/20/17 1724       Quintella Reichert, MD 09/20/17 1729

## 2017-09-20 NOTE — ED Triage Notes (Signed)
Pt complaint of right ribcage pain post fall on Wednesday.

## 2017-09-26 NOTE — Telephone Encounter (Signed)
Appt scheduled for 10/16/17 at 2:30

## 2017-10-01 DIAGNOSIS — S20219A Contusion of unspecified front wall of thorax, initial encounter: Secondary | ICD-10-CM | POA: Diagnosis not present

## 2017-10-16 ENCOUNTER — Ambulatory Visit: Payer: Medicare HMO

## 2017-10-16 NOTE — Telephone Encounter (Signed)
Pt left voice mail stating she had a fall. She is cancelling her Prolia appt scheduled for today.  In here message she states she will call us back when shes ready to proceed with Prolia

## 2017-10-16 NOTE — Telephone Encounter (Signed)
Spoke with pt as follow up to confirm she will call us back as well as seeing if she ok since the fall. Pt states she has contacted her other Physician to see her pertaining to the fall.   Will close encounter and wait on patient to give me a call to schedule Prolia

## 2017-10-17 DIAGNOSIS — F4310 Post-traumatic stress disorder, unspecified: Secondary | ICD-10-CM | POA: Diagnosis not present

## 2017-10-17 DIAGNOSIS — F319 Bipolar disorder, unspecified: Secondary | ICD-10-CM | POA: Diagnosis not present

## 2017-10-17 DIAGNOSIS — F441 Dissociative fugue: Secondary | ICD-10-CM | POA: Diagnosis not present

## 2017-10-18 ENCOUNTER — Encounter: Payer: Self-pay | Admitting: Gynecology

## 2017-10-18 DIAGNOSIS — M545 Low back pain: Secondary | ICD-10-CM | POA: Diagnosis not present

## 2017-10-25 ENCOUNTER — Encounter (HOSPITAL_COMMUNITY): Payer: Self-pay | Admitting: *Deleted

## 2017-10-25 ENCOUNTER — Other Ambulatory Visit: Payer: Self-pay

## 2017-10-25 ENCOUNTER — Emergency Department (HOSPITAL_COMMUNITY)
Admission: EM | Admit: 2017-10-25 | Discharge: 2017-10-26 | Disposition: A | Discharge status: Home / Self Care | Payer: Medicare HMO | Attending: Emergency Medicine | Admitting: Emergency Medicine

## 2017-10-25 ENCOUNTER — Emergency Department (HOSPITAL_COMMUNITY): Payer: Medicare HMO

## 2017-10-25 DIAGNOSIS — F419 Anxiety disorder, unspecified: Secondary | ICD-10-CM | POA: Diagnosis not present

## 2017-10-25 DIAGNOSIS — F319 Bipolar disorder, unspecified: Secondary | ICD-10-CM | POA: Diagnosis not present

## 2017-10-25 DIAGNOSIS — F4321 Adjustment disorder with depressed mood: Secondary | ICD-10-CM | POA: Diagnosis not present

## 2017-10-25 DIAGNOSIS — F1010 Alcohol abuse, uncomplicated: Secondary | ICD-10-CM | POA: Diagnosis not present

## 2017-10-25 DIAGNOSIS — F1110 Opioid abuse, uncomplicated: Secondary | ICD-10-CM | POA: Insufficient documentation

## 2017-10-25 DIAGNOSIS — E039 Hypothyroidism, unspecified: Secondary | ICD-10-CM | POA: Diagnosis not present

## 2017-10-25 DIAGNOSIS — M545 Low back pain, unspecified: Secondary | ICD-10-CM

## 2017-10-25 DIAGNOSIS — J45909 Unspecified asthma, uncomplicated: Secondary | ICD-10-CM | POA: Insufficient documentation

## 2017-10-25 DIAGNOSIS — M549 Dorsalgia, unspecified: Secondary | ICD-10-CM | POA: Diagnosis present

## 2017-10-25 DIAGNOSIS — Z79899 Other long term (current) drug therapy: Secondary | ICD-10-CM | POA: Insufficient documentation

## 2017-10-25 LAB — CBC WITH DIFFERENTIAL/PLATELET
Abs Immature Granulocytes: 0 10*3/uL (ref 0.0–0.1)
Basophils Absolute: 0.1 10*3/uL (ref 0.0–0.1)
Basophils Relative: 1 %
Eosinophils Absolute: 0.4 10*3/uL (ref 0.0–0.7)
Eosinophils Relative: 4 %
HCT: 46 % (ref 36.0–46.0)
Hemoglobin: 14.7 g/dL (ref 12.0–15.0)
Immature Granulocytes: 0 %
Lymphocytes Relative: 16 %
Lymphs Abs: 1.3 10*3/uL (ref 0.7–4.0)
MCH: 28.5 pg (ref 26.0–34.0)
MCHC: 32 g/dL (ref 30.0–36.0)
MCV: 89.1 fL (ref 78.0–100.0)
Monocytes Absolute: 0.5 10*3/uL (ref 0.1–1.0)
Monocytes Relative: 6 %
Neutro Abs: 5.8 10*3/uL (ref 1.7–7.7)
Neutrophils Relative %: 72 %
Platelets: 396 10*3/uL (ref 150–400)
RBC: 5.16 MIL/uL — ABNORMAL HIGH (ref 3.87–5.11)
RDW: 12.7 % (ref 11.5–15.5)
WBC: 8.1 10*3/uL (ref 4.0–10.5)

## 2017-10-25 LAB — COMPREHENSIVE METABOLIC PANEL
ALT: 15 U/L (ref 0–44)
AST: 24 U/L (ref 15–41)
Albumin: 3.9 g/dL (ref 3.5–5.0)
Alkaline Phosphatase: 142 U/L — ABNORMAL HIGH (ref 38–126)
Anion gap: 11 (ref 5–15)
BUN: 8 mg/dL (ref 8–23)
CO2: 23 mmol/L (ref 22–32)
Calcium: 10.5 mg/dL — ABNORMAL HIGH (ref 8.9–10.3)
Chloride: 107 mmol/L (ref 98–111)
Creatinine, Ser: 0.84 mg/dL (ref 0.44–1.00)
GFR calc Af Amer: >60 mL/min (ref >60)
GFR calc non Af Amer: >60 mL/min (ref >60)
Glucose, Bld: 113 mg/dL — ABNORMAL HIGH (ref 70–99)
Potassium: 4.5 mmol/L (ref 3.5–5.1)
Sodium: 141 mmol/L (ref 135–145)
Total Bilirubin: 0.6 mg/dL (ref 0.3–1.2)
Total Protein: 7.2 g/dL (ref 6.5–8.1)

## 2017-10-25 LAB — URINALYSIS, ROUTINE W REFLEX MICROSCOPIC
Bilirubin Urine: NEGATIVE
Glucose, UA: NEGATIVE mg/dL
Ketones, ur: 5 mg/dL — AB
Nitrite: NEGATIVE
Protein, ur: NEGATIVE mg/dL
Specific Gravity, Urine: 1.009 (ref 1.005–1.030)
pH: 8 (ref 5.0–8.0)

## 2017-10-25 LAB — RAPID URINE DRUG SCREEN, HOSP PERFORMED
Amphetamines: NOT DETECTED
Barbiturates: NOT DETECTED
Benzodiazepines: POSITIVE — AB
Cocaine: NOT DETECTED
Opiates: NOT DETECTED
Tetrahydrocannabinol: POSITIVE — AB

## 2017-10-25 LAB — ETHANOL: Alcohol, Ethyl (B): <10 mg/dL (ref <10)

## 2017-10-25 MED ORDER — SUMATRIPTAN SUCCINATE 100 MG PO TABS
100.0000 mg | ORAL_TABLET | Freq: Two times a day (BID) | ORAL | Status: DC | PRN
  Filled 2017-10-25: qty 1

## 2017-10-25 MED ORDER — CALCIUM CARBONATE-VITAMIN D 500-200 MG-UNIT PO TABS: 1.0000 | ORAL_TABLET | Freq: Every day | ORAL | Status: DC

## 2017-10-25 MED ORDER — METHOCARBAMOL 500 MG PO TABS: 500.0000 mg | ORAL_TABLET | Freq: Three times a day (TID) | ORAL | Status: DC | PRN

## 2017-10-25 MED ORDER — OXYCODONE HCL 5 MG PO TABS
10.0000 mg | ORAL_TABLET | Freq: Every day | ORAL | Status: DC | PRN
  Administered 2017-10-26: 10 mg via ORAL
  Filled 2017-10-25: qty 2

## 2017-10-25 MED ORDER — ONDANSETRON HCL 4 MG PO TABS
8.0000 mg | ORAL_TABLET | Freq: Three times a day (TID) | ORAL | Status: DC | PRN
Start: 2017-10-25 — End: 2017-10-26

## 2017-10-25 MED ORDER — SODIUM CHLORIDE 0.9 % IV BOLUS
1000.0000 mL | Freq: Once | INTRAVENOUS | Status: AC
  Administered 2017-10-25: 1000 mL via INTRAVENOUS

## 2017-10-25 MED ORDER — ALBUTEROL SULFATE HFA 108 (90 BASE) MCG/ACT IN AERS: 2.0000 | INHALATION_SPRAY | Freq: Four times a day (QID) | RESPIRATORY_TRACT | Status: DC | PRN

## 2017-10-25 MED ORDER — LORAZEPAM 2 MG/ML IJ SOLN
1.0000 mg | Freq: Once | INTRAMUSCULAR | Status: AC
  Administered 2017-10-25: 1 mg via INTRAVENOUS
  Filled 2017-10-25: qty 1

## 2017-10-25 MED ORDER — OXCARBAZEPINE 300 MG PO TABS: 1200.0000 mg | ORAL_TABLET | Freq: Every day | ORAL | Status: DC

## 2017-10-25 MED ORDER — GABAPENTIN 600 MG PO TABS
600.0000 mg | ORAL_TABLET | Freq: Three times a day (TID) | ORAL | Status: DC | PRN
  Filled 2017-10-25: qty 1

## 2017-10-25 MED ORDER — OXYCODONE ER 13.5 MG PO C12A: 13.5000 mg | EXTENDED_RELEASE_CAPSULE | Freq: Two times a day (BID) | ORAL | Status: DC

## 2017-10-25 MED ORDER — ADULT MULTIVITAMIN W/MINERALS CH: 1.0000 | ORAL_TABLET | Freq: Every day | ORAL | Status: DC

## 2017-10-25 MED ORDER — QUETIAPINE FUMARATE 200 MG PO TABS
200.0000 mg | ORAL_TABLET | Freq: Every day | ORAL | Status: DC
  Filled 2017-10-25: qty 1

## 2017-10-25 MED ORDER — LEVOTHYROXINE SODIUM 112 MCG PO TABS
112.0000 ug | ORAL_TABLET | Freq: Every day | ORAL | Status: DC
  Filled 2017-10-25: qty 1

## 2017-10-25 MED ORDER — LIDOCAINE 5 % EX PTCH: 1.0000 | MEDICATED_PATCH | CUTANEOUS | Status: DC

## 2017-10-25 MED ORDER — FAMOTIDINE 20 MG PO TABS: 20.0000 mg | ORAL_TABLET | Freq: Two times a day (BID) | ORAL | Status: DC

## 2017-10-25 MED ORDER — BUPROPION HCL ER (XL) 150 MG PO TB24: 150.0000 mg | ORAL_TABLET | Freq: Every day | ORAL | Status: DC

## 2017-10-25 MED ORDER — LORAZEPAM 2 MG/ML IJ SOLN
1.0000 mg | Freq: Once | INTRAMUSCULAR | Status: DC
  Filled 2017-10-25: qty 1

## 2017-10-25 MED ORDER — NAPROXEN 250 MG PO TABS: 250.0000 mg | ORAL_TABLET | Freq: Two times a day (BID) | ORAL | Status: DC | PRN

## 2017-10-25 MED ORDER — FLUOXETINE HCL 20 MG PO CAPS: 40.0000 mg | ORAL_CAPSULE | Freq: Every day | ORAL | Status: DC

## 2017-10-25 NOTE — ED Notes (Signed)
Pt crying intermittently  Orders placed by dr Regenia Skeeter not to perform until  After her ri

## 2017-10-25 NOTE — ED Notes (Signed)
The pt keeps  Pulling off her bp cuff and her pulse ox

## 2017-10-25 NOTE — ED Notes (Signed)
Assisted patient with purewick, voiding at this time

## 2017-10-25 NOTE — ED Triage Notes (Signed)
Pt sent here by [facility] for lumbar fracture.  Pt c/o pain and swelling after recent falls.  Fracture noted today on x-ray obtained in ortho office.  Denies loss of control of bladder or bowel function.
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
## 2017-10-25 NOTE — ED Notes (Signed)
TTS in progress 

## 2017-10-25 NOTE — ED Notes (Signed)
Pt was waiting in blue chairs for triage, reported she felt faint. Pt had slid out of chair onto floor. Unwitnessed. This tech, Luther Redo, EMT, and Event organiser, RN transferred pt into wheelchair and checked vitals.

## 2017-10-25 NOTE — ED Provider Notes (Signed)
[facility] EMERGENCY DEPARTMENT Provider Note   CSN: [id number] Arrival date & time: 10/25/17  1512     History   Chief Complaint Chief Complaint  Patient presents with  . Back Pain    lumbar fracture    HPI Victoria Levine is a 67 y.o. female.  HPI  67 year old female with history of chronic back pain status post kyphoplasty as well as chronic pain requiring daily opioids presents with L2 fracture from her orthopedist office.  Her orthopedist at [facility], Dr. Lynann Bologna, sent her into the ER.  She states that for the past week or so she is been having severe back pain without a known injury.  An x-ray obtained in their office shows a new L2 compression fracture last week.  She has tried twice to get an MRI that they ordered but both times is been unable to.  Yesterday she had a near syncopal episode stating that she felt weak and lightheaded and fell to the ground and had to be picked up by neighbors.  When she went back to the office today as scheduled they sent her here for admission due to the severe pain.  She has not been having any fevers.  No weakness or numbness in her extremities.  She states one time she did not make it to the bathroom in time to urinate but she did feel the urge to urinate at that point.  She is on extended release oxycodone which she has been taking but has recently run out of her 30 tablets of 10 mg oxycodone that she filled 1 week ago.  She is been taking extra due to the degree of pain.  Past Medical History:  Diagnosis Date  . Anemia    low iron  . Asthma   . Bipolar disorder ([place])   . Chronic back pain   . DDD (degenerative disc disease)   . Depression   . Dyspnea   . H/O alcohol abuse    28 years sober  . H/O suicide attempt   . Herpes   . Migraines   . Osteoporosis 11/2013   T score -2.6 AP spine. 1 dose Reclast with reaction Prolia since 10/2011 most recent DEXA shows significant improvement at all measured  sites.  . Pneumonia   . S/P adenoidectomy   . S/P tonsillectomy     Patient Active Problem List   Diagnosis Date Noted  . Pain 07/26/2015  . Anxiety state 06/28/2015  . Benzodiazepine dependence ([place]) 10/13/2014  . Opioid abuse with opioid-induced mood disorder ([place]) 10/13/2014  . Bipolar 1 disorder, depressed ([place]) 10/12/2014  . Spondylolisthesis of lumbar region 07/28/2014  . Osteoporosis, unspecified 11/17/2013  . Migraines   . Herpes   . HYPOTHYROIDISM 08/13/2008  . HYPERCHOLESTEROLEMIA 08/13/2008  . BIPOLAR DISORDER UNSPECIFIED 08/13/2008  . CHRONIC OBSTRUCTIVE ASTHMA UNSPECIFIED 08/13/2008  . GASTROESOPHAGEAL REFLUX DISEASE 08/13/2008  . HIATAL HERNIA 08/13/2008  . ALLERGY 08/13/2008    Past Surgical History:  Procedure Laterality Date  . CESAREAN SECTION    . COLONOSCOPY     as a teenager  . Compression fracture spine    . DILATION AND CURETTAGE OF UTERUS    . EYE SURGERY Bilateral    CATARACTS REMOVED, NO LENS PLACED  . HYSTEROSCOPY    . KYPHOPLASTY N/A 12/19/2016   Procedure: THORACIC 12 KYPHOPLASTY; REQUEST 60 MINS AND FLIP ROOM;  Surgeon: Phylliss Bob, MD;  Location: [facility];  Service: Orthopedics;  Laterality: N/A;  THORACIC  12 KYPHOPLASTY; REQUEST 60 MINS AND FLIP ROOM  . LUMBAR FUSION    . TONSILLECTOMY       OB History    Gravida  3   Para  1   Term  1   Preterm      AB  2   Living  1     SAB      TAB      Ectopic      Multiple      Live Births               Home Medications    Prior to Admission medications   Medication Sig Start Date End Date Taking? Authorizing Provider  albuterol (PROVENTIL HFA;VENTOLIN HFA) 108 (90 Base) MCG/ACT inhaler Inhale 2 puffs into the lungs every 6 (six) hours as needed for wheezing or shortness of breath.   Yes [provider]  buPROPion (WELLBUTRIN XL) 150 MG 24 hr tablet Take 150 mg by mouth daily. 10/01/16  Yes [provider]  calcium citrate-vitamin D (CALCIUM + D) 315-200  MG-UNIT per tablet Take 1 tablet by mouth daily. For bone health 10/18/14  Yes Lindell Spar I, NP  denosumab (PROLIA) 60 MG/ML SOLN injection Inject 60 mg into the skin every 6 (six) months. Administer in upper arm, thigh, or abdomen  NEXT IS DUE 03-2017 Patient taking differently: Inject 60 mg into the skin every 6 (six) months. Administer in upper arm, thigh, or abdomen 03/05/17  Yes Young, Candiss Norse, NP  FLUoxetine (PROZAC) 20 MG capsule Take 40 mg by mouth daily.    Yes [provider]  gabapentin (NEURONTIN) 600 MG tablet Take 600 mg by mouth 3 (three) times daily as needed (PAIN).  11/05/16  Yes [provider]  levothyroxine (SYNTHROID, LEVOTHROID) 112 MCG tablet Take 112 mcg by mouth daily before breakfast.  09/04/17  Yes [provider]  methocarbamol (ROBAXIN) 500 MG tablet Take 500 mg by mouth 3 (three) times daily as needed for muscle spasms.  08/27/17  Yes [provider]  Multiple Vitamins-Minerals (ADULT GUMMY) CHEW Chew 1 tablet by mouth daily.   Yes [provider]  naproxen sodium (ALEVE) 220 MG tablet Take 220-440 mg by mouth 2 (two) times daily as needed (for pain).   Yes [provider]  Oxycodone HCl 10 MG TABS Take 10 mg by mouth daily as needed (for breakthrough pain).  09/16/17  Yes [provider]  acyclovir ointment (ZOVIRAX) 5 % Apply topically 3 (three) times daily. As needed. Patient not taking: Reported on 10/25/2017 05/01/17   [name], NP  Multiple Vitamin (MULTIVITAMIN) tablet Take 1 tablet by mouth daily. For low vitamin Patient not taking: Reported on 10/25/2017 10/18/14   Lindell Spar I, NP  ondansetron (ZOFRAN) 8 MG tablet Take 8 mg by mouth every 8 (eight) hours as needed for nausea or vomiting (Migraine Head Ache symptoms).    [provider]  oxcarbazepine (TRILEPTAL) 600 MG tablet Take 1,200 mg by mouth daily.     [provider]  OxyCODONE ER (XTAMPZA ER) 13.5 MG C12A Take 13.5 mg by  mouth 2 (two) times daily. 11/22/16   [provider]  QUEtiapine (SEROQUEL) 100 MG tablet Take 200 mg by mouth at bedtime. 10/12/16   [provider]  ranitidine (ZANTAC) 300 MG tablet Take 300 mg by mouth at bedtime. 10/26/14   [provider]  rizatriptan (MAXALT-MLT) 10 MG disintegrating tablet Take 10 mg by mouth as  needed for migraine.  11/08/14   [provider]  valACYclovir (VALTREX) 500 MG tablet Take one tablet twice daily 3-5 days then daily as needed. Patient taking differently: Take one tablet twice daily 3-5 days then daily as needed for outbreaks. 05/01/17   [name], NP    Family History Family History  Adopted: Yes    Social History Social History   Tobacco Use  . Smoking status: Never Smoker  . Smokeless tobacco: Never Used  Substance Use Topics  . Alcohol use: No    Alcohol/week: 0.0 standard drinks    Comment: hx of heavy alcohol use - 28 years sober  . Drug use: Yes    Types: Benzodiazepines    Comment: hx of cocaine use in her 20's     Allergies   Prednisone; Lithium carbonate; and Sulfa antibiotics   Review of Systems Review of Systems  Constitutional: Negative for fever.  Gastrointestinal: Negative for abdominal pain.  Musculoskeletal: Positive for back pain.  Neurological: Negative for weakness and numbness.  All other systems reviewed and are negative.    Physical Exam Updated Vital Signs BP (!) 140/116   Pulse 86   Temp 98.4 F (36.9 C) (Oral)   Resp 16   Ht 5\' 4"  (1.626 m)   Wt 78 kg   SpO2 100%   BMI 29.52 kg/m   Physical Exam  Constitutional: She is oriented to person, place, and time. She appears well-developed and well-nourished. No distress.  Laying on right side  HENT:  Head: Normocephalic and atraumatic.  Right Ear: External ear normal.  Left Ear: External ear normal.  Nose: Nose normal.  Eyes: Right eye exhibits no discharge. Left eye exhibits no discharge.  Cardiovascular: Normal  rate, regular rhythm and normal heart sounds.  Pulmonary/Chest: Effort normal and breath sounds normal.  Abdominal: Soft. She exhibits no distension. There is no tenderness.  Musculoskeletal:       Thoracic back: She exhibits no tenderness.       Lumbar back: She exhibits tenderness.  Neurological: She is alert and oriented to person, place, and time.  5/5 strength in BLE. Normal gross sensation  Skin: Skin is warm and dry. She is not diaphoretic.  Nursing note and vitals reviewed.    ED Treatments / Results  Labs (all labs ordered are listed, but only abnormal results are displayed) Labs Reviewed  URINALYSIS, ROUTINE W REFLEX MICROSCOPIC - Abnormal; Notable for the following components:      Result Value   APPearance HAZY (*)    Hgb urine dipstick SMALL (*)    Ketones, ur 5 (*)    Leukocytes, UA TRACE (*)    Bacteria, UA RARE (*)    All other components within normal limits  COMPREHENSIVE METABOLIC PANEL - Abnormal; Notable for the following components:   Glucose, Bld 113 (*)    Calcium 10.5 (*)    Alkaline Phosphatase 142 (*)    All other components within normal limits  CBC WITH DIFFERENTIAL/PLATELET - Abnormal; Notable for the following components:   RBC 5.16 (*)    All other components within normal limits  RAPID URINE DRUG SCREEN, HOSP PERFORMED - Abnormal; Notable for the following components:   Benzodiazepines POSITIVE (*)    Tetrahydrocannabinol POSITIVE (*)    All other components within normal limits  ETHANOL    EKG EKG Interpretation  Date/Time:  Friday October 25 2017 17:33:09 EDT Ventricular Rate:  95 PR Interval:    QRS Duration: 83 QT  Interval:  350 QTC Calculation: 440 R Axis:   80 Text Interpretation:  Age not entered, assumed to be  67 years old for purpose of ECG interpretation Sinus rhythm Anteroseptal infarct, age indeterminate no significant change since Sept 2018 Confirmed by Sherwood Gambler [phone]) on 10/25/2017 5:49:11 PM   Radiology No  results found.  Procedures Procedures (including critical care time)  Medications Ordered in ED Medications  albuterol (PROVENTIL HFA;VENTOLIN HFA) 108 (90 Base) MCG/ACT inhaler 2 puff (has no administration in time range)  buPROPion (WELLBUTRIN XL) 24 hr tablet 150 mg (has no administration in time range)  calcium-vitamin D (OSCAL WITH D) 500-200 MG-UNIT per tablet 1 tablet (has no administration in time range)  FLUoxetine (PROZAC) capsule 40 mg (has no administration in time range)  gabapentin (NEURONTIN) tablet 600 mg (has no administration in time range)  levothyroxine (SYNTHROID, LEVOTHROID) tablet 112 mcg (has no administration in time range)  lidocaine (LIDODERM) 5 % 1 patch (has no administration in time range)  methocarbamol (ROBAXIN) tablet 500 mg (has no administration in time range)  multivitamin with minerals tablet 1 tablet (has no administration in time range)  naproxen (NAPROSYN) tablet 250 mg (has no administration in time range)  ondansetron (ZOFRAN) tablet 8 mg (has no administration in time range)  Oxcarbazepine (TRILEPTAL) tablet 1,200 mg (has no administration in time range)  oxyCODONE (Oxy IR/ROXICODONE) immediate release tablet 10 mg (has no administration in time range)  QUEtiapine (SEROQUEL) tablet 200 mg (has no administration in time range)  famotidine (PEPCID) tablet 20 mg (has no administration in time range)  SUMAtriptan (IMITREX) tablet 100 mg (has no administration in time range)  LORazepam (ATIVAN) injection 1 mg (has no administration in time range)  sodium chloride 0.9 % bolus 1,000 mL (0 mLs Intravenous Stopped 10/25/17 1919)  LORazepam (ATIVAN) injection 1 mg (1 mg Intravenous Given 10/25/17 2250)     Initial Impression / Assessment and Plan / ED Course  I have reviewed the triage vital signs and the nursing notes.  Pertinent labs & imaging results that were available during my care of the patient were reviewed by me and considered in my medical  decision making (see chart for details).  Clinical Course as of Oct 27 35  Fri Oct 25, 2017  1739 Cussed with Dr. Berenice Primas, [facility], who reviewed her clinic note.  She was sent over to the ED due to concern for mania.  She was acting erratic in the office.  From a orthopedic standpoint, and lumbar spine MRI would be good to help rule out an acute surgical problem but it is most likely this can be an outpatient procedure.  The brother at the bedside notes that she has been acting erratic and this is been more since she is been having the increased pain.  She is also had some vague suicidal thoughts mentioned.   [SG]    Clinical Course User Index [SG] Sherwood Gambler, MD    Patient appears to have bipolar disorder and likely is somewhat decompensated but is not acutely manic or a threat to herself.  Has for her severe back pain, she seems to be resting comfortably now and her lumbar MRI spine was ordered.  However she was unable to complete this despite multiple doses of Ativan.  We discussed how she will probably need an outpatient MRI but if she is able to walk she will not needed emergently.  We are currently in the process of attempting to walk the patient but  she is having some difficulty.  Thus we will try some better pain control and try to re-walk.  If able to walk I think she can be discharged home to follow-up with orthopedics.  Care to Dr. Venora Maples.  Final Clinical Impressions(s) / ED Diagnoses   Final diagnoses:  None    ED Discharge Orders    None       Sherwood Gambler, MD 10/26/17 [phone]

## 2017-10-25 NOTE — ED Notes (Signed)
To mri 

## 2017-10-26 DIAGNOSIS — M545 Low back pain: Secondary | ICD-10-CM | POA: Diagnosis not present

## 2017-10-26 MED ORDER — METHOCARBAMOL 500 MG PO TABS
500.0000 mg | ORAL_TABLET | Freq: Once | ORAL | Status: AC
  Administered 2017-10-26: 500 mg via ORAL
  Filled 2017-10-26: qty 1

## 2017-10-26 MED ORDER — FENTANYL CITRATE (PF) 100 MCG/2ML IJ SOLN: 50.0000 ug | Freq: Once | INTRAMUSCULAR | Status: DC

## 2017-10-26 MED ORDER — GABAPENTIN 300 MG PO CAPS
600.0000 mg | ORAL_CAPSULE | Freq: Once | ORAL | Status: AC
  Administered 2017-10-26: 600 mg via ORAL
  Filled 2017-10-26: qty 2

## 2017-10-26 MED ORDER — KETOROLAC TROMETHAMINE 30 MG/ML IJ SOLN
30.0000 mg | Freq: Once | INTRAMUSCULAR | Status: AC
  Administered 2017-10-26: 30 mg via INTRAVENOUS
  Filled 2017-10-26: qty 1

## 2017-10-26 NOTE — ED Notes (Signed)
Pt in mri  Would not stay in the ri room any excuses for not getting the ri crying  Reporting that the nri made her cough.  Coughed only once ion the an y hours in the ed.  Dr Regenia Skeeter notified of her rfefusal

## 2017-10-26 NOTE — ED Notes (Signed)
PT was not able to sit up for ambulation.

## 2017-10-26 NOTE — ED Provider Notes (Signed)
Patient's pain treated with Toradol, oxycodone, Robaxin, Neurontin.  2:20 AM She feels much better at this time.  She was able to sit on the side of the bed without difficulty.  She is able to ambulate in the emergency department with her walker without difficulty.  Patient stable for discharge and does not need admission in the hospital at this time  Unfortunately she was unable to tolerate her MRI secondary to claustrophobia despite Ativan x2.  She may benefit from open MRI versus anesthesia induced sedation for further evaluation.  This can be performed as an outpatient given her ambulatory status.   Jola Schmidt, MD 10/26/17 [phone]

## 2017-10-26 NOTE — Discharge Instructions (Addendum)
Follow up with your orthopedic team.   Return to the ER for new or worsening symptoms

## 2017-10-26 NOTE — ED Notes (Signed)
The pt is laughing now after the percocet

## 2017-11-06 ENCOUNTER — Other Ambulatory Visit: Payer: Self-pay | Admitting: Orthopedic Surgery

## 2017-11-06 DIAGNOSIS — M545 Low back pain, unspecified: Secondary | ICD-10-CM

## 2017-11-13 DIAGNOSIS — G894 Chronic pain syndrome: Secondary | ICD-10-CM | POA: Diagnosis not present

## 2017-11-13 DIAGNOSIS — Z888 Allergy status to other drugs, medicaments and biological substances status: Secondary | ICD-10-CM | POA: Diagnosis not present

## 2017-11-13 DIAGNOSIS — Z9989 Dependence on other enabling machines and devices: Secondary | ICD-10-CM | POA: Diagnosis not present

## 2017-11-13 DIAGNOSIS — Z79899 Other long term (current) drug therapy: Secondary | ICD-10-CM | POA: Diagnosis not present

## 2017-11-13 DIAGNOSIS — Z882 Allergy status to sulfonamides status: Secondary | ICD-10-CM | POA: Diagnosis not present

## 2017-11-13 DIAGNOSIS — F1190 Opioid use, unspecified, uncomplicated: Secondary | ICD-10-CM | POA: Diagnosis not present

## 2017-11-16 ENCOUNTER — Ambulatory Visit
Admission: RE | Admit: 2017-11-16 | Discharge: 2017-11-16 | Disposition: A | Discharge status: Home / Self Care | Payer: Medicare HMO | Source: Ambulatory Visit | Attending: Orthopedic Surgery | Admitting: Orthopedic Surgery

## 2017-11-16 DIAGNOSIS — M545 Low back pain, unspecified: Secondary | ICD-10-CM

## 2017-11-16 DIAGNOSIS — M48061 Spinal stenosis, lumbar region without neurogenic claudication: Secondary | ICD-10-CM | POA: Diagnosis not present

## 2017-11-22 DIAGNOSIS — M545 Low back pain: Secondary | ICD-10-CM | POA: Diagnosis not present

## 2017-12-04 ENCOUNTER — Other Ambulatory Visit: Payer: Self-pay | Admitting: Orthopedic Surgery

## 2017-12-10 ENCOUNTER — Other Ambulatory Visit: Payer: Self-pay

## 2017-12-10 ENCOUNTER — Encounter (HOSPITAL_COMMUNITY): Payer: Self-pay | Admitting: *Deleted

## 2017-12-10 NOTE — Progress Notes (Signed)
Pt denies any acute cardiopulmonary issues. Pt denies being under the care of a cardiologist. Pt denies having a stress test, echo and cardiac cath. Pt stated that she does not take Aspirin. Pt made aware to stop taking vitamins, fish oil and herbal medications. Do not take any NSAIDs ie: Ibuprofen, Advil, Naproxen (Aleve), Motrin, BC and Goody Powder. Pt verbalized understanding of all pre-op instructions.

## 2017-12-11 ENCOUNTER — Ambulatory Visit (HOSPITAL_COMMUNITY): Payer: Medicare HMO | Admitting: Anesthesiology

## 2017-12-11 ENCOUNTER — Encounter (HOSPITAL_COMMUNITY)
Admission: RE | Disposition: A | Discharge status: Home / Self Care | Payer: Self-pay | Source: Ambulatory Visit | Attending: Orthopedic Surgery

## 2017-12-11 ENCOUNTER — Encounter (HOSPITAL_COMMUNITY): Payer: Self-pay | Admitting: *Deleted

## 2017-12-11 ENCOUNTER — Ambulatory Visit (HOSPITAL_COMMUNITY): Payer: Medicare HMO

## 2017-12-11 ENCOUNTER — Ambulatory Visit (HOSPITAL_COMMUNITY)
Admission: RE | Admit: 2017-12-11 | Discharge: 2017-12-11 | Disposition: A | Discharge status: Home / Self Care | Payer: Medicare HMO | Source: Ambulatory Visit | Attending: Orthopedic Surgery | Admitting: Orthopedic Surgery

## 2017-12-11 DIAGNOSIS — Z79899 Other long term (current) drug therapy: Secondary | ICD-10-CM | POA: Diagnosis not present

## 2017-12-11 DIAGNOSIS — G43909 Migraine, unspecified, not intractable, without status migrainosus: Secondary | ICD-10-CM | POA: Diagnosis not present

## 2017-12-11 DIAGNOSIS — F329 Major depressive disorder, single episode, unspecified: Secondary | ICD-10-CM | POA: Insufficient documentation

## 2017-12-11 DIAGNOSIS — K219 Gastro-esophageal reflux disease without esophagitis: Secondary | ICD-10-CM | POA: Insufficient documentation

## 2017-12-11 DIAGNOSIS — J45909 Unspecified asthma, uncomplicated: Secondary | ICD-10-CM | POA: Diagnosis not present

## 2017-12-11 DIAGNOSIS — F419 Anxiety disorder, unspecified: Secondary | ICD-10-CM | POA: Insufficient documentation

## 2017-12-11 DIAGNOSIS — M4856XA Collapsed vertebra, not elsewhere classified, lumbar region, initial encounter for fracture: Secondary | ICD-10-CM | POA: Insufficient documentation

## 2017-12-11 DIAGNOSIS — M810 Age-related osteoporosis without current pathological fracture: Secondary | ICD-10-CM | POA: Insufficient documentation

## 2017-12-11 DIAGNOSIS — S32020A Wedge compression fracture of second lumbar vertebra, initial encounter for closed fracture: Secondary | ICD-10-CM | POA: Diagnosis not present

## 2017-12-11 DIAGNOSIS — Z79891 Long term (current) use of opiate analgesic: Secondary | ICD-10-CM | POA: Insufficient documentation

## 2017-12-11 DIAGNOSIS — E039 Hypothyroidism, unspecified: Secondary | ICD-10-CM | POA: Insufficient documentation

## 2017-12-11 DIAGNOSIS — Z419 Encounter for procedure for purposes other than remedying health state, unspecified: Secondary | ICD-10-CM

## 2017-12-11 DIAGNOSIS — Z981 Arthrodesis status: Secondary | ICD-10-CM | POA: Diagnosis not present

## 2017-12-11 DIAGNOSIS — Z79890 Hormone replacement therapy (postmenopausal): Secondary | ICD-10-CM | POA: Diagnosis not present

## 2017-12-11 DIAGNOSIS — J449 Chronic obstructive pulmonary disease, unspecified: Secondary | ICD-10-CM | POA: Diagnosis not present

## 2017-12-11 DIAGNOSIS — M545 Low back pain: Secondary | ICD-10-CM | POA: Diagnosis not present

## 2017-12-11 HISTORY — DX: Anxiety disorder, unspecified: F41.9

## 2017-12-11 HISTORY — DX: Personal history of other diseases of the digestive system: Z87.19

## 2017-12-11 HISTORY — PX: KYPHOPLASTY: SHX5884

## 2017-12-11 HISTORY — DX: Wedge compression fracture of second lumbar vertebra, initial encounter for closed fracture: S32.020A

## 2017-12-11 HISTORY — DX: Hypothyroidism, unspecified: E03.9

## 2017-12-11 LAB — CBC WITH DIFFERENTIAL/PLATELET
Abs Immature Granulocytes: 0 10*3/uL (ref 0.0–0.1)
Basophils Absolute: 0 10*3/uL (ref 0.0–0.1)
Basophils Relative: 1 %
Eosinophils Absolute: 0.4 10*3/uL (ref 0.0–0.7)
Eosinophils Relative: 6 %
HCT: 43.1 % (ref 36.0–46.0)
Hemoglobin: 13.5 g/dL (ref 12.0–15.0)
Immature Granulocytes: 0 %
Lymphocytes Relative: 35 %
Lymphs Abs: 2.3 10*3/uL (ref 0.7–4.0)
MCH: 28.4 pg (ref 26.0–34.0)
MCHC: 31.3 g/dL (ref 30.0–36.0)
MCV: 90.5 fL (ref 78.0–100.0)
Monocytes Absolute: 0.5 10*3/uL (ref 0.1–1.0)
Monocytes Relative: 7 %
Neutro Abs: 3.4 10*3/uL (ref 1.7–7.7)
Neutrophils Relative %: 51 %
Platelets: 300 10*3/uL (ref 150–400)
RBC: 4.76 MIL/uL (ref 3.87–5.11)
RDW: 13.3 % (ref 11.5–15.5)
WBC: 6.6 10*3/uL (ref 4.0–10.5)

## 2017-12-11 LAB — COMPREHENSIVE METABOLIC PANEL
ALT: 44 U/L (ref 0–44)
AST: 87 U/L — ABNORMAL HIGH (ref 15–41)
Albumin: 3.2 g/dL — ABNORMAL LOW (ref 3.5–5.0)
Alkaline Phosphatase: 97 U/L (ref 38–126)
Anion gap: 11 (ref 5–15)
BUN: 6 mg/dL — ABNORMAL LOW (ref 8–23)
CO2: 25 mmol/L (ref 22–32)
Calcium: 9.7 mg/dL (ref 8.9–10.3)
Chloride: 105 mmol/L (ref 98–111)
Creatinine, Ser: 0.96 mg/dL (ref 0.44–1.00)
GFR calc Af Amer: >60 mL/min (ref >60)
GFR calc non Af Amer: 59 mL/min — ABNORMAL LOW (ref >60)
Glucose, Bld: 140 mg/dL — ABNORMAL HIGH (ref 70–99)
Potassium: 3.1 mmol/L — ABNORMAL LOW (ref 3.5–5.1)
Sodium: 141 mmol/L (ref 135–145)
Total Bilirubin: 0.5 mg/dL (ref 0.3–1.2)
Total Protein: 6.2 g/dL — ABNORMAL LOW (ref 6.5–8.1)

## 2017-12-11 LAB — URINALYSIS, MICROSCOPIC (REFLEX)

## 2017-12-11 LAB — URINALYSIS, ROUTINE W REFLEX MICROSCOPIC
Bilirubin Urine: NEGATIVE
Glucose, UA: NEGATIVE mg/dL
Hgb urine dipstick: NEGATIVE
Ketones, ur: NEGATIVE mg/dL
Nitrite: NEGATIVE
Protein, ur: NEGATIVE mg/dL
Specific Gravity, Urine: 1.02 (ref 1.005–1.030)
pH: 6 (ref 5.0–8.0)

## 2017-12-11 LAB — TYPE AND SCREEN
ABO/RH(D): AB POS
Antibody Screen: NEGATIVE

## 2017-12-11 LAB — APTT: aPTT: 28 seconds (ref 24–36)

## 2017-12-11 LAB — PROTIME-INR
INR: 1.03
Prothrombin Time: 13.4 seconds (ref 11.4–15.2)

## 2017-12-11 SURGERY — KYPHOPLASTY
Anesthesia: General

## 2017-12-11 MED ORDER — SUMATRIPTAN SUCCINATE 100 MG PO TABS
100.0000 mg | ORAL_TABLET | ORAL | Status: DC | PRN
  Filled 2017-12-11: qty 1

## 2017-12-11 MED ORDER — FAMOTIDINE 20 MG PO TABS: 20.0000 mg | ORAL_TABLET | Freq: Every day | ORAL | Status: DC

## 2017-12-11 MED ORDER — MIDAZOLAM HCL 2 MG/2ML IJ SOLN
INTRAMUSCULAR | Status: AC
  Filled 2017-12-11: qty 2

## 2017-12-11 MED ORDER — PROPOFOL 10 MG/ML IV BOLUS
INTRAVENOUS | Status: AC
  Filled 2017-12-11: qty 20

## 2017-12-11 MED ORDER — CEFAZOLIN SODIUM-DEXTROSE 1-4 GM/50ML-% IV SOLN: 1.0000 g | Freq: Three times a day (TID) | INTRAVENOUS | Status: DC

## 2017-12-11 MED ORDER — ONDANSETRON HCL 4 MG/2ML IJ SOLN: 4.0000 mg | Freq: Four times a day (QID) | INTRAMUSCULAR | Status: DC | PRN

## 2017-12-11 MED ORDER — SODIUM CHLORIDE 0.9 % IV SOLN: 250.0000 mL | INTRAVENOUS | Status: DC

## 2017-12-11 MED ORDER — METHOCARBAMOL 500 MG PO TABS
500.0000 mg | ORAL_TABLET | Freq: Three times a day (TID) | ORAL | Status: DC | PRN
  Administered 2017-12-11: 500 mg via ORAL

## 2017-12-11 MED ORDER — ACETAMINOPHEN 650 MG RE SUPP: 650.0000 mg | RECTAL | Status: DC | PRN

## 2017-12-11 MED ORDER — QUETIAPINE FUMARATE 200 MG PO TABS
200.0000 mg | ORAL_TABLET | Freq: Every day | ORAL | Status: DC
  Filled 2017-12-11: qty 1

## 2017-12-11 MED ORDER — PHENYLEPHRINE 40 MCG/ML (10ML) SYRINGE FOR IV PUSH (FOR BLOOD PRESSURE SUPPORT)
PREFILLED_SYRINGE | INTRAVENOUS | Status: DC | PRN
  Administered 2017-12-11 (×3): 80 ug via INTRAVENOUS

## 2017-12-11 MED ORDER — ADULT GUMMY PO CHEW: 1.0000 | CHEWABLE_TABLET | Freq: Every day | ORAL | Status: DC

## 2017-12-11 MED ORDER — LIDOCAINE HCL (CARDIAC) PF 100 MG/5ML IV SOSY
PREFILLED_SYRINGE | INTRAVENOUS | Status: DC | PRN
  Administered 2017-12-11: 60 mg via INTRAVENOUS

## 2017-12-11 MED ORDER — MIDAZOLAM HCL 5 MG/5ML IJ SOLN
INTRAMUSCULAR | Status: DC | PRN
  Administered 2017-12-11: 2 mg via INTRAVENOUS

## 2017-12-11 MED ORDER — POTASSIUM CHLORIDE IN NACL 20-0.9 MEQ/L-% IV SOLN: INTRAVENOUS | Status: DC

## 2017-12-11 MED ORDER — METHOCARBAMOL 500 MG PO TABS
ORAL_TABLET | ORAL | Status: AC
  Filled 2017-12-11: qty 1

## 2017-12-11 MED ORDER — FENTANYL CITRATE (PF) 100 MCG/2ML IJ SOLN
INTRAMUSCULAR | Status: AC
  Filled 2017-12-11: qty 2

## 2017-12-11 MED ORDER — SODIUM CHLORIDE 0.9% FLUSH: 3.0000 mL | INTRAVENOUS | Status: DC | PRN

## 2017-12-11 MED ORDER — BACITRACIN ZINC 500 UNIT/GM EX OINT
TOPICAL_OINTMENT | CUTANEOUS | Status: AC
  Filled 2017-12-11: qty 28.35

## 2017-12-11 MED ORDER — OXYCODONE ER 13.5 MG PO C12A: 13.5000 mg | EXTENDED_RELEASE_CAPSULE | Freq: Two times a day (BID) | ORAL | Status: DC

## 2017-12-11 MED ORDER — FENTANYL CITRATE (PF) 250 MCG/5ML IJ SOLN
INTRAMUSCULAR | Status: AC
  Filled 2017-12-11: qty 5

## 2017-12-11 MED ORDER — ALUM & MAG HYDROXIDE-SIMETH 200-200-20 MG/5ML PO SUSP: 30.0000 mL | Freq: Four times a day (QID) | ORAL | Status: DC | PRN

## 2017-12-11 MED ORDER — SODIUM CHLORIDE 0.9% FLUSH: 3.0000 mL | Freq: Two times a day (BID) | INTRAVENOUS | Status: DC

## 2017-12-11 MED ORDER — CALCIUM CARBONATE-VITAMIN D 500-200 MG-UNIT PO TABS: 1.0000 | ORAL_TABLET | Freq: Every day | ORAL | Status: DC

## 2017-12-11 MED ORDER — CEFAZOLIN SODIUM-DEXTROSE 2-4 GM/100ML-% IV SOLN
2.0000 g | INTRAVENOUS | Status: AC
  Administered 2017-12-11: 2 g via INTRAVENOUS

## 2017-12-11 MED ORDER — ONDANSETRON HCL 4 MG PO TABS: 8.0000 mg | ORAL_TABLET | Freq: Three times a day (TID) | ORAL | Status: DC | PRN

## 2017-12-11 MED ORDER — CYCLOBENZAPRINE HCL 10 MG PO TABS: 10.0000 mg | ORAL_TABLET | Freq: Three times a day (TID) | ORAL | Status: DC | PRN

## 2017-12-11 MED ORDER — ADULT MULTIVITAMIN W/MINERALS CH: 1.0000 | ORAL_TABLET | Freq: Every day | ORAL | Status: DC

## 2017-12-11 MED ORDER — VALACYCLOVIR HCL 500 MG PO TABS
500.0000 mg | ORAL_TABLET | Freq: Two times a day (BID) | ORAL | Status: DC | PRN
  Filled 2017-12-11: qty 1

## 2017-12-11 MED ORDER — DIAZEPAM 5 MG PO TABS: 5.0000 mg | ORAL_TABLET | Freq: Four times a day (QID) | ORAL | Status: DC | PRN

## 2017-12-11 MED ORDER — BUPROPION HCL ER (XL) 300 MG PO TB24: 300.0000 mg | ORAL_TABLET | Freq: Every day | ORAL | Status: DC

## 2017-12-11 MED ORDER — LACTATED RINGERS IV SOLN
INTRAVENOUS | Status: DC
  Administered 2017-12-11 (×2): via INTRAVENOUS

## 2017-12-11 MED ORDER — OXYCODONE-ACETAMINOPHEN 5-325 MG PO TABS
ORAL_TABLET | ORAL | Status: AC
  Filled 2017-12-11: qty 2

## 2017-12-11 MED ORDER — CEFAZOLIN SODIUM-DEXTROSE 2-4 GM/100ML-% IV SOLN
INTRAVENOUS | Status: AC
  Filled 2017-12-11: qty 100

## 2017-12-11 MED ORDER — IOPAMIDOL (ISOVUE-300) INJECTION 61%
INTRAVENOUS | Status: AC
  Filled 2017-12-11: qty 50

## 2017-12-11 MED ORDER — FLUOXETINE HCL 20 MG PO CAPS: 40.0000 mg | ORAL_CAPSULE | Freq: Every day | ORAL | Status: DC

## 2017-12-11 MED ORDER — POVIDONE-IODINE 7.5 % EX SOLN: Freq: Once | CUTANEOUS | Status: DC

## 2017-12-11 MED ORDER — ROCURONIUM BROMIDE 10 MG/ML (PF) SYRINGE
PREFILLED_SYRINGE | INTRAVENOUS | Status: DC | PRN
  Administered 2017-12-11: 40 mg via INTRAVENOUS

## 2017-12-11 MED ORDER — PHENOL 1.4 % MT LIQD: 1.0000 | OROMUCOSAL | Status: DC | PRN

## 2017-12-11 MED ORDER — FENTANYL CITRATE (PF) 100 MCG/2ML IJ SOLN
INTRAMUSCULAR | Status: DC | PRN
  Administered 2017-12-11: 100 ug via INTRAVENOUS

## 2017-12-11 MED ORDER — GABAPENTIN 600 MG PO TABS: 600.0000 mg | ORAL_TABLET | Freq: Three times a day (TID) | ORAL | Status: DC

## 2017-12-11 MED ORDER — OXCARBAZEPINE 300 MG PO TABS
1200.0000 mg | ORAL_TABLET | Freq: Every day | ORAL | Status: DC
  Filled 2017-12-11: qty 4

## 2017-12-11 MED ORDER — ACYCLOVIR 5 % EX OINT
TOPICAL_OINTMENT | Freq: Three times a day (TID) | CUTANEOUS | Status: DC | PRN
  Filled 2017-12-11: qty 15

## 2017-12-11 MED ORDER — OXYCODONE HCL 5 MG PO TABS
10.0000 mg | ORAL_TABLET | Freq: Every day | ORAL | Status: DC | PRN
  Filled 2017-12-11: qty 2

## 2017-12-11 MED ORDER — OXYCODONE HCL ER 15 MG PO T12A: 15.0000 mg | EXTENDED_RELEASE_TABLET | Freq: Two times a day (BID) | ORAL | Status: DC

## 2017-12-11 MED ORDER — EPHEDRINE 5 MG/ML INJ
INTRAVENOUS | Status: AC
  Filled 2017-12-11: qty 10

## 2017-12-11 MED ORDER — NAPROXEN 250 MG PO TABS: 250.0000 mg | ORAL_TABLET | Freq: Two times a day (BID) | ORAL | Status: DC | PRN

## 2017-12-11 MED ORDER — PROPOFOL 10 MG/ML IV BOLUS
INTRAVENOUS | Status: DC | PRN
  Administered 2017-12-11: 100 mg via INTRAVENOUS

## 2017-12-11 MED ORDER — ONDANSETRON HCL 4 MG/2ML IJ SOLN
INTRAMUSCULAR | Status: DC | PRN
  Administered 2017-12-11: 4 mg via INTRAVENOUS

## 2017-12-11 MED ORDER — FENTANYL CITRATE (PF) 100 MCG/2ML IJ SOLN
25.0000 ug | INTRAMUSCULAR | Status: DC | PRN
  Administered 2017-12-11: 50 ug via INTRAVENOUS
  Administered 2017-12-11 (×2): 25 ug via INTRAVENOUS

## 2017-12-11 MED ORDER — ZOLPIDEM TARTRATE 5 MG PO TABS: 5.0000 mg | ORAL_TABLET | Freq: Every evening | ORAL | Status: DC | PRN

## 2017-12-11 MED ORDER — SUCCINYLCHOLINE CHLORIDE 200 MG/10ML IV SOSY
PREFILLED_SYRINGE | INTRAVENOUS | Status: AC
  Filled 2017-12-11: qty 10

## 2017-12-11 MED ORDER — ALBUTEROL SULFATE (2.5 MG/3ML) 0.083% IN NEBU: 3.0000 mL | INHALATION_SOLUTION | Freq: Four times a day (QID) | RESPIRATORY_TRACT | Status: DC | PRN

## 2017-12-11 MED ORDER — MENTHOL 3 MG MT LOZG: 1.0000 | LOZENGE | OROMUCOSAL | Status: DC | PRN

## 2017-12-11 MED ORDER — OXYCODONE-ACETAMINOPHEN 5-325 MG PO TABS
1.0000 | ORAL_TABLET | ORAL | Status: DC | PRN
  Administered 2017-12-11: 2 via ORAL

## 2017-12-11 MED ORDER — BACITRACIN 500 UNIT/GM EX OINT
TOPICAL_OINTMENT | CUTANEOUS | Status: DC | PRN
  Administered 2017-12-11: 1 via TOPICAL

## 2017-12-11 MED ORDER — ONDANSETRON HCL 4 MG PO TABS: 4.0000 mg | ORAL_TABLET | Freq: Four times a day (QID) | ORAL | Status: DC | PRN

## 2017-12-11 MED ORDER — IOPAMIDOL (ISOVUE-300) INJECTION 61%
INTRAVENOUS | Status: DC | PRN
  Administered 2017-12-11: 50 mL

## 2017-12-11 MED ORDER — ONDANSETRON HCL 4 MG/2ML IJ SOLN
INTRAMUSCULAR | Status: AC
  Filled 2017-12-11: qty 2

## 2017-12-11 MED ORDER — SUGAMMADEX SODIUM 200 MG/2ML IV SOLN
INTRAVENOUS | Status: DC | PRN
  Administered 2017-12-11: 200 mg via INTRAVENOUS

## 2017-12-11 MED ORDER — ACETAMINOPHEN 325 MG PO TABS: 650.0000 mg | ORAL_TABLET | ORAL | Status: DC | PRN

## 2017-12-11 MED ORDER — LEVOTHYROXINE SODIUM 112 MCG PO TABS
112.0000 ug | ORAL_TABLET | Freq: Every day | ORAL | Status: DC
  Filled 2017-12-11: qty 1

## 2017-12-11 SURGICAL SUPPLY — 44 items
BLADE SURG 15 STRL LF DISP TIS (BLADE) ×1 IMPLANT
BLADE SURG 15 STRL SS (BLADE) ×2
CEMENT BONE KYPHX HV R (Orthopedic Implant) ×1 IMPLANT
COVER MAYO STAND STRL (DRAPES) ×2 IMPLANT
COVER SURGICAL LIGHT HANDLE (MISCELLANEOUS) ×2 IMPLANT
CURETTE EXPRESS SZ2 7MM (INSTRUMENTS) IMPLANT
CURRETTE EXPRESS SZ2 7MM (INSTRUMENTS) ×2
DRAPE C-ARM 42X72 X-RAY (DRAPES) ×2 IMPLANT
DRAPE HALF SHEET 40X57 (DRAPES) IMPLANT
DRAPE INCISE IOBAN 66X45 STRL (DRAPES) ×2 IMPLANT
DRAPE LAPAROTOMY T 102X78X121 (DRAPES) ×2 IMPLANT
DRAPE SURG 17X23 STRL (DRAPES) ×8 IMPLANT
DURAPREP 26ML APPLICATOR (WOUND CARE) ×2 IMPLANT
GAUZE 4X4 16PLY RFD (DISPOSABLE) ×2 IMPLANT
GAUZE SPONGE 4X4 12PLY STRL LF (GAUZE/BANDAGES/DRESSINGS) ×1 IMPLANT
GLOVE BIO SURGEON STRL SZ7 (GLOVE) ×2 IMPLANT
GLOVE BIO SURGEON STRL SZ8 (GLOVE) ×2 IMPLANT
GLOVE BIOGEL PI IND STRL 7.0 (GLOVE) ×1 IMPLANT
GLOVE BIOGEL PI IND STRL 8 (GLOVE) ×1 IMPLANT
GLOVE BIOGEL PI INDICATOR 7.0 (GLOVE) ×1
GLOVE BIOGEL PI INDICATOR 8 (GLOVE) ×1
GOWN STRL REUS W/ TWL LRG LVL3 (GOWN DISPOSABLE) ×2 IMPLANT
GOWN STRL REUS W/ TWL XL LVL3 (GOWN DISPOSABLE) ×1 IMPLANT
GOWN STRL REUS W/TWL LRG LVL3 (GOWN DISPOSABLE) ×4
GOWN STRL REUS W/TWL XL LVL3 (GOWN DISPOSABLE) ×2
KIT BASIN OR (CUSTOM PROCEDURE TRAY) ×2 IMPLANT
KIT TURNOVER KIT B (KITS) ×2 IMPLANT
NDL HYPO 25X1 1.5 SAFETY (NEEDLE) IMPLANT
NDL SPNL 18GX3.5 QUINCKE PK (NEEDLE) ×2 IMPLANT
NEEDLE 22X1 1/2 (OR ONLY) (NEEDLE) IMPLANT
NEEDLE HYPO 25X1 1.5 SAFETY (NEEDLE) IMPLANT
NEEDLE SPNL 18GX3.5 QUINCKE PK (NEEDLE) ×4 IMPLANT
NS IRRIG 1000ML POUR BTL (IV SOLUTION) ×2 IMPLANT
PACK UNIVERSAL I (CUSTOM PROCEDURE TRAY) ×2 IMPLANT
PAD ARMBOARD 7.5X6 YLW CONV (MISCELLANEOUS) ×4 IMPLANT
POSITIONER HEAD PRONE TRACH (MISCELLANEOUS) ×2 IMPLANT
SUT MNCRL AB 4-0 PS2 18 (SUTURE) ×2 IMPLANT
SYR BULB IRRIGATION 50ML (SYRINGE) ×2 IMPLANT
SYR CONTROL 10ML LL (SYRINGE) ×2 IMPLANT
TAPE CLOTH SURG 4X10 WHT LF (GAUZE/BANDAGES/DRESSINGS) ×1 IMPLANT
TOWEL OR 17X24 6PK STRL BLUE (TOWEL DISPOSABLE) ×2 IMPLANT
TOWEL OR 17X26 10 PK STRL BLUE (TOWEL DISPOSABLE) ×2 IMPLANT
TRAY KYPHOPAK 15/3 ONESTEP 1ST (MISCELLANEOUS) IMPLANT
TRAY KYPHOPAK 20/3 ONESTEP 1ST (MISCELLANEOUS) ×1 IMPLANT

## 2017-12-11 NOTE — Anesthesia Procedure Notes (Signed)
Procedure Name: Intubation Date/Time: 12/11/2017 1:47 PM Performed by: Carney Living, CRNA Pre-anesthesia Checklist: Patient identified, Emergency Drugs available, Suction available, Patient being monitored and Timeout performed Patient Re-evaluated:Patient Re-evaluated prior to induction Oxygen Delivery Method: Circle system utilized Preoxygenation: Pre-oxygenation with 100% oxygen Induction Type: IV induction Ventilation: Mask ventilation without difficulty Laryngoscope Size: Mac and 4 Grade View: Grade I Tube type: Oral Tube size: 7.0 mm Number of attempts: 1 Airway Equipment and Method: Stylet Placement Confirmation: ETT inserted through vocal cords under direct vision,  positive ETCO2 and breath sounds checked- equal and bilateral Secured at: 21 cm Tube secured with: Tape Dental Injury: Teeth and Oropharynx as per pre-operative assessment

## 2017-12-11 NOTE — Transfer of Care (Signed)
Immediate Anesthesia Transfer of Care Note  Patient: Victoria Levine  Procedure(s) Performed: LUMBAR 2 KYPHOPLASTY (N/A )  Patient Location: PACU  Anesthesia Type:General  Level of Consciousness: awake  Airway & Oxygen Therapy: Patient Spontanous Breathing and Patient connected to nasal cannula oxygen  Post-op Assessment: Report given to RN and Post -op Vital signs reviewed and stable  Post vital signs: Reviewed and stable  Last Vitals:  Vitals Value Taken Time  BP 124/73 12/11/2017  3:10 PM  Temp    Pulse 90 12/11/2017  3:15 PM  Resp 25 12/11/2017  3:15 PM  SpO2 96 % 12/11/2017  3:15 PM  Vitals shown include unvalidated device data.  Last Pain:  Vitals:   12/11/17 1105  TempSrc: Oral         Complications: No apparent anesthesia complications

## 2017-12-11 NOTE — H&P (Signed)
PREOPERATIVE H&P  Chief Complaint: Low back  HPI: Victoria Levine is a 67 y.o. female who presents with ongoing pain in the low back  MRI reveals a compression fracture at L2  Patient has failed multiple forms of conservative care and continues to have pain (see office notes for additional details regarding the patient's full course of treatment)  Past Medical History:  Diagnosis Date  . Anemia    low iron  . Anxiety   . Asthma   . Bipolar disorder ([place])   . Chronic back pain   . Compression fracture of L2 lumbar vertebra ([facility])   . DDD (degenerative disc disease)   . Depression   . Dyspnea   . GERD (gastroesophageal reflux disease)   . H/O alcohol abuse    28 years sober  . H/O suicide attempt   . Herpes   . History of hiatal hernia   . Hypothyroidism   . Migraines   . Osteoporosis 11/2013   T score -2.6 AP spine. 1 dose Reclast with reaction Prolia since 10/2011 most recent DEXA shows significant improvement at all measured sites.  . Pneumonia   . S/P adenoidectomy   . S/P tonsillectomy    Past Surgical History:  Procedure Laterality Date  . CESAREAN SECTION    . COLONOSCOPY     as a teenager  . Compression fracture spine    . DILATION AND CURETTAGE OF UTERUS    . EYE SURGERY Bilateral    CATARACTS REMOVED, NO LENS PLACED  . HYSTEROSCOPY    . KYPHOPLASTY N/A 12/19/2016   Procedure: THORACIC 12 KYPHOPLASTY; REQUEST 60 MINS AND FLIP ROOM;  Surgeon: Phylliss Bob, MD;  Location: [facility];  Service: Orthopedics;  Laterality: N/A;  THORACIC 12 KYPHOPLASTY; REQUEST 60 MINS AND FLIP ROOM  . LUMBAR FUSION    . TONSILLECTOMY     Social History   Socioeconomic History  . Marital status: Single    Spouse name: Not on file  . Number of children: Not on file  . Years of education: Not on file  . Highest education level: Not on file  Occupational History  . Not on file  Social Needs  . Financial resource strain: Not on file  . Food insecurity:    Worry: Not  on file    Inability: Not on file  . Transportation needs:    Medical: Not on file    Non-medical: Not on file  Tobacco Use  . Smoking status: Never Smoker  . Smokeless tobacco: Never Used  Substance and Sexual Activity  . Alcohol use: No    Alcohol/week: 0.0 standard drinks    Comment: hx of heavy alcohol use - 28 years sober  . Drug use: Yes    Types: Benzodiazepines    Comment: hx of cocaine use in her 20's  . Sexual activity: Not Currently    Birth control/protection: Post-menopausal    Comment: intercourse age 74, sexual partners less than 5  Lifestyle  . Physical activity:    Days per week: Not on file    Minutes per session: Not on file  . Stress: Not on file  Relationships  . Social connections:    Talks on phone: Not on file    Gets together: Not on file    Attends religious service: Not on file    Active member of club or organization: Not on file    Attends meetings of clubs or organizations: Not on file  Relationship status: Not on file  Other Topics Concern  . Not on file  Social History Narrative  . Not on file   Family History  Adopted: Yes   Allergies  Allergen Reactions  . Prednisone Other (See Comments)    Cannot tolerate ORALLY, but can tolerate by shot (interacts with anxiety & bipolarity)  . Lithium Carbonate Other (See Comments)    Patient was taken off of this because of a weight gain of 40 pounds  . Sulfa Antibiotics Other (See Comments)    From childhood; reaction not recalled   Prior to Admission medications   Medication Sig Start Date End Date Taking? Authorizing Provider  buPROPion (WELLBUTRIN XL) 150 MG 24 hr tablet Take 300 mg by mouth daily.  10/01/16  Yes [provider]  cyclobenzaprine (FLEXERIL) 10 MG tablet Take 10 mg by mouth 3 (three) times daily as needed for muscle spasms (scheduled every night at bedtime).   Yes [provider]  FLUoxetine (PROZAC) 20 MG capsule Take 40 mg by mouth daily.    Yes [provider]  gabapentin (NEURONTIN) 600 MG tablet Take 600 mg by mouth 3 (three) times daily.  11/05/16  Yes [provider]  levothyroxine (SYNTHROID, LEVOTHROID) 112 MCG tablet Take 112 mcg by mouth daily before breakfast.  09/04/17  Yes [provider]  methocarbamol (ROBAXIN) 500 MG tablet Take 500 mg by mouth 3 (three) times daily as needed for muscle spasms.  08/27/17  Yes [provider]  ranitidine (ZANTAC) 300 MG tablet Take 300 mg by mouth at bedtime. 10/26/14  Yes [provider]  acyclovir ointment (ZOVIRAX) 5 % Apply topically 3 (three) times daily. As needed. Patient not taking: Reported on 10/25/2017 05/01/17   [name], NP  albuterol (PROVENTIL HFA;VENTOLIN HFA) 108 (90 Base) MCG/ACT inhaler Inhale 2 puffs into the lungs every 6 (six) hours as needed for wheezing or shortness of breath.    [provider]  calcium citrate-vitamin D (CALCIUM + D) 315-200 MG-UNIT per tablet Take 1 tablet by mouth daily. For bone health 10/18/14   Lindell Spar I, NP  denosumab (PROLIA) 60 MG/ML SOLN injection Inject 60 mg into the skin every 6 (six) months. Administer in upper arm, thigh, or abdomen  NEXT IS DUE 03-2017 Patient taking differently: Inject 60 mg into the skin every 6 (six) months. Administer in upper arm, thigh, or abdomen 03/05/17   [name], NP  Multiple Vitamin (MULTIVITAMIN) tablet Take 1 tablet by mouth daily. For low vitamin Patient not taking: Reported on 10/25/2017 10/18/14   Lindell Spar I, NP  Multiple Vitamins-Minerals (ADULT GUMMY) CHEW Chew 1 tablet by mouth daily.    [provider]  naproxen sodium (ALEVE) 220 MG tablet Take 220-440 mg by mouth 2 (two) times daily as needed (for pain).    [provider]  ondansetron (ZOFRAN) 8 MG tablet Take 8 mg by mouth every 8 (eight) hours as needed for nausea or vomiting (Migraine Head Ache symptoms).    [provider]  oxcarbazepine (TRILEPTAL) 600 MG tablet  Take 1,200 mg by mouth daily.     [provider]  OxyCODONE ER (XTAMPZA ER) 13.5 MG C12A Take 13.5 mg by mouth 2 (two) times daily. 11/22/16   [provider]  Oxycodone HCl 10 MG TABS Take 10 mg by mouth daily as needed (for breakthrough pain).  09/16/17   [provider]  QUEtiapine (SEROQUEL) 100 MG tablet Take 200 mg by  mouth at bedtime. 10/12/16   [provider]  rizatriptan (MAXALT-MLT) 10 MG disintegrating tablet Take 10 mg by mouth as needed for migraine.  11/08/14   [provider]  valACYclovir (VALTREX) 500 MG tablet Take one tablet twice daily 3-5 days then daily as needed. 05/01/17   [name], NP     All other systems have been reviewed and were otherwise negative with the exception of those mentioned in the HPI and as above.  Physical Exam: There were no vitals filed for this visit.  There is no height or weight on file to calculate BMI.  General: Alert, no acute distress Cardiovascular: No pedal edema Respiratory: No cyanosis, no use of accessory musculature Skin: No lesions in the area of chief complaint Neurologic: Sensation intact distally Psychiatric: Patient is competent for consent with normal mood and affect Lymphatic: No axillary or cervical lymphadenopathy  MUSCULOSKELETAL: + TTP at low back  Assessment/Plan: SUBACUTE LUMBAR 2 COMPRESSION FRACTURE Plan for Procedure(s): LUMBAR 2 KYPHOPLASTY   Sinclair Ship, MD 12/11/2017 7:47 AM

## 2017-12-11 NOTE — Progress Notes (Addendum)
Patients clothes, small purse (denin) will go to recovery.  NO one is with patient.  She states that either her brother or her neighbor can pick her up.   ? Not sure who will be staying with her tonight, note for surgeon placed on chart.

## 2017-12-11 NOTE — Progress Notes (Signed)
Pt was given D/C instructions with Rx's, verbal understanding was provided. Pt was in the room waiting on her neighbor to pick her up and she ambulated downstairs independently to be discharged. Pt is stable @ D/C. Holli Humbles, RN

## 2017-12-11 NOTE — Anesthesia Preprocedure Evaluation (Addendum)
Anesthesia Evaluation  Patient identified by MRN, date of birth, ID band Patient awake    Reviewed: Allergy & Precautions, H&P , NPO status , Patient's Chart, lab work & pertinent test results  Airway Mallampati: III  TM Distance: >3 FB Neck ROM: Full    Dental no notable dental hx. (+) Partial Upper, Partial Lower, Dental Advisory Given   Pulmonary asthma , COPD,  COPD inhaler,    Pulmonary exam normal breath sounds clear to auscultation       Cardiovascular negative cardio ROS   Rhythm:Regular Rate:Normal     Neuro/Psych  Headaches, Anxiety Depression Bipolar Disorder    GI/Hepatic Neg liver ROS, hiatal hernia, GERD  Medicated and Controlled,  Endo/Other  Hypothyroidism   Renal/GU negative Renal ROS  negative genitourinary   Musculoskeletal  (+) Arthritis , Osteoarthritis,    Abdominal   Peds  Hematology  (+) anemia ,   Anesthesia Other Findings   Reproductive/Obstetrics negative OB ROS                            Anesthesia Physical Anesthesia Plan  ASA: III  Anesthesia Plan: General   Post-op Pain Management:    Induction: Intravenous  PONV Risk Score and Plan: 4 or greater and Ondansetron, Propofol infusion, Midazolam and Treatment may vary due to age or medical condition  Airway Management Planned: Oral ETT  Additional Equipment:   Intra-op Plan:   Post-operative Plan: Extubation in OR  Informed Consent: I have reviewed the patients History and Physical, chart, labs and discussed the procedure including the risks, benefits and alternatives for the proposed anesthesia with the patient or authorized representative who has indicated his/her understanding and acceptance.   Dental advisory given  Plan Discussed with: CRNA, Anesthesiologist and Surgeon  Anesthesia Plan Comments:        Anesthesia Quick Evaluation

## 2017-12-11 NOTE — Op Note (Signed)
NAME:  Victoria Levine              MEDICAL RECORD NO.:  [id number]  PHYSICIAN:  Phylliss Bob, MD           DATE OF BIRTH: June 10, 1950  DATE OF PROCEDURE:  12/11/2017                              OPERATIVE REPORT   PREOPERATIVE DIAGNOSIS:  L2 compression fracture.  POSTOPERATIVE DIAGNOSIS:  L2 compression fracture.  PROCEDURE:  L2 kyphoplasty.  SURGEON:  Phylliss Bob, MD.  ASSISTANT:  None  ANESTHESIA:  General endotracheal anesthesia.  COMPLICATIONS:  None.  DISPOSITION:  Stable.  ESTIMATED BLOOD LOSS:  Minimal.  INDICATIONS FOR SURGERY:  Briefly, Victoria Levine is a very pleasant 67- year-old female, who did have an onset of pain in her low back.   Her pain was rather severe.  The patient's imaging studies did clearly reveal a L2 compression fracture. We did attempt a trial of nonoperative treatment, but the patient continued to feel rather debilitated as a result of her ongoing pain.  Given her ongoing pain and dysfunction, we did discuss proceeding with the procedure noted above.  I did fully discuss the procedure with the patient, and she did wish to proceed.  OPERATIVE DETAILS:  On 12/11/2017, the patient was brought to surgery and general endotracheal anesthesia was administered.  The patient was placed prone on a well-padded flat [facility] bed with gel rolls placed under the patient's chest and hips.  Antibiotics were given.  AP and lateral fluoroscopy was brought into the field.  The L2 pedicles were marked out in the usual fashion.  After a time-out procedure was performed, I did advance Jamshidis across the L2 pedicles on the right and left sides.  I then drilled through the Jamshidis.  I then inserted kyphoplasty balloons and I was able to inflate the balloons with approximately 3cc of contrast.  Partial restoration of the superior endplate was noted.  At this point, after the cement was mixed, a total of approximately 4.5cc of cement was injected, half on  the right, and half on the left.  Excellent interdigitation of cement was identified.  There was noted to be interdigitation of cement into the fractured upper endplate as well.  There was no abnormal extravasation of cement identified.  The cement was then allowed to harden, after which point the Jamshidis were removed.  The wound  was then irrigated and closed using 4-0  Monocryl.  Bacitracin and a sterile dressing were applied.  The patient was then awoken from general endotracheal anesthesia and transferred to recovery in stable condition.  Of note, I did discuss with the patient prior to her surgery that she would be able to go home following surgery on the day of surgery.  The patient expressed no reservations with this to me, however, after she was asleep, the anesthesia team did express to me that she told them she had no one to stay with her overnight.  We therefore will admit her overnight with the plan of discharging her tomorrow.   Phylliss Bob, MD

## 2017-12-11 NOTE — Evaluation (Signed)
Physical Therapy Evaluation and Discharge Patient Details Name: Victoria Levine MRN: [id number] DOB: 11-30-1950 Today's Date: 12/11/2017   History of Present Illness  Pt is a 67 y/o female s/p L2 Kyphoplasty. PMH includes bipolar disorder, osteoporosis, asthma, and previous back surgery.   Clinical Impression  Patient evaluated by Physical Therapy with no further acute PT needs identified. All education has been completed and the patient has no further questions. Pt unsteady without AD, however, noted much improved steadiness with use of RW. Educated about use of walker at home to increase safety and pt agreeable. Reviewed back precautions and generalized walking program and stressed importance of having assist at home. Pt assured PT she would have brother or neighbor stay with her. Pt very eager to go home tonight. See below for any follow-up Physical Therapy or equipment needs. PT is signing off. Thank you for this referral. If needs change, please reconsult.      Follow Up Recommendations No PT follow up;Supervision/Assistance - 24 hour(would benefit from outpatient PT once cleared by MD)    Equipment Recommendations  None recommended by PT    Recommendations for Other Services       Precautions / Restrictions Precautions Precautions: Back Precaution Booklet Issued: Yes (comment) Precaution Comments: Reviewed back precautions and gave handout. Reviewed how to maintain precautions during ADL tasks.  Required Braces or Orthoses: Spinal Brace Spinal Brace: Lumbar corset;Applied in sitting position Restrictions Weight Bearing Restrictions: No      Mobility  Bed Mobility               General bed mobility comments: Pt sitting at EOB. Verbally reviewed log roll technique.   Transfers Overall transfer level: Needs assistance Equipment used: None Transfers: Sit to/from Stand Sit to Stand: Supervision         General transfer comment: supervision for safety. Pt  walking hands up thighs to stand fully upright and required extended time. Cues to avoid bending during transition.   Ambulation/Gait Ambulation/Gait assistance: Min assist;Min guard;Supervision Gait Distance (Feet): 200 Feet Assistive device: Rolling walker (2 wheeled);None Gait Pattern/deviations: Step-through pattern;Decreased stride length;Trunk flexed Gait velocity: Decreased    General Gait Details: Slow unsteady gait without AD. Pt reaching for walls and handrails and required min A for steadying. Much improved balance noted with use of RW. Educated to use walker at home to increase safety and stability. Educated about generalized walking program to perform at home.   Stairs            Wheelchair Mobility    Modified Rankin (Stroke Patients Only)       Balance Overall balance assessment: Needs assistance Sitting-balance support: No upper extremity supported;Feet supported Sitting balance-Leahy Scale: Fair     Standing balance support: No upper extremity supported;During functional activity;Bilateral upper extremity supported Standing balance-Leahy Scale: Fair Standing balance comment: Able to maintain static standing without UE support                              Pertinent Vitals/Pain Pain Assessment: Faces Faces Pain Scale: Hurts a little bit Pain Location: back  Pain Descriptors / Indicators: Aching;Operative site guarding Pain Intervention(s): Limited activity within patient's tolerance;Monitored during session;Repositioned    Home Living Family/patient expects to be discharged to:: Private residence Living Arrangements: Alone Available Help at Discharge: Neighbor;Family;Available 24 hours/day Type of Home: House Home Access: Level entry     Home Layout: One level Home Equipment: Gilford Rile -  4 wheels;Grab bars - tub/shower Additional Comments: Reports brother or neighbor will be assisting at d/c     Prior Function Level of Independence:  Independent               Hand Dominance        Extremity/Trunk Assessment   Upper Extremity Assessment Upper Extremity Assessment: Defer to OT evaluation    Lower Extremity Assessment Lower Extremity Assessment: Generalized weakness    Cervical / Trunk Assessment Cervical / Trunk Assessment: Other exceptions Cervical / Trunk Exceptions: s/p lumbar surgery.   Communication   Communication: No difficulties  Cognition Arousal/Alertness: Awake/alert Behavior During Therapy: WFL for tasks assessed/performed Overall Cognitive Status: No family/caregiver present to determine baseline cognitive functioning                                 General Comments: Pt very easily distracted and required cues to maintain precautions.       General Comments General comments (skin integrity, edema, etc.): Educated about importance of need for assist at home, especially initially. Educated about not driving until MD clears as well.     Exercises     Assessment/Plan    PT Assessment Patent does not need any further PT services  PT Problem List         PT Treatment Interventions      PT Goals (Current goals can be found in the Care Plan section)  Acute Rehab PT Goals Patient Stated Goal: to go home tonight  PT Goal Formulation: With patient Time For Goal Achievement: 12/11/17 Potential to Achieve Goals: Good    Frequency     Barriers to discharge        Co-evaluation               AM-PAC PT "6 Clicks" Daily Activity  Outcome Measure Difficulty turning over in bed (including adjusting bedclothes, sheets and blankets)?: A Little Difficulty moving from lying on back to sitting on the side of the bed? : A Little Difficulty sitting down on and standing up from a chair with arms (e.g., wheelchair, bedside commode, etc,.)?: A Little Help needed moving to and from a bed to chair (including a wheelchair)?: A Little Help needed walking in hospital room?: A  Little Help needed climbing 3-5 steps with a railing? : A Lot 6 Click Score: 17    End of Session Equipment Utilized During Treatment: Back brace Activity Tolerance: Patient tolerated treatment well Patient left: in bed;with call bell/phone within reach(sitting EOB ) Nurse Communication: Mobility status PT Visit Diagnosis: Unsteadiness on feet (R26.81);Other abnormalities of gait and mobility (R26.89);Pain Pain - part of body: (back )    Time: 1816-1830 PT Time Calculation (min) (ACUTE ONLY): 14 min   Charges:   PT Evaluation $PT Eval Low Complexity: Carroll, PT, DPT  Acute Rehabilitation Services  Pager: [phone] Office: [phone]   Rudean Hitt 12/11/2017, 6:37 PM

## 2017-12-11 NOTE — Progress Notes (Signed)
Pt doing well and has requested to be discharged. Dr. Lynann Bologna was called and he gave a verbal order to be D/C'd if OK with anesthesia. I called Dr. Linna Caprice and notified him that the Pt stated that "her neighbor would stay the night with her because she wanted to go home" and he said as long as she as someone with her overnight she was alright to be D/C'd. Holli Humbles, RN

## 2017-12-12 ENCOUNTER — Encounter (HOSPITAL_COMMUNITY): Payer: Self-pay | Admitting: Orthopedic Surgery

## 2017-12-12 NOTE — Anesthesia Postprocedure Evaluation (Signed)
Anesthesia Post Note  Patient: Victoria Levine  Procedure(s) Performed: LUMBAR 2 KYPHOPLASTY (N/A )     Patient location during evaluation: PACU Anesthesia Type: General Level of consciousness: awake and alert Pain management: pain level controlled Vital Signs Assessment: post-procedure vital signs reviewed and stable Respiratory status: spontaneous breathing, nonlabored ventilation, respiratory function stable and patient connected to nasal cannula oxygen Cardiovascular status: blood pressure returned to baseline and stable Postop Assessment: no apparent nausea or vomiting Anesthetic complications: no    Last Vitals:  Vitals:   12/11/17 1640 12/11/17 1655  BP: 115/79 116/80  Pulse: 73 72  Resp: 12 13  Temp: (!) 36.4 C   SpO2: 95% 96%    Last Pain:  Vitals:   12/11/17 1745  TempSrc:   PainSc: 0-No pain                 Lidia Collum

## 2017-12-25 DIAGNOSIS — Z9889 Other specified postprocedural states: Secondary | ICD-10-CM | POA: Diagnosis not present

## 2018-01-09 ENCOUNTER — Other Ambulatory Visit: Payer: Self-pay | Admitting: Obstetrics & Gynecology

## 2018-01-09 ENCOUNTER — Other Ambulatory Visit: Payer: Self-pay | Admitting: Gynecology

## 2018-01-09 DIAGNOSIS — M810 Age-related osteoporosis without current pathological fracture: Secondary | ICD-10-CM

## 2018-01-14 DIAGNOSIS — F4310 Post-traumatic stress disorder, unspecified: Secondary | ICD-10-CM | POA: Diagnosis not present

## 2018-01-14 DIAGNOSIS — F319 Bipolar disorder, unspecified: Secondary | ICD-10-CM | POA: Diagnosis not present

## 2018-01-14 DIAGNOSIS — F441 Dissociative fugue: Secondary | ICD-10-CM | POA: Diagnosis not present

## 2018-02-01 ENCOUNTER — Other Ambulatory Visit: Payer: Self-pay

## 2018-02-01 ENCOUNTER — Emergency Department (HOSPITAL_COMMUNITY)
Admission: EM | Admit: 2018-02-01 | Discharge: 2018-02-03 | Disposition: A | Discharge status: Other Acute Inpatient Hospital | Payer: Medicare HMO | Attending: Emergency Medicine | Admitting: Emergency Medicine

## 2018-02-01 ENCOUNTER — Encounter (HOSPITAL_COMMUNITY): Payer: Self-pay

## 2018-02-01 DIAGNOSIS — F333 Major depressive disorder, recurrent, severe with psychotic symptoms: Secondary | ICD-10-CM | POA: Diagnosis not present

## 2018-02-01 DIAGNOSIS — F319 Bipolar disorder, unspecified: Secondary | ICD-10-CM | POA: Diagnosis not present

## 2018-02-01 DIAGNOSIS — Z046 Encounter for general psychiatric examination, requested by authority: Secondary | ICD-10-CM | POA: Diagnosis not present

## 2018-02-01 DIAGNOSIS — J45909 Unspecified asthma, uncomplicated: Secondary | ICD-10-CM | POA: Diagnosis not present

## 2018-02-01 DIAGNOSIS — Z79899 Other long term (current) drug therapy: Secondary | ICD-10-CM | POA: Diagnosis not present

## 2018-02-01 DIAGNOSIS — E039 Hypothyroidism, unspecified: Secondary | ICD-10-CM | POA: Insufficient documentation

## 2018-02-01 DIAGNOSIS — F3130 Bipolar disorder, current episode depressed, mild or moderate severity, unspecified: Secondary | ICD-10-CM | POA: Diagnosis not present

## 2018-02-01 DIAGNOSIS — R441 Visual hallucinations: Secondary | ICD-10-CM | POA: Diagnosis present

## 2018-02-01 DIAGNOSIS — F989 Unspecified behavioral and emotional disorders with onset usually occurring in childhood and adolescence: Secondary | ICD-10-CM | POA: Diagnosis not present

## 2018-02-01 DIAGNOSIS — R51 Headache: Secondary | ICD-10-CM | POA: Diagnosis not present

## 2018-02-01 DIAGNOSIS — F69 Unspecified disorder of adult personality and behavior: Secondary | ICD-10-CM

## 2018-02-01 LAB — URINALYSIS, ROUTINE W REFLEX MICROSCOPIC
Bilirubin Urine: NEGATIVE
Glucose, UA: NEGATIVE mg/dL
Hgb urine dipstick: NEGATIVE
Ketones, ur: 20 mg/dL — AB
Nitrite: NEGATIVE
Protein, ur: 100 mg/dL — AB
Specific Gravity, Urine: 1.031 — ABNORMAL HIGH (ref 1.005–1.030)
pH: 5 (ref 5.0–8.0)

## 2018-02-01 LAB — BASIC METABOLIC PANEL
Anion gap: 13 (ref 5–15)
BUN: 21 mg/dL (ref 8–23)
CO2: 21 mmol/L — ABNORMAL LOW (ref 22–32)
Calcium: 10.5 mg/dL — ABNORMAL HIGH (ref 8.9–10.3)
Chloride: 104 mmol/L (ref 98–111)
Creatinine, Ser: 0.82 mg/dL (ref 0.44–1.00)
GFR calc Af Amer: >60 mL/min (ref >60)
GFR calc non Af Amer: >60 mL/min (ref >60)
Glucose, Bld: 108 mg/dL — ABNORMAL HIGH (ref 70–99)
Potassium: 3.1 mmol/L — ABNORMAL LOW (ref 3.5–5.1)
Sodium: 138 mmol/L (ref 135–145)

## 2018-02-01 LAB — CBC WITH DIFFERENTIAL/PLATELET
Abs Immature Granulocytes: 0.07 10*3/uL (ref 0.00–0.07)
Basophils Absolute: 0.1 10*3/uL (ref 0.0–0.1)
Basophils Relative: 0 %
Eosinophils Absolute: 0 10*3/uL (ref 0.0–0.5)
Eosinophils Relative: 0 %
HCT: 46.2 % — ABNORMAL HIGH (ref 36.0–46.0)
Hemoglobin: 15.2 g/dL — ABNORMAL HIGH (ref 12.0–15.0)
Immature Granulocytes: 1 %
Lymphocytes Relative: 15 %
Lymphs Abs: 2 10*3/uL (ref 0.7–4.0)
MCH: 28.7 pg (ref 26.0–34.0)
MCHC: 32.9 g/dL (ref 30.0–36.0)
MCV: 87.3 fL (ref 80.0–100.0)
Monocytes Absolute: 0.8 10*3/uL (ref 0.1–1.0)
Monocytes Relative: 6 %
Neutro Abs: 10.2 10*3/uL — ABNORMAL HIGH (ref 1.7–7.7)
Neutrophils Relative %: 78 %
Platelets: 476 10*3/uL — ABNORMAL HIGH (ref 150–400)
RBC: 5.29 MIL/uL — ABNORMAL HIGH (ref 3.87–5.11)
RDW: 13.9 % (ref 11.5–15.5)
WBC: 13.1 10*3/uL — ABNORMAL HIGH (ref 4.0–10.5)
nRBC: 0 % (ref 0.0–0.2)

## 2018-02-01 LAB — COMPREHENSIVE METABOLIC PANEL
ALT: 16 U/L (ref 0–44)
AST: 19 U/L (ref 15–41)
Albumin: 4.4 g/dL (ref 3.5–5.0)
Alkaline Phosphatase: 100 U/L (ref 38–126)
Anion gap: 13 (ref 5–15)
BUN: 22 mg/dL (ref 8–23)
CO2: 20 mmol/L — ABNORMAL LOW (ref 22–32)
Calcium: 10.5 mg/dL — ABNORMAL HIGH (ref 8.9–10.3)
Chloride: 105 mmol/L (ref 98–111)
Creatinine, Ser: 0.92 mg/dL (ref 0.44–1.00)
GFR calc Af Amer: >60 mL/min (ref >60)
GFR calc non Af Amer: >60 mL/min (ref >60)
Glucose, Bld: 149 mg/dL — ABNORMAL HIGH (ref 70–99)
Potassium: 3.3 mmol/L — ABNORMAL LOW (ref 3.5–5.1)
Sodium: 138 mmol/L (ref 135–145)
Total Bilirubin: 0.9 mg/dL (ref 0.3–1.2)
Total Protein: 8.2 g/dL — ABNORMAL HIGH (ref 6.5–8.1)

## 2018-02-01 LAB — RAPID URINE DRUG SCREEN, HOSP PERFORMED
Amphetamines: NOT DETECTED
Barbiturates: NOT DETECTED
Benzodiazepines: POSITIVE — AB
Cocaine: NOT DETECTED
Opiates: NOT DETECTED
Tetrahydrocannabinol: NOT DETECTED

## 2018-02-01 LAB — CBC
HCT: 46.4 % — ABNORMAL HIGH (ref 36.0–46.0)
Hemoglobin: 15.2 g/dL — ABNORMAL HIGH (ref 12.0–15.0)
MCH: 27.9 pg (ref 26.0–34.0)
MCHC: 32.8 g/dL (ref 30.0–36.0)
MCV: 85.1 fL (ref 80.0–100.0)
Platelets: 504 10*3/uL — ABNORMAL HIGH (ref 150–400)
RBC: 5.45 MIL/uL — ABNORMAL HIGH (ref 3.87–5.11)
RDW: 13.5 % (ref 11.5–15.5)
WBC: 12.7 10*3/uL — ABNORMAL HIGH (ref 4.0–10.5)
nRBC: 0 % (ref 0.0–0.2)

## 2018-02-01 LAB — ETHANOL: Alcohol, Ethyl (B): <10 mg/dL (ref <10)

## 2018-02-01 LAB — TSH: TSH: 2.202 u[IU]/mL (ref 0.350–4.500)

## 2018-02-01 MED ORDER — OXCARBAZEPINE 300 MG PO TABS: 1200.0000 mg | ORAL_TABLET | Freq: Every day | ORAL | Status: DC

## 2018-02-01 MED ORDER — CALCIUM CARBONATE-VITAMIN D 500-200 MG-UNIT PO TABS
1.0000 | ORAL_TABLET | Freq: Every day | ORAL | Status: DC
  Administered 2018-02-02 – 2018-02-03 (×2): 1 via ORAL
  Filled 2018-02-01 (×2): qty 1

## 2018-02-01 MED ORDER — BUPROPION HCL ER (XL) 150 MG PO TB24
300.0000 mg | ORAL_TABLET | Freq: Every day | ORAL | Status: DC
  Administered 2018-02-02 – 2018-02-03 (×2): 300 mg via ORAL
  Filled 2018-02-01 (×2): qty 2

## 2018-02-01 MED ORDER — ALBUTEROL SULFATE HFA 108 (90 BASE) MCG/ACT IN AERS: 2.0000 | INHALATION_SPRAY | Freq: Four times a day (QID) | RESPIRATORY_TRACT | Status: DC | PRN

## 2018-02-01 MED ORDER — GABAPENTIN 300 MG PO CAPS
600.0000 mg | ORAL_CAPSULE | Freq: Three times a day (TID) | ORAL | Status: DC
  Administered 2018-02-01 – 2018-02-03 (×5): 600 mg via ORAL
  Filled 2018-02-01 (×5): qty 2

## 2018-02-01 MED ORDER — OXCARBAZEPINE 300 MG PO TABS
600.0000 mg | ORAL_TABLET | Freq: Two times a day (BID) | ORAL | Status: DC
  Administered 2018-02-01 – 2018-02-03 (×4): 600 mg via ORAL
  Filled 2018-02-01 (×4): qty 2

## 2018-02-01 MED ORDER — QUETIAPINE FUMARATE 100 MG PO TABS
100.0000 mg | ORAL_TABLET | Freq: Every day | ORAL | Status: DC
  Administered 2018-02-01 – 2018-02-02 (×2): 100 mg via ORAL
  Filled 2018-02-01 (×2): qty 1

## 2018-02-01 MED ORDER — LEVOTHYROXINE SODIUM 112 MCG PO TABS
112.0000 ug | ORAL_TABLET | Freq: Every day | ORAL | Status: DC
  Administered 2018-02-02 – 2018-02-03 (×2): 112 ug via ORAL
  Filled 2018-02-01 (×2): qty 1

## 2018-02-01 MED ORDER — QUETIAPINE FUMARATE 100 MG PO TABS: 200.0000 mg | ORAL_TABLET | Freq: Every day | ORAL | Status: DC

## 2018-02-01 MED ORDER — FLUOXETINE HCL 20 MG PO CAPS
40.0000 mg | ORAL_CAPSULE | Freq: Every day | ORAL | Status: DC
  Administered 2018-02-02 – 2018-02-03 (×2): 40 mg via ORAL
  Filled 2018-02-01 (×2): qty 2

## 2018-02-01 MED ORDER — CALCIUM CITRATE-VITAMIN D 315-200 MG-UNIT PO TABS: 1.0000 | ORAL_TABLET | Freq: Every day | ORAL | Status: DC

## 2018-02-01 MED ORDER — METHOCARBAMOL 500 MG PO TABS: 500.0000 mg | ORAL_TABLET | Freq: Three times a day (TID) | ORAL | Status: DC | PRN

## 2018-02-01 MED ORDER — FAMOTIDINE 20 MG PO TABS
20.0000 mg | ORAL_TABLET | Freq: Two times a day (BID) | ORAL | Status: DC
  Administered 2018-02-01 – 2018-02-03 (×4): 20 mg via ORAL
  Filled 2018-02-01 (×4): qty 1

## 2018-02-01 NOTE — ED Notes (Signed)
Bed: WA29 Expected date:  Expected time:  Means of arrival:  Comments: Hold for triage 4

## 2018-02-01 NOTE — Progress Notes (Signed)
02/01/18  1616  Labs drawn and sent to lab. Gold and light green tops sent to lab. Urine sent to lab.

## 2018-02-01 NOTE — [facility] Assessment (Signed)
[facility] Assessment Progress Note Case was staffed with Lord DNP who recommended a inpatient admission to assist with stabilization.       

## 2018-02-01 NOTE — ED Provider Notes (Signed)
[facility] DEPT Provider Note   CSN: [id number] Arrival date & time: 02/01/18  1302     History   Chief Complaint Chief Complaint  Patient presents with  . Medical Clearance    HPI Victoria Levine is a 67 y.o. female who presents for psych evaluation for bizarre behavior. PMH significant for bipolar d/p, asthma, allergies, hx of etoh abuse, chronic back pain. She is somewhat of a difficult historian because she is so tangential. Basically she states she did something she shouldn't have done and went to her ex-husbands house that she used to live in because she heard it was going to be sold and she wanted to see it. She told her ex-husband and his new wife that it looked good. For some reason she went back today and they brought her to the ED because she was "dressed inappropriately" and wearing the same clothes as yesterday which was shorts and a t-shirt. She also states she is upset because they told her that a friend had died from a heart attack but she found out that he had actually killed himself and it upsets her that they lied to her. She denies SI, HI, AVH. She denies drinking alcohol or any drug use. No other complaints - she feels ready to go home. She does admit to not taking her psychiatric medicines for some time because she forgot.  HPI  Past Medical History:  Diagnosis Date  . Anemia    low iron  . Anxiety   . Asthma   . Bipolar disorder ([place])   . Chronic back pain   . Compression fracture of L2 lumbar vertebra (HCC)   . DDD (degenerative disc disease)   . Depression   . Dyspnea   . GERD (gastroesophageal reflux disease)   . H/O alcohol abuse    28 years sober  . H/O suicide attempt   . Herpes   . History of hiatal hernia   . Hypothyroidism   . Migraines   . Osteoporosis 11/2013   T score -2.6 AP spine. 1 dose Reclast with reaction Prolia since 10/2011 most recent DEXA shows significant improvement at all measured sites.  .  Pneumonia   . S/P adenoidectomy   . S/P tonsillectomy     Patient Active Problem List   Diagnosis Date Noted  . Closed compression fracture of L2 vertebra ([place]) 12/11/2017  . Pain 07/26/2015  . Anxiety state 06/28/2015  . Benzodiazepine dependence ([place]) 10/13/2014  . Opioid abuse with opioid-induced mood disorder ([place]) 10/13/2014  . Bipolar 1 disorder, depressed ([place]) 10/12/2014  . Spondylolisthesis of lumbar region 07/28/2014  . Osteoporosis, unspecified 11/17/2013  . Migraines   . Herpes   . HYPOTHYROIDISM 08/13/2008  . HYPERCHOLESTEROLEMIA 08/13/2008  . BIPOLAR DISORDER UNSPECIFIED 08/13/2008  . CHRONIC OBSTRUCTIVE ASTHMA UNSPECIFIED 08/13/2008  . GASTROESOPHAGEAL REFLUX DISEASE 08/13/2008  . HIATAL HERNIA 08/13/2008  . ALLERGY 08/13/2008    Past Surgical History:  Procedure Laterality Date  . CESAREAN SECTION    . COLONOSCOPY     as a teenager  . Compression fracture spine    . DILATION AND CURETTAGE OF UTERUS    . EYE SURGERY Bilateral    CATARACTS REMOVED, NO LENS PLACED  . HYSTEROSCOPY    . KYPHOPLASTY N/A 12/19/2016   Procedure: THORACIC 12 KYPHOPLASTY; REQUEST 60 MINS AND FLIP ROOM;  Surgeon: Phylliss Bob, MD;  Location: [facility];  Service: Orthopedics;  Laterality: N/A;  THORACIC 12 KYPHOPLASTY; REQUEST 60 MINS AND  FLIP ROOM  . KYPHOPLASTY N/A 12/11/2017   Procedure: LUMBAR 2 KYPHOPLASTY;  Surgeon: Phylliss Bob, MD;  Location: [facility];  Service: Orthopedics;  Laterality: N/A;  . LUMBAR FUSION    . TONSILLECTOMY       OB History    Gravida  3   Para  1   Term  1   Preterm      AB  2   Living  1     SAB      TAB      Ectopic      Multiple      Live Births               Home Medications    Prior to Admission medications   Medication Sig Start Date End Date Taking? Authorizing Provider  acyclovir ointment (ZOVIRAX) 5 % Apply topically 3 (three) times daily. As needed. Patient not taking: Reported on 10/25/2017 05/01/17   [name],  NP  albuterol (PROVENTIL HFA;VENTOLIN HFA) 108 (90 Base) MCG/ACT inhaler Inhale 2 puffs into the lungs every 6 (six) hours as needed for wheezing or shortness of breath.    [provider]  buPROPion (WELLBUTRIN XL) 150 MG 24 hr tablet Take 300 mg by mouth daily.  10/01/16   [provider]  calcium citrate-vitamin D (CALCIUM + D) 315-200 MG-UNIT per tablet Take 1 tablet by mouth daily. For bone health 10/18/14   Lindell Spar I, NP  cyclobenzaprine (FLEXERIL) 10 MG tablet Take 10 mg by mouth 3 (three) times daily as needed for muscle spasms (scheduled every night at bedtime).    [provider]  denosumab (PROLIA) 60 MG/ML SOLN injection Inject 60 mg into the skin every 6 (six) months. Administer in upper arm, thigh, or abdomen  NEXT IS DUE 03-2017 Patient taking differently: Inject 60 mg into the skin every 6 (six) months. Administer in upper arm, thigh, or abdomen 03/05/17   [name], NP  FLUoxetine (PROZAC) 20 MG capsule Take 40 mg by mouth daily.     [provider]  gabapentin (NEURONTIN) 600 MG tablet Take 600 mg by mouth 3 (three) times daily.  11/05/16   [provider]  levothyroxine (SYNTHROID, LEVOTHROID) 112 MCG tablet Take 112 mcg by mouth daily before breakfast.  09/04/17   [provider]  methocarbamol (ROBAXIN) 500 MG tablet Take 500 mg by mouth 3 (three) times daily as needed for muscle spasms.  08/27/17   [provider]  Multiple Vitamin (MULTIVITAMIN) tablet Take 1 tablet by mouth daily. For low vitamin Patient not taking: Reported on 10/25/2017 10/18/14   Lindell Spar I, NP  Multiple Vitamins-Minerals (ADULT GUMMY) CHEW Chew 1 tablet by mouth daily.    [provider]  naproxen sodium (ALEVE) 220 MG tablet Take 220-440 mg by mouth 2 (two) times daily as needed (for pain).    [provider]  ondansetron (ZOFRAN) 8 MG tablet Take 8 mg by mouth every 8 (eight) hours as needed for nausea or vomiting  (Migraine Head Ache symptoms).    [provider]  oxcarbazepine (TRILEPTAL) 600 MG tablet Take 1,200 mg by mouth daily.     [provider]  OxyCODONE ER (XTAMPZA ER) 13.5 MG C12A Take 13.5 mg by mouth 2 (two) times daily. 11/22/16   [provider]  Oxycodone HCl 10 MG TABS Take 10 mg by mouth daily as needed (for breakthrough pain).  09/16/17   [provider]  QUEtiapine (  SEROQUEL) 100 MG tablet Take 200 mg by mouth at bedtime. 10/12/16   [provider]  ranitidine (ZANTAC) 300 MG tablet Take 300 mg by mouth at bedtime. 10/26/14   [provider]  rizatriptan (MAXALT-MLT) 10 MG disintegrating tablet Take 10 mg by mouth as needed for migraine.  11/08/14   [provider]  valACYclovir (VALTREX) 500 MG tablet Take one tablet twice daily 3-5 days then daily as needed. 05/01/17   [name], NP    Family History Family History  Adopted: Yes    Social History Social History   Tobacco Use  . Smoking status: Never Smoker  . Smokeless tobacco: Never Used  Substance Use Topics  . Alcohol use: No    Alcohol/week: 0.0 standard drinks    Comment: hx of heavy alcohol use - 28 years sober  . Drug use: Yes    Types: Benzodiazepines    Comment: hx of cocaine use in her 20's     Allergies   Prednisone; Lithium carbonate; and Sulfa antibiotics   Review of Systems Review of Systems  Constitutional: Negative for fever.  Respiratory: Negative for shortness of breath.   Cardiovascular: Negative for chest pain.  Gastrointestinal: Negative for abdominal pain.  Musculoskeletal: Negative for back pain.  Psychiatric/Behavioral: Positive for behavioral problems. Negative for dysphoric mood, hallucinations, self-injury and suicidal ideas.  All other systems reviewed and are negative.    Physical Exam Updated Vital Signs BP (!) 162/113   Pulse 98   Temp 98.1 F (36.7 C) (Oral)   Resp 16   Ht 5\' 6"  (1.676 m)   Wt 71.2 kg   SpO2  96%   BMI 25.34 kg/m   Physical Exam  Constitutional: She is oriented to person, place, and time. She appears well-developed and well-nourished. No distress.  Calm, cooperative, interactive  HENT:  Head: Normocephalic and atraumatic.  Eyes: Pupils are equal, round, and reactive to light. Conjunctivae are normal. Right eye exhibits no discharge. Left eye exhibits no discharge. No scleral icterus.  Neck: Normal range of motion.  Cardiovascular: Normal rate and regular rhythm.  Pulmonary/Chest: Effort normal and breath sounds normal. No respiratory distress.  Abdominal: Soft. Bowel sounds are normal. She exhibits no distension. There is no tenderness.  Neurological: She is alert and oriented to person, place, and time.  Skin: Skin is warm and dry.  Psychiatric: She has a normal mood and affect. Her behavior is normal. Her speech is tangential. Cognition and memory are normal. She expresses impulsivity. She expresses no homicidal and no suicidal ideation. She expresses no suicidal plans and no homicidal plans.  Nursing note and vitals reviewed.    ED Treatments / Results  Labs (all labs ordered are listed, but only abnormal results are displayed) Labs Reviewed  COMPREHENSIVE METABOLIC PANEL - Abnormal; Notable for the following components:      Result Value   Potassium 3.3 (*)    CO2 20 (*)    Glucose, Bld 149 (*)    Calcium 10.5 (*)    Total Protein 8.2 (*)    All other components within normal limits  CBC - Abnormal; Notable for the following components:   WBC 12.7 (*)    RBC 5.45 (*)    Hemoglobin 15.2 (*)    HCT 46.4 (*)    Platelets 504 (*)    All other components within normal limits  RAPID URINE DRUG SCREEN, HOSP PERFORMED - Abnormal; Notable for the following components:   Benzodiazepines POSITIVE (*)  All other components within normal limits  URINALYSIS, ROUTINE W REFLEX MICROSCOPIC - Abnormal; Notable for the following components:   Color, Urine AMBER (*)     APPearance HAZY (*)    Specific Gravity, Urine 1.031 (*)    Ketones, ur 20 (*)    Protein, ur 100 (*)    Leukocytes, UA MODERATE (*)    Bacteria, UA RARE (*)    All other components within normal limits  CBC WITH DIFFERENTIAL/PLATELET - Abnormal; Notable for the following components:   WBC 13.1 (*)    RBC 5.29 (*)    Hemoglobin 15.2 (*)    HCT 46.2 (*)    Platelets 476 (*)    Neutro Abs 10.2 (*)    All other components within normal limits  BASIC METABOLIC PANEL - Abnormal; Notable for the following components:   Potassium 3.1 (*)    CO2 21 (*)    Glucose, Bld 108 (*)    Calcium 10.5 (*)    All other components within normal limits  ETHANOL  TSH    EKG None  Radiology No results found.  Procedures Procedures (including critical care time)  Medications Ordered in ED Medications  albuterol (PROVENTIL HFA;VENTOLIN HFA) 108 (90 Base) MCG/ACT inhaler 2 puff (has no administration in time range)  buPROPion (WELLBUTRIN XL) 24 hr tablet 300 mg (has no administration in time range)  FLUoxetine (PROZAC) capsule 40 mg (has no administration in time range)  gabapentin (NEURONTIN) capsule 600 mg (has no administration in time range)  levothyroxine (SYNTHROID, LEVOTHROID) tablet 112 mcg (has no administration in time range)  methocarbamol (ROBAXIN) tablet 500 mg (has no administration in time range)  famotidine (PEPCID) tablet 20 mg (has no administration in time range)  Oxcarbazepine (TRILEPTAL) tablet 600 mg (has no administration in time range)  QUEtiapine (SEROQUEL) tablet 100 mg (has no administration in time range)  calcium-vitamin D (OSCAL WITH D) 500-200 MG-UNIT per tablet 1 tablet (has no administration in time range)     Initial Impression / Assessment and Plan / ED Course  I have reviewed the triage vital signs and the nursing notes.  Pertinent labs & imaging results that were available during my care of the patient were reviewed by me and considered in my medical  decision making (see chart for details).  67 year old female presents for psych evaluation after acting strangely today. She is hypertensive but otherwise vitals are normal. She has pancytosis on her CBC suggesting maybe mild dehydration. Kidney function is normal. She is calm and cooperative on exam. She has an odd affect but does not seem acutely psychotic. Will have her evaluated by TTS. She is medically cleared.  Final Clinical Impressions(s) / ED Diagnoses   Final diagnoses:  Behavior problem, adult    ED Discharge Orders    None       Recardo Evangelist, PA-C 02/01/18 2019    Virgel Manifold, MD 02/02/18 [phone]

## 2018-02-01 NOTE — ED Notes (Signed)
[employer]- Ex-husband states that the patient does not have family in the area and he has her keys to her house. (Patient left them in his car) Call when needed.

## 2018-02-01 NOTE — ED Triage Notes (Signed)
Patient's ex-husband brought patient in to the ED for medical clearance. Ex-husband stated that he has not seen the ex-wife in 10 years and she showed up at his house acting bizarre. Patient states she drove to his house. Patient and ex-husband's daughter called her father and reported that the patient drinks alcohol and possibly taking opiates.  Patient denies any drug use or alcohol use. Paitent denies visual or auditory hallucinations. Patient's ex-husband states she has visual hallucinations and is confused. Paitent accused ex-husband of having a cat that killed her cat, etc.  Patient denies SI/HI.

## 2018-02-01 NOTE — BH Assessment (Addendum)
Assessment Note  Victoria Levine is an 67 y.o. female that presents this date with altered mental state. Patient denies any S/I, H/I or AVH. Patient is observed to be very disorganized and is not oriented to time and just states "in a hospital" when asked in reference to place. Patient reports she was diagnosed with depression over 30 years ago at the [facility] and has been receiving services in the form of medication management since that diagnoses. Patient renders conflicting history and a accurate account of what happened earlier this date is unclear. Patient reports that she has been residing in a private residence that is her home she acquired after her and her husband divorced ten years ago. Although per note review patient had been staying in that residence without the owners permission (her ex-husband) who presented earlier this date to show that property to a prospective buyer and found that patient had been residing there. Patient reports she had a "alcohol problem" thirty years ago and has been maintaining her sobriety since then denying any current use. Patient reports she has been off her medication/s for two days due to them "rolling around in the back of her car." Patient will not elaborate on why she was unable to retrieve her medication from her car. Patient is observed to be very liable and is tearful at time. Patient denies any previous/attempts gestures at self harm. Per notes, "Patient's ex-husband brought patient in to the ED for psychiatric evaluation. Ex-husband stated that he has not seen the ex-wife in 10 years and she showed up at his house acting bizarre. Patient states she drove to his house. Patient and ex-husband's daughter called her father and reported that the patient drinks alcohol and possibly taking opiates. Patient's UDS is pending at this time. Patient denies any drug use or alcohol use. Patient denies visual or auditory hallucinations. Patient's  ex-husband states she has visual hallucinations and is confused. Patient accused ex-husband of having a cat that killed her cat, etc. Case was staffed with Reita Cliche DNP who recommended a inpatient admission to assist with stabilization.    Diagnosis: F33.3 MDD, recurrent episode, With psychotic features   Past Medical History:  Past Medical History:  Diagnosis Date  . Anemia    low iron  . Anxiety   . Asthma   . Bipolar disorder ([place])   . Chronic back pain   . Compression fracture of L2 lumbar vertebra (HCC)   . DDD (degenerative disc disease)   . Depression   . Dyspnea   . GERD (gastroesophageal reflux disease)   . H/O alcohol abuse    28 years sober  . H/O suicide attempt   . Herpes   . History of hiatal hernia   . Hypothyroidism   . Migraines   . Osteoporosis 11/2013   T score -2.6 AP spine. 1 dose Reclast with reaction Prolia since 10/2011 most recent DEXA shows significant improvement at all measured sites.  . Pneumonia   . S/P adenoidectomy   . S/P tonsillectomy     Past Surgical History:  Procedure Laterality Date  . CESAREAN SECTION    . COLONOSCOPY     as a teenager  . Compression fracture spine    . DILATION AND CURETTAGE OF UTERUS    . EYE SURGERY Bilateral    CATARACTS REMOVED, NO LENS PLACED  . HYSTEROSCOPY    . KYPHOPLASTY N/A 12/19/2016   Procedure: THORACIC 12 KYPHOPLASTY; REQUEST 60 MINS AND FLIP ROOM;  Surgeon:  Phylliss Bob, MD;  Location: [facility];  Service: Orthopedics;  Laterality: N/A;  THORACIC 12 KYPHOPLASTY; REQUEST 60 MINS AND FLIP ROOM  . KYPHOPLASTY N/A 12/11/2017   Procedure: LUMBAR 2 KYPHOPLASTY;  Surgeon: Phylliss Bob, MD;  Location: [facility];  Service: Orthopedics;  Laterality: N/A;  . LUMBAR FUSION    . TONSILLECTOMY      Family History:  Family History  Adopted: Yes    Social History:  reports that she has never smoked. She has never used smokeless tobacco. She reports that she has current or past drug history. Drug: Benzodiazepines. She  reports that she does not drink alcohol.  Additional Social History:  Alcohol / Drug Use Pain Medications: See MAR Prescriptions: See MAR Over the Counter: See MAR History of alcohol / drug use?: Yes Longest period of sobriety (when/how long): 27 years Negative Consequences of Use: (NA) Withdrawal Symptoms: (NA) Substance #1 Name of Substance 1: Alcohol 1 - Age of First Use: 28 1 - Amount (size/oz): Pt cannot recall 1 - Frequency: Pt cannot recall 1 - Duration: Pt cannot recall 1 - Last Use / Amount: Pt cannot recall been maintaining sobriety for 25 years  CIWA: CIWA-Ar BP: (!) 162/113 Pulse Rate: 98 Nausea and Vomiting: mild nausea with no vomiting Tactile Disturbances: none Tremor: no tremor Auditory Disturbances: not present Paroxysmal Sweats: no sweat visible Visual Disturbances: not present Anxiety: moderately anxious, or guarded, so anxiety is inferred Headache, Fullness in Head: mild Agitation: normal activity Orientation and Clouding of Sensorium: oriented and can do serial additions CIWA-Ar Total: 7 COWS:    Allergies:  Allergies  Allergen Reactions  . Prednisone Other (See Comments)    Cannot tolerate ORALLY, but can tolerate by shot (interacts with anxiety & bipolarity)  . Lithium Carbonate Other (See Comments)    Patient was taken off of this because of a weight gain of 40 pounds  . Sulfa Antibiotics Other (See Comments)    From childhood; reaction not recalled    Home Medications:  (Not in a hospital admission)  OB/GYN Status:  No LMP recorded. Patient is postmenopausal.  General Assessment Data Location of Assessment: [facility] TTS Assessment: In system Is this a Tele or Face-to-Face Assessment?: Face-to-Face Is this an Initial Assessment or a Re-assessment for this encounter?: Initial Assessment Patient Accompanied by:: (NA) Language Other than English: No Living Arrangements: (Alone) What gender do you identify as?: Female Marital status:  Divorced Maiden name: Copenhaver Pregnancy Status: No Living Arrangements: Alone Can pt return to current living arrangement?: Yes Admission Status: Voluntary Is patient capable of signing voluntary admission?: Yes Referral Source: Self/Family/Friend Insurance type: Medicare  Medical Screening Exam ([place]) Medical Exam completed: Yes  Crisis Care Plan Living Arrangements: Alone Legal Guardian: (NA) Name of Psychiatrist: [facility] Name of Therapist: [facility]  Education Status Is patient currently in school?: No Is the patient employed, unemployed or receiving disability?: Receiving disability income  Risk to self with the past 6 months Suicidal Ideation: No Has patient been a risk to self within the past 6 months prior to admission? : No Suicidal Intent: No Has patient had any suicidal intent within the past 6 months prior to admission? : No Is patient at risk for suicide?: No Suicidal Plan?: No Has patient had any suicidal plan within the past 6 months prior to admission? : No Access to Means: No What has been your use of drugs/alcohol within the last 12 months?: NA Previous Attempts/Gestures: No How many times?:  0 Other Self Harm Risks: NA Triggers for Past Attempts: Unknown Intentional Self Injurious Behavior: None Family Suicide History: No Recent stressful life event(s): Other (Comment) Persecutory voices/beliefs?: No Depression: Yes Depression Symptoms: Feeling worthless/self pity Substance abuse history and/or treatment for substance abuse?: Yes Suicide prevention information given to non-admitted patients: Not applicable  Risk to Others within the past 6 months Homicidal Ideation: No Does patient have any lifetime risk of violence toward others beyond the six months prior to admission? : No Thoughts of Harm to Others: No Current Homicidal Intent: No Current Homicidal Plan: No Access to Homicidal Means: No Identified  Victim: NA History of harm to others?: No Assessment of Violence: None Noted Violent Behavior Description: NA Does patient have access to weapons?: No Criminal Charges Pending?: No Does patient have a court date: No Is patient on probation?: No  Psychosis Hallucinations: None noted Delusions: None noted  Mental Status Report Appearance/Hygiene: In scrubs Eye Contact: Fair Motor Activity: Freedom of movement Speech: Tangential Level of Consciousness: Quiet/awake Mood: Sad Affect: Flat Anxiety Level: Moderate Thought Processes: Thought Blocking Judgement: Partial Orientation: Not oriented Obsessive Compulsive Thoughts/Behaviors: None  Cognitive Functioning Concentration: Decreased Memory: Recent Impaired Is patient IDD: No Insight: Poor Impulse Control: Poor Appetite: Fair Have you had any weight changes? : No Change Sleep: Decreased Total Hours of Sleep: 5 Vegetative Symptoms: None  ADLScreening [facility] Assessment Services) Patient's cognitive ability adequate to safely complete daily activities?: Yes Patient able to express need for assistance with ADLs?: Yes Independently performs ADLs?: Yes (appropriate for developmental age)  Prior Inpatient Therapy Prior Inpatient Therapy: No  Prior Outpatient Therapy Prior Outpatient Therapy: Yes Prior Therapy Dates: Ongoing Prior Therapy Facilty/Provider(s): [facility] Reason for Treatment: Med mang Does patient have an ACCT team?: No Does patient have Intensive In-House Services?  : No Does patient have [facility] services? : No Does patient have P4CC services?: No  ADL Screening (condition at time of admission) Patient's cognitive ability adequate to safely complete daily activities?: Yes Is the patient deaf or have difficulty hearing?: No Does the patient have difficulty seeing, even when wearing glasses/contacts?: No Does the patient have difficulty concentrating, remembering, or making decisions?:  Yes Patient able to express need for assistance with ADLs?: Yes Does the patient have difficulty dressing or bathing?: No Independently performs ADLs?: Yes (appropriate for developmental age) Does the patient have difficulty walking or climbing stairs?: No Weakness of Legs: None Weakness of Arms/Hands: None  Home Assistive Devices/Equipment Home Assistive Devices/Equipment: None  Therapy Consults (therapy consults require a physician order) PT Evaluation Needed: No OT Evalulation Needed: No SLP Evaluation Needed: No Abuse/Neglect Assessment (Assessment to be complete while patient is alone) Physical Abuse: Denies Verbal Abuse: Denies Sexual Abuse: Denies Exploitation of patient/patient's resources: Denies Self-Neglect: Denies Values / Beliefs Cultural Requests During Hospitalization: None Spiritual Requests During Hospitalization: None Consults Spiritual Care Consult Needed: No Social Work Consult Needed: No Regulatory affairs officer (For Healthcare) Does Patient Have a Medical Advance Directive?: No Would patient like information on creating a medical advance directive?: No - Patient declined Nutrition Screen- [facility] Patient's home diet: Regular        Disposition: Case was staffed with Reita Cliche DNP who recommended a inpatient admission to assist with stabilization.    Disposition Initial Assessment Completed for this Encounter: Yes  On Site Evaluation by:   Reviewed with Physician:    Mamie Nick 02/01/2018 4:54 PM

## 2018-02-02 DIAGNOSIS — F319 Bipolar disorder, unspecified: Secondary | ICD-10-CM

## 2018-02-02 MED ORDER — CEPHALEXIN 500 MG PO CAPS
500.0000 mg | ORAL_CAPSULE | Freq: Three times a day (TID) | ORAL | Status: DC
  Administered 2018-02-02 – 2018-02-03 (×4): 500 mg via ORAL
  Filled 2018-02-02 (×4): qty 1

## 2018-02-02 MED ORDER — ACETAMINOPHEN 325 MG PO TABS
650.0000 mg | ORAL_TABLET | Freq: Once | ORAL | Status: AC
  Administered 2018-02-02: 650 mg via ORAL
  Filled 2018-02-02: qty 2

## 2018-02-02 NOTE — ED Notes (Signed)
I have just rec'd. IVC paperwork from our social worker. I also have just left a message with Sgt. Pascal at [employer] transport @[phone]  [phone] to have pt. Transported to [facility] in [place], [place].

## 2018-02-02 NOTE — ED Notes (Signed)
She is awake and alert and tells me she is beginning to "get a migraine". Dr. Ronnald Nian notified, and order rec'd. For Tylenol.

## 2018-02-02 NOTE — Progress Notes (Signed)
Patient has been offered a bed at [facility]. Patient will need to be IVC'd first, ED psychiatrist working to complete IVC at this time.  CSW to fax completed IVC to [phone]  Patient will require sheriff transportation.  RN Call for report: [place], [place] Worker [phone]

## 2018-02-02 NOTE — Progress Notes (Signed)
02/02/18  1259 Waiting to get IVC papers signed by MD.

## 2018-02-02 NOTE — Progress Notes (Signed)
Patient has been offered a bed at [facility] hospital for a gero-psych admission. Patient will need completed IVC prior to transport.  CSW completed demographic information for affidavit and petition and first opinion. ED Psychiatrist has completed inpatient psychiatric criteria for IVC paperwork, IVC requires a physician to sign the health screening component.   CSW following for disposition.   [name], [place] Social Worker [phone]

## 2018-02-02 NOTE — Progress Notes (Addendum)
This patient continues to meet inpatient criteria. CSW faxed information to the following facilities:   [facility]- under review  Patient is voluntary and appropriate for gero-psych placement at this time.    [name], [place] Social Worker [phone]

## 2018-02-02 NOTE — Consult Note (Addendum)
[facility] Psychiatry Consult   Reason for Consult:  ED follow up evaluation Referring Physician:  EDP Patient Identification: Victoria Levine MRN:  [id number] Principal Diagnosis: Bipolar 1 disorder, depressed ([place]) Diagnosis:   Patient Active Problem List   Diagnosis Date Noted  . Bipolar 1 disorder, depressed ([place]) [F31.9] 10/12/2014    Priority: High  . Closed compression fracture of L2 vertebra ([place]) [S32.020A] 12/11/2017  . Pain [R52] 07/26/2015  . Anxiety state [F41.1] 06/28/2015  . Benzodiazepine dependence ([place]) [F13.20] 10/13/2014  . Opioid abuse with opioid-induced mood disorder ([place]) [F11.14] 10/13/2014  . Spondylolisthesis of lumbar region [M43.16] 07/28/2014  . Osteoporosis, unspecified [M81.0] 11/17/2013  . Migraines [G43.909]   . Herpes [B00.9]   . HYPOTHYROIDISM [E03.9] 08/13/2008  . HYPERCHOLESTEROLEMIA [E78.00] 08/13/2008  . BIPOLAR DISORDER UNSPECIFIED [F31.9] 08/13/2008  . CHRONIC OBSTRUCTIVE ASTHMA UNSPECIFIED [J44.9] 08/13/2008  . GASTROESOPHAGEAL REFLUX DISEASE [K21.9] 08/13/2008  . HIATAL HERNIA [K44.9] 08/13/2008  . ALLERGY [T78.40XA] 08/13/2008    Total Time spent with patient: 15 minutes  Subjective:   Victoria Levine is a 67 y.o. female patient admitted for evaluation due to change in mental status and bizarre behaviors.   HPI:  Today, patient continues to be oriented to person, but disoriented to time and situation. She denied SI. She has remained cooperative with staff and compliant with therapy. UA positive for infection and labs show leukocytosis.   Per the initial ED note: Patient's ex-husband brought patient in to the ED for psychiatric evaluation. Ex-husband stated that he has not seen the ex-wife in 10 years and she showed up at his house acting bizarre. Patient states she drove to his house. Patient and ex-husband's daughter called her father and reported that the patient drinks alcohol and possibly taking opiates. Patient reported  that she had not taken her medications x 2 days. UDS clear except for benzodiazepines.  Her symptoms appear to have started a few days ago and increased with the UTI, better with medications.  Symptoms are consistent, tangential and difficult to redirect her conversation.  Past Psychiatric History: Patient reports she was diagnosed with depression over 30 years ago at the [facility] and has been receiving services in the form of medication management since that diagnoses. However, patient is not a good historian. Pet patient chart, she has h/o alcohol abuse and suicide attempt.   Risk to Self: Suicidal Ideation: No Suicidal Intent: No Is patient at risk for suicide?: No Suicidal Plan?: No Access to Means: No What has been your use of drugs/alcohol within the last 12 months?: NA How many times?: 0 Other Self Harm Risks: NA Triggers for Past Attempts: Unknown Intentional Self Injurious Behavior: None Risk to Others: Homicidal Ideation: No Thoughts of Harm to Others: No Current Homicidal Intent: No Current Homicidal Plan: No Access to Homicidal Means: No Identified Victim: NA History of harm to others?: No Assessment of Violence: None Noted Violent Behavior Description: NA Does patient have access to weapons?: No Criminal Charges Pending?: No Does patient have a court date: No Prior Inpatient Therapy: Prior Inpatient Therapy: No Prior Outpatient Therapy: Prior Outpatient Therapy: Yes Prior Therapy Dates: Ongoing Prior Therapy Facilty/Provider(s): [facility] Reason for Treatment: Med mang Does patient have an ACCT team?: No Does patient have Intensive In-House Services?  : No Does patient have [facility] services? : No Does patient have P4CC services?: No  Past Medical History:  Past Medical History:  Diagnosis Date  . Anemia    low iron  .  Anxiety   . Asthma   . Bipolar disorder ([place])   . Chronic back pain   . Compression fracture of L2 lumbar  vertebra ([facility])   . DDD (degenerative disc disease)   . Depression   . Dyspnea   . GERD (gastroesophageal reflux disease)   . H/O alcohol abuse    28 years sober  . H/O suicide attempt   . Herpes   . History of hiatal hernia   . Hypothyroidism   . Migraines   . Osteoporosis 11/2013   T score -2.6 AP spine. 1 dose Reclast with reaction Prolia since 10/2011 most recent DEXA shows significant improvement at all measured sites.  . Pneumonia   . S/P adenoidectomy   . S/P tonsillectomy     Past Surgical History:  Procedure Laterality Date  . CESAREAN SECTION    . COLONOSCOPY     as a teenager  . Compression fracture spine    . DILATION AND CURETTAGE OF UTERUS    . EYE SURGERY Bilateral    CATARACTS REMOVED, NO LENS PLACED  . HYSTEROSCOPY    . KYPHOPLASTY N/A 12/19/2016   Procedure: THORACIC 12 KYPHOPLASTY; REQUEST 60 MINS AND FLIP ROOM;  Surgeon: Phylliss Bob, MD;  Location: [facility];  Service: Orthopedics;  Laterality: N/A;  THORACIC 12 KYPHOPLASTY; REQUEST 60 MINS AND FLIP ROOM  . KYPHOPLASTY N/A 12/11/2017   Procedure: LUMBAR 2 KYPHOPLASTY;  Surgeon: Phylliss Bob, MD;  Location: [facility];  Service: Orthopedics;  Laterality: N/A;  . LUMBAR FUSION    . TONSILLECTOMY     Family History:  Family History  Adopted: Yes   Family Psychiatric  History: none Social History:  Social History   Substance and Sexual Activity  Alcohol Use No  . Alcohol/week: 0.0 standard drinks   Comment: hx of heavy alcohol use - 28 years sober     Social History   Substance and Sexual Activity  Drug Use Yes  . Types: Benzodiazepines   Comment: hx of cocaine use in her 38's    Social History   Socioeconomic History  . Marital status: Single    Spouse name: Not on file  . Number of children: Not on file  . Years of education: Not on file  . Highest education level: Not on file  Occupational History  . Not on file  Social Needs  . Financial resource strain: Not on file  . Food insecurity:     Worry: Not on file    Inability: Not on file  . Transportation needs:    Medical: Not on file    Non-medical: Not on file  Tobacco Use  . Smoking status: Never Smoker  . Smokeless tobacco: Never Used  Substance and Sexual Activity  . Alcohol use: No    Alcohol/week: 0.0 standard drinks    Comment: hx of heavy alcohol use - 28 years sober  . Drug use: Yes    Types: Benzodiazepines    Comment: hx of cocaine use in her 20's  . Sexual activity: Not Currently    Birth control/protection: Post-menopausal    Comment: intercourse age 64, sexual partners less than 5  Lifestyle  . Physical activity:    Days per week: Not on file    Minutes per session: Not on file  . Stress: Not on file  Relationships  . Social connections:    Talks on phone: Not on file    Gets together: Not on file    Attends religious service:  Not on file    Active member of club or organization: Not on file    Attends meetings of clubs or organizations: Not on file    Relationship status: Not on file  Other Topics Concern  . Not on file  Social History Narrative  . Not on file   Additional Social History:    Allergies:   Allergies  Allergen Reactions  . Prednisone Other (See Comments)    Cannot tolerate ORALLY, but can tolerate by shot (interacts with anxiety & bipolarity)  . Lithium Carbonate Other (See Comments)    Patient was taken off of this because of a weight gain of 40 pounds  . Sulfa Antibiotics Other (See Comments)    From childhood; reaction not recalled    Labs:  Results for orders placed or performed during the hospital encounter of 02/01/18 (from the past 48 hour(s))  Comprehensive metabolic panel     Status: Abnormal   Collection Time: 02/01/18  1:27 PM  Result Value Ref Range   Sodium 138 135 - 145 mmol/L   Potassium 3.3 (L) 3.5 - 5.1 mmol/L   Chloride 105 98 - 111 mmol/L   CO2 20 (L) 22 - 32 mmol/L   Glucose, Bld 149 (H) 70 - 99 mg/dL   BUN 22 8 - 23 mg/dL   Creatinine, Ser  0.92 0.44 - 1.00 mg/dL   Calcium 10.5 (H) 8.9 - 10.3 mg/dL   Total Protein 8.2 (H) 6.5 - 8.1 g/dL   Albumin 4.4 3.5 - 5.0 g/dL   AST 19 15 - 41 U/L   ALT 16 0 - 44 U/L   Alkaline Phosphatase 100 38 - 126 U/L   Total Bilirubin 0.9 0.3 - 1.2 mg/dL   GFR calc non Af Amer >60 >60 mL/min   GFR calc Af Amer >60 >60 mL/min    Comment: (NOTE) The eGFR has been calculated using the CKD EPI equation. This calculation has not been validated in all clinical situations. eGFR's persistently <60 mL/min signify possible Chronic Kidney Disease.    Anion gap 13 5 - 15    Comment: Performed at [facility], [place] [address], [place], [place] [address]  Ethanol     Status: None   Collection Time: 02/01/18  1:27 PM  Result Value Ref Range   Alcohol, Ethyl (B) <10 <10 mg/dL    Comment: (NOTE) Lowest detectable limit for serum alcohol is 10 mg/dL. For medical purposes only. Performed at [facility], [place] [address], [place], [place] [address]   cbc     Status: Abnormal   Collection Time: 02/01/18  1:27 PM  Result Value Ref Range   WBC 12.7 (H) 4.0 - 10.5 K/uL   RBC 5.45 (H) 3.87 - 5.11 MIL/uL   Hemoglobin 15.2 (H) 12.0 - 15.0 g/dL   HCT 46.4 (H) 36.0 - 46.0 %   MCV 85.1 80.0 - 100.0 fL   MCH 27.9 26.0 - 34.0 pg   MCHC 32.8 30.0 - 36.0 g/dL   RDW 13.5 11.5 - 15.5 %   Platelets 504 (H) 150 - 400 K/uL   nRBC 0.0 0.0 - 0.2 %    Comment: Performed at [facility], [place] [address] [place], [place] [address]  Rapid urine drug screen (hospital performed)     Status: Abnormal   Collection Time: 02/01/18  1:27 PM  Result Value Ref Range   Opiates NONE DETECTED NONE DETECTED   Cocaine NONE DETECTED NONE DETECTED  Benzodiazepines POSITIVE (A) NONE DETECTED   Amphetamines NONE DETECTED NONE DETECTED   Tetrahydrocannabinol NONE DETECTED NONE DETECTED   Barbiturates NONE DETECTED NONE DETECTED    Comment: (NOTE) DRUG SCREEN FOR MEDICAL  PURPOSES ONLY.  IF CONFIRMATION IS NEEDED FOR ANY PURPOSE, NOTIFY LAB WITHIN 5 DAYS. LOWEST DETECTABLE LIMITS FOR URINE DRUG SCREEN Drug Class                     Cutoff (ng/mL) Amphetamine and metabolites    1000 Barbiturate and metabolites    200 Benzodiazepine                 166 Tricyclics and metabolites     300 Opiates and metabolites        300 Cocaine and metabolites        300 THC                            50 Performed at [facility], [place] [address] [place], [place] [address]   CBC with Differential/Platelet     Status: Abnormal   Collection Time: 02/01/18  2:00 PM  Result Value Ref Range   WBC 13.1 (H) 4.0 - 10.5 K/uL   RBC 5.29 (H) 3.87 - 5.11 MIL/uL   Hemoglobin 15.2 (H) 12.0 - 15.0 g/dL   HCT 46.2 (H) 36.0 - 46.0 %   MCV 87.3 80.0 - 100.0 fL   MCH 28.7 26.0 - 34.0 pg   MCHC 32.9 30.0 - 36.0 g/dL   RDW 13.9 11.5 - 15.5 %   Platelets 476 (H) 150 - 400 K/uL   nRBC 0.0 0.0 - 0.2 %   Neutrophils Relative % 78 %   Neutro Abs 10.2 (H) 1.7 - 7.7 K/uL   Lymphocytes Relative 15 %   Lymphs Abs 2.0 0.7 - 4.0 K/uL   Monocytes Relative 6 %   Monocytes Absolute 0.8 0.1 - 1.0 K/uL   Eosinophils Relative 0 %   Eosinophils Absolute 0.0 0.0 - 0.5 K/uL   Basophils Relative 0 %   Basophils Absolute 0.1 0.0 - 0.1 K/uL   Immature Granulocytes 1 %   Abs Immature Granulocytes 0.07 0.00 - 0.07 K/uL    Comment: Performed at [facility], [place] [address] [place], [place] [address]  Urinalysis, Routine w reflex microscopic     Status: Abnormal   Collection Time: 02/01/18  4:06 PM  Result Value Ref Range   Color, Urine AMBER (A) YELLOW    Comment: BIOCHEMICALS MAY BE AFFECTED BY COLOR   APPearance HAZY (A) CLEAR   Specific Gravity, Urine 1.031 (H) 1.005 - 1.030   pH 5.0 5.0 - 8.0   Glucose, UA NEGATIVE NEGATIVE mg/dL   Hgb urine dipstick NEGATIVE NEGATIVE   Bilirubin Urine NEGATIVE NEGATIVE   Ketones, ur 20 (A) NEGATIVE mg/dL   Protein,  ur 100 (A) NEGATIVE mg/dL   Nitrite NEGATIVE NEGATIVE   Leukocytes, UA MODERATE (A) NEGATIVE   RBC / HPF 6-10 0 - 5 RBC/hpf   WBC, UA 11-20 0 - 5 WBC/hpf   Bacteria, UA RARE (A) NONE SEEN   Squamous Epithelial / LPF 0-5 0 - 5   Mucus PRESENT     Comment: Performed at [facility], [place] [address] [place], [place] [address]  TSH     Status: None   Collection Time: 02/01/18  4:06 PM  Result Value Ref Range  TSH 2.202 0.350 - 4.500 uIU/mL    Comment: Performed by a 3rd Generation assay with a functional sensitivity of <=0.01 uIU/mL. Performed at [facility], [place] [address], [place], [place] [address]   Basic metabolic panel     Status: Abnormal   Collection Time: 02/01/18  4:06 PM  Result Value Ref Range   Sodium 138 135 - 145 mmol/L   Potassium 3.1 (L) 3.5 - 5.1 mmol/L   Chloride 104 98 - 111 mmol/L   CO2 21 (L) 22 - 32 mmol/L   Glucose, Bld 108 (H) 70 - 99 mg/dL   BUN 21 8 - 23 mg/dL   Creatinine, Ser 0.82 0.44 - 1.00 mg/dL   Calcium 10.5 (H) 8.9 - 10.3 mg/dL   GFR calc non Af Amer >60 >60 mL/min   GFR calc Af Amer >60 >60 mL/min    Comment: (NOTE) The eGFR has been calculated using the CKD EPI equation. This calculation has not been validated in all clinical situations. eGFR's persistently <60 mL/min signify possible Chronic Kidney Disease.    Anion gap 13 5 - 15    Comment: Performed at [facility], [place] [address], [place], [place] [address]    Current Facility-Administered Medications  Medication Dose Route Frequency Provider Last Rate Last Dose  . albuterol (PROVENTIL HFA;VENTOLIN HFA) 108 (90 Base) MCG/ACT inhaler 2 puff  2 puff Inhalation Q6H PRN Patrecia Pour, NP      . buPROPion (WELLBUTRIN XL) 24 hr tablet 300 mg  300 mg Oral Daily Patrecia Pour, NP   300 mg at 02/02/18 0955  . calcium-vitamin D (OSCAL WITH D) 500-200 MG-UNIT per tablet 1 tablet  1 tablet Oral Q breakfast Virgel Manifold, MD   1 tablet  at 02/02/18 0900  . cephALEXin (KEFLEX) capsule 500 mg  500 mg Oral Q8H Patrecia Pour, NP   500 mg at 02/02/18 0954  . famotidine (PEPCID) tablet 20 mg  20 mg Oral BID Patrecia Pour, NP   20 mg at 02/02/18 0954  . FLUoxetine (PROZAC) capsule 40 mg  40 mg Oral Daily Patrecia Pour, NP   40 mg at 02/02/18 0955  . gabapentin (NEURONTIN) capsule 600 mg  600 mg Oral TID Patrecia Pour, NP   600 mg at 02/02/18 0954  . levothyroxine (SYNTHROID, LEVOTHROID) tablet 112 mcg  112 mcg Oral Q0600 Patrecia Pour, NP   112 mcg at 02/02/18 7588  . methocarbamol (ROBAXIN) tablet 500 mg  500 mg Oral TID PRN Patrecia Pour, NP      . Oxcarbazepine (TRILEPTAL) tablet 600 mg  600 mg Oral BID Patrecia Pour, NP   600 mg at 02/02/18 0954  . QUEtiapine (SEROQUEL) tablet 100 mg  100 mg Oral QHS Patrecia Pour, NP   100 mg at 02/01/18 2200   Current Outpatient Medications  Medication Sig Dispense Refill  . buPROPion (WELLBUTRIN XL) 150 MG 24 hr tablet Take 300 mg by mouth daily.     . diazepam (VALIUM) 5 MG tablet Take 5 mg by mouth every 6 (six) hours as needed for muscle spasms.    Marland Kitchen FLUoxetine (PROZAC) 20 MG capsule Take 20 mg by mouth 2 (two) times daily.     Marland Kitchen gabapentin (NEURONTIN) 600 MG tablet Take 600 mg by mouth 3 (three) times daily.     Marland Kitchen levothyroxine (SYNTHROID, LEVOTHROID) 112 MCG tablet Take 112 mcg by mouth daily before breakfast.   3  .  methocarbamol (ROBAXIN) 500 MG tablet Take 500 mg by mouth 3 (three) times daily.   5  . oxcarbazepine (TRILEPTAL) 600 MG tablet Take 600 mg by mouth 2 (two) times daily.     . OxyCODONE ER (XTAMPZA ER) 13.5 MG C12A Take 13.5 mg by mouth 2 (two) times daily.    . Oxycodone HCl 10 MG TABS Take 10 mg by mouth daily as needed (for breakthrough pain).   0  . QUEtiapine (SEROQUEL) 100 MG tablet Take 100-200 mg by mouth at bedtime.     . ranitidine (ZANTAC) 300 MG tablet Take 300 mg by mouth at bedtime.  12  . acyclovir ointment (ZOVIRAX) 5 % Apply topically 3  (three) times daily. As needed. (Patient not taking: Reported on 10/25/2017) 30 g 1  . albuterol (PROVENTIL HFA;VENTOLIN HFA) 108 (90 Base) MCG/ACT inhaler Inhale 2 puffs into the lungs every 6 (six) hours as needed for wheezing or shortness of breath.    . calcium citrate-vitamin D (CALCIUM + D) 315-200 MG-UNIT per tablet Take 1 tablet by mouth daily. For bone health    . cyclobenzaprine (FLEXERIL) 10 MG tablet Take 10 mg by mouth 3 (three) times daily as needed for muscle spasms (scheduled every night at bedtime).    Marland Kitchen denosumab (PROLIA) 60 MG/ML SOLN injection Inject 60 mg into the skin every 6 (six) months. Administer in upper arm, thigh, or abdomen  NEXT IS DUE 03-2017 (Patient taking differently: Inject 60 mg into the skin every 6 (six) months. Administer in upper arm, thigh, or abdomen) 1 Syringe 0  . Multiple Vitamin (MULTIVITAMIN) tablet Take 1 tablet by mouth daily. For low vitamin (Patient not taking: Reported on 10/25/2017)    . Multiple Vitamins-Minerals (ADULT GUMMY) CHEW Chew 1 tablet by mouth daily.    . naproxen sodium (ALEVE) 220 MG tablet Take 220-440 mg by mouth 2 (two) times daily as needed (for pain).    . ondansetron (ZOFRAN) 8 MG tablet Take 8 mg by mouth every 8 (eight) hours as needed for nausea or vomiting (Migraine Head Ache symptoms).    . rizatriptan (MAXALT-MLT) 10 MG disintegrating tablet Take 10 mg by mouth as needed for migraine.   5  . valACYclovir (VALTREX) 500 MG tablet Take one tablet twice daily 3-5 days then daily as needed. 90 tablet 1    Musculoskeletal: Strength & Muscle Tone: within normal limits Gait & Station: normal Patient leans: N/A  Psychiatric Specialty Exam: Physical Exam  Nursing note and vitals reviewed. Constitutional: She appears well-developed and well-nourished.  HENT:  Head: Normocephalic.  Neck: Normal range of motion.  Respiratory: Effort normal.  Musculoskeletal: Normal range of motion.  Neurological: She is alert.  Psychiatric:  Her behavior is normal. Her mood appears anxious. Her speech is tangential. Thought content is delusional. Cognition and memory are impaired. She expresses inappropriate judgment.    Review of Systems  Psychiatric/Behavioral: Positive for memory loss. The patient is nervous/anxious.   All other systems reviewed and are negative.   Blood pressure (!) 141/84, pulse 78, temperature 98.4 F (36.9 C), temperature source Oral, resp. rate 15, height '5\' 6"'  (1.676 m), weight 71.2 kg, SpO2 94 %.Body mass index is 25.34 kg/m.  General Appearance: Casual  Eye Contact:  Good  Speech:  Normal Rate  Volume:  Normal  Mood:  Anxious  Affect:  Appropriate  Thought Process:  Disorganized  Orientation:  Other:  Person  Thought Content:  Illogical and Tangential  Suicidal Thoughts:  No  Homicidal Thoughts:  No  Memory:  difficult to assess d/t cognitive impairment  Judgement:  Poor  Insight:  Lacking  Psychomotor Activity:  Normal  Concentration:  Concentration: Fair  Recall:  Poor  Fund of Knowledge:  difficult to assess  Language:  Good  Akathisia:  No  Handed:  Right  AIMS (if indicated):     Assets:  Desire for Improvement Social Support  ADL's:  Intact  Cognition:  Impaired,  Moderate  Sleep:        Treatment Plan Summary: Daily contact with patient to assess and evaluate symptoms and progress in treatment, Medication management and Plan bipolar affective disorder, mania, moderate:   Altered Mental Status:  -UA positive for UTI, Keflex started -Will continue to monitor for cognitive improvement  Major Depressive Disorder, Recurrent, with Psychotic features:  - Will continue bupropion XL 366m q day, Prozac 425mdaily, and Quetiapine 10082m hs  Bipolar Disorder -Oxcarbazepine 600m42mD  Disposition: Recommend psychiatric Inpatient admission when medically cleared.  [facility] to [facility] 02/02/2018 10:48 AM  Patient seen face-to-face for psychiatric  evaluation, chart reviewed and case discussed with the physician extender and developed treatment plan. Reviewed the information documented and agree with the treatment plan. MojeCorena Pilgrim

## 2018-02-02 NOTE — ED Notes (Addendum)
Deputy Eulas Post from [employer] phones to tell us they will not be able to transport to [facility] until tomorrow (02-03-2018). [facility] address is: [address]  [place], [place].

## 2018-02-02 NOTE — Progress Notes (Signed)
CSW completed a second affidavit and petition and first opinion for patient. Patient will need to have a completed IVC faxed to [facility] hospital prior to arrival. The facility is expecting the patient this evening and report has already been called prior to nursing shift change.   [name], [place] Social Worker [phone]

## 2018-02-02 NOTE — Progress Notes (Signed)
Patient has a bed offer at [facility] hospital.  Patient is under IVC and will require sheriff transportation.  Accepting provider: Dr.Belynda Vesser  [name], [place] Social Worker [phone]

## 2018-02-02 NOTE — Progress Notes (Signed)
02/02/18  1245 Called report to  [phone]. Report given to [place].

## 2018-02-02 NOTE — Progress Notes (Signed)
02/02/18 1240 Daughter Victoria Levine [phone]

## 2018-02-03 DIAGNOSIS — J45909 Unspecified asthma, uncomplicated: Secondary | ICD-10-CM | POA: Diagnosis not present

## 2018-02-03 DIAGNOSIS — E039 Hypothyroidism, unspecified: Secondary | ICD-10-CM | POA: Diagnosis not present

## 2018-02-03 DIAGNOSIS — R4182 Altered mental status, unspecified: Secondary | ICD-10-CM | POA: Diagnosis not present

## 2018-02-03 DIAGNOSIS — F333 Major depressive disorder, recurrent, severe with psychotic symptoms: Secondary | ICD-10-CM | POA: Diagnosis not present

## 2018-02-03 DIAGNOSIS — F1514 Other stimulant abuse with stimulant-induced mood disorder: Secondary | ICD-10-CM | POA: Diagnosis not present

## 2018-02-03 DIAGNOSIS — Z046 Encounter for general psychiatric examination, requested by authority: Secondary | ICD-10-CM | POA: Diagnosis not present

## 2018-02-03 DIAGNOSIS — R51 Headache: Secondary | ICD-10-CM | POA: Diagnosis not present

## 2018-02-03 DIAGNOSIS — Z79899 Other long term (current) drug therapy: Secondary | ICD-10-CM | POA: Diagnosis not present

## 2018-02-03 NOTE — ED Notes (Signed)
Called report to [facility]. Victoria Levine

## 2018-02-03 NOTE — Progress Notes (Signed)
Number to call to give report to nurse at [facility] is [phone].

## 2018-02-03 NOTE — ED Notes (Signed)
Daughter left contact info  Taniaya Rudder  [phone].  Ex husband [phone].

## 2018-02-03 NOTE — ED Notes (Signed)
D/Ced to [facility] in [place], [place]

## 2018-02-04 DIAGNOSIS — R4182 Altered mental status, unspecified: Secondary | ICD-10-CM | POA: Diagnosis not present

## 2018-02-19 DIAGNOSIS — F4310 Post-traumatic stress disorder, unspecified: Secondary | ICD-10-CM | POA: Diagnosis not present

## 2018-02-19 DIAGNOSIS — F4481 Dissociative identity disorder: Secondary | ICD-10-CM | POA: Diagnosis not present

## 2018-02-19 DIAGNOSIS — F319 Bipolar disorder, unspecified: Secondary | ICD-10-CM | POA: Diagnosis not present

## 2018-02-25 DIAGNOSIS — Z23 Encounter for immunization: Secondary | ICD-10-CM | POA: Diagnosis not present

## 2018-02-25 DIAGNOSIS — F3131 Bipolar disorder, current episode depressed, mild: Secondary | ICD-10-CM | POA: Diagnosis not present

## 2018-02-25 DIAGNOSIS — G894 Chronic pain syndrome: Secondary | ICD-10-CM | POA: Diagnosis not present

## 2018-02-25 DIAGNOSIS — J45901 Unspecified asthma with (acute) exacerbation: Secondary | ICD-10-CM | POA: Diagnosis not present

## 2018-02-25 DIAGNOSIS — Z1211 Encounter for screening for malignant neoplasm of colon: Secondary | ICD-10-CM | POA: Diagnosis not present

## 2018-02-26 DIAGNOSIS — F4481 Dissociative identity disorder: Secondary | ICD-10-CM | POA: Diagnosis not present

## 2018-02-26 DIAGNOSIS — F4310 Post-traumatic stress disorder, unspecified: Secondary | ICD-10-CM | POA: Diagnosis not present

## 2018-02-26 DIAGNOSIS — F319 Bipolar disorder, unspecified: Secondary | ICD-10-CM | POA: Diagnosis not present

## 2018-02-27 DIAGNOSIS — F319 Bipolar disorder, unspecified: Secondary | ICD-10-CM | POA: Diagnosis not present

## 2018-02-27 DIAGNOSIS — F4481 Dissociative identity disorder: Secondary | ICD-10-CM | POA: Diagnosis not present

## 2018-02-27 DIAGNOSIS — F4310 Post-traumatic stress disorder, unspecified: Secondary | ICD-10-CM | POA: Diagnosis not present

## 2018-03-01 DIAGNOSIS — Z1211 Encounter for screening for malignant neoplasm of colon: Secondary | ICD-10-CM | POA: Diagnosis not present

## 2018-03-10 DIAGNOSIS — M545 Low back pain: Secondary | ICD-10-CM | POA: Diagnosis not present

## 2018-03-28 DIAGNOSIS — R0789 Other chest pain: Secondary | ICD-10-CM | POA: Diagnosis not present

## 2018-03-31 DIAGNOSIS — G894 Chronic pain syndrome: Secondary | ICD-10-CM | POA: Diagnosis not present

## 2018-03-31 DIAGNOSIS — M47816 Spondylosis without myelopathy or radiculopathy, lumbar region: Secondary | ICD-10-CM | POA: Diagnosis not present

## 2018-03-31 DIAGNOSIS — Z79899 Other long term (current) drug therapy: Secondary | ICD-10-CM | POA: Diagnosis not present

## 2018-03-31 DIAGNOSIS — E559 Vitamin D deficiency, unspecified: Secondary | ICD-10-CM | POA: Diagnosis not present

## 2018-03-31 DIAGNOSIS — M129 Arthropathy, unspecified: Secondary | ICD-10-CM | POA: Diagnosis not present

## 2018-04-02 DIAGNOSIS — F319 Bipolar disorder, unspecified: Secondary | ICD-10-CM | POA: Diagnosis not present

## 2018-04-02 DIAGNOSIS — F4481 Dissociative identity disorder: Secondary | ICD-10-CM | POA: Diagnosis not present

## 2018-04-02 DIAGNOSIS — F4310 Post-traumatic stress disorder, unspecified: Secondary | ICD-10-CM | POA: Diagnosis not present

## 2018-04-11 DIAGNOSIS — J209 Acute bronchitis, unspecified: Secondary | ICD-10-CM | POA: Diagnosis not present

## 2018-04-15 DIAGNOSIS — F1120 Opioid dependence, uncomplicated: Secondary | ICD-10-CM | POA: Diagnosis not present

## 2018-04-15 DIAGNOSIS — Z79899 Other long term (current) drug therapy: Secondary | ICD-10-CM | POA: Diagnosis not present

## 2018-04-15 DIAGNOSIS — M47816 Spondylosis without myelopathy or radiculopathy, lumbar region: Secondary | ICD-10-CM | POA: Diagnosis not present

## 2018-04-15 DIAGNOSIS — G894 Chronic pain syndrome: Secondary | ICD-10-CM | POA: Diagnosis not present

## 2018-04-25 ENCOUNTER — Other Ambulatory Visit: Payer: Self-pay | Admitting: Family Medicine

## 2018-04-25 ENCOUNTER — Ambulatory Visit
Admission: RE | Admit: 2018-04-25 | Discharge: 2018-04-25 | Disposition: A | Discharge status: Home / Self Care | Payer: Medicare HMO | Source: Ambulatory Visit | Attending: Family Medicine | Admitting: Family Medicine

## 2018-04-25 DIAGNOSIS — R079 Chest pain, unspecified: Secondary | ICD-10-CM

## 2018-04-25 DIAGNOSIS — S2231XA Fracture of one rib, right side, initial encounter for closed fracture: Secondary | ICD-10-CM | POA: Diagnosis not present

## 2018-05-14 DIAGNOSIS — Z1159 Encounter for screening for other viral diseases: Secondary | ICD-10-CM | POA: Diagnosis not present

## 2018-05-14 DIAGNOSIS — E7800 Pure hypercholesterolemia, unspecified: Secondary | ICD-10-CM | POA: Diagnosis not present

## 2018-05-14 DIAGNOSIS — E039 Hypothyroidism, unspecified: Secondary | ICD-10-CM | POA: Diagnosis not present

## 2018-05-14 DIAGNOSIS — Z23 Encounter for immunization: Secondary | ICD-10-CM | POA: Diagnosis not present

## 2018-05-14 DIAGNOSIS — Z0000 Encounter for general adult medical examination without abnormal findings: Secondary | ICD-10-CM | POA: Diagnosis not present

## 2018-05-14 DIAGNOSIS — M47816 Spondylosis without myelopathy or radiculopathy, lumbar region: Secondary | ICD-10-CM | POA: Diagnosis not present

## 2018-05-14 DIAGNOSIS — J4540 Moderate persistent asthma, uncomplicated: Secondary | ICD-10-CM | POA: Diagnosis not present

## 2018-05-14 DIAGNOSIS — E559 Vitamin D deficiency, unspecified: Secondary | ICD-10-CM | POA: Diagnosis not present

## 2018-05-14 DIAGNOSIS — S2239XA Fracture of one rib, unspecified side, initial encounter for closed fracture: Secondary | ICD-10-CM | POA: Diagnosis not present

## 2018-05-14 DIAGNOSIS — E611 Iron deficiency: Secondary | ICD-10-CM | POA: Diagnosis not present

## 2018-05-14 DIAGNOSIS — Z79899 Other long term (current) drug therapy: Secondary | ICD-10-CM | POA: Diagnosis not present

## 2018-05-14 DIAGNOSIS — G894 Chronic pain syndrome: Secondary | ICD-10-CM | POA: Diagnosis not present

## 2018-05-28 DIAGNOSIS — D485 Neoplasm of uncertain behavior of skin: Secondary | ICD-10-CM | POA: Diagnosis not present

## 2018-05-28 DIAGNOSIS — L821 Other seborrheic keratosis: Secondary | ICD-10-CM | POA: Diagnosis not present

## 2018-05-28 DIAGNOSIS — I781 Nevus, non-neoplastic: Secondary | ICD-10-CM | POA: Diagnosis not present

## 2018-05-28 DIAGNOSIS — L814 Other melanin hyperpigmentation: Secondary | ICD-10-CM | POA: Diagnosis not present

## 2018-05-30 DIAGNOSIS — L720 Epidermal cyst: Secondary | ICD-10-CM | POA: Diagnosis not present

## 2018-05-30 DIAGNOSIS — D485 Neoplasm of uncertain behavior of skin: Secondary | ICD-10-CM | POA: Diagnosis not present

## 2018-06-02 DIAGNOSIS — J209 Acute bronchitis, unspecified: Secondary | ICD-10-CM | POA: Diagnosis not present

## 2018-06-04 DIAGNOSIS — C4491 Basal cell carcinoma of skin, unspecified: Secondary | ICD-10-CM | POA: Diagnosis not present

## 2018-06-12 DIAGNOSIS — M47816 Spondylosis without myelopathy or radiculopathy, lumbar region: Secondary | ICD-10-CM | POA: Diagnosis not present

## 2018-06-12 DIAGNOSIS — F1120 Opioid dependence, uncomplicated: Secondary | ICD-10-CM | POA: Diagnosis not present

## 2018-06-12 DIAGNOSIS — G894 Chronic pain syndrome: Secondary | ICD-10-CM | POA: Diagnosis not present

## 2018-06-12 DIAGNOSIS — Z79899 Other long term (current) drug therapy: Secondary | ICD-10-CM | POA: Diagnosis not present

## 2018-07-02 DIAGNOSIS — F319 Bipolar disorder, unspecified: Secondary | ICD-10-CM | POA: Diagnosis not present

## 2018-07-02 DIAGNOSIS — F4310 Post-traumatic stress disorder, unspecified: Secondary | ICD-10-CM | POA: Diagnosis not present

## 2018-07-02 DIAGNOSIS — F4481 Dissociative identity disorder: Secondary | ICD-10-CM | POA: Diagnosis not present

## 2018-07-14 DIAGNOSIS — G894 Chronic pain syndrome: Secondary | ICD-10-CM | POA: Diagnosis not present

## 2018-07-14 DIAGNOSIS — M47816 Spondylosis without myelopathy or radiculopathy, lumbar region: Secondary | ICD-10-CM | POA: Diagnosis not present

## 2018-07-14 DIAGNOSIS — Z79899 Other long term (current) drug therapy: Secondary | ICD-10-CM | POA: Diagnosis not present

## 2018-07-23 DIAGNOSIS — L720 Epidermal cyst: Secondary | ICD-10-CM | POA: Diagnosis not present

## 2018-07-23 DIAGNOSIS — D0462 Carcinoma in situ of skin of left upper limb, including shoulder: Secondary | ICD-10-CM | POA: Diagnosis not present

## 2018-07-23 DIAGNOSIS — C44519 Basal cell carcinoma of skin of other part of trunk: Secondary | ICD-10-CM | POA: Diagnosis not present

## 2018-08-12 DIAGNOSIS — Z79899 Other long term (current) drug therapy: Secondary | ICD-10-CM | POA: Diagnosis not present

## 2018-08-12 DIAGNOSIS — M47816 Spondylosis without myelopathy or radiculopathy, lumbar region: Secondary | ICD-10-CM | POA: Diagnosis not present

## 2018-08-12 DIAGNOSIS — G894 Chronic pain syndrome: Secondary | ICD-10-CM | POA: Diagnosis not present

## 2018-08-13 DIAGNOSIS — C44519 Basal cell carcinoma of skin of other part of trunk: Secondary | ICD-10-CM | POA: Diagnosis not present

## 2018-08-13 DIAGNOSIS — D0462 Carcinoma in situ of skin of left upper limb, including shoulder: Secondary | ICD-10-CM | POA: Diagnosis not present

## 2018-08-19 DIAGNOSIS — J45901 Unspecified asthma with (acute) exacerbation: Secondary | ICD-10-CM | POA: Diagnosis not present

## 2018-09-11 DIAGNOSIS — Z79899 Other long term (current) drug therapy: Secondary | ICD-10-CM | POA: Diagnosis not present

## 2018-09-11 DIAGNOSIS — M47816 Spondylosis without myelopathy or radiculopathy, lumbar region: Secondary | ICD-10-CM | POA: Diagnosis not present

## 2018-09-11 DIAGNOSIS — Y92009 Unspecified place in unspecified non-institutional (private) residence as the place of occurrence of the external cause: Secondary | ICD-10-CM | POA: Diagnosis not present

## 2018-09-11 DIAGNOSIS — Z1159 Encounter for screening for other viral diseases: Secondary | ICD-10-CM | POA: Diagnosis not present

## 2018-09-11 DIAGNOSIS — W19XXXA Unspecified fall, initial encounter: Secondary | ICD-10-CM | POA: Diagnosis not present

## 2018-09-11 DIAGNOSIS — G894 Chronic pain syndrome: Secondary | ICD-10-CM | POA: Diagnosis not present

## 2018-09-24 DIAGNOSIS — Z85828 Personal history of other malignant neoplasm of skin: Secondary | ICD-10-CM | POA: Diagnosis not present

## 2018-09-24 DIAGNOSIS — L821 Other seborrheic keratosis: Secondary | ICD-10-CM | POA: Diagnosis not present

## 2018-09-24 DIAGNOSIS — D0462 Carcinoma in situ of skin of left upper limb, including shoulder: Secondary | ICD-10-CM | POA: Diagnosis not present

## 2018-09-30 DIAGNOSIS — F4310 Post-traumatic stress disorder, unspecified: Secondary | ICD-10-CM | POA: Diagnosis not present

## 2018-09-30 DIAGNOSIS — F4481 Dissociative identity disorder: Secondary | ICD-10-CM | POA: Diagnosis not present

## 2018-09-30 DIAGNOSIS — F319 Bipolar disorder, unspecified: Secondary | ICD-10-CM | POA: Diagnosis not present

## 2018-10-09 DIAGNOSIS — M47816 Spondylosis without myelopathy or radiculopathy, lumbar region: Secondary | ICD-10-CM | POA: Diagnosis not present

## 2018-10-09 DIAGNOSIS — G894 Chronic pain syndrome: Secondary | ICD-10-CM | POA: Diagnosis not present

## 2018-10-09 DIAGNOSIS — Z760 Encounter for issue of repeat prescription: Secondary | ICD-10-CM | POA: Diagnosis not present

## 2018-10-09 DIAGNOSIS — Z79899 Other long term (current) drug therapy: Secondary | ICD-10-CM | POA: Diagnosis not present

## 2018-11-10 DIAGNOSIS — M47816 Spondylosis without myelopathy or radiculopathy, lumbar region: Secondary | ICD-10-CM | POA: Diagnosis not present

## 2018-11-10 DIAGNOSIS — Z79899 Other long term (current) drug therapy: Secondary | ICD-10-CM | POA: Diagnosis not present

## 2018-11-18 ENCOUNTER — Other Ambulatory Visit: Payer: Self-pay

## 2018-11-19 ENCOUNTER — Ambulatory Visit (INDEPENDENT_AMBULATORY_CARE_PROVIDER_SITE_OTHER): Payer: Medicare HMO | Admitting: Women's Health

## 2018-11-19 ENCOUNTER — Encounter: Payer: Self-pay | Admitting: Women's Health

## 2018-11-19 VITALS — BP 124/78 | Ht 64.0 in | Wt 160.0 lb

## 2018-11-19 DIAGNOSIS — Z9289 Personal history of other medical treatment: Secondary | ICD-10-CM | POA: Diagnosis not present

## 2018-11-19 DIAGNOSIS — Z01419 Encounter for gynecological examination (general) (routine) without abnormal findings: Secondary | ICD-10-CM | POA: Diagnosis not present

## 2018-11-19 DIAGNOSIS — E2839 Other primary ovarian failure: Secondary | ICD-10-CM

## 2018-11-19 NOTE — Progress Notes (Signed)
Victoria Levine 03-24-1950 [id number]    History:    Presents for breast and pelvic exam with no GYN complaints.  Continues to struggle with chronic back pain , pain manages medication.  2018 had a traumatic fall off a ladder, pain management manages medications.  Primary care manages hypothyroidism, hypercholesteremia, bipolar disease.  Normal Pap and mammogram history.  2017 T score -1.9-on Prolia for several labs prior had problems insurance coverage so stopped.  Tolerated well.  History of alcohol abuse , sober greater than 20 years.  Declined colonoscopy.  Vaccines current.  Not sexually active in years.  Basal skin cancer-dermatologist manages  Past medical history, past surgical history, family history and social history were all reviewed and documented in the EPIC chart.  Daughter lives in [place] has 3 children.  ROS:  A ROS was performed and pertinent positives and negatives are included.  Exam:  Vitals:   11/19/18 1422  BP: 124/78  Weight: 160 lb (72.6 kg)  Height: 5\' 4"  (1.626 m)   Body mass index is 27.46 kg/m.   General appearance:  Normal Thyroid:  Symmetrical, normal in size, without palpable masses or nodularity. Respiratory  Auscultation:  Clear without wheezing or rhonchi Cardiovascular  Auscultation:  Regular rate, without rubs, murmurs or gallops  Edema/varicosities:  Not grossly evident Abdominal  Soft,nontender, without masses, guarding or rebound.  Liver/spleen:  No organomegaly noted  Hernia:  None appreciated  Skin  Inspection:  Grossly normal   Breasts: Examined lying and sitting.     Right: Without masses, retractions, discharge or axillary adenopathy.     Left: Without masses, retractions, discharge or axillary adenopathy. Gentitourinary   Inguinal/mons:  Normal without inguinal adenopathy  External genitalia:  Normal  BUS/Urethra/Skene's glands:  Normal  Vagina: Atrophic   Cervix:  Normal  Uterus:   normal in size, shape and contour.   Midline and mobile  Adnexa/parametria:     Rt: Without masses or tenderness.   Lt: Without masses or tenderness.  Anus and perineum: Normal  Digital rectal exam: Normal sphincter tone without palpated masses or tenderness  Assessment/Plan:  68 y.o. D WF G3, P1 for breast and pelvic exam with no GYN complaints.  Postmenopausal/no HRT/no bleeding Basal skin cancer-dermatologist manages Osteopenia Prolia in the past Chronic back pain-pain management Hypothyroidism, hypercholesteremia, bipolar, migraines- primary care manages labs and meds  Plan: SBEs, annual screening mammogram has had at [facility] but overdue instructed to schedule.  Repeat DEXA, will schedule.  Reviewed importance of weightbearing and balance type exercise, has limited mobility due to chronic back pain, states will walk as best she can.  Vitamin D 2000 daily encouraged.  Home safety, fall prevention discussed.    [name] [facility], 2:45 PM 11/19/2018

## 2018-11-19 NOTE — Patient Instructions (Signed)
Victoria Levine  [phone]  Call for mammogram Health Maintenance After Age 70 After age 33, you are at a higher risk for certain long-term diseases and infections as well as injuries from falls. Falls are a major cause of broken bones and head injuries in people who are older than age 79. Getting regular preventive care can help to keep you healthy and well. Preventive care includes getting regular testing and making lifestyle changes as recommended by your health care provider. Talk with your health care provider about:  Which screenings and tests you should have. A screening is a test that checks for a disease when you have no symptoms.  A diet and exercise plan that is right for you. What should I know about screenings and tests to prevent falls? Screening and testing are the best ways to find a health problem early. Early diagnosis and treatment give you the best chance of managing medical conditions that are common after age 55. Certain conditions and lifestyle choices may make you more likely to have a fall. Your health care provider may recommend:  Regular vision checks. Poor vision and conditions such as cataracts can make you more likely to have a fall. If you wear glasses, make sure to get your prescription updated if your vision changes.  Medicine review. Work with your health care provider to regularly review all of the medicines you are taking, including over-the-counter medicines. Ask your health care provider about any side effects that may make you more likely to have a fall. Tell your health care provider if any medicines that you take make you feel dizzy or sleepy.  Osteoporosis screening. Osteoporosis is a condition that causes the bones to get weaker. This can make the bones weak and cause them to break more easily.  Blood pressure screening. Blood pressure changes and medicines to control blood pressure can make you feel dizzy.  Strength and balance checks. Your health care provider  may recommend certain tests to check your strength and balance while standing, walking, or changing positions.  Foot health exam. Foot pain and numbness, as well as not wearing proper footwear, can make you more likely to have a fall.  Depression screening. You may be more likely to have a fall if you have a fear of falling, feel emotionally low, or feel unable to do activities that you used to do.  Alcohol use screening. Using too much alcohol can affect your balance and may make you more likely to have a fall. What actions can I take to lower my risk of falls? General instructions  Talk with your health care provider about your risks for falling. Tell your health care provider if: ? You fall. Be sure to tell your health care provider about all falls, even ones that seem minor. ? You feel dizzy, sleepy, or off-balance.  Take over-the-counter and prescription medicines only as told by your health care provider. These include any supplements.  Eat a healthy diet and maintain a healthy weight. A healthy diet includes low-fat dairy products, low-fat (lean) meats, and fiber from whole grains, beans, and lots of fruits and vegetables. Home safety  Remove any tripping hazards, such as rugs, cords, and clutter.  Install safety equipment such as grab bars in bathrooms and safety rails on stairs.  Keep rooms and walkways well-lit. Activity   Follow a regular exercise program to stay fit. This will help you maintain your balance. Ask your health care provider what types of exercise are appropriate for you.  If you need a cane or walker, use it as recommended by your health care provider.  Wear supportive shoes that have nonskid soles. Lifestyle  Do not drink alcohol if your health care provider tells you not to drink.  If you drink alcohol, limit how much you have: ? 0-1 drink a day for women. ? 0-2 drinks a day for men.  Be aware of how much alcohol is in your drink. In the [place] one  drink equals one typical bottle of beer (12 oz), one-half glass of wine (5 oz), or one shot of hard liquor (1 oz).  Do not use any products that contain nicotine or tobacco, such as cigarettes and e-cigarettes. If you need help quitting, ask your health care provider. Summary  Having a healthy lifestyle and getting preventive care can help to protect your health and wellness after age 108.  Screening and testing are the best way to find a health problem early and help you avoid having a fall. Early diagnosis and treatment give you the best chance for managing medical conditions that are more common for people who are older than age 70.  Falls are a major cause of broken bones and head injuries in people who are older than age 42. Take precautions to prevent a fall at home.  Work with your health care provider to learn what changes you can make to improve your health and wellness and to prevent falls. This information is not intended to replace advice given to you by your health care provider. Make sure you discuss any questions you have with your health care provider. Document Released: 01/16/2017 Document Revised: 06/26/2018 Document Reviewed: 01/16/2017 Elsevier Patient Education  2020 [employer].

## 2018-12-03 ENCOUNTER — Encounter: Payer: Self-pay | Admitting: Women's Health

## 2018-12-03 DIAGNOSIS — Z1231 Encounter for screening mammogram for malignant neoplasm of breast: Secondary | ICD-10-CM | POA: Diagnosis not present

## 2018-12-11 ENCOUNTER — Ambulatory Visit (INDEPENDENT_AMBULATORY_CARE_PROVIDER_SITE_OTHER): Payer: Medicare HMO

## 2018-12-11 ENCOUNTER — Other Ambulatory Visit: Payer: Self-pay

## 2018-12-11 DIAGNOSIS — M810 Age-related osteoporosis without current pathological fracture: Secondary | ICD-10-CM

## 2018-12-11 DIAGNOSIS — Z780 Asymptomatic menopausal state: Secondary | ICD-10-CM | POA: Diagnosis not present

## 2018-12-11 DIAGNOSIS — G894 Chronic pain syndrome: Secondary | ICD-10-CM | POA: Diagnosis not present

## 2018-12-11 DIAGNOSIS — E2839 Other primary ovarian failure: Secondary | ICD-10-CM

## 2018-12-11 DIAGNOSIS — M47816 Spondylosis without myelopathy or radiculopathy, lumbar region: Secondary | ICD-10-CM | POA: Diagnosis not present

## 2018-12-11 DIAGNOSIS — Z79899 Other long term (current) drug therapy: Secondary | ICD-10-CM | POA: Diagnosis not present

## 2018-12-12 ENCOUNTER — Telehealth: Payer: Self-pay | Admitting: Gynecology

## 2018-12-12 ENCOUNTER — Other Ambulatory Visit: Payer: Self-pay | Admitting: Gynecology

## 2018-12-12 ENCOUNTER — Encounter: Payer: Self-pay | Admitting: Gynecology

## 2018-12-12 DIAGNOSIS — M810 Age-related osteoporosis without current pathological fracture: Secondary | ICD-10-CM

## 2018-12-12 DIAGNOSIS — Z780 Asymptomatic menopausal state: Secondary | ICD-10-CM

## 2018-12-12 NOTE — Telephone Encounter (Signed)
Left message for pt to call.

## 2018-12-12 NOTE — Telephone Encounter (Signed)
The patient's most recent bone density shows osteoporosis.  She shows a 10% loss in both hips from her prior study.  My understanding is that she is no longer on any medication for osteoporosis.  My recommendation would be office visit to discuss treatment options.

## 2018-12-17 NOTE — Telephone Encounter (Signed)
Patient informed with below note. 

## 2018-12-23 ENCOUNTER — Other Ambulatory Visit: Payer: Self-pay

## 2018-12-24 ENCOUNTER — Encounter: Payer: Self-pay | Admitting: Gynecology

## 2018-12-24 ENCOUNTER — Ambulatory Visit (INDEPENDENT_AMBULATORY_CARE_PROVIDER_SITE_OTHER): Payer: Medicare HMO | Admitting: Gynecology

## 2018-12-24 VITALS — BP 118/78

## 2018-12-24 DIAGNOSIS — M810 Age-related osteoporosis without current pathological fracture: Secondary | ICD-10-CM

## 2018-12-24 NOTE — Progress Notes (Signed)
    Victoria Levine 05/22/1950 [id number]        67 y.o.  G3P1021 presents to discuss her most recent bone density showing T score -2.6 with a 10 to 11% loss at both the right and left total hips compared to prior study 2017.  She had been on Prolia from 2013 through 2018 but discontinued on her own.  Had a trial of Reclast once preceding this with significant side effects to include a temperature of 104.  Past medical history,surgical history, problem list, medications, allergies, family history and social history were all reviewed and documented in the EPIC chart.  Directed ROS with pertinent positives and negatives documented in the history of present illness/assessment and plan.  Exam: Vitals:   12/24/18 1427  BP: 118/78   General appearance:  Normal   Assessment/Plan:  68 y.o. [id number] with osteoporosis.  We reviewed her DEXA study in detail and I provided a copy for her to have.  History of spinal fractures with fusion.  10 to 11% loss at right and left hip since last's DEXA 2017.  Had used Prolia in the past without side effects.  We discussed treatment options to include bisphosphate's, Prolia, Evista, Evinity, Forteo.  The risks and side effects of each choice discussed.  We discussed osteonecrosis of the jaw, atypical fractures, rashes, fevers, thrombosis, cardiovascular risks and the Forteo risks.  She did well on Prolia previously my recommendation would be to reinitiate Prolia now and to continue this.  Need to continue from now on due to rapid bone loss with discontinuation reviewed which may account for her 10 to 11% loss from her prior study having discontinued the Prolia in the interim.  All this was reviewed with the patient and she wants to proceed with Prolia.  We will make arrangements and start her therapy.  25 minutes face-to-face to include records review, review of her current DEXA study, review of medication options and ultimate decision for Prolia.   Anastasio Auerbach MD, 2:51 PM 12/24/2018

## 2018-12-24 NOTE — Patient Instructions (Signed)
The office will call you to initiate the Prolia injections

## 2018-12-25 ENCOUNTER — Telehealth: Payer: Self-pay | Admitting: *Deleted

## 2018-12-25 DIAGNOSIS — L910 Hypertrophic scar: Secondary | ICD-10-CM | POA: Diagnosis not present

## 2018-12-25 DIAGNOSIS — M810 Age-related osteoporosis without current pathological fracture: Secondary | ICD-10-CM

## 2018-12-25 DIAGNOSIS — L821 Other seborrheic keratosis: Secondary | ICD-10-CM | POA: Diagnosis not present

## 2018-12-25 DIAGNOSIS — Z86007 Personal history of in-situ neoplasm of skin: Secondary | ICD-10-CM | POA: Diagnosis not present

## 2018-12-25 DIAGNOSIS — Z7189 Other specified counseling: Secondary | ICD-10-CM | POA: Diagnosis not present

## 2018-12-25 DIAGNOSIS — Z85828 Personal history of other malignant neoplasm of skin: Secondary | ICD-10-CM | POA: Diagnosis not present

## 2018-12-25 NOTE — Telephone Encounter (Signed)
-----   Message from Anastasio Auerbach, MD sent at 12/24/2018  2:50 PM EDT ----- Initiate Prolia.  Had used previously but discontinued.  Most recent DEXA T score -2.6 with 10% loss at both hips compared to prior study 2017

## 2018-12-30 DIAGNOSIS — F4481 Dissociative identity disorder: Secondary | ICD-10-CM | POA: Diagnosis not present

## 2018-12-30 DIAGNOSIS — F4310 Post-traumatic stress disorder, unspecified: Secondary | ICD-10-CM | POA: Diagnosis not present

## 2018-12-30 DIAGNOSIS — F319 Bipolar disorder, unspecified: Secondary | ICD-10-CM | POA: Diagnosis not present

## 2019-01-01 NOTE — Telephone Encounter (Signed)
Prolia insurance verification has been sent awaiting Summary of benefits  

## 2019-01-05 DIAGNOSIS — M47816 Spondylosis without myelopathy or radiculopathy, lumbar region: Secondary | ICD-10-CM | POA: Diagnosis not present

## 2019-01-05 DIAGNOSIS — Z79899 Other long term (current) drug therapy: Secondary | ICD-10-CM | POA: Diagnosis not present

## 2019-01-05 DIAGNOSIS — G894 Chronic pain syndrome: Secondary | ICD-10-CM | POA: Diagnosis not present

## 2019-01-05 DIAGNOSIS — Z7189 Other specified counseling: Secondary | ICD-10-CM | POA: Diagnosis not present

## 2019-01-07 ENCOUNTER — Telehealth: Payer: Self-pay | Admitting: *Deleted

## 2019-01-07 NOTE — Telephone Encounter (Addendum)
Deductible n/a  OOP MAX $3400 ($261.54)  Annual exam 11/19/2018 TF  Calcium  9.2          Date  01/14/2019  Upcoming dental procedures NO  Prior Authorization needed YES (approved)  Pt estimated Cost $222    appt 01/27/2019 @2 :20   Coverage Details: 20% one dose,20% admin fee

## 2019-01-07 NOTE — Telephone Encounter (Signed)
Patient called stating acyclovir ointment 5% needed Prior authorization, this was done via cover my meds, medication approved by [facility] until 03/18/2020. [facility] informed approval.

## 2019-01-14 ENCOUNTER — Other Ambulatory Visit: Payer: Self-pay

## 2019-01-14 ENCOUNTER — Other Ambulatory Visit: Payer: Medicare HMO

## 2019-01-14 DIAGNOSIS — M810 Age-related osteoporosis without current pathological fracture: Secondary | ICD-10-CM | POA: Diagnosis not present

## 2019-01-15 LAB — CALCIUM: Calcium: 9.2 mg/dL (ref 8.6–10.4)

## 2019-01-27 ENCOUNTER — Ambulatory Visit (INDEPENDENT_AMBULATORY_CARE_PROVIDER_SITE_OTHER): Payer: Medicare HMO | Admitting: Gynecology

## 2019-01-27 ENCOUNTER — Other Ambulatory Visit: Payer: Self-pay

## 2019-01-27 DIAGNOSIS — M810 Age-related osteoporosis without current pathological fracture: Secondary | ICD-10-CM | POA: Diagnosis not present

## 2019-01-27 MED ORDER — DENOSUMAB 60 MG/ML ~~LOC~~ SOSY
60.0000 mg | PREFILLED_SYRINGE | Freq: Once | SUBCUTANEOUS | Status: AC
  Administered 2019-01-27: 16:00:00 60 mg via SUBCUTANEOUS

## 2019-02-10 DIAGNOSIS — J209 Acute bronchitis, unspecified: Secondary | ICD-10-CM | POA: Diagnosis not present

## 2019-02-10 DIAGNOSIS — G894 Chronic pain syndrome: Secondary | ICD-10-CM | POA: Diagnosis not present

## 2019-02-10 DIAGNOSIS — R062 Wheezing: Secondary | ICD-10-CM | POA: Diagnosis not present

## 2019-02-10 DIAGNOSIS — Z79899 Other long term (current) drug therapy: Secondary | ICD-10-CM | POA: Diagnosis not present

## 2019-02-10 DIAGNOSIS — R0602 Shortness of breath: Secondary | ICD-10-CM | POA: Diagnosis not present

## 2019-02-10 DIAGNOSIS — R05 Cough: Secondary | ICD-10-CM | POA: Diagnosis not present

## 2019-02-10 DIAGNOSIS — Z03818 Encounter for observation for suspected exposure to other biological agents ruled out: Secondary | ICD-10-CM | POA: Diagnosis not present

## 2019-02-10 DIAGNOSIS — M47816 Spondylosis without myelopathy or radiculopathy, lumbar region: Secondary | ICD-10-CM | POA: Diagnosis not present

## 2019-02-11 DIAGNOSIS — J209 Acute bronchitis, unspecified: Secondary | ICD-10-CM | POA: Diagnosis not present

## 2019-03-11 DIAGNOSIS — M47816 Spondylosis without myelopathy or radiculopathy, lumbar region: Secondary | ICD-10-CM | POA: Diagnosis not present

## 2019-03-11 DIAGNOSIS — G894 Chronic pain syndrome: Secondary | ICD-10-CM | POA: Diagnosis not present

## 2019-03-11 DIAGNOSIS — Z79899 Other long term (current) drug therapy: Secondary | ICD-10-CM | POA: Diagnosis not present

## 2019-04-02 DIAGNOSIS — F4481 Dissociative identity disorder: Secondary | ICD-10-CM | POA: Diagnosis not present

## 2019-04-02 DIAGNOSIS — F4310 Post-traumatic stress disorder, unspecified: Secondary | ICD-10-CM | POA: Diagnosis not present

## 2019-04-02 DIAGNOSIS — F319 Bipolar disorder, unspecified: Secondary | ICD-10-CM | POA: Diagnosis not present

## 2019-04-07 ENCOUNTER — Ambulatory Visit: Payer: Medicare HMO | Attending: Internal Medicine

## 2019-04-07 DIAGNOSIS — Z23 Encounter for immunization: Secondary | ICD-10-CM | POA: Insufficient documentation

## 2019-04-07 NOTE — Progress Notes (Signed)
   Covid-19 Vaccination Clinic  Name:  Victoria Levine    MRN: [id number] DOB: Jun 02, 1950  04/07/2019  Victoria Levine was observed post Covid-19 immunization for 15 minutes without incidence. She was provided with Vaccine Information Sheet and instruction to access the V-Safe system.   Victoria Levine was instructed to call 911 with any severe reactions post vaccine: Marland Kitchen Difficulty breathing  . Swelling of your face and throat  . A fast heartbeat  . A bad rash all over your body  . Dizziness and weakness    Immunizations Administered    Name Date Dose VIS Date Route   Pfizer COVID-19 Vaccine 04/07/2019  3:56 PM 0.3 mL 02/27/2019 Intramuscular   Manufacturer: [place]   Lot: [id number]   [place]: [id number]

## 2019-04-13 DIAGNOSIS — M47816 Spondylosis without myelopathy or radiculopathy, lumbar region: Secondary | ICD-10-CM | POA: Diagnosis not present

## 2019-04-13 DIAGNOSIS — G894 Chronic pain syndrome: Secondary | ICD-10-CM | POA: Diagnosis not present

## 2019-04-13 DIAGNOSIS — Z79899 Other long term (current) drug therapy: Secondary | ICD-10-CM | POA: Diagnosis not present

## 2019-04-27 ENCOUNTER — Ambulatory Visit: Payer: Medicare HMO | Attending: Internal Medicine

## 2019-04-27 DIAGNOSIS — Z23 Encounter for immunization: Secondary | ICD-10-CM | POA: Insufficient documentation

## 2019-04-27 NOTE — Progress Notes (Signed)
   Covid-19 Vaccination Clinic  Name:  LAKEN HAYES    MRN: [id number] DOB: [birth year]  04/27/2019  Ms. Spedale was observed post Covid-19 immunization for 60 minutes without incidence. She was provided with Vaccine Information Sheet and instruction to access the V-Safe system.   Ms. Farace was instructed to call 911 with any severe reactions post vaccine: Marland Kitchen Difficulty breathing  . Swelling of your face and throat  . A fast heartbeat  . A bad rash all over your body  . Dizziness and weakness    Immunizations Administered    Name Date Dose VIS Date Route   Pfizer COVID-19 Vaccine 04/27/2019  8:51 AM 0.3 mL 02/27/2019 Intramuscular   Manufacturer: [place]   Lot: [id number]   [place]: [id number]

## 2019-04-30 ENCOUNTER — Encounter: Payer: Self-pay | Admitting: Gynecology

## 2019-05-08 ENCOUNTER — Other Ambulatory Visit: Payer: Self-pay

## 2019-05-08 ENCOUNTER — Encounter (HOSPITAL_COMMUNITY): Payer: Self-pay | Admitting: Student

## 2019-05-08 ENCOUNTER — Emergency Department (HOSPITAL_COMMUNITY)
Admission: EM | Admit: 2019-05-08 | Discharge: 2019-05-08 | Disposition: A | Discharge status: Home / Self Care | Payer: Medicare HMO | Attending: Emergency Medicine | Admitting: Emergency Medicine

## 2019-05-08 ENCOUNTER — Emergency Department (HOSPITAL_COMMUNITY): Payer: Medicare HMO

## 2019-05-08 DIAGNOSIS — R457 State of emotional shock and stress, unspecified: Secondary | ICD-10-CM | POA: Diagnosis not present

## 2019-05-08 DIAGNOSIS — R1010 Upper abdominal pain, unspecified: Secondary | ICD-10-CM | POA: Diagnosis not present

## 2019-05-08 DIAGNOSIS — R110 Nausea: Secondary | ICD-10-CM | POA: Diagnosis not present

## 2019-05-08 DIAGNOSIS — E039 Hypothyroidism, unspecified: Secondary | ICD-10-CM | POA: Insufficient documentation

## 2019-05-08 DIAGNOSIS — R112 Nausea with vomiting, unspecified: Secondary | ICD-10-CM | POA: Diagnosis not present

## 2019-05-08 DIAGNOSIS — R1110 Vomiting, unspecified: Secondary | ICD-10-CM | POA: Diagnosis not present

## 2019-05-08 DIAGNOSIS — J45909 Unspecified asthma, uncomplicated: Secondary | ICD-10-CM | POA: Diagnosis not present

## 2019-05-08 DIAGNOSIS — R1084 Generalized abdominal pain: Secondary | ICD-10-CM | POA: Diagnosis not present

## 2019-05-08 DIAGNOSIS — Z79899 Other long term (current) drug therapy: Secondary | ICD-10-CM | POA: Diagnosis not present

## 2019-05-08 DIAGNOSIS — R0689 Other abnormalities of breathing: Secondary | ICD-10-CM | POA: Diagnosis not present

## 2019-05-08 DIAGNOSIS — R5381 Other malaise: Secondary | ICD-10-CM | POA: Diagnosis not present

## 2019-05-08 DIAGNOSIS — I10 Essential (primary) hypertension: Secondary | ICD-10-CM | POA: Diagnosis not present

## 2019-05-08 LAB — CBC WITH DIFFERENTIAL/PLATELET
Abs Immature Granulocytes: 0.05 10*3/uL (ref 0.00–0.07)
Basophils Absolute: 0 10*3/uL (ref 0.0–0.1)
Basophils Relative: 0 %
Eosinophils Absolute: 0.1 10*3/uL (ref 0.0–0.5)
Eosinophils Relative: 1 %
HCT: 35.1 % — ABNORMAL LOW (ref 36.0–46.0)
Hemoglobin: 11 g/dL — ABNORMAL LOW (ref 12.0–15.0)
Immature Granulocytes: 1 %
Lymphocytes Relative: 9 %
Lymphs Abs: 0.9 10*3/uL (ref 0.7–4.0)
MCH: 26.1 pg (ref 26.0–34.0)
MCHC: 31.3 g/dL (ref 30.0–36.0)
MCV: 83.2 fL (ref 80.0–100.0)
Monocytes Absolute: 0.7 10*3/uL (ref 0.1–1.0)
Monocytes Relative: 7 %
Neutro Abs: 8.3 10*3/uL — ABNORMAL HIGH (ref 1.7–7.7)
Neutrophils Relative %: 82 %
Platelets: 483 10*3/uL — ABNORMAL HIGH (ref 150–400)
RBC: 4.22 MIL/uL (ref 3.87–5.11)
RDW: 13.8 % (ref 11.5–15.5)
WBC: 10.1 10*3/uL (ref 4.0–10.5)
nRBC: 0 % (ref 0.0–0.2)

## 2019-05-08 LAB — URINALYSIS, ROUTINE W REFLEX MICROSCOPIC
Bacteria, UA: NONE SEEN
Bilirubin Urine: NEGATIVE
Glucose, UA: NEGATIVE mg/dL
Hgb urine dipstick: NEGATIVE
Ketones, ur: 5 mg/dL — AB
Leukocytes,Ua: NEGATIVE
Nitrite: NEGATIVE
Protein, ur: 30 mg/dL — AB
Specific Gravity, Urine: 1.018 (ref 1.005–1.030)
pH: 7 (ref 5.0–8.0)

## 2019-05-08 LAB — COMPREHENSIVE METABOLIC PANEL
ALT: 14 U/L (ref 0–44)
AST: 17 U/L (ref 15–41)
Albumin: 4 g/dL (ref 3.5–5.0)
Alkaline Phosphatase: 71 U/L (ref 38–126)
Anion gap: 10 (ref 5–15)
BUN: 10 mg/dL (ref 8–23)
CO2: 22 mmol/L (ref 22–32)
Calcium: 9.1 mg/dL (ref 8.9–10.3)
Chloride: 100 mmol/L (ref 98–111)
Creatinine, Ser: 0.62 mg/dL (ref 0.44–1.00)
GFR calc Af Amer: >60 mL/min (ref >60)
GFR calc non Af Amer: >60 mL/min (ref >60)
Glucose, Bld: 166 mg/dL — ABNORMAL HIGH (ref 70–99)
Potassium: 3.7 mmol/L (ref 3.5–5.1)
Sodium: 132 mmol/L — ABNORMAL LOW (ref 135–145)
Total Bilirubin: 0.7 mg/dL (ref 0.3–1.2)
Total Protein: 7.5 g/dL (ref 6.5–8.1)

## 2019-05-08 LAB — LIPASE, BLOOD: Lipase: 25 U/L (ref 11–51)

## 2019-05-08 LAB — POC OCCULT BLOOD, ED: Fecal Occult Bld: NEGATIVE

## 2019-05-08 MED ORDER — CHLORDIAZEPOXIDE HCL 25 MG PO CAPS: ORAL_CAPSULE | ORAL | 0 refills | Status: DC

## 2019-05-08 MED ORDER — OMEPRAZOLE 20 MG PO CPDR: 20.0000 mg | DELAYED_RELEASE_CAPSULE | Freq: Every day | ORAL | 0 refills | Status: DC

## 2019-05-08 MED ORDER — LORAZEPAM 2 MG/ML IJ SOLN
1.0000 mg | Freq: Once | INTRAMUSCULAR | Status: AC
  Administered 2019-05-08: 07:00:00 1 mg via INTRAVENOUS
  Filled 2019-05-08: qty 1

## 2019-05-08 MED ORDER — SODIUM CHLORIDE 0.9 % IV BOLUS
1000.0000 mL | Freq: Once | INTRAVENOUS | Status: AC
  Administered 2019-05-08: 1000 mL via INTRAVENOUS

## 2019-05-08 MED ORDER — ALUM & MAG HYDROXIDE-SIMETH 200-200-20 MG/5ML PO SUSP
30.0000 mL | Freq: Once | ORAL | Status: AC
  Administered 2019-05-08: 30 mL via ORAL
  Filled 2019-05-08: qty 30

## 2019-05-08 MED ORDER — ONDANSETRON 4 MG PO TBDP: 4.0000 mg | ORAL_TABLET | Freq: Three times a day (TID) | ORAL | 0 refills | Status: DC | PRN

## 2019-05-08 MED ORDER — SUCRALFATE 1 GM/10ML PO SUSP: 1.0000 g | Freq: Three times a day (TID) | ORAL | 0 refills | Status: DC

## 2019-05-08 MED ORDER — FAMOTIDINE IN NACL 20-0.9 MG/50ML-% IV SOLN
20.0000 mg | Freq: Once | INTRAVENOUS | Status: AC
  Administered 2019-05-08: 20 mg via INTRAVENOUS
  Filled 2019-05-08: qty 50

## 2019-05-08 MED ORDER — IOHEXOL 300 MG/ML  SOLN
100.0000 mL | Freq: Once | INTRAMUSCULAR | Status: AC
  Administered 2019-05-08: 100 mL via INTRAVENOUS

## 2019-05-08 MED ORDER — SODIUM CHLORIDE (PF) 0.9 % IJ SOLN
INTRAMUSCULAR | Status: AC
  Filled 2019-05-08: qty 50

## 2019-05-08 NOTE — Telephone Encounter (Signed)
PROLIA GIVEN 01/27/2019 NEXT INJECTION 07/28/2019

## 2019-05-08 NOTE — ED Triage Notes (Addendum)
PER EMS: Patient is coming from home with c/o nausea and vomiting for the past three days. Patient has not been able to keep down any medications for the past two weeks. Pt has been sober for 30 years and about two and a half weeks ago she started to drink again. No Hx of HTN.  EMS VITALS: BP 170/100  HR 80 RR 28 CBG 230 SPO2 99% RA  20G LAC  MEDS  4mg  zofran

## 2019-05-08 NOTE — ED Provider Notes (Signed)
Medical screening examination/treatment/procedure(s) were conducted as a shared visit with non-physician practitioner(s) and myself.  I personally evaluated the patient during the encounter.    69 year old female with history of alcohol abuse presents with nausea vomiting.  Patient likely alcoholic gastritis.  Work appears reassuring.  Patient states.  Drinking alcohol will give her Librium taper along with Zofran.   Lacretia Leigh, MD 05/08/19 [phone]

## 2019-05-08 NOTE — Discharge Instructions (Signed)
You were seen in the emergency department today for nausea and vomiting.  Your work-up was overall reassuring.  Your labs show that you are mildly anemic with a hemoglobin of 11.0 and your blood sugar was somewhat high at 166, please have these rechecked by your primary care provider.  Your CT scan showed diverticulosis without infection, this is outpouching of your colon that frequently happens as people age.  We suspect your symptoms are related to alcohol induced gastritis.  We are sending you home with the following medicines: -Librium: Please take this as prescribed, this is to help with alcohol withdrawal, do not drink alcohol with this medication.  This medication can make you sleepy, do not drive or operate heavy machinery when taking it. -Zofran: Please take this medication every 8 hours as needed for nausea and vomiting. -Carafate: Take prior to meals and prior to bedtime to help coat your stomach and help with acidity -Omeprazole: Please take once in the morning prior to meals to help with stomach acidity as well.  We have prescribed you new medication(s) today. Discuss the medications prescribed today with your pharmacist as they can have adverse effects and interactions with your other medicines including over the counter and prescribed medications. Seek medical evaluation if you start to experience new or abnormal symptoms after taking one of these medicines, seek care immediately if you start to experience difficulty breathing, feeling of your throat closing, facial swelling, or rash as these could be indications of a more serious allergic reaction  Follow attached diet guidelines.  Follow-up with your primary care provider within 3 days.  Return to the emergency department for new or worsening symptoms including but not limited to worsening pain, inability to keep fluids down, blood in vomit or stool, fever, or any other concerns.

## 2019-05-08 NOTE — ED Provider Notes (Signed)
[facility] DEPT Provider Note   CSN: [id number] Arrival date & time: 05/08/19  [id number]     History Chief Complaint  Patient presents with  . Nausea  . Emesis    Victoria Levine is a 69 y.o. female with a history of bipolar disorder, chronic back pain, migraines, & EtOH abuse who presents to the ED via EMS with complaints of N/V that began 4 days prior. Patient states that Tuesday evening (02/16) she developed nausea & vomiting shortly after dinner. States she ate a lean cuisine that seemed to be cooked appropriately. Since then she has had TNTC episodes of emesis, > 10 episodes per day, with associated burning upper abdominal pain, and daily loose BMs. No alleviating/aggravating factors, other than some mild temporary relief with zofran given by EMS, however subsequently vomited again during my assessment. Of note patient has hx of EtOH abuse, quit and has been sober for 28 years until 2.5 weeks prior when she started drinking again, reports 2-3 drinks per day, last drink was evening of onset of symptoms. She is having a hard time telling if this is similar to her prior withdrawal symptoms. She denies fever, chills, hematemesis, melena, hematochezia, dysuria, frequency, urgency, chest pain, seizures, tremors, or hallucinations. She did feel a bit short of breath en route, EMS states she was hyperventilating some, this is resolved. Denies recent travel, abx, or hospitalizations.   HPI     Past Medical History:  Diagnosis Date  . Anemia    low iron  . Anxiety   . Asthma   . Bipolar disorder ([place])   . Chronic back pain   . Compression fracture of L2 lumbar vertebra (HCC)   . DDD (degenerative disc disease)   . Depression   . Dyspnea   . GERD (gastroesophageal reflux disease)   . H/O alcohol abuse    28 years sober  . H/O suicide attempt   . Herpes   . History of hiatal hernia   . Hypothyroidism   . Migraines   . Osteoporosis 11/2018   T score of  -2.6  . Pneumonia   . S/P adenoidectomy   . S/P tonsillectomy     Patient Active Problem List   Diagnosis Date Noted  . Closed compression fracture of L2 vertebra ([place]) 12/11/2017  . Pain 07/26/2015  . Anxiety state 06/28/2015  . Benzodiazepine dependence ([place]) 10/13/2014  . Opioid abuse with opioid-induced mood disorder ([place]) 10/13/2014  . Bipolar 1 disorder, depressed ([place]) 10/12/2014  . Spondylolisthesis of lumbar region 07/28/2014  . Osteoporosis, unspecified 11/17/2013  . Migraines   . Herpes   . HYPOTHYROIDISM 08/13/2008  . HYPERCHOLESTEROLEMIA 08/13/2008  . BIPOLAR DISORDER UNSPECIFIED 08/13/2008  . CHRONIC OBSTRUCTIVE ASTHMA UNSPECIFIED 08/13/2008  . GASTROESOPHAGEAL REFLUX DISEASE 08/13/2008  . HIATAL HERNIA 08/13/2008  . ALLERGY 08/13/2008    Past Surgical History:  Procedure Laterality Date  . CESAREAN SECTION    . COLONOSCOPY     as a teenager  . Compression fracture spine    . DILATION AND CURETTAGE OF UTERUS    . EYE SURGERY Bilateral    CATARACTS REMOVED, NO LENS PLACED  . HYSTEROSCOPY    . KYPHOPLASTY N/A 12/19/2016   Procedure: THORACIC 12 KYPHOPLASTY; REQUEST 60 MINS AND FLIP ROOM;  Surgeon: Phylliss Bob, MD;  Location: [facility];  Service: Orthopedics;  Laterality: N/A;  THORACIC 12 KYPHOPLASTY; REQUEST 60 MINS AND FLIP ROOM  . KYPHOPLASTY N/A 12/11/2017   Procedure: LUMBAR 2 KYPHOPLASTY;  Surgeon: Phylliss Bob, MD;  Location: [facility];  Service: Orthopedics;  Laterality: N/A;  . LUMBAR FUSION    . TONSILLECTOMY       OB History    Gravida  3   Para  1   Term  1   Preterm      AB  2   Living  1     SAB      TAB      Ectopic      Multiple      Live Births              Family History  Adopted: Yes    Social History   Tobacco Use  . Smoking status: Never Smoker  . Smokeless tobacco: Never Used  Substance Use Topics  . Alcohol use: No    Alcohol/week: 0.0 standard drinks    Comment: hx of heavy alcohol use - 28 years  sober  . Drug use: Yes    Types: Benzodiazepines    Comment: hx of cocaine use in her 20's    Home Medications Prior to Admission medications   Medication Sig Start Date End Date Taking? Authorizing Provider  acyclovir ointment (ZOVIRAX) 5 % Apply topically 3 (three) times daily. As needed. Patient not taking: Reported on 12/24/2018 05/01/17   [name], NP  albuterol (PROVENTIL HFA;VENTOLIN HFA) 108 (90 Base) MCG/ACT inhaler Inhale 2 puffs into the lungs every 6 (six) hours as needed for wheezing or shortness of breath.    [provider]  buPROPion (WELLBUTRIN XL) 150 MG 24 hr tablet Take 300 mg by mouth daily.  10/01/16   [provider]  calcium citrate-vitamin D (CALCIUM + D) 315-200 MG-UNIT per tablet Take 1 tablet by mouth daily. For bone health 10/18/14   Lindell Spar I, NP  cyclobenzaprine (FLEXERIL) 10 MG tablet Take 10 mg by mouth 3 (three) times daily as needed for muscle spasms (scheduled every night at bedtime).    [provider]  denosumab (PROLIA) 60 MG/ML SOLN injection Inject 60 mg into the skin every 6 (six) months. Administer in upper arm, thigh, or abdomen  NEXT IS DUE 03-2017 Patient not taking: Reported on 11/19/2018 03/05/17   [name], NP  diazepam (VALIUM) 5 MG tablet Take 5 mg by mouth every 6 (six) hours as needed for muscle spasms.    [provider]  FLUoxetine (PROZAC) 20 MG capsule Take 20 mg by mouth 2 (two) times daily.     [provider]  gabapentin (NEURONTIN) 600 MG tablet Take 600 mg by mouth 3 (three) times daily.  11/05/16   [provider]  levothyroxine (SYNTHROID, LEVOTHROID) 112 MCG tablet Take 112 mcg by mouth daily before breakfast.  09/04/17   [provider]  Multiple Vitamin (MULTIVITAMIN) tablet Take 1 tablet by mouth daily. For low vitamin 10/18/14   Lindell Spar I, NP  Multiple Vitamins-Minerals (ADULT GUMMY) CHEW Chew 1 tablet by mouth daily.    [provider]    naproxen sodium (ALEVE) 220 MG tablet Take 220-440 mg by mouth 2 (two) times daily as needed (for pain).    [provider]  ondansetron (ZOFRAN) 8 MG tablet Take 8 mg by mouth every 8 (eight) hours as needed for nausea or vomiting (Migraine Head Ache symptoms).    [provider]  oxcarbazepine (TRILEPTAL) 600 MG tablet Take 600 mg by mouth 2 (two) times daily.     [provider]  OxyCODONE  ER (XTAMPZA ER) 13.5 MG C12A Take 13.5 mg by mouth 2 (two) times daily. 11/22/16   [provider]  Oxycodone HCl 10 MG TABS Take 10 mg by mouth daily as needed (for breakthrough pain).  09/16/17   [provider]  QUEtiapine (SEROQUEL) 100 MG tablet Take 100-200 mg by mouth at bedtime.  10/12/16   [provider]  rizatriptan (MAXALT-MLT) 10 MG disintegrating tablet Take 10 mg by mouth as needed for migraine.  11/08/14   [provider]  valACYclovir (VALTREX) 500 MG tablet Take one tablet twice daily 3-5 days then daily as needed. 05/01/17   [name], NP    Allergies    Prednisone, Lithium carbonate, and Sulfa antibiotics  Review of Systems   Review of Systems  Constitutional: Negative for chills and fever.  Respiratory: Positive for shortness of breath (resolved @ present). Negative for cough.   Cardiovascular: Negative for chest pain.  Gastrointestinal: Positive for abdominal pain, diarrhea, nausea and vomiting. Negative for anal bleeding, blood in stool and constipation.  Genitourinary: Negative for dysuria and hematuria.  Neurological: Negative for tremors, seizures and syncope.  Psychiatric/Behavioral: Negative for hallucinations.  All other systems reviewed and are negative.   Physical Exam Updated Vital Signs BP (!) 176/93 (BP Location: Right Arm)   Pulse 92   Temp 98.9 F (37.2 C) (Oral)   Resp 16   SpO2 97%   Physical Exam Vitals and nursing note reviewed.  Constitutional:      General: She is not in acute distress.     Appearance: She is well-developed. She is not toxic-appearing.  HENT:     Head: Normocephalic and atraumatic.     Mouth/Throat:     Mouth: Mucous membranes are dry.  Eyes:     General:        Right eye: No discharge.        Left eye: No discharge.     Conjunctiva/sclera: Conjunctivae normal.  Cardiovascular:     Rate and Rhythm: Normal rate and regular rhythm.  Pulmonary:     Effort: Pulmonary effort is normal. No respiratory distress.     Breath sounds: Normal breath sounds. No wheezing, rhonchi or rales.  Abdominal:     General: There is no distension.     Palpations: Abdomen is soft.     Tenderness: There is abdominal tenderness (generalized, most prominent in epigastrium). There is no right CVA tenderness, left CVA tenderness, guarding or rebound.  Musculoskeletal:     Cervical back: Neck supple.  Skin:    General: Skin is warm and dry.     Findings: No rash.  Neurological:     Mental Status: She is alert.     Motor: No tremor.     Comments: Clear speech.   Psychiatric:        Behavior: Behavior normal.     ED Results / Procedures / Treatments   Labs (all labs ordered are listed, but only abnormal results are displayed) Labs Reviewed  COMPREHENSIVE METABOLIC PANEL - Abnormal; Notable for the following components:      Result Value   Sodium 132 (*)    Glucose, Bld 166 (*)    All other components within normal limits  CBC WITH DIFFERENTIAL/PLATELET - Abnormal; Notable for the following components:   Hemoglobin 11.0 (*)    HCT 35.1 (*)    Platelets 483 (*)    Neutro Abs 8.3 (*)    All other components within normal limits  URINALYSIS, ROUTINE W REFLEX MICROSCOPIC - Abnormal; Notable for the following components:   Color, Urine STRAW (*)    Ketones, ur 5 (*)    Protein, ur 30 (*)    All other components within normal limits  LIPASE, BLOOD  POC OCCULT BLOOD, ED    EKG None  Radiology CT Abdomen Pelvis W Contrast  Result Date: 05/08/2019 CLINICAL DATA:   Patient is coming from home with c/o nausea and vomiting and abd pain for the past three days. Patient has not been able to keep down any medications for the past two weeks. EXAM: CT ABDOMEN AND PELVIS WITH CONTRAST TECHNIQUE: Multidetector CT imaging of the abdomen and pelvis was performed using the standard protocol following bolus administration of intravenous contrast. CONTRAST:  127mL OMNIPAQUE IOHEXOL 300 MG/ML  SOLN COMPARISON:  None. FINDINGS: Lower chest: Lung bases are clear. Hepatobiliary: No focal hepatic lesion. No biliary duct dilatation. Gallbladder is normal. Common bile duct is normal. Pancreas: Pancreas is normal. No ductal dilatation. No pancreatic inflammation. Spleen: Normal spleen Adrenals/urinary tract: Adrenal glands and kidneys are normal. The ureters and bladder normal. Stomach/Bowel: Moderate size hiatal hernia. No gastric outlet obstruction. Duodenum small-bowel normal. Appendix normal. Cecum normal. Multiple diverticula of the descending colon and sigmoid colon without acute inflammation. Vascular/Lymphatic: Abdominal aorta is normal caliber with atherosclerotic calcification. There is no retroperitoneal or periportal lymphadenopathy. No pelvic lymphadenopathy. Reproductive: Uterus and ovaries normal Other: No free fluid. Musculoskeletal: Posterior lumbar fusion. Compression fractures at T12 and L2 with kyphoplasty cement. No acute findings IMPRESSION: 1. No acute findings in the abdomen pelvis. 2. Moderate to large hiatal hernia. 3. LEFT colon diverticulosis without evidence diverticulitis. 4. Postsurgical change of the lumbar spine. Electronically Signed   By: Suzy Bouchard M.D.   On: 05/08/2019 08:37    Procedures Procedures (including critical care time)  Medications Ordered in ED Medications  sodium chloride 0.9 % bolus 1,000 mL (0 mLs Intravenous Stopped 05/08/19 0829)  famotidine (PEPCID) IVPB 20 mg premix (0 mg Intravenous Stopped 05/08/19 0829)  LORazepam (ATIVAN)  injection 1 mg (1 mg Intravenous Given 05/08/19 0725)  sodium chloride (PF) 0.9 % injection (  Given by Other 05/08/19 0919)  iohexol (OMNIPAQUE) 300 MG/ML solution 100 mL (100 mLs Intravenous Contrast Given 05/08/19 0804)  alum & mag hydroxide-simeth (MAALOX/MYLANTA) 200-200-20 MG/5ML suspension 30 mL (30 mLs Oral Given 05/08/19 [id number])    ED Course  I have reviewed the triage vital signs and the nursing notes.  Pertinent labs & imaging results that were available during my care of the patient were reviewed by me and considered in my medical decision making (see chart for details).    Victoria Levine was evaluated in Emergency Department on 05/08/2019 for the symptoms described in the history of present illness. He/she was evaluated in the context of the global COVID-19 pandemic, which necessitated consideration that the patient might be at risk for infection with the SARS-CoV-2 virus that causes COVID-19. Institutional protocols and algorithms that pertain to the evaluation of patients at risk for COVID-19 are in a state of rapid change based on information released by regulatory bodies including the CDC and federal and state organizations. These policies and algorithms were followed during the patient's care in the ED.  MDM Rules/Calculators/A&P                      Patient presents to the emergency department with complaints of nausea and vomiting with abdominal discomfort and a few  episodes of diarrhea. Patient is nontoxic appearing, vitals WNL with the exception of elevated BP. Generalized abdominal tenderness present with most prominent tenderness in the epigastrium, no peritoneal signs. Given history of EtOH abuse and drinking again over past few weeks CIWA checked and noted to be 10, will give 1 mg of Ativan with fluids with plan to check labs & CT A/P.   CBC: Anemia new compared to prior labs on review- subsequent fecal occult testing with chaperone present- DRE with soft brown stool, no  melena/hematochezia, fecal occult negative. No leukocytosis.  CMP: Hyperglycemia w/o acidosis or anion gap elevation. No significant electrolyte derangement. LFTs & renal function preserved.  Lipase: WNL UA: no UTI CT A/P:  1. No acute findings in the abdomen pelvis. 2. Moderate to large hiatal hernia. 3. LEFT colon diverticulosis without evidence diverticulitis. 4. Postsurgical change of the lumbar spine  Patient feeling improved, tolerating PO, repeat abdominal exam remains w/o peritoneal signs- doubt perf, obstruction, appendicitis, diverticulitis, cholecystitis. Seems most suspicious for alcoholic gastritis. Patient would like to stop drinking. Will treat with librium taper as well as zofran with PPI & carafate. Patient declined rehab information. I discussed results, treatment plan, need for follow-up, and return precautions with the patient. Provided opportunity for questions, patient confirmed understanding and is in agreement with plan.   Findings and plan of care discussed with supervising physician Dr. Zenia Resides who has evaluated patient & is in agreement.   Final Clinical Impression(s) / ED Diagnoses Final diagnoses:  Non-intractable vomiting with nausea, unspecified vomiting type    Rx / DC Orders ED Discharge Orders         Ordered    chlordiazePOXIDE (LIBRIUM) 25 MG capsule     05/08/19 0946    ondansetron (ZOFRAN ODT) 4 MG disintegrating tablet  Every 8 hours PRN     05/08/19 0946    sucralfate (CARAFATE) 1 GM/10ML suspension  3 times daily with meals & bedtime     05/08/19 0946    omeprazole (PRILOSEC) 20 MG capsule  Daily     05/08/19 0946           Amaryllis Dyke, PA-C 05/08/19 0953    Lacretia Leigh, MD 05/11/19 1213

## 2019-05-14 DIAGNOSIS — M545 Low back pain: Secondary | ICD-10-CM | POA: Diagnosis not present

## 2019-05-14 DIAGNOSIS — Z79899 Other long term (current) drug therapy: Secondary | ICD-10-CM | POA: Diagnosis not present

## 2019-05-14 DIAGNOSIS — G894 Chronic pain syndrome: Secondary | ICD-10-CM | POA: Diagnosis not present

## 2019-06-11 DIAGNOSIS — F1120 Opioid dependence, uncomplicated: Secondary | ICD-10-CM | POA: Diagnosis not present

## 2019-06-11 DIAGNOSIS — M545 Low back pain: Secondary | ICD-10-CM | POA: Diagnosis not present

## 2019-06-11 DIAGNOSIS — Z79899 Other long term (current) drug therapy: Secondary | ICD-10-CM | POA: Diagnosis not present

## 2019-06-11 DIAGNOSIS — Z1159 Encounter for screening for other viral diseases: Secondary | ICD-10-CM | POA: Diagnosis not present

## 2019-06-11 DIAGNOSIS — G894 Chronic pain syndrome: Secondary | ICD-10-CM | POA: Diagnosis not present

## 2019-07-01 DIAGNOSIS — F4310 Post-traumatic stress disorder, unspecified: Secondary | ICD-10-CM | POA: Diagnosis not present

## 2019-07-01 DIAGNOSIS — F319 Bipolar disorder, unspecified: Secondary | ICD-10-CM | POA: Diagnosis not present

## 2019-07-01 DIAGNOSIS — F4481 Dissociative identity disorder: Secondary | ICD-10-CM | POA: Diagnosis not present

## 2019-07-13 DIAGNOSIS — G894 Chronic pain syndrome: Secondary | ICD-10-CM | POA: Diagnosis not present

## 2019-07-13 DIAGNOSIS — M545 Low back pain: Secondary | ICD-10-CM | POA: Diagnosis not present

## 2019-07-13 DIAGNOSIS — Z79899 Other long term (current) drug therapy: Secondary | ICD-10-CM | POA: Diagnosis not present

## 2019-07-13 DIAGNOSIS — E559 Vitamin D deficiency, unspecified: Secondary | ICD-10-CM | POA: Diagnosis not present

## 2019-08-07 ENCOUNTER — Telehealth: Payer: Self-pay | Admitting: *Deleted

## 2019-08-07 NOTE — Telephone Encounter (Addendum)
Deductible N/A  OOP MAX $3900($382.17MET)  Annual exam 11/19/2018 [place]  Calcium  9.1           Date 05/08/2019  Upcoming dental procedures   Prior Authorization needed   Pt estimated Cost $222     APPT 08/12/2019 PT NO SHOWED FOR APPT CALLED TO R/S   Coverage Details: 20% ONE DOSE,20% ADMIN FEE

## 2019-08-12 ENCOUNTER — Ambulatory Visit: Payer: Medicare HMO

## 2019-08-30 ENCOUNTER — Observation Stay (HOSPITAL_COMMUNITY)
Admission: EM | Admit: 2019-08-30 | Discharge: 2019-09-04 | Disposition: A | Discharge status: Home / Self Care | Payer: Medicare HMO | Attending: Internal Medicine | Admitting: Internal Medicine

## 2019-08-30 ENCOUNTER — Other Ambulatory Visit: Payer: Self-pay

## 2019-08-30 ENCOUNTER — Encounter (HOSPITAL_COMMUNITY): Payer: Self-pay | Admitting: Emergency Medicine

## 2019-08-30 ENCOUNTER — Emergency Department (HOSPITAL_COMMUNITY): Payer: Medicare HMO

## 2019-08-30 DIAGNOSIS — R109 Unspecified abdominal pain: Secondary | ICD-10-CM | POA: Diagnosis not present

## 2019-08-30 DIAGNOSIS — Z79890 Hormone replacement therapy (postmenopausal): Secondary | ICD-10-CM | POA: Insufficient documentation

## 2019-08-30 DIAGNOSIS — F10930 Alcohol use, unspecified with withdrawal, uncomplicated: Secondary | ICD-10-CM

## 2019-08-30 DIAGNOSIS — E785 Hyperlipidemia, unspecified: Secondary | ICD-10-CM | POA: Insufficient documentation

## 2019-08-30 DIAGNOSIS — D509 Iron deficiency anemia, unspecified: Secondary | ICD-10-CM | POA: Diagnosis not present

## 2019-08-30 DIAGNOSIS — R531 Weakness: Secondary | ICD-10-CM | POA: Diagnosis not present

## 2019-08-30 DIAGNOSIS — R112 Nausea with vomiting, unspecified: Secondary | ICD-10-CM | POA: Diagnosis present

## 2019-08-30 DIAGNOSIS — E039 Hypothyroidism, unspecified: Secondary | ICD-10-CM | POA: Diagnosis not present

## 2019-08-30 DIAGNOSIS — R1011 Right upper quadrant pain: Secondary | ICD-10-CM | POA: Insufficient documentation

## 2019-08-30 DIAGNOSIS — R2681 Unsteadiness on feet: Secondary | ICD-10-CM | POA: Insufficient documentation

## 2019-08-30 DIAGNOSIS — R4182 Altered mental status, unspecified: Secondary | ICD-10-CM | POA: Diagnosis not present

## 2019-08-30 DIAGNOSIS — F1010 Alcohol abuse, uncomplicated: Secondary | ICD-10-CM | POA: Diagnosis not present

## 2019-08-30 DIAGNOSIS — F419 Anxiety disorder, unspecified: Secondary | ICD-10-CM | POA: Diagnosis not present

## 2019-08-30 DIAGNOSIS — Z79899 Other long term (current) drug therapy: Secondary | ICD-10-CM | POA: Insufficient documentation

## 2019-08-30 DIAGNOSIS — F319 Bipolar disorder, unspecified: Secondary | ICD-10-CM

## 2019-08-30 DIAGNOSIS — F10230 Alcohol dependence with withdrawal, uncomplicated: Secondary | ICD-10-CM

## 2019-08-30 DIAGNOSIS — K219 Gastro-esophageal reflux disease without esophagitis: Secondary | ICD-10-CM

## 2019-08-30 DIAGNOSIS — Z20822 Contact with and (suspected) exposure to covid-19: Secondary | ICD-10-CM | POA: Diagnosis not present

## 2019-08-30 DIAGNOSIS — R1013 Epigastric pain: Secondary | ICD-10-CM | POA: Diagnosis present

## 2019-08-30 DIAGNOSIS — R1084 Generalized abdominal pain: Secondary | ICD-10-CM

## 2019-08-30 LAB — CBC WITH DIFFERENTIAL/PLATELET
Abs Immature Granulocytes: 0.07 10*3/uL (ref 0.00–0.07)
Basophils Absolute: 0 10*3/uL (ref 0.0–0.1)
Basophils Relative: 0 %
Eosinophils Absolute: 0 10*3/uL (ref 0.0–0.5)
Eosinophils Relative: 0 %
HCT: 38 % (ref 36.0–46.0)
Hemoglobin: 11 g/dL — ABNORMAL LOW (ref 12.0–15.0)
Immature Granulocytes: 1 %
Lymphocytes Relative: 5 %
Lymphs Abs: 0.5 10*3/uL — ABNORMAL LOW (ref 0.7–4.0)
MCH: 21.9 pg — ABNORMAL LOW (ref 26.0–34.0)
MCHC: 28.9 g/dL — ABNORMAL LOW (ref 30.0–36.0)
MCV: 75.5 fL — ABNORMAL LOW (ref 80.0–100.0)
Monocytes Absolute: 0.2 10*3/uL (ref 0.1–1.0)
Monocytes Relative: 2 %
Neutro Abs: 8.9 10*3/uL — ABNORMAL HIGH (ref 1.7–7.7)
Neutrophils Relative %: 92 %
Platelets: 247 10*3/uL (ref 150–400)
RBC: 5.03 MIL/uL (ref 3.87–5.11)
RDW: 17.5 % — ABNORMAL HIGH (ref 11.5–15.5)
WBC: 9.7 10*3/uL (ref 4.0–10.5)
nRBC: 0 % (ref 0.0–0.2)

## 2019-08-30 LAB — COMPREHENSIVE METABOLIC PANEL
ALT: 14 U/L (ref 0–44)
AST: 19 U/L (ref 15–41)
Albumin: 4.4 g/dL (ref 3.5–5.0)
Alkaline Phosphatase: 86 U/L (ref 38–126)
Anion gap: 15 (ref 5–15)
BUN: 9 mg/dL (ref 8–23)
CO2: 22 mmol/L (ref 22–32)
Calcium: 9.5 mg/dL (ref 8.9–10.3)
Chloride: 103 mmol/L (ref 98–111)
Creatinine, Ser: 0.56 mg/dL (ref 0.44–1.00)
GFR calc Af Amer: >60 mL/min (ref >60)
GFR calc non Af Amer: >60 mL/min (ref >60)
Glucose, Bld: 161 mg/dL — ABNORMAL HIGH (ref 70–99)
Potassium: 4.3 mmol/L (ref 3.5–5.1)
Sodium: 140 mmol/L (ref 135–145)
Total Bilirubin: 0.6 mg/dL (ref 0.3–1.2)
Total Protein: 8.1 g/dL (ref 6.5–8.1)

## 2019-08-30 LAB — ETHANOL: Alcohol, Ethyl (B): <10 mg/dL (ref <10)

## 2019-08-30 LAB — LIPASE, BLOOD: Lipase: 23 U/L (ref 11–51)

## 2019-08-30 MED ORDER — ALUM & MAG HYDROXIDE-SIMETH 200-200-20 MG/5ML PO SUSP
30.0000 mL | Freq: Once | ORAL | Status: AC
  Administered 2019-08-30: 30 mL via ORAL
  Filled 2019-08-30: qty 30

## 2019-08-30 MED ORDER — MORPHINE SULFATE (PF) 2 MG/ML IV SOLN
2.0000 mg | Freq: Once | INTRAVENOUS | Status: AC
  Administered 2019-08-30: 2 mg via INTRAVENOUS
  Filled 2019-08-30: qty 1

## 2019-08-30 MED ORDER — ONDANSETRON HCL 4 MG/2ML IJ SOLN
4.0000 mg | Freq: Once | INTRAMUSCULAR | Status: AC
  Administered 2019-08-30: 4 mg via INTRAVENOUS
  Filled 2019-08-30: qty 2

## 2019-08-30 MED ORDER — SODIUM CHLORIDE (PF) 0.9 % IJ SOLN
INTRAMUSCULAR | Status: AC
  Filled 2019-08-30: qty 50

## 2019-08-30 MED ORDER — SODIUM CHLORIDE 0.9 % IV BOLUS
500.0000 mL | Freq: Once | INTRAVENOUS | Status: AC
  Administered 2019-08-30: 500 mL via INTRAVENOUS

## 2019-08-30 MED ORDER — FAMOTIDINE IN NACL 20-0.9 MG/50ML-% IV SOLN
20.0000 mg | Freq: Once | INTRAVENOUS | Status: AC
  Administered 2019-08-30: 20 mg via INTRAVENOUS
  Filled 2019-08-30: qty 50

## 2019-08-30 NOTE — ED Triage Notes (Signed)
69 yo female BIBA c/o n/v/d and generalized abdominal pain. Pt has a history of ETOH abuse. Pt endorses drinking 1 bottle of wine daily. PT states she did not have any drinks today but had too much to drink yesterday but was unclear as to how much she drank per EMS. Pt doesn't have any other complaints at this time per EMS

## 2019-08-30 NOTE — ED Notes (Signed)
Pt transported to CT ?

## 2019-08-30 NOTE — ED Provider Notes (Signed)
[facility] DEPT Provider Note   CSN: [id number] Arrival date & time: 08/30/19  2134     History Chief Complaint  Patient presents with  . Abdominal Pain  . Emesis  . Nausea    Victoria Levine is a 69 y.o. female with history of chronic back pain, GERD, hypothyroidism, migraine headaches, osteoporosis, alcohol abuse presenting for evaluation of acute onset, persistent generalized abdominal pain with nausea, vomiting, and diarrhea beginning today.  She reports that she had previously been sober for several years but in the midst of the COVID-19 pandemic she began drinking alcohol again.  She cannot tell me when she started drinking again but states that she typically drinks around 1 bottle of wine daily.  She reports that today after drinking a bottle of wine early in the morning she developed abdominal pain worse in the epigastrium but radiating all over.  She has had several episodes of nonbloody nonbilious emesis and watery nonbloody diarrhea.  Denies any sick contacts or suspicious food intake.  Has not tried anything for her symptoms.  Reports symptoms feel very similar to when she presented to the ED in February for the same thing and was thought to have alcoholic gastritis.  The history is provided by the patient.       Past Medical History:  Diagnosis Date  . Anemia    low iron  . Anxiety   . Asthma   . Bipolar disorder ([place])   . Chronic back pain   . Compression fracture of L2 lumbar vertebra ([facility])   . DDD (degenerative disc disease)   . Depression   . Dyspnea   . GERD (gastroesophageal reflux disease)   . H/O alcohol abuse    28 years sober  . H/O suicide attempt   . Herpes   . History of hiatal hernia   . Hypothyroidism   . Migraines   . Osteoporosis 11/2018   T score of -2.6  . Pneumonia   . S/P adenoidectomy   . S/P tonsillectomy     Patient Active Problem List   Diagnosis Date Noted  . Closed compression fracture of L2  vertebra ([place]) 12/11/2017  . Pain 07/26/2015  . Anxiety state 06/28/2015  . Benzodiazepine dependence ([place]) 10/13/2014  . Opioid abuse with opioid-induced mood disorder ([place]) 10/13/2014  . Bipolar 1 disorder, depressed ([place]) 10/12/2014  . Spondylolisthesis of lumbar region 07/28/2014  . Osteoporosis, unspecified 11/17/2013  . Migraines   . Herpes   . HYPOTHYROIDISM 08/13/2008  . HYPERCHOLESTEROLEMIA 08/13/2008  . BIPOLAR DISORDER UNSPECIFIED 08/13/2008  . CHRONIC OBSTRUCTIVE ASTHMA UNSPECIFIED 08/13/2008  . GASTROESOPHAGEAL REFLUX DISEASE 08/13/2008  . HIATAL HERNIA 08/13/2008  . ALLERGY 08/13/2008    Past Surgical History:  Procedure Laterality Date  . CESAREAN SECTION    . COLONOSCOPY     as a teenager  . Compression fracture spine    . DILATION AND CURETTAGE OF UTERUS    . EYE SURGERY Bilateral    CATARACTS REMOVED, NO LENS PLACED  . HYSTEROSCOPY    . KYPHOPLASTY N/A 12/19/2016   Procedure: THORACIC 12 KYPHOPLASTY; REQUEST 60 MINS AND FLIP ROOM;  Surgeon: Phylliss Bob, MD;  Location: [facility];  Service: Orthopedics;  Laterality: N/A;  THORACIC 12 KYPHOPLASTY; REQUEST 60 MINS AND FLIP ROOM  . KYPHOPLASTY N/A 12/11/2017   Procedure: LUMBAR 2 KYPHOPLASTY;  Surgeon: Phylliss Bob, MD;  Location: [facility];  Service: Orthopedics;  Laterality: N/A;  . LUMBAR FUSION    . TONSILLECTOMY  OB History    Gravida  3   Para  1   Term  1   Preterm      AB  2   Living  1     SAB      TAB      Ectopic      Multiple      Live Births              Family History  Adopted: Yes    Social History   Tobacco Use  . Smoking status: Never Smoker  . Smokeless tobacco: Never Used  Vaping Use  . Vaping Use: Never used  Substance Use Topics  . Alcohol use: No    Alcohol/week: 0.0 standard drinks    Comment: hx of heavy alcohol use - 28 years sober  . Drug use: Yes    Types: Benzodiazepines    Comment: hx of cocaine use in her 20's    Home Medications Prior to  Admission medications   Medication Sig Start Date End Date Taking? Authorizing Provider  acyclovir ointment (ZOVIRAX) 5 % Apply topically 3 (three) times daily. As needed. Patient not taking: Reported on 12/24/2018 05/01/17   [name], NP  albuterol (PROVENTIL HFA;VENTOLIN HFA) 108 (90 Base) MCG/ACT inhaler Inhale 2 puffs into the lungs every 6 (six) hours as needed for wheezing or shortness of breath.    [provider]  buPROPion (WELLBUTRIN XL) 150 MG 24 hr tablet Take 300 mg by mouth daily.  10/01/16   [provider]  calcium citrate-vitamin D (CALCIUM + D) 315-200 MG-UNIT per tablet Take 1 tablet by mouth daily. For bone health 10/18/14   Lindell Spar I, NP  chlordiazePOXIDE (LIBRIUM) 25 MG capsule 50mg  PO TID x 1D, then 25-50mg  PO BID X 1D, then 25-50mg  PO QD X 1D 05/08/19   Asna Muldrow R, PA-C  cyclobenzaprine (FLEXERIL) 10 MG tablet Take 10 mg by mouth 3 (three) times daily as needed for muscle spasms.     [provider]  denosumab (PROLIA) 60 MG/ML SOLN injection Inject 60 mg into the skin every 6 (six) months. Administer in upper arm, thigh, or abdomen  NEXT IS DUE 03-2017 Patient not taking: Reported on 11/19/2018 03/05/17   [name], NP  EPINEPHrine 0.3 mg/0.3 mL IJ SOAJ injection Inject 0.3 mg into the skin as needed for anaphylaxis. 04/10/19   [provider]  FLUoxetine (PROZAC) 20 MG capsule Take 20 mg by mouth 2 (two) times daily.     [provider]  gabapentin (NEURONTIN) 600 MG tablet Take 600 mg by mouth 3 (three) times daily.  11/05/16   [provider]  levothyroxine (SYNTHROID, LEVOTHROID) 112 MCG tablet Take 112 mcg by mouth daily before breakfast.  09/04/17   [provider]  methocarbamol (ROBAXIN) 500 MG tablet Take 500 mg by mouth 2 (two) times daily. 03/11/19   [provider]  Multiple Vitamin (MULTIVITAMIN) tablet Take 1 tablet by mouth daily. For low vitamin 10/18/14   Lindell Spar I, NP   naproxen sodium (ALEVE) 220 MG tablet Take 220-440 mg by mouth 2 (two) times daily as needed (for pain).    [provider]  omeprazole (PRILOSEC) 20 MG capsule Take 1 capsule (20 mg total) by mouth daily. 05/08/19   Declan Adamson R, PA-C  ondansetron (ZOFRAN ODT) 4 MG disintegrating tablet Take 1 tablet (4 mg total) by mouth every 8 (eight) hours as needed for nausea or vomiting. 05/08/19  Evaan Tidwell R, PA-C  ondansetron (ZOFRAN) 8 MG tablet Take 8 mg by mouth every 8 (eight) hours as needed for nausea or vomiting (Migraine Head Ache symptoms).    [provider]  oxcarbazepine (TRILEPTAL) 600 MG tablet Take 600 mg by mouth 2 (two) times daily.     [provider]  OxyCODONE ER (XTAMPZA ER) 13.5 MG C12A Take 13.5 mg by mouth 2 (two) times daily. 11/22/16   [provider]  Oxycodone HCl 10 MG TABS Take 10 mg by mouth daily as needed (for breakthrough pain).  09/16/17   [provider]  QUEtiapine (SEROQUEL) 100 MG tablet Take 100-200 mg by mouth at bedtime.  10/12/16   [provider]  rizatriptan (MAXALT-MLT) 10 MG disintegrating tablet Take 10 mg by mouth as needed for migraine.  11/08/14   [provider]  sucralfate (CARAFATE) 1 GM/10ML suspension Take 10 mLs (1 g total) by mouth 4 (four) times daily -  with meals and at bedtime. 05/08/19   Avery Klingbeil, Glynda Jaeger, PA-C  valACYclovir (VALTREX) 500 MG tablet Take one tablet twice daily 3-5 days then daily as needed. 05/01/17   [name], NP    Allergies    Prednisone, Lithium carbonate, and Sulfa antibiotics  Review of Systems   Review of Systems  Constitutional: Negative for chills and fever.  Respiratory: Negative for shortness of breath.   Cardiovascular: Negative for chest pain.  Gastrointestinal: Positive for abdominal pain, diarrhea, nausea and vomiting.  Genitourinary: Negative for dysuria, frequency, hematuria and urgency.  All other systems reviewed and  are negative.   Physical Exam Updated Vital Signs BP (!) 169/102   Pulse 80   Temp 97.6 F (36.4 C)   Resp 18   SpO2 97%   Physical Exam Vitals and nursing note reviewed.  Constitutional:      General: She is not in acute distress.    Appearance: She is well-developed.     Comments: Appears uncomfortable  HENT:     Head: Normocephalic and atraumatic.  Eyes:     General:        Right eye: No discharge.        Left eye: No discharge.     Conjunctiva/sclera: Conjunctivae normal.  Neck:     Vascular: No JVD.     Trachea: No tracheal deviation.  Cardiovascular:     Rate and Rhythm: Normal rate and regular rhythm.  Pulmonary:     Effort: Pulmonary effort is normal.     Breath sounds: Normal breath sounds.  Abdominal:     General: Abdomen is flat. Bowel sounds are normal. There is no distension.     Palpations: Abdomen is soft.     Tenderness: There is generalized abdominal tenderness and tenderness in the epigastric area. There is no right CVA tenderness, left CVA tenderness, guarding or rebound.  Skin:    General: Skin is warm and dry.     Findings: No erythema.  Neurological:     Mental Status: She is alert.  Psychiatric:        Behavior: Behavior normal.     ED Results / Procedures / Treatments   Labs (all labs ordered are listed, but only abnormal results are displayed) Labs Reviewed  CBC WITH DIFFERENTIAL/PLATELET - Abnormal; Notable for the following components:      Result Value   Hemoglobin 11.0 (*)    MCV 75.5 (*)    MCH 21.9 (*)    MCHC 28.9 (*)  RDW 17.5 (*)    Neutro Abs 8.9 (*)    Lymphs Abs 0.5 (*)    All other components within normal limits  COMPREHENSIVE METABOLIC PANEL - Abnormal; Notable for the following components:   Glucose, Bld 161 (*)    All other components within normal limits  LIPASE, BLOOD  ETHANOL  URINALYSIS, ROUTINE W REFLEX MICROSCOPIC    EKG EKG Interpretation  Date/Time:  Sunday August 30 2019 22:02:00 EDT  Ventricular Rate:  72 PR Interval:    QRS Duration: 114 QT Interval:  396 QTC Calculation: 434 R Axis:   40 Text Interpretation: Sinus arrhythmia Incomplete right bundle branch block Low voltage, precordial leads No STEMI Confirmed by Octaviano Glow [phone]) on 08/30/2019 10:40:45 PM   Radiology No results found.  Procedures Procedures (including critical care time)  Medications Ordered in ED Medications  sodium chloride (PF) 0.9 % injection (has no administration in time range)  LORazepam (ATIVAN) injection 0-4 mg (has no administration in time range)    Or  LORazepam (ATIVAN) tablet 0-4 mg (has no administration in time range)  LORazepam (ATIVAN) injection 0-4 mg (has no administration in time range)    Or  LORazepam (ATIVAN) tablet 0-4 mg (has no administration in time range)  thiamine tablet 100 mg (has no administration in time range)    Or  thiamine (B-1) injection 100 mg (has no administration in time range)  ondansetron (ZOFRAN) injection 4 mg (4 mg Intravenous Given 08/30/19 2305)  sodium chloride 0.9 % bolus 500 mL (500 mLs Intravenous New Bag/Given (Non-Interop) 08/30/19 2305)  famotidine (PEPCID) IVPB 20 mg premix (20 mg Intravenous New Bag/Given 08/30/19 2311)  alum & mag hydroxide-simeth (MAALOX/MYLANTA) 200-200-20 MG/5ML suspension 30 mL (30 mLs Oral Given 08/30/19 2311)  morphine 2 MG/ML injection 2 mg (2 mg Intravenous Given 08/30/19 2305)    ED Course  I have reviewed the triage vital signs and the nursing notes.  Pertinent labs & imaging results that were available during my care of the patient were reviewed by me and considered in my medical decision making (see chart for details).  Clinical Course as of Aug 31 15  Sun Aug 30, 5915  [id number] 69 year old female with a history of alcoholic gastritis, has not had a drink in many years, presented ED with epigastric pain after drinking about a bottle of wine today.  Her pain is epigastric.  She also feels extremely  nauseated and vomited.  On my exam the patient is curled up on her side and allow me to perform a flat abdominal exam.  Labs are ordered which show an unremarkable CMP, normal lipase, undetectable ethanol level, no leukocytosis.  I suspect this may be alcoholic gastritis, but given my limited physical exam and the patient's level of distress, I do think a CT scan of the abdomen would be reasonable to rule out abdominal perforation or other surgical emergency.   [MT]    Clinical Course User Index [MT] Wyvonnia Dusky, MD   MDM Rules/Calculators/A&P                          Patient presenting for evaluation of sudden onset abdominal pain with associated nausea, vomiting, diarrhea.  Recently began drinking again.  Symptoms began after drinking a bottle of wine per her report.  She is afebrile, appears uncomfortable and is hypertensive though had some improvement in her blood pressures with management of her symptoms.  Abdomen is soft, maximally tender  to palpation in the epigastrium though she endorses generalized tenderness.  She is not actively vomiting.  EKG shows no acute ischemic abnormalities and I doubt atypical ACS/MI.  Will obtain lab work and CT abdomen and pelvis to rule out acute surgical abdominal pathology.  Lab work reviewed and interpreted by myself shows no leukocytosis, stable anemia, no renal insufficiency or metabolic derangements.  Lipase and LFTs are within normal limits.  Patient was in the process of receiving her CT scan when she began actively vomiting so she was brought back into her room and was given Zofran, morphine, GI cocktail, Pepcid, IV fluids.  Her ethanol level is negative, will initiate CIWA protocol.  She does not appear to be withdrawing from alcohol at this time.  12:19 AM Signed out to oncoming provider PA McDonald.  Pending CT scan and p.o. challenge.  If patient's imaging is unremarkable and she is tolerating p.o. fluids then she will likely be stable for  discharge home with symptomatic management and outpatient PCP or GI follow-up.  Alternatively may require admission to the hospital if her imaging is concerning for acute surgical pathology or serious etiology of her symptoms or if her vomiting is uncontrolled.    Final Clinical Impression(s) / ED Diagnoses Final diagnoses:  Generalized abdominal pain  Nausea vomiting and diarrhea    Rx / DC Orders ED Discharge Orders    None       Renita Papa, PA-C 08/31/19 0019    Wyvonnia Dusky, MD 08/31/19 1322

## 2019-08-31 ENCOUNTER — Encounter (HOSPITAL_COMMUNITY): Payer: Self-pay

## 2019-08-31 ENCOUNTER — Observation Stay (HOSPITAL_COMMUNITY): Payer: Medicare HMO

## 2019-08-31 ENCOUNTER — Emergency Department (HOSPITAL_COMMUNITY): Payer: Medicare HMO

## 2019-08-31 DIAGNOSIS — R1013 Epigastric pain: Secondary | ICD-10-CM | POA: Diagnosis present

## 2019-08-31 DIAGNOSIS — D509 Iron deficiency anemia, unspecified: Secondary | ICD-10-CM | POA: Diagnosis present

## 2019-08-31 DIAGNOSIS — K219 Gastro-esophageal reflux disease without esophagitis: Secondary | ICD-10-CM

## 2019-08-31 DIAGNOSIS — R112 Nausea with vomiting, unspecified: Secondary | ICD-10-CM | POA: Diagnosis not present

## 2019-08-31 DIAGNOSIS — F1010 Alcohol abuse, uncomplicated: Secondary | ICD-10-CM | POA: Diagnosis present

## 2019-08-31 LAB — BLOOD GAS, ARTERIAL
Acid-Base Excess: 1.4 mmol/L (ref 0.0–2.0)
Bicarbonate: 25.4 mmol/L (ref 20.0–28.0)
Drawn by: 23281
FIO2: 28
O2 Content: 2 L/min
O2 Saturation: 97 %
Patient temperature: 99
pCO2 arterial: 40.2 mmHg (ref 32.0–48.0)
pH, Arterial: 7.418 (ref 7.350–7.450)
pO2, Arterial: 88.7 mmHg (ref 83.0–108.0)

## 2019-08-31 LAB — CBC WITH DIFFERENTIAL/PLATELET
Abs Immature Granulocytes: 0.04 10*3/uL (ref 0.00–0.07)
Basophils Absolute: 0 10*3/uL (ref 0.0–0.1)
Basophils Relative: 0 %
Eosinophils Absolute: 0 10*3/uL (ref 0.0–0.5)
Eosinophils Relative: 0 %
HCT: 36.3 % (ref 36.0–46.0)
Hemoglobin: 10.4 g/dL — ABNORMAL LOW (ref 12.0–15.0)
Immature Granulocytes: 0 %
Lymphocytes Relative: 7 %
Lymphs Abs: 0.9 10*3/uL (ref 0.7–4.0)
MCH: 21.5 pg — ABNORMAL LOW (ref 26.0–34.0)
MCHC: 28.7 g/dL — ABNORMAL LOW (ref 30.0–36.0)
MCV: 75 fL — ABNORMAL LOW (ref 80.0–100.0)
Monocytes Absolute: 0.9 10*3/uL (ref 0.1–1.0)
Monocytes Relative: 7 %
Neutro Abs: 10.2 10*3/uL — ABNORMAL HIGH (ref 1.7–7.7)
Neutrophils Relative %: 86 %
Platelets: 417 10*3/uL — ABNORMAL HIGH (ref 150–400)
RBC: 4.84 MIL/uL (ref 3.87–5.11)
RDW: 17.7 % — ABNORMAL HIGH (ref 11.5–15.5)
WBC: 12 10*3/uL — ABNORMAL HIGH (ref 4.0–10.5)
nRBC: 0 % (ref 0.0–0.2)

## 2019-08-31 LAB — COMPREHENSIVE METABOLIC PANEL
ALT: 13 U/L (ref 0–44)
AST: 18 U/L (ref 15–41)
Albumin: 4.2 g/dL (ref 3.5–5.0)
Alkaline Phosphatase: 86 U/L (ref 38–126)
Anion gap: 12 (ref 5–15)
BUN: 7 mg/dL — ABNORMAL LOW (ref 8–23)
CO2: 27 mmol/L (ref 22–32)
Calcium: 8.8 mg/dL — ABNORMAL LOW (ref 8.9–10.3)
Chloride: 98 mmol/L (ref 98–111)
Creatinine, Ser: 0.62 mg/dL (ref 0.44–1.00)
GFR calc Af Amer: >60 mL/min (ref >60)
GFR calc non Af Amer: >60 mL/min (ref >60)
Glucose, Bld: 131 mg/dL — ABNORMAL HIGH (ref 70–99)
Potassium: 3.3 mmol/L — ABNORMAL LOW (ref 3.5–5.1)
Sodium: 137 mmol/L (ref 135–145)
Total Bilirubin: 0.8 mg/dL (ref 0.3–1.2)
Total Protein: 8 g/dL (ref 6.5–8.1)

## 2019-08-31 LAB — URINALYSIS, ROUTINE W REFLEX MICROSCOPIC
Bilirubin Urine: NEGATIVE
Glucose, UA: 50 mg/dL — AB
Ketones, ur: 5 mg/dL — AB
Leukocytes,Ua: NEGATIVE
Nitrite: NEGATIVE
Protein, ur: 30 mg/dL — AB
Specific Gravity, Urine: 1.01 (ref 1.005–1.030)
pH: 8 (ref 5.0–8.0)

## 2019-08-31 LAB — IRON AND TIBC
Iron: 28 ug/dL (ref 28–170)
Saturation Ratios: 5 % — ABNORMAL LOW (ref 10.4–31.8)
TIBC: 582 ug/dL — ABNORMAL HIGH (ref 250–450)
UIBC: 554 ug/dL

## 2019-08-31 LAB — HEMOGLOBIN A1C
Hgb A1c MFr Bld: 5.9 % — ABNORMAL HIGH (ref 4.8–5.6)
Mean Plasma Glucose: 122.63 mg/dL

## 2019-08-31 LAB — SARS CORONAVIRUS 2 BY RT PCR (HOSPITAL ORDER, PERFORMED IN ~~LOC~~ HOSPITAL LAB): SARS Coronavirus 2: NEGATIVE

## 2019-08-31 LAB — VITAMIN B12: Vitamin B-12: 406 pg/mL (ref 180–914)

## 2019-08-31 LAB — AMMONIA: Ammonia: 18 umol/L (ref 9–35)

## 2019-08-31 LAB — TSH: TSH: 2.086 u[IU]/mL (ref 0.350–4.500)

## 2019-08-31 LAB — HIV ANTIBODY (ROUTINE TESTING W REFLEX): HIV Screen 4th Generation wRfx: NONREACTIVE

## 2019-08-31 LAB — FOLATE: Folate: 6.3 ng/mL (ref >5.9)

## 2019-08-31 LAB — MAGNESIUM: Magnesium: 1.9 mg/dL (ref 1.7–2.4)

## 2019-08-31 MED ORDER — METHOCARBAMOL 500 MG PO TABS
500.0000 mg | ORAL_TABLET | Freq: Two times a day (BID) | ORAL | Status: DC
  Administered 2019-08-31 – 2019-09-04 (×7): 500 mg via ORAL
  Filled 2019-08-31 (×7): qty 1

## 2019-08-31 MED ORDER — OXCARBAZEPINE 300 MG PO TABS
600.0000 mg | ORAL_TABLET | Freq: Two times a day (BID) | ORAL | Status: DC
  Administered 2019-08-31 – 2019-09-04 (×8): 600 mg via ORAL
  Filled 2019-08-31 (×10): qty 2

## 2019-08-31 MED ORDER — MORPHINE SULFATE (PF) 2 MG/ML IV SOLN
2.0000 mg | INTRAVENOUS | Status: DC | PRN
  Administered 2019-08-31: 2 mg via INTRAVENOUS
  Filled 2019-08-31: qty 1

## 2019-08-31 MED ORDER — LORAZEPAM 1 MG PO TABS
0.0000 mg | ORAL_TABLET | Freq: Two times a day (BID) | ORAL | Status: AC
  Administered 2019-09-03: 1 mg via ORAL
  Filled 2019-08-31: qty 1

## 2019-08-31 MED ORDER — METOCLOPRAMIDE HCL 5 MG/ML IJ SOLN
10.0000 mg | Freq: Once | INTRAMUSCULAR | Status: AC
  Administered 2019-08-31: 10 mg via INTRAVENOUS
  Filled 2019-08-31: qty 2

## 2019-08-31 MED ORDER — DIPHENHYDRAMINE HCL 50 MG/ML IJ SOLN
12.5000 mg | Freq: Once | INTRAMUSCULAR | Status: AC
  Administered 2019-08-31: 12.5 mg via INTRAVENOUS
  Filled 2019-08-31: qty 1

## 2019-08-31 MED ORDER — LEVOTHYROXINE SODIUM 112 MCG PO TABS
112.0000 ug | ORAL_TABLET | Freq: Every day | ORAL | Status: DC
  Administered 2019-09-02 – 2019-09-04 (×3): 112 ug via ORAL
  Filled 2019-08-31 (×3): qty 1

## 2019-08-31 MED ORDER — ALBUTEROL SULFATE (2.5 MG/3ML) 0.083% IN NEBU: 3.0000 mL | INHALATION_SOLUTION | Freq: Four times a day (QID) | RESPIRATORY_TRACT | Status: DC | PRN

## 2019-08-31 MED ORDER — ACETAMINOPHEN 325 MG PO TABS
650.0000 mg | ORAL_TABLET | Freq: Four times a day (QID) | ORAL | Status: DC | PRN
  Administered 2019-09-01 – 2019-09-04 (×2): 650 mg via ORAL
  Filled 2019-08-31 (×2): qty 2

## 2019-08-31 MED ORDER — LORAZEPAM 2 MG/ML IJ SOLN: 0.0000 mg | Freq: Two times a day (BID) | INTRAMUSCULAR | Status: AC

## 2019-08-31 MED ORDER — LORAZEPAM 1 MG PO TABS
0.0000 mg | ORAL_TABLET | Freq: Four times a day (QID) | ORAL | Status: AC
  Administered 2019-08-31: 1 mg via ORAL
  Filled 2019-08-31: qty 1

## 2019-08-31 MED ORDER — GABAPENTIN 300 MG PO CAPS
600.0000 mg | ORAL_CAPSULE | Freq: Three times a day (TID) | ORAL | Status: DC
  Administered 2019-08-31 – 2019-09-04 (×9): 600 mg via ORAL
  Filled 2019-08-31 (×9): qty 2

## 2019-08-31 MED ORDER — SODIUM CHLORIDE 0.9 % IV BOLUS
1000.0000 mL | Freq: Once | INTRAVENOUS | Status: AC
  Administered 2019-08-31: 1000 mL via INTRAVENOUS

## 2019-08-31 MED ORDER — BUPROPION HCL ER (XL) 300 MG PO TB24
300.0000 mg | ORAL_TABLET | Freq: Every day | ORAL | Status: DC
  Administered 2019-09-01 – 2019-09-04 (×4): 300 mg via ORAL
  Filled 2019-08-31 (×4): qty 1

## 2019-08-31 MED ORDER — MORPHINE SULFATE (PF) 4 MG/ML IV SOLN: 4.0000 mg | INTRAVENOUS | Status: DC | PRN

## 2019-08-31 MED ORDER — THIAMINE HCL 100 MG PO TABS
100.0000 mg | ORAL_TABLET | Freq: Every day | ORAL | Status: DC
  Administered 2019-08-31 – 2019-09-04 (×5): 100 mg via ORAL
  Filled 2019-08-31 (×5): qty 1

## 2019-08-31 MED ORDER — PROCHLORPERAZINE EDISYLATE 10 MG/2ML IJ SOLN
10.0000 mg | Freq: Four times a day (QID) | INTRAMUSCULAR | Status: DC | PRN
  Administered 2019-08-31 – 2019-09-03 (×4): 10 mg via INTRAVENOUS
  Filled 2019-08-31 (×4): qty 2

## 2019-08-31 MED ORDER — PANTOPRAZOLE SODIUM 40 MG IV SOLR
40.0000 mg | Freq: Two times a day (BID) | INTRAVENOUS | Status: DC
  Administered 2019-08-31 – 2019-09-01 (×4): 40 mg via INTRAVENOUS
  Filled 2019-08-31 (×4): qty 40

## 2019-08-31 MED ORDER — QUETIAPINE FUMARATE 100 MG PO TABS
100.0000 mg | ORAL_TABLET | Freq: Every day | ORAL | Status: DC
  Administered 2019-08-31: 200 mg via ORAL
  Filled 2019-08-31: qty 2

## 2019-08-31 MED ORDER — LACTATED RINGERS IV SOLN: INTRAVENOUS | Status: AC

## 2019-08-31 MED ORDER — POLYETHYLENE GLYCOL 3350 17 G PO PACK: 17.0000 g | PACK | Freq: Every day | ORAL | Status: DC | PRN

## 2019-08-31 MED ORDER — MORPHINE SULFATE (PF) 4 MG/ML IV SOLN
4.0000 mg | Freq: Once | INTRAVENOUS | Status: AC
  Administered 2019-08-31: 4 mg via INTRAVENOUS
  Filled 2019-08-31: qty 1

## 2019-08-31 MED ORDER — FLUOXETINE HCL 20 MG PO CAPS
20.0000 mg | ORAL_CAPSULE | Freq: Two times a day (BID) | ORAL | Status: DC
  Administered 2019-08-31 – 2019-09-04 (×8): 20 mg via ORAL
  Filled 2019-08-31 (×8): qty 1

## 2019-08-31 MED ORDER — IOHEXOL 300 MG/ML  SOLN
100.0000 mL | Freq: Once | INTRAMUSCULAR | Status: AC | PRN
  Administered 2019-08-31: 100 mL via INTRAVENOUS

## 2019-08-31 MED ORDER — OXYCODONE ER 13.5 MG PO C12A: 13.5000 mg | EXTENDED_RELEASE_CAPSULE | Freq: Two times a day (BID) | ORAL | Status: DC

## 2019-08-31 MED ORDER — LORAZEPAM 2 MG/ML IJ SOLN
0.0000 mg | Freq: Four times a day (QID) | INTRAMUSCULAR | Status: AC
  Administered 2019-08-31 (×2): 1 mg via INTRAVENOUS
  Administered 2019-08-31: 2 mg via INTRAVENOUS
  Filled 2019-08-31 (×3): qty 1

## 2019-08-31 MED ORDER — ACETAMINOPHEN 650 MG RE SUPP: 650.0000 mg | Freq: Four times a day (QID) | RECTAL | Status: DC | PRN

## 2019-08-31 MED ORDER — THIAMINE HCL 100 MG/ML IJ SOLN: 100.0000 mg | Freq: Every day | INTRAMUSCULAR | Status: DC

## 2019-08-31 NOTE — ED Provider Notes (Signed)
69 year old female received at sign out from [facility]. Per her HPI:   "Victoria Levine is a 69 y.o. female with history of chronic back pain, GERD, hypothyroidism, migraine headaches, osteoporosis, alcohol abuse presenting for evaluation of acute onset, persistent generalized abdominal pain with nausea, vomiting, and diarrhea beginning today.  She reports that she had previously been sober for several years but in the midst of the COVID-19 pandemic she began drinking alcohol again.  She cannot tell me when she started drinking again but states that she typically drinks around 1 bottle of wine daily.  She reports that today after drinking a bottle of wine early in the morning she developed abdominal pain worse in the epigastrium but radiating all over.  She has had several episodes of nonbloody nonbilious emesis and watery nonbloody diarrhea.  Denies any sick contacts or suspicious food intake.  Has not tried anything for her symptoms.  Reports symptoms feel very similar to when she presented to the ED in February for the same thing and was thought to have alcoholic gastritis.  The history is provided by the patient."   Physical Exam  BP 139/84   Pulse 78   Temp 97.6 F (36.4 C)   Resp 20   Ht 5\' 4"  (1.626 m)   Wt 73 kg   SpO2 91%   BMI 27.62 kg/m   Physical Exam Vitals and nursing note reviewed.  Constitutional:      Appearance: She is well-developed.     Comments: Curled up in the fetal position and complaining of pain before actively retching in an emesis bag.  HENT:     Head: Normocephalic and atraumatic.  Abdominal:     Tenderness: There is abdominal tenderness.     Comments: Tender to palpation in the epigastric region.  There is voluntary guarding, but no rebound.  Musculoskeletal:        General: Normal range of motion.     Cervical back: Normal range of motion.  Neurological:     Mental Status: She is alert.     Comments: No tremor. Oriented to person, time, and place.       ED Course/Procedures   Clinical Course as of Aug 30 828  Sun Jun 13, 10061  [id number] 69 year old female with a history of alcoholic gastritis, has not had a drink in many years, presented ED with epigastric pain after drinking about a bottle of wine today.  Her pain is epigastric.  She also feels extremely nauseated and vomited.  On my exam the patient is curled up on her side and allow me to perform a flat abdominal exam.  Labs are ordered which show an unremarkable CMP, normal lipase, undetectable ethanol level, no leukocytosis.  I suspect this may be alcoholic gastritis, but given my limited physical exam and the patient's level of distress, I do think a CT scan of the abdomen would be reasonable to rule out abdominal perforation or other surgical emergency.   [MT]    Clinical Course User Index [MT] Wyvonnia Dusky, MD    Procedures  MDM    69 year old female with history of chronic back pain, GERD, hypothyroidism, migraine headaches, osteoporosis, alcohol abuse that was in remission for 28 years until sometime in the last year when she began drinking approximately 1 bottle of wine daily.  The patient was received a signout from [facility] pending CT abdomen pelvis, fluid challenge, and his CIWA score.  Please see her note for further work-up and  medical decision making.  Initial CIWA score was 8.  CIWA protocol ordered. CT with diffuse fatty infiltration of the liver.  No evidence of pancreatitis.  She has diverticulosis, but no diverticulitis.  There is a large hiatal hernia.  No other acute findings on my evaluation.  On reevaluation, patient attempted fluid challenge, but was unable to drink more than a few sips of water.  She is curled up in the fetal position and actively retching and an emesis bag.  She remains very tender to palpation in the epigastric region.  It is likely that this is alcoholic gastritis.  I am concerned for intractable nausea and vomiting in the setting of  intractable abdominal pain.  Patient is also at high risk for clinical decompensation secondary to alcohol withdrawal.  Thus, she would benefit from admission.  Consult to the hospitalist team and Dr. Cyd Silence will accept the patient for admission. The patient appears reasonably stabilized for admission considering the current resources, flow, and capabilities available in the ED at this time, and I doubt any other [facility] requiring further screening and/or treatment in the ED prior to admission.    [name], PA-C 08/31/19 0830    [place], Jenny Reichmann, MD 09/01/19 [id number]

## 2019-08-31 NOTE — Progress Notes (Signed)
PROGRESS NOTE    Victoria Levine  JKK:[id number] DOB: 1950/09/25 DOA: 08/30/2019 PCP: [name], MD   Brief Narrative:  69 year old with history of bipolar disorder, HLD, GERD, hypothyroidism, alcohol use initially presented to [facility], ER with complaints of abdominal pain and nausea.  Apparently patient has been a heavy drinker.  Routine lab work within normal limits, CT abdomen pelvis was negative for any acute pathology.   Assessment & Plan:   Principal Problem:   Intractable nausea and vomiting Active Problems:   Hypothyroidism   Bipolar disorder (HCC)   GERD (gastroesophageal reflux disease)   Epigastric pain   Alcohol abuse   Microcytic anemia  Intractable nausea and vomiting, improved, epigastric pain-suspect alcohol gastritis -Lipase normal, CT abdomen pelvis-fatty infiltrate otherwise no evidence of pancreatitis.  LFTs within normal limits, right upper quadrant ultrasound-cholelithiasis without evidence of cholecystitis.  UA-negative. -Continue PPI twice daily. -GI consulted-in case if she requires endoscopic evaluation.  Microcytic anemia -No clear evidence of melanotic stool.  I am unable to find any endoscopic or colonoscopy evaluation.  Check iron studies, TSH, B12 and folate.  We will start her on aggressive repletion as necessary.  Altered Mental Status, non focal exam.  -CT of the head-negative, show small calcified hemangioma.  Her exam is mostly nonfocal.  We will continue neurochecks -Order ammonia levels to rule out hepatic encephalopathy in the setting of alcohol use -Check TSH  Hypothyroidism -Check TSH.  Awaiting preadmission med rec to be completed  Bipolar disease -Awaiting preadmission med rec to be completed   DVT prophylaxis: SCDs Code Status: Presumed full code Family Communication: Attempted to call daughter, no response  Status is: Observation  The patient remains OBS appropriate and will d/c before 2 midnights.  Dispo: The  patient is from: Home              Anticipated d/c is to: To be determined              Anticipated d/c date is: 1 day              Patient currently is not medically stable to d/c.  Ongoing evaluation for intractable nausea vomiting and microcytic anemia.   Subjective: Patient was alert to her name during my evaluation but was having quite a bit of nausea.  Overall she is a poor historian.  Review of Systems Otherwise negative except as per HPI, including: Difficult to obtain  Examination: Constitutional: Not in acute distress Respiratory: Clear to auscultation bilaterally Cardiovascular: Normal sinus rhythm, no rubs Abdomen: Nontender nondistended good bowel sounds Musculoskeletal: No edema noted Skin: No rashes seen Neurologic: CN 2-12 grossly intact.  And nonfocal Psychiatric: Poor judgment and insight  Objective: Vitals:   08/31/19 0917 08/31/19 0918 08/31/19 0921 08/31/19 1113  BP:   (!) 158/92 (!) 156/93  Pulse:   64 82  Resp:   17 17  Temp:      SpO2: (!) 87% 100% 98% 99%  Weight:      Height:        Intake/Output Summary (Last 24 hours) at 08/31/2019 1212 Last data filed at 08/31/2019 1154 Gross per 24 hour  Intake 2387.83 ml  Output --  Net 2387.83 ml   Filed Weights   08/31/19 0340  Weight: 73 kg     Data Reviewed:   CBC: Recent Labs  Lab 08/30/19 2215  WBC 9.7  NEUTROABS 8.9*  HGB 11.0*  HCT 38.0  MCV 75.5*  PLT 247  Basic Metabolic Panel: Recent Labs  Lab 08/30/19 2215  NA 140  K 4.3  CL 103  CO2 22  GLUCOSE 161*  BUN 9  CREATININE 0.56  CALCIUM 9.5   GFR: Estimated Creatinine Clearance: 64 mL/min (by C-G formula based on SCr of 0.56 mg/dL). Liver Function Tests: Recent Labs  Lab 08/30/19 2215  AST 19  ALT 14  ALKPHOS 86  BILITOT 0.6  PROT 8.1  ALBUMIN 4.4   Recent Labs  Lab 08/30/19 2215  LIPASE 23   No results for input(s): AMMONIA in the last 168 hours. Coagulation Profile: No results for input(s): INR,  PROTIME in the last 168 hours. Cardiac Enzymes: No results for input(s): CKTOTAL, CKMB, CKMBINDEX, TROPONINI in the last 168 hours. BNP (last 3 results) No results for input(s): PROBNP in the last 8760 hours. HbA1C: Recent Labs    08/30/19 2215  HGBA1C 5.9*   CBG: No results for input(s): GLUCAP in the last 168 hours. Lipid Profile: No results for input(s): CHOL, HDL, LDLCALC, TRIG, CHOLHDL, LDLDIRECT in the last 72 hours. Thyroid Function Tests: No results for input(s): TSH, T4TOTAL, FREET4, T3FREE, THYROIDAB in the last 72 hours. Anemia Panel: No results for input(s): VITAMINB12, FOLATE, FERRITIN, TIBC, IRON, RETICCTPCT in the last 72 hours. Sepsis Labs: No results for input(s): PROCALCITON, LATICACIDVEN in the last 168 hours.  Recent Results (from the past 240 hour(s))  SARS Coronavirus 2 by RT PCR (hospital order, performed in [facility] hospital lab) Nasopharyngeal Nasopharyngeal Swab     Status: None   Collection Time: 08/31/19  3:43 AM   Specimen: Nasopharyngeal Swab  Result Value Ref Range Status   SARS Coronavirus 2 NEGATIVE NEGATIVE Final    Comment: (NOTE) SARS-CoV-2 target nucleic acids are NOT DETECTED.  The SARS-CoV-2 RNA is generally detectable in upper and lower respiratory specimens during the acute phase of infection. The lowest concentration of SARS-CoV-2 viral copies this assay can detect is 250 copies / mL. A negative result does not preclude SARS-CoV-2 infection and should not be used as the sole basis for treatment or other patient management decisions.  A negative result may occur with improper specimen collection / handling, submission of specimen other than nasopharyngeal swab, presence of viral mutation(s) within the areas targeted by this assay, and inadequate number of viral copies (<250 copies / mL). A negative result must be combined with clinical observations, patient history, and epidemiological information.  Fact Sheet for Patients:     StrictlyIdeas.no  Fact Sheet for Healthcare Providers: BankingDealers.co.za  This test is not yet approved or  cleared by the [place] FDA and has been authorized for detection and/or diagnosis of SARS-CoV-2 by FDA under an Emergency Use Authorization (EUA).  This EUA will remain in effect (meaning this test can be used) for the duration of the COVID-19 declaration under Section 564(b)(1) of the Act, 21 U.S.C. section 360bbb-3(b)(1), unless the authorization is terminated or revoked sooner.  Performed at [facility], [place] [address], [place], [place] [address]          Radiology Studies: CT HEAD WO CONTRAST  Result Date: 08/31/2019 CLINICAL DATA:  Altered mental status with unclear cause EXAM: CT HEAD WITHOUT CONTRAST TECHNIQUE: Contiguous axial images were obtained from the base of the skull through the vertex without intravenous contrast. COMPARISON:  11/12/2013 FINDINGS: Brain: No evidence of acute infarction, hemorrhage, hydrocephalus, extra-axial collection or mass effect. 1.4 cm nodular calcification in the left para median high vertex, stable and most consistent  with meningioma. Possible small remote right superior cerebellar infarction. Vascular: Atherosclerotic calcification Skull: Rounded ground-glass lesion in the paramedian left frontal bone that is benign and stable from 2015. Sinuses/Orbits: Bilateral cataract resection IMPRESSION: 1. No acute finding. 2. Incidental, and small calcified meningioma at the vertex. Electronically Signed   By: Monte Fantasia M.D.   On: 08/31/2019 06:35   CT ABDOMEN PELVIS W CONTRAST  Result Date: 08/31/2019 CLINICAL DATA:  Pancreatitis suspected EXAM: CT ABDOMEN AND PELVIS WITH CONTRAST TECHNIQUE: Multidetector CT imaging of the abdomen and pelvis was performed using the standard protocol following bolus administration of intravenous contrast. CONTRAST:  167mL OMNIPAQUE  IOHEXOL 300 MG/ML  SOLN COMPARISON:  05/08/2019 FINDINGS: Lower chest: Large hiatal hernia.  No acute abnormality. Hepatobiliary: Diffuse low-density throughout the liver compatible with fatty infiltration. No focal abnormality. Gallbladder unremarkable. Pancreas: No focal abnormality or ductal dilatation. No surrounding inflammation to suggest acute pancreatitis. Spleen: No focal abnormality.  Normal size. Adrenals/Urinary Tract: Stable small hypodensity in the upper pole of the left kidney compatible with small cyst. No hydronephrosis. Adrenal glands and urinary bladder unremarkable. Stomach/Bowel: Colonic diverticulosis. No active diverticulitis. Stomach and small bowel decompressed. Vascular/Lymphatic: Aortic atherosclerosis. No enlarged abdominal or pelvic lymph nodes. Reproductive: Uterus and adnexa unremarkable.  No mass. Other: No free fluid or free air. Musculoskeletal: No acute bony abnormality. Postoperative changes in the lumbar spine. IMPRESSION: Diffuse fatty infiltration of the liver. No evidence of acute or chronic pancreatitis by CT. Large hiatal hernia. Aortic atherosclerosis. Colonic diverticulosis. Electronically Signed   By: Rolm Baptise M.D.   On: 08/31/2019 00:46   US Abdomen Limited RUQ  Result Date: 08/31/2019 CLINICAL DATA:  Abdominal pain and altered mental status EXAM: ULTRASOUND ABDOMEN LIMITED RIGHT UPPER QUADRANT COMPARISON:  Abdominal CT from earlier today FINDINGS: Gallbladder: Nonshadowing but discrete echogenic ovoid structures in the gallbladder consistent with calculi. No wall thickening or focal tenderness. Common bile duct: Diameter: 5 mm Liver: No focal lesion identified. Within normal limits in parenchymal echogenicity. Portal vein is patent on color Doppler imaging with normal direction of blood flow towards the liver. IMPRESSION: Cholelithiasis without evidence of cholecystitis. Electronically Signed   By: Monte Fantasia M.D.   On: 08/31/2019 05:49         Scheduled Meds:  LORazepam  0-4 mg Intravenous Q6H   Or   LORazepam  0-4 mg Oral Q6H   [START ON 09/02/2019] LORazepam  0-4 mg Intravenous Q12H   Or   [START ON 09/02/2019] LORazepam  0-4 mg Oral Q12H   pantoprazole (PROTONIX) IV  40 mg Intravenous Q12H   thiamine  100 mg Oral Daily   Or   thiamine  100 mg Intravenous Daily   Continuous Infusions:  lactated ringers Stopped (08/31/19 1147)     LOS: 0 days   Time spent= 35 mins    Larayne Baxley Arsenio Loader, MD [facility]  If 7PM-7AM, please contact night-coverage  08/31/2019, 12:12 PM

## 2019-08-31 NOTE — ED Notes (Signed)
Pt transported to CT ?

## 2019-08-31 NOTE — Progress Notes (Signed)
We were called to consult on this patient but she belongs to [facility], has [facility] and saw Dr. Paulita Fujita in the past.  I did call their office and they will be seeing the patient.  Ellouise Newer, PA-C [facility]

## 2019-08-31 NOTE — ED Notes (Signed)
Pt had urinated in bed and was attempting to get out of bed without assistance and has been having increasing confusion but able to be reoriented. External urinary catheter applied to pt. Awaiting room assignment at this time.

## 2019-08-31 NOTE — ED Notes (Signed)
Pt provided with an additional cup of water, pt was encourage to drink water slowly. Will continue to monitor. Pt has not had any additional episodes of emesis

## 2019-08-31 NOTE — ED Notes (Signed)
Patient still confused.  Got up alone, pulled all her wires off, and urinated in the floor.  Patient cleaned up and gotten back in bed.  Bed alarm placed under patient and PRN ativan dose given.  Patient now resting comfortably in bed.

## 2019-08-31 NOTE — ED Notes (Signed)
Patient on 2L O2 via [place] at start of shift, known when this was placed on patient.  When asked patient stated she did not use at home.  Attempted to wean to room air however when patient fell asleep her oxygen dropped to 85% on room air, 2L reapplied with improvement in O2 sat to 95%.

## 2019-08-31 NOTE — ED Notes (Signed)
Hospitalist at bedside at this time 

## 2019-08-31 NOTE — H&P (Signed)
History and Physical    EMERLY PRAK EYC:[id number] DOB: 1951-02-20 DOA: 08/30/2019  PCP: [name], MD  Patient coming from: Home   Chief Complaint:  Chief Complaint  Patient presents with  . Abdominal Pain  . Emesis  . Nausea     HPI:    69 year old female with past medical history of bipolar disorder, hyperlipidemia, gastroesophageal reflux disease, hypothyroidism, alcohol abuse who presents to [facility] emergency department with a 2-day history of abdominal pain nausea and vomiting.  Patient explains that she has been abstinent of alcohol use for nearly 30 years (although ED records in 04/2019 reveal that patient presented with a drinking binge in similar fashion at that time). Patient states that since the beginning of the pandemic, her AA meetings have been canceled and she has struggled with maintaining her abstinence.  Several days ago, the patient began binge drinking.  She states that she drank several bottles of wine but has been unable to quantify how much.  Then, 2 days ago the patient began to experience epigastric pain nausea and vomiting.  Patient describes this abdominal pain as severe in intensity, sharp in quality and nonradiating.  This is been associated with frequent bouts of nonbilious nonbloody vomiting.  Patient is also complaining of some episodes of loose stool but denies any melena or hematochezia.  Patient symptoms persisted over the following 2 days until the patient eventually presented to [facility] hospital for evaluation.  Upon evaluation in the emergency department, patient was provided with 1500 cc of normal saline, intravenous Zofran, intravenous metoclopramide and several doses of intravenous morphine.  CT imaging of the abdomen and pelvis was performed which revealed no evidence of acute disease.  Due to patient's intractable symptoms the hospitalist group was then called to assess patient for admission the hospital.     Review  of Systems: A 10-system review of systems has been performed and all systems are negative with the exception of what is listed in the HPI.    Past Medical History:  Diagnosis Date  . Anemia    low iron  . Anxiety   . Asthma   . Bipolar disorder ([place])   . Chronic back pain   . Compression fracture of L2 lumbar vertebra (HCC)   . DDD (degenerative disc disease)   . Depression   . Dyspnea   . GERD (gastroesophageal reflux disease)   . H/O alcohol abuse    28 years sober  . H/O suicide attempt   . Herpes   . History of hiatal hernia   . Hypothyroidism   . Migraines   . Osteoporosis 11/2018   T score of -2.6  . Pneumonia   . S/P adenoidectomy   . S/P tonsillectomy     Past Surgical History:  Procedure Laterality Date  . CESAREAN SECTION    . COLONOSCOPY     as a teenager  . Compression fracture spine    . DILATION AND CURETTAGE OF UTERUS    . EYE SURGERY Bilateral    CATARACTS REMOVED, NO LENS PLACED  . HYSTEROSCOPY    . KYPHOPLASTY N/A 12/19/2016   Procedure: THORACIC 12 KYPHOPLASTY; REQUEST 60 MINS AND FLIP ROOM;  Surgeon: Phylliss Bob, MD;  Location: [place];  Service: Orthopedics;  Laterality: N/A;  THORACIC 12 KYPHOPLASTY; REQUEST 60 MINS AND FLIP ROOM  . KYPHOPLASTY N/A 12/11/2017   Procedure: LUMBAR 2 KYPHOPLASTY;  Surgeon: Phylliss Bob, MD;  Location: [place];  Service: Orthopedics;  Laterality: N/A;  .  LUMBAR FUSION    . TONSILLECTOMY       reports that she has never smoked. She has never used smokeless tobacco. She reports current drug use. Drug: Benzodiazepines. She reports that she does not drink alcohol.  Allergies  Allergen Reactions  . Prednisone Other (See Comments)    Cannot tolerate ORALLY, but can tolerate by shot (interacts with anxiety & bipolarity)  . Lithium Carbonate Other (See Comments)    Patient was taken off of this because of a weight gain of 40 pounds  . Sulfa Antibiotics Other (See Comments)    From childhood; reaction not recalled     Family History  Adopted: Yes     Prior to Admission medications   Medication Sig Start Date End Date Taking? Authorizing Provider  acyclovir ointment (ZOVIRAX) 5 % Apply topically 3 (three) times daily. As needed. Patient not taking: Reported on 12/24/2018 05/01/17   [name], NP  albuterol (PROVENTIL HFA;VENTOLIN HFA) 108 (90 Base) MCG/ACT inhaler Inhale 2 puffs into the lungs every 6 (six) hours as needed for wheezing or shortness of breath.    [provider]  buPROPion (WELLBUTRIN XL) 150 MG 24 hr tablet Take 300 mg by mouth daily.  10/01/16   [provider]  calcium citrate-vitamin D (CALCIUM + D) 315-200 MG-UNIT per tablet Take 1 tablet by mouth daily. For bone health 10/18/14   Lindell Spar I, NP  chlordiazePOXIDE (LIBRIUM) 25 MG capsule 50mg  PO TID x 1D, then 25-50mg  PO BID X 1D, then 25-50mg  PO QD X 1D 05/08/19   Petrucelli, Samantha R, PA-C  cyclobenzaprine (FLEXERIL) 10 MG tablet Take 10 mg by mouth 3 (three) times daily as needed for muscle spasms.     [provider]  denosumab (PROLIA) 60 MG/ML SOLN injection Inject 60 mg into the skin every 6 (six) months. Administer in upper arm, thigh, or abdomen  NEXT IS DUE 03-2017 Patient not taking: Reported on 11/19/2018 03/05/17   [name], NP  EPINEPHrine 0.3 mg/0.3 mL IJ SOAJ injection Inject 0.3 mg into the skin as needed for anaphylaxis. 04/10/19   [provider]  FLUoxetine (PROZAC) 20 MG capsule Take 20 mg by mouth 2 (two) times daily.     [provider]  gabapentin (NEURONTIN) 600 MG tablet Take 600 mg by mouth 3 (three) times daily.  11/05/16   [provider]  levothyroxine (SYNTHROID, LEVOTHROID) 112 MCG tablet Take 112 mcg by mouth daily before breakfast.  09/04/17   [provider]  methocarbamol (ROBAXIN) 500 MG tablet Take 500 mg by mouth 2 (two) times daily. 03/11/19   [provider]  Multiple Vitamin (MULTIVITAMIN) tablet Take 1 tablet by  mouth daily. For low vitamin 10/18/14   Lindell Spar I, NP  naproxen sodium (ALEVE) 220 MG tablet Take 220-440 mg by mouth 2 (two) times daily as needed (for pain).    [provider]  omeprazole (PRILOSEC) 20 MG capsule Take 1 capsule (20 mg total) by mouth daily. 05/08/19   Petrucelli, Samantha R, PA-C  ondansetron (ZOFRAN ODT) 4 MG disintegrating tablet Take 1 tablet (4 mg total) by mouth every 8 (eight) hours as needed for nausea or vomiting. 05/08/19   Petrucelli, Samantha R, PA-C  ondansetron (ZOFRAN) 8 MG tablet Take 8 mg by mouth every 8 (eight) hours as needed for nausea or vomiting (Migraine Head Ache symptoms).    [provider]  oxcarbazepine (TRILEPTAL) 600 MG tablet Take 600 mg by mouth 2 (  two) times daily.     [provider]  OxyCODONE ER (XTAMPZA ER) 13.5 MG C12A Take 13.5 mg by mouth 2 (two) times daily. 11/22/16   [provider]  Oxycodone HCl 10 MG TABS Take 10 mg by mouth daily as needed (for breakthrough pain).  09/16/17   [provider]  QUEtiapine (SEROQUEL) 100 MG tablet Take 100-200 mg by mouth at bedtime.  10/12/16   [provider]  rizatriptan (MAXALT-MLT) 10 MG disintegrating tablet Take 10 mg by mouth as needed for migraine.  11/08/14   [provider]  sucralfate (CARAFATE) 1 GM/10ML suspension Take 10 mLs (1 g total) by mouth 4 (four) times daily -  with meals and at bedtime. 05/08/19   Petrucelli, Glynda Jaeger, PA-C  valACYclovir (VALTREX) 500 MG tablet Take one tablet twice daily 3-5 days then daily as needed. 05/01/17   [name], NP    Physical Exam: Vitals:   08/31/19 0229 08/31/19 0340 08/31/19 0359 08/31/19 0434  BP: (!) 155/102  (!) 164/96 (!) 177/89  Pulse: 86   81  Resp: 18  19 20   Temp:      SpO2: 99%   96%  Weight:  73 kg    Height:  5\' 4"  (1.626 m)       Constitutional: Lethargic but arousable and oriented x3, patient is in distress due to epigastric pain. Skin: no rashes, no lesions,  poor skin turgor noted. Eyes: Pupils are equally reactive to light.  No evidence of scleral icterus or conjunctival pallor.  ENMT: Dry mucous membranes noted.  Posterior pharynx clear of any exudate or lesions.   Neck: normal, supple, no masses, no thyromegaly.  No evidence of jugular venous distension.   Respiratory: clear to auscultation bilaterally, no wheezing, no crackles. Normal respiratory effort. No accessory muscle use.  Cardiovascular: Regular rate and rhythm, no murmurs / rubs / gallops. No extremity edema. 2+ pedal pulses. No carotid bruits.  Chest:   Nontender without crepitus or deformity.   Back:   Nontender without crepitus or deformity. Abdomen: Epigastric tenderness noted.  Abdomen is soft.  No evidence of intra-abdominal masses.  Positive bowel sounds noted in all quadrants.   Musculoskeletal: No joint deformity upper and lower extremities. Good ROM, no contractures. Normal muscle tone.  Neurologic: Patient is lethargic but arousable and exhibits occasional stuttering throughout the interview.  CN 2-12 grossly intact. Sensation intact, strength noted to be 5 out of 5 in all 4 extremities.  Patient is following all commands.  Patient is responsive to verbal stimuli.   Psychiatric: Patient presents with a depressed mood and flat affect.  Patient seems to possess insight as to theircurrent situation.     Labs on Admission: I have personally reviewed following labs and imaging studies -   CBC: Recent Labs  Lab 08/30/19 2215  WBC 9.7  NEUTROABS 8.9*  HGB 11.0*  HCT 38.0  MCV 75.5*  PLT 245   Basic Metabolic Panel: Recent Labs  Lab 08/30/19 2215  NA 140  K 4.3  CL 103  CO2 22  GLUCOSE 161*  BUN 9  CREATININE 0.56  CALCIUM 9.5   GFR: Estimated Creatinine Clearance: 64 mL/min (by C-G formula based on SCr of 0.56 mg/dL). Liver Function Tests: Recent Labs  Lab 08/30/19 2215  AST 19  ALT 14  ALKPHOS 86  BILITOT 0.6  PROT 8.1  ALBUMIN 4.4   Recent Labs   Lab 08/30/19 2215  LIPASE 23   No  results for input(s): AMMONIA in the last 168 hours. Coagulation Profile: No results for input(s): INR, PROTIME in the last 168 hours. Cardiac Enzymes: No results for input(s): CKTOTAL, CKMB, CKMBINDEX, TROPONINI in the last 168 hours. BNP (last 3 results) No results for input(s): PROBNP in the last 8760 hours. HbA1C: No results for input(s): HGBA1C in the last 72 hours. CBG: No results for input(s): GLUCAP in the last 168 hours. Lipid Profile: No results for input(s): CHOL, HDL, LDLCALC, TRIG, CHOLHDL, LDLDIRECT in the last 72 hours. Thyroid Function Tests: No results for input(s): TSH, T4TOTAL, FREET4, T3FREE, THYROIDAB in the last 72 hours. Anemia Panel: No results for input(s): VITAMINB12, FOLATE, FERRITIN, TIBC, IRON, RETICCTPCT in the last 72 hours. Urine analysis:    Component Value Date/Time   COLORURINE STRAW (A) 08/30/2019 2215   APPEARANCEUR HAZY (A) 08/30/2019 2215   LABSPEC 1.010 08/30/2019 2215   PHURINE 8.0 08/30/2019 2215   GLUCOSEU 50 (A) 08/30/2019 2215   HGBUR SMALL (A) 08/30/2019 2215   BILIRUBINUR NEGATIVE 08/30/2019 2215   KETONESUR 5 (A) 08/30/2019 2215   PROTEINUR 30 (A) 08/30/2019 2215   UROBILINOGEN 0.2 11/12/2013 2233   NITRITE NEGATIVE 08/30/2019 2215   LEUKOCYTESUR NEGATIVE 08/30/2019 2215    Radiological Exams on Admission - Personally Reviewed: CT ABDOMEN PELVIS W CONTRAST  Result Date: 08/31/2019 CLINICAL DATA:  Pancreatitis suspected EXAM: CT ABDOMEN AND PELVIS WITH CONTRAST TECHNIQUE: Multidetector CT imaging of the abdomen and pelvis was performed using the standard protocol following bolus administration of intravenous contrast. CONTRAST:  147mL OMNIPAQUE IOHEXOL 300 MG/ML  SOLN COMPARISON:  05/08/2019 FINDINGS: Lower chest: Large hiatal hernia.  No acute abnormality. Hepatobiliary: Diffuse low-density throughout the liver compatible with fatty infiltration. No focal abnormality. Gallbladder unremarkable.  Pancreas: No focal abnormality or ductal dilatation. No surrounding inflammation to suggest acute pancreatitis. Spleen: No focal abnormality.  Normal size. Adrenals/Urinary Tract: Stable small hypodensity in the upper pole of the left kidney compatible with small cyst. No hydronephrosis. Adrenal glands and urinary bladder unremarkable. Stomach/Bowel: Colonic diverticulosis. No active diverticulitis. Stomach and small bowel decompressed. Vascular/Lymphatic: Aortic atherosclerosis. No enlarged abdominal or pelvic lymph nodes. Reproductive: Uterus and adnexa unremarkable.  No mass. Other: No free fluid or free air. Musculoskeletal: No acute bony abnormality. Postoperative changes in the lumbar spine. IMPRESSION: Diffuse fatty infiltration of the liver. No evidence of acute or chronic pancreatitis by CT. Large hiatal hernia. Aortic atherosclerosis. Colonic diverticulosis. Electronically Signed   By: Rolm Baptise M.D.   On: 08/31/2019 00:46    EKG: Personally reviewed.  Rhythm is sinus arrhythmia with heart rate of 72 bpm.  No dynamic ST segment changes appreciated.  Assessment/Plan Principal Problem:   Intractable nausea and vomiting   Patient presenting with 2-day history of intractable nausea and vomiting  This is likely secondary to patient's recent alcohol binge with concurrent likely alcoholic gastritis.  Placing patient on as needed intravenous antiemetics  Placing patient on clear liquid diet  Scheduling intravenous PPI every 12  Patient symptoms are likely self-limiting  CT imaging of abdomen pelvis does not reveal any cause of the patient's symptoms  Urinalysis not suggestive of UTI  Right upper quadrant ultrasound has been ordered.  Monitoring for symptomatic improvement  Active Problems:   Epigastric pain   Epigastric pain likely secondary to acute alcoholic gastritis  No evidence of active bleeding however.  Patient been placed on Protonix 40 mg IV every 12  Clear  liquid diet for now.  As needed antiemetics and analgesics  Lipase unremarkable  Urinalysis not suggestive of UTI  CT imaging of abdomen pelvis does not reveal an etiology of patient's pain.  Symptoms should be self-limiting, will monitor patient for clinical improvement    Hypothyroidism   Continue home regimen of Synthroid    Bipolar disorder ([place])   Continue home regimen of psychotropic medications    GERD (gastroesophageal reflux disease)   Protonix 40 mg IV every 12 for now.    Alcohol abuse   Patient reports having been abstinent for 30 years and just recently having relapsed several days ago-unfortunately review of ED records from February reveal that patient actually presented with a similar drinking binge at that time  Will counsel patient daily on cessation  CIWA protocol to monitor for signs of withdrawal and provide patient with as needed benzodiazepines    Microcytic anemia  No evidence of clinical bleeding on examination  Iron panel, folate, vitamin B12 obtained  Monitor hemoglobin and hematocrit with serial CBCs    Code Status: Full code Family Communication: Deferred  Status is: Observation  The patient remains OBS appropriate and will d/c before 2 midnights.  Dispo: The patient is from: Home              Anticipated d/c is to: Home              Anticipated d/c date is: 2 days              Patient currently is not medically stable to d/c.        Vernelle Emerald MD [facility] Pager [phone]  If 7PM-7AM, please contact night-coverage [email or web address] Use universal [place] password for that web site. If you do not have the password, please call the hospital operator.  08/31/2019, 5:00 AM

## 2019-08-31 NOTE — ED Notes (Signed)
Pt provided with water for fluid challenge. Will continue to monitor

## 2019-08-31 NOTE — ED Notes (Signed)
At this time pt is having difficulty conversing as compared to earlier. Pt is unable to state name or date of birth. Dr. Cyd Silence was notified of changes.

## 2019-09-01 DIAGNOSIS — F10230 Alcohol dependence with withdrawal, uncomplicated: Secondary | ICD-10-CM | POA: Diagnosis not present

## 2019-09-01 DIAGNOSIS — R112 Nausea with vomiting, unspecified: Secondary | ICD-10-CM | POA: Diagnosis not present

## 2019-09-01 LAB — COMPREHENSIVE METABOLIC PANEL
ALT: 11 U/L (ref 0–44)
AST: 14 U/L — ABNORMAL LOW (ref 15–41)
Albumin: 3.4 g/dL — ABNORMAL LOW (ref 3.5–5.0)
Alkaline Phosphatase: 65 U/L (ref 38–126)
Anion gap: 10 (ref 5–15)
BUN: 9 mg/dL (ref 8–23)
CO2: 25 mmol/L (ref 22–32)
Calcium: 8.8 mg/dL — ABNORMAL LOW (ref 8.9–10.3)
Chloride: 99 mmol/L (ref 98–111)
Creatinine, Ser: 0.8 mg/dL (ref 0.44–1.00)
GFR calc Af Amer: >60 mL/min (ref >60)
GFR calc non Af Amer: >60 mL/min (ref >60)
Glucose, Bld: 116 mg/dL — ABNORMAL HIGH (ref 70–99)
Potassium: 3.3 mmol/L — ABNORMAL LOW (ref 3.5–5.1)
Sodium: 134 mmol/L — ABNORMAL LOW (ref 135–145)
Total Bilirubin: 0.8 mg/dL (ref 0.3–1.2)
Total Protein: 6.3 g/dL — ABNORMAL LOW (ref 6.5–8.1)

## 2019-09-01 LAB — CBC
HCT: 32.8 % — ABNORMAL LOW (ref 36.0–46.0)
Hemoglobin: 9.4 g/dL — ABNORMAL LOW (ref 12.0–15.0)
MCH: 21.6 pg — ABNORMAL LOW (ref 26.0–34.0)
MCHC: 28.7 g/dL — ABNORMAL LOW (ref 30.0–36.0)
MCV: 75.2 fL — ABNORMAL LOW (ref 80.0–100.0)
Platelets: 319 10*3/uL (ref 150–400)
RBC: 4.36 MIL/uL (ref 3.87–5.11)
RDW: 17.4 % — ABNORMAL HIGH (ref 11.5–15.5)
WBC: 8.5 10*3/uL (ref 4.0–10.5)
nRBC: 0 % (ref 0.0–0.2)

## 2019-09-01 LAB — GLUCOSE, CAPILLARY
Glucose-Capillary: 112 mg/dL — ABNORMAL HIGH (ref 70–99)
Glucose-Capillary: 145 mg/dL — ABNORMAL HIGH (ref 70–99)

## 2019-09-01 LAB — MAGNESIUM: Magnesium: 1.9 mg/dL (ref 1.7–2.4)

## 2019-09-01 MED ORDER — SENNOSIDES-DOCUSATE SODIUM 8.6-50 MG PO TABS
2.0000 | ORAL_TABLET | Freq: Two times a day (BID) | ORAL | Status: DC
  Administered 2019-09-02 – 2019-09-03 (×3): 2 via ORAL
  Filled 2019-09-01 (×4): qty 2

## 2019-09-01 MED ORDER — SUCRALFATE 1 GM/10ML PO SUSP
1.0000 g | Freq: Three times a day (TID) | ORAL | Status: DC
  Administered 2019-09-01 – 2019-09-04 (×11): 1 g via ORAL
  Filled 2019-09-01 (×11): qty 10

## 2019-09-01 MED ORDER — POLYETHYLENE GLYCOL 3350 17 G PO PACK: 17.0000 g | PACK | Freq: Every day | ORAL | Status: DC | PRN

## 2019-09-01 MED ORDER — QUETIAPINE FUMARATE 100 MG PO TABS
100.0000 mg | ORAL_TABLET | Freq: Every day | ORAL | Status: DC
  Administered 2019-09-01 – 2019-09-03 (×3): 100 mg via ORAL
  Filled 2019-09-01 (×3): qty 1

## 2019-09-01 MED ORDER — SODIUM CHLORIDE 0.9 % IV SOLN
510.0000 mg | Freq: Once | INTRAVENOUS | Status: AC
  Administered 2019-09-01: 510 mg via INTRAVENOUS
  Filled 2019-09-01: qty 510

## 2019-09-01 MED ORDER — FAMOTIDINE 20 MG PO TABS
20.0000 mg | ORAL_TABLET | Freq: Every day | ORAL | Status: DC
  Administered 2019-09-01 – 2019-09-03 (×3): 20 mg via ORAL
  Filled 2019-09-01 (×3): qty 1

## 2019-09-01 MED ORDER — FERROUS SULFATE 325 (65 FE) MG PO TABS
325.0000 mg | ORAL_TABLET | Freq: Two times a day (BID) | ORAL | Status: DC
  Administered 2019-09-02 – 2019-09-04 (×5): 325 mg via ORAL
  Filled 2019-09-01 (×6): qty 1

## 2019-09-01 NOTE — Progress Notes (Signed)
PROGRESS NOTE    Victoria Levine  GOT:[id number] DOB: [birth year] DOA: 08/30/2019 PCP: [name], MD   Brief Narrative:  69 year old with history of bipolar disorder, HLD, GERD, hypothyroidism, alcohol use initially presented to [facility], ER with complaints of abdominal pain and nausea.  Apparently patient has been a heavy drinker.  Routine lab work within normal limits, CT abdomen pelvis was negative for any acute pathology.   Assessment & Plan:   Principal Problem:   Intractable nausea and vomiting Active Problems:   Hypothyroidism   Bipolar disorder (HCC)   GERD (gastroesophageal reflux disease)   Epigastric pain   Alcohol abuse   Microcytic anemia  Intractable nausea and vomiting, improved, epigastric pain-suspect alcohol gastritis -Lipase normal, CT abdomen pelvis-fatty infiltrate otherwise no evidence of pancreatitis.  LFTs within normal limits, right upper quadrant ultrasound-cholelithiasis without evidence of cholecystitis.  UA-negative. -Continue PPI twice daily.  Pepcid, Carafate -Spoke with [facility], she will require endoscopic evaluation on nonemergent basis.  This will be arranged outpatient -Advance diet as tolerated  Microcytic anemia, iron deficiency -No clear evidence of melanotic stool.  IV iron today, p.o. with bowel regimen starting tomorrow  Altered Mental Status, non focal exam.  -CT of the head-negative, show small calcified hemangioma.  Her exam is mostly nonfocal.  We will continue neurochecks -Ammonia-normal.  Patient chooses not to speak with her sister morning and laying in the bed but appears overall comfortable -TSH within normal limits  Hypothyroidism -TSH within normal limits  Bipolar disease -Awaiting preadmission med rec to be completed  Poor lack of social support, consulted TOC team to help assist with safe disposition planning.  DVT prophylaxis: SCDs Code Status: Presumed full code Family Communication: Spoke with patient's  daughter Marliss Czar on [phone]  Status is: Observation  The patient remains OBS appropriate and will d/c before 2 midnights.  Dispo: The patient is from: Home              Anticipated d/c is to: To be determined              Anticipated d/c date is: 1 day              Patient currently is not medically stable to d/c.  Inconsistent oral intake.  Continue IV fluids in the meantime we will plan for safe disposition.  Subjective: Patient has sleeping this morning, refusing to wake up to speak with me.  When sternal rub she did wake up and answer basic questions  Review of Systems Otherwise negative except as per HPI, including: General: Denies fever, chills, night sweats or unintended weight loss. Resp: Denies cough, wheezing, shortness of breath. Cardiac: Denies chest pain, palpitations, orthopnea, paroxysmal nocturnal dyspnea. GI: Denies abdominal pain, nausea, vomiting, diarrhea or constipation GU: Denies dysuria, frequency, hesitancy or incontinence MS: Denies muscle aches, joint pain or swelling Neuro: Denies headache, neurologic deficits (focal weakness, numbness, tingling), abnormal gait Psych: Denies anxiety, depression, SI/HI/AVH Skin: Denies new rashes or lesions ID: Denies sick contacts, exotic exposures, travel  Examination: Constitutional: Not in acute distress Respiratory: Clear to auscultation bilaterally Cardiovascular: Normal sinus rhythm, no rubs Abdomen: Nontender nondistended good bowel sounds Musculoskeletal: No edema noted Skin: No rashes seen Neurologic: Difficult to assess Psychiatric: Difficult to assess  Objective: Vitals:   08/31/19 1221 08/31/19 1700 08/31/19 2150 09/01/19 0607  BP: (!) 164/83 (!) 158/82 (!) 167/109 100/76  Pulse: 81 88 92 88  Resp: 16 16  16   Temp: 98.7 F (37.1 C) 98.7  F (37.1 C) 99 F (37.2 C) 97.7 F (36.5 C)  TempSrc: Oral Oral Oral Oral  SpO2: 97% 98% 95% 93%  Weight: 72.2 kg     Height: 5\' 6"  (1.676 m)        Intake/Output Summary (Last 24 hours) at 09/01/2019 1124 Last data filed at 09/01/2019 6333 Gross per 24 hour  Intake 682.03 ml  Output 1200 ml  Net -517.97 ml   Filed Weights   08/31/19 0340 08/31/19 1221  Weight: 73 kg 72.2 kg     Data Reviewed:   CBC: Recent Labs  Lab 08/30/19 2215 08/31/19 1240 09/01/19 0448  WBC 9.7 12.0* 8.5  NEUTROABS 8.9* 10.2*  --   HGB 11.0* 10.4* 9.4*  HCT 38.0 36.3 32.8*  MCV 75.5* 75.0* 75.2*  PLT 247 417* 545   Basic Metabolic Panel: Recent Labs  Lab 08/30/19 2215 08/31/19 1240 09/01/19 0448  NA 140 137 134*  K 4.3 3.3* 3.3*  CL 103 98 99  CO2 22 27 25   GLUCOSE 161* 131* 116*  BUN 9 7* 9  CREATININE 0.56 0.62 0.80  CALCIUM 9.5 8.8* 8.8*  MG  --  1.9 1.9   GFR: Estimated Creatinine Clearance: 66.6 mL/min (by C-G formula based on SCr of 0.8 mg/dL). Liver Function Tests: Recent Labs  Lab 08/30/19 2215 08/31/19 1240 09/01/19 0448  AST 19 18 14*  ALT 14 13 11   ALKPHOS 86 86 65  BILITOT 0.6 0.8 0.8  PROT 8.1 8.0 6.3*  ALBUMIN 4.4 4.2 3.4*   Recent Labs  Lab 08/30/19 2215  LIPASE 23   Recent Labs  Lab 08/31/19 1240  AMMONIA 18   Coagulation Profile: No results for input(s): INR, PROTIME in the last 168 hours. Cardiac Enzymes: No results for input(s): CKTOTAL, CKMB, CKMBINDEX, TROPONINI in the last 168 hours. BNP (last 3 results) No results for input(s): PROBNP in the last 8760 hours. HbA1C: Recent Labs    08/30/19 2215  HGBA1C 5.9*   CBG: No results for input(s): GLUCAP in the last 168 hours. Lipid Profile: No results for input(s): CHOL, HDL, LDLCALC, TRIG, CHOLHDL, LDLDIRECT in the last 72 hours. Thyroid Function Tests: Recent Labs    08/31/19 1240  TSH 2.086   Anemia Panel: Recent Labs    08/31/19 1240  VITAMINB12 406  FOLATE 6.3  TIBC 582*  IRON 28   Sepsis Labs: No results for input(s): PROCALCITON, LATICACIDVEN in the last 168 hours.  Recent Results (from the past 240 hour(s))   SARS Coronavirus 2 by RT PCR (hospital order, performed in [facility] hospital lab) Nasopharyngeal Nasopharyngeal Swab     Status: None   Collection Time: 08/31/19  3:43 AM   Specimen: Nasopharyngeal Swab  Result Value Ref Range Status   SARS Coronavirus 2 NEGATIVE NEGATIVE Final    Comment: (NOTE) SARS-CoV-2 target nucleic acids are NOT DETECTED.  The SARS-CoV-2 RNA is generally detectable in upper and lower respiratory specimens during the acute phase of infection. The lowest concentration of SARS-CoV-2 viral copies this assay can detect is 250 copies / mL. A negative result does not preclude SARS-CoV-2 infection and should not be used as the sole basis for treatment or other patient management decisions.  A negative result may occur with improper specimen collection / handling, submission of specimen other than nasopharyngeal swab, presence of viral mutation(s) within the areas targeted by this assay, and inadequate number of viral copies (<250 copies / mL). A negative result must be combined with clinical observations,  patient history, and epidemiological information.  Fact Sheet for Patients:   StrictlyIdeas.no  Fact Sheet for Healthcare Providers: BankingDealers.co.za  This test is not yet approved or  cleared by the [place] FDA and has been authorized for detection and/or diagnosis of SARS-CoV-2 by FDA under an Emergency Use Authorization (EUA).  This EUA will remain in effect (meaning this test can be used) for the duration of the COVID-19 declaration under Section 564(b)(1) of the Act, 21 U.S.C. section 360bbb-3(b)(1), unless the authorization is terminated or revoked sooner.  Performed at [facility], [place] [address] [place], [place] [address]          Radiology Studies: CT HEAD WO CONTRAST  Result Date: 08/31/2019 CLINICAL DATA:  Altered mental status with unclear cause EXAM: CT HEAD  WITHOUT CONTRAST TECHNIQUE: Contiguous axial images were obtained from the base of the skull through the vertex without intravenous contrast. COMPARISON:  11/12/2013 FINDINGS: Brain: No evidence of acute infarction, hemorrhage, hydrocephalus, extra-axial collection or mass effect. 1.4 cm nodular calcification in the left para median high vertex, stable and most consistent with meningioma. Possible small remote right superior cerebellar infarction. Vascular: Atherosclerotic calcification Skull: Rounded ground-glass lesion in the paramedian left frontal bone that is benign and stable from 2015. Sinuses/Orbits: Bilateral cataract resection IMPRESSION: 1. No acute finding. 2. Incidental, and small calcified meningioma at the vertex. Electronically Signed   By: Monte Fantasia M.D.   On: 08/31/2019 06:35   CT ABDOMEN PELVIS W CONTRAST  Result Date: 08/31/2019 CLINICAL DATA:  Pancreatitis suspected EXAM: CT ABDOMEN AND PELVIS WITH CONTRAST TECHNIQUE: Multidetector CT imaging of the abdomen and pelvis was performed using the standard protocol following bolus administration of intravenous contrast. CONTRAST:  113mL OMNIPAQUE IOHEXOL 300 MG/ML  SOLN COMPARISON:  05/08/2019 FINDINGS: Lower chest: Large hiatal hernia.  No acute abnormality. Hepatobiliary: Diffuse low-density throughout the liver compatible with fatty infiltration. No focal abnormality. Gallbladder unremarkable. Pancreas: No focal abnormality or ductal dilatation. No surrounding inflammation to suggest acute pancreatitis. Spleen: No focal abnormality.  Normal size. Adrenals/Urinary Tract: Stable small hypodensity in the upper pole of the left kidney compatible with small cyst. No hydronephrosis. Adrenal glands and urinary bladder unremarkable. Stomach/Bowel: Colonic diverticulosis. No active diverticulitis. Stomach and small bowel decompressed. Vascular/Lymphatic: Aortic atherosclerosis. No enlarged abdominal or pelvic lymph nodes. Reproductive: Uterus and  adnexa unremarkable.  No mass. Other: No free fluid or free air. Musculoskeletal: No acute bony abnormality. Postoperative changes in the lumbar spine. IMPRESSION: Diffuse fatty infiltration of the liver. No evidence of acute or chronic pancreatitis by CT. Large hiatal hernia. Aortic atherosclerosis. Colonic diverticulosis. Electronically Signed   By: Rolm Baptise M.D.   On: 08/31/2019 00:46   US Abdomen Limited RUQ  Result Date: 08/31/2019 CLINICAL DATA:  Abdominal pain and altered mental status EXAM: ULTRASOUND ABDOMEN LIMITED RIGHT UPPER QUADRANT COMPARISON:  Abdominal CT from earlier today FINDINGS: Gallbladder: Nonshadowing but discrete echogenic ovoid structures in the gallbladder consistent with calculi. No wall thickening or focal tenderness. Common bile duct: Diameter: 5 mm Liver: No focal lesion identified. Within normal limits in parenchymal echogenicity. Portal vein is patent on color Doppler imaging with normal direction of blood flow towards the liver. IMPRESSION: Cholelithiasis without evidence of cholecystitis. Electronically Signed   By: Monte Fantasia M.D.   On: 08/31/2019 05:49        Scheduled Meds: . buPROPion  300 mg Oral Daily  . famotidine  20 mg Oral QHS  . [START ON 09/02/2019] ferrous sulfate  325 mg Oral BID WC  . FLUoxetine  20 mg Oral BID  . gabapentin  600 mg Oral TID  . levothyroxine  112 mcg Oral Q0600  . LORazepam  0-4 mg Intravenous Q6H   Or  . LORazepam  0-4 mg Oral Q6H  . [START ON 09/02/2019] LORazepam  0-4 mg Intravenous Q12H   Or  . [START ON 09/02/2019] LORazepam  0-4 mg Oral Q12H  . methocarbamol  500 mg Oral BID  . oxcarbazepine  600 mg Oral BID  . pantoprazole (PROTONIX) IV  40 mg Intravenous Q12H  . QUEtiapine  100-200 mg Oral QHS  . [START ON 09/02/2019] senna-docusate  2 tablet Oral BID  . sucralfate  1 g Oral TID WC & HS  . thiamine  100 mg Oral Daily   Or  . thiamine  100 mg Intravenous Daily   Continuous Infusions: . ferumoxytol        LOS: 0 days   Time spent= 35 mins    Camara Renstrom Arsenio Loader, MD [facility]  If 7PM-7AM, please contact night-coverage  09/01/2019, 11:24 AM

## 2019-09-01 NOTE — Progress Notes (Signed)
Patient is difficult to arouse but responds to voice. Informed by night RN that she has been sleepy since ativan was administered.   Pt is very drowsy and therefor not responding to taking medication.   MD notified. Will attempt to administer medications and encourage intake when patient is more awake and alert.

## 2019-09-01 NOTE — TOC Progression Note (Signed)
Transition of Care [facility]) - Progression Note    Patient Details  Name: Victoria Levine MRN: [id number] Date of Birth: 02/18/1951  Transition of Care [facility]) CM/SW Contact  Purcell Mouton, RN Phone Number: 09/01/2019, 11:19 AM  Clinical Narrative:    Spoke with pt's daughter Marliss Czar and her brother Jenny Reichmann concerning pt's admission as observation. Both understand and are concerned about pt. Daughter states pt lives alone with a history of ETOH abuse, pt was in [facility] for awhile and her uncle checks on pt because he lives here. Daughter lives in [place]. TOC will continue to follow.    Expected Discharge Plan: Home/Self Care Barriers to Discharge: No Barriers Identified  Expected Discharge Plan and Services Expected Discharge Plan: Home/Self Care       Living arrangements for the past 2 months: Single Family Home                                       Social Determinants of Health (SDOH) Interventions    Readmission Risk Interventions No flowsheet data found.

## 2019-09-01 NOTE — Care Management Obs Status (Signed)
[place] NOTIFICATION   Patient Details  Name: Victoria Levine MRN: [id number] Date of Birth: May 18, 1950   Medicare Observation Status Notification Given:  Yes    Purcell Mouton, RN 09/01/2019, 11:27 AM

## 2019-09-02 DIAGNOSIS — R112 Nausea with vomiting, unspecified: Secondary | ICD-10-CM | POA: Diagnosis not present

## 2019-09-02 LAB — COMPREHENSIVE METABOLIC PANEL
ALT: 14 U/L (ref 0–44)
AST: 23 U/L (ref 15–41)
Albumin: 3.8 g/dL (ref 3.5–5.0)
Alkaline Phosphatase: 74 U/L (ref 38–126)
Anion gap: 11 (ref 5–15)
BUN: 15 mg/dL (ref 8–23)
CO2: 25 mmol/L (ref 22–32)
Calcium: 9.6 mg/dL (ref 8.9–10.3)
Chloride: 96 mmol/L — ABNORMAL LOW (ref 98–111)
Creatinine, Ser: 0.97 mg/dL (ref 0.44–1.00)
GFR calc Af Amer: >60 mL/min (ref >60)
GFR calc non Af Amer: 59 mL/min — ABNORMAL LOW (ref >60)
Glucose, Bld: 134 mg/dL — ABNORMAL HIGH (ref 70–99)
Potassium: 2.9 mmol/L — ABNORMAL LOW (ref 3.5–5.1)
Sodium: 132 mmol/L — ABNORMAL LOW (ref 135–145)
Total Bilirubin: 0.7 mg/dL (ref 0.3–1.2)
Total Protein: 6.9 g/dL (ref 6.5–8.1)

## 2019-09-02 LAB — CBC
HCT: 36.3 % (ref 36.0–46.0)
Hemoglobin: 10.3 g/dL — ABNORMAL LOW (ref 12.0–15.0)
MCH: 21.8 pg — ABNORMAL LOW (ref 26.0–34.0)
MCHC: 28.4 g/dL — ABNORMAL LOW (ref 30.0–36.0)
MCV: 76.7 fL — ABNORMAL LOW (ref 80.0–100.0)
Platelets: 283 10*3/uL (ref 150–400)
RBC: 4.73 MIL/uL (ref 3.87–5.11)
RDW: 17.6 % — ABNORMAL HIGH (ref 11.5–15.5)
WBC: 8.3 10*3/uL (ref 4.0–10.5)
nRBC: 0 % (ref 0.0–0.2)

## 2019-09-02 LAB — GLUCOSE, CAPILLARY
Glucose-Capillary: 110 mg/dL — ABNORMAL HIGH (ref 70–99)
Glucose-Capillary: 127 mg/dL — ABNORMAL HIGH (ref 70–99)
Glucose-Capillary: 136 mg/dL — ABNORMAL HIGH (ref 70–99)
Glucose-Capillary: 147 mg/dL — ABNORMAL HIGH (ref 70–99)
Glucose-Capillary: 151 mg/dL — ABNORMAL HIGH (ref 70–99)

## 2019-09-02 LAB — MAGNESIUM: Magnesium: 2.2 mg/dL (ref 1.7–2.4)

## 2019-09-02 MED ORDER — ONDANSETRON HCL 4 MG/2ML IJ SOLN
4.0000 mg | INTRAMUSCULAR | Status: DC | PRN
  Administered 2019-09-03 (×2): 4 mg via INTRAVENOUS
  Filled 2019-09-02 (×2): qty 2

## 2019-09-02 MED ORDER — POTASSIUM CHLORIDE CRYS ER 20 MEQ PO TBCR
30.0000 meq | EXTENDED_RELEASE_TABLET | Freq: Once | ORAL | Status: AC
  Administered 2019-09-02: 30 meq via ORAL
  Filled 2019-09-02: qty 1

## 2019-09-02 MED ORDER — POTASSIUM CHLORIDE CRYS ER 20 MEQ PO TBCR
40.0000 meq | EXTENDED_RELEASE_TABLET | Freq: Once | ORAL | Status: AC
  Administered 2019-09-02: 40 meq via ORAL
  Filled 2019-09-02: qty 2

## 2019-09-02 MED ORDER — OXYCODONE HCL 5 MG PO TABS
5.0000 mg | ORAL_TABLET | ORAL | Status: DC | PRN
  Administered 2019-09-02: 5 mg via ORAL
  Administered 2019-09-03: 10 mg via ORAL
  Administered 2019-09-03 – 2019-09-04 (×4): 5 mg via ORAL
  Filled 2019-09-02 (×4): qty 1
  Filled 2019-09-02: qty 2
  Filled 2019-09-02: qty 1

## 2019-09-02 MED ORDER — POTASSIUM CHLORIDE 10 MEQ/100ML IV SOLN
10.0000 meq | INTRAVENOUS | Status: AC
  Administered 2019-09-02 (×3): 10 meq via INTRAVENOUS
  Filled 2019-09-02 (×3): qty 100

## 2019-09-02 MED ORDER — PANTOPRAZOLE SODIUM 40 MG PO TBEC
40.0000 mg | DELAYED_RELEASE_TABLET | Freq: Two times a day (BID) | ORAL | Status: DC
  Administered 2019-09-02 – 2019-09-04 (×5): 40 mg via ORAL
  Filled 2019-09-02 (×5): qty 1

## 2019-09-02 NOTE — Progress Notes (Addendum)
Assumed care of pt @ approximately 0200, discovered last output was 662mL @ 0600 6/15.  Bladder scanned pt at 0312 showed 261mL, pt with no urge to urinate, on BSC 30+ min water running ect.  Requested orders for in and out cath  Orders obtained for I/O cath if no urine output for 8 hrs or bladder scan greater than 368mL

## 2019-09-02 NOTE — Plan of Care (Signed)
  Problem: Activity: Goal: Risk for activity intolerance will decrease Outcome: Progressing   Problem: Nutrition: Goal: Adequate nutrition will be maintained Outcome: Progressing   Problem: Pain Managment: Goal: General experience of comfort will improve Outcome: Progressing   Problem: Safety: Goal: Ability to remain free from injury will improve Outcome: Progressing   

## 2019-09-02 NOTE — Progress Notes (Signed)
PROGRESS NOTE    Victoria Levine  NFA:[id number] DOB: [birth year] DOA: 08/30/2019 PCP: [name], MD   Brief Narrative:  69 year old with history of bipolar disorder, HLD, GERD, hypothyroidism, alcohol use initially presented to [facility], ER with complaints of abdominal pain and nausea.  Apparently patient has been a heavy drinker.  Routine lab work within normal limits, CT abdomen pelvis was negative for any acute pathology.   Assessment & Plan:   Principal Problem:   Intractable nausea and vomiting Active Problems:   Hypothyroidism   Bipolar disorder (HCC)   GERD (gastroesophageal reflux disease)   Epigastric pain   Alcohol abuse   Microcytic anemia  Intractable nausea and vomiting, improved, epigastric pain-suspect alcohol gastritis -Lipase normal, CT abdomen pelvis-fatty infiltrate otherwise no evidence of pancreatitis.  LFTs within normal limits, right upper quadrant ultrasound-cholelithiasis without evidence of cholecystitis.  UA-negative. -Continue PPI twice daily.  Pepcid, Carafate -Spoke with [facility], she will require endoscopic evaluation on nonemergent basis.  This will be arranged outpatient -Tolerating soft liquid diet.  Unsteady gait and generalized weakness -PT/OT  Microcytic anemia, iron deficiency -Status post IV iron, now on p.o. regimen with stool softeners. -Will require outpatient EGD/colonoscopy with [facility] gastroenterology.  Altered Mental Status, non focal exam.   -CT of the head-negative, show small calcified hemangioma.  Her exam is mostly nonfocal.  We will continue neurochecks -Ammonia-normal.  Patient chooses not to speak with her sister morning and laying in the bed but appears overall comfortable -TSH within normal limits Consult Psych.   Hypothyroidism -TSH within normal limits  Bipolar disease -Awaiting preadmission med rec to be completed Consult psych.  I am concerned about her safety.   Poor lack of social support,  consulted TOC team to help assist with safe disposition planning.  DVT prophylaxis: SCDs Code Status: Presumed full code Family Communication: Spoke with patient's daughter Victoria Levine on [phone] again on 6/16  Status is: Observation  The patient remains OBS appropriate and will d/c before 2 midnights.  Dispo: The patient is from: Home              Anticipated d/c is to: To be determined              Anticipated d/c date is: 1 day              Patient currently is not medically stable to d/c.  Mental status is still inconsistent.   Subjective: Patient is awake alert this morning telling me that she wants to leave the hospital and she can call a cab or drive herself home.  I explained her that she is unsteady and her thoughts are more clear.  She does not remember much from yesterday.  I extensively spoke with the patient's daughter, there is concerns of her bizarre behavior and alternating thoughts especially while being on psychiatric medication.  Previously she was seen by psychiatry and transferred to behavioral [place] Otherwise negative except as per HPI, including: General: Denies fever, chills, night sweats or unintended weight loss. Resp: Denies cough, wheezing, shortness of breath. Cardiac: Denies chest pain, palpitations, orthopnea, paroxysmal nocturnal dyspnea. GI: Denies abdominal pain, nausea, vomiting, diarrhea or constipation GU: Denies dysuria, frequency, hesitancy or incontinence MS: Denies muscle aches, joint pain or swelling Neuro: Denies headache, neurologic deficits (focal weakness, numbness, tingling), abnormal gait Psych: Denies anxiety, depression, SI/HI/AVH Skin: Denies new rashes or lesions ID: Denies sick contacts, exotic exposures, travel  Examination: Constitutional: Not in acute  distress Respiratory: Clear to auscultation bilaterally Cardiovascular: Normal sinus rhythm, no rubs Abdomen: Nontender nondistended good bowel  sounds Musculoskeletal: No edema noted Skin: No rashes seen Neurologic: CN 2-12 grossly intact.  And nonfocal Psychiatric: Alert to name place, poor judgment and insight. Objective: Vitals:   09/01/19 1257 09/01/19 1400 09/01/19 2058 09/02/19 0553  BP: 114/77  104/78 (!) 141/83  Pulse: 80 84 82 66  Resp: 16   14  Temp: 98.4 F (36.9 C)  97.6 F (36.4 C) 97.7 F (36.5 C)  TempSrc:   Oral Oral  SpO2: 91%  96% 97%  Weight:      Height:        Intake/Output Summary (Last 24 hours) at 09/02/2019 1118 Last data filed at 09/02/2019 0609 Gross per 24 hour  Intake 540 ml  Output 700 ml  Net -160 ml   Filed Weights   08/31/19 0340 08/31/19 1221  Weight: 73 kg 72.2 kg     Data Reviewed:   CBC: Recent Labs  Lab 08/30/19 2215 08/31/19 1240 09/01/19 0448 09/02/19 0459  WBC 9.7 12.0* 8.5 8.3  NEUTROABS 8.9* 10.2*  --   --   HGB 11.0* 10.4* 9.4* 10.3*  HCT 38.0 36.3 32.8* 36.3  MCV 75.5* 75.0* 75.2* 76.7*  PLT 247 417* 319 086   Basic Metabolic Panel: Recent Labs  Lab 08/30/19 2215 08/31/19 1240 09/01/19 0448 09/02/19 0459  NA 140 137 134* 132*  K 4.3 3.3* 3.3* 2.9*  CL 103 98 99 96*  CO2 22 27 25 25   GLUCOSE 161* 131* 116* 134*  BUN 9 7* 9 15  CREATININE 0.56 0.62 0.80 0.97  CALCIUM 9.5 8.8* 8.8* 9.6  MG  --  1.9 1.9 2.2   GFR: Estimated Creatinine Clearance: 55 mL/min (by C-G formula based on SCr of 0.97 mg/dL). Liver Function Tests: Recent Labs  Lab 08/30/19 2215 08/31/19 1240 09/01/19 0448 09/02/19 0459  AST 19 18 14* 23  ALT 14 13 11 14   ALKPHOS 86 86 65 74  BILITOT 0.6 0.8 0.8 0.7  PROT 8.1 8.0 6.3* 6.9  ALBUMIN 4.4 4.2 3.4* 3.8   Recent Labs  Lab 08/30/19 2215  LIPASE 23   Recent Labs  Lab 08/31/19 1240  AMMONIA 18   Coagulation Profile: No results for input(s): INR, PROTIME in the last 168 hours. Cardiac Enzymes: No results for input(s): CKTOTAL, CKMB, CKMBINDEX, TROPONINI in the last 168 hours. BNP (last 3 results) No results  for input(s): PROBNP in the last 8760 hours. HbA1C: Recent Labs    08/30/19 2215  HGBA1C 5.9*   CBG: Recent Labs  Lab 09/01/19 1210 09/01/19 1738 09/02/19 0015 09/02/19 0551  GLUCAP 112* 145* 127* 110*   Lipid Profile: No results for input(s): CHOL, HDL, LDLCALC, TRIG, CHOLHDL, LDLDIRECT in the last 72 hours. Thyroid Function Tests: Recent Labs    08/31/19 1240  TSH 2.086   Anemia Panel: Recent Labs    08/31/19 1240  VITAMINB12 406  FOLATE 6.3  TIBC 582*  IRON 28   Sepsis Labs: No results for input(s): PROCALCITON, LATICACIDVEN in the last 168 hours.  Recent Results (from the past 240 hour(s))  SARS Coronavirus 2 by RT PCR (hospital order, performed in [facility] hospital lab) Nasopharyngeal Nasopharyngeal Swab     Status: None   Collection Time: 08/31/19  3:43 AM   Specimen: Nasopharyngeal Swab  Result Value Ref Range Status   SARS Coronavirus 2 NEGATIVE NEGATIVE Final    Comment: (NOTE) SARS-CoV-2 target  nucleic acids are NOT DETECTED.  The SARS-CoV-2 RNA is generally detectable in upper and lower respiratory specimens during the acute phase of infection. The lowest concentration of SARS-CoV-2 viral copies this assay can detect is 250 copies / mL. A negative result does not preclude SARS-CoV-2 infection and should not be used as the sole basis for treatment or other patient management decisions.  A negative result may occur with improper specimen collection / handling, submission of specimen other than nasopharyngeal swab, presence of viral mutation(s) within the areas targeted by this assay, and inadequate number of viral copies (<250 copies / mL). A negative result must be combined with clinical observations, patient history, and epidemiological information.  Fact Sheet for Patients:   StrictlyIdeas.no  Fact Sheet for Healthcare Providers: BankingDealers.co.za  This test is not yet approved or  cleared  by the [place] FDA and has been authorized for detection and/or diagnosis of SARS-CoV-2 by FDA under an Emergency Use Authorization (EUA).  This EUA will remain in effect (meaning this test can be used) for the duration of the COVID-19 declaration under Section 564(b)(1) of the Act, 21 U.S.C. section 360bbb-3(b)(1), unless the authorization is terminated or revoked sooner.  Performed at [facility], [place] [address], [place], [place] [address]          Radiology Studies: No results found.      Scheduled Meds: . buPROPion  300 mg Oral Daily  . famotidine  20 mg Oral QHS  . ferrous sulfate  325 mg Oral BID WC  . FLUoxetine  20 mg Oral BID  . gabapentin  600 mg Oral TID  . levothyroxine  112 mcg Oral Q0600  . LORazepam  0-4 mg Intravenous Q12H   Or  . LORazepam  0-4 mg Oral Q12H  . methocarbamol  500 mg Oral BID  . oxcarbazepine  600 mg Oral BID  . pantoprazole  40 mg Oral BID AC  . QUEtiapine  100 mg Oral QHS  . senna-docusate  2 tablet Oral BID  . sucralfate  1 g Oral TID WC & HS  . thiamine  100 mg Oral Daily   Or  . thiamine  100 mg Intravenous Daily   Continuous Infusions: . potassium chloride 10 mEq (09/02/19 0925)     LOS: 0 days   Time spent= 35 mins    Akbar Sacra Arsenio Loader, MD [facility]  If 7PM-7AM, please contact night-coverage  09/02/2019, 11:18 AM

## 2019-09-02 NOTE — Evaluation (Signed)
Physical Therapy Evaluation Patient Details Name: Victoria Levine MRN: [id number] DOB: [birth year] Today's Date: 09/02/2019   History of Present Illness  69 year old with history of bipolar disorder, EtOH, HLD, GERD, hypothyroidism, alcohol use initially presented to [facility], ER with complaints of abdominal pain and nausea.  Clinical Impression  Pt admitted with above diagnosis. Pt ambulated 43' with RW and mod assist for frequent loss of balance. Pt has decreased insight into her deficits. She lives alone. 24* assist recommended.  Pt currently with functional limitations due to the deficits listed below (see PT Problem List). Pt will benefit from skilled PT to increase their independence and safety with mobility to allow discharge to the venue listed below.       Follow Up Recommendations Supervision for mobility/OOB;Supervision/Assistance - 24 hour;SNF    Equipment Recommendations  None recommended by PT (TBD at next venue)    Recommendations for Other Services       Precautions / Restrictions Precautions Precautions: Fall Precaution Comments: pt reports frequent falls when taking the trash out Restrictions Weight Bearing Restrictions: No      Mobility  Bed Mobility Overal bed mobility: Modified Independent             General bed mobility comments: no assist for sit to supine using bedrail; pt on BSC at start of PT session  Transfers Overall transfer level: Needs assistance Equipment used: Rolling walker (2 wheeled) Transfers: Sit to/from Stand Sit to Stand: Min assist;Mod assist         General transfer comment: min A to rise, min to mod to steady as pt had multi-directional loss of balance  Ambulation/Gait Ambulation/Gait assistance: Min assist;Mod assist Gait Distance (Feet): 75 Feet Assistive device: Rolling walker (2 wheeled) Gait Pattern/deviations: Step-through pattern;Trunk flexed Gait velocity: WFL   General Gait Details: VCs for positioning  in RW, loss of balance x 3 requiring min to mod assist, decreased awareness of safety issues  Stairs            Wheelchair Mobility    Modified Rankin (Stroke Patients Only)       Balance Overall balance assessment: Needs assistance Sitting-balance support: Feet supported;No upper extremity supported Sitting balance-Leahy Scale: Fair     Standing balance support: Bilateral upper extremity supported Standing balance-Leahy Scale: Poor Standing balance comment: relies on BUE support in standing; LOB x 3 while ambulating 9' with RW                             Pertinent Vitals/Pain Pain Assessment: No/denies pain    Home Living Family/patient expects to be discharged to:: Private residence Living Arrangements: Alone     Home Access: Level entry     Home Layout: One level Home Equipment: Walker - 4 wheels;Grab bars - tub/shower Additional Comments: pt stated she has neighbors that can assist her at home    Prior Function Level of Independence: Independent with assistive device(s)         Comments: pt stated she doesn't use an assistive device, then stated she uses a rollator     Hand Dominance        Extremity/Trunk Assessment   Upper Extremity Assessment Upper Extremity Assessment: Overall WFL for tasks assessed    Lower Extremity Assessment Lower Extremity Assessment: Overall WFL for tasks assessed    Cervical / Trunk Assessment Cervical / Trunk Assessment: Normal  Communication   Communication: No difficulties  Cognition Arousal/Alertness: Awake/alert Behavior  During Therapy: WFL for tasks assessed/performed Overall Cognitive Status: No family/caregiver present to determine baseline cognitive functioning                                 General Comments: decreased safety awareness, decreased awareness of deficits      General Comments      Exercises     Assessment/Plan    PT Assessment Patient needs continued  PT services  PT Problem List Decreased balance;Decreased mobility;Decreased knowledge of use of DME;Decreased safety awareness       PT Treatment Interventions Gait training;Functional mobility training;Balance training;Therapeutic activities;Patient/family education    PT Goals (Current goals can be found in the Care Plan section)  Acute Rehab PT Goals Patient Stated Goal: to go home PT Goal Formulation: Patient unable to participate in goal setting Time For Goal Achievement: 09/16/19 Potential to Achieve Goals: Fair    Frequency Min 3X/week   Barriers to discharge        Co-evaluation               AM-PAC PT "6 Clicks" Mobility  Outcome Measure Help needed turning from your back to your side while in a flat bed without using bedrails?: A Little Help needed moving from lying on your back to sitting on the side of a flat bed without using bedrails?: A Little Help needed moving to and from a bed to a chair (including a wheelchair)?: A Lot Help needed standing up from a chair using your arms (e.g., wheelchair or bedside chair)?: A Lot Help needed to walk in hospital room?: A Lot Help needed climbing 3-5 steps with a railing? : A Lot 6 Click Score: 14    End of Session Equipment Utilized During Treatment: Gait belt Activity Tolerance: Patient tolerated treatment well Patient left: in bed;with call bell/phone within reach;with bed alarm set;with nursing/sitter in room Nurse Communication: Mobility status PT Visit Diagnosis: Unsteadiness on feet (R26.81);Difficulty in walking, not elsewhere classified (R26.2);History of falling (Z91.81);Repeated falls (R29.6)    Time: 1216-1229 PT Time Calculation (min) (ACUTE ONLY): 13 min   Charges:   PT Evaluation $PT Eval Moderate Complexity: 1 Mod         Philomena Doheny PT 09/02/2019  Acute Rehabilitation Services Pager [phone] Office [phone]

## 2019-09-02 NOTE — Consult Note (Signed)
[facility] Consult   Reason for Consult:  "bizarre behavior, inconsistent thought. Etoh abuse" Referring Physician:  Dr. Reesa Chew Patient Identification: Victoria Levine MRN:  [id number] Principal Diagnosis: Intractable nausea and vomiting Diagnosis:  Principal Problem:   Intractable nausea and vomiting Active Problems:   Hypothyroidism   Bipolar disorder ([place])   GERD (gastroesophageal reflux disease)   Epigastric pain   Alcohol abuse   Microcytic anemia   Total Time spent with patient: 30 minutes  Subjective: "I had the first glass of wine that I have had in thirty years over the weekend." Patient reports "I guess the wine and the spaghetti sauce together upset my stomach and I could not stop vomiting."   HPI:   Victoria Levine is a 69 y.o. female patient.  Patient denies suicidal ideations, denies history of self-harm. Patient denies homicidal ideations. Patient denies auditory and visual hallucinations. Patient denies symptoms of paranoia.  Patient reports living alone in [place]. Patient denies access to weapons. Patient is retired. Patient reports brother is supportive and lives near [place], [place].  Patient reports history of bipolar disorder. Patient followed by outpatient psychiatry. Patient reports current medications include Trileptal 600mg  BID, Wellbutrin XL 300 mg daily, Prozac 20mg  BID and Seroquel 100mg  QHS. Patient reports compliance with home medications. Patient reports difficulty with extended standing. Patient states "I don't stand up to cook so I don't always eat properly."  Patient reports substance use. Patient reports rare alcohol use. Patient reports rare marijuana use. Patient reports history of intermittent use of xanax that was not prescribed for her.  Patient reports "I am a faithful AA attendee," patient reports plan to follow up with AA meetings on "friendly and on [place]."  Patient reports plan to "get a new sponsor for AA."  Patient  reports "I attend morning and lunch time meetings because I have poor night vision now." Patient goes on to report "I have a "buddy" that has been bringing the alcohol, the xanax and the weed over for the last three or four months." Patient reports plan to tell this "buddy" to "go back to his life." Patient reports "Vonna Drafts" is "Jeani Hawking who I have dated since I was 9." Patient gives verbal consent to speak with brother but is unable to locate cell phone to provide number, RN attempted to locate phone also.   Past Psychiatric History: alcohol use disorder, Bipolar Disorder  Risk to Self:   Denies Risk to Others:   Denies Prior Inpatient Therapy:   2019 Geriatric psychiatric admission,  [facility] approx 30 years ago Prior Outpatient Therapy:   [name]- psychologist  Past Medical History:  Past Medical History:  Diagnosis Date  . Anemia    low iron  . Anxiety   . Asthma   . Bipolar disorder ([place])   . Chronic back pain   . Compression fracture of L2 lumbar vertebra (HCC)   . DDD (degenerative disc disease)   . Depression   . Dyspnea   . GERD (gastroesophageal reflux disease)   . H/O alcohol abuse    28 years sober  . H/O suicide attempt   . Herpes   . History of hiatal hernia   . Hypothyroidism   . Migraines   . Osteoporosis 11/2018   T score of -2.6  . Pneumonia   . S/P adenoidectomy   . S/P tonsillectomy     Past Surgical History:  Procedure Laterality Date  . CESAREAN SECTION    . COLONOSCOPY  as a teenager  . Compression fracture spine    . DILATION AND CURETTAGE OF UTERUS    . EYE SURGERY Bilateral    CATARACTS REMOVED, NO LENS PLACED  . HYSTEROSCOPY    . KYPHOPLASTY N/A 12/19/2016   Procedure: THORACIC 12 KYPHOPLASTY; REQUEST 60 MINS AND FLIP ROOM;  Surgeon: Phylliss Bob, MD;  Location: [facility];  Service: Orthopedics;  Laterality: N/A;  THORACIC 12 KYPHOPLASTY; REQUEST 60 MINS AND FLIP ROOM  . KYPHOPLASTY N/A 12/11/2017   Procedure: LUMBAR 2 KYPHOPLASTY;   Surgeon: Phylliss Bob, MD;  Location: [facility];  Service: Orthopedics;  Laterality: N/A;  . LUMBAR FUSION    . TONSILLECTOMY     Family History:  Family History  Adopted: Yes   Family Psychiatric  History: Biological Mother- alcohol use disorder Social History:  Social History   Substance and Sexual Activity  Alcohol Use No  . Alcohol/week: 0.0 standard drinks   Comment: hx of heavy alcohol use - 28 years sober     Social History   Substance and Sexual Activity  Drug Use Yes  . Types: Benzodiazepines   Comment: hx of cocaine use in her 51's    Social History   Socioeconomic History  . Marital status: Single    Spouse name: Not on file  . Number of children: Not on file  . Years of education: Not on file  . Highest education level: Not on file  Occupational History  . Not on file  Tobacco Use  . Smoking status: Never Smoker  . Smokeless tobacco: Never Used  Vaping Use  . Vaping Use: Never used  Substance and Sexual Activity  . Alcohol use: No    Alcohol/week: 0.0 standard drinks    Comment: hx of heavy alcohol use - 28 years sober  . Drug use: Yes    Types: Benzodiazepines    Comment: hx of cocaine use in her 20's  . Sexual activity: Not Currently    Birth control/protection: Post-menopausal    Comment: intercourse age 77, sexual partners less than 5  Other Topics Concern  . Not on file  Social History Narrative  . Not on file   Social Determinants of Health   Financial Resource Strain:   . Difficulty of Paying Living Expenses:   Food Insecurity:   . Worried About Charity fundraiser in the Last Year:   . Arboriculturist in the Last Year:   Transportation Needs:   . Film/video editor (Medical):   Marland Kitchen Lack of Transportation (Non-Medical):   Physical Activity:   . Days of Exercise per Week:   . Minutes of Exercise per Session:   Stress:   . Feeling of Stress :   Social Connections:   . Frequency of Communication with Friends and Family:   .  Frequency of Social Gatherings with Friends and Family:   . Attends Religious Services:   . Active Member of Clubs or Organizations:   . Attends Archivist Meetings:   Marland Kitchen Marital Status:    Additional Social History:    Allergies:   Allergies  Allergen Reactions  . Prednisone Other (See Comments)    Cannot tolerate ORALLY, but can tolerate by shot (interacts with anxiety & bipolarity)  . Lithium Carbonate Other (See Comments)    Patient was taken off of this because of a weight gain of 40 pounds  . Sulfa Antibiotics Other (See Comments)    From childhood; reaction not recalled  Labs:  Results for orders placed or performed during the hospital encounter of 08/30/19 (from the past 48 hour(s))  CBC     Status: Abnormal   Collection Time: 09/01/19  4:48 AM  Result Value Ref Range   WBC 8.5 4.0 - 10.5 K/uL   RBC 4.36 3.87 - 5.11 MIL/uL   Hemoglobin 9.4 (L) 12.0 - 15.0 g/dL   HCT 32.8 (L) 36 - 46 %   MCV 75.2 (L) 80.0 - 100.0 fL   MCH 21.6 (L) 26.0 - 34.0 pg   MCHC 28.7 (L) 30.0 - 36.0 g/dL   RDW 17.4 (H) 11.5 - 15.5 %   Platelets 319 150 - 400 K/uL   nRBC 0.0 0.0 - 0.2 %    Comment: Performed at [facility], [place] [address], [place], [place] [address]  Magnesium     Status: None   Collection Time: 09/01/19  4:48 AM  Result Value Ref Range   Magnesium 1.9 1.7 - 2.4 mg/dL    Comment: Performed at [facility], [place] [address], [place], [place] [address]  Comprehensive metabolic panel     Status: Abnormal   Collection Time: 09/01/19  4:48 AM  Result Value Ref Range   Sodium 134 (L) 135 - 145 mmol/L   Potassium 3.3 (L) 3.5 - 5.1 mmol/L   Chloride 99 98 - 111 mmol/L   CO2 25 22 - 32 mmol/L   Glucose, Bld 116 (H) 70 - 99 mg/dL    Comment: Glucose reference range applies only to samples taken after fasting for at least 8 hours.   BUN 9 8 - 23 mg/dL   Creatinine, Ser 0.80 0.44 - 1.00 mg/dL   Calcium 8.8 (L) 8.9 - 10.3 mg/dL    Total Protein 6.3 (L) 6.5 - 8.1 g/dL   Albumin 3.4 (L) 3.5 - 5.0 g/dL   AST 14 (L) 15 - 41 U/L   ALT 11 0 - 44 U/L   Alkaline Phosphatase 65 38 - 126 U/L   Total Bilirubin 0.8 0.3 - 1.2 mg/dL   GFR calc non Af Amer >60 >60 mL/min   GFR calc Af Amer >60 >60 mL/min   Anion gap 10 5 - 15    Comment: Performed at [facility], [place] [address] [place], [place] [address]  Glucose, capillary     Status: Abnormal   Collection Time: 09/01/19 12:10 PM  Result Value Ref Range   Glucose-Capillary 112 (H) 70 - 99 mg/dL    Comment: Glucose reference range applies only to samples taken after fasting for at least 8 hours.  Glucose, capillary     Status: Abnormal   Collection Time: 09/01/19  5:38 PM  Result Value Ref Range   Glucose-Capillary 145 (H) 70 - 99 mg/dL    Comment: Glucose reference range applies only to samples taken after fasting for at least 8 hours.  Glucose, capillary     Status: Abnormal   Collection Time: 09/02/19 12:15 AM  Result Value Ref Range   Glucose-Capillary 127 (H) 70 - 99 mg/dL    Comment: Glucose reference range applies only to samples taken after fasting for at least 8 hours.  CBC     Status: Abnormal   Collection Time: 09/02/19  4:59 AM  Result Value Ref Range   WBC 8.3 4.0 - 10.5 K/uL   RBC 4.73 3.87 - 5.11 MIL/uL   Hemoglobin 10.3 (L) 12.0 - 15.0 g/dL   HCT 36.3 36 -  46 %   MCV 76.7 (L) 80.0 - 100.0 fL   MCH 21.8 (L) 26.0 - 34.0 pg   MCHC 28.4 (L) 30.0 - 36.0 g/dL   RDW 17.6 (H) 11.5 - 15.5 %   Platelets 283 150 - 400 K/uL   nRBC 0.0 0.0 - 0.2 %    Comment: Performed at [facility], [place] [address], [place], [place] [address]  Magnesium     Status: None   Collection Time: 09/02/19  4:59 AM  Result Value Ref Range   Magnesium 2.2 1.7 - 2.4 mg/dL    Comment: Performed at [facility], [place] [address], [place], [place] [address]  Comprehensive metabolic panel     Status: Abnormal   Collection Time:  09/02/19  4:59 AM  Result Value Ref Range   Sodium 132 (L) 135 - 145 mmol/L   Potassium 2.9 (L) 3.5 - 5.1 mmol/L   Chloride 96 (L) 98 - 111 mmol/L   CO2 25 22 - 32 mmol/L   Glucose, Bld 134 (H) 70 - 99 mg/dL    Comment: Glucose reference range applies only to samples taken after fasting for at least 8 hours.   BUN 15 8 - 23 mg/dL   Creatinine, Ser 0.97 0.44 - 1.00 mg/dL   Calcium 9.6 8.9 - 10.3 mg/dL   Total Protein 6.9 6.5 - 8.1 g/dL   Albumin 3.8 3.5 - 5.0 g/dL   AST 23 15 - 41 U/L   ALT 14 0 - 44 U/L   Alkaline Phosphatase 74 38 - 126 U/L   Total Bilirubin 0.7 0.3 - 1.2 mg/dL   GFR calc non Af Amer 59 (L) >60 mL/min   GFR calc Af Amer >60 >60 mL/min   Anion gap 11 5 - 15    Comment: Performed at [facility], [place] [address] [place], [place] [address]  Glucose, capillary     Status: Abnormal   Collection Time: 09/02/19  5:51 AM  Result Value Ref Range   Glucose-Capillary 110 (H) 70 - 99 mg/dL    Comment: Glucose reference range applies only to samples taken after fasting for at least 8 hours.  Glucose, capillary     Status: Abnormal   Collection Time: 09/02/19 11:55 AM  Result Value Ref Range   Glucose-Capillary 147 (H) 70 - 99 mg/dL    Comment: Glucose reference range applies only to samples taken after fasting for at least 8 hours.    Current Facility-Administered Medications  Medication Dose Route Frequency Provider Last Rate Last Admin  . acetaminophen (TYLENOL) tablet 650 mg  650 mg Oral Q6H PRN Vernelle Emerald, MD   650 mg at 09/01/19 2315   Or  . acetaminophen (TYLENOL) suppository 650 mg  650 mg Rectal Q6H PRN Vernelle Emerald, MD      . albuterol (PROVENTIL) (2.5 MG/3ML) 0.083% nebulizer solution 3 mL  3 mL Inhalation Q6H PRN Shalhoub, Sherryll Burger, MD      . buPROPion (WELLBUTRIN XL) 24 hr tablet 300 mg  300 mg Oral Daily Shalhoub, Sherryll Burger, MD   300 mg at 09/02/19 0920  . famotidine (PEPCID) tablet 20 mg  20 mg Oral QHS Amin, Ankit Chirag, MD    20 mg at 09/01/19 2200  . ferrous sulfate tablet 325 mg  325 mg Oral BID WC Amin, Ankit Chirag, MD   325 mg at 09/02/19 0906  . FLUoxetine (PROZAC) capsule 20 mg  20 mg Oral BID  Vernelle Emerald, MD   20 mg at 09/02/19 0920  . gabapentin (NEURONTIN) capsule 600 mg  600 mg Oral TID Vernelle Emerald, MD   600 mg at 09/02/19 0921  . levothyroxine (SYNTHROID) tablet 112 mcg  112 mcg Oral Q0600 Vernelle Emerald, MD   112 mcg at 09/02/19 0514  . LORazepam (ATIVAN) injection 0-4 mg  0-4 mg Intravenous Q12H Shalhoub, Sherryll Burger, MD       Or  . LORazepam (ATIVAN) tablet 0-4 mg  0-4 mg Oral Q12H Shalhoub, Sherryll Burger, MD      . methocarbamol (ROBAXIN) tablet 500 mg  500 mg Oral BID Vernelle Emerald, MD   500 mg at 09/02/19 0920  . Oxcarbazepine (TRILEPTAL) tablet 600 mg  600 mg Oral BID Vernelle Emerald, MD   600 mg at 09/02/19 0921  . pantoprazole (PROTONIX) EC tablet 40 mg  40 mg Oral BID AC Amin, Ankit Chirag, MD   40 mg at 09/02/19 0906  . polyethylene glycol (MIRALAX / GLYCOLAX) packet 17 g  17 g Oral Daily PRN Amin, Ankit Chirag, MD      . potassium chloride (KLOR-CON) CR tablet 30 mEq  30 mEq Oral Once Amin, Ankit Chirag, MD      . prochlorperazine (COMPAZINE) injection 10 mg  10 mg Intravenous Q6H PRN Shalhoub, Sherryll Burger, MD   10 mg at 08/31/19 2026  . QUEtiapine (SEROQUEL) tablet 100 mg  100 mg Oral QHS Amin, Ankit Chirag, MD   100 mg at 09/01/19 2200  . senna-docusate (Senokot-S) tablet 2 tablet  2 tablet Oral BID Damita Lack, MD   2 tablet at 09/02/19 0921  . sucralfate (CARAFATE) 1 GM/10ML suspension 1 g  1 g Oral TID WC & HS Amin, Ankit Chirag, MD   1 g at 09/02/19 1401  . thiamine tablet 100 mg  100 mg Oral Daily Shalhoub, Sherryll Burger, MD   100 mg at 09/02/19 0920   Or  . thiamine (B-1) injection 100 mg  100 mg Intravenous Daily Shalhoub, Sherryll Burger, MD        Musculoskeletal: Strength & Muscle Tone: within normal limits Gait & Station: normal Patient leans: N/A  Psychiatric  Specialty Exam: Physical Exam  Nursing note and vitals reviewed. Constitutional: She is oriented to person, place, and time. She appears well-developed.  HENT:  Head: Normocephalic.  Cardiovascular: Normal rate.  Respiratory: Effort normal.  Neurological: She is alert and oriented to person, place, and time.  Psychiatric: Her behavior is normal. Mood normal.    Review of Systems  Constitutional: Negative.   HENT: Negative.   Eyes: Negative.   Respiratory: Negative.   Cardiovascular: Negative.   Gastrointestinal: Negative.   Genitourinary: Negative.   Musculoskeletal: Negative.   Skin: Negative.   Neurological: Negative.   Psychiatric/Behavioral: Negative.     Blood pressure 128/69, pulse 70, temperature 98.1 F (36.7 C), temperature source Oral, resp. rate 14, height 5\' 6"  (1.676 m), weight 72.2 kg, SpO2 95 %.Body mass index is 25.69 kg/m.  General Appearance: Casual and Fairly Groomed  Eye Contact:  Good  Speech:  Clear and Coherent and Normal Rate  Volume:  Normal  Mood:  Euthymic  Affect:  Congruent  Thought Process:  Coherent, Goal Directed and Descriptions of Associations: Intact  Orientation:  Full (Time, Place, and Person)  Thought Content:  WDL and Logical  Suicidal Thoughts:  No  Homicidal Thoughts:  No  Memory:  Immediate;   Fair Recent;  Fair Remote;   Fair  Judgement:  Fair  Insight:  Fair  Psychomotor Activity:  Normal  Concentration:  Concentration: Fair and Attention Span: Fair  Recall:  [place] of Knowledge:  Good  Language:  Good  Akathisia:  No  Handed:  Right  AIMS (if indicated):     Assets:  Communication Skills Desire for Improvement Financial Resources/Insurance Housing Intimacy Leisure Time Physical Health Resilience Social Support  ADL's:  Intact  Cognition:  WNL  Sleep:        Treatment Plan Summary: Patient discussed with Dr. Dwyane Dee. Patient is a 69 year old female who currently denies all crisis criteria. Patient  insightful regarding relapse on alcohol, marijuana and xanax. Based on my assessment today patient would benefit from follow up with outpatient substance use treatment resources and established outpatient psychiatry.   Recommendations: - Social work consult to provide substance use treatment resources.  - Follow up with established outpatient psychiatry provider.   Disposition: No evidence of imminent risk to self or others at present.   Patient does not meet criteria for psychiatric inpatient admission. Supportive therapy provided about ongoing stressors.  Emmaline Kluver, FNP 09/02/2019 3:59 PM

## 2019-09-03 DIAGNOSIS — R112 Nausea with vomiting, unspecified: Secondary | ICD-10-CM | POA: Diagnosis not present

## 2019-09-03 LAB — GLUCOSE, CAPILLARY
Glucose-Capillary: 117 mg/dL — ABNORMAL HIGH (ref 70–99)
Glucose-Capillary: 108 mg/dL — ABNORMAL HIGH (ref 70–99)
Glucose-Capillary: 137 mg/dL — ABNORMAL HIGH (ref 70–99)

## 2019-09-03 LAB — COMPREHENSIVE METABOLIC PANEL
ALT: 13 U/L (ref 0–44)
AST: 16 U/L (ref 15–41)
Albumin: 3.4 g/dL — ABNORMAL LOW (ref 3.5–5.0)
Alkaline Phosphatase: 65 U/L (ref 38–126)
Anion gap: 10 (ref 5–15)
BUN: 10 mg/dL (ref 8–23)
CO2: 25 mmol/L (ref 22–32)
Calcium: 9 mg/dL (ref 8.9–10.3)
Chloride: 99 mmol/L (ref 98–111)
Creatinine, Ser: 0.76 mg/dL (ref 0.44–1.00)
GFR calc Af Amer: >60 mL/min (ref >60)
GFR calc non Af Amer: >60 mL/min (ref >60)
Glucose, Bld: 118 mg/dL — ABNORMAL HIGH (ref 70–99)
Potassium: 4.1 mmol/L (ref 3.5–5.1)
Sodium: 134 mmol/L — ABNORMAL LOW (ref 135–145)
Total Bilirubin: 0.6 mg/dL (ref 0.3–1.2)
Total Protein: 6.3 g/dL — ABNORMAL LOW (ref 6.5–8.1)

## 2019-09-03 LAB — CBC
HCT: 30.4 % — ABNORMAL LOW (ref 36.0–46.0)
Hemoglobin: 8.8 g/dL — ABNORMAL LOW (ref 12.0–15.0)
MCH: 21.8 pg — ABNORMAL LOW (ref 26.0–34.0)
MCHC: 28.9 g/dL — ABNORMAL LOW (ref 30.0–36.0)
MCV: 75.4 fL — ABNORMAL LOW (ref 80.0–100.0)
Platelets: 265 10*3/uL (ref 150–400)
RBC: 4.03 MIL/uL (ref 3.87–5.11)
RDW: 17.2 % — ABNORMAL HIGH (ref 11.5–15.5)
WBC: 7.1 10*3/uL (ref 4.0–10.5)
nRBC: 0 % (ref 0.0–0.2)

## 2019-09-03 LAB — MAGNESIUM: Magnesium: 2.1 mg/dL (ref 1.7–2.4)

## 2019-09-03 MED ORDER — POLYETHYLENE GLYCOL 3350 17 G PO PACK: 17.0000 g | PACK | Freq: Every day | ORAL | 0 refills | Status: DC | PRN

## 2019-09-03 MED ORDER — ALPRAZOLAM 1 MG PO TABS
1.0000 mg | ORAL_TABLET | Freq: Two times a day (BID) | ORAL | Status: DC | PRN
  Administered 2019-09-04: 1 mg via ORAL
  Filled 2019-09-03: qty 1

## 2019-09-03 MED ORDER — PANTOPRAZOLE SODIUM 40 MG PO TBEC: 40.0000 mg | DELAYED_RELEASE_TABLET | Freq: Two times a day (BID) | ORAL | 0 refills | Status: DC

## 2019-09-03 MED ORDER — SENNOSIDES-DOCUSATE SODIUM 8.6-50 MG PO TABS: 2.0000 | ORAL_TABLET | Freq: Two times a day (BID) | ORAL | 0 refills | Status: AC

## 2019-09-03 MED ORDER — FERROUS SULFATE 325 (65 FE) MG PO TABS: 325.0000 mg | ORAL_TABLET | Freq: Two times a day (BID) | ORAL | 0 refills | Status: DC

## 2019-09-03 MED ORDER — FAMOTIDINE 20 MG PO TABS: 20.0000 mg | ORAL_TABLET | Freq: Every day | ORAL | 0 refills | Status: DC

## 2019-09-03 NOTE — Discharge Summary (Addendum)
Physician Discharge Summary  LEGACY CARRENDER EGB:[id number] DOB: 1950/06/11 DOA: 08/30/2019  PCP: [name], MD  Admit date: 08/30/2019 Discharge date: 09/04/19  Admitted From:Home  Disposition: Home  Recommendations for Outpatient Follow-up:  1. Follow up with PCP in 1-2 weeks 2. Please obtain BMP/CBC in one week your next doctors visit.  3. PPI twice daily, Pepcid daily 4. Advised to quit drinking alcohol-will need outpatient psych 5. Iron supplements with bowel regimen   Discharge Condition: Stable CODE STATUS: Full code Diet recommendation: Regular  Brief/Interim Summary: 69 year old with history of bipolar disorder, HLD, GERD, hypothyroidism, alcohol use initially presented to [facility], ER with complaints of abdominal pain and nausea.  Apparently patient has been a heavy drinker.  Routine lab work within normal limits, CT abdomen pelvis was negative for any acute pathology.  Patient was conservatively managed, found to be iron deficient.  Case discussed with [facility]-recommends outpatient follow-up as she is due for endoscopy and colonoscopy but it is not emergent at this time.  PT/OT recommended home health therefore arrangements made.  I spoke extensively with the patient's daughter regarding her care during the hospitalization and even on the day of discharge.  We are in agreement with the plan.   Assessment & Plan:   Principal Problem:   Intractable nausea and vomiting Active Problems:   Hypothyroidism   Bipolar disorder (HCC)   GERD (gastroesophageal reflux disease)   Epigastric pain   Alcohol abuse   Microcytic anemia  Intractable nausea and vomiting, improved, epigastric pain-suspect alcohol gastritis-resolved -Lipase normal, CT abdomen pelvis-fatty infiltrate otherwise no evidence of pancreatitis.  LFTs within normal limits, right upper quadrant ultrasound-cholelithiasis without evidence of cholecystitis.  UA-negative. -Continue PPI twice daily.   Pepcid.  Prescriptions given -Spoke with [facility], she will require endoscopic evaluation on nonemergent basis.  This will be arranged outpatient -Tolerating orals without any issues  Unsteady gait and generalized weakness -PT/OT-home health arrangements made  Microcytic anemia, iron deficiency -Status post IV iron, p.o. regimen with stool softeners prescribed -Will require outpatient EGD/colonoscopy with [facility].  Altered Mental Status, resolved -CT of the head-negative, show small calcified hemangioma.  Her exam is mostly nonfocal.  We will continue neurochecks -Ammonia-normal.    TSH normal -Mentation back to baseline. -Seen by psychiatry-recommend outpatient follow-up  Hypothyroidism -TSH within normal limits  Bipolar disease -Resume home meds, seen by psychiatry. Currently on-Trileptal, Wellbutrin, Prozac, Seroquel  Discharge Diagnoses:  Principal Problem:   Intractable nausea and vomiting Active Problems:   Hypothyroidism   Bipolar disorder (HCC)   GERD (gastroesophageal reflux disease)   Epigastric pain   Alcohol abuse   Microcytic anemia    Consultations:  Psychiatry  Subjective: Feels okay wants to go home.  Discharge Exam: Vitals:   09/02/19 2003 09/03/19 0505  BP:  (!) 159/91  Pulse: 70 78  Resp: 16 14  Temp: 98.8 F (37.1 C) 98.2 F (36.8 C)  SpO2: 93% 93%   Vitals:   09/02/19 1504 09/02/19 1959 09/02/19 2003 09/03/19 0505  BP: 128/69 (!) 108/94  (!) 159/91  Pulse: 70 70 70 78  Resp: 14  16 14   Temp: 98.1 F (36.7 C)  98.8 F (37.1 C) 98.2 F (36.8 C)  TempSrc: Oral  Oral Oral  SpO2: 95%  93% 93%  Weight:      Height:        General: Pt is alert, awake, not in acute distress Cardiovascular: RRR, S1/S2 +, no rubs, no gallops Respiratory: CTA bilaterally,  no wheezing, no rhonchi Abdominal: Soft, NT, ND, bowel sounds + Extremities: no edema, no cyanosis  Discharge Instructions   Allergies as of 09/03/2019       Reactions   Prednisone Other (See Comments)   Cannot tolerate ORALLY, but can tolerate by shot (interacts with anxiety & bipolarity)   Lithium Carbonate Other (See Comments)   Patient was taken off of this because of a weight gain of 40 pounds   Sulfa Antibiotics Other (See Comments)   From childhood; reaction not recalled      Medication List    STOP taking these medications   omeprazole 20 MG capsule Commonly known as: PRILOSEC     TAKE these medications   albuterol 108 (90 Base) MCG/ACT inhaler Commonly known as: VENTOLIN HFA Inhale 2 puffs into the lungs every 6 (six) hours as needed for wheezing or shortness of breath.   buPROPion 150 MG 24 hr tablet Commonly known as: WELLBUTRIN XL Take 300 mg by mouth daily.   calcium citrate-vitamin D 315-200 MG-UNIT tablet Commonly known as: Calcium + D Take 1 tablet by mouth daily. For bone health   chlordiazePOXIDE 25 MG capsule Commonly known as: LIBRIUM 50mg  PO TID x 1D, then 25-50mg  PO BID X 1D, then 25-50mg  PO QD X 1D   cyclobenzaprine 10 MG tablet Commonly known as: FLEXERIL Take 10 mg by mouth 3 (three) times daily as needed for muscle spasms.   denosumab 60 MG/ML Soln injection Commonly known as: PROLIA Inject 60 mg into the skin every 6 (six) months. Administer in upper arm, thigh, or abdomen  NEXT IS DUE 03-2017   EPINEPHrine 0.3 mg/0.3 mL Soaj injection Commonly known as: EPI-PEN Inject 0.3 mg into the skin as needed for anaphylaxis.   famotidine 20 MG tablet Commonly known as: PEPCID Take 1 tablet (20 mg total) by mouth at bedtime.   ferrous sulfate 325 (65 FE) MG tablet Take 1 tablet (325 mg total) by mouth 2 (two) times daily with a meal.   Flovent HFA 110 MCG/ACT inhaler Generic drug: fluticasone   FLUoxetine 20 MG capsule Commonly known as: PROZAC Take 20 mg by mouth 2 (two) times daily.   gabapentin 600 MG tablet Commonly known as: NEURONTIN Take 600 mg by mouth 3 (three) times daily.    levothyroxine 112 MCG tablet Commonly known as: SYNTHROID Take 112 mcg by mouth daily before breakfast.   methocarbamol 500 MG tablet Commonly known as: ROBAXIN Take 500 mg by mouth 2 (two) times daily.   multivitamin tablet Take 1 tablet by mouth daily. For low vitamin   naproxen sodium 220 MG tablet Commonly known as: ALEVE Take 220-440 mg by mouth 2 (two) times daily as needed (for pain).   ondansetron 4 MG disintegrating tablet Commonly known as: Zofran ODT Take 1 tablet (4 mg total) by mouth every 8 (eight) hours as needed for nausea or vomiting.   ondansetron 8 MG tablet Commonly known as: ZOFRAN Take 8 mg by mouth every 8 (eight) hours as needed for nausea or vomiting (Migraine Head Ache symptoms).   oxcarbazepine 600 MG tablet Commonly known as: TRILEPTAL Take 600 mg by mouth 2 (two) times daily.   Oxycodone HCl 10 MG Tabs Take 10 mg by mouth daily as needed (for breakthrough pain).   pantoprazole 40 MG tablet Commonly known as: PROTONIX Take 1 tablet (40 mg total) by mouth 2 (two) times daily before a meal.   polyethylene glycol 17 g packet Commonly known as: MIRALAX /  GLYCOLAX Take 17 g by mouth daily as needed for moderate constipation or severe constipation.   QUEtiapine 100 MG tablet Commonly known as: SEROQUEL Take 100-200 mg by mouth at bedtime.   rizatriptan 10 MG disintegrating tablet Commonly known as: MAXALT-MLT Take 10 mg by mouth as needed for migraine.   senna-docusate 8.6-50 MG tablet Commonly known as: Senokot-S Take 2 tablets by mouth 2 (two) times daily.   sucralfate 1 GM/10ML suspension Commonly known as: Carafate Take 10 mLs (1 g total) by mouth 4 (four) times daily -  with meals and at bedtime.   valACYclovir 500 MG tablet Commonly known as: VALTREX Take one tablet twice daily 3-5 days then daily as needed.   Xtampza ER 13.5 MG C12a Generic drug: oxyCODONE ER Take 13.5 mg by mouth 2 (two) times daily.            Durable  Medical Equipment  (From admission, onward)         Start     Ordered   09/03/19 1010  For home use only DME Bedside commode  Once       Question:  Patient needs a bedside commode to treat with the following condition  Answer:  Fear for personal safety   09/03/19 [place] DEPT.   Specialty: Emergency Medicine Why: As needed Contact information: [place] [id number] [place] [address] [phone]       [name], MD. Schedule an appointment as soon as possible for a visit in 3 days.   Specialty: Family Medicine Why: For reevaluation of symptoms Contact information: [address] [place] [address] [phone]              Allergies  Allergen Reactions  . Prednisone Other (See Comments)    Cannot tolerate ORALLY, but can tolerate by shot (interacts with anxiety & bipolarity)  . Lithium Carbonate Other (See Comments)    Patient was taken off of this because of a weight gain of 40 pounds  . Sulfa Antibiotics Other (See Comments)    From childhood; reaction not recalled    You were cared for by a hospitalist during your hospital stay. If you have any questions about your discharge medications or the care you received while you were in the hospital after you are discharged, you can call the unit and asked to speak with the hospitalist on call if the hospitalist that took care of you is not available. Once you are discharged, your primary care physician will handle any further medical issues. Please note that no refills for any discharge medications will be authorized once you are discharged, as it is imperative that you return to your primary care physician (or establish a relationship with a primary care physician if you do not have one) for your aftercare needs so that they can reassess your need for medications and monitor your lab  values.   Procedures/Studies: CT HEAD WO CONTRAST  Result Date: 08/31/2019 CLINICAL DATA:  Altered mental status with unclear cause EXAM: CT HEAD WITHOUT CONTRAST TECHNIQUE: Contiguous axial images were obtained from the base of the skull through the vertex without intravenous contrast. COMPARISON:  11/12/2013 FINDINGS: Brain: No evidence of acute infarction, hemorrhage, hydrocephalus, extra-axial collection or mass effect. 1.4 cm nodular calcification in the left para median high vertex, stable and most consistent with meningioma. Possible small remote right superior  cerebellar infarction. Vascular: Atherosclerotic calcification Skull: Rounded ground-glass lesion in the paramedian left frontal bone that is benign and stable from 2015. Sinuses/Orbits: Bilateral cataract resection IMPRESSION: 1. No acute finding. 2. Incidental, and small calcified meningioma at the vertex. Electronically Signed   By: Monte Fantasia M.D.   On: 08/31/2019 06:35   CT ABDOMEN PELVIS W CONTRAST  Result Date: 08/31/2019 CLINICAL DATA:  Pancreatitis suspected EXAM: CT ABDOMEN AND PELVIS WITH CONTRAST TECHNIQUE: Multidetector CT imaging of the abdomen and pelvis was performed using the standard protocol following bolus administration of intravenous contrast. CONTRAST:  157mL OMNIPAQUE IOHEXOL 300 MG/ML  SOLN COMPARISON:  05/08/2019 FINDINGS: Lower chest: Large hiatal hernia.  No acute abnormality. Hepatobiliary: Diffuse low-density throughout the liver compatible with fatty infiltration. No focal abnormality. Gallbladder unremarkable. Pancreas: No focal abnormality or ductal dilatation. No surrounding inflammation to suggest acute pancreatitis. Spleen: No focal abnormality.  Normal size. Adrenals/Urinary Tract: Stable small hypodensity in the upper pole of the left kidney compatible with small cyst. No hydronephrosis. Adrenal glands and urinary bladder unremarkable. Stomach/Bowel: Colonic diverticulosis. No active diverticulitis.  Stomach and small bowel decompressed. Vascular/Lymphatic: Aortic atherosclerosis. No enlarged abdominal or pelvic lymph nodes. Reproductive: Uterus and adnexa unremarkable.  No mass. Other: No free fluid or free air. Musculoskeletal: No acute bony abnormality. Postoperative changes in the lumbar spine. IMPRESSION: Diffuse fatty infiltration of the liver. No evidence of acute or chronic pancreatitis by CT. Large hiatal hernia. Aortic atherosclerosis. Colonic diverticulosis. Electronically Signed   By: Rolm Baptise M.D.   On: 08/31/2019 00:46   US Abdomen Limited RUQ  Result Date: 08/31/2019 CLINICAL DATA:  Abdominal pain and altered mental status EXAM: ULTRASOUND ABDOMEN LIMITED RIGHT UPPER QUADRANT COMPARISON:  Abdominal CT from earlier today FINDINGS: Gallbladder: Nonshadowing but discrete echogenic ovoid structures in the gallbladder consistent with calculi. No wall thickening or focal tenderness. Common bile duct: Diameter: 5 mm Liver: No focal lesion identified. Within normal limits in parenchymal echogenicity. Portal vein is patent on color Doppler imaging with normal direction of blood flow towards the liver. IMPRESSION: Cholelithiasis without evidence of cholecystitis. Electronically Signed   By: Monte Fantasia M.D.   On: 08/31/2019 05:49      The results of significant diagnostics from this hospitalization (including imaging, microbiology, ancillary and laboratory) are listed below for reference.     Microbiology: Recent Results (from the past 240 hour(s))  SARS Coronavirus 2 by RT PCR (hospital order, performed in [facility] hospital lab) Nasopharyngeal Nasopharyngeal Swab     Status: None   Collection Time: 08/31/19  3:43 AM   Specimen: Nasopharyngeal Swab  Result Value Ref Range Status   SARS Coronavirus 2 NEGATIVE NEGATIVE Final    Comment: (NOTE) SARS-CoV-2 target nucleic acids are NOT DETECTED.  The SARS-CoV-2 RNA is generally detectable in upper and lower respiratory specimens  during the acute phase of infection. The lowest concentration of SARS-CoV-2 viral copies this assay can detect is 250 copies / mL. A negative result does not preclude SARS-CoV-2 infection and should not be used as the sole basis for treatment or other patient management decisions.  A negative result may occur with improper specimen collection / handling, submission of specimen other than nasopharyngeal swab, presence of viral mutation(s) within the areas targeted by this assay, and inadequate number of viral copies (<250 copies / mL). A negative result must be combined with clinical observations, patient history, and epidemiological information.  Fact Sheet for Patients:   StrictlyIdeas.no  Fact Sheet for Healthcare Providers:  BankingDealers.co.za  This test is not yet approved or  cleared by the [place] and has been authorized for detection and/or diagnosis of SARS-CoV-2 by FDA under an Emergency Use Authorization (EUA).  This EUA will remain in effect (meaning this test can be used) for the duration of the COVID-19 declaration under Section 564(b)(1) of the Act, 21 U.S.C. section 360bbb-3(b)(1), unless the authorization is terminated or revoked sooner.  Performed at [facility], [place] [address] [place], [place] [address]      Labs: BNP (last 3 results) No results for input(s): BNP in the last 8760 hours. Basic Metabolic Panel: Recent Labs  Lab 08/30/19 2215 08/31/19 1240 09/01/19 0448 09/02/19 0459 09/03/19 0439  NA 140 137 134* 132* 134*  K 4.3 3.3* 3.3* 2.9* 4.1  CL 103 98 99 96* 99  CO2 22 27 25 25 25   GLUCOSE 161* 131* 116* 134* 118*  BUN 9 7* 9 15 10   CREATININE 0.56 0.62 0.80 0.97 0.76  CALCIUM 9.5 8.8* 8.8* 9.6 9.0  MG  --  1.9 1.9 2.2 2.1   Liver Function Tests: Recent Labs  Lab 08/30/19 2215 08/31/19 1240 09/01/19 0448 09/02/19 0459 09/03/19 0439  AST 19 18 14* 23 16  ALT  14 13 11 14 13   ALKPHOS 86 86 65 74 65  BILITOT 0.6 0.8 0.8 0.7 0.6  PROT 8.1 8.0 6.3* 6.9 6.3*  ALBUMIN 4.4 4.2 3.4* 3.8 3.4*   Recent Labs  Lab 08/30/19 2215  LIPASE 23   Recent Labs  Lab 08/31/19 1240  AMMONIA 18   CBC: Recent Labs  Lab 08/30/19 2215 08/31/19 1240 09/01/19 0448 09/02/19 0459 09/03/19 0439  WBC 9.7 12.0* 8.5 8.3 7.1  NEUTROABS 8.9* 10.2*  --   --   --   HGB 11.0* 10.4* 9.4* 10.3* 8.8*  HCT 38.0 36.3 32.8* 36.3 30.4*  MCV 75.5* 75.0* 75.2* 76.7* 75.4*  PLT 247 417* 319 283 265   Cardiac Enzymes: No results for input(s): CKTOTAL, CKMB, CKMBINDEX, TROPONINI in the last 168 hours. BNP: Invalid input(s): POCBNP CBG: Recent Labs  Lab 09/02/19 0551 09/02/19 1155 09/02/19 1805 09/02/19 2334 09/03/19 0507  GLUCAP 110* 147* 136* 151* 117*   D-Dimer No results for input(s): DDIMER in the last 72 hours. Hgb A1c No results for input(s): HGBA1C in the last 72 hours. Lipid Profile No results for input(s): CHOL, HDL, LDLCALC, TRIG, CHOLHDL, LDLDIRECT in the last 72 hours. Thyroid function studies Recent Labs    08/31/19 1240  TSH 2.086   Anemia work up Recent Labs    08/31/19 1240  VITAMINB12 406  FOLATE 6.3  TIBC 582*  IRON 28   Urinalysis    Component Value Date/Time   COLORURINE STRAW (A) 08/30/2019 2215   APPEARANCEUR HAZY (A) 08/30/2019 2215   LABSPEC 1.010 08/30/2019 2215   PHURINE 8.0 08/30/2019 2215   GLUCOSEU 50 (A) 08/30/2019 2215   HGBUR SMALL (A) 08/30/2019 2215   BILIRUBINUR NEGATIVE 08/30/2019 2215   KETONESUR 5 (A) 08/30/2019 2215   PROTEINUR 30 (A) 08/30/2019 2215   UROBILINOGEN 0.2 11/12/2013 2233   NITRITE NEGATIVE 08/30/2019 2215   LEUKOCYTESUR NEGATIVE 08/30/2019 2215   Sepsis Labs Invalid input(s): PROCALCITONIN,  WBC,  LACTICIDVEN Microbiology Recent Results (from the past 240 hour(s))  SARS Coronavirus 2 by RT PCR (hospital order, performed in [facility] hospital lab) Nasopharyngeal Nasopharyngeal Swab      Status: None   Collection Time: 08/31/19  3:43 AM  Specimen: Nasopharyngeal Swab  Result Value Ref Range Status   SARS Coronavirus 2 NEGATIVE NEGATIVE Final    Comment: (NOTE) SARS-CoV-2 target nucleic acids are NOT DETECTED.  The SARS-CoV-2 RNA is generally detectable in upper and lower respiratory specimens during the acute phase of infection. The lowest concentration of SARS-CoV-2 viral copies this assay can detect is 250 copies / mL. A negative result does not preclude SARS-CoV-2 infection and should not be used as the sole basis for treatment or other patient management decisions.  A negative result may occur with improper specimen collection / handling, submission of specimen other than nasopharyngeal swab, presence of viral mutation(s) within the areas targeted by this assay, and inadequate number of viral copies (<250 copies / mL). A negative result must be combined with clinical observations, patient history, and epidemiological information.  Fact Sheet for Patients:   StrictlyIdeas.no  Fact Sheet for Healthcare Providers: BankingDealers.co.za  This test is not yet approved or  cleared by the [place] FDA and has been authorized for detection and/or diagnosis of SARS-CoV-2 by FDA under an Emergency Use Authorization (EUA).  This EUA will remain in effect (meaning this test can be used) for the duration of the COVID-19 declaration under Section 564(b)(1) of the Act, 21 U.S.C. section 360bbb-3(b)(1), unless the authorization is terminated or revoked sooner.  Performed at [facility], [place] [address], [place], [place] [address]      Time coordinating discharge:  I have spent 35 minutes face to face with the patient and on the ward discussing the patients care, assessment, plan and disposition with other care givers. >50% of the time was devoted counseling the patient about the risks and benefits of  treatment/Discharge disposition and coordinating care.   SIGNED:   Damita Lack, MD  [facility] 09/03/2019, 11:35 AM   If 7PM-7AM, please contact night-coverage

## 2019-09-03 NOTE — Progress Notes (Signed)
Pt has been nauseous, vomiting, and heaving since mid morning.  Antipyretic medications gave no relief.    Patient stated that she feels like she is withdrawing and does not feel safe to go home.    MD notified and approved discharge delay.

## 2019-09-03 NOTE — Discharge Instructions (Signed)
Abdominal Pain, Adult Many things can cause belly (abdominal) pain. Most times, belly pain is not dangerous. Many cases of belly pain can be watched and treated at home. Sometimes, though, belly pain is serious. Your doctor will try to find the cause of your belly pain. Follow these instructions at home:  Medicines  Take over-the-counter and prescription medicines only as told by your doctor.  Do not take medicines that help you poop (laxatives) unless told by your doctor. General instructions  Watch your belly pain for any changes.  Drink enough fluid to keep your pee (urine) pale yellow.  Keep all follow-up visits as told by your doctor. This is important. Contact a doctor if:  Your belly pain changes or gets worse.  You are not hungry, or you lose weight without trying.  You are having trouble pooping (constipated) or have watery poop (diarrhea) for more than 2-3 days.  You have pain when you pee or poop.  Your belly pain wakes you up at night.  Your pain gets worse with meals, after eating, or with certain foods.  You are vomiting and cannot keep anything down.  You have a fever.  You have blood in your pee. Get help right away if:  Your pain does not go away as soon as your doctor says it should.  You cannot stop vomiting.  Your pain is only in areas of your belly, such as the right side or the left lower part of the belly.  You have bloody or black poop, or poop that looks like tar.  You have very bad pain, cramping, or bloating in your belly.  You have signs of not having enough fluid or water in your body (dehydration), such as: ? Dark pee, very little pee, or no pee. ? Cracked lips. ? Dry mouth. ? Sunken eyes. ? Sleepiness. ? Weakness.  You have trouble breathing or chest pain. Summary  Many cases of belly pain can be watched and treated at home.  Watch your belly pain for any changes.  Take over-the-counter and prescription medicines only as  told by your doctor.  Contact a doctor if your belly pain changes or gets worse.  Get help right away if you have very bad pain, cramping, or bloating in your belly. This information is not intended to replace advice given to you by your health care provider. Make sure you discuss any questions you have with your health care provider. Document Revised: 07/14/2018 Document Reviewed: 07/14/2018 Elsevier Patient Education  [place].   Alcohol Abuse and Dependence Information, Adult Alcohol is a widely available drug. People drink alcohol in different amounts. People who drink alcohol very often and in large amounts often have problems during and after drinking. They may develop what is called an alcohol use disorder. There are two main types of alcohol use disorders:  Alcohol abuse. This is when you use alcohol too much or too often. You may use alcohol to make yourself feel happy or to reduce stress. You may have a hard time setting a limit on the amount you drink.  Alcohol dependence. This is when you use alcohol consistently for a period of time, and your body changes as a result. This can make it hard to stop drinking because you may start to feel sick or feel different when you do not use alcohol. These symptoms are known as withdrawal. How can alcohol abuse and dependence affect me? Alcohol abuse and dependence can have a negative effect on  your life. Drinking too much can lead to addiction. You may feel like you need alcohol to function normally. You may drink alcohol before work in the morning, during the day, or as soon as you get home from work in the evening. These actions can result in:  Poor work performance.  Job loss.  Financial problems.  Car crashes or criminal charges from driving after drinking alcohol.  Problems in your relationships with friends and family.  Losing the trust and respect of coworkers, friends, and family. Drinking heavily over a long period of  time can permanently damage your body and brain, and can cause lifelong health issues, such as:  Damage to your liver or pancreas.  Heart problems, high blood pressure, or stroke.  Certain cancers.  Decreased ability to fight infections.  Brain or nerve damage.  Depression.  Early (premature) death. If you are careless or you crave alcohol, it is easy to drink more than your body can handle (overdose). Alcohol overdose is a serious situation that requires hospitalization. It may lead to permanent injuries or death. What can increase my risk?  Having a family history of alcohol abuse.  Having depression or other mental health conditions.  Beginning to drink at an early age.  Binge drinking often.  Experiencing trauma, stress, and an unstable home life during childhood.  Spending time with people who drink often. What actions can I take to prevent or manage alcohol abuse and dependence?  Do not drink alcohol if: ? Your health care provider tells you not to drink. ? You are pregnant, may be pregnant, or are planning to become pregnant.  If you drink alcohol: ? Limit how much you use to:  0-1 drink a day for women.  0-2 drinks a day for men. ? Be aware of how much alcohol is in your drink. In the [place] one drink equals one 12 oz bottle of beer (355 mL), one 5 oz glass of wine (148 mL), or one 1 oz glass of hard liquor (44 mL).  Stop drinking if you have been drinking too much. This can be very hard to do if you are used to abusing alcohol. If you begin to have withdrawal symptoms, talk with your health care provider or a person that you trust. These symptoms may include anxiety, shaky hands, headache, nausea, sweating, or not being able to sleep.  Choose to drink nonalcoholic beverages in social gatherings and places where there may be alcohol. Activity  Spend more time on activities that you enjoy that do not involve alcohol, like hobbies or exercise.  Find healthy  ways to cope with stress, such as exercise, meditation, or spending time with people you care about. General information  Talk to your family, coworkers, and friends about supporting you in your efforts to stop drinking. If they drink, ask them not to drink around you. Spend more time with people who do not drink alcohol.  If you think that you have an alcohol dependency problem: ? Tell friends or family about your concerns. ? Talk with your health care provider or another health professional about where to get help. ? Work with a Transport planner and a Regulatory affairs officer. ? Consider joining a support group for people who struggle with alcohol abuse and dependence. Where to find support   Your health care provider.  SMART Recovery: www.smartrecovery.org Therapy and support groups  Local treatment centers or chemical dependency counselors.  Local AA groups in your community: NicTax.com.pt Where to find more  information  Centers for Disease Control and Prevention: http://www.wolf.info/  National Institute on Alcohol Abuse and Alcoholism: http://www.bradshaw.com/  Alcoholics Anonymous (AA): [employer]Tax.com.pt Contact a health care provider if:  You drank more or for longer than you intended on more than one occasion.  You tried to stop drinking or to cut back on how much you drink, but you were not able to.  You often drink to the point of vomiting or passing out.  You want to drink so badly that you cannot think about anything else.  You have problems in your life due to drinking, but you continue to drink.  You keep drinking even though you feel anxious, depressed, or have experienced memory loss.  You have stopped doing the things you used to enjoy in order to drink.  You have to drink more than you used to in order to get the effect you want.  You experience anxiety, sweating, nausea, shakiness, and trouble sleeping when you try to stop drinking. Get help right away if:  You have thoughts  about hurting yourself or others.  You have serious withdrawal symptoms, including: ? Confusion. ? Racing heart. ? High blood pressure. ? Fever. If you ever feel like you may hurt yourself or others, or have thoughts about taking your own life, get help right away. You can go to your nearest emergency department or call:  Your local emergency services (911 in the [place]  A suicide crisis helpline, such as the [facility] at 701-523-7603. This is open 24 hours a day. Summary  Alcohol abuse and dependence can have a negative effect on your life. Drinking too much or too often can lead to addiction.  If you drink alcohol, limit how much you use.  If you are having trouble keeping your drinking under control, find ways to change your behavior. Hobbies, calming activities, exercise, or support groups can help.  If you feel you need help with changing your drinking habits, talk with your health care provider, a good friend, or a therapist, or go to an [facility] group. This information is not intended to replace advice given to you by your health care provider. Make sure you discuss any questions you have with your health care provider. Document Revised: 06/24/2018 Document Reviewed: 05/13/2018 Elsevier Patient Education  2020 [place].   Alcohol Withdrawal Syndrome Alcohol withdrawal syndrome is a group of symptoms that can develop when a person who drinks heavily and regularly stops drinking or drinks less. Alcohol withdrawal syndrome can be mild or severe, and it may even be life-threatening. Alcohol withdrawal syndrome usually affects people who have alcohol use disorder, which may also be called alcoholism. Alcohol use disorder is when a person is unable to control his or her alcohol use, and drinking too much or too often causes problems at home, at work, or in relationships. What are the causes? Drinking heavily and drinking on a regular basis cause changes in  brain chemistry. Over time, the body becomes dependent on alcohol. When alcohol use stops, the chemistry system in the brain becomes unbalanced and causes the symptoms of alcohol withdrawal. What increases the risk? Alcohol withdrawal syndrome is more likely to occur in people who drink more than the recommended limit of alcohol (2 drinks a day for men or 1 drink a day for non-pregnant women). It is also more likely to affect heavy drinkers who have been using alcohol for long periods of time. The more a person drinks and the longer he  or she drinks, the greater the risk of alcohol withdrawal syndrome. Severe withdrawal is more likely to develop in someone who:  Had severe alcohol withdrawal in the past.  Had a seizure during a previous episode of alcohol withdrawal.  Is elderly.  Uses other drugs.  Has a long-term (chronic) medical problem, such as heart, lung, or liver disease.  Has depression.  Does not get enough nutrients from his or her diet (malnutrition). What are the signs or symptoms? Symptoms of this condition can be mild to moderate, or they can be severe. Symptoms may develop a few hours (or up to a day) after a person changes his or her drinking patterns. During the 48 hours after he or she has stopped drinking, the following symptoms may go away or get better:  Uncontrollable shaking (tremor).  Sweating.  Headache.  Anxiety.  Inability to relax (agitation).  Trouble sleeping (insomnia).  Irregular heartbeats (palpitations).  Alcohol cravings.  Seizure. The following symptoms may get worse 24-48 hours after a person has decreased or stopped alcohol use, and they may gradually improve over a period of days or weeks:  Nausea and vomiting.  Fatigue.  Sensitivity to light and sounds.  Confusion and inability to think clearly.  Loss of appetite.  Mood swings, irritability, depression, and anxiety.  Insomnia and nightmares. The following symptoms are  severe and life-threatening. When these symptoms occur together, they are called delirium tremens (DTs):  High blood pressure.  Increased heart rate.  Trouble breathing.  Seizures. These may go away along with other symptoms, or they may persist.  Seeing, hearing, feeling, smelling, or tasting things that are not there (hallucinations). If you experience hallucinations, they usually begin 12-24 hours after a change in drinking patterns. Delirium tremens requires immediate hospitalization. How is this diagnosed? This condition may be diagnosed based on:  Your symptoms and medical history.  Your history of alcohol use. Your health care provider may ask questions about your drinking behavior. It is important to be honest when you answer these questions.  A psychological assessment.  A physical exam.  Blood tests or urine tests to measure blood alcohol level and to rule out other causes of symptoms.  MRI or CT scan. This may be done if you seem to have abnormal thinking or behaviors (altered mental status). Diagnosis can be difficult. People going through withdrawal often avoid seeking medical care and are not thinking clearly. Friends and family members play an important role in recognizing symptoms and encouraging loved ones to get treatment. How is this treated? Most people with symptoms of withdrawal can be treated outside of a hospital setting (outpatient treatment), with close monitoring such as daily check-ins with a health care provider and counseling. You may need treatment at a hospital or treatment center (inpatient treatment) if:  You have a history of delirium tremens or seizures.  You have severe symptoms.  You are addicted to other drugs.  You cannot swallow medicine.  You have a serious medical condition such as heart failure.  You experienced withdrawal in the past but then you continued drinking alcohol.  You are not likely to commit to an outpatient treatment  schedule. Treatment may involve:  Monitoring your blood pressure, pulse, and breathing.  IV fluids to keep you hydrated.  Medicines to reduce withdrawal symptoms and discomfort (benzodiazepines).  Medicine to reduce anxiety.  Medicine to prevent or control seizures.  Multivitamins and B vitamins.  Having a health care provider check on you daily. It is  important to get treatment for alcohol withdrawal early. Getting treatment early can:  Speed up your recovery from withdrawal symptoms.  Make you more successful with long-term stoppage of alcohol use (sobriety). If you need help to stop drinking, your health care provider may recommend a long-term treatment plan that includes:  Medicines to help treat alcohol use disorder.  Substance abuse counseling.  Support groups. Follow these instructions at home:   Take over-the-counter and prescription medicines (including vitamin supplements) only as told by your health care provider.  Do not drink alcohol.  Do not drive until your health care provider approves.  Have someone you trust stay with you or be available if you need help with your symptoms or with not drinking.  Drink enough fluid to keep your urine pale yellow.  Consider joining an alcohol support group or treatment program. These can provide emotional support, advice, and guidance.  Keep all follow-up visits as told by your health care provider. This is important. Contact a health care provider if:  Your symptoms get worse instead of better.  You cannot eat or drink without vomiting.  You are struggling with not drinking alcohol.  You cannot stop drinking alcohol. Get help right away if:  You have an irregular heartbeat.  You have chest pain.  You have trouble breathing.  You have a seizure for the first time.  You hallucinate.  You become very confused. Summary  Alcohol withdrawal is a group of symptoms that can develop when a person who drinks  heavily and regularly stops drinking or drinks less.  Symptoms of this condition can be mild to moderate, or they can be severe.  Treatment may include hospitalization, medicine, and counseling. This information is not intended to replace advice given to you by your health care provider. Make sure you discuss any questions you have with your health care provider. Document Revised: 02/15/2017 Document Reviewed: 11/09/2016 Elsevier Patient Education  [place].   Abdominal Pain, Adult Pain in the abdomen (abdominal pain) can be caused by many things. Often, abdominal pain is not serious and it gets better with no treatment or by being treated at home. However, sometimes abdominal pain is serious. Your health care provider will ask questions about your medical history and do a physical exam to try to determine the cause of your abdominal pain. Follow these instructions at home:  Medicines  Take over-the-counter and prescription medicines only as told by your health care provider.  Do not take a laxative unless told by your health care provider. General instructions  Watch your condition for any changes.  Drink enough fluid to keep your urine pale yellow.  Keep all follow-up visits as told by your health care provider. This is important. Contact a health care provider if:  Your abdominal pain changes or gets worse.  You are not hungry or you lose weight without trying.  You are constipated or have diarrhea for more than 2-3 days.  You have pain when you urinate or have a bowel movement.  Your abdominal pain wakes you up at night.  Your pain gets worse with meals, after eating, or with certain foods.  You are vomiting and cannot keep anything down.  You have a fever.  You have blood in your urine. Get help right away if:  Your pain does not go away as soon as your health care provider told you to expect.  You cannot stop vomiting.  Your pain is only in areas of  the  abdomen, such as the right side or the left lower portion of the abdomen. Pain on the right side could be caused by appendicitis.  You have bloody or black stools, or stools that look like tar.  You have severe pain, cramping, or bloating in your abdomen.  You have signs of dehydration, such as: ? Dark urine, very little urine, or no urine. ? Cracked lips. ? Dry mouth. ? Sunken eyes. ? Sleepiness. ? Weakness.  You have trouble breathing or chest pain. Summary  Often, abdominal pain is not serious and it gets better with no treatment or by being treated at home. However, sometimes abdominal pain is serious.  Watch your condition for any changes.  Take over-the-counter and prescription medicines only as told by your health care provider.  Contact a health care provider if your abdominal pain changes or gets worse.  Get help right away if you have severe pain, cramping, or bloating in your abdomen. This information is not intended to replace advice given to you by your health care provider. Make sure you discuss any questions you have with your health care provider. Document Revised: 07/14/2018 Document Reviewed: 07/14/2018 Elsevier Patient Education  2020 [employer].   Alcohol Withdrawal Syndrome When a person who drinks a lot of alcohol stops drinking, he or she may have unpleasant and serious symptoms. These symptoms are called alcohol withdrawal syndrome. This condition may be mild or severe. It can be life-threatening. It can cause:  Shaking that you cannot control (tremor).  Sweating.  Headache.  Feeling fearful, upset, grouchy, or depressed.  Trouble sleeping (insomnia).  Nightmares.  Fast or uneven heartbeats (palpitations).  Alcohol cravings.  Feeling sick to your stomach (nausea).  Throwing up (vomiting).  Being bothered by light and sounds.  Confusion.  Trouble thinking clearly.  Not being hungry (loss of appetite).  Big changes in mood  (mood swings). If you have all of the following symptoms at the same time, get help right away:  High blood pressure.  Fast heartbeat.  Trouble breathing.  Seizures.  Seeing, hearing, feeling, smelling, or tasting things that are not there (hallucinations). These symptoms are known as delirium tremens (DTs). They must be treated at the hospital right away. Follow these instructions at home:   Take over-the-counter and prescription medicines only as told by your doctor. This includes vitamins.  Do not drink alcohol.  Do not drive until your doctor says that this is safe for you.  Have someone stay with you or be available in case you need help. This should be someone you trust. This person can help you with your symptoms. He or she can also help you to not drink.  Drink enough fluid to keep your pee (urine) pale yellow.  Think about joining a support group or a treatment program to help you stop drinking.  Keep all follow-up visits as told by your doctor. This is important. Contact a doctor if:  Your symptoms get worse.  You cannot eat or drink without throwing up.  You have a hard time not drinking alcohol.  You cannot stop drinking alcohol. Get help right away if:  You have fast or uneven heartbeats.  You have chest pain.  You have trouble breathing.  You have a seizure for the first time.  You see, hear, feel, smell, or taste something that is not there.  You get very confused. Summary  When a person who drinks a lot of alcohol stops drinking, he or she may have  serious symptoms. This is called alcohol withdrawal syndrome.  Delirium tremens (DTs) is a group of life-threatening symptoms. You should get help right away if you have these symptoms.  Think about joining an alcohol support group or a treatment program. This information is not intended to replace advice given to you by your health care provider. Make sure you discuss any questions you have with  your health care provider. Document Revised: 02/15/2017 Document Reviewed: 11/09/2016 Elsevier Patient Education  [place].   Flank Pain, Adult Flank pain is pain in your side. The flank is the area of your side between your upper belly (abdomen) and your back. The pain may occur over a short time (acute), or it may be long-term or come back often (chronic). It may be mild or very bad. Pain in this area can be caused by many different things. Follow these instructions at home:   Drink enough fluid to keep your pee (urine) clear or pale yellow.  Rest as told by your doctor.  Take over-the-counter and prescription medicines only as told by your doctor.  Keep a journal to keep track of: ? What has caused your flank pain. ? What has made it feel better.  Keep all follow-up visits as told by your doctor. This is important. Contact a doctor if:  Medicine does not help your pain.  You have new symptoms.  Your pain gets worse.  You have a fever.  Your symptoms last longer than 2-3 days.  You have trouble peeing.  You are peeing more often than normal. Get help right away if:  You have trouble breathing.  You are short of breath.  Your belly hurts, or it is swollen or red.  You feel sick to your stomach (nauseous).  You throw up (vomit).  You feel like you will pass out, or you do pass out (faint).  You have blood in your pee. Summary  Flank pain is pain in your side. The flank is the area of your side between your upper belly (abdomen) and your back.  Flank pain may occur over a short time (acute), or it may be long-term or come back often (chronic). It may be mild or very bad.  Pain in this area can be caused by many different things.  Contact your doctor if your symptoms get worse or they last longer than 2-3 days. This information is not intended to replace advice given to you by your health care provider. Make sure you discuss any questions you have  with your health care provider. Document Revised: 02/15/2017 Document Reviewed: 06/25/2016 Elsevier Patient Education  [place].

## 2019-09-03 NOTE — TOC Progression Note (Signed)
Transition of Care [facility]) - Progression Note    Patient Details  Name: Victoria Levine MRN: [id number] Date of Birth: [birth year]  [facility] CM/SW Contact  Purcell Mouton, RN Phone Number: 09/03/2019, 10:02 AM  Clinical Narrative:    Pt states she will go home with [facility] and not go to SNF. Pt continued with she has friends that will come be with her. Pt has [facility] at home. Pt will need [facility] and transportation to home. [facility] agreed to follow pt at home. Will need orders for HHRN/PT/OT.    Expected Discharge Plan: Home/Self Care Barriers to Discharge: No Barriers Identified  Expected Discharge Plan and Services Expected Discharge Plan: Home/Self Care       Living arrangements for the past 2 months: Single Family Home                                       Social Determinants of Health (SDOH) Interventions    Readmission Risk Interventions No flowsheet data found.

## 2019-09-03 NOTE — Evaluation (Signed)
Occupational Therapy Evaluation Patient Details Name: Victoria Levine MRN: [id number] DOB: 1950/06/27 Today's Date: 09/03/2019    History of Present Illness 69 year old with history of bipolar disorder, EtOH, HLD, GERD, hypothyroidism, alcohol use initially presented to [facility] ER with complaints of abdominal pain and nausea.  Pt found to have been drinking in excess the days before this admission.  Pt with long history of alcoholism, marijuana use with previous geriatric psych admit.     Clinical Impression   Pt admitted with the above diagnosis and has the deficits listed below. Pt would benefit from cont OT to increase independence with basic adls and safety on her feet before returning home alone. Pt has brother in [place] and neighbors close by but really needs 24 hour assist to ensure safe d/c.  Pt states she falls taking out trash and bringing in groceries and has history of abusing alcohol. Pt may not be safe to live alone for the long run at this point and will definitely need support from OP services for her additions to be able to be safe at home in addition to her mobility safety issues.  Will continue to follow acutely.    Follow Up Recommendations  SNF;Supervision/Assistance - 24 hour    Equipment Recommendations  3 in 1 bedside commode    Recommendations for Other Services       Precautions / Restrictions Precautions Precautions: Fall Precaution Comments: Pt falls when taking out trash and bringing in groceries.  Pt states she falls frequently but when asked how often she falls she states once a year. Restrictions Weight Bearing Restrictions: No      Mobility Bed Mobility Overal bed mobility: Modified Independent             General bed mobility comments: No physical assist needed.  Transfers Overall transfer level: Needs assistance Equipment used: Rolling walker (2 wheeled) Transfers: Sit to/from [facility] Sit to Stand: Min  assist Stand pivot transfers: Min assist       General transfer comment: Pt with loss of balance once up and moving.    Balance Overall balance assessment: Needs assistance Sitting-balance support: Feet supported;No upper extremity supported Sitting balance-Leahy Scale: Good     Standing balance support: Bilateral upper extremity supported Standing balance-Leahy Scale: Poor Standing balance comment: Pt must have outside support of walker to mobilize but is also unsafe with walker.  Walked to bathroom and back with one loss of balance requiring min assist to recover.                           ADL either performed or assessed with clinical judgement   ADL Overall ADL's : Modified independent                                       General ADL Comments: uses shower chair and sometimes a walker.     Vision Baseline Vision/History: No visual deficits Patient Visual Report: No change from baseline Vision Assessment?: No apparent visual deficits     Perception     Praxis Praxis Praxis tested?: Within functional limits    Pertinent Vitals/Pain Pain Assessment: No/denies pain     Hand Dominance Right   Extremity/Trunk Assessment Upper Extremity Assessment Upper Extremity Assessment: Overall WFL for tasks assessed   Lower Extremity Assessment Lower Extremity Assessment: Defer to PT evaluation  Cervical / Trunk Assessment Cervical / Trunk Assessment: Normal   Communication Communication Communication: Other (comment);Expressive difficulties (diction difficulties)   Cognition Arousal/Alertness: Awake/alert Behavior During Therapy: WFL for tasks assessed/performed Overall Cognitive Status: No family/caregiver present to determine baseline cognitive functioning                                 General Comments: decreased safety awareness, decreased knowledge of balance deficits, conflicting answers when asked the same question two  different ways.   General Comments  Therapist has concerns for this pt as she is already falling regularly at home when taking out trash and bringing in groceries.  Chart states she has been binge drinking again and pt does have the ability to drive.  Feel this pt, for the long run, does not need to be living alone.  Pt is not safe at this time from a mobility standpoint to return home alone.  Pt needs 24 hour assist.    Exercises     Shoulder Instructions      Home Living Family/patient expects to be discharged to:: Private residence Living Arrangements: Alone Available Help at Discharge: Friend(s);Available PRN/intermittently Type of Home: House Home Access: Level entry     Home Layout: One level     Bathroom Shower/Tub: Occupational psychologist: Standard     Home Equipment: Environmental consultant - 4 wheels;Shower seat   Additional Comments: neighbors can assist but pt states they are not always available.      Prior Functioning/Environment Level of Independence: Independent with assistive device(s)        Comments: Pt states she only uses a rollator to walk outside.  She does not take it to store or in community.        OT Problem List: Impaired balance (sitting and/or standing);Decreased cognition;Decreased safety awareness;Decreased knowledge of use of DME or AE      OT Treatment/Interventions: Self-care/ADL training;DME and/or AE instruction;Therapeutic activities;Balance training    OT Goals(Current goals can be found in the care plan section) Acute Rehab OT Goals OT Goal Formulation: With patient Time For Goal Achievement: 09/17/19 Potential to Achieve Goals: Fair ADL Goals Pt Will Perform Grooming: with modified independence;standing Pt Will Perform Lower Body Bathing: with modified independence;sit to/from stand Pt Will Perform Lower Body Dressing: with supervision;sit to/from stand Pt Will Perform Tub/Shower Transfer: Shower transfer;with  supervision;ambulating;rolling walker;shower seat Additional ADL Goal #1: Pt will walk to bathroom with walker and complete all toileting tasks with supervision.  OT Frequency: Min 2X/week   Barriers to D/C: Decreased caregiver support  Pt lives alone.  Brother lives in [place].       Co-evaluation              AM-PAC OT "6 Clicks" Daily Activity     Outcome Measure Help from another person eating meals?: None Help from another person taking care of personal grooming?: A Little Help from another person toileting, which includes using toliet, bedpan, or urinal?: A Little Help from another person bathing (including washing, rinsing, drying)?: A Little Help from another person to put on and taking off regular upper body clothing?: None Help from another person to put on and taking off regular lower body clothing?: A Little 6 Click Score: 20   End of Session Equipment Utilized During Treatment: Rolling walker Nurse Communication: Mobility status  Activity Tolerance: Patient tolerated treatment well Patient left: in chair;with call bell/phone within  reach;with chair alarm set  OT Visit Diagnosis: Unsteadiness on feet (R26.81);Other abnormalities of gait and mobility (R26.89);Other symptoms and signs involving cognitive function                Time: 2778-2423 OT Time Calculation (min): 24 min Charges:  OT General Charges $OT Visit: 1 Visit OT Evaluation $OT Eval Low Complexity: 1 Low OT Treatments $Self Care/Home Management : 8-22 mins  Glenford Peers 09/03/2019, 7:48 AM

## 2019-09-04 LAB — CBC
HCT: 30.6 % — ABNORMAL LOW (ref 36.0–46.0)
Hemoglobin: 9.1 g/dL — ABNORMAL LOW (ref 12.0–15.0)
MCH: 22.4 pg — ABNORMAL LOW (ref 26.0–34.0)
MCHC: 29.7 g/dL — ABNORMAL LOW (ref 30.0–36.0)
MCV: 75.2 fL — ABNORMAL LOW (ref 80.0–100.0)
Platelets: 271 10*3/uL (ref 150–400)
RBC: 4.07 MIL/uL (ref 3.87–5.11)
RDW: 17.9 % — ABNORMAL HIGH (ref 11.5–15.5)
WBC: 9.8 10*3/uL (ref 4.0–10.5)
nRBC: 0 % (ref 0.0–0.2)

## 2019-09-04 LAB — MAGNESIUM: Magnesium: 1.8 mg/dL (ref 1.7–2.4)

## 2019-09-04 LAB — GLUCOSE, CAPILLARY
Glucose-Capillary: 126 mg/dL — ABNORMAL HIGH (ref 70–99)
Glucose-Capillary: 96 mg/dL (ref 70–99)

## 2019-09-04 MED ORDER — ONDANSETRON 4 MG PO TBDP: 4.0000 mg | ORAL_TABLET | Freq: Three times a day (TID) | ORAL | 0 refills | Status: DC | PRN

## 2019-09-04 NOTE — Progress Notes (Signed)
Pt present with unsteady gait, pt refused to go to SNF/Rehab facility. She wants to go home, "stating once I am in the house I will be fine. I have a walker at home and will be able to manage. EMS arrived to transport pt to home. Brother notified earlier that pt is discharged. Pt and brother reports a friend will come by later to assist pt. Pt is alert and oriented and make her decisions concerning her care. MD made ware by PT. SRP, RN

## 2019-09-04 NOTE — TOC Progression Note (Signed)
Transition of Care [facility]) - Progression Note    Patient Details  Name: Victoria Levine MRN: [id number] Date of Birth: 18-Apr-1950  Transition of Care [facility]) CM/SW Contact  Purcell Mouton, RN Phone Number: 09/04/2019, 11:30 AM  Clinical Narrative:    Spoke with pt again concerning discharge plans and asked if pt wanted to go to SNF. Pt states that she is going home with [facility] and friends. Pt states that she will need help getting into her home. [facility] was called for transportation.    Expected Discharge Plan: Home/Self Care Barriers to Discharge: No Barriers Identified  Expected Discharge Plan and Services Expected Discharge Plan: Home/Self Care       Living arrangements for the past 2 months: Single Family Home Expected Discharge Date: 09/04/19                                     Social Determinants of Health (SDOH) Interventions    Readmission Risk Interventions No flowsheet data found.

## 2019-09-04 NOTE — Progress Notes (Signed)
Patient developed some nausea yesterday after her discharge therefore she was observed overnight and given antiemetics.  She did well no other complaints this morning.  She is ready to go.  Vital signs remained stable, physical exam is pretty much unremarkable.  Discharge patient in stable condition.  Discussed with RN staff  Gerlean Ren MD [facility]

## 2019-09-04 NOTE — Progress Notes (Signed)
Spoke with Loney Loh brother made him aware pt is discharged today. Hospital transportation service arranged for patient. SRP, RN

## 2019-09-04 NOTE — Progress Notes (Signed)
Occupational Therapy Treatment Patient Details Name: Victoria Levine MRN: [id number] DOB: [birth year] Today's Date: 09/04/2019    History of present illness 69 year old with history of bipolar disorder, EtOH, HLD, GERD, hypothyroidism, alcohol use initially presented to [facility] ER with complaints of abdominal pain and nausea.  Pt found to have been drinking in excess the days before this admission.  Pt with long history of alcoholism, marijuana use with previous geriatric psych admit.     OT comments  Pt with LOB x 2 that would have led to falls without assist of therapist to correct. Spoke to pt about safety concerns with her returning home alone. Pt was agreeable to ST rehab in SNF at end of session, RN and MD notified.  Follow Up Recommendations  SNF;Supervision/Assistance - 24 hour    Equipment Recommendations  3 in 1 bedside commode    Recommendations for Other Services      Precautions / Restrictions Precautions Precautions: Fall Precaution Comments: pt had significant LOB x 2 during session       Mobility Bed Mobility Overal bed mobility: Modified Independent                Transfers Overall transfer level: Needs assistance Equipment used: Rolling walker (2 wheeled) Transfers: Sit to/from Stand Sit to Stand: Min guard         General transfer comment: pt with poor dynamic standing balance and LOB requiring moderate assistance to correct    Balance Overall balance assessment: Needs assistance   Sitting balance-Leahy Scale: Good     Standing balance support: Bilateral upper extremity supported Standing balance-Leahy Scale: Poor Standing balance comment: requires external support and use of RW                            ADL either performed or assessed with clinical judgement   ADL Overall ADL's : Needs assistance/impaired                         Toilet Transfer: Moderate assistance;Ambulation;RW           Functional  mobility during ADLs: Moderate assistance;Rolling walker       Vision       Perception     Praxis      Cognition Arousal/Alertness: Awake/alert Behavior During Therapy: Impulsive Overall Cognitive Status: Impaired/Different from baseline Area of Impairment: Safety/judgement;Problem solving;Attention                   Current Attention Level: Sustained     Safety/Judgement: Decreased awareness of deficits;Decreased awareness of safety   Problem Solving: Slow processing;Difficulty sequencing General Comments: Lengthy conversation with pt about safety concerns once she returns home, agreeable to SNF at end of session with pt stating, "This is going to be a disaster," when speaking about going home.        Exercises     Shoulder Instructions       General Comments      Pertinent Vitals/ Pain       Pain Assessment: Faces Faces Pain Scale: No hurt  Home Living                                          Prior Functioning/Environment              Frequency  Min 2X/week        Progress Toward Goals  OT Goals(current goals can now be found in the care plan section)  Progress towards OT goals: Progressing toward goals  Acute Rehab OT Goals Patient Stated Goal: to go home OT Goal Formulation: With patient Time For Goal Achievement: 09/17/19 Potential to Achieve Goals: [place] Discharge plan remains appropriate    Co-evaluation                 AM-PAC OT "6 Clicks" Daily Activity     Outcome Measure   Help from another person eating meals?: None Help from another person taking care of personal grooming?: A Lot Help from another person toileting, which includes using toliet, bedpan, or urinal?: A Lot Help from another person bathing (including washing, rinsing, drying)?: A Lot Help from another person to put on and taking off regular upper body clothing?: A Little Help from another person to put on and taking off  regular lower body clothing?: A Lot 6 Click Score: 15    End of Session Equipment Utilized During Treatment: Gait belt;Rolling walker  OT Visit Diagnosis: Unsteadiness on feet (R26.81);Other abnormalities of gait and mobility (R26.89);Other symptoms and signs involving cognitive function   Activity Tolerance Patient tolerated treatment well   Patient Left in bed;with call bell/phone within reach;with bed alarm set   Nurse Communication Mobility status (pt agreeable to snf)        Time: 1040-1103 OT Time Calculation (min): 23 min  Charges: OT General Charges $OT Visit: 1 Visit OT Treatments $Self Care/Home Management : 8-22 mins  Nestor Lewandowsky, OTR/L Acute Rehabilitation Services Pager: [phone] Office: [phone]   Malka So 09/04/2019, 11:37 AM

## 2019-09-04 NOTE — Progress Notes (Signed)
Physical Therapy Treatment Patient Details Name: Victoria Levine MRN: [id number] DOB: 05-19-1950 Today's Date: 09/04/2019    History of Present Illness 69 year old with history of bipolar disorder, EtOH, HLD, GERD, hypothyroidism, alcohol use initially presented to [facility] ER with complaints of abdominal pain and nausea.  Pt found to have been drinking in excess the days before this admission.  Pt with long history of alcoholism, marijuana use with previous geriatric psych admit.      PT Comments    Pt with LOB twice during gait training, very impulsive, attempting to reach for objects and around RW, reaching out for therapist, demonstrating forward flexed trunk and poor dynamic balance. Therapist prevented 2 LOB instances to prevent pt from falling into floor. Significant education on safety/impulsive concerns regarding deficits and lack of family support at home, encouraged pt on SNF for ST strengthening and pt agreeable at end of conversation; MD and RN notified. Patient will benefit from continued physical therapy in hospital and recommendations below to increase strength, balance, endurance for safe ADLs and gait.   Follow Up Recommendations  Supervision for mobility/OOB;Supervision/Assistance - 24 hour;SNF     Equipment Recommendations  None recommended by PT (TBD at next venue)    Recommendations for Other Services       Precautions / Restrictions Precautions Precautions: Fall Precaution Comments: significant LOB requiring A to recover Restrictions Weight Bearing Restrictions: No    Mobility  Bed Mobility Overal bed mobility: Modified Independent  General bed mobility comments: no physical assist  Transfers Overall transfer level: Needs assistance Equipment used: Rolling walker (2 wheeled) Transfers: Sit to/from [facility] Sit to Stand: Min guard Stand pivot transfers: Min guard  General transfer comment: very impulsive, stood before RW at  appropriate distance, attempting to take steps prior to full bil knee extension, reaching for therapist and RW with outstretched arms;  pt standpivot from chair to bed via drawl/diving into bed then crawling around in bed to achieve supine, significant education on poor safety awareness with stand pivot transfer and pt verbalizes appropriate transfer due to close proximity of bed to chair  Ambulation/Gait Ambulation/Gait assistance: Min assist;Mod assist Gait Distance (Feet): 20 Feet Assistive device: Rolling walker (2 wheeled) Gait Pattern/deviations: Step-through pattern;Trunk flexed     General Gait Details: ambulates around room with RW and occasionally reaching around RW to furniture to steady self and impulsively trying to get to things around the room, LOB x2 instances requiring mod/max A to prevent fall onto floor   Stairs             Wheelchair Mobility    Modified Rankin (Stroke Patients Only)       Balance Overall balance assessment: Needs assistance   Sitting balance-Leahy Scale: Good     Standing balance support: Bilateral upper extremity supported Standing balance-Leahy Scale: Poor Standing balance comment: reliant on RW and occaisonal A             Cognition Arousal/Alertness: Awake/alert Behavior During Therapy: Impulsive Overall Cognitive Status: No family/caregiver present to determine baseline cognitive functioning Area of Impairment: Safety/judgement;Problem solving;Attention          Current Attention Level: Sustained     Safety/Judgement: Decreased awareness of deficits;Decreased awareness of safety   Problem Solving: Slow processing;Difficulty sequencing General Comments: Reviewed impulsive behavior concerns and educated pt on mentally developing plan before rising to mobilize about the room to improve safety; pt verbalized understanding. Extensive conversation educating pt on deficits and safety concerns at home, pt  reports "this is  going to be a disaster" and agreeable to SNF at [facility].      Exercises      General Comments        Pertinent Vitals/Pain Pain Assessment: No/denies pain Faces Pain Scale: No hurt    Home Living                      Prior Function            PT Goals (current goals can now be found in the care plan section) Acute Rehab PT Goals Patient Stated Goal: to go home PT Goal Formulation: With patient Time For Goal Achievement: 09/16/19 Potential to Achieve Goals: Fair Progress towards PT goals: Progressing toward goals    Frequency    Min 3X/week      PT Plan Current plan remains appropriate    Co-evaluation              AM-PAC PT "6 Clicks" Mobility   Outcome Measure  Help needed turning from your back to your side while in a flat bed without using bedrails?: A Little Help needed moving from lying on your back to sitting on the side of a flat bed without using bedrails?: A Little Help needed moving to and from a bed to a chair (including a wheelchair)?: A Lot Help needed standing up from a chair using your arms (e.g., wheelchair or bedside chair)?: A Lot Help needed to walk in hospital room?: A Lot Help needed climbing 3-5 steps with a railing? : A Lot 6 Click Score: 14    End of Session Equipment Utilized During Treatment: Gait belt Activity Tolerance: Patient tolerated treatment well Patient left: in bed;with call bell/phone within reach;with bed alarm set Nurse Communication: Mobility status;Other (comment) (safety concerns to d/c home) PT Visit Diagnosis: Unsteadiness on feet (R26.81);Difficulty in walking, not elsewhere classified (R26.2);History of falling (Z91.81);Repeated falls (R29.6)     Time: 1040-1103 PT Time Calculation (min) (ACUTE ONLY): 23 min  Charges:  $Gait Training: 8-22 mins                      Tori Maram Bently PT, DPT 09/04/19, 12:18 PM

## 2019-09-25 ENCOUNTER — Other Ambulatory Visit: Payer: Self-pay

## 2019-09-25 ENCOUNTER — Emergency Department (HOSPITAL_COMMUNITY)
Admission: EM | Admit: 2019-09-25 | Discharge: 2019-09-25 | Disposition: A | Discharge status: Home / Self Care | Payer: Medicare HMO | Attending: Emergency Medicine | Admitting: Emergency Medicine

## 2019-09-25 ENCOUNTER — Emergency Department (HOSPITAL_COMMUNITY): Payer: Medicare HMO

## 2019-09-25 ENCOUNTER — Encounter (HOSPITAL_COMMUNITY): Payer: Self-pay

## 2019-09-25 DIAGNOSIS — S060X1A Concussion with loss of consciousness of 30 minutes or less, initial encounter: Secondary | ICD-10-CM | POA: Diagnosis not present

## 2019-09-25 DIAGNOSIS — W010XXA Fall on same level from slipping, tripping and stumbling without subsequent striking against object, initial encounter: Secondary | ICD-10-CM | POA: Insufficient documentation

## 2019-09-25 DIAGNOSIS — S0990XA Unspecified injury of head, initial encounter: Secondary | ICD-10-CM | POA: Diagnosis present

## 2019-09-25 DIAGNOSIS — Y939 Activity, unspecified: Secondary | ICD-10-CM | POA: Insufficient documentation

## 2019-09-25 DIAGNOSIS — Z79899 Other long term (current) drug therapy: Secondary | ICD-10-CM | POA: Insufficient documentation

## 2019-09-25 DIAGNOSIS — Y999 Unspecified external cause status: Secondary | ICD-10-CM | POA: Diagnosis not present

## 2019-09-25 DIAGNOSIS — J45909 Unspecified asthma, uncomplicated: Secondary | ICD-10-CM | POA: Insufficient documentation

## 2019-09-25 DIAGNOSIS — Y929 Unspecified place or not applicable: Secondary | ICD-10-CM | POA: Diagnosis not present

## 2019-09-25 DIAGNOSIS — E039 Hypothyroidism, unspecified: Secondary | ICD-10-CM | POA: Insufficient documentation

## 2019-09-25 DIAGNOSIS — S0083XA Contusion of other part of head, initial encounter: Secondary | ICD-10-CM | POA: Insufficient documentation

## 2019-09-25 NOTE — ED Triage Notes (Signed)
Patient states she was putting her pajamas on 2 days ago and had both legs in one leg hole and fell. Patient hit her head on the floor. patient has a hematoma to the right side of her head and bruising to the face and eyes.  Patient states she had LOC for approx an hour. Patient denies any blurred vision or taking blood thinners.

## 2019-09-25 NOTE — Discharge Instructions (Signed)
Return if any problems.

## 2019-09-25 NOTE — ED Provider Notes (Signed)
[facility] DEPT Provider Note   CSN: [id number] Arrival date & time: 09/25/19  1220     History Chief Complaint  Patient presents with  . Fall  . Eye Injury    Victoria Levine is a 69 y.o. female.  The history is provided by the patient. No language interpreter was used.  Fall This is a new problem. The current episode started more than 2 days ago. The problem occurs constantly. The problem has been gradually worsening. Nothing aggravates the symptoms. Nothing relieves the symptoms. She has tried nothing for the symptoms.  Eye Injury  Pt reports she put 2 legs in one side of pants and slipped and fell      Past Medical History:  Diagnosis Date  . Anemia    low iron  . Anxiety   . Asthma   . Bipolar disorder ([place])   . Chronic back pain   . Compression fracture of L2 lumbar vertebra (HCC)   . DDD (degenerative disc disease)   . Depression   . Dyspnea   . GERD (gastroesophageal reflux disease)   . H/O alcohol abuse    28 years sober  . H/O suicide attempt   . Herpes   . History of hiatal hernia   . Hypothyroidism   . Migraines   . Osteoporosis 11/2018   T score of -2.6  . Pneumonia   . S/P adenoidectomy   . S/P tonsillectomy     Patient Active Problem List   Diagnosis Date Noted  . Intractable nausea and vomiting 08/31/2019  . Epigastric pain 08/31/2019  . Alcohol abuse 08/31/2019  . Microcytic anemia 08/31/2019  . Closed compression fracture of L2 vertebra ([place]) 12/11/2017  . Pain 07/26/2015  . Anxiety state 06/28/2015  . Benzodiazepine dependence ([place]) 10/13/2014  . Opioid abuse with opioid-induced mood disorder ([place]) 10/13/2014  . Bipolar 1 disorder, depressed ([place]) 10/12/2014  . Spondylolisthesis of lumbar region 07/28/2014  . Osteoporosis, unspecified 11/17/2013  . Migraines   . Herpes   . Hypothyroidism 08/13/2008  . HYPERCHOLESTEROLEMIA 08/13/2008  . Bipolar disorder ([place]) 08/13/2008  . CHRONIC OBSTRUCTIVE  ASTHMA UNSPECIFIED 08/13/2008  . GERD (gastroesophageal reflux disease) 08/13/2008  . HIATAL HERNIA 08/13/2008  . ALLERGY 08/13/2008    Past Surgical History:  Procedure Laterality Date  . CESAREAN SECTION    . COLONOSCOPY     as a teenager  . Compression fracture spine    . DILATION AND CURETTAGE OF UTERUS    . EYE SURGERY Bilateral    CATARACTS REMOVED, NO LENS PLACED  . HYSTEROSCOPY    . KYPHOPLASTY N/A 12/19/2016   Procedure: THORACIC 12 KYPHOPLASTY; REQUEST 60 MINS AND FLIP ROOM;  Surgeon: Phylliss Bob, MD;  Location: [facility];  Service: Orthopedics;  Laterality: N/A;  THORACIC 12 KYPHOPLASTY; REQUEST 60 MINS AND FLIP ROOM  . KYPHOPLASTY N/A 12/11/2017   Procedure: LUMBAR 2 KYPHOPLASTY;  Surgeon: Phylliss Bob, MD;  Location: [facility];  Service: Orthopedics;  Laterality: N/A;  . LUMBAR FUSION    . TONSILLECTOMY       OB History    Gravida  3   Para  1   Term  1   Preterm      AB  2   Living  1     SAB      TAB      Ectopic      Multiple      Live Births  Family History  Adopted: Yes    Social History   Tobacco Use  . Smoking status: Never Smoker  . Smokeless tobacco: Never Used  Vaping Use  . Vaping Use: Never used  Substance Use Topics  . Alcohol use: No    Alcohol/week: 0.0 standard drinks    Comment: hx of heavy alcohol use - 28 years sober  . Drug use: Yes    Types: Benzodiazepines    Comment: hx of cocaine use in her 20's    Home Medications Prior to Admission medications   Medication Sig Start Date End Date Taking? Authorizing Provider  albuterol (PROVENTIL HFA;VENTOLIN HFA) 108 (90 Base) MCG/ACT inhaler Inhale 2 puffs into the lungs every 6 (six) hours as needed for wheezing or shortness of breath.    [provider]  buPROPion (WELLBUTRIN XL) 150 MG 24 hr tablet Take 300 mg by mouth daily.  10/01/16   [provider]  calcium citrate-vitamin D (CALCIUM + D) 315-200 MG-UNIT per tablet Take 1 tablet by  mouth daily. For bone health 10/18/14   Lindell Spar I, NP  chlordiazePOXIDE (LIBRIUM) 25 MG capsule 50mg  PO TID x 1D, then 25-50mg  PO BID X 1D, then 25-50mg  PO QD X 1D 05/08/19   Petrucelli, Samantha R, PA-C  cyclobenzaprine (FLEXERIL) 10 MG tablet Take 10 mg by mouth 3 (three) times daily as needed for muscle spasms.     [provider]  denosumab (PROLIA) 60 MG/ML SOLN injection Inject 60 mg into the skin every 6 (six) months. Administer in upper arm, thigh, or abdomen  NEXT IS DUE 03-2017 Patient not taking: Reported on 11/19/2018 03/05/17   [name], NP  EPINEPHrine 0.3 mg/0.3 mL IJ SOAJ injection Inject 0.3 mg into the skin as needed for anaphylaxis. 04/10/19   [provider]  famotidine (PEPCID) 20 MG tablet Take 1 tablet (20 mg total) by mouth at bedtime. 09/03/19 10/03/19  Damita Lack, MD  ferrous sulfate 325 (65 FE) MG tablet Take 1 tablet (325 mg total) by mouth 2 (two) times daily with a meal. 09/03/19 10/03/19  Damita Lack, MD  FLOVENT [facility] 110 MCG/ACT inhaler  07/15/19   [provider]  FLUoxetine (PROZAC) 20 MG capsule Take 20 mg by mouth 2 (two) times daily.     [provider]  gabapentin (NEURONTIN) 600 MG tablet Take 600 mg by mouth 3 (three) times daily.  11/05/16   [provider]  levothyroxine (SYNTHROID, LEVOTHROID) 112 MCG tablet Take 112 mcg by mouth daily before breakfast.  09/04/17   [provider]  methocarbamol (ROBAXIN) 500 MG tablet Take 500 mg by mouth 2 (two) times daily. 03/11/19   [provider]  Multiple Vitamin (MULTIVITAMIN) tablet Take 1 tablet by mouth daily. For low vitamin 10/18/14   Lindell Spar I, NP  naproxen sodium (ALEVE) 220 MG tablet Take 220-440 mg by mouth 2 (two) times daily as needed (for pain).    [provider]  ondansetron (ZOFRAN ODT) 4 MG disintegrating tablet Take 1 tablet (4 mg total) by mouth every 8 (eight) hours as needed for nausea or vomiting. 05/08/19    Petrucelli, Samantha R, PA-C  ondansetron (ZOFRAN ODT) 4 MG disintegrating tablet Take 1 tablet (4 mg total) by mouth every 8 (eight) hours as needed for nausea or vomiting. 09/04/19   Amin, Jeanella Flattery, MD  ondansetron (ZOFRAN) 8 MG tablet Take 8 mg by mouth every 8 (eight) hours as needed for nausea or vomiting (  Migraine Head Ache symptoms).    [provider]  oxcarbazepine (TRILEPTAL) 600 MG tablet Take 600 mg by mouth 2 (two) times daily.     [provider]  OxyCODONE ER (XTAMPZA ER) 13.5 MG C12A Take 13.5 mg by mouth 2 (two) times daily. 11/22/16   [provider]  Oxycodone HCl 10 MG TABS Take 10 mg by mouth daily as needed (for breakthrough pain).  09/16/17   [provider]  pantoprazole (PROTONIX) 40 MG tablet Take 1 tablet (40 mg total) by mouth 2 (two) times daily before a meal. 09/03/19   Amin, Jeanella Flattery, MD  polyethylene glycol (MIRALAX / GLYCOLAX) 17 g packet Take 17 g by mouth daily as needed for moderate constipation or severe constipation. 09/03/19   Amin, Jeanella Flattery, MD  QUEtiapine (SEROQUEL) 100 MG tablet Take 100-200 mg by mouth at bedtime.  10/12/16   [provider]  rizatriptan (MAXALT-MLT) 10 MG disintegrating tablet Take 10 mg by mouth as needed for migraine.  11/08/14   [provider]  senna-docusate (SENOKOT-S) 8.6-50 MG tablet Take 2 tablets by mouth 2 (two) times daily. 09/03/19 10/03/19  Amin, Jeanella Flattery, MD  sucralfate (CARAFATE) 1 GM/10ML suspension Take 10 mLs (1 g total) by mouth 4 (four) times daily -  with meals and at bedtime. 05/08/19   Petrucelli, Glynda Jaeger, PA-C  valACYclovir (VALTREX) 500 MG tablet Take one tablet twice daily 3-5 days then daily as needed. 05/01/17   [name], NP    Allergies    Prednisone, Lithium carbonate, and Sulfa antibiotics  Review of Systems   Review of Systems  All other systems reviewed and are negative.   Physical Exam Updated Vital Signs BP 130/81   Pulse 72    Temp 98.8 F (37.1 C) (Oral)   Resp 16   Ht 5\' 6"  (1.676 m)   Wt 70.8 kg   SpO2 94%   BMI 25.18 kg/m   Physical Exam Vitals and nursing note reviewed.  Constitutional:      Appearance: She is well-developed.  HENT:     Head: Normocephalic.     Comments: Bruised above and below both eyes.  2cm swollen knot right forehead,      Right Ear: External ear normal.     Left Ear: External ear normal.     Mouth/Throat:     Mouth: Mucous membranes are moist.  Eyes:     Extraocular Movements: Extraocular movements intact.     Pupils: Pupils are equal, round, and reactive to light.  Pulmonary:     Effort: Pulmonary effort is normal.  Abdominal:     General: There is no distension.  Musculoskeletal:        General: Normal range of motion.     Cervical back: Normal range of motion.  Skin:    General: Skin is warm.  Neurological:     General: No focal deficit present.     Mental Status: She is alert and oriented to person, place, and time.  Psychiatric:        Mood and Affect: Mood normal.     ED Results / Procedures / Treatments   Labs (all labs ordered are listed, but only abnormal results are displayed) Labs Reviewed - No data to display  EKG None  Radiology CT Head Wo Contrast  Result Date: 09/25/2019 CLINICAL DATA:  [place] 3 days ago, hit head, right frontal scalp hematoma, facial bruising, loss of consciousness EXAM: CT HEAD WITHOUT CONTRAST  TECHNIQUE: Contiguous axial images were obtained from the base of the skull through the vertex without intravenous contrast. COMPARISON:  08/31/2019 FINDINGS: Brain: No acute infarct or hemorrhage. Lateral ventricles and midline structures are unremarkable. No acute extra-axial fluid collections. No mass effect. Vascular: No hyperdense vessel or unexpected calcification. Skull: Small right frontal scalp hematoma. No underlying fractures. Remainder of the calvarium is unremarkable. Sinuses/Orbits: No acute finding. Other: None. IMPRESSION: 1.  Small right frontal scalp hematoma. 2. No acute intracranial process. Electronically Signed   By: Randa Ngo M.D.   On: 09/25/2019 15:15    Procedures Procedures (including critical care time)  Medications Ordered in ED Medications - No data to display  ED Course  I have reviewed the triage vital signs and the nursing notes.  Pertinent labs & imaging results that were available during my care of the patient were reviewed by me and considered in my medical decision making (see chart for details).    MDM Rules/Calculators/A&P                          MDM:  Pt counseled on concussions.  Tylenol for pain Final Clinical Impression(s) / ED Diagnoses Final diagnoses:  Contusion of face, initial encounter  Concussion with loss of consciousness of 30 minutes or less, initial encounter    Rx / DC Orders ED Discharge Orders    None    An After Visit Summary was printed and given to the patient.    Sidney Ace 09/25/19 1548    Carmin Muskrat, MD 09/28/19 2317

## 2019-09-25 NOTE — ED Notes (Signed)
Pt ambulatory to room.

## 2019-10-27 ENCOUNTER — Other Ambulatory Visit: Payer: Self-pay | Admitting: Gastroenterology

## 2019-10-27 DIAGNOSIS — K449 Diaphragmatic hernia without obstruction or gangrene: Secondary | ICD-10-CM

## 2019-10-27 DIAGNOSIS — K2100 Gastro-esophageal reflux disease with esophagitis, without bleeding: Secondary | ICD-10-CM

## 2019-10-27 DIAGNOSIS — D649 Anemia, unspecified: Secondary | ICD-10-CM

## 2019-10-28 ENCOUNTER — Ambulatory Visit
Admission: RE | Admit: 2019-10-28 | Discharge: 2019-10-28 | Disposition: A | Discharge status: Home / Self Care | Payer: Medicare HMO | Source: Ambulatory Visit | Attending: Gastroenterology | Admitting: Gastroenterology

## 2019-10-28 ENCOUNTER — Ambulatory Visit: Payer: Medicare HMO | Admitting: Physical Therapy

## 2019-10-28 DIAGNOSIS — K2100 Gastro-esophageal reflux disease with esophagitis, without bleeding: Secondary | ICD-10-CM

## 2019-10-28 DIAGNOSIS — K449 Diaphragmatic hernia without obstruction or gangrene: Secondary | ICD-10-CM

## 2019-10-30 ENCOUNTER — Encounter: Payer: Self-pay | Admitting: Physical Therapy

## 2019-10-30 ENCOUNTER — Other Ambulatory Visit: Payer: Self-pay

## 2019-10-30 ENCOUNTER — Ambulatory Visit: Payer: Medicare HMO | Attending: Family Medicine | Admitting: Physical Therapy

## 2019-10-30 DIAGNOSIS — M6281 Muscle weakness (generalized): Secondary | ICD-10-CM | POA: Insufficient documentation

## 2019-10-30 DIAGNOSIS — M545 Low back pain: Secondary | ICD-10-CM | POA: Diagnosis present

## 2019-10-30 DIAGNOSIS — G8929 Other chronic pain: Secondary | ICD-10-CM | POA: Diagnosis present

## 2019-10-30 NOTE — Therapy (Signed)
[facility] [address] [place], [place], [address] Phone: [phone]   Fax:  [phone]  Physical Therapy Evaluation  Patient Details  Name: Victoria Levine MRN: [id number] Date of Birth: 04/22/1950 Referring Provider (PT): [name], MD   Encounter Date: 10/30/2019   PT End of Session - 10/30/19 1149    Visit Number 1    Date for PT Re-Evaluation 12/25/19    Authorization Type [employer] - submitting for auth, KX at visit 15    Progress Note Due on Visit 10    PT Start Time 0850    PT Stop Time 0930    PT Time Calculation (min) 40 min    Activity Tolerance Patient tolerated treatment well;Patient limited by fatigue;Patient limited by pain    Behavior During Therapy [facility] for tasks assessed/performed           Past Medical History:  Diagnosis Date  . Anemia    low iron  . Anxiety   . Asthma   . Bipolar disorder ([place])   . Chronic back pain   . Compression fracture of L2 lumbar vertebra (HCC)   . DDD (degenerative disc disease)   . Depression   . Dyspnea   . GERD (gastroesophageal reflux disease)   . H/O alcohol abuse    28 years sober  . H/O suicide attempt   . Herpes   . History of hiatal hernia   . Hypothyroidism   . Migraines   . Osteoporosis 11/2018   T score of -2.6  . Pneumonia   . S/P adenoidectomy   . S/P tonsillectomy     Past Surgical History:  Procedure Laterality Date  . CESAREAN SECTION    . COLONOSCOPY     as a teenager  . Compression fracture spine    . DILATION AND CURETTAGE OF UTERUS    . EYE SURGERY Bilateral    CATARACTS REMOVED, NO LENS PLACED  . HYSTEROSCOPY    . KYPHOPLASTY N/A 12/19/2016   Procedure: THORACIC 12 KYPHOPLASTY; REQUEST 60 MINS AND FLIP ROOM;  Surgeon: Phylliss Bob, MD;  Location: [facility];  Service: Orthopedics;  Laterality: N/A;  THORACIC 12 KYPHOPLASTY; REQUEST 60 MINS AND FLIP ROOM  . KYPHOPLASTY N/A 12/11/2017   Procedure: LUMBAR 2  KYPHOPLASTY;  Surgeon: Phylliss Bob, MD;  Location: [facility];  Service: Orthopedics;  Laterality: N/A;  . LUMBAR FUSION    . TONSILLECTOMY      There were no vitals filed for this visit.    Subjective Assessment - 10/30/19 0853    Subjective Pt had a fall after accidentally putting both legs in one pant leg of PJs.  In early July.  Had head injury with LOC and facial bruising.  I feel generally unstable and need to work on balance.    Pertinent History PMH: OSTEOPOROSIS, + alcohol use, bipolar, HLD, GERD    Limitations Standing    How long can you stand comfortably? 1-2 min, feel unstable/woozy    Diagnostic tests 2019 xrays and MRI: T12 and L2 compression fractures with kyphoplasty, PLIF L3-L5, L5/S1 severe facet degeneration.    Patient Stated Goals work on balance (standing), tolerate short grocery store trips without having to lay down before unloading    Currently in Pain? Yes    Pain Score 6     Pain Location Back    Pain Orientation Lower    Pain Onset More than a month ago    Pain Frequency  Constant    Aggravating Factors  standing    Pain Relieving Factors laying down    Effect of Pain on Daily Activities grocery shopping, doesn't cook or clean anymore due to pain              [facility] PT Assessment - 10/30/19 0001      Assessment   Medical Diagnosis W19.[name] (ICD-10-CM) - Unspecified fall, initial encounter    Referring Provider (PT) [name], MD    Onset Date/Surgical Date --   years ago   Prior Therapy yes for back      Precautions   Precautions Fall;Back    Precaution Comments osteoporosis      Restrictions   Weight Bearing Restrictions No      Balance Screen   Has the patient fallen in the past 6 months Yes    How many times? 1    Has the patient had a decrease in activity level because of a fear of falling?  Yes    Is the patient reluctant to leave their home because of a fear of falling?  Yes      [place]  residence    Living Arrangements Alone    Type of [place] Access Level entry    [place] One level;Able to live on main level with bedroom/bathroom    [place] - 4 wheels;Cane - single point;Grab bars - toilet;Grab bars - tub/shower;Toilet riser;Shower seat      Prior Function   Level of Independence Independent with basic ADLs    Vocation Retired;On disability    Leisure sewing, reading      Cognition   Overall Cognitive Status Within Functional Limits for tasks assessed      Observation/Other Assessments   Skin Integrity midline lumbar surgical scar present with good mobility      Functional Tests   Functional tests Single leg stance;Sit to Stand      Single Leg Stance   Comments unable to balance on Rt/Lt LE       Sit to Stand   Comments uses bil UEs to stand and sit, narrow BOS      Posture/Postural Control   Posture/Postural Control Postural limitations    Postural Limitations Flexed trunk;Increased thoracic kyphosis      ROM / Strength   AROM / PROM / Strength AROM;Strength      AROM   Overall AROM Comments limited trunk ROM by pain all planes, 50% motion      Strength   Overall Strength Comments bil LEs 4-/5 throughout, trunk 4-/5 all planes      Balance   Balance Assessed Yes      Standardized Balance Assessment   Standardized Balance Assessment Berg Balance Test;Five Times Sit to Stand    Five times sit to stand comments  19 sec, use of bil UEs, 1 minor balance loss on rep 3      Berg Balance Test   Sit to Stand Able to stand  independently using hands    Standing Unsupported Able to stand 30 seconds unsupported    Sitting with Back Unsupported but Feet Supported on Floor or Stool Able to sit 2 minutes under supervision    Stand to Sit Controls descent by using hands    Transfers Able to transfer safely, minor use of hands    Standing Unsupported with Eyes Closed Able to stand 10 seconds with supervision  Standing Unsupported  with Feet Together Needs help to attain position and unable to hold for 15 seconds    From Standing, Reach Forward with Outstretched Arm Can reach forward >12 cm safely (5")    From Standing Position, Pick up Object from Floor Able to pick up shoe, needs supervision    From Standing Position, Turn to Look Behind Over each Shoulder Looks behind one side only/other side shows less weight shift    Turn 360 Degrees Able to turn 360 degrees safely but slowly    Standing Unsupported, Alternately Place Feet on Step/Stool Able to stand independently and safely and complete 8 steps in 20 seconds    Standing Unsupported, One Foot in [employer] balance while stepping or standing    Standing on One Leg Unable to try or needs assist to prevent fall    Total Score 33    Berg comment: 33/56, high fall risk                      Objective measurements completed on examination: See above findings.                 PT Short Term Goals - 10/30/19 1159      PT SHORT TERM GOAL #1   Title Pt will be ind with seated HEP for trunk and LE strength.    Time 3    Period Weeks    Status New    Target Date 11/20/19      PT SHORT TERM GOAL #2   Title Pt will be ind with standing counter HEP for strength and balance.    Time 4    Period Weeks    Status New    Target Date 11/27/19      PT SHORT TERM GOAL #3   Title Pt will report increased tolerance of standing tasks for up to 10 min before needing to sit for pain and fatigue.    Time 4    Period Weeks    Status New    Target Date 11/27/19      PT SHORT TERM GOAL #4   Title Pt will participate in TUG and DGI to set further LTGs for dynamic balance and endurance.    Time 3    Period Weeks    Status New    Target Date 11/20/19             PT Long Term Goals - 10/30/19 1200      PT LONG TERM GOAL #1   Title be independent in advanced HEP    Time 8    Period Weeks    Status New    Target Date 12/25/19      PT LONG TERM  GOAL #2   Title Pt will improve BERG score to at least 40/56 to demo less fall risk    Baseline 33/56    Time 8    Period Weeks    Status New    Target Date 12/25/19      PT LONG TERM GOAL #3   Title Pt will perform 5x sit to stand with light use of UEs without balance loss in </= 14 sec.    Time 8    Period Weeks    Status New    Target Date 12/25/19      PT LONG TERM GOAL #4   Title Pt will report improved tolerance in running short errands for up to 20-30 min  before needing to sit or lay down.    Time 8    Period Weeks    Status New    Target Date 12/25/19      PT LONG TERM GOAL #5   Title Pt will achieve at least 4+/5 in bil LEs and trunk for improved tolerance of daily tasks and transfers.    Time 8    Period Weeks    Status New    Target Date 12/25/19                  Plan - 10/30/19 1150    Clinical Impression Statement Pt is a pleasant 69yo female with history of recent fall at home with LOC.  She reports she feels very unsteady and has for several years.  She has complex history of 2x kyphoplasty T12 and L2 with osteoporosis, lumbar PLIF L3-L5, and severe facet degen at L5/S1.  She is limited in standing to 1-2 min due to back pain.  She has flexed trunk posture with increased kyphosis, 50% limitation in spinal ROM, 4-/5 strength in trunk and LEs bil.  She is high fall risk with 5x sit to stand 19 sec and BERG score 33/56.  She was too limited by pain and fatigue to perform DGI or TUG today.  PT initiated seated ther ex routine today and discussed plan for future appointments.  Pt is an excellent candidate for PT to address balance, strength, funcitonal movement and self-care for bone health.    Personal Factors and Comorbidities Time since onset of injury/illness/exacerbation;Comorbidity 1;Comorbidity 2    Comorbidities osteoporosis, falls, past spinal fractures and PLIF    Examination-Activity Limitations Locomotion Level;Transfers;Bed Mobility;Stand     Examination-Participation Restrictions Cleaning;Meal Prep;Laundry;Community Activity    Stability/Clinical Decision Making Stable/Uncomplicated    Clinical Decision Making Low    Rehab Potential Good    PT Frequency 2x / week    PT Duration 8 weeks    PT Treatment/Interventions ADLs/Self Care Home Management;Cryotherapy;Moist Heat;Traction;Neuromuscular re-education;Balance training;Therapeutic exercise;Therapeutic activities;Functional mobility training;Stair training;Gait training;Patient/family education;Manual techniques;Passive range of motion;Dry needling;Energy conservation    PT Next Visit Plan give spinal decompression, do TUG and DGI and set goals, review HEP, add seated yellow band, progress counter routine for SLS, toe taps, weight shifts    PT Home Exercise Plan Access Code: [email or web address]    Consulted and Agree with Plan of Care Patient           Patient will benefit from skilled therapeutic intervention in order to improve the following deficits and impairments:  Abnormal gait, Decreased coordination, Decreased range of motion, Difficulty walking, Increased muscle spasms, Decreased endurance, Decreased activity tolerance, Pain, Improper body mechanics, Hypomobility, Decreased balance, Decreased mobility, Decreased strength, Postural dysfunction  Visit Diagnosis: Muscle weakness (generalized) - Plan: PT plan of care cert/re-cert  Chronic midline low back pain without sciatica - Plan: PT plan of care cert/re-cert     Problem List Patient Active Problem List   Diagnosis Date Noted  . Intractable nausea and vomiting 08/31/2019  . Epigastric pain 08/31/2019  . Alcohol abuse 08/31/2019  . Microcytic anemia 08/31/2019  . Closed compression fracture of L2 vertebra ([place]) 12/11/2017  . Pain 07/26/2015  . Anxiety state 06/28/2015  . Benzodiazepine dependence ([place]) 10/13/2014  . Opioid abuse with opioid-induced mood disorder ([place]) 10/13/2014  . Bipolar 1 disorder, depressed ([place])  10/12/2014  . Spondylolisthesis of lumbar region 07/28/2014  . Osteoporosis, unspecified 11/17/2013  . Migraines   . Herpes   .  Hypothyroidism 08/13/2008  . HYPERCHOLESTEROLEMIA 08/13/2008  . Bipolar disorder ([place]) 08/13/2008  . CHRONIC OBSTRUCTIVE ASTHMA UNSPECIFIED 08/13/2008  . GERD (gastroesophageal reflux disease) 08/13/2008  . HIATAL HERNIA 08/13/2008  . ALLERGY 08/13/2008    Baruch Merl, PT 10/30/19 12:08 PM   [facility] [address] [place], [place], [address] Phone: [phone]   Fax:  [phone]  Name: SHONITA RINCK MRN: [id number] Date of Birth: 03/03/1951

## 2019-10-30 NOTE — Patient Instructions (Signed)
Access Code: [email or web address] URL: [email or web address]: 08/13/2021Prepared by: Venetia Night BeuhringExercises  Seated Heel Toe Raises - 1 x daily - 7 x weekly - 2 sets - 10 reps  Seated Long Arc Quad - 1 x daily - 7 x weekly - 2 sets - 10 reps  Seated Gluteal Sets - 1 x daily - 7 x weekly - 1 sets - 10 reps - 3 hold  Sit to Stand with Counter Support - 1 x daily - 7 x weekly - 2 sets - 5 reps  Standing March with Counter Support - 1 x daily - 7 x weekly - 1 sets - 20 reps

## 2019-11-02 ENCOUNTER — Ambulatory Visit: Payer: Medicare HMO | Admitting: Physical Therapy

## 2019-11-04 ENCOUNTER — Other Ambulatory Visit: Payer: Self-pay

## 2019-11-04 ENCOUNTER — Encounter: Payer: Self-pay | Admitting: Physical Therapy

## 2019-11-04 ENCOUNTER — Ambulatory Visit: Payer: Medicare HMO | Admitting: Physical Therapy

## 2019-11-04 DIAGNOSIS — G8929 Other chronic pain: Secondary | ICD-10-CM

## 2019-11-04 DIAGNOSIS — M6281 Muscle weakness (generalized): Secondary | ICD-10-CM

## 2019-11-04 DIAGNOSIS — M545 Low back pain, unspecified: Secondary | ICD-10-CM

## 2019-11-04 NOTE — Patient Instructions (Signed)
RE-ALIGNMENT ROUTINE EXERCISES-OSTEOPROROSIS BASIC FOR POSTURAL CORRECTION   RE-ALIGNMENT Tips BENEFITS: 1.It helps to re-align the curves of the back and improve standing posture. 2.It allows the back muscles to rest and strengthen in preparation for more activity. FREQUENCY: Daily, even after weeks, months and years of more advanced exercises. START: 1.All exercises start in the same position: lying on the back, arms resting on the supporting surface, palms up and slightly away from the body, backs of hands down, knees bent, feet flat. 2.The head, neck, arms, and legs are supported according to specific instructions of your therapist. Copyright  V[employer]. All rights reserved.    1. Decompression Exercise: Basic.   Takes compression off the vertebral bodies; increases tolerance for lying on the back; helps relieve back pain   Lie on back on firm surface, knees bent, feet flat, arms turned up, out to sides (~35 degrees). Head neck and arms supported as necessary. Time _3-10__ minutes. Surface: floor     2. Shoulder Press  Strengthens upper back extensors and scapular retractors.   Press both shoulders down. Hold _2-3__ seconds. Repeat _3-5__ times. Surface: floor        3. Head Press With [place]  Strengthens neck extensors   Tuck chin SLIGHTLY toward chest, keep mouth closed. Feel weight on back of head. Increase weight by pressing head down. Hold _2-3__ seconds. Relax. Repeat 3-5___ times. Surface: floor     4. Leg Lengthener: stretches quadratus lumborum and hip flexors.  Strengthens quads and ankle dorsiflexors.  Leg Lengthener: Full    Straighten one leg. Pull toes AND forefoot toward knee, extend heel. Lengthen leg by pulling pelvis away from ribs. Hold __5_ seconds. Relax. Repeat 1 time. Re-bend knee. Do other leg. Each leg __5_ times. Surface: floor    Leg Lengthener / Leg Press Combo: Single Leg    Straighten one leg down to floor. Pull toes AND forefoot  toward knee; extend heel. Lengthen leg by pulling pelvis away from ribs. Press leg down. DO NOT BEND KNEE. Hold _5__ seconds. Relax leg. Repeat exercise 1 time. Relax leg. Re-bend knee. Repeat with other leg. Do 5 times

## 2019-11-04 NOTE — Therapy (Signed)
[facility] [address], [place], [place], [address] Phone: [phone]   Fax:  [phone]  Physical Therapy Treatment  Patient Details  Name: Victoria Levine MRN: [id number] Date of Birth: [birth year] Referring Provider (PT): [name], MD   Encounter Date: 11/04/2019   PT End of Session - 11/04/19 0848    Visit Number 2    Date for PT Re-Evaluation 12/25/19    Authorization Type [employer] approved 12 visits 8/13-10/8    Authorization Time Period 8/13-10/8    Progress Note Due on Visit 10    PT Start Time 0850    PT Stop Time 0930    PT Time Calculation (min) 40 min    Activity Tolerance Patient tolerated treatment well;Patient limited by fatigue;Patient limited by pain    Behavior During Therapy [facility] for tasks assessed/performed           Past Medical History:  Diagnosis Date  . Anemia    low iron  . Anxiety   . Asthma   . Bipolar disorder ([place])   . Chronic back pain   . Compression fracture of L2 lumbar vertebra (HCC)   . DDD (degenerative disc disease)   . Depression   . Dyspnea   . GERD (gastroesophageal reflux disease)   . H/O alcohol abuse    28 years sober  . H/O suicide attempt   . Herpes   . History of hiatal hernia   . Hypothyroidism   . Migraines   . Osteoporosis 11/2018   T score of -2.6  . Pneumonia   . S/P adenoidectomy   . S/P tonsillectomy     Past Surgical History:  Procedure Laterality Date  . CESAREAN SECTION    . COLONOSCOPY     as a teenager  . Compression fracture spine    . DILATION AND CURETTAGE OF UTERUS    . EYE SURGERY Bilateral    CATARACTS REMOVED, NO LENS PLACED  . HYSTEROSCOPY    . KYPHOPLASTY N/A 12/19/2016   Procedure: THORACIC 12 KYPHOPLASTY; REQUEST 60 MINS AND FLIP ROOM;  Surgeon: Phylliss Bob, MD;  Location: [place];  Service: Orthopedics;  Laterality: N/A;  THORACIC 12 KYPHOPLASTY; REQUEST 60 MINS AND FLIP ROOM  . KYPHOPLASTY N/A  12/11/2017   Procedure: LUMBAR 2 KYPHOPLASTY;  Surgeon: Phylliss Bob, MD;  Location: [place];  Service: Orthopedics;  Laterality: N/A;  . LUMBAR FUSION    . TONSILLECTOMY      There were no vitals filed for this visit.       [facility] PT Assessment - 11/04/19 0001      Standardized Balance Assessment   Standardized Balance Assessment Dynamic Gait Index      Dynamic Gait Index   Level Surface Normal    Change in Gait Speed Mild Impairment    Gait with Horizontal Head Turns Mild Impairment    Gait with Vertical Head Turns Mild Impairment    Gait and Pivot Turn Normal    Step Over Obstacle Mild Impairment    Step Around Obstacles Normal    Steps Normal    Total Score 20    DGI comment: 20/24                         [facility] Adult PT Treatment/Exercise - 11/04/19 0001      Exercises   Exercises Lumbar;Knee/Hip;Shoulder      Lumbar Exercises: Aerobic   Nustep L1 x  5' PT present to review plan for today      Lumbar Exercises: Seated   Long Arc Quad on Chair Strengthening;Both;15 reps    Other Seated Lumbar Exercises marching in chair with TrA x 30 reps      Lumbar Exercises: Supine   Other Supine Lumbar Exercises spinal decompression series (HEP): lay supine hooklying, chin nod, scap squeeze, leg lengthener, leg press      Shoulder Exercises: Seated   Extension Strengthening;10 reps;Theraband    Theraband Level (Shoulder Extension) Level 1 (Yellow)    Row Strengthening;10 reps;Theraband;Both    Theraband Level (Shoulder Row) Level 1 (Yellow)               Balance Exercises - 11/04/19 0001      Balance Exercises: Standing   Standing Eyes Opened Narrow base of support (BOS);1 rep;30 secs;Solid surface   bil UE support   SLS Eyes open;Upper extremity support 2;2 reps   5 sec, 2 rounds bil   Marching Solid surface;Upper extremity assist 2;20 reps             PT Education - 11/04/19 0854    Education Details spinal decompression, HEP progression  seated    Person(s) Educated Patient    Methods Explanation;Handout;Verbal cues;Tactile cues    Comprehension Verbalized understanding;Returned demonstration            PT Short Term Goals - 11/04/19 0851      PT SHORT TERM GOAL #1   Title Pt will be ind with seated HEP for trunk and LE strength.    Status On-going             PT Long Term Goals - 10/30/19 1200      PT LONG TERM GOAL #1   Title be independent in advanced HEP    Time 8    Period Weeks    Status New    Target Date 12/25/19      PT LONG TERM GOAL #2   Title Pt will improve BERG score to at least 40/56 to demo less fall risk    Baseline 33/56    Time 8    Period Weeks    Status New    Target Date 12/25/19      PT LONG TERM GOAL #3   Title Pt will perform 5x sit to stand with light use of UEs without balance loss in </= 14 sec.    Time 8    Period Weeks    Status New    Target Date 12/25/19      PT LONG TERM GOAL #4   Title Pt will report improved tolerance in running short errands for up to 20-30 min before needing to sit or lay down.    Time 8    Period Weeks    Status New    Target Date 12/25/19      PT LONG TERM GOAL #5   Title Pt will achieve at least 4+/5 in bil LEs and trunk for improved tolerance of daily tasks and transfers.    Time 8    Period Weeks    Status New    Target Date 12/25/19                 Plan - 11/04/19 1233    Clinical Impression Statement Pt reported fatigue but no set back after initial eval last visit.  She is limited by back pain and fatigue with all standing tasks but had taken  a pain medication before today's session.  She was able to participate in the DGI today with score of 20/24 which demos less fall risk than BERG did at eval, suggesting more static strength/balance deficits.  PT gave seated yellow band UE ther ex to add to HEP for postural strength and gave supine spinal decompression series for mid-morning and mid-afternoon to help manage pain,  posture and bone health.  Pt will continue to benefit from skilled PT along POC with ongoing monitoring of pain/fatigue and response to interventions.    Comorbidities osteoporosis, falls, past spinal fractures and PLIF    Rehab Potential Good    PT Frequency 2x / week    PT Duration 8 weeks    PT Treatment/Interventions ADLs/Self Care Home Management;Cryotherapy;Moist Heat;Traction;Neuromuscular re-education;Balance training;Therapeutic exercise;Therapeutic activities;Functional mobility training;Stair training;Gait training;Patient/family education;Manual techniques;Passive range of motion;Dry needling;Energy conservation    PT Next Visit Plan f/u on spinal decompression and yellow seated band ext/row, add supine horiz abd and ER, sit to stand and standing ther ex in small bouts as tol for balance (narrow BOS, SLS, tandem)    PT Home Exercise Plan Access Code: [email or web address]    Consulted and Agree with Plan of Care Patient           Patient will benefit from skilled therapeutic intervention in order to improve the following deficits and impairments:     Visit Diagnosis: Muscle weakness (generalized)  Chronic midline low back pain without sciatica     Problem List Patient Active Problem List   Diagnosis Date Noted  . Intractable nausea and vomiting 08/31/2019  . Epigastric pain 08/31/2019  . Alcohol abuse 08/31/2019  . Microcytic anemia 08/31/2019  . Closed compression fracture of L2 vertebra ([place]) 12/11/2017  . Pain 07/26/2015  . Anxiety state 06/28/2015  . Benzodiazepine dependence ([place]) 10/13/2014  . Opioid abuse with opioid-induced mood disorder ([place]) 10/13/2014  . Bipolar 1 disorder, depressed ([place]) 10/12/2014  . Spondylolisthesis of lumbar region 07/28/2014  . Osteoporosis, unspecified 11/17/2013  . Migraines   . Herpes   . Hypothyroidism 08/13/2008  . HYPERCHOLESTEROLEMIA 08/13/2008  . Bipolar disorder ([place]) 08/13/2008  . CHRONIC OBSTRUCTIVE ASTHMA UNSPECIFIED 08/13/2008   . GERD (gastroesophageal reflux disease) 08/13/2008  . HIATAL HERNIA 08/13/2008  . ALLERGY 08/13/2008    Baruch Merl, PT 11/04/19 12:38 PM   [facility] [address] [place], [place], [address] Phone: [phone]   Fax:  [phone]  Name: Victoria Levine MRN: [id number] Date of Birth: 1950-07-03

## 2019-11-10 ENCOUNTER — Ambulatory Visit: Payer: Medicare HMO | Admitting: Physical Therapy

## 2019-11-10 ENCOUNTER — Encounter: Payer: Self-pay | Admitting: Physical Therapy

## 2019-11-10 ENCOUNTER — Other Ambulatory Visit: Payer: Self-pay

## 2019-11-10 DIAGNOSIS — G8929 Other chronic pain: Secondary | ICD-10-CM

## 2019-11-10 DIAGNOSIS — M6281 Muscle weakness (generalized): Secondary | ICD-10-CM | POA: Diagnosis not present

## 2019-11-10 NOTE — Therapy (Signed)
[facility] [address] [place], [place], [address] Phone: [phone]   Fax:  [phone]  Physical Therapy Treatment  Patient Details  Name: Victoria Levine MRN: [id number] Date of Birth: 1950/09/26 Referring Provider (PT): [name], MD   Encounter Date: 11/10/2019   PT End of Session - 11/10/19 1025    Visit Number 3    Date for PT Re-Evaluation 12/25/19    Authorization Type [employer] approved 12 visits 8/13-10/8    Authorization Time Period 8/13-10/8    Authorization - Visit Number 3    Authorization - Number of Visits 12    Progress Note Due on Visit 10    PT Start Time 1025   Pt 10 min late   PT Stop Time 1057   Pt late short session   PT Time Calculation (min) 32 min    Activity Tolerance Patient tolerated treatment well;Patient limited by fatigue;Patient limited by pain    Behavior During Therapy [facility] for tasks assessed/performed           Past Medical History:  Diagnosis Date  . Anemia    low iron  . Anxiety   . Asthma   . Bipolar disorder ([place])   . Chronic back pain   . Compression fracture of L2 lumbar vertebra (HCC)   . DDD (degenerative disc disease)   . Depression   . Dyspnea   . GERD (gastroesophageal reflux disease)   . H/O alcohol abuse    28 years sober  . H/O suicide attempt   . Herpes   . History of hiatal hernia   . Hypothyroidism   . Migraines   . Osteoporosis 11/2018   T score of -2.6  . Pneumonia   . S/P adenoidectomy   . S/P tonsillectomy     Past Surgical History:  Procedure Laterality Date  . CESAREAN SECTION    . COLONOSCOPY     as a teenager  . Compression fracture spine    . DILATION AND CURETTAGE OF UTERUS    . EYE SURGERY Bilateral    CATARACTS REMOVED, NO LENS PLACED  . HYSTEROSCOPY    . KYPHOPLASTY N/A 12/19/2016   Procedure: THORACIC 12 KYPHOPLASTY; REQUEST 60 MINS AND FLIP ROOM;  Surgeon: Phylliss Bob, MD;  Location: [place];  Service:  Orthopedics;  Laterality: N/A;  THORACIC 12 KYPHOPLASTY; REQUEST 60 MINS AND FLIP ROOM  . KYPHOPLASTY N/A 12/11/2017   Procedure: LUMBAR 2 KYPHOPLASTY;  Surgeon: Phylliss Bob, MD;  Location: [place];  Service: Orthopedics;  Laterality: N/A;  . LUMBAR FUSION    . TONSILLECTOMY      There were no vitals filed for this visit.   Subjective Assessment - 11/10/19 1025    Subjective Pt 10 min late.  My back hurts really bad today.    Pertinent History PMH: OSTEOPOROSIS, + alcohol use, bipolar, HLD, GERD    How long can you stand comfortably? 1-2 min, feel unstable/woozy    Diagnostic tests 2019 xrays and MRI: T12 and L2 compression fractures with kyphoplasty, PLIF L3-L5, L5/S1 severe facet degeneration.    Patient Stated Goals work on balance (standing), tolerate short grocery store trips without having to lay down before unloading    Currently in Pain? Yes    Pain Score 6     Pain Location Back    Pain Orientation Lower    Pain Descriptors / Indicators Aching    Pain Onset More than a month ago  Pain Frequency Constant    Aggravating Factors  standing    Pain Relieving Factors laying down    Effect of Pain on Daily Activities errands                             [facility] Adult PT Treatment/Exercise - 11/10/19 0001      Lumbar Exercises: Aerobic   Nustep L2 x 6', PT present to monitor fatigue, PT cued SPM 40+      Lumbar Exercises: Machines for Strengthening   Leg Press bil LEs 45lb 2x15      Knee/Hip Exercises: Standing   Forward Step Up Both;2 sets;5 sets;Hand Hold: 2;Step Height: 6"    Forward Step Up Limitations PT cued stand tall               Balance Exercises - 11/10/19 0001      Balance Exercises: Standing   Standing Eyes Opened Narrow base of support (BOS);Head turns;Foam/compliant surface;1 rep;30 secs   bil UE support   Rebounder 10 reps;Double leg   weight shifts lat/stagger x 10 each              PT Short Term Goals - 11/04/19 0851        PT SHORT TERM GOAL #1   Title Pt will be ind with seated HEP for trunk and LE strength.    Status On-going             PT Long Term Goals - 10/30/19 1200      PT LONG TERM GOAL #1   Title be independent in advanced HEP    Time 8    Period Weeks    Status New    Target Date 12/25/19      PT LONG TERM GOAL #2   Title Pt will improve BERG score to at least 40/56 to demo less fall risk    Baseline 33/56    Time 8    Period Weeks    Status New    Target Date 12/25/19      PT LONG TERM GOAL #3   Title Pt will perform 5x sit to stand with light use of UEs without balance loss in </= 14 sec.    Time 8    Period Weeks    Status New    Target Date 12/25/19      PT LONG TERM GOAL #4   Title Pt will report improved tolerance in running short errands for up to 20-30 min before needing to sit or lay down.    Time 8    Period Weeks    Status New    Target Date 12/25/19      PT LONG TERM GOAL #5   Title Pt will achieve at least 4+/5 in bil LEs and trunk for improved tolerance of daily tasks and transfers.    Time 8    Period Weeks    Status New    Target Date 12/25/19                 Plan - 11/10/19 1036    Clinical Impression Statement Pt arrived 10 min late today and was dizzy from using inhaler.  She had increased LBP 6/10 on arrival.  She reported soreness in her neck with spinal decompression so PT reviewed more gentle retraction today as she was overdoing.  She is compliant with HEP.  She is limited by poor endurance and  back pain.  She is generally inactive at home.  PT focused on balance and LE/UE strength today using leg press and UBE machines which Pt liked.  Pt continues to need close supervision or CGA for all balance tasks.  Pt with report of fatigue end of session.  Continue along POC with focus on strength and balance as well as bone health.    Comorbidities osteoporosis, falls, past spinal fractures and PLIF    PT Frequency 2x / week    PT Duration 8  weeks    PT Treatment/Interventions ADLs/Self Care Home Management;Cryotherapy;Moist Heat;Traction;Neuromuscular re-education;Balance training;Therapeutic exercise;Therapeutic activities;Functional mobility training;Stair training;Gait training;Patient/family education;Manual techniques;Passive range of motion;Dry needling;Energy conservation    PT Next Visit Plan review HEP, add counter tandem walking fwd/bwd and narrow BOS to HEP, continue NuStep, UBE, leg press    PT Home Exercise Plan Access Code: [email or web address]    Consulted and Agree with Plan of Care Patient           Patient will benefit from skilled therapeutic intervention in order to improve the following deficits and impairments:     Visit Diagnosis: Muscle weakness (generalized)  Chronic midline low back pain without sciatica     Problem List Patient Active Problem List   Diagnosis Date Noted  . Intractable nausea and vomiting 08/31/2019  . Epigastric pain 08/31/2019  . Alcohol abuse 08/31/2019  . Microcytic anemia 08/31/2019  . Closed compression fracture of L2 vertebra ([place]) 12/11/2017  . Pain 07/26/2015  . Anxiety state 06/28/2015  . Benzodiazepine dependence ([place]) 10/13/2014  . Opioid abuse with opioid-induced mood disorder ([place]) 10/13/2014  . Bipolar 1 disorder, depressed ([place]) 10/12/2014  . Spondylolisthesis of lumbar region 07/28/2014  . Osteoporosis, unspecified 11/17/2013  . Migraines   . Herpes   . Hypothyroidism 08/13/2008  . HYPERCHOLESTEROLEMIA 08/13/2008  . Bipolar disorder ([place]) 08/13/2008  . CHRONIC OBSTRUCTIVE ASTHMA UNSPECIFIED 08/13/2008  . GERD (gastroesophageal reflux disease) 08/13/2008  . HIATAL HERNIA 08/13/2008  . ALLERGY 08/13/2008    Baruch Merl, PT 11/10/19 10:59 AM   [facility] [address], [place], [place], [address] Phone: [phone]   Fax:  [phone]  Name: AEDYN MCKEON MRN: [id number] Date of  Birth: 02/14/1951

## 2019-11-13 ENCOUNTER — Ambulatory Visit: Payer: Medicare HMO | Admitting: Physical Therapy

## 2019-11-13 ENCOUNTER — Other Ambulatory Visit: Payer: Self-pay

## 2019-11-13 ENCOUNTER — Encounter: Payer: Self-pay | Admitting: Physical Therapy

## 2019-11-13 DIAGNOSIS — M6281 Muscle weakness (generalized): Secondary | ICD-10-CM

## 2019-11-13 DIAGNOSIS — G8929 Other chronic pain: Secondary | ICD-10-CM

## 2019-11-13 DIAGNOSIS — M545 Low back pain, unspecified: Secondary | ICD-10-CM

## 2019-11-13 NOTE — Therapy (Signed)
[facility] [address], [place], [place], [address] Phone: [phone]   Fax:  [phone]  Physical Therapy Treatment  Patient Details  Name: Victoria Levine MRN: [id number] Date of Birth: April 05, 1950 Referring Provider (PT): [name], MD   Encounter Date: 11/13/2019   PT End of Session - 11/13/19 1107    Visit Number 4    Date for PT Re-Evaluation 12/25/19    Authorization Type [employer] approved 12 visits 8/13-10/8    Authorization Time Period 8/13-10/8    Authorization - Visit Number 4    Authorization - Number of Visits 12    Progress Note Due on Visit 10    PT Start Time 1105    PT Stop Time 8242    PT Time Calculation (min) 40 min    Activity Tolerance Patient tolerated treatment well;Patient limited by fatigue;Patient limited by pain    Behavior During Therapy [facility] for tasks assessed/performed           Past Medical History:  Diagnosis Date   Anemia    low iron   Anxiety    Asthma    Bipolar disorder (HCC)    Chronic back pain    Compression fracture of L2 lumbar vertebra (HCC)    DDD (degenerative disc disease)    Depression    Dyspnea    GERD (gastroesophageal reflux disease)    H/O alcohol abuse    28 years sober   H/O suicide attempt    Herpes    History of hiatal hernia    Hypothyroidism    Migraines    Osteoporosis 11/2018   T score of -2.6   Pneumonia    S/P adenoidectomy    S/P tonsillectomy     Past Surgical History:  Procedure Laterality Date   CESAREAN SECTION     COLONOSCOPY     as a teenager   Compression fracture spine     DILATION AND CURETTAGE OF UTERUS     EYE SURGERY Bilateral    CATARACTS REMOVED, NO LENS PLACED   HYSTEROSCOPY     KYPHOPLASTY N/A 12/19/2016   Procedure: THORACIC 12 KYPHOPLASTY; REQUEST 60 MINS AND FLIP ROOM;  Surgeon: Phylliss Bob, MD;  Location: [place];  Service: Orthopedics;  Laterality: N/A;   THORACIC 12 KYPHOPLASTY; REQUEST 60 MINS AND FLIP ROOM   KYPHOPLASTY N/A 12/11/2017   Procedure: LUMBAR 2 KYPHOPLASTY;  Surgeon: Phylliss Bob, MD;  Location: [place];  Service: Orthopedics;  Laterality: N/A;   LUMBAR FUSION     TONSILLECTOMY      There were no vitals filed for this visit.   Subjective Assessment - 11/13/19 1106    Subjective I am feeling good today.    Pertinent History PMH: OSTEOPOROSIS, + alcohol use, bipolar, HLD, GERD    Limitations Standing    How long can you stand comfortably? 1-2 min, feel unstable/woozy    Diagnostic tests 2019 xrays and MRI: T12 and L2 compression fractures with kyphoplasty, PLIF L3-L5, L5/S1 severe facet degeneration.    Patient Stated Goals work on balance (standing), tolerate short grocery store trips without having to lay down before unloading    Currently in Pain? Yes    Pain Score 4     Pain Location Back    Pain Orientation Lower    Pain Descriptors / Indicators Aching    Pain Onset More than a month ago    Pain Frequency Constant    Aggravating Factors  standing    Pain Relieving Factors laying down, meds    Effect of Pain on Daily Activities errands                             [facility] Adult PT Treatment/Exercise - 11/13/19 0001      Exercises   Exercises Lumbar;Knee/Hip      Lumbar Exercises: Aerobic   Nustep L2 x 7', PT present to discuss progress and plan for today's session      Lumbar Exercises: Supine   Other Supine Lumbar Exercises spinal decompression series: 5 each holding 3 sec: nods, scap squeeze, leg reach, leg reach and press      Knee/Hip Exercises: Machines for Strengthening   Cybex Leg Press 55lb bil LEs 2x15      Knee/Hip Exercises: Seated   Long Arc Quad AROM;Strengthening;Both;10 reps      Shoulder Exercises: Seated   Extension Strengthening;Both;10 reps;Theraband    Theraband Level (Shoulder Extension) Level 2 (Red)    Row Strengthening;Both;10 reps;Theraband    Theraband Level  (Shoulder Row) Level 2 (Red)    Other Seated Exercises bil UE flexion and scaption 2x5 1lb dumbbells      Shoulder Exercises: ROM/Strengthening   UBE (Upper Arm Bike) UBE fwd/bwd each min x 4' PT present to monitor fatigue               Balance Exercises - 11/13/19 0001      Balance Exercises: Standing   Tandem Gait Forward;Retro;4 reps;Upper extremity support    Partial Tandem Stance Eyes open;5 reps   5 sec   Step Over Hurdles / Cones cone weaving and pool noodle step overs x 3 laps 20' length down/back x 3 cycles, CGA with gait belt, PT VCs for heel strike with step overs and wider BOS during turns around cones    Other Standing Exercises countertop marching x 20 reps               PT Short Term Goals - 11/13/19 1152      PT SHORT TERM GOAL #1   Title Pt will be ind with seated HEP for trunk and LE strength.    Status Achieved      PT SHORT TERM GOAL #2   Title Pt will be ind with standing counter HEP for strength and balance.    Status On-going      PT SHORT TERM GOAL #3   Title Pt will report increased tolerance of standing tasks for up to 10 min before needing to sit for pain and fatigue.    Status On-going      PT SHORT TERM GOAL #4   Title Pt will participate in TUG and DGI to set further LTGs for dynamic balance and endurance.    Status Achieved             PT Long Term Goals - 10/30/19 1200      PT LONG TERM GOAL #1   Title be independent in advanced HEP    Time 8    Period Weeks    Status New    Target Date 12/25/19      PT LONG TERM GOAL #2   Title Pt will improve BERG score to at least 40/56 to demo less fall risk    Baseline 33/56    Time 8    Period Weeks    Status New    Target Date 12/25/19  PT LONG TERM GOAL #3   Title Pt will perform 5x sit to stand with light use of UEs without balance loss in </= 14 sec.    Time 8    Period Weeks    Status New    Target Date 12/25/19      PT LONG TERM GOAL #4   Title Pt will report  improved tolerance in running short errands for up to 20-30 min before needing to sit or lay down.    Time 8    Period Weeks    Status New    Target Date 12/25/19      PT LONG TERM GOAL #5   Title Pt will achieve at least 4+/5 in bil LEs and trunk for improved tolerance of daily tasks and transfers.    Time 8    Period Weeks    Status New    Target Date 12/25/19                 Plan - 11/13/19 1148    Clinical Impression Statement Pt with good pain control on arrival today.  She reports she continues to have gait instability but feels she is getting stronger.  She is very motivated and an active participant throughout the session.  She was able to tolerate more time on both UBE and NuStep, more resistance with leg press and tband (red vs yellow) and is demonstrating improved awareness of upper posture for cervical and scapular positioning.  PT focused on tandem gait, weaving around cones and step overs with focus on heel strike for balance today, all with gait belt/CGA.  Pt demo's improved understanding of spinal decompression series with min cueing needed for leg lengthener and hold times vs reps without hold.  Pt will continue to benefit from skilled PT along POC.    Comorbidities osteoporosis, falls, past spinal fractures and PLIF    PT Frequency 2x / week    PT Duration 8 weeks    PT Treatment/Interventions ADLs/Self Care Home Management;Cryotherapy;Moist Heat;Traction;Neuromuscular re-education;Balance training;Therapeutic exercise;Therapeutic activities;Functional mobility training;Stair training;Gait training;Patient/family education;Manual techniques;Passive range of motion;Dry needling;Energy conservation    PT Next Visit Plan cones, tandem gait, try floor ladder, continue NuStep, UBE, postural strength, functional strength    PT Home Exercise Plan Access Code: [email or web address]    Consulted and Agree with Plan of Care Patient           Patient will benefit from skilled  therapeutic intervention in order to improve the following deficits and impairments:     Visit Diagnosis: Muscle weakness (generalized)  Chronic midline low back pain without sciatica     Problem List Patient Active Problem List   Diagnosis Date Noted   Intractable nausea and vomiting 08/31/2019   Epigastric pain 08/31/2019   Alcohol abuse 08/31/2019   Microcytic anemia 08/31/2019   Closed compression fracture of L2 vertebra ([place]) 12/11/2017   Pain 07/26/2015   Anxiety state 06/28/2015   Benzodiazepine dependence ([place]) 10/13/2014   Opioid abuse with opioid-induced mood disorder ([place]) 10/13/2014   Bipolar 1 disorder, depressed ([place]) 10/12/2014   Spondylolisthesis of lumbar region 07/28/2014   Osteoporosis, unspecified 11/17/2013   Migraines    Herpes    Hypothyroidism 08/13/2008   HYPERCHOLESTEROLEMIA 08/13/2008   Bipolar disorder ([place]) 08/13/2008   CHRONIC OBSTRUCTIVE ASTHMA UNSPECIFIED 08/13/2008   GERD (gastroesophageal reflux disease) 08/13/2008   HIATAL HERNIA 08/13/2008   ALLERGY 08/13/2008    Serria Sloma, PT 11/13/19 11:54 AM   [facility] [address] [place], [place], [address] Phone: [phone]   Fax:  [phone]  Name: Victoria Levine MRN: [id number] Date of Birth: 1950/11/29

## 2019-11-16 ENCOUNTER — Ambulatory Visit: Payer: Medicare HMO | Admitting: Physical Therapy

## 2019-11-17 ENCOUNTER — Ambulatory Visit
Admission: RE | Admit: 2019-11-17 | Discharge: 2019-11-17 | Disposition: A | Discharge status: Home / Self Care | Payer: Medicare HMO | Source: Ambulatory Visit | Attending: Gastroenterology | Admitting: Gastroenterology

## 2019-11-17 DIAGNOSIS — D649 Anemia, unspecified: Secondary | ICD-10-CM

## 2019-11-18 ENCOUNTER — Encounter: Payer: Self-pay | Admitting: Physical Therapy

## 2019-11-18 ENCOUNTER — Ambulatory Visit: Payer: Medicare HMO | Attending: Family Medicine | Admitting: Physical Therapy

## 2019-11-18 ENCOUNTER — Other Ambulatory Visit: Payer: Self-pay

## 2019-11-18 DIAGNOSIS — G8929 Other chronic pain: Secondary | ICD-10-CM | POA: Diagnosis present

## 2019-11-18 DIAGNOSIS — M545 Low back pain, unspecified: Secondary | ICD-10-CM

## 2019-11-18 DIAGNOSIS — M6281 Muscle weakness (generalized): Secondary | ICD-10-CM | POA: Diagnosis not present

## 2019-11-18 NOTE — Therapy (Signed)
[facility] [address], [place], [place], [address] Phone: [phone]   Fax:  [phone]  Physical Therapy Treatment  Patient Details  Name: Victoria Levine MRN: [id number] Date of Birth: 01-Jan-1951 Referring Provider (PT): [name], MD   Encounter Date: 11/18/2019   PT End of Session - 11/18/19 1226    Visit Number 5    Date for PT Re-Evaluation 12/25/19    Authorization Type [employer] approved 12 visits 8/13-10/8    Authorization Time Period 8/13-10/8    Authorization - Visit Number 5    Authorization - Number of Visits 12    Progress Note Due on Visit 10    PT Start Time 1226    PT Stop Time 1305    PT Time Calculation (min) 39 min    Activity Tolerance Patient tolerated treatment well    Behavior During Therapy [facility] for tasks assessed/performed           Past Medical History:  Diagnosis Date  . Anemia    low iron  . Anxiety   . Asthma   . Bipolar disorder ([place])   . Chronic back pain   . Compression fracture of L2 lumbar vertebra (HCC)   . DDD (degenerative disc disease)   . Depression   . Dyspnea   . GERD (gastroesophageal reflux disease)   . H/O alcohol abuse    28 years sober  . H/O suicide attempt   . Herpes   . History of hiatal hernia   . Hypothyroidism   . Migraines   . Osteoporosis 11/2018   T score of -2.6  . Pneumonia   . S/P adenoidectomy   . S/P tonsillectomy     Past Surgical History:  Procedure Laterality Date  . CESAREAN SECTION    . COLONOSCOPY     as a teenager  . Compression fracture spine    . DILATION AND CURETTAGE OF UTERUS    . EYE SURGERY Bilateral    CATARACTS REMOVED, NO LENS PLACED  . HYSTEROSCOPY    . KYPHOPLASTY N/A 12/19/2016   Procedure: THORACIC 12 KYPHOPLASTY; REQUEST 60 MINS AND FLIP ROOM;  Surgeon: Phylliss Bob, MD;  Location: [place];  Service: Orthopedics;  Laterality: N/A;  THORACIC 12 KYPHOPLASTY; REQUEST 60 MINS AND FLIP ROOM  .  KYPHOPLASTY N/A 12/11/2017   Procedure: LUMBAR 2 KYPHOPLASTY;  Surgeon: Phylliss Bob, MD;  Location: [place];  Service: Orthopedics;  Laterality: N/A;  . LUMBAR FUSION    . TONSILLECTOMY      There were no vitals filed for this visit.   Subjective Assessment - 11/18/19 1227    Subjective I'm ok, didn't sleep well last night.    Pertinent History PMH: OSTEOPOROSIS, + alcohol use, bipolar, HLD, GERD    Diagnostic tests 2019 xrays and MRI: T12 and L2 compression fractures with kyphoplasty, PLIF L3-L5, L5/S1 severe facet degeneration.    Patient Stated Goals work on balance (standing), tolerate short grocery store trips without having to lay down before unloading    Currently in Pain? No/denies    Multiple Pain Sites No                             [facility] Adult PT Treatment/Exercise - 11/18/19 0001      Neuro Re-ed    Neuro Re-ed Details  Stepping over cones, tandem walking, high knee marching  all light CGA: 4x length of  floor entering clinic   Alt toe taps  on steps 20x     Lumbar Exercises: Aerobic   Nustep L2 8 min with PTA present to discuss status      Lumbar Exercises: Supine   Other Supine Lumbar Exercises spinal decompression series: 5 each holding 3 sec: nods, scap squeeze, leg reach, leg reach and press   review, VC to remind pt how to do ea ex     Knee/Hip Exercises: Machines for Strengthening   Cybex Leg Press Seat 7: BilLE 60# 2x10      Knee/Hip Exercises: Seated   Sit to Sand 1 set;10 reps;without UE support      Shoulder Exercises: Seated   Row Strengthening;Both;15 reps;Theraband    Theraband Level (Shoulder Row) Level 3 (Green)                    PT Short Term Goals - 11/13/19 1152      PT SHORT TERM GOAL #1   Title Pt will be ind with seated HEP for trunk and LE strength.    Status Achieved      PT SHORT TERM GOAL #2   Title Pt will be ind with standing counter HEP for strength and balance.    Status On-going      PT SHORT  TERM GOAL #3   Title Pt will report increased tolerance of standing tasks for up to 10 min before needing to sit for pain and fatigue.    Status On-going      PT SHORT TERM GOAL #4   Title Pt will participate in TUG and DGI to set further LTGs for dynamic balance and endurance.    Status Achieved             PT Long Term Goals - 10/30/19 1200      PT LONG TERM GOAL #1   Title be independent in advanced HEP    Time 8    Period Weeks    Status New    Target Date 12/25/19      PT LONG TERM GOAL #2   Title Pt will improve BERG score to at least 40/56 to demo less fall risk    Baseline 33/56    Time 8    Period Weeks    Status New    Target Date 12/25/19      PT LONG TERM GOAL #3   Title Pt will perform 5x sit to stand with light use of UEs without balance loss in </= 14 sec.    Time 8    Period Weeks    Status New    Target Date 12/25/19      PT LONG TERM GOAL #4   Title Pt will report improved tolerance in running short errands for up to 20-30 min before needing to sit or lay down.    Time 8    Period Weeks    Status New    Target Date 12/25/19      PT LONG TERM GOAL #5   Title Pt will achieve at least 4+/5 in bil LEs and trunk for improved tolerance of daily tasks and transfers.    Time 8    Period Weeks    Status New    Target Date 12/25/19                 Plan - 11/18/19 1226    Clinical Impression Statement Pt arrives pain free, took medication this AM.  Pt initially showed some listing with her balance exercises but really improved her steadinesss with practice. Pt tolerated more resisatnce on teh leg press and posture tband exercises today. Pt fatigued at end of session and at times short of breath.    Personal Factors and Comorbidities Time since onset of injury/illness/exacerbation;Comorbidity 1;Comorbidity 2    Comorbidities osteoporosis, falls, past spinal fractures and PLIF    Examination-Activity Limitations Locomotion Level;Transfers;Bed  Mobility;Stand    Examination-Participation Restrictions Cleaning;Meal Prep;Laundry;Community Activity    Stability/Clinical Decision Making Stable/Uncomplicated    PT Frequency 2x / week    PT Duration 8 weeks    PT Treatment/Interventions ADLs/Self Care Home Management;Cryotherapy;Moist Heat;Traction;Neuromuscular re-education;Balance training;Therapeutic exercise;Therapeutic activities;Functional mobility training;Stair training;Gait training;Patient/family education;Manual techniques;Passive range of motion;Dry needling;Energy conservation    PT Next Visit Plan Balance ex, strength cont.    PT Home Exercise Plan Access Code: [email or web address]    Consulted and Agree with Plan of Care Patient           Patient will benefit from skilled therapeutic intervention in order to improve the following deficits and impairments:  Abnormal gait, Decreased coordination, Decreased range of motion, Difficulty walking, Increased muscle spasms, Decreased endurance, Decreased activity tolerance, Pain, Improper body mechanics, Hypomobility, Decreased balance, Decreased mobility, Decreased strength, Postural dysfunction  Visit Diagnosis: Muscle weakness (generalized)  Chronic midline low back pain without sciatica     Problem List Patient Active Problem List   Diagnosis Date Noted  . Intractable nausea and vomiting 08/31/2019  . Epigastric pain 08/31/2019  . Alcohol abuse 08/31/2019  . Microcytic anemia 08/31/2019  . Closed compression fracture of L2 vertebra ([place]) 12/11/2017  . Pain 07/26/2015  . Anxiety state 06/28/2015  . Benzodiazepine dependence ([place]) 10/13/2014  . Opioid abuse with opioid-induced mood disorder ([place]) 10/13/2014  . Bipolar 1 disorder, depressed ([place]) 10/12/2014  . Spondylolisthesis of lumbar region 07/28/2014  . Osteoporosis, unspecified 11/17/2013  . Migraines   . Herpes   . Hypothyroidism 08/13/2008  . HYPERCHOLESTEROLEMIA 08/13/2008  . Bipolar disorder ([place]) 08/13/2008  .  CHRONIC OBSTRUCTIVE ASTHMA UNSPECIFIED 08/13/2008  . GERD (gastroesophageal reflux disease) 08/13/2008  . HIATAL HERNIA 08/13/2008  . ALLERGY 08/13/2008    Dareion Kneece, PTA 11/18/2019, 1:02 PM  [facility] [address], [place], [place], [address] Phone: [phone]   Fax:  [phone]  Name: NEKITA PITA MRN: [id number] Date of Birth: 31-Aug-1950

## 2019-11-30 ENCOUNTER — Ambulatory Visit: Payer: Medicare HMO | Admitting: Physical Therapy

## 2019-12-01 ENCOUNTER — Telehealth: Payer: Self-pay | Admitting: Physical Therapy

## 2019-12-01 ENCOUNTER — Ambulatory Visit: Payer: Medicare HMO | Admitting: Physical Therapy

## 2019-12-01 ENCOUNTER — Other Ambulatory Visit: Payer: Self-pay

## 2019-12-01 ENCOUNTER — Encounter: Payer: Self-pay | Admitting: Physical Therapy

## 2019-12-01 DIAGNOSIS — M6281 Muscle weakness (generalized): Secondary | ICD-10-CM | POA: Diagnosis not present

## 2019-12-01 DIAGNOSIS — G8929 Other chronic pain: Secondary | ICD-10-CM

## 2019-12-01 NOTE — Therapy (Signed)
[facility] [address], [place], [place], [address] Phone: [phone]   Fax:  [phone]  Physical Therapy Treatment  Patient Details  Name: Victoria Levine MRN: [id number] Date of Birth: 04-07-1950 Referring Provider (PT): [name], MD   Encounter Date: 12/01/2019   PT End of Session - 12/01/19 1401    Visit Number 6    Number of Visits 12    Date for PT Re-Evaluation 12/25/19    Authorization Type [employer] approved 12 visits 8/13-10/8    Authorization Time Period 8/13-10/8    Authorization - Visit Number 6    Authorization - Number of Visits 12    Progress Note Due on Visit 10    PT Start Time 1402    PT Stop Time 1440    PT Time Calculation (min) 38 min    Activity Tolerance Patient tolerated treatment well    Behavior During Therapy [facility] for tasks assessed/performed           Past Medical History:  Diagnosis Date  . Anemia    low iron  . Anxiety   . Asthma   . Bipolar disorder ([place])   . Chronic back pain   . Compression fracture of L2 lumbar vertebra (HCC)   . DDD (degenerative disc disease)   . Depression   . Dyspnea   . GERD (gastroesophageal reflux disease)   . H/O alcohol abuse    28 years sober  . H/O suicide attempt   . Herpes   . History of hiatal hernia   . Hypothyroidism   . Migraines   . Osteoporosis 11/2018   T score of -2.6  . Pneumonia   . S/P adenoidectomy   . S/P tonsillectomy     Past Surgical History:  Procedure Laterality Date  . CESAREAN SECTION    . COLONOSCOPY     as a teenager  . Compression fracture spine    . DILATION AND CURETTAGE OF UTERUS    . EYE SURGERY Bilateral    CATARACTS REMOVED, NO LENS PLACED  . HYSTEROSCOPY    . KYPHOPLASTY N/A 12/19/2016   Procedure: THORACIC 12 KYPHOPLASTY; REQUEST 60 MINS AND FLIP ROOM;  Surgeon: Phylliss Bob, MD;  Location: [place];  Service: Orthopedics;  Laterality: N/A;  THORACIC 12 KYPHOPLASTY; REQUEST  60 MINS AND FLIP ROOM  . KYPHOPLASTY N/A 12/11/2017   Procedure: LUMBAR 2 KYPHOPLASTY;  Surgeon: Phylliss Bob, MD;  Location: [place];  Service: Orthopedics;  Laterality: N/A;  . LUMBAR FUSION    . TONSILLECTOMY      There were no vitals filed for this visit.   Subjective Assessment - 12/01/19 1402    Subjective I fell again last night in the kitchen - down onto my knees.  I didn't hit my head.    Pertinent History PMH: OSTEOPOROSIS, + alcohol use, bipolar, HLD, GERD    Limitations Standing    How long can you stand comfortably? 1-2 min, feel unstable/woozy    Diagnostic tests 2019 xrays and MRI: T12 and L2 compression fractures with kyphoplasty, PLIF L3-L5, L5/S1 severe facet degeneration.    Patient Stated Goals work on balance (standing), tolerate short grocery store trips without having to lay down before unloading    Currently in Pain? No/denies    Aggravating Factors  standing  [place] Adult PT Treatment/Exercise - 12/01/19 0001      Lumbar Exercises: Aerobic   Nustep L2 x 8', PT present to discuss symptoms      Knee/Hip Exercises: Machines for Strengthening   Cybex Leg Press Seat 7: bil LE 70lb 1x10, 60lb 1x10      Knee/Hip Exercises: Seated   Long Arc Quad Strengthening;Both;1 set;15 reps;Weights    Long Arc Quad Weight 2 lbs.    Marching Strengthening;Both;1 set;20 reps;Weights    Marching Weights 2 lbs.      Shoulder Exercises: Seated   Row Strengthening;Both;Theraband;15 reps    Theraband Level (Shoulder Row) Level 3 (Green)    Row Limitations seated on green ball    Flexion Strengthening;Both;Weights;10 reps    Flexion Limitations seated tall into small ball at scapulae       Shoulder Exercises: Standing   Flexion Strengthening;Theraband;10 reps;Both    Theraband Level (Shoulder Flexion) Level 1 (Yellow)    Extension Strengthening;Both;10 reps;Theraband    Theraband Level (Shoulder Extension) Level 1 (Yellow)                     PT Short Term Goals - 11/13/19 1152      PT SHORT TERM GOAL #1   Title Pt will be ind with seated HEP for trunk and LE strength.    Status Achieved      PT SHORT TERM GOAL #2   Title Pt will be ind with standing counter HEP for strength and balance.    Status On-going      PT SHORT TERM GOAL #3   Title Pt will report increased tolerance of standing tasks for up to 10 min before needing to sit for pain and fatigue.    Status On-going      PT SHORT TERM GOAL #4   Title Pt will participate in TUG and DGI to set further LTGs for dynamic balance and endurance.    Status Achieved             PT Long Term Goals - 10/30/19 1200      PT LONG TERM GOAL #1   Title be independent in advanced HEP    Time 8    Period Weeks    Status New    Target Date 12/25/19      PT LONG TERM GOAL #2   Title Pt will improve BERG score to at least 40/56 to demo less fall risk    Baseline 33/56    Time 8    Period Weeks    Status New    Target Date 12/25/19      PT LONG TERM GOAL #3   Title Pt will perform 5x sit to stand with light use of UEs without balance loss in </= 14 sec.    Time 8    Period Weeks    Status New    Target Date 12/25/19      PT LONG TERM GOAL #4   Title Pt will report improved tolerance in running short errands for up to 20-30 min before needing to sit or lay down.    Time 8    Period Weeks    Status New    Target Date 12/25/19      PT LONG TERM GOAL #5   Title Pt will achieve at least 4+/5 in bil LEs and trunk for improved tolerance of daily tasks and transfers.    Time 8    Period Weeks  Status New    Target Date 12/25/19                 Plan - 12/01/19 1425    Clinical Impression Statement Pt arrived feeling dizzy and with signif flexed posture and instability.  She fell last night to her knees without hitting head.  She admits having had some wine last night.  She states she feels her memory is off and contributed to her  missing last two appointments.  She denied any pain.  PT did not work on specific balance today due to Pt needing close supervision or CGA today due to several stumbles with basic mobility and transitions between exercises.  PT focused on upright seated postural strength with overlay of UE/LE ther ex with added resistance today.  PT discussed with Pt that she may want to contact her MD should she continue to feel more affected by confusion, memory troubles in the next couple of days.  Pt will continue to benefit from skilled PT along POC with ongoing monitoring and assessment of status and tolerance of interventions.    Comorbidities osteoporosis, falls, past spinal fractures and PLIF    Rehab Potential Good    PT Frequency 2x / week    PT Duration 8 weeks    PT Treatment/Interventions ADLs/Self Care Home Management;Cryotherapy;Moist Heat;Traction;Neuromuscular re-education;Balance training;Therapeutic exercise;Therapeutic activities;Functional mobility training;Stair training;Gait training;Patient/family education;Manual techniques;Passive range of motion;Dry needling;Energy conservation    PT Next Visit Plan Balance ex, trunk ext strength, LE strength cont, close monitoring of stability and balance with transitions    PT Home Exercise Plan Access Code: [email or web address]    Consulted and Agree with Plan of Care Patient           Patient will benefit from skilled therapeutic intervention in order to improve the following deficits and impairments:  Abnormal gait, Decreased coordination, Decreased range of motion, Difficulty walking, Increased muscle spasms, Decreased endurance, Decreased activity tolerance, Pain, Improper body mechanics, Hypomobility, Decreased balance, Decreased mobility, Decreased strength, Postural dysfunction  Visit Diagnosis: Muscle weakness (generalized)  Chronic midline low back pain without sciatica     Problem List Patient Active Problem List   Diagnosis Date Noted  .  Intractable nausea and vomiting 08/31/2019  . Epigastric pain 08/31/2019  . Alcohol abuse 08/31/2019  . Microcytic anemia 08/31/2019  . Closed compression fracture of L2 vertebra ([place]) 12/11/2017  . Pain 07/26/2015  . Anxiety state 06/28/2015  . Benzodiazepine dependence ([place]) 10/13/2014  . Opioid abuse with opioid-induced mood disorder ([place]) 10/13/2014  . Bipolar 1 disorder, depressed ([place]) 10/12/2014  . Spondylolisthesis of lumbar region 07/28/2014  . Osteoporosis, unspecified 11/17/2013  . Migraines   . Herpes   . Hypothyroidism 08/13/2008  . HYPERCHOLESTEROLEMIA 08/13/2008  . Bipolar disorder ([place]) 08/13/2008  . CHRONIC OBSTRUCTIVE ASTHMA UNSPECIFIED 08/13/2008  . GERD (gastroesophageal reflux disease) 08/13/2008  . HIATAL HERNIA 08/13/2008  . ALLERGY 08/13/2008    Baruch Merl, PT 12/01/19 2:43 PM   [facility] [address] [place], [place], [address] Phone: [phone]   Fax:  [phone]  Name: AUTUMNE KALLIO MRN: [id number] Date of Birth: [birth year]

## 2019-12-01 NOTE — Telephone Encounter (Signed)
Pt missed second consecutive physical therapy appointment today 12/01/19 at 11:45.  PT left a voice mail asking her to call our office regarding missed appointment and future appointments. Landri Dorsainvil, PT 12/01/19 12:13 PM

## 2019-12-09 ENCOUNTER — Encounter: Payer: Medicare HMO | Admitting: Physical Therapy

## 2019-12-11 ENCOUNTER — Encounter: Payer: Self-pay | Admitting: Physical Therapy

## 2019-12-11 ENCOUNTER — Ambulatory Visit: Payer: Medicare HMO | Admitting: Physical Therapy

## 2019-12-11 ENCOUNTER — Other Ambulatory Visit: Payer: Self-pay

## 2019-12-11 DIAGNOSIS — M545 Low back pain, unspecified: Secondary | ICD-10-CM

## 2019-12-11 DIAGNOSIS — M6281 Muscle weakness (generalized): Secondary | ICD-10-CM

## 2019-12-11 DIAGNOSIS — G8929 Other chronic pain: Secondary | ICD-10-CM

## 2019-12-11 NOTE — Therapy (Signed)
[facility] [address], [place], [place], [address] Phone: [phone]   Fax:  [phone]  Physical Therapy Treatment  Patient Details  Name: Victoria Levine MRN: [id number] Date of Birth: 1950-06-11 Referring Provider (PT): [name], MD   Encounter Date: 12/11/2019   PT End of Session - 12/11/19 1200    Visit Number 7    Number of Visits 12    Date for PT Re-Evaluation 12/25/19    Authorization Type [employer] approved 12 visits 8/13-10/8    Authorization Time Period 8/13-10/8    Authorization - Visit Number 7    Authorization - Number of Visits 12    Progress Note Due on Visit 10    PT Start Time 1105    PT Stop Time 1147    PT Time Calculation (min) 42 min    Activity Tolerance Patient tolerated treatment well    Behavior During Therapy [facility] for tasks assessed/performed           Past Medical History:  Diagnosis Date   Anemia    low iron   Anxiety    Asthma    Bipolar disorder (HCC)    Chronic back pain    Compression fracture of L2 lumbar vertebra (HCC)    DDD (degenerative disc disease)    Depression    Dyspnea    GERD (gastroesophageal reflux disease)    H/O alcohol abuse    28 years sober   H/O suicide attempt    Herpes    History of hiatal hernia    Hypothyroidism    Migraines    Osteoporosis 11/2018   T score of -2.6   Pneumonia    S/P adenoidectomy    S/P tonsillectomy     Past Surgical History:  Procedure Laterality Date   CESAREAN SECTION     COLONOSCOPY     as a teenager   Compression fracture spine     DILATION AND CURETTAGE OF UTERUS     EYE SURGERY Bilateral    CATARACTS REMOVED, NO LENS PLACED   HYSTEROSCOPY     KYPHOPLASTY N/A 12/19/2016   Procedure: THORACIC 12 KYPHOPLASTY; REQUEST 60 MINS AND FLIP ROOM;  Surgeon: Phylliss Bob, MD;  Location: [place];  Service: Orthopedics;  Laterality: N/A;  THORACIC 12 KYPHOPLASTY; REQUEST  60 MINS AND FLIP ROOM   KYPHOPLASTY N/A 12/11/2017   Procedure: LUMBAR 2 KYPHOPLASTY;  Surgeon: Phylliss Bob, MD;  Location: [place];  Service: Orthopedics;  Laterality: N/A;   LUMBAR FUSION     TONSILLECTOMY      There were no vitals filed for this visit.   Subjective Assessment - 12/11/19 1114    Subjective No more falls but really bad asthma.    Pertinent History PMH: OSTEOPOROSIS, + alcohol use, bipolar, HLD, GERD    Limitations Standing    How long can you stand comfortably? 1-2 min, feel unstable/woozy    Diagnostic tests 2019 xrays and MRI: T12 and L2 compression fractures with kyphoplasty, PLIF L3-L5, L5/S1 severe facet degeneration.    Patient Stated Goals work on balance (standing), tolerate short grocery store trips without having to lay down before unloading    Currently in Pain? Yes    Pain Score 6     Pain Location Back    Pain Orientation Lower    Pain Descriptors / Indicators Aching    Pain Type Chronic pain    Pain Onset More than a month ago  Pain Frequency Constant    Aggravating Factors  standing    Pain Relieving Factors laying down, meds    Effect of Pain on Daily Activities errands                             [facility] Adult PT Treatment/Exercise - 12/11/19 0001      Exercises   Exercises Lumbar;Knee/Hip      Lumbar Exercises: Aerobic   Nustep L2 x 6 PT cued Pt to move with more speed, Pt unable to get higher than 46 SMP, Pt with limited focus and attention to ongoing movement and needed frequent VCs to participate, Pt monitored O2 Sat (93 and 94%, HR 88bpm max)      Lumbar Exercises: Standing   Other Standing Lumbar Exercises tall standing x 30 sec, tall standing with Rt/Lt weight shifts, bil UE support   ongoing trunk, head forward drift and eyes closing     Lumbar Exercises: Seated   Sit to Stand 5 reps    Sit to Stand Limitations from black pad, VCs to show tall controlled posture each rep, control descent      Shoulder  Exercises: Standing   Horizontal ABduction Strengthening;Both;10 reps;Theraband    Theraband Level (Shoulder Horizontal ABduction) Level 1 (Yellow)    Horizontal ABduction Limitations TC/VCs needed throughout for maintenance of desired upright posture.  Close CGA and TCs. Pt with limited ability to follow and repeat instructions    Flexion Strengthening;Both;10 reps;Theraband    Theraband Level (Shoulder Flexion) Level 1 (Yellow)    Flexion Limitations TC/VCs needed throughout for maintenance of desired upright posture.  Close CGA and TCs. Pt with limited ability to follow and repeat instructions    Extension Strengthening;10 reps;Theraband;Both    Theraband Level (Shoulder Extension) Level 1 (Yellow)                    PT Short Term Goals - 11/13/19 1152      PT SHORT TERM GOAL #1   Title Pt will be ind with seated HEP for trunk and LE strength.    Status Achieved      PT SHORT TERM GOAL #2   Title Pt will be ind with standing counter HEP for strength and balance.    Status On-going      PT SHORT TERM GOAL #3   Title Pt will report increased tolerance of standing tasks for up to 10 min before needing to sit for pain and fatigue.    Status On-going      PT SHORT TERM GOAL #4   Title Pt will participate in TUG and DGI to set further LTGs for dynamic balance and endurance.    Status Achieved             PT Long Term Goals - 10/30/19 1200      PT LONG TERM GOAL #1   Title be independent in advanced HEP    Time 8    Period Weeks    Status New    Target Date 12/25/19      PT LONG TERM GOAL #2   Title Pt will improve BERG score to at least 40/56 to demo less fall risk    Baseline 33/56    Time 8    Period Weeks    Status New    Target Date 12/25/19      PT LONG TERM GOAL #3   Title Pt  will perform 5x sit to stand with light use of UEs without balance loss in </= 14 sec.    Time 8    Period Weeks    Status New    Target Date 12/25/19      PT LONG TERM  GOAL #4   Title Pt will report improved tolerance in running short errands for up to 20-30 min before needing to sit or lay down.    Time 8    Period Weeks    Status New    Target Date 12/25/19      PT LONG TERM GOAL #5   Title Pt will achieve at least 4+/5 in bil LEs and trunk for improved tolerance of daily tasks and transfers.    Time 8    Period Weeks    Status New    Target Date 12/25/19                 Plan - 12/11/19 1200    Clinical Impression Statement Pt arrived with significantly flexed posture and unsteadiness on feet with report of asthma flare up.  She demo'd limited participation today needing heavy ongoing VCs/TCs for following basic cues to maintain postures and repetitions of exercises.  She required close supervision and CGA secondary to repeated forward trunk and head drift during seated and standing exercises.  It appeared her eyes were closing at times.  She had some slurred speech.  PT asked Pt if she were under the influence of any drugs, alcohol or had any changes to her medications that could explain this presentation as it is different from her level of participation and communication from first several PT visits.  PT suggested contacting her MD to be assessed and have her meds reviewed as she wonders if she is taking meds at the right time and in the right dosages.  She did note that her psychiatrist had incresed her meds to help her sleep.  Pt plans to review meds today and will return for PT in 3 days.  PT told Pt that if her participation is limited next visit she will be put on hold until she sees her MD to review her overall status.    Comorbidities osteoporosis, falls, past spinal fractures and PLIF    PT Frequency 2x / week    PT Duration 8 weeks    PT Treatment/Interventions ADLs/Self Care Home Management;Cryotherapy;Moist Heat;Traction;Neuromuscular re-education;Balance training;Therapeutic exercise;Therapeutic activities;Functional mobility  training;Stair training;Gait training;Patient/family education;Manual techniques;Passive range of motion;Dry needling;Energy conservation    PT Next Visit Plan screen participation and decide whether to continue PT or put on hold and send back to MD, goals are balance, posture, strength, dynamic gait, endurance    PT Home Exercise Plan Access Code: [email or web address]    Consulted and Agree with Plan of Care Patient           Patient will benefit from skilled therapeutic intervention in order to improve the following deficits and impairments:     Visit Diagnosis: Muscle weakness (generalized)  Chronic midline low back pain without sciatica     Problem List Patient Active Problem List   Diagnosis Date Noted   Intractable nausea and vomiting 08/31/2019   Epigastric pain 08/31/2019   Alcohol abuse 08/31/2019   Microcytic anemia 08/31/2019   Closed compression fracture of L2 vertebra ([place]) 12/11/2017   Pain 07/26/2015   Anxiety state 06/28/2015   Benzodiazepine dependence ([place]) 10/13/2014   Opioid abuse with opioid-induced mood disorder ([place]) 10/13/2014   Bipolar  1 disorder, depressed ([place]) 10/12/2014   Spondylolisthesis of lumbar region 07/28/2014   Osteoporosis, unspecified 11/17/2013   Migraines    Herpes    Hypothyroidism 08/13/2008   HYPERCHOLESTEROLEMIA 08/13/2008   Bipolar disorder ([place]) 08/13/2008   CHRONIC OBSTRUCTIVE ASTHMA UNSPECIFIED 08/13/2008   GERD (gastroesophageal reflux disease) 08/13/2008   HIATAL HERNIA 08/13/2008   ALLERGY 08/13/2008    Dezyre Hoefer, PT 12/11/19 12:11 PM   [facility] [address], [place], [place], [address] Phone: [phone]   Fax:  [phone]  Name: Victoria Levine MRN: [id number] Date of Birth: [birth year]

## 2019-12-14 ENCOUNTER — Ambulatory Visit: Payer: Medicare HMO | Admitting: Physical Therapy

## 2019-12-14 ENCOUNTER — Other Ambulatory Visit: Payer: Self-pay

## 2019-12-14 ENCOUNTER — Encounter: Payer: Self-pay | Admitting: Physical Therapy

## 2019-12-14 DIAGNOSIS — M6281 Muscle weakness (generalized): Secondary | ICD-10-CM | POA: Diagnosis not present

## 2019-12-14 DIAGNOSIS — G8929 Other chronic pain: Secondary | ICD-10-CM

## 2019-12-14 NOTE — Therapy (Signed)
[facility] [address], [place], [place], [address] Phone: [phone]   Fax:  [phone]  Physical Therapy Treatment  Patient Details  Name: Victoria Levine MRN: [id number] Date of Birth: [birth year] Referring Provider (PT): [name], MD   Encounter Date: 12/14/2019   PT End of Session - 12/14/19 0858    Visit Number 8    Number of Visits 12    Date for PT Re-Evaluation 12/25/19    Authorization Type [employer] approved 12 visits 8/13-10/8    Authorization Time Period 8/13-10/8    Authorization - Visit Number 8    Authorization - Number of Visits 12    Progress Note Due on Visit 10    PT Start Time 0850    PT Stop Time 0928    PT Time Calculation (min) 38 min    Activity Tolerance Patient tolerated treatment well    Behavior During Therapy [facility] for tasks assessed/performed           Past Medical History:  Diagnosis Date  . Anemia    low iron  . Anxiety   . Asthma   . Bipolar disorder ([place])   . Chronic back pain   . Compression fracture of L2 lumbar vertebra (HCC)   . DDD (degenerative disc disease)   . Depression   . Dyspnea   . GERD (gastroesophageal reflux disease)   . H/O alcohol abuse    28 years sober  . H/O suicide attempt   . Herpes   . History of hiatal hernia   . Hypothyroidism   . Migraines   . Osteoporosis 11/2018   T score of -2.6  . Pneumonia   . S/P adenoidectomy   . S/P tonsillectomy     Past Surgical History:  Procedure Laterality Date  . CESAREAN SECTION    . COLONOSCOPY     as a teenager  . Compression fracture spine    . DILATION AND CURETTAGE OF UTERUS    . EYE SURGERY Bilateral    CATARACTS REMOVED, NO LENS PLACED  . HYSTEROSCOPY    . KYPHOPLASTY N/A 12/19/2016   Procedure: THORACIC 12 KYPHOPLASTY; REQUEST 60 MINS AND FLIP ROOM;  Surgeon: Phylliss Bob, MD;  Location: [place];  Service: Orthopedics;  Laterality: N/A;  THORACIC 12 KYPHOPLASTY; REQUEST  60 MINS AND FLIP ROOM  . KYPHOPLASTY N/A 12/11/2017   Procedure: LUMBAR 2 KYPHOPLASTY;  Surgeon: Phylliss Bob, MD;  Location: [place];  Service: Orthopedics;  Laterality: N/A;  . LUMBAR FUSION    . TONSILLECTOMY      There were no vitals filed for this visit.   Subjective Assessment - 12/14/19 0856    Subjective Pt feels much better today and since Fri.  Pt says she reviewed her meds and couldn't figure out what would have been her problem on Friday.    Pertinent History PMH: OSTEOPOROSIS, + alcohol use, bipolar, HLD, GERD    Limitations Standing    How long can you stand comfortably? 1-2 min, feel unstable/woozy    Diagnostic tests 2019 xrays and MRI: T12 and L2 compression fractures with kyphoplasty, PLIF L3-L5, L5/S1 severe facet degeneration.    Patient Stated Goals work on balance (standing), tolerate short grocery store trips without having to lay down before unloading    Currently in Pain? Yes    Pain Score 4     Pain Location Back    Pain Orientation Lower    Pain Descriptors / Indicators  Aching    Pain Type Chronic pain    Pain Onset More than a month ago    Pain Frequency Constant    Aggravating Factors  standing    Pain Relieving Factors meds    Effect of Pain on Daily Activities errands                             [facility] Adult PT Treatment/Exercise - 12/14/19 0001      Self-Care   Self-Care Other Self-Care Comments    Other Self-Care Comments  encouraged Pt to track meds in a log in case of another medical set back she will have this to show her MD, holds Pt accountable for cross checking labels and dosages as she tracks meds at intake      Exercises   Exercises Lumbar;Knee/Hip;Shoulder      Lumbar Exercises: Aerobic   Recumbent Bike L1 x 6', PT present to discuss symptoms      Lumbar Exercises: Seated   Other Seated Lumbar Exercises seated deadlift yellow band bil UEs with band under feet x 10 reps      Knee/Hip Exercises: Seated   Clamshell  with TheraBand Yellow   1x15   Sit to Sand 3 sets;5 reps   yellow clamshell band for hip abd cue, intermit UEs on thigh     Shoulder Exercises: Seated   Horizontal ABduction Strengthening;Both;10 reps;Theraband    Theraband Level (Shoulder Horizontal ABduction) Level 1 (Yellow)               Balance Exercises - 12/14/19 0001      Balance Exercises: Standing   Standing Eyes Opened Narrow base of support (BOS);30 secs;2 reps;Head turns   limited by back pain   Step Over Hurdles / Cones cone weaving and pool noodle step overs x 3 laps 20' length down/back x 3 cycles, CGA with gait belt, PT VCs for heel strike with step overs and wider BOS during turns around cones    Heel Raises Both;20 reps    Toe Raise Both;20 reps    Other Standing Exercises rebounder weight shifts side/side, stagger stance fwd/bwd each x 1' each               PT Short Term Goals - 11/13/19 1152      PT SHORT TERM GOAL #1   Title Pt will be ind with seated HEP for trunk and LE strength.    Status Achieved      PT SHORT TERM GOAL #2   Title Pt will be ind with standing counter HEP for strength and balance.    Status On-going      PT SHORT TERM GOAL #3   Title Pt will report increased tolerance of standing tasks for up to 10 min before needing to sit for pain and fatigue.    Status On-going      PT SHORT TERM GOAL #4   Title Pt will participate in TUG and DGI to set further LTGs for dynamic balance and endurance.    Status Achieved             PT Long Term Goals - 10/30/19 1200      PT LONG TERM GOAL #1   Title be independent in advanced HEP    Time 8    Period Weeks    Status New    Target Date 12/25/19      PT LONG TERM GOAL #  2   Title Pt will improve BERG score to at least 40/56 to demo less fall risk    Baseline 33/56    Time 8    Period Weeks    Status New    Target Date 12/25/19      PT LONG TERM GOAL #3   Title Pt will perform 5x sit to stand with light use of UEs without  balance loss in </= 14 sec.    Time 8    Period Weeks    Status New    Target Date 12/25/19      PT LONG TERM GOAL #4   Title Pt will report improved tolerance in running short errands for up to 20-30 min before needing to sit or lay down.    Time 8    Period Weeks    Status New    Target Date 12/25/19      PT LONG TERM GOAL #5   Title Pt will achieve at least 4+/5 in bil LEs and trunk for improved tolerance of daily tasks and transfers.    Time 8    Period Weeks    Status New    Target Date 12/25/19                 Plan - 12/14/19 1112    Clinical Impression Statement Pt arrived with presentation closer to baseline from initial several appointments today, feeling significantly better from Friday.  She said she slept 5 hours and felt improved afterwards.  She reviewed her meds to ensure proper dosage and timing.  PT encouraged her to track this in a log and cross check medications with labels each time she takes them.  Pt demos challenge with heel strike with step overs and some LOB with cone weaving today secondary to narrow BOS.  She was able to perform standing balance challenges for approx 5-8 min before needing seated break for back pain which is an improvement.  Pt will continue to benefit from skilled PT with focus on LE and trunk strength, postural stabilization, dynamic gait and balance with ongoing monitoring of symptoms.    Comorbidities osteoporosis, falls, past spinal fractures and PLIF    Rehab Potential Good    PT Frequency 2x / week    PT Duration 8 weeks    PT Treatment/Interventions ADLs/Self Care Home Management;Cryotherapy;Moist Heat;Traction;Neuromuscular re-education;Balance training;Therapeutic exercise;Therapeutic activities;Functional mobility training;Stair training;Gait training;Patient/family education;Manual techniques;Passive range of motion;Dry needling;Energy conservation    PT Next Visit Plan continue static and dynamic gait/balance, LE and trunk  strength    PT Home Exercise Plan Access Code: [email or web address]    Consulted and Agree with Plan of Care Patient           Patient will benefit from skilled therapeutic intervention in order to improve the following deficits and impairments:     Visit Diagnosis: Muscle weakness (generalized)  Chronic midline low back pain without sciatica     Problem List Patient Active Problem List   Diagnosis Date Noted  . Intractable nausea and vomiting 08/31/2019  . Epigastric pain 08/31/2019  . Alcohol abuse 08/31/2019  . Microcytic anemia 08/31/2019  . Closed compression fracture of L2 vertebra ([place]) 12/11/2017  . Pain 07/26/2015  . Anxiety state 06/28/2015  . Benzodiazepine dependence ([place]) 10/13/2014  . Opioid abuse with opioid-induced mood disorder ([place]) 10/13/2014  . Bipolar 1 disorder, depressed ([place]) 10/12/2014  . Spondylolisthesis of lumbar region 07/28/2014  . Osteoporosis, unspecified 11/17/2013  . Migraines   .  Herpes   . Hypothyroidism 08/13/2008  . HYPERCHOLESTEROLEMIA 08/13/2008  . Bipolar disorder ([place]) 08/13/2008  . CHRONIC OBSTRUCTIVE ASTHMA UNSPECIFIED 08/13/2008  . GERD (gastroesophageal reflux disease) 08/13/2008  . HIATAL HERNIA 08/13/2008  . ALLERGY 08/13/2008    Baruch Merl, PT 12/14/19 11:17 AM   [facility] [address], [place], [place], [address] Phone: [phone]   Fax:  [phone]  Name: LAVERLE PILLARD MRN: [id number] Date of Birth: January 23, 1951

## 2019-12-18 ENCOUNTER — Ambulatory Visit: Payer: Medicare HMO | Admitting: Physical Therapy

## 2019-12-18 ENCOUNTER — Emergency Department (HOSPITAL_COMMUNITY)
Admission: EM | Admit: 2019-12-18 | Discharge: 2019-12-18 | Disposition: A | Discharge status: Home / Self Care | Payer: Medicare HMO | Attending: Emergency Medicine | Admitting: Emergency Medicine

## 2019-12-18 ENCOUNTER — Other Ambulatory Visit: Payer: Self-pay

## 2019-12-18 ENCOUNTER — Encounter (HOSPITAL_COMMUNITY): Payer: Self-pay

## 2019-12-18 DIAGNOSIS — Z79899 Other long term (current) drug therapy: Secondary | ICD-10-CM | POA: Diagnosis not present

## 2019-12-18 DIAGNOSIS — Z7951 Long term (current) use of inhaled steroids: Secondary | ICD-10-CM | POA: Insufficient documentation

## 2019-12-18 DIAGNOSIS — J449 Chronic obstructive pulmonary disease, unspecified: Secondary | ICD-10-CM | POA: Diagnosis not present

## 2019-12-18 DIAGNOSIS — R110 Nausea: Secondary | ICD-10-CM | POA: Insufficient documentation

## 2019-12-18 DIAGNOSIS — R1013 Epigastric pain: Secondary | ICD-10-CM | POA: Diagnosis not present

## 2019-12-18 DIAGNOSIS — R1010 Upper abdominal pain, unspecified: Secondary | ICD-10-CM | POA: Diagnosis present

## 2019-12-18 DIAGNOSIS — E039 Hypothyroidism, unspecified: Secondary | ICD-10-CM | POA: Diagnosis not present

## 2019-12-18 LAB — COMPREHENSIVE METABOLIC PANEL
ALT: 12 U/L (ref 0–44)
AST: 16 U/L (ref 15–41)
Albumin: 4.2 g/dL (ref 3.5–5.0)
Alkaline Phosphatase: 89 U/L (ref 38–126)
Anion gap: 12 (ref 5–15)
BUN: 11 mg/dL (ref 8–23)
CO2: 24 mmol/L (ref 22–32)
Calcium: 9.9 mg/dL (ref 8.9–10.3)
Chloride: 97 mmol/L — ABNORMAL LOW (ref 98–111)
Creatinine, Ser: 0.71 mg/dL (ref 0.44–1.00)
GFR calc Af Amer: >60 mL/min (ref >60)
GFR calc non Af Amer: >60 mL/min (ref >60)
Glucose, Bld: 117 mg/dL — ABNORMAL HIGH (ref 70–99)
Potassium: 3.9 mmol/L (ref 3.5–5.1)
Sodium: 133 mmol/L — ABNORMAL LOW (ref 135–145)
Total Bilirubin: 0.9 mg/dL (ref 0.3–1.2)
Total Protein: 7.5 g/dL (ref 6.5–8.1)

## 2019-12-18 LAB — CBC
HCT: 40.1 % (ref 36.0–46.0)
Hemoglobin: 12.5 g/dL (ref 12.0–15.0)
MCH: 26.8 pg (ref 26.0–34.0)
MCHC: 31.2 g/dL (ref 30.0–36.0)
MCV: 85.9 fL (ref 80.0–100.0)
Platelets: 391 10*3/uL (ref 150–400)
RBC: 4.67 MIL/uL (ref 3.87–5.11)
RDW: 14.6 % (ref 11.5–15.5)
WBC: 8.8 10*3/uL (ref 4.0–10.5)
nRBC: 0 % (ref 0.0–0.2)

## 2019-12-18 LAB — LIPASE, BLOOD: Lipase: 26 U/L (ref 11–51)

## 2019-12-18 MED ORDER — DICYCLOMINE HCL 10 MG PO CAPS
20.0000 mg | ORAL_CAPSULE | Freq: Once | ORAL | Status: AC
  Administered 2019-12-18: 20 mg via ORAL
  Filled 2019-12-18: qty 2

## 2019-12-18 MED ORDER — ONDANSETRON HCL 4 MG/2ML IJ SOLN
4.0000 mg | Freq: Once | INTRAMUSCULAR | Status: AC
  Administered 2019-12-18: 4 mg via INTRAVENOUS
  Filled 2019-12-18: qty 2

## 2019-12-18 MED ORDER — DICYCLOMINE HCL 20 MG PO TABS
20.0000 mg | ORAL_TABLET | Freq: Two times a day (BID) | ORAL | 0 refills | Status: DC
Start: 2019-12-18 — End: 2019-12-31

## 2019-12-18 MED ORDER — SUCRALFATE 1 G PO TABS
1.0000 g | ORAL_TABLET | Freq: Three times a day (TID) | ORAL | 0 refills | Status: DC
Start: 2019-12-18 — End: 2021-01-15

## 2019-12-18 MED ORDER — ONDANSETRON 4 MG PO TBDP
4.0000 mg | ORAL_TABLET | Freq: Three times a day (TID) | ORAL | 0 refills | Status: DC | PRN
Start: 2019-12-18 — End: 2021-01-15

## 2019-12-18 NOTE — ED Notes (Signed)
Pt ambulatory to restroom

## 2019-12-18 NOTE — ED Triage Notes (Addendum)
Per EMS-coming from home-hiatal hernia for 2 months-is being seen by GI-appointment for endoscopy in 2 weeks-having upper abdominal cramping-patient has been using someone else's Xanax to help relive discomfort-here for pain management

## 2019-12-18 NOTE — ED Notes (Signed)
Pt attempted to void, unable to at this time

## 2019-12-18 NOTE — ED Provider Notes (Signed)
[place] DEPT Provider Note   CSN: [id number] Arrival date & time: 12/18/19  7124     History Chief Complaint  Patient presents with  . Abdominal Pain    Victoria Levine is a 69 y.o. female.  HPI      Victoria Levine is a 69 y.o. female, with a history of anemia, anxiety, asthma, bipolar, GERD, chronic pain, presenting to the ED with upper abdominal pain for the last couple months.    Patient states her pain has continued, but not worsened. She comes to the ED because she was supposed to have an appointment with her GI specialist, Dr. Paulita Fujita, yesterday, but canceled the appointment because she did not have a ride home from the appointment.  She was rescheduled for October 27. She states she has been taking a friend's Xanax and indicates this helped her abdominal cramping. She also has nausea.  Denies fever/chills, vomiting, diarrhea, constipation, hematochezia/melena, chest pain, shortness of breath, neurologic deficits, urinary symptoms, or any other complaints.  Past Medical History:  Diagnosis Date  . Anemia    low iron  . Anxiety   . Asthma   . Bipolar disorder ([place])   . Chronic back pain   . Compression fracture of L2 lumbar vertebra (HCC)   . DDD (degenerative disc disease)   . Depression   . Dyspnea   . GERD (gastroesophageal reflux disease)   . H/O alcohol abuse    28 years sober  . H/O suicide attempt   . Herpes   . History of hiatal hernia   . Hypothyroidism   . Migraines   . Osteoporosis 11/2018   T score of -2.6  . Pneumonia   . S/P adenoidectomy   . S/P tonsillectomy     Patient Active Problem List   Diagnosis Date Noted  . Intractable nausea and vomiting 08/31/2019  . Epigastric pain 08/31/2019  . Alcohol abuse 08/31/2019  . Microcytic anemia 08/31/2019  . Closed compression fracture of L2 vertebra ([place]) 12/11/2017  . Pain 07/26/2015  . Anxiety state 06/28/2015  . Benzodiazepine dependence ([place])  10/13/2014  . Opioid abuse with opioid-induced mood disorder ([place]) 10/13/2014  . Bipolar 1 disorder, depressed ([place]) 10/12/2014  . Spondylolisthesis of lumbar region 07/28/2014  . Osteoporosis, unspecified 11/17/2013  . Migraines   . Herpes   . Hypothyroidism 08/13/2008  . HYPERCHOLESTEROLEMIA 08/13/2008  . Bipolar disorder ([place]) 08/13/2008  . CHRONIC OBSTRUCTIVE ASTHMA UNSPECIFIED 08/13/2008  . GERD (gastroesophageal reflux disease) 08/13/2008  . HIATAL HERNIA 08/13/2008  . ALLERGY 08/13/2008    Past Surgical History:  Procedure Laterality Date  . CESAREAN SECTION    . COLONOSCOPY     as a teenager  . Compression fracture spine    . DILATION AND CURETTAGE OF UTERUS    . EYE SURGERY Bilateral    CATARACTS REMOVED, NO LENS PLACED  . HYSTEROSCOPY    . KYPHOPLASTY N/A 12/19/2016   Procedure: THORACIC 12 KYPHOPLASTY; REQUEST 60 MINS AND FLIP ROOM;  Surgeon: Phylliss Bob, MD;  Location: [place];  Service: Orthopedics;  Laterality: N/A;  THORACIC 12 KYPHOPLASTY; REQUEST 60 MINS AND FLIP ROOM  . KYPHOPLASTY N/A 12/11/2017   Procedure: LUMBAR 2 KYPHOPLASTY;  Surgeon: Phylliss Bob, MD;  Location: [place];  Service: Orthopedics;  Laterality: N/A;  . LUMBAR FUSION    . TONSILLECTOMY       OB History    Gravida  3   Para  1   Term  1  Preterm      AB  2   Living  1     SAB      TAB      Ectopic      Multiple      Live Births              Family History  Adopted: Yes    Social History   Tobacco Use  . Smoking status: Never Smoker  . Smokeless tobacco: Never Used  Vaping Use  . Vaping Use: Never used  Substance Use Topics  . Alcohol use: Yes    Alcohol/week: 0.0 standard drinks  . Drug use: Not Currently    Types: Benzodiazepines, Cocaine    Home Medications Prior to Admission medications   Medication Sig Start Date End Date Taking? Authorizing Provider  albuterol (PROVENTIL HFA;VENTOLIN HFA) 108 (90 Base) MCG/ACT inhaler Inhale 2 puffs into the  lungs every 6 (six) hours as needed for wheezing or shortness of breath.    [provider]  buPROPion (WELLBUTRIN XL) 150 MG 24 hr tablet Take 300 mg by mouth daily.  10/01/16   [provider]  calcium citrate-vitamin D (CALCIUM + D) 315-200 MG-UNIT per tablet Take 1 tablet by mouth daily. For bone health 10/18/14   Lindell Spar I, NP  chlordiazePOXIDE (LIBRIUM) 25 MG capsule 50mg  PO TID x 1D, then 25-50mg  PO BID X 1D, then 25-50mg  PO QD X 1D 05/08/19   Petrucelli, Samantha R, PA-C  cyclobenzaprine (FLEXERIL) 10 MG tablet Take 10 mg by mouth 3 (three) times daily as needed for muscle spasms.     [provider]  denosumab (PROLIA) 60 MG/ML SOLN injection Inject 60 mg into the skin every 6 (six) months. Administer in upper arm, thigh, or abdomen  NEXT IS DUE 03-2017 Patient not taking: Reported on 11/19/2018 03/05/17   [name], NP  EPINEPHrine 0.3 mg/0.3 mL IJ SOAJ injection Inject 0.3 mg into the skin as needed for anaphylaxis. 04/10/19   [provider]  famotidine (PEPCID) 20 MG tablet Take 1 tablet (20 mg total) by mouth at bedtime. 09/03/19 10/03/19  Damita Lack, MD  ferrous sulfate 325 (65 FE) MG tablet Take 1 tablet (325 mg total) by mouth 2 (two) times daily with a meal. 09/03/19 10/03/19  Damita Lack, MD  FLOVENT [facility] 110 MCG/ACT inhaler  07/15/19   [provider]  FLUoxetine (PROZAC) 20 MG capsule Take 20 mg by mouth 2 (two) times daily.     [provider]  gabapentin (NEURONTIN) 600 MG tablet Take 600 mg by mouth 3 (three) times daily.  11/05/16   [provider]  levothyroxine (SYNTHROID, LEVOTHROID) 112 MCG tablet Take 112 mcg by mouth daily before breakfast.  09/04/17   [provider]  methocarbamol (ROBAXIN) 500 MG tablet Take 500 mg by mouth 2 (two) times daily. 03/11/19   [provider]  Multiple Vitamin (MULTIVITAMIN) tablet Take 1 tablet by mouth daily. For low vitamin 10/18/14   Lindell Spar  I, NP  naproxen sodium (ALEVE) 220 MG tablet Take 220-440 mg by mouth 2 (two) times daily as needed (for pain).    [provider]  ondansetron (ZOFRAN ODT) 4 MG disintegrating tablet Take 1 tablet (4 mg total) by mouth every 8 (eight) hours as needed for nausea or vomiting. 12/18/19   Gelila Well C, PA-C  ondansetron (ZOFRAN) 8 MG tablet Take 8 mg by mouth every 8 (eight) hours as needed for nausea  or vomiting (Migraine Head Ache symptoms).    [provider]  oxcarbazepine (TRILEPTAL) 600 MG tablet Take 600 mg by mouth 2 (two) times daily.     [provider]  OxyCODONE ER (XTAMPZA ER) 13.5 MG C12A Take 13.5 mg by mouth 2 (two) times daily. 11/22/16   [provider]  Oxycodone HCl 10 MG TABS Take 10 mg by mouth daily as needed (for breakthrough pain).  09/16/17   [provider]  pantoprazole (PROTONIX) 40 MG tablet Take 1 tablet (40 mg total) by mouth 2 (two) times daily before a meal. 09/03/19   Amin, Jeanella Flattery, MD  polyethylene glycol (MIRALAX / GLYCOLAX) 17 g packet Take 17 g by mouth daily as needed for moderate constipation or severe constipation. 09/03/19   Amin, Jeanella Flattery, MD  QUEtiapine (SEROQUEL) 100 MG tablet Take 100-200 mg by mouth at bedtime.  10/12/16   [provider]  rizatriptan (MAXALT-MLT) 10 MG disintegrating tablet Take 10 mg by mouth as needed for migraine.  11/08/14   [provider]  sucralfate (CARAFATE) 1 g tablet Take 1 tablet (1 g total) by mouth 4 (four) times daily -  with meals and at bedtime. 12/18/19 01/17/20  Broady Lafoy C, PA-C  valACYclovir (VALTREX) 500 MG tablet Take one tablet twice daily 3-5 days then daily as needed. 05/01/17   [name], NP    Allergies    Prednisone, Lithium carbonate, and Sulfa antibiotics  Review of Systems   Review of Systems  Constitutional: Negative for chills, diaphoresis and fever.  Respiratory: Negative for cough and shortness of breath.   Cardiovascular: Negative  for chest pain.  Gastrointestinal: Positive for abdominal pain and nausea. Negative for blood in stool, constipation, diarrhea and vomiting.  Genitourinary: Negative for difficulty urinating and dysuria.  Musculoskeletal: Negative for back pain (none acute).  Neurological: Negative for dizziness, syncope, weakness and numbness.  All other systems reviewed and are negative.   Physical Exam Updated Vital Signs BP (!) 162/94   Pulse 69   Temp 98.6 F (37 C) (Oral)   Resp 14   Ht 5\' 5"  (1.651 m)   Wt 70.3 kg   SpO2 99%   BMI 25.79 kg/m   Physical Exam Vitals and nursing note reviewed.  Constitutional:      General: She is not in acute distress.    Appearance: She is well-developed. She is not diaphoretic.  HENT:     Head: Normocephalic and atraumatic.     Mouth/Throat:     Mouth: Mucous membranes are moist.     Pharynx: Oropharynx is clear.  Eyes:     Conjunctiva/sclera: Conjunctivae normal.  Cardiovascular:     Rate and Rhythm: Normal rate and regular rhythm.     Pulses: Normal pulses.          Radial pulses are 2+ on the right side and 2+ on the left side.       Posterior tibial pulses are 2+ on the right side and 2+ on the left side.     Heart sounds: Normal heart sounds.     Comments: Tactile temperature in the extremities appropriate and equal bilaterally. Pulmonary:     Effort: Pulmonary effort is normal. No respiratory distress.     Breath sounds: Normal breath sounds.  Abdominal:     Palpations: Abdomen is soft.     Tenderness: There is abdominal tenderness in the epigastric area. There is no guarding.  Musculoskeletal:  Cervical back: Neck supple.     Right lower leg: No edema.     Left lower leg: No edema.  Lymphadenopathy:     Cervical: No cervical adenopathy.  Skin:    General: Skin is warm and dry.  Neurological:     Mental Status: She is alert.  Psychiatric:        Mood and Affect: Mood and affect normal.        Speech: Speech normal.         Behavior: Behavior normal.     ED Results / Procedures / Treatments   Labs (all labs ordered are listed, but only abnormal results are displayed) Labs Reviewed  COMPREHENSIVE METABOLIC PANEL - Abnormal; Notable for the following components:      Result Value   Sodium 133 (*)    Chloride 97 (*)    Glucose, Bld 117 (*)    All other components within normal limits  LIPASE, BLOOD  CBC  URINALYSIS, ROUTINE W REFLEX MICROSCOPIC    EKG None  Radiology No results found.  Procedures Procedures (including critical care time)  Medications Ordered in ED Medications  dicyclomine (BENTYL) capsule 20 mg (20 mg Oral Given 12/18/19 1343)  ondansetron (ZOFRAN) injection 4 mg (4 mg Intravenous Given 12/18/19 1343)    ED Course  I have reviewed the triage vital signs and the nursing notes.  Pertinent labs & imaging results that were available during my care of the patient were reviewed by me and considered in my medical decision making (see chart for details).    MDM Rules/Calculators/A&P                          Patient presents with continued upper abdominal pain. Patient is nontoxic appearing, afebrile, not tachycardic, not tachypneic, not hypotensive, maintains excellent SPO2 on room air.  I have reviewed the patient's chart to obtain more information.   I reviewed and interpreted the patient's labs.  Further work-up here in the ED, including imaging studies, recommended to the patient along with explanation.  Patient declined stating she "just wants medications." Patient advised to call Dr. Erlinda Hong office for any further management.  Findings and plan of care discussed with Quintella Reichert, MD. Dr. Ralene Bathe personally evaluated and examined this patient.   Vitals:   12/18/19 1245 12/18/19 1300 12/18/19 1315 12/18/19 1330  BP: 136/83 127/72  (!) 174/109  Pulse: 66 65 68 77  Resp: 13 14 17 15   Temp:      TempSrc:      SpO2: 97% 95% 99% 99%  Weight:      Height:          Final Clinical Impression(s) / ED Diagnoses Final diagnoses:  Epigastric pain    Rx / DC Orders ED Discharge Orders         Ordered    ondansetron (ZOFRAN ODT) 4 MG disintegrating tablet  Every 8 hours PRN        12/18/19 1409    sucralfate (CARAFATE) 1 g tablet  3 times daily with meals & bedtime        12/18/19 1409           Lorayne Bender, PA-C 12/18/19 1415    Quintella Reichert, MD 12/18/19 1538

## 2019-12-18 NOTE — Discharge Instructions (Signed)
  Nausea/vomiting: Use the ondansetron (generic for Zofran) for nausea or vomiting.  This medication may not prevent all vomiting or nausea, but can help facilitate better hydration. Things that can help with nausea/vomiting also include peppermint/menthol candies, vitamin B12, and ginger. Sucralfate: Sucralfate (generic for Carafate) is meant to soothe symptoms of abdominal discomfort and reflux.

## 2019-12-22 ENCOUNTER — Encounter: Payer: Self-pay | Admitting: Physical Therapy

## 2019-12-22 ENCOUNTER — Ambulatory Visit: Payer: Medicare HMO | Attending: Family Medicine | Admitting: Physical Therapy

## 2019-12-22 ENCOUNTER — Other Ambulatory Visit: Payer: Self-pay

## 2019-12-22 DIAGNOSIS — G8929 Other chronic pain: Secondary | ICD-10-CM | POA: Diagnosis present

## 2019-12-22 DIAGNOSIS — M6281 Muscle weakness (generalized): Secondary | ICD-10-CM

## 2019-12-22 DIAGNOSIS — M545 Low back pain, unspecified: Secondary | ICD-10-CM | POA: Insufficient documentation

## 2019-12-22 NOTE — Therapy (Addendum)
[facility] [address] [place], [place], [address] Phone: [phone]   Fax:  [phone]  Physical Therapy Treatment  Patient Details  Name: Victoria Levine MRN: [id number] Date of Birth: 1950-04-09 Referring Provider (PT): [name], MD   Encounter Date: 12/22/2019   PT End of Session - 12/22/19 1157    Visit Number 9    Number of Visits 12    Date for PT Re-Evaluation 12/25/19    Authorization Type [employer] approved 12 visits 8/13-10/8    Authorization Time Period 8/13-10/8    Authorization - Visit Number 9    Authorization - Number of Visits 12    Progress Note Due on Visit 10    PT Start Time 1155   Pt late   PT Stop Time 1230    PT Time Calculation (min) 35 min    Activity Tolerance Patient tolerated treatment well    Behavior During Therapy [facility] for tasks assessed/performed           Past Medical History:  Diagnosis Date  . Anemia    low iron  . Anxiety   . Asthma   . Bipolar disorder ([place])   . Chronic back pain   . Compression fracture of L2 lumbar vertebra (HCC)   . DDD (degenerative disc disease)   . Depression   . Dyspnea   . GERD (gastroesophageal reflux disease)   . H/O alcohol abuse    28 years sober  . H/O suicide attempt   . Herpes   . History of hiatal hernia   . Hypothyroidism   . Migraines   . Osteoporosis 11/2018   T score of -2.6  . Pneumonia   . S/P adenoidectomy   . S/P tonsillectomy     Past Surgical History:  Procedure Laterality Date  . CESAREAN SECTION    . COLONOSCOPY     as a teenager  . Compression fracture spine    . DILATION AND CURETTAGE OF UTERUS    . EYE SURGERY Bilateral    CATARACTS REMOVED, NO LENS PLACED  . HYSTEROSCOPY    . KYPHOPLASTY N/A 12/19/2016   Procedure: THORACIC 12 KYPHOPLASTY; REQUEST 60 MINS AND FLIP ROOM;  Surgeon: Phylliss Bob, MD;  Location: [place];  Service: Orthopedics;  Laterality: N/A;  THORACIC 12 KYPHOPLASTY;  REQUEST 60 MINS AND FLIP ROOM  . KYPHOPLASTY N/A 12/11/2017   Procedure: LUMBAR 2 KYPHOPLASTY;  Surgeon: Phylliss Bob, MD;  Location: [place];  Service: Orthopedics;  Laterality: N/A;  . LUMBAR FUSION    . TONSILLECTOMY      There were no vitals filed for this visit.   Subjective Assessment - 12/22/19 1156    Subjective Pt had some abdominal pain and went to ED since last here.  Otherwise is feeling ok.    Pertinent History PMH: OSTEOPOROSIS, + alcohol use, bipolar, HLD, GERD    How long can you stand comfortably? 1-2 min, feel unstable/woozy    Diagnostic tests 2019 xrays and MRI: T12 and L2 compression fractures with kyphoplasty, PLIF L3-L5, L5/S1 severe facet degeneration.    Patient Stated Goals work on balance (standing), tolerate short grocery store trips without having to lay down before unloading    Currently in Pain? No/denies    Aggravating Factors  standing    Pain Relieving Factors meds    Effect of Pain on Daily Activities errands              [facility]  PT Assessment - 12/22/19 0001      Strength   Overall Strength Comments knee flexion 4-/5 all other hips and knee ext 4-4+/5    Strength Assessment Site Knee;Hip    Right/Left Hip Right;Left    Right Hip Flexion 4+/5    Right Hip Extension 4/5    Right Hip External Rotation  4/5    Right Hip Internal Rotation 4/5    Right Hip ABduction 4/5    Right Hip ADduction 4/5    Left Hip Flexion 4+/5    Left Hip Extension 4/5    Left Hip External Rotation 4/5    Left Hip Internal Rotation 4/5    Left Hip ABduction 4/5    Left Hip ADduction 4/5    Right/Left Knee Right;Left    Right Knee Flexion 4-/5    Right Knee Extension 4+/5    Left Knee Flexion 4-/5    Left Knee Extension 4+/5                         [facility] Adult PT Treatment/Exercise - 12/22/19 0001      Exercises   Exercises Lumbar;Knee/Hip;Shoulder      Lumbar Exercises: Aerobic   Recumbent Bike L2 x 7' PT present to review goals       Knee/Hip Exercises: Seated   Long Arc Quad Strengthening;Both;2 sets;10 reps    Long Arc Quad Limitations red band around ankles    Ball Squeeze 10x5 sec holds    Knee/Hip Flexion knee flexion red band around ankles seated on black pad 2x10    Abd/Adduction Limitations hip abd blue band 1x15                    PT Short Term Goals - 12/22/19 1158      PT SHORT TERM GOAL #1   Title Pt will be ind with seated HEP for trunk and LE strength.    Baseline every other day    Status Achieved      PT SHORT TERM GOAL #2   Title Pt will be ind with standing counter HEP for strength and balance.    Status Achieved      PT SHORT TERM GOAL #3   Title Pt will report increased tolerance of standing tasks for up to 10 min before needing to sit for pain and fatigue.    Baseline 2-3 min    Status Partially Met      PT SHORT TERM GOAL #4   Title Pt will participate in TUG and DGI to set further LTGs for dynamic balance and endurance.    Status Achieved             PT Long Term Goals - 12/22/19 1159      PT LONG TERM GOAL #1   Title be independent in advanced HEP    Status On-going      PT LONG TERM GOAL #2   Title Pt will improve BERG score to at least 40/56 to demo less fall risk    Status On-going      PT LONG TERM GOAL #3   Title Pt will perform 5x sit to stand with light use of UEs without balance loss in </= 14 sec.    Baseline 13 sec      PT LONG TERM GOAL #4   Title Pt will report improved tolerance in running short errands for up to 20-30 min before needing to   sit or lay down.    Baseline hangs on cart to tolerate short errands    Status On-going      PT LONG TERM GOAL #5   Title Pt will achieve at least 4+/5 in bil LEs and trunk for improved tolerance of daily tasks and transfers.    Status On-going                 Plan - 12/22/19 1229    Clinical Impression Statement Pt is making good progress on strength and transfers from sit to stand.  She met 5x  sit to stand today performing in 13 sec.  Strength in bil LEs has improved in all muscle groups to 4/5 or 4+/5 with exception of bil knee flexion which remains 4-/5.  PT advanced HEP today to include resistance bands for hips and hamstrings. Pt needed VCs to slow ther ex vs use momentum.  She was able to perform some sit to stands without UE assist today.  PT is pleased to find improved strength and will further assess balance progress next visit.  Anticpate re-new PT next time as Pt is making steady progress toward goals.    Comorbidities osteoporosis, falls, past spinal fractures and PLIF    Rehab Potential Good    PT Frequency 2x / week    PT Duration 8 weeks    PT Treatment/Interventions ADLs/Self Care Home Management;Cryotherapy;Moist Heat;Traction;Neuromuscular re-education;Balance training;Therapeutic exercise;Therapeutic activities;Functional mobility training;Stair training;Gait training;Patient/family education;Manual techniques;Passive range of motion;Dry needling;Energy conservation    PT Next Visit Plan ERO, check BERG, balance, gait function    PT Home Exercise Plan Access Code: [email or web address]    Consulted and Agree with Plan of Care Patient           Patient will benefit from skilled therapeutic intervention in order to improve the following deficits and impairments:     Visit Diagnosis: Muscle weakness (generalized)  Chronic midline low back pain without sciatica     Problem List Patient Active Problem List   Diagnosis Date Noted  . Intractable nausea and vomiting 08/31/2019  . Epigastric pain 08/31/2019  . Alcohol abuse 08/31/2019  . Microcytic anemia 08/31/2019  . Closed compression fracture of L2 vertebra (HCC) 12/11/2017  . Pain 07/26/2015  . Anxiety state 06/28/2015  . Benzodiazepine dependence (HCC) 10/13/2014  . Opioid abuse with opioid-induced mood disorder (HCC) 10/13/2014  . Bipolar 1 disorder, depressed (HCC) 10/12/2014  . Spondylolisthesis of lumbar region  07/28/2014  . Osteoporosis, unspecified 11/17/2013  . Migraines   . Herpes   . Hypothyroidism 08/13/2008  . HYPERCHOLESTEROLEMIA 08/13/2008  . Bipolar disorder (HCC) 08/13/2008  . CHRONIC OBSTRUCTIVE ASTHMA UNSPECIFIED 08/13/2008  . GERD (gastroesophageal reflux disease) 08/13/2008  . HIATAL HERNIA 08/13/2008  . ALLERGY 08/13/2008     , PT 12/22/19 12:33 PM  PHYSICAL THERAPY DISCHARGE SUMMARY  Visits from Start of Care: 9  Current functional level related to goals / functional outcomes: Pt missed two appointments and her certification has now expired.  She will need a new PT Rx to re-instate PT.   Remaining deficits: See above   Education / Equipment: HEP Plan: Patient agrees to discharge.  Patient goals were partially met. Patient is being discharged due to not returning since the last visit.  ?????          , PT 12/30/19 1:03 PM   [facility] [address], [address] [place], [place], [address] Phone: [phone]   Fax:  [phone]    Name: Kimbree A Garoutte MRN: [id number] Date of Birth: 10/29/1950   

## 2019-12-22 NOTE — Patient Instructions (Signed)
Access Code: [email or web address] URL: [email or web address] Date: 12/22/2019 Prepared by: Venetia Night Adiba Fargnoli  Exercises Seated Heel Toe Raises - 1 x daily - 7 x weekly - 2 sets - 10 reps Seated Gluteal Sets - 1 x daily - 7 x weekly - 1 sets - 10 reps - 3 hold Sit to Stand with Counter Support - 1 x daily - 7 x weekly - 2 sets - 5 reps Standing March with Counter Support - 1 x daily - 7 x weekly - 1 sets - 20 reps Standing Single Leg Stance with Counter Support - 1 x daily - 7 x weekly - 1 sets - 2 reps - 5 hold Seated Shoulder Extension and Scapular Retraction with Resistance - 1 x daily - 7 x weekly - 2 sets - 10 reps Seated Shoulder Row with Anchored Resistance - 1 x daily - 7 x weekly - 2 sets - 10 reps Seated Hamstring Curls with Resistance - 1 x daily - 7 x weekly - 3 sets - 10 reps Sitting Knee Extension with Resistance - 1 x daily - 7 x weekly - 3 sets - 10 reps Seated Hip Abduction with Resistance - 1 x daily - 7 x weekly - 3 sets - 10 reps Seated Hip Adduction Isometrics with Ball - 1 x daily - 7 x weekly - 2 sets - 10 reps - 5 hold

## 2019-12-25 ENCOUNTER — Telehealth: Payer: Self-pay | Admitting: Physical Therapy

## 2019-12-25 ENCOUNTER — Ambulatory Visit: Payer: Medicare HMO | Admitting: Physical Therapy

## 2019-12-25 NOTE — Telephone Encounter (Signed)
Pt missed PT appointment for re-evaluation on 12/25/19.  PT called and spoke to Pt who said she tried to call but was put on hold.  She has "all the symptoms of COVID" so didn't come.  She confirmed she is talking to her doctors and supposed to get a COVID test.  PT asked Pt to call us next week before next scheduled appointment on 10/13 to give [place] an update on her status.    Mylan Schwarz, PT 12/25/19 12:00 PM

## 2019-12-30 ENCOUNTER — Ambulatory Visit: Payer: Medicare HMO | Admitting: Physical Therapy

## 2019-12-30 ENCOUNTER — Telehealth: Payer: Self-pay | Admitting: Physical Therapy

## 2019-12-30 NOTE — Telephone Encounter (Signed)
Pt missed 2nd consecutive PT appointment.  PT was unable to reach her today and left a message for her to call our clinic back.  Her certification period has expired and she was scheduled for a re-evaluation for both of last two missed appointments.  Due to this Pt will be d/c'd for now and will need a new PT referral to re-instate PT.

## 2019-12-31 ENCOUNTER — Other Ambulatory Visit: Payer: Self-pay

## 2019-12-31 ENCOUNTER — Encounter (HOSPITAL_COMMUNITY): Payer: Self-pay

## 2019-12-31 ENCOUNTER — Emergency Department (HOSPITAL_COMMUNITY)
Admission: EM | Admit: 2019-12-31 | Discharge: 2019-12-31 | Disposition: A | Discharge status: Home / Self Care | Payer: Medicare HMO | Attending: Emergency Medicine | Admitting: Emergency Medicine

## 2019-12-31 DIAGNOSIS — Z79899 Other long term (current) drug therapy: Secondary | ICD-10-CM | POA: Insufficient documentation

## 2019-12-31 DIAGNOSIS — Z7951 Long term (current) use of inhaled steroids: Secondary | ICD-10-CM | POA: Diagnosis not present

## 2019-12-31 DIAGNOSIS — E039 Hypothyroidism, unspecified: Secondary | ICD-10-CM | POA: Insufficient documentation

## 2019-12-31 DIAGNOSIS — J45909 Unspecified asthma, uncomplicated: Secondary | ICD-10-CM | POA: Insufficient documentation

## 2019-12-31 DIAGNOSIS — R112 Nausea with vomiting, unspecified: Secondary | ICD-10-CM | POA: Diagnosis not present

## 2019-12-31 DIAGNOSIS — R109 Unspecified abdominal pain: Secondary | ICD-10-CM | POA: Insufficient documentation

## 2019-12-31 LAB — CBC WITH DIFFERENTIAL/PLATELET
Abs Immature Granulocytes: 0.03 10*3/uL (ref 0.00–0.07)
Basophils Absolute: 0 10*3/uL (ref 0.0–0.1)
Basophils Relative: 1 %
Eosinophils Absolute: 0.3 10*3/uL (ref 0.0–0.5)
Eosinophils Relative: 4 %
HCT: 34.4 % — ABNORMAL LOW (ref 36.0–46.0)
Hemoglobin: 10.7 g/dL — ABNORMAL LOW (ref 12.0–15.0)
Immature Granulocytes: 0 %
Lymphocytes Relative: 25 %
Lymphs Abs: 1.7 10*3/uL (ref 0.7–4.0)
MCH: 27 pg (ref 26.0–34.0)
MCHC: 31.1 g/dL (ref 30.0–36.0)
MCV: 86.9 fL (ref 80.0–100.0)
Monocytes Absolute: 0.5 10*3/uL (ref 0.1–1.0)
Monocytes Relative: 8 %
Neutro Abs: 4.2 10*3/uL (ref 1.7–7.7)
Neutrophils Relative %: 62 %
Platelets: 177 10*3/uL (ref 150–400)
RBC: 3.96 MIL/uL (ref 3.87–5.11)
RDW: 14.1 % (ref 11.5–15.5)
WBC: 6.8 10*3/uL (ref 4.0–10.5)
nRBC: 0 % (ref 0.0–0.2)

## 2019-12-31 LAB — URINALYSIS, ROUTINE W REFLEX MICROSCOPIC
Bilirubin Urine: NEGATIVE
Glucose, UA: NEGATIVE mg/dL
Hgb urine dipstick: NEGATIVE
Ketones, ur: NEGATIVE mg/dL
Leukocytes,Ua: NEGATIVE
Nitrite: NEGATIVE
Protein, ur: NEGATIVE mg/dL
Specific Gravity, Urine: 1.011 (ref 1.005–1.030)
pH: 8 (ref 5.0–8.0)

## 2019-12-31 LAB — COMPREHENSIVE METABOLIC PANEL
ALT: 11 U/L (ref 0–44)
AST: 19 U/L (ref 15–41)
Albumin: 3.5 g/dL (ref 3.5–5.0)
Alkaline Phosphatase: 68 U/L (ref 38–126)
Anion gap: 15 (ref 5–15)
BUN: 11 mg/dL (ref 8–23)
CO2: 24 mmol/L (ref 22–32)
Calcium: 9.5 mg/dL (ref 8.9–10.3)
Chloride: 100 mmol/L (ref 98–111)
Creatinine, Ser: 0.71 mg/dL (ref 0.44–1.00)
GFR, Estimated: >60 mL/min (ref >60)
Glucose, Bld: 105 mg/dL — ABNORMAL HIGH (ref 70–99)
Potassium: 4.2 mmol/L (ref 3.5–5.1)
Sodium: 139 mmol/L (ref 135–145)
Total Bilirubin: 0.4 mg/dL (ref 0.3–1.2)
Total Protein: 6.6 g/dL (ref 6.5–8.1)

## 2019-12-31 LAB — LIPASE, BLOOD: Lipase: 38 U/L (ref 11–51)

## 2019-12-31 MED ORDER — PROMETHAZINE HCL 25 MG/ML IJ SOLN
6.2500 mg | Freq: Once | INTRAMUSCULAR | Status: AC
  Administered 2019-12-31: 6.25 mg via INTRAVENOUS
  Filled 2019-12-31: qty 1

## 2019-12-31 MED ORDER — PANTOPRAZOLE SODIUM 40 MG IV SOLR
40.0000 mg | Freq: Once | INTRAVENOUS | Status: AC
  Administered 2019-12-31: 40 mg via INTRAVENOUS
  Filled 2019-12-31: qty 40

## 2019-12-31 MED ORDER — DICYCLOMINE HCL 10 MG PO CAPS
20.0000 mg | ORAL_CAPSULE | Freq: Once | ORAL | Status: AC
  Administered 2019-12-31: 20 mg via ORAL
  Filled 2019-12-31: qty 2

## 2019-12-31 MED ORDER — PROMETHAZINE HCL 12.5 MG PO TABS
12.5000 mg | ORAL_TABLET | Freq: Four times a day (QID) | ORAL | 0 refills | Status: DC | PRN
Start: 2019-12-31 — End: 2020-03-03

## 2019-12-31 MED ORDER — SODIUM CHLORIDE 0.9 % IV BOLUS
500.0000 mL | Freq: Once | INTRAVENOUS | Status: AC
  Administered 2019-12-31: 500 mL via INTRAVENOUS

## 2019-12-31 MED ORDER — DICYCLOMINE HCL 20 MG PO TABS
20.0000 mg | ORAL_TABLET | Freq: Three times a day (TID) | ORAL | 0 refills | Status: DC | PRN
Start: 2019-12-31 — End: 2021-01-15

## 2019-12-31 NOTE — ED Triage Notes (Signed)
Patient here via EMS from home with cc of abdominal cramping, n/v.  Vomited x4, takes a friends xanax.

## 2019-12-31 NOTE — Discharge Instructions (Addendum)
You were seen in the emergency department this evening for nausea, vomiting, and abdominal pain.  Your labs are overall reassuring and similar to prior.  Your hemoglobin and hematocrit were indicative of anemia, please have these rechecked by your primary care provider.  We are sending you home with Phenergan to take every 6 hours as needed for nausea and vomiting and Bentyl to take every 8 hours as needed for abdominal cramping.  We have prescribed you new medication(s) today. Discuss the medications prescribed today with your pharmacist as they can have adverse effects and interactions with your other medicines including over the counter and prescribed medications. Seek medical evaluation if you start to experience new or abnormal symptoms after taking one of these medicines, seek care immediately if you start to experience difficulty breathing, feeling of your throat closing, facial swelling, or rash as these could be indications of a more serious allergic reaction  As discussed it is dangerous to take medications that are not prescribed to you.  Please follow with your primary care provider and/or your GI doctor within 3 days for reevaluation.  Return to the ER for new or worsening symptoms including but not limited to inability to keep fluids down, fever, worsening pain, new pain, different pain, blood in your stool, blood in your vomit, or any other concerns.

## 2019-12-31 NOTE — ED Provider Notes (Signed)
[facility] DEPT Provider Note   CSN: [id number] Arrival date & time: 12/31/19  0020     History Chief Complaint  Patient presents with  . Abdominal Pain    Patient from home c/o n/v/ and abdominal cramping.    Victoria Levine is a 69 y.o. female with a history of bipolar disorder, anxiety, depression, asthma, GERD, alcohol abuse, hypothyroidism, migraines, and hypercholesterolemia who presents to the emergency department with complaints of nausea and vomiting that began shortly prior to arrival this evening.  Patient states that she took a friend's Xanax tablet and shortly after began to have episodes of emesis.  States she vomited 3 times, nonbloody emesis, and has persistent nausea as well as associated abdominal cramping.  Given Zofran in route by EMS with some mild improvement of her symptoms.  She states this feels very similar to her prior ER visits, this is a chronic problem.  She states that she takes a friend's Xanax frequently and also drinks alcohol frequently, most recent alcohol intake was around 3 PM today.  She denies fever, chills, hematemesis, melena, hematochezia, diarrhea, constipation, dysuria, chest pain, dyspnea, or syncope.  HPI     Past Medical History:  Diagnosis Date  . Anemia    low iron  . Anxiety   . Asthma   . Bipolar disorder ([place])   . Chronic back pain   . Compression fracture of L2 lumbar vertebra (HCC)   . DDD (degenerative disc disease)   . Depression   . Dyspnea   . GERD (gastroesophageal reflux disease)   . H/O alcohol abuse    28 years sober  . H/O suicide attempt   . Herpes   . History of hiatal hernia   . Hypothyroidism   . Migraines   . Osteoporosis 11/2018   T score of -2.6  . Pneumonia   . S/P adenoidectomy   . S/P tonsillectomy     Patient Active Problem List   Diagnosis Date Noted  . Intractable nausea and vomiting 08/31/2019  . Epigastric pain 08/31/2019  . Alcohol abuse 08/31/2019  .  Microcytic anemia 08/31/2019  . Closed compression fracture of L2 vertebra ([place]) 12/11/2017  . Pain 07/26/2015  . Anxiety state 06/28/2015  . Benzodiazepine dependence ([place]) 10/13/2014  . Opioid abuse with opioid-induced mood disorder ([place]) 10/13/2014  . Bipolar 1 disorder, depressed ([place]) 10/12/2014  . Spondylolisthesis of lumbar region 07/28/2014  . Osteoporosis, unspecified 11/17/2013  . Migraines   . Herpes   . Hypothyroidism 08/13/2008  . HYPERCHOLESTEROLEMIA 08/13/2008  . Bipolar disorder ([place]) 08/13/2008  . CHRONIC OBSTRUCTIVE ASTHMA UNSPECIFIED 08/13/2008  . GERD (gastroesophageal reflux disease) 08/13/2008  . HIATAL HERNIA 08/13/2008  . ALLERGY 08/13/2008    Past Surgical History:  Procedure Laterality Date  . CESAREAN SECTION    . COLONOSCOPY     as a teenager  . Compression fracture spine    . DILATION AND CURETTAGE OF UTERUS    . EYE SURGERY Bilateral    CATARACTS REMOVED, NO LENS PLACED  . HYSTEROSCOPY    . KYPHOPLASTY N/A 12/19/2016   Procedure: THORACIC 12 KYPHOPLASTY; REQUEST 60 MINS AND FLIP ROOM;  Surgeon: Phylliss Bob, MD;  Location: [facility];  Service: Orthopedics;  Laterality: N/A;  THORACIC 12 KYPHOPLASTY; REQUEST 60 MINS AND FLIP ROOM  . KYPHOPLASTY N/A 12/11/2017   Procedure: LUMBAR 2 KYPHOPLASTY;  Surgeon: Phylliss Bob, MD;  Location: [facility];  Service: Orthopedics;  Laterality: N/A;  . LUMBAR FUSION    .  TONSILLECTOMY       OB History    Gravida  3   Para  1   Term  1   Preterm      AB  2   Living  1     SAB      TAB      Ectopic      Multiple      Live Births              Family History  Adopted: Yes    Social History   Tobacco Use  . Smoking status: Never Smoker  . Smokeless tobacco: Never Used  Vaping Use  . Vaping Use: Never used  Substance Use Topics  . Alcohol use: Yes    Alcohol/week: 0.0 standard drinks  . Drug use: Not Currently    Types: Benzodiazepines, Cocaine    Home Medications Prior to  Admission medications   Medication Sig Start Date End Date Taking? Authorizing Provider  albuterol (PROVENTIL HFA;VENTOLIN HFA) 108 (90 Base) MCG/ACT inhaler Inhale 2 puffs into the lungs every 6 (six) hours as needed for wheezing or shortness of breath.    [provider]  buPROPion (WELLBUTRIN XL) 150 MG 24 hr tablet Take 300 mg by mouth daily.  10/01/16   [provider]  calcium citrate-vitamin D (CALCIUM + D) 315-200 MG-UNIT per tablet Take 1 tablet by mouth daily. For bone health 10/18/14   Lindell Spar I, NP  chlordiazePOXIDE (LIBRIUM) 25 MG capsule 50mg  PO TID x 1D, then 25-50mg  PO BID X 1D, then 25-50mg  PO QD X 1D 05/08/19   Keerthi Hazell R, PA-C  cyclobenzaprine (FLEXERIL) 10 MG tablet Take 10 mg by mouth 3 (three) times daily as needed for muscle spasms.     [provider]  denosumab (PROLIA) 60 MG/ML SOLN injection Inject 60 mg into the skin every 6 (six) months. Administer in upper arm, thigh, or abdomen  NEXT IS DUE 03-2017 Patient not taking: Reported on 11/19/2018 03/05/17   [name], NP  dicyclomine (BENTYL) 20 MG tablet Take 1 tablet (20 mg total) by mouth 2 (two) times daily. 12/18/19   Joy, Shawn C, PA-C  EPINEPHrine 0.3 mg/0.3 mL IJ SOAJ injection Inject 0.3 mg into the skin as needed for anaphylaxis. 04/10/19   [provider]  famotidine (PEPCID) 20 MG tablet Take 1 tablet (20 mg total) by mouth at bedtime. 09/03/19 10/03/19  Damita Lack, MD  ferrous sulfate 325 (65 FE) MG tablet Take 1 tablet (325 mg total) by mouth 2 (two) times daily with a meal. 09/03/19 10/03/19  Damita Lack, MD  FLOVENT [facility] 110 MCG/ACT inhaler  07/15/19   [provider]  FLUoxetine (PROZAC) 20 MG capsule Take 20 mg by mouth 2 (two) times daily.     [provider]  gabapentin (NEURONTIN) 600 MG tablet Take 600 mg by mouth 3 (three) times daily.  11/05/16   [provider]  levothyroxine (SYNTHROID, LEVOTHROID) 112 MCG tablet  Take 112 mcg by mouth daily before breakfast.  09/04/17   [provider]  methocarbamol (ROBAXIN) 500 MG tablet Take 500 mg by mouth 2 (two) times daily. 03/11/19   [provider]  Multiple Vitamin (MULTIVITAMIN) tablet Take 1 tablet by mouth daily. For low vitamin 10/18/14   Lindell Spar I, NP  naproxen sodium (ALEVE) 220 MG tablet Take 220-440 mg by mouth 2 (two) times daily as needed (for pain).    [provider]  ondansetron (ZOFRAN ODT) 4 MG disintegrating tablet Take 1 tablet (4 mg total) by mouth every 8 (eight) hours as needed for nausea or vomiting. 12/18/19   Joy, Shawn C, PA-C  ondansetron (ZOFRAN) 8 MG tablet Take 8 mg by mouth every 8 (eight) hours as needed for nausea or vomiting (Migraine Head Ache symptoms).    [provider]  oxcarbazepine (TRILEPTAL) 600 MG tablet Take 600 mg by mouth 2 (two) times daily.     [provider]  OxyCODONE ER (XTAMPZA ER) 13.5 MG C12A Take 13.5 mg by mouth 2 (two) times daily. 11/22/16   [provider]  Oxycodone HCl 10 MG TABS Take 10 mg by mouth daily as needed (for breakthrough pain).  09/16/17   [provider]  pantoprazole (PROTONIX) 40 MG tablet Take 1 tablet (40 mg total) by mouth 2 (two) times daily before a meal. 09/03/19   Amin, Jeanella Flattery, MD  polyethylene glycol (MIRALAX / GLYCOLAX) 17 g packet Take 17 g by mouth daily as needed for moderate constipation or severe constipation. 09/03/19   Amin, Jeanella Flattery, MD  QUEtiapine (SEROQUEL) 100 MG tablet Take 100-200 mg by mouth at bedtime.  10/12/16   [provider]  rizatriptan (MAXALT-MLT) 10 MG disintegrating tablet Take 10 mg by mouth as needed for migraine.  11/08/14   [provider]  sucralfate (CARAFATE) 1 g tablet Take 1 tablet (1 g total) by mouth 4 (four) times daily -  with meals and at bedtime. 12/18/19 01/17/20  Joy, Shawn C, PA-C  valACYclovir (VALTREX) 500 MG tablet Take one tablet twice daily 3-5 days then  daily as needed. 05/01/17   [name], NP    Allergies    Prednisone, Lithium carbonate, and Sulfa antibiotics  Review of Systems   Review of Systems  Constitutional: Negative for chills and fever.  Respiratory: Negative for shortness of breath.   Cardiovascular: Negative for chest pain.  Gastrointestinal: Positive for abdominal pain, nausea and vomiting. Negative for anal bleeding, blood in stool, constipation and diarrhea.  Genitourinary: Negative for dysuria, vaginal bleeding and vaginal discharge.  Neurological: Negative for syncope.  All other systems reviewed and are negative.   Physical Exam Updated Vital Signs BP (!) 156/91 (BP Location: Left Arm)   Pulse 81   Temp 99.1 F (37.3 C) (Oral)   Resp 20   Ht 5\' 5"  (1.651 m)   Wt 68 kg   SpO2 95% Comment: Simultaneous filing. User may not have seen previous data.  BMI 24.96 kg/m   Physical Exam Vitals and nursing note reviewed.  Constitutional:      General: She is not in acute distress.    Appearance: She is well-developed. She is not toxic-appearing.  HENT:     Head: Normocephalic and atraumatic.  Eyes:     General:        Right eye: No discharge.        Left eye: No discharge.     Conjunctiva/sclera: Conjunctivae normal.  Cardiovascular:     Rate and Rhythm: Normal rate and regular rhythm.  Pulmonary:     Effort: Pulmonary effort is normal. No respiratory distress.     Breath sounds: Normal breath sounds. No wheezing, rhonchi or rales.  Abdominal:     General: There is no distension.     Palpations: Abdomen is soft.     Tenderness: There is no abdominal tenderness. There is no guarding or rebound.  Musculoskeletal:     Cervical back:  Neck supple.  Skin:    General: Skin is warm and dry.     Findings: No rash.  Neurological:     Mental Status: She is alert.     Comments: Clear speech.   Psychiatric:        Behavior: Behavior normal.    ED Results / Procedures / Treatments   Labs (all labs  ordered are listed, but only abnormal results are displayed) Labs Reviewed  COMPREHENSIVE METABOLIC PANEL - Abnormal; Notable for the following components:      Result Value   Glucose, Bld 105 (*)    All other components within normal limits  CBC WITH DIFFERENTIAL/PLATELET - Abnormal; Notable for the following components:   Hemoglobin 10.7 (*)    HCT 34.4 (*)    All other components within normal limits  URINALYSIS, ROUTINE W REFLEX MICROSCOPIC - Abnormal; Notable for the following components:   Color, Urine STRAW (*)    All other components within normal limits  LIPASE, BLOOD    EKG None  Radiology No results found.  Procedures Procedures (including critical care time)  Medications Ordered in ED Medications  sodium chloride 0.9 % bolus 500 mL (0 mLs Intravenous Stopped 12/31/19 0141)  pantoprazole (PROTONIX) injection 40 mg (40 mg Intravenous Given 12/31/19 0104)  promethazine (PHENERGAN) injection 6.25 mg (6.25 mg Intravenous Given 12/31/19 0105)  dicyclomine (BENTYL) capsule 20 mg (20 mg Oral Given 12/31/19 0105)    ED Course  I have reviewed the triage vital signs and the nursing notes.  Pertinent labs & imaging results that were available during my care of the patient were reviewed by me and considered in my medical decision making (see chart for details).    MDM Rules/Calculators/A&P                         Patient presents to the ED with complaints of abdominal pain w/ N/V this evening after taking a 1/2 tablet of xanax.  On arrival patient is nontoxic, vitals without significant abnormality, BP somewhat elevated, doubt hypertensive emergency.  Abdomen is nontender without peritoneal signs.  Patient is still feeling nauseated status post Zofran with EMS therefore will give Phenergan, fluids, and Bentyl.  Will obtain labs.  We discussed option of CT imaging, patient states her symptoms feel exactly like prior symptoms she has had with reassuring imaging, she does not  feel this is necessary, with her reassuring exam at this time feel this is reasonable with labs and evaluations pending.  She does not appear to be in acute significant withdrawal.   Additional history obtained:  Additional history obtained from chart review and nursing note review, recent ED visit 12/18/2019 for somewhat similar..  Lab Tests:  I Ordered, reviewed, and interpreted labs, which included:  Anemia similar to prior ranges.  No leukocytosis. CMP: Unremarkable Lipase: Within normal limits Urinalysis: No UTI.  02:20: RE-EVAL: Patient is feeling much better, she feels back to baseline, she is tolerating p.o. without difficulty and would like to be discharged home.  Repeat abdominal exam remains without tenderness or peritoneal signs low suspicion for acute surgical process.  Will discharge home at this time.  We discussed danger of taking other dividual's medications that are not prescribed to her specifically.  I discussed results, treatment plan, need for follow-up, and return precautions with the patient. Provided opportunity for questions, patient confirmed understanding and is in agreement with plan.   Findings and plan of care discussed with supervising  physician Dr. Betsey Holiday who is in agreement.   Portions of this note were generated with [employer]. Dictation errors may occur despite best attempts at proofreading.  Final Clinical Impression(s) / ED Diagnoses Final diagnoses:  Non-intractable vomiting with nausea, unspecified vomiting type    Rx / DC Orders ED Discharge Orders         Ordered    promethazine (PHENERGAN) 12.5 MG tablet  Every 6 hours PRN        12/31/19 0225    dicyclomine (BENTYL) 20 MG tablet  Every 8 hours PRN        12/31/19 0225           Chrystian Cupples, [place] R, PA-C 12/31/19 0226    Orpah Greek, MD 12/31/19 0230

## 2020-01-01 ENCOUNTER — Ambulatory Visit: Payer: Medicare HMO | Admitting: Physical Therapy

## 2020-01-04 NOTE — Telephone Encounter (Signed)
Pt states she will call me back about Prolia and annual visit. Pt has a hernia she is dealing with and will call me back when that is taken care of.

## 2020-01-06 ENCOUNTER — Encounter: Payer: Medicare HMO | Admitting: Physical Therapy

## 2020-01-08 ENCOUNTER — Encounter: Payer: Medicare HMO | Admitting: Physical Therapy

## 2020-02-04 ENCOUNTER — Emergency Department (HOSPITAL_COMMUNITY)
Admission: EM | Admit: 2020-02-04 | Discharge: 2020-02-04 | Disposition: A | Discharge status: Home / Self Care | Payer: Medicare HMO | Attending: Emergency Medicine | Admitting: Emergency Medicine

## 2020-02-04 ENCOUNTER — Other Ambulatory Visit: Payer: Self-pay

## 2020-02-04 ENCOUNTER — Encounter (HOSPITAL_COMMUNITY): Payer: Self-pay | Admitting: Emergency Medicine

## 2020-02-04 DIAGNOSIS — J45909 Unspecified asthma, uncomplicated: Secondary | ICD-10-CM | POA: Diagnosis not present

## 2020-02-04 DIAGNOSIS — E039 Hypothyroidism, unspecified: Secondary | ICD-10-CM | POA: Diagnosis not present

## 2020-02-04 DIAGNOSIS — Z7952 Long term (current) use of systemic steroids: Secondary | ICD-10-CM | POA: Insufficient documentation

## 2020-02-04 DIAGNOSIS — R252 Cramp and spasm: Secondary | ICD-10-CM | POA: Insufficient documentation

## 2020-02-04 DIAGNOSIS — R109 Unspecified abdominal pain: Secondary | ICD-10-CM | POA: Diagnosis not present

## 2020-02-04 DIAGNOSIS — Z760 Encounter for issue of repeat prescription: Secondary | ICD-10-CM | POA: Insufficient documentation

## 2020-02-04 DIAGNOSIS — Z79899 Other long term (current) drug therapy: Secondary | ICD-10-CM | POA: Diagnosis not present

## 2020-02-04 LAB — COMPREHENSIVE METABOLIC PANEL
ALT: 11 U/L (ref 0–44)
AST: 17 U/L (ref 15–41)
Albumin: 4.7 g/dL (ref 3.5–5.0)
Alkaline Phosphatase: 77 U/L (ref 38–126)
Anion gap: 12 (ref 5–15)
BUN: 10 mg/dL (ref 8–23)
CO2: 24 mmol/L (ref 22–32)
Calcium: 9.7 mg/dL (ref 8.9–10.3)
Chloride: 99 mmol/L (ref 98–111)
Creatinine, Ser: 0.55 mg/dL (ref 0.44–1.00)
GFR, Estimated: >60 mL/min (ref >60)
Glucose, Bld: 123 mg/dL — ABNORMAL HIGH (ref 70–99)
Potassium: 3.6 mmol/L (ref 3.5–5.1)
Sodium: 135 mmol/L (ref 135–145)
Total Bilirubin: 0.4 mg/dL (ref 0.3–1.2)
Total Protein: 7.8 g/dL (ref 6.5–8.1)

## 2020-02-04 LAB — CBC
HCT: 38.7 % (ref 36.0–46.0)
Hemoglobin: 12 g/dL (ref 12.0–15.0)
MCH: 25.9 pg — ABNORMAL LOW (ref 26.0–34.0)
MCHC: 31 g/dL (ref 30.0–36.0)
MCV: 83.6 fL (ref 80.0–100.0)
Platelets: 406 10*3/uL — ABNORMAL HIGH (ref 150–400)
RBC: 4.63 MIL/uL (ref 3.87–5.11)
RDW: 14.4 % (ref 11.5–15.5)
WBC: 9 10*3/uL (ref 4.0–10.5)
nRBC: 0 % (ref 0.0–0.2)

## 2020-02-04 LAB — LIPASE, BLOOD: Lipase: 27 U/L (ref 11–51)

## 2020-02-04 NOTE — ED Provider Notes (Signed)
[facility] DEPT Provider Note   CSN: [id number] Arrival date & time: 02/04/20  1516     History Chief Complaint  Patient presents with  . Medication Refill  . Abdominal Pain    Victoria Levine is a 69 y.o. female.  HPI She presents for evaluation of leg cramps which occurred earlier today and it resolved spontaneously.  She did not take her narcotic pain medicine since yesterday because she misplaced it.  She thinks it might be under the front seat of her car.  She came here by EMS for evaluation.  She has chronic pain and takes a morphine equivalent of 60 mg a day.  She uses both short and long acting oxycodone.  She denies shortness of breath, cough, weakness or dizziness.  There are no other known modifying factors.    Past Medical History:  Diagnosis Date  . Anemia    low iron  . Anxiety   . Asthma   . Bipolar disorder ([place])   . Chronic back pain   . Compression fracture of L2 lumbar vertebra (HCC)   . DDD (degenerative disc disease)   . Depression   . Dyspnea   . GERD (gastroesophageal reflux disease)   . H/O alcohol abuse    28 years sober  . H/O suicide attempt   . Herpes   . History of hiatal hernia   . Hypothyroidism   . Migraines   . Osteoporosis 11/2018   T score of -2.6  . Pneumonia   . S/P adenoidectomy   . S/P tonsillectomy     Patient Active Problem List   Diagnosis Date Noted  . Intractable nausea and vomiting 08/31/2019  . Epigastric pain 08/31/2019  . Alcohol abuse 08/31/2019  . Microcytic anemia 08/31/2019  . Closed compression fracture of L2 vertebra ([place]) 12/11/2017  . Pain 07/26/2015  . Anxiety state 06/28/2015  . Benzodiazepine dependence ([place]) 10/13/2014  . Opioid abuse with opioid-induced mood disorder ([place]) 10/13/2014  . Bipolar 1 disorder, depressed ([place]) 10/12/2014  . Spondylolisthesis of lumbar region 07/28/2014  . Osteoporosis, unspecified 11/17/2013  . Migraines   . Herpes   .  Hypothyroidism 08/13/2008  . HYPERCHOLESTEROLEMIA 08/13/2008  . Bipolar disorder ([place]) 08/13/2008  . CHRONIC OBSTRUCTIVE ASTHMA UNSPECIFIED 08/13/2008  . GERD (gastroesophageal reflux disease) 08/13/2008  . HIATAL HERNIA 08/13/2008  . ALLERGY 08/13/2008    Past Surgical History:  Procedure Laterality Date  . CESAREAN SECTION    . COLONOSCOPY     as a teenager  . Compression fracture spine    . DILATION AND CURETTAGE OF UTERUS    . EYE SURGERY Bilateral    CATARACTS REMOVED, NO LENS PLACED  . HYSTEROSCOPY    . KYPHOPLASTY N/A 12/19/2016   Procedure: THORACIC 12 KYPHOPLASTY; REQUEST 60 MINS AND FLIP ROOM;  Surgeon: Phylliss Bob, MD;  Location: [facility];  Service: Orthopedics;  Laterality: N/A;  THORACIC 12 KYPHOPLASTY; REQUEST 60 MINS AND FLIP ROOM  . KYPHOPLASTY N/A 12/11/2017   Procedure: LUMBAR 2 KYPHOPLASTY;  Surgeon: Phylliss Bob, MD;  Location: [facility];  Service: Orthopedics;  Laterality: N/A;  . LUMBAR FUSION    . TONSILLECTOMY       OB History    Gravida  3   Para  1   Term  1   Preterm      AB  2   Living  1     SAB      TAB  Ectopic      Multiple      Live Births              Family History  Adopted: Yes    Social History   Tobacco Use  . Smoking status: Never Smoker  . Smokeless tobacco: Never Used  Vaping Use  . Vaping Use: Never used  Substance Use Topics  . Alcohol use: Yes    Alcohol/week: 0.0 standard drinks  . Drug use: Not Currently    Types: Benzodiazepines, Cocaine    Home Medications Prior to Admission medications   Medication Sig Start Date End Date Taking? Authorizing Provider  albuterol (PROVENTIL HFA;VENTOLIN HFA) 108 (90 Base) MCG/ACT inhaler Inhale 2 puffs into the lungs every 6 (six) hours as needed for wheezing or shortness of breath.    [provider]  buPROPion (WELLBUTRIN XL) 150 MG 24 hr tablet Take 300 mg by mouth daily.  10/01/16   [provider]  calcium citrate-vitamin D (CALCIUM + D)  315-200 MG-UNIT per tablet Take 1 tablet by mouth daily. For bone health 10/18/14   Lindell Spar I, NP  chlordiazePOXIDE (LIBRIUM) 25 MG capsule 50mg  PO TID x 1D, then 25-50mg  PO BID X 1D, then 25-50mg  PO QD X 1D 05/08/19   Petrucelli, Samantha R, PA-C  cyclobenzaprine (FLEXERIL) 10 MG tablet Take 10 mg by mouth 3 (three) times daily as needed for muscle spasms.     [provider]  denosumab (PROLIA) 60 MG/ML SOLN injection Inject 60 mg into the skin every 6 (six) months. Administer in upper arm, thigh, or abdomen  NEXT IS DUE 03-2017 Patient not taking: Reported on 11/19/2018 03/05/17   [name], NP  dicyclomine (BENTYL) 20 MG tablet Take 1 tablet (20 mg total) by mouth every 8 (eight) hours as needed for spasms. 12/31/19   Petrucelli, Samantha R, PA-C  EPINEPHrine 0.3 mg/0.3 mL IJ SOAJ injection Inject 0.3 mg into the skin as needed for anaphylaxis. 04/10/19   [provider]  famotidine (PEPCID) 20 MG tablet Take 1 tablet (20 mg total) by mouth at bedtime. 09/03/19 10/03/19  Damita Lack, MD  ferrous sulfate 325 (65 FE) MG tablet Take 1 tablet (325 mg total) by mouth 2 (two) times daily with a meal. 09/03/19 10/03/19  Damita Lack, MD  FLOVENT [facility] 110 MCG/ACT inhaler  07/15/19   [provider]  FLUoxetine (PROZAC) 20 MG capsule Take 20 mg by mouth 2 (two) times daily.     [provider]  gabapentin (NEURONTIN) 600 MG tablet Take 600 mg by mouth 3 (three) times daily.  11/05/16   [provider]  levothyroxine (SYNTHROID, LEVOTHROID) 112 MCG tablet Take 112 mcg by mouth daily before breakfast.  09/04/17   [provider]  methocarbamol (ROBAXIN) 500 MG tablet Take 500 mg by mouth 2 (two) times daily. 03/11/19   [provider]  Multiple Vitamin (MULTIVITAMIN) tablet Take 1 tablet by mouth daily. For low vitamin 10/18/14   Lindell Spar I, NP  naproxen sodium (ALEVE) 220 MG tablet Take 220-440 mg by mouth 2 (two) times daily as  needed (for pain).    [provider]  ondansetron (ZOFRAN ODT) 4 MG disintegrating tablet Take 1 tablet (4 mg total) by mouth every 8 (eight) hours as needed for nausea or vomiting. 12/18/19   Joy, Shawn C, PA-C  ondansetron (ZOFRAN) 8 MG tablet Take 8 mg by mouth every 8 (eight) hours as needed for nausea or  vomiting (Migraine Head Ache symptoms).    [provider]  oxcarbazepine (TRILEPTAL) 600 MG tablet Take 600 mg by mouth 2 (two) times daily.     [provider]  OxyCODONE ER (XTAMPZA ER) 13.5 MG C12A Take 13.5 mg by mouth 2 (two) times daily. 11/22/16   [provider]  Oxycodone HCl 10 MG TABS Take 10 mg by mouth daily as needed (for breakthrough pain).  09/16/17   [provider]  pantoprazole (PROTONIX) 40 MG tablet Take 1 tablet (40 mg total) by mouth 2 (two) times daily before a meal. 09/03/19   Amin, Jeanella Flattery, MD  polyethylene glycol (MIRALAX / GLYCOLAX) 17 g packet Take 17 g by mouth daily as needed for moderate constipation or severe constipation. 09/03/19   Amin, Jeanella Flattery, MD  promethazine (PHENERGAN) 12.5 MG tablet Take 1 tablet (12.5 mg total) by mouth every 6 (six) hours as needed for nausea or vomiting. 12/31/19   Petrucelli, Samantha R, PA-C  QUEtiapine (SEROQUEL) 100 MG tablet Take 100-200 mg by mouth at bedtime.  10/12/16   [provider]  rizatriptan (MAXALT-MLT) 10 MG disintegrating tablet Take 10 mg by mouth as needed for migraine.  11/08/14   [provider]  sucralfate (CARAFATE) 1 g tablet Take 1 tablet (1 g total) by mouth 4 (four) times daily -  with meals and at bedtime. 12/18/19 01/17/20  Joy, Shawn C, PA-C  valACYclovir (VALTREX) 500 MG tablet Take one tablet twice daily 3-5 days then daily as needed. 05/01/17   [name], NP    Allergies    Prednisone, Lithium carbonate, and Sulfa antibiotics  Review of Systems   Review of Systems  All other systems reviewed and are negative.   Physical  Exam Updated Vital Signs BP (!) 175/102 (BP Location: Left Arm)   Pulse 99   Temp 97.8 F (36.6 C) (Oral)   Resp 19   SpO2 98%   Physical Exam Vitals and nursing note reviewed.  Constitutional:      General: She is not in acute distress.    Appearance: She is well-developed. She is not ill-appearing, toxic-appearing or diaphoretic.  HENT:     Head: Normocephalic and atraumatic.     Right Ear: External ear normal.     Left Ear: External ear normal.  Eyes:     Conjunctiva/sclera: Conjunctivae normal.     Pupils: Pupils are equal, round, and reactive to light.  Neck:     Trachea: Phonation normal.  Cardiovascular:     Rate and Rhythm: Normal rate.  Pulmonary:     Effort: Pulmonary effort is normal.  Abdominal:     General: There is no distension.  Musculoskeletal:        General: Normal range of motion.     Cervical back: Normal range of motion and neck supple.  Skin:    General: Skin is warm and dry.  Neurological:     Mental Status: She is alert and oriented to person, place, and time.     Cranial Nerves: No cranial nerve deficit.     Sensory: No sensory deficit.     Motor: No abnormal muscle tone.     Coordination: Coordination normal.  Psychiatric:        Mood and Affect: Mood normal.        Behavior: Behavior normal.        Thought Content: Thought content normal.        Judgment: Judgment normal.  ED Results / Procedures / Treatments   Labs (all labs ordered are listed, but only abnormal results are displayed) Labs Reviewed  LIPASE, BLOOD  COMPREHENSIVE METABOLIC PANEL  CBC  URINALYSIS, ROUTINE W REFLEX MICROSCOPIC    EKG None  Radiology No results found.  Procedures Procedures (including critical care time)  Medications Ordered in ED Medications - No data to display  ED Course  I have reviewed the triage vital signs and the nursing notes.  Pertinent labs & imaging results that were available during my care of the patient were reviewed by  me and considered in my medical decision making (see chart for details).    MDM Rules/Calculators/A&P                           Patient Vitals for the past 24 hrs:  BP Temp Temp src Pulse Resp SpO2  02/04/20 1636 (!) 175/102 -- -- 99 19 98 %  02/04/20 1530 -- -- -- -- -- 96 %  02/04/20 1521 (!) 170/65 97.8 F (36.6 C) Oral (!) 102 18 93 %    4:49 PM Reevaluation with update and discussion. After initial assessment and treatment, an updated evaluation reveals no change in clinical status, findings discussed with the patient and all questions were answered. Daleen Bo   Medical Decision Making:  This patient is presenting for evaluation of Leg cramps, which does not require a range of treatment options, and is not a complaint that involves a high risk of morbidity and mortality. The differential diagnoses include muscle strain, leg, narcotic withdrawal. I decided to review old records, and in summary elderly female with chronic pain did not take her pain medicine today has cramps in her legs which resolved spontaneously.  I did not require additional historical information from anyone.    Critical Interventions-clinical evaluation, discussion with patient  After These Interventions, the Patient was reevaluated and was found  stable for discharge  CRITICAL CARE-no Performed by: Daleen Bo  Nursing Notes Reviewed/ Care Coordinated Applicable Imaging Reviewed Interpretation of Laboratory Data incorporated into ED treatment  The patient appears reasonably screened and/or stabilized for discharge and I doubt any other medical condition or other [facility] requiring further screening, evaluation, or treatment in the ED at this time prior to discharge.  Plan: Home Medications-continue usual; Home Treatments-rest, fluids; return here if the recommended treatment, does not improve the symptoms; Recommended follow up-PCP, as needed     Final Clinical Impression(s) / ED Diagnoses Final  diagnoses:  Leg cramps    Rx / DC Orders ED Discharge Orders    None       Daleen Bo, MD 02/04/20 1651

## 2020-02-04 NOTE — ED Triage Notes (Signed)
Per pt, states she is having abdominal cramping due to possible opiate withdrawal-has been out of pain meds since yesterday although she claims to has pills stashed in glove box of car and in night stand

## 2020-02-04 NOTE — ED Triage Notes (Signed)
Per EMS- Patient reports that she ran out of pain meds and it is not due to be refilled until 02/07/20.  Patient c/o abdominal pain.

## 2020-02-04 NOTE — Discharge Instructions (Signed)
Take your medicine as directed.  Drink plenty of fluids.  Call your doctor for follow-up appointment as needed for problems.

## 2020-02-23 ENCOUNTER — Encounter (HOSPITAL_COMMUNITY): Payer: Self-pay | Admitting: Emergency Medicine

## 2020-02-23 ENCOUNTER — Emergency Department (HOSPITAL_COMMUNITY): Payer: Medicare HMO

## 2020-02-23 ENCOUNTER — Inpatient Hospital Stay (HOSPITAL_COMMUNITY)
Admission: EM | Admit: 2020-02-23 | Discharge: 2020-03-03 | DRG: 521 | Disposition: A | Discharge status: Skilled Nursing Facility | Payer: Medicare HMO | Attending: Internal Medicine | Admitting: Internal Medicine

## 2020-02-23 ENCOUNTER — Other Ambulatory Visit: Payer: Self-pay

## 2020-02-23 DIAGNOSIS — D638 Anemia in other chronic diseases classified elsewhere: Secondary | ICD-10-CM | POA: Diagnosis present

## 2020-02-23 DIAGNOSIS — S82122A Displaced fracture of lateral condyle of left tibia, initial encounter for closed fracture: Secondary | ICD-10-CM

## 2020-02-23 DIAGNOSIS — G9341 Metabolic encephalopathy: Secondary | ICD-10-CM | POA: Diagnosis present

## 2020-02-23 DIAGNOSIS — E785 Hyperlipidemia, unspecified: Secondary | ICD-10-CM | POA: Diagnosis present

## 2020-02-23 DIAGNOSIS — M25552 Pain in left hip: Secondary | ICD-10-CM

## 2020-02-23 DIAGNOSIS — M810 Age-related osteoporosis without current pathological fracture: Secondary | ICD-10-CM | POA: Diagnosis present

## 2020-02-23 DIAGNOSIS — E7800 Pure hypercholesterolemia, unspecified: Secondary | ICD-10-CM | POA: Diagnosis present

## 2020-02-23 DIAGNOSIS — Z23 Encounter for immunization: Secondary | ICD-10-CM | POA: Diagnosis not present

## 2020-02-23 DIAGNOSIS — Z20822 Contact with and (suspected) exposure to covid-19: Secondary | ICD-10-CM | POA: Diagnosis present

## 2020-02-23 DIAGNOSIS — R296 Repeated falls: Secondary | ICD-10-CM | POA: Diagnosis present

## 2020-02-23 DIAGNOSIS — Z7951 Long term (current) use of inhaled steroids: Secondary | ICD-10-CM

## 2020-02-23 DIAGNOSIS — S72142A Displaced intertrochanteric fracture of left femur, initial encounter for closed fracture: Secondary | ICD-10-CM | POA: Diagnosis present

## 2020-02-23 DIAGNOSIS — M8000XA Age-related osteoporosis with current pathological fracture, unspecified site, initial encounter for fracture: Secondary | ICD-10-CM | POA: Diagnosis not present

## 2020-02-23 DIAGNOSIS — S72002A Fracture of unspecified part of neck of left femur, initial encounter for closed fracture: Secondary | ICD-10-CM

## 2020-02-23 DIAGNOSIS — K219 Gastro-esophageal reflux disease without esophagitis: Secondary | ICD-10-CM | POA: Diagnosis present

## 2020-02-23 DIAGNOSIS — F1020 Alcohol dependence, uncomplicated: Secondary | ICD-10-CM | POA: Diagnosis present

## 2020-02-23 DIAGNOSIS — Z79899 Other long term (current) drug therapy: Secondary | ICD-10-CM

## 2020-02-23 DIAGNOSIS — J449 Chronic obstructive pulmonary disease, unspecified: Secondary | ICD-10-CM | POA: Diagnosis present

## 2020-02-23 DIAGNOSIS — F310 Bipolar disorder, current episode hypomanic: Secondary | ICD-10-CM | POA: Diagnosis not present

## 2020-02-23 DIAGNOSIS — W19XXXA Unspecified fall, initial encounter: Secondary | ICD-10-CM

## 2020-02-23 DIAGNOSIS — F319 Bipolar disorder, unspecified: Secondary | ICD-10-CM | POA: Diagnosis present

## 2020-02-23 DIAGNOSIS — E039 Hypothyroidism, unspecified: Secondary | ICD-10-CM | POA: Diagnosis present

## 2020-02-23 DIAGNOSIS — F1010 Alcohol abuse, uncomplicated: Secondary | ICD-10-CM | POA: Diagnosis present

## 2020-02-23 DIAGNOSIS — D62 Acute posthemorrhagic anemia: Secondary | ICD-10-CM | POA: Diagnosis not present

## 2020-02-23 DIAGNOSIS — Z9151 Personal history of suicidal behavior: Secondary | ICD-10-CM | POA: Diagnosis not present

## 2020-02-23 DIAGNOSIS — W1830XA Fall on same level, unspecified, initial encounter: Secondary | ICD-10-CM | POA: Diagnosis present

## 2020-02-23 DIAGNOSIS — E038 Other specified hypothyroidism: Secondary | ICD-10-CM | POA: Diagnosis not present

## 2020-02-23 DIAGNOSIS — Y92009 Unspecified place in unspecified non-institutional (private) residence as the place of occurrence of the external cause: Secondary | ICD-10-CM

## 2020-02-23 DIAGNOSIS — Z79890 Hormone replacement therapy (postmenopausal): Secondary | ICD-10-CM | POA: Diagnosis not present

## 2020-02-23 DIAGNOSIS — E876 Hypokalemia: Secondary | ICD-10-CM | POA: Diagnosis present

## 2020-02-23 DIAGNOSIS — G894 Chronic pain syndrome: Secondary | ICD-10-CM | POA: Diagnosis present

## 2020-02-23 HISTORY — DX: Other specified postprocedural states: Z98.890

## 2020-02-23 HISTORY — DX: Nausea with vomiting, unspecified: R11.2

## 2020-02-23 LAB — CBC WITH DIFFERENTIAL/PLATELET
Abs Immature Granulocytes: 0.09 10*3/uL — ABNORMAL HIGH (ref 0.00–0.07)
Basophils Absolute: 0 10*3/uL (ref 0.0–0.1)
Basophils Relative: 0 %
Eosinophils Absolute: 0 10*3/uL (ref 0.0–0.5)
Eosinophils Relative: 0 %
HCT: 41.5 % (ref 36.0–46.0)
Hemoglobin: 12.8 g/dL (ref 12.0–15.0)
Immature Granulocytes: 1 %
Lymphocytes Relative: 3 %
Lymphs Abs: 0.4 10*3/uL — ABNORMAL LOW (ref 0.7–4.0)
MCH: 25.4 pg — ABNORMAL LOW (ref 26.0–34.0)
MCHC: 30.8 g/dL (ref 30.0–36.0)
MCV: 82.3 fL (ref 80.0–100.0)
Monocytes Absolute: 0.9 10*3/uL (ref 0.1–1.0)
Monocytes Relative: 6 %
Neutro Abs: 13 10*3/uL — ABNORMAL HIGH (ref 1.7–7.7)
Neutrophils Relative %: 90 %
Platelets: 362 10*3/uL (ref 150–400)
RBC: 5.04 MIL/uL (ref 3.87–5.11)
RDW: 14.8 % (ref 11.5–15.5)
WBC: 14.4 10*3/uL — ABNORMAL HIGH (ref 4.0–10.5)
nRBC: 0 % (ref 0.0–0.2)

## 2020-02-23 LAB — BASIC METABOLIC PANEL
Anion gap: 15 (ref 5–15)
BUN: 9 mg/dL (ref 8–23)
CO2: 21 mmol/L — ABNORMAL LOW (ref 22–32)
Calcium: 9.9 mg/dL (ref 8.9–10.3)
Chloride: 97 mmol/L — ABNORMAL LOW (ref 98–111)
Creatinine, Ser: 0.62 mg/dL (ref 0.44–1.00)
GFR, Estimated: >60 mL/min (ref >60)
Glucose, Bld: 188 mg/dL — ABNORMAL HIGH (ref 70–99)
Potassium: 3.2 mmol/L — ABNORMAL LOW (ref 3.5–5.1)
Sodium: 133 mmol/L — ABNORMAL LOW (ref 135–145)

## 2020-02-23 LAB — CBG MONITORING, ED: Glucose-Capillary: 195 mg/dL — ABNORMAL HIGH (ref 70–99)

## 2020-02-23 LAB — CK: Total CK: 373 U/L — ABNORMAL HIGH (ref 38–234)

## 2020-02-23 LAB — RESP PANEL BY RT-PCR (FLU A&B, COVID) ARPGX2
Influenza A by PCR: NEGATIVE
Influenza B by PCR: NEGATIVE
SARS Coronavirus 2 by RT PCR: NEGATIVE

## 2020-02-23 LAB — TYPE AND SCREEN
ABO/RH(D): AB POS
Antibody Screen: NEGATIVE

## 2020-02-23 LAB — PROTIME-INR
INR: 1.1 (ref 0.8–1.2)
Prothrombin Time: 13.8 seconds (ref 11.4–15.2)

## 2020-02-23 MED ORDER — HYDROMORPHONE HCL 1 MG/ML IJ SOLN
0.5000 mg | INTRAMUSCULAR | Status: DC | PRN
  Administered 2020-02-23 – 2020-02-26 (×7): 1 mg via INTRAVENOUS
  Filled 2020-02-23 (×7): qty 1

## 2020-02-23 MED ORDER — MORPHINE SULFATE (PF) 4 MG/ML IV SOLN
4.0000 mg | INTRAVENOUS | Status: AC | PRN
  Administered 2020-02-23 (×2): 4 mg via INTRAVENOUS
  Filled 2020-02-23 (×2): qty 1

## 2020-02-23 MED ORDER — THIAMINE HCL 100 MG/ML IJ SOLN
100.0000 mg | Freq: Every day | INTRAMUSCULAR | Status: DC
  Filled 2020-02-23 (×2): qty 2

## 2020-02-23 MED ORDER — ADULT MULTIVITAMIN W/MINERALS CH
1.0000 | ORAL_TABLET | Freq: Every day | ORAL | Status: DC
  Administered 2020-02-24 – 2020-03-03 (×8): 1 via ORAL
  Filled 2020-02-23 (×9): qty 1

## 2020-02-23 MED ORDER — SODIUM CHLORIDE 0.9 % IV SOLN: INTRAVENOUS | Status: DC

## 2020-02-23 MED ORDER — ENOXAPARIN SODIUM 40 MG/0.4ML ~~LOC~~ SOLN
40.0000 mg | SUBCUTANEOUS | Status: DC
  Administered 2020-02-23: 40 mg via SUBCUTANEOUS
  Filled 2020-02-23: qty 0.4

## 2020-02-23 MED ORDER — ONDANSETRON HCL 4 MG/2ML IJ SOLN
4.0000 mg | Freq: Four times a day (QID) | INTRAMUSCULAR | Status: DC | PRN
  Administered 2020-02-24: 4 mg via INTRAVENOUS
  Filled 2020-02-23: qty 2

## 2020-02-23 MED ORDER — ONDANSETRON HCL 4 MG PO TABS: 4.0000 mg | ORAL_TABLET | Freq: Four times a day (QID) | ORAL | Status: DC | PRN

## 2020-02-23 MED ORDER — LORAZEPAM 2 MG/ML IJ SOLN
1.0000 mg | INTRAMUSCULAR | Status: AC | PRN
  Administered 2020-02-25: 1 mg via INTRAVENOUS
  Filled 2020-02-23: qty 1

## 2020-02-23 MED ORDER — LORAZEPAM 1 MG PO TABS
1.0000 mg | ORAL_TABLET | ORAL | Status: AC | PRN
  Administered 2020-02-25 – 2020-02-26 (×3): 1 mg via ORAL
  Filled 2020-02-23 (×3): qty 1

## 2020-02-23 MED ORDER — ONDANSETRON HCL 4 MG/2ML IJ SOLN
4.0000 mg | Freq: Once | INTRAMUSCULAR | Status: AC
  Administered 2020-02-23: 4 mg via INTRAVENOUS
  Filled 2020-02-23: qty 2

## 2020-02-23 MED ORDER — THIAMINE HCL 100 MG PO TABS
100.0000 mg | ORAL_TABLET | Freq: Every day | ORAL | Status: DC
  Administered 2020-02-24 – 2020-03-03 (×8): 100 mg via ORAL
  Filled 2020-02-23 (×8): qty 1

## 2020-02-23 MED ORDER — ACETAMINOPHEN 650 MG RE SUPP: 650.0000 mg | Freq: Four times a day (QID) | RECTAL | Status: DC | PRN

## 2020-02-23 MED ORDER — ACETAMINOPHEN 325 MG PO TABS
650.0000 mg | ORAL_TABLET | Freq: Four times a day (QID) | ORAL | Status: DC | PRN
  Administered 2020-02-28 – 2020-03-03 (×6): 650 mg via ORAL
  Filled 2020-02-23 (×6): qty 2

## 2020-02-23 MED ORDER — FOLIC ACID 1 MG PO TABS
1.0000 mg | ORAL_TABLET | Freq: Every day | ORAL | Status: DC
  Administered 2020-02-24 – 2020-03-02 (×8): 1 mg via ORAL
  Filled 2020-02-23 (×8): qty 1

## 2020-02-23 NOTE — H&P (Addendum)
History and Physical   Victoria Levine PXT:[id number] DOB: 06-28-1950 DOA: 02/23/2020  Referring MD/NP/PA: Dr. Ron Parker  PCP: [name], MD   Outpatient Specialists: [facility] for orthopedics  Patient coming from: Home  Chief Complaint: Fall with left hip pain  HPI: Victoria Levine is a 69 y.o. female with medical history significant of alcohol abuse ongoing, bipolar disorder, degenerative disc disease, GERD, asthma, anxiety disorder, hypothyroidism, osteoporosis who had a mechanical fall at home apparently.  Came to the ER complaining of left hip pain.  She fell on her left side.  The pain is 9 out of 10.  Worse on the left side.  Radiating down her leg.  Denied any other site of pain.  Denied any fever or chills denied any nausea vomiting or diarrhea.  Patient was seen and evaluated.  She appears to have left displaced intertrochanteric femur fracture.  Orthopedics consulted and patient being admitted to the hospital for possible repair..  ED Course: Temperature 98.8 blood pressure 204/117, pulse 93 respiratory rate 22 oxygen sats 91% room air.  White count 14.4 hemoglobin 12.8 platelets 362.  Sodium 133 potassium 3.2 chloride 97 CO2 21.  COVID-19 screen is negative.  X-ray of the left ankle chest x-ray knee were all negative x-ray of the left hip shows displaced femoral neck fracture.  Orthopedics consulted and patient being admitted to the hospital for further evaluation and treatment.  Review of Systems: As per HPI otherwise 10 point review of systems negative.    Past Medical History:  Diagnosis Date  . Anemia    low iron  . Anxiety   . Asthma   . Bipolar disorder ([place])   . Chronic back pain   . Compression fracture of L2 lumbar vertebra (HCC)   . DDD (degenerative disc disease)   . Depression   . Dyspnea   . GERD (gastroesophageal reflux disease)   . H/O alcohol abuse    28 years sober  . H/O suicide attempt   . Herpes   . History of hiatal hernia   . Hypothyroidism    . Migraines   . Osteoporosis 11/2018   T score of -2.6  . Pneumonia   . S/P adenoidectomy   . S/P tonsillectomy     Past Surgical History:  Procedure Laterality Date  . CESAREAN SECTION    . COLONOSCOPY     as a teenager  . Compression fracture spine    . DILATION AND CURETTAGE OF UTERUS    . EYE SURGERY Bilateral    CATARACTS REMOVED, NO LENS PLACED  . HYSTEROSCOPY    . KYPHOPLASTY N/A 12/19/2016   Procedure: THORACIC 12 KYPHOPLASTY; REQUEST 60 MINS AND FLIP ROOM;  Surgeon: Phylliss Bob, MD;  Location: [facility];  Service: Orthopedics;  Laterality: N/A;  THORACIC 12 KYPHOPLASTY; REQUEST 60 MINS AND FLIP ROOM  . KYPHOPLASTY N/A 12/11/2017   Procedure: LUMBAR 2 KYPHOPLASTY;  Surgeon: Phylliss Bob, MD;  Location: [facility];  Service: Orthopedics;  Laterality: N/A;  . LUMBAR FUSION    . TONSILLECTOMY       reports that she has never smoked. She has never used smokeless tobacco. She reports current alcohol use. She reports previous drug use. Drugs: Benzodiazepines and Cocaine.  Allergies  Allergen Reactions  . Prednisone Other (See Comments)    Cannot tolerate ORALLY, but can tolerate by shot (interacts with anxiety & bipolarity)  . Lithium Carbonate Other (See Comments)    Patient was taken off of this because of a  weight gain of 40 pounds  . Sulfa Antibiotics Other (See Comments)    From childhood; reaction not recalled    Family History  Adopted: Yes     Prior to Admission medications   Medication Sig Start Date End Date Taking? Authorizing Provider  albuterol (PROVENTIL HFA;VENTOLIN HFA) 108 (90 Base) MCG/ACT inhaler Inhale 2 puffs into the lungs every 6 (six) hours as needed for wheezing or shortness of breath.    [provider]  buPROPion (WELLBUTRIN XL) 150 MG 24 hr tablet Take 300 mg by mouth daily.  10/01/16   [provider]  calcium citrate-vitamin D (CALCIUM + D) 315-200 MG-UNIT per tablet Take 1 tablet by mouth daily. For bone health 10/18/14    Lindell Spar I, NP  chlordiazePOXIDE (LIBRIUM) 25 MG capsule 50mg  PO TID x 1D, then 25-50mg  PO BID X 1D, then 25-50mg  PO QD X 1D 05/08/19   Petrucelli, Samantha R, PA-C  cyclobenzaprine (FLEXERIL) 10 MG tablet Take 10 mg by mouth 3 (three) times daily as needed for muscle spasms.     [provider]  denosumab (PROLIA) 60 MG/ML SOLN injection Inject 60 mg into the skin every 6 (six) months. Administer in upper arm, thigh, or abdomen  NEXT IS DUE 03-2017 Patient not taking: Reported on 11/19/2018 03/05/17   [name], NP  dicyclomine (BENTYL) 20 MG tablet Take 1 tablet (20 mg total) by mouth every 8 (eight) hours as needed for spasms. 12/31/19   Petrucelli, Samantha R, PA-C  EPINEPHrine 0.3 mg/0.3 mL IJ SOAJ injection Inject 0.3 mg into the skin as needed for anaphylaxis. 04/10/19   [provider]  famotidine (PEPCID) 20 MG tablet Take 1 tablet (20 mg total) by mouth at bedtime. 09/03/19 10/03/19  Damita Lack, MD  ferrous sulfate 325 (65 FE) MG tablet Take 1 tablet (325 mg total) by mouth 2 (two) times daily with a meal. 09/03/19 10/03/19  Damita Lack, MD  FLOVENT [facility] 110 MCG/ACT inhaler  07/15/19   [provider]  FLUoxetine (PROZAC) 20 MG capsule Take 20 mg by mouth 2 (two) times daily.     [provider]  gabapentin (NEURONTIN) 600 MG tablet Take 600 mg by mouth 3 (three) times daily.  11/05/16   [provider]  levothyroxine (SYNTHROID, LEVOTHROID) 112 MCG tablet Take 112 mcg by mouth daily before breakfast.  09/04/17   [provider]  methocarbamol (ROBAXIN) 500 MG tablet Take 500 mg by mouth 2 (two) times daily. 03/11/19   [provider]  Multiple Vitamin (MULTIVITAMIN) tablet Take 1 tablet by mouth daily. For low vitamin 10/18/14   Lindell Spar I, NP  naproxen sodium (ALEVE) 220 MG tablet Take 220-440 mg by mouth 2 (two) times daily as needed (for pain).    [provider]  ondansetron (ZOFRAN ODT) 4 MG  disintegrating tablet Take 1 tablet (4 mg total) by mouth every 8 (eight) hours as needed for nausea or vomiting. 12/18/19   Joy, Shawn C, PA-C  ondansetron (ZOFRAN) 8 MG tablet Take 8 mg by mouth every 8 (eight) hours as needed for nausea or vomiting (Migraine Head Ache symptoms).    [provider]  oxcarbazepine (TRILEPTAL) 600 MG tablet Take 600 mg by mouth 2 (two) times daily.     [provider]  OxyCODONE ER (XTAMPZA ER) 13.5 MG C12A Take 13.5 mg by mouth 2 (two) times daily. 11/22/16   [provider]  Oxycodone HCl 10 MG TABS Take  10 mg by mouth daily as needed (for breakthrough pain).  09/16/17   [provider]  pantoprazole (PROTONIX) 40 MG tablet Take 1 tablet (40 mg total) by mouth 2 (two) times daily before a meal. 09/03/19   Amin, Jeanella Flattery, MD  polyethylene glycol (MIRALAX / GLYCOLAX) 17 g packet Take 17 g by mouth daily as needed for moderate constipation or severe constipation. 09/03/19   Amin, Jeanella Flattery, MD  promethazine (PHENERGAN) 12.5 MG tablet Take 1 tablet (12.5 mg total) by mouth every 6 (six) hours as needed for nausea or vomiting. 12/31/19   Petrucelli, Samantha R, PA-C  QUEtiapine (SEROQUEL) 100 MG tablet Take 100-200 mg by mouth at bedtime.  10/12/16   [provider]  rizatriptan (MAXALT-MLT) 10 MG disintegrating tablet Take 10 mg by mouth as needed for migraine.  11/08/14   [provider]  sucralfate (CARAFATE) 1 g tablet Take 1 tablet (1 g total) by mouth 4 (four) times daily -  with meals and at bedtime. 12/18/19 01/17/20  Joy, Shawn C, PA-C  valACYclovir (VALTREX) 500 MG tablet Take one tablet twice daily 3-5 days then daily as needed. 05/01/17   [name], NP    Physical Exam: Vitals:   02/23/20 1930 02/23/20 2000 02/23/20 2030 02/23/20 2031  BP: (!) 144/99 (!) 182/83 (!) 155/97 (!) 155/97  Pulse: 88 92 92 92  Resp: 14 15 16  (!) 22  Temp:      TempSrc:      SpO2: 100% 91% 93% 91%  Weight:      Height:           Constitutional: Acutely ill looking, tremulous Vitals:   02/23/20 1930 02/23/20 2000 02/23/20 2030 02/23/20 2031  BP: (!) 144/99 (!) 182/83 (!) 155/97 (!) 155/97  Pulse: 88 92 92 92  Resp: 14 15 16  (!) 22  Temp:      TempSrc:      SpO2: 100% 91% 93% 91%  Weight:      Height:       Eyes: PERRL, lids and conjunctivae normal ENMT: Mucous membranes are dry. Posterior pharynx clear of any exudate or lesions.Normal dentition.  Neck: normal, supple, no masses, no thyromegaly Respiratory: clear to auscultation bilaterally, no wheezing, no crackles. Normal respiratory effort. No accessory muscle use.  Cardiovascular: Sinus tachycardia, no murmurs / rubs / gallops. No extremity edema. 2+ pedal pulses. No carotid bruits.  Abdomen: no tenderness, no masses palpated. No hepatosplenomegaly. Bowel sounds positive.  Musculoskeletal: no clubbing / cyanosis. No joint deformity upper and lower extremities.  Left lower extremity shortening, medially rotated, ecchymosis and tenderness laterally over the hip joint. Normal muscle tone.  Skin: no rashes, lesions, ulcers.  Multiple bruises in hands body and lower extremity Neurologic: CN 2-12 grossly intact. Sensation intact, DTR normal. Strength 5/5 in all 4.  Psychiatric: Normal judgment and insight. Alert and oriented x 3. Normal mood.     Labs on Admission: I have personally reviewed following labs and imaging studies  CBC: Recent Labs  Lab 02/23/20 1702  WBC 14.4*  NEUTROABS 13.0*  HGB 12.8  HCT 41.5  MCV 82.3  PLT 676   Basic Metabolic Panel: Recent Labs  Lab 02/23/20 1702  NA 133*  K 3.2*  CL 97*  CO2 21*  GLUCOSE 188*  BUN 9  CREATININE 0.62  CALCIUM 9.9   GFR: Estimated Creatinine Clearance: 69.6 mL/min (by C-G formula based on SCr of 0.62 mg/dL). Liver Function Tests: No results for input(s):  AST, ALT, ALKPHOS, BILITOT, PROT, ALBUMIN in the last 168 hours. No results for input(s): LIPASE, AMYLASE in the last 168  hours. No results for input(s): AMMONIA in the last 168 hours. Coagulation Profile: Recent Labs  Lab 02/23/20 1702  INR 1.1   Cardiac Enzymes: Recent Labs  Lab 02/23/20 1702  CKTOTAL 373*   BNP (last 3 results) No results for input(s): PROBNP in the last 8760 hours. HbA1C: No results for input(s): HGBA1C in the last 72 hours. CBG: Recent Labs  Lab 02/23/20 1653  GLUCAP 195*   Lipid Profile: No results for input(s): CHOL, HDL, LDLCALC, TRIG, CHOLHDL, LDLDIRECT in the last 72 hours. Thyroid Function Tests: No results for input(s): TSH, T4TOTAL, FREET4, T3FREE, THYROIDAB in the last 72 hours. Anemia Panel: No results for input(s): VITAMINB12, FOLATE, FERRITIN, TIBC, IRON, RETICCTPCT in the last 72 hours. Urine analysis:    Component Value Date/Time   COLORURINE STRAW (A) 12/31/2019 0103   APPEARANCEUR CLEAR 12/31/2019 0103   LABSPEC 1.011 12/31/2019 0103   PHURINE 8.0 12/31/2019 0103   GLUCOSEU NEGATIVE 12/31/2019 0103   HGBUR NEGATIVE 12/31/2019 0103   BILIRUBINUR NEGATIVE 12/31/2019 0103   KETONESUR NEGATIVE 12/31/2019 0103   PROTEINUR NEGATIVE 12/31/2019 0103   UROBILINOGEN 0.2 11/12/2013 2233   NITRITE NEGATIVE 12/31/2019 0103   LEUKOCYTESUR NEGATIVE 12/31/2019 0103   Sepsis Labs: @LABRCNTIP (procalcitonin:4,lacticidven:4) ) Recent Results (from the past 240 hour(s))  Resp Panel by RT-PCR (Flu A&B, Covid) Nasopharyngeal Swab     Status: None   Collection Time: 02/23/20  6:48 PM   Specimen: Nasopharyngeal Swab; Nasopharyngeal(NP) swabs in vial transport medium  Result Value Ref Range Status   SARS Coronavirus 2 by RT PCR NEGATIVE NEGATIVE Final    Comment: (NOTE) SARS-CoV-2 target nucleic acids are NOT DETECTED.  The SARS-CoV-2 RNA is generally detectable in upper respiratory specimens during the acute phase of infection. The lowest concentration of SARS-CoV-2 viral copies this assay can detect is 138 copies/mL. A negative result does not preclude  SARS-Cov-2 infection and should not be used as the sole basis for treatment or other patient management decisions. A negative result may occur with  improper specimen collection/handling, submission of specimen other than nasopharyngeal swab, presence of viral mutation(s) within the areas targeted by this assay, and inadequate number of viral copies(<138 copies/mL). A negative result must be combined with clinical observations, patient history, and epidemiological information. The expected result is Negative.  Fact Sheet for Patients:  EntrepreneurPulse.com.au  Fact Sheet for Healthcare Providers:  IncredibleEmployment.be  This test is no t yet approved or cleared by the [place] FDA and  has been authorized for detection and/or diagnosis of SARS-CoV-2 by FDA under an Emergency Use Authorization (EUA). This EUA will remain  in effect (meaning this test can be used) for the duration of the COVID-19 declaration under Section 564(b)(1) of the Act, 21 U.S.C.section 360bbb-3(b)(1), unless the authorization is terminated  or revoked sooner.       Influenza A by PCR NEGATIVE NEGATIVE Final   Influenza B by PCR NEGATIVE NEGATIVE Final    Comment: (NOTE) The Xpert Xpress SARS-CoV-2/FLU/RSV plus assay is intended as an aid in the diagnosis of influenza from Nasopharyngeal swab specimens and should not be used as a sole basis for treatment. Nasal washings and aspirates are unacceptable for Xpert Xpress SARS-CoV-2/FLU/RSV testing.  Fact Sheet for Patients: EntrepreneurPulse.com.au  Fact Sheet for Healthcare Providers: IncredibleEmployment.be  This test is not yet approved or cleared by the [place] FDA and has  been authorized for detection and/or diagnosis of SARS-CoV-2 by FDA under an Emergency Use Authorization (EUA). This EUA will remain in effect (meaning this test can be used) for the duration of  the COVID-19 declaration under Section 564(b)(1) of the Act, 21 U.S.C. section 360bbb-3(b)(1), unless the authorization is terminated or revoked.  Performed at [facility], [place] [address] [place], [place] [address]      Radiological Exams on Admission: DG Chest 1 View  Result Date: 02/23/2020 CLINICAL DATA:  Pain status post fall EXAM: CHEST  1 VIEW COMPARISON:  04/25/2018 FINDINGS: The heart size is enlarged, increased in size from prior study. There is a large hiatal hernia. The right heart border is indistinct, likely secondary to herniated peritoneal fat abutting the right heart border. The trachea is nearly midline. There is probable atelectasis at the lung bases bilaterally. There is no acute osseous abnormality. No pneumothorax. IMPRESSION: 1. Cardiomegaly. 2. Large hiatal hernia. 3. Bibasilar atelectasis. 4. No acute displaced fracture.  No pneumothorax. 5. Herniated peritoneal fat abutting the right heart border as was visualized on the patient's recent prior CT dated 11/17/2019 Electronically Signed   By: Constance Holster M.D.   On: 02/23/2020 18:48   DG Knee 1-2 Views Left  Result Date: 02/23/2020 CLINICAL DATA:  Pain EXAM: LEFT KNEE - 1-2 VIEW COMPARISON:  None. FINDINGS: No evidence of fracture, dislocation, or joint effusion. Minimal tricompartmental degenerative changes are noted, greatest within the medial and patellofemoral compartments. IMPRESSION: Negative. Electronically Signed   By: Constance Holster M.D.   On: 02/23/2020 18:45   DG Tibia/Fibula Left  Result Date: 02/23/2020 CLINICAL DATA:  Pain status post fall EXAM: LEFT TIBIA AND FIBULA - 2 VIEW COMPARISON:  None. FINDINGS: There is no evidence of fracture or other focal bone lesions. Soft tissues are unremarkable. IMPRESSION: Negative. Electronically Signed   By: Constance Holster M.D.   On: 02/23/2020 18:46   DG Ankle 2 Views Left  Result Date: 02/23/2020 CLINICAL DATA:  Pain status post  fall EXAM: LEFT ANKLE - 2 VIEW COMPARISON:  None. FINDINGS: There is no evidence of fracture, dislocation, or joint effusion. There is no evidence of arthropathy or other focal bone abnormality. Soft tissues are unremarkable. IMPRESSION: Negative. Electronically Signed   By: Constance Holster M.D.   On: 02/23/2020 18:46   CT Head Wo Contrast  Result Date: 02/23/2020 CLINICAL DATA:  Head trauma, fall EXAM: CT HEAD WITHOUT CONTRAST TECHNIQUE: Contiguous axial images were obtained from the base of the skull through the vertex without intravenous contrast. COMPARISON:  Head CT 09/25/2018 FINDINGS: Brain: No acute territorial infarction, hemorrhage, or intracranial mass. Mild to moderate atrophy. Small chronic cerebellar infarcts. Mild hypodensity in the white matter consistent with chronic small vessel ischemic change. Stable ventricle size. Vascular: No hyperdense vessels. Minimal carotid vascular calcification. Skull: No fracture. Stable ground-glass lesion within the left para median frontal skull, benign given lack of interval change. Sinuses/Orbits: No acute finding. Other: Small right frontal scalp swelling. IMPRESSION: 1. No CT evidence for acute intracranial abnormality. 2. Atrophy and mild chronic small vessel ischemic changes of the white matter. Electronically Signed   By: Donavan Foil M.D.   On: 02/23/2020 18:06   DG Hip Unilat W or Wo Pelvis 2-3 Views Left  Result Date: 02/23/2020 CLINICAL DATA:  Pain EXAM: DG HIP (WITH OR WITHOUT PELVIS) 2-3V LEFT COMPARISON:  None. FINDINGS: There is an acute, displaced transcervical fracture of the proximal left femur. There is no dislocation. Mild  degenerative changes are noted of both hips. IMPRESSION: Acute, displaced transcervical fracture of the proximal left femur. Electronically Signed   By: Constance Holster M.D.   On: 02/23/2020 18:45      Assessment/Plan Principal Problem:   Displaced intertrochanteric fracture of left femur, initial encounter  for closed fracture [facility]) Active Problems:   Hypothyroidism   HYPERCHOLESTEROLEMIA   Bipolar disorder (HCC)   Asthma-chronic obstructive pulmonary disease overlap syndrome (HCC)   GERD (gastroesophageal reflux disease)   Osteoporosis   Alcohol abuse   Hypokalemia     #1 displaced intertrochanteric fracture of the left femur: Admit the patient.  Pain control.  Orthopedic consulted.  Continue PT with OT per orthopedics.  #2 ongoing alcohol abuse: Patient said her last drink was 2 days ago.  She is right at the level DT I will initiate CIWA protocol.  #3 bipolar disorder: Continue home regimen.  #4 hypothyroidism: Continue with levothyroxine  #5 asthma COPD: No exacerbation  #6 GERD: Continue with PPIs  #7 hyperlipidemia: Resume home regimen.  #8 Hypokalemia: Replete  #9 Osteoporosis: Continue treatment    DVT prophylaxis: Lovenox Code Status: Full code Family Communication: No family at bedside Disposition Plan: Home Consults called: [facility] Admission status: Inpatient  Severity of Illness: The appropriate patient status for this patient is INPATIENT. Inpatient status is judged to be reasonable and necessary in order to provide the required intensity of service to ensure the patient's safety. The patient's presenting symptoms, physical exam findings, and initial radiographic and laboratory data in the context of their chronic comorbidities is felt to place them at high risk for further clinical deterioration. Furthermore, it is not anticipated that the patient will be medically stable for discharge from the hospital within 2 midnights of admission. The following factors support the patient status of inpatient.   " The patient's presenting symptoms include fall with left hip pain. " The worrisome physical exam findings include angulated left lower extremity with tenderness. " The initial radiographic and laboratory data are worrisome because of evidence of right  femoral neck fracture. " The chronic co-morbidities include alcoholism.   * I certify that at the point of admission it is my clinical judgment that the patient will require inpatient hospital care spanning beyond 2 midnights from the point of admission due to high intensity of service, high risk for further deterioration and high frequency of surveillance required.Barbette Merino MD [facility] Pager [phone]  If 7PM-7AM, please contact night-coverage [email or web address] Password [email or web address]  02/23/2020, 8:59 PM

## 2020-02-23 NOTE — ED Provider Notes (Signed)
[facility] DEPT Provider Note   CSN: [id number] Arrival date & time: 02/23/20  1602     History Chief Complaint  Patient presents with  . Fall  . Hip Pain  . Back Pain    HAMSINI VERRILLI is a 69 y.o. female.   Fall This is a new problem. The current episode started yesterday. The problem occurs constantly. The problem has not changed since onset.Pertinent negatives include no chest pain, no abdominal pain, no headaches and no shortness of breath. Exacerbated by: movmenet of the leg, left. The symptoms are relieved by rest. She has tried nothing for the symptoms. The treatment provided mild relief.  Hip Pain Pertinent negatives include no chest pain, no abdominal pain, no headaches and no shortness of breath.  Back Pain Associated symptoms: no abdominal pain, no chest pain, no dysuria, no fever and no headaches        Past Medical History:  Diagnosis Date  . Anemia    low iron  . Anxiety   . Asthma   . Bipolar disorder ([place])   . Chronic back pain   . Compression fracture of L2 lumbar vertebra ([facility])   . DDD (degenerative disc disease)   . Depression   . Dyspnea   . GERD (gastroesophageal reflux disease)   . H/O alcohol abuse    28 years sober  . H/O suicide attempt   . Herpes   . History of hiatal hernia   . Hypothyroidism   . Migraines   . Osteoporosis 11/2018   T score of -2.6  . Pneumonia   . S/P adenoidectomy   . S/P tonsillectomy     Patient Active Problem List   Diagnosis Date Noted  . Intractable nausea and vomiting 08/31/2019  . Epigastric pain 08/31/2019  . Alcohol abuse 08/31/2019  . Microcytic anemia 08/31/2019  . Closed compression fracture of L2 vertebra ([place]) 12/11/2017  . Pain 07/26/2015  . Anxiety state 06/28/2015  . Benzodiazepine dependence ([place]) 10/13/2014  . Opioid abuse with opioid-induced mood disorder ([place]) 10/13/2014  . Bipolar 1 disorder, depressed ([place]) 10/12/2014  . Spondylolisthesis of  lumbar region 07/28/2014  . Osteoporosis, unspecified 11/17/2013  . Migraines   . Herpes   . Hypothyroidism 08/13/2008  . HYPERCHOLESTEROLEMIA 08/13/2008  . Bipolar disorder ([place]) 08/13/2008  . CHRONIC OBSTRUCTIVE ASTHMA UNSPECIFIED 08/13/2008  . GERD (gastroesophageal reflux disease) 08/13/2008  . HIATAL HERNIA 08/13/2008  . ALLERGY 08/13/2008    Past Surgical History:  Procedure Laterality Date  . CESAREAN SECTION    . COLONOSCOPY     as a teenager  . Compression fracture spine    . DILATION AND CURETTAGE OF UTERUS    . EYE SURGERY Bilateral    CATARACTS REMOVED, NO LENS PLACED  . HYSTEROSCOPY    . KYPHOPLASTY N/A 12/19/2016   Procedure: THORACIC 12 KYPHOPLASTY; REQUEST 60 MINS AND FLIP ROOM;  Surgeon: Phylliss Bob, MD;  Location: [facility];  Service: Orthopedics;  Laterality: N/A;  THORACIC 12 KYPHOPLASTY; REQUEST 60 MINS AND FLIP ROOM  . KYPHOPLASTY N/A 12/11/2017   Procedure: LUMBAR 2 KYPHOPLASTY;  Surgeon: Phylliss Bob, MD;  Location: [facility];  Service: Orthopedics;  Laterality: N/A;  . LUMBAR FUSION    . TONSILLECTOMY       OB History    Gravida  3   Para  1   Term  1   Preterm      AB  2   Living  1  SAB      TAB      Ectopic      Multiple      Live Births              Family History  Adopted: Yes    Social History   Tobacco Use  . Smoking status: Never Smoker  . Smokeless tobacco: Never Used  Vaping Use  . Vaping Use: Never used  Substance Use Topics  . Alcohol use: Yes    Alcohol/week: 0.0 standard drinks  . Drug use: Not Currently    Types: Benzodiazepines, Cocaine    Home Medications Prior to Admission medications   Medication Sig Start Date End Date Taking? Authorizing Provider  albuterol (PROVENTIL HFA;VENTOLIN HFA) 108 (90 Base) MCG/ACT inhaler Inhale 2 puffs into the lungs every 6 (six) hours as needed for wheezing or shortness of breath.    [provider]  buPROPion (WELLBUTRIN XL) 150 MG 24 hr tablet Take 300  mg by mouth daily.  10/01/16   [provider]  calcium citrate-vitamin D (CALCIUM + D) 315-200 MG-UNIT per tablet Take 1 tablet by mouth daily. For bone health 10/18/14   Lindell Spar I, NP  chlordiazePOXIDE (LIBRIUM) 25 MG capsule 50mg  PO TID x 1D, then 25-50mg  PO BID X 1D, then 25-50mg  PO QD X 1D 05/08/19   Petrucelli, Samantha R, PA-C  cyclobenzaprine (FLEXERIL) 10 MG tablet Take 10 mg by mouth 3 (three) times daily as needed for muscle spasms.     [provider]  denosumab (PROLIA) 60 MG/ML SOLN injection Inject 60 mg into the skin every 6 (six) months. Administer in upper arm, thigh, or abdomen  NEXT IS DUE 03-2017 Patient not taking: Reported on 11/19/2018 03/05/17   [name], NP  dicyclomine (BENTYL) 20 MG tablet Take 1 tablet (20 mg total) by mouth every 8 (eight) hours as needed for spasms. 12/31/19   Petrucelli, Samantha R, PA-C  EPINEPHrine 0.3 mg/0.3 mL IJ SOAJ injection Inject 0.3 mg into the skin as needed for anaphylaxis. 04/10/19   [provider]  famotidine (PEPCID) 20 MG tablet Take 1 tablet (20 mg total) by mouth at bedtime. 09/03/19 10/03/19  Damita Lack, MD  ferrous sulfate 325 (65 FE) MG tablet Take 1 tablet (325 mg total) by mouth 2 (two) times daily with a meal. 09/03/19 10/03/19  Damita Lack, MD  FLOVENT [facility] 110 MCG/ACT inhaler  07/15/19   [provider]  FLUoxetine (PROZAC) 20 MG capsule Take 20 mg by mouth 2 (two) times daily.     [provider]  gabapentin (NEURONTIN) 600 MG tablet Take 600 mg by mouth 3 (three) times daily.  11/05/16   [provider]  levothyroxine (SYNTHROID, LEVOTHROID) 112 MCG tablet Take 112 mcg by mouth daily before breakfast.  09/04/17   [provider]  methocarbamol (ROBAXIN) 500 MG tablet Take 500 mg by mouth 2 (two) times daily. 03/11/19   [provider]  Multiple Vitamin (MULTIVITAMIN) tablet Take 1 tablet by mouth daily. For low vitamin 10/18/14   Lindell Spar I,  NP  naproxen sodium (ALEVE) 220 MG tablet Take 220-440 mg by mouth 2 (two) times daily as needed (for pain).    [provider]  ondansetron (ZOFRAN ODT) 4 MG disintegrating tablet Take 1 tablet (4 mg total) by mouth every 8 (eight) hours as needed for nausea or vomiting. 12/18/19   Joy, Shawn C, PA-C  ondansetron (ZOFRAN) 8 MG tablet Take 8  mg by mouth every 8 (eight) hours as needed for nausea or vomiting (Migraine Head Ache symptoms).    [provider]  oxcarbazepine (TRILEPTAL) 600 MG tablet Take 600 mg by mouth 2 (two) times daily.     [provider]  OxyCODONE ER (XTAMPZA ER) 13.5 MG C12A Take 13.5 mg by mouth 2 (two) times daily. 11/22/16   [provider]  Oxycodone HCl 10 MG TABS Take 10 mg by mouth daily as needed (for breakthrough pain).  09/16/17   [provider]  pantoprazole (PROTONIX) 40 MG tablet Take 1 tablet (40 mg total) by mouth 2 (two) times daily before a meal. 09/03/19   Amin, Jeanella Flattery, MD  polyethylene glycol (MIRALAX / GLYCOLAX) 17 g packet Take 17 g by mouth daily as needed for moderate constipation or severe constipation. 09/03/19   Amin, Jeanella Flattery, MD  promethazine (PHENERGAN) 12.5 MG tablet Take 1 tablet (12.5 mg total) by mouth every 6 (six) hours as needed for nausea or vomiting. 12/31/19   Petrucelli, Samantha R, PA-C  QUEtiapine (SEROQUEL) 100 MG tablet Take 100-200 mg by mouth at bedtime.  10/12/16   [provider]  rizatriptan (MAXALT-MLT) 10 MG disintegrating tablet Take 10 mg by mouth as needed for migraine.  11/08/14   [provider]  sucralfate (CARAFATE) 1 g tablet Take 1 tablet (1 g total) by mouth 4 (four) times daily -  with meals and at bedtime. 12/18/19 01/17/20  Joy, Shawn C, PA-C  valACYclovir (VALTREX) 500 MG tablet Take one tablet twice daily 3-5 days then daily as needed. 05/01/17   [name], NP    Allergies    Prednisone, Lithium carbonate, and Sulfa antibiotics  Review of  Systems   Review of Systems  Constitutional: Negative for chills and fever.  HENT: Negative for congestion and rhinorrhea.   Respiratory: Negative for cough and shortness of breath.   Cardiovascular: Negative for chest pain and palpitations.  Gastrointestinal: Negative for abdominal pain, diarrhea, nausea and vomiting.  Genitourinary: Negative for difficulty urinating and dysuria.  Musculoskeletal: Positive for arthralgias. Negative for back pain.  Skin: Negative for rash and wound.  Neurological: Negative for light-headedness and headaches.    Physical Exam Updated Vital Signs BP (!) 179/102   Pulse 93   Temp 98.8 F (37.1 C) (Oral)   Resp 15   Ht 5\' 7"  (1.702 m)   Wt 76.2 kg   SpO2 100%   BMI 26.31 kg/m   Physical Exam Vitals and nursing note reviewed. Exam conducted with a chaperone present.  Constitutional:      General: She is not in acute distress.    Appearance: Normal appearance.  HENT:     Head: Normocephalic and atraumatic.     Nose: No rhinorrhea.  Eyes:     General:        Right eye: No discharge.        Left eye: No discharge.     Conjunctiva/sclera: Conjunctivae normal.  Cardiovascular:     Rate and Rhythm: Normal rate and regular rhythm.  Pulmonary:     Effort: Pulmonary effort is normal. No respiratory distress.     Breath sounds: No stridor.  Abdominal:     General: Abdomen is flat. There is no distension.     Palpations: Abdomen is soft.  Musculoskeletal:        General: Tenderness and signs of injury present.     Comments: Left leg shortened and internally rotated, neurovascularly intact,  no open wounds, pain to the left hip.  Tenderness to palpation as well.  No tenderness to palpation of the spine, pain throughout the entire leg  Skin:    General: Skin is warm and dry.  Neurological:     General: No focal deficit present.     Mental Status: She is alert. Mental status is at baseline.     Motor: No weakness.  Psychiatric:        Mood and  Affect: Mood normal.        Behavior: Behavior normal.     ED Results / Procedures / Treatments   Labs (all labs ordered are listed, but only abnormal results are displayed) Labs Reviewed  BASIC METABOLIC PANEL - Abnormal; Notable for the following components:      Result Value   Sodium 133 (*)    Potassium 3.2 (*)    Chloride 97 (*)    CO2 21 (*)    Glucose, Bld 188 (*)    All other components within normal limits  CBC WITH DIFFERENTIAL/PLATELET - Abnormal; Notable for the following components:   WBC 14.4 (*)    MCH 25.4 (*)    Neutro Abs 13.0 (*)    Lymphs Abs 0.4 (*)    Abs Immature Granulocytes 0.09 (*)    All other components within normal limits  CK - Abnormal; Notable for the following components:   Total CK 373 (*)    All other components within normal limits  CBG MONITORING, ED - Abnormal; Notable for the following components:   Glucose-Capillary 195 (*)    All other components within normal limits  RESP PANEL BY RT-PCR (FLU A&B, COVID) [id number]  PROTIME-INR  TYPE AND SCREEN    EKG None  Radiology DG Chest 1 View  Result Date: 02/23/2020 CLINICAL DATA:  Pain status post fall EXAM: CHEST  1 VIEW COMPARISON:  04/25/2018 FINDINGS: The heart size is enlarged, increased in size from prior study. There is a large hiatal hernia. The right heart border is indistinct, likely secondary to herniated peritoneal fat abutting the right heart border. The trachea is nearly midline. There is probable atelectasis at the lung bases bilaterally. There is no acute osseous abnormality. No pneumothorax. IMPRESSION: 1. Cardiomegaly. 2. Large hiatal hernia. 3. Bibasilar atelectasis. 4. No acute displaced fracture.  No pneumothorax. 5. Herniated peritoneal fat abutting the right heart border as was visualized on the patient's recent prior CT dated 11/17/2019 Electronically Signed   By: Constance Holster M.D.   On: 02/23/2020 18:48   DG Knee 1-2 Views Left  Result Date: 02/23/2020 CLINICAL  DATA:  Pain EXAM: LEFT KNEE - 1-2 VIEW COMPARISON:  None. FINDINGS: No evidence of fracture, dislocation, or joint effusion. Minimal tricompartmental degenerative changes are noted, greatest within the medial and patellofemoral compartments. IMPRESSION: Negative. Electronically Signed   By: Constance Holster M.D.   On: 02/23/2020 18:45   DG Tibia/Fibula Left  Result Date: 02/23/2020 CLINICAL DATA:  Pain status post fall EXAM: LEFT TIBIA AND FIBULA - 2 VIEW COMPARISON:  None. FINDINGS: There is no evidence of fracture or other focal bone lesions. Soft tissues are unremarkable. IMPRESSION: Negative. Electronically Signed   By: Constance Holster M.D.   On: 02/23/2020 18:46   DG Ankle 2 Views Left  Result Date: 02/23/2020 CLINICAL DATA:  Pain status post fall EXAM: LEFT ANKLE - 2 VIEW COMPARISON:  None. FINDINGS: There is no evidence of fracture, dislocation, or joint effusion. There is no evidence of arthropathy  or other focal bone abnormality. Soft tissues are unremarkable. IMPRESSION: Negative. Electronically Signed   By: Constance Holster M.D.   On: 02/23/2020 18:46   CT Head Wo Contrast  Result Date: 02/23/2020 CLINICAL DATA:  Head trauma, fall EXAM: CT HEAD WITHOUT CONTRAST TECHNIQUE: Contiguous axial images were obtained from the base of the skull through the vertex without intravenous contrast. COMPARISON:  Head CT 09/25/2018 FINDINGS: Brain: No acute territorial infarction, hemorrhage, or intracranial mass. Mild to moderate atrophy. Small chronic cerebellar infarcts. Mild hypodensity in the white matter consistent with chronic small vessel ischemic change. Stable ventricle size. Vascular: No hyperdense vessels. Minimal carotid vascular calcification. Skull: No fracture. Stable ground-glass lesion within the left para median frontal skull, benign given lack of interval change. Sinuses/Orbits: No acute finding. Other: Small right frontal scalp swelling. IMPRESSION: 1. No CT evidence for acute  intracranial abnormality. 2. Atrophy and mild chronic small vessel ischemic changes of the white matter. Electronically Signed   By: Donavan Foil M.D.   On: 02/23/2020 18:06   DG Hip Unilat W or Wo Pelvis 2-3 Views Left  Result Date: 02/23/2020 CLINICAL DATA:  Pain EXAM: DG HIP (WITH OR WITHOUT PELVIS) 2-3V LEFT COMPARISON:  None. FINDINGS: There is an acute, displaced transcervical fracture of the proximal left femur. There is no dislocation. Mild degenerative changes are noted of both hips. IMPRESSION: Acute, displaced transcervical fracture of the proximal left femur. Electronically Signed   By: Constance Holster M.D.   On: 02/23/2020 18:45    Procedures Procedures (including critical care time)  Medications Ordered in ED Medications  morphine 4 MG/ML injection 4 mg (4 mg Intravenous Given 02/23/20 1858)  ondansetron (ZOFRAN) injection 4 mg (4 mg Intravenous Given 02/23/20 1709)    ED Course  I have reviewed the triage vital signs and the nursing notes.  Pertinent labs & imaging results that were available during my care of the patient were reviewed by me and considered in my medical decision making (see chart for details).    MDM Rules/Calculators/A&P                          Fall, mechanical in origin, CT imaging x-ray imaging reviewed by myself radiology only shows a acute fracture of the left hip.  Ortho consulted recommends admission and probable surgical repair tomorrow.  No other injury found reported.  Screening laboratory studies done as well.  Patient will be admitted to medicine Final Clinical Impression(s) / ED Diagnoses Final diagnoses:  Fall, initial encounter  Closed fracture of left hip, initial encounter [facility])    Rx / DC Orders ED Discharge Orders    None       Breck Coons, MD 02/23/20 1910

## 2020-02-23 NOTE — ED Notes (Signed)
ED TO INPATIENT HANDOFF REPORT  Name/Age/Gender Victoria Levine 69 y.o. female  Code Status    Code Status Orders  (From admission, onward)         Start     Ordered   02/23/20 2043  Full code  Continuous        02/23/20 2042        Code Status History    Date Active Date Inactive Code Status Order ID Comments User Context   08/31/2019 1227 09/04/2019 1718 Full Code [id number]  Vernelle Emerald, MD Inpatient   02/01/2018 1448 02/03/2018 1327 Full Code [id number]  Recardo Evangelist, PA-C ED   12/11/2017 1758 12/12/2017 1033 Full Code [id number]  Phylliss Bob, MD Inpatient   10/12/2014 1414 10/18/2014 1703 Full Code [id number]  Niel Hummer, NP Inpatient   10/11/2014 1215 10/12/2014 1414 Full Code [id number]  Waynetta Pean, PA-C ED   07/28/2014 1321 08/02/2014 1726 Full Code [id number]  Leeroy Cha, MD Inpatient   Advance Care Planning Activity      Home/SNF/Other Rehab  Chief Complaint Displaced intertrochanteric fracture of left femur, initial encounter for closed fracture ([place]) [S72.142A]  Level of Care/Admitting Diagnosis ED Disposition    ED Disposition Condition [place]: [facility] [[id number]]  Level of Care: Med-Surg [16]  May admit patient to [facility] or [facility] if equivalent level of care is available:: No  Covid Evaluation: Asymptomatic Screening Protocol (No Symptoms)  Diagnosis: Displaced intertrochanteric fracture of left femur, initial encounter for closed fracture [facility]) [[id number]]  Admitting Physician: Elwyn Reach [[id number]]  Attending Physician: Elwyn Reach [[id number]]  Estimated length of stay: past midnight tomorrow  Certification:: I certify this patient will need inpatient services for at least 2 midnights       Medical History Past Medical History:  Diagnosis Date  . Anemia    low iron  . Anxiety   . Asthma   . Bipolar disorder ([place])   . Chronic back pain   . Compression fracture of L2  lumbar vertebra (H[facility])   . DDD (degenerative disc disease)   . Depression   . Dyspnea   . GERD (gastroesophageal reflux disease)   . H/O alcohol abuse    28 years sober  . H/O suicide attempt   . Herpes   . History of hiatal hernia   . Hypothyroidism   . Migraines   . Osteoporosis 11/2018   T score of -2.6  . Pneumonia   . S/P adenoidectomy   . S/P tonsillectomy     Allergies Allergies  Allergen Reactions  . Prednisone Other (See Comments)    Cannot tolerate ORALLY, but can tolerate by shot (interacts with anxiety & bipolarity)  . Lithium Carbonate Other (See Comments)    Patient was taken off of this because of a weight gain of 40 pounds  . Sulfa Antibiotics Other (See Comments)    From childhood; reaction not recalled    IV Location/Drains/Wounds Patient Lines/Drains/Airways Status    Active Line/Drains/Airways    Name Placement date Placement time Site Days   Peripheral IV 02/23/20 Left Antecubital 02/23/20  1701  Antecubital  less than 1          Labs/Imaging Results for orders placed or performed during the hospital encounter of 02/23/20 (from the past 48 hour(s))  CBG monitoring, ED     Status: Abnormal   Collection Time: 02/23/20  4:53 PM  Result Value Ref Range  Glucose-Capillary 195 (H) 70 - 99 mg/dL    Comment: Glucose reference range applies only to samples taken after fasting for at least 8 hours.  Basic metabolic panel     Status: Abnormal   Collection Time: 02/23/20  5:02 PM  Result Value Ref Range   Sodium 133 (L) 135 - 145 mmol/L   Potassium 3.2 (L) 3.5 - 5.1 mmol/L   Chloride 97 (L) 98 - 111 mmol/L   CO2 21 (L) 22 - 32 mmol/L   Glucose, Bld 188 (H) 70 - 99 mg/dL    Comment: Glucose reference range applies only to samples taken after fasting for at least 8 hours.   BUN 9 8 - 23 mg/dL   Creatinine, Ser 0.62 0.44 - 1.00 mg/dL   Calcium 9.9 8.9 - 10.3 mg/dL   GFR, Estimated >60 >60 mL/min    Comment: (NOTE) Calculated using the CKD-EPI  Creatinine Equation (2021)    Anion gap 15 5 - 15    Comment: Performed at [facility], [place] [address] [place], [place] [address]  CBC WITH DIFFERENTIAL     Status: Abnormal   Collection Time: 02/23/20  5:02 PM  Result Value Ref Range   WBC 14.4 (H) 4.0 - 10.5 K/uL   RBC 5.04 3.87 - 5.11 MIL/uL   Hemoglobin 12.8 12.0 - 15.0 g/dL   HCT 41.5 36 - 46 %   MCV 82.3 80.0 - 100.0 fL   MCH 25.4 (L) 26.0 - 34.0 pg   MCHC 30.8 30.0 - 36.0 g/dL   RDW 14.8 11.5 - 15.5 %   Platelets 362 150 - 400 K/uL   nRBC 0.0 0.0 - 0.2 %   Neutrophils Relative % 90 %   Neutro Abs 13.0 (H) 1.7 - 7.7 K/uL   Lymphocytes Relative 3 %   Lymphs Abs 0.4 (L) 0.7 - 4.0 K/uL   Monocytes Relative 6 %   Monocytes Absolute 0.9 0.1 - 1.0 K/uL   Eosinophils Relative 0 %   Eosinophils Absolute 0.0 0.0 - 0.5 K/uL   Basophils Relative 0 %   Basophils Absolute 0.0 0.0 - 0.1 K/uL   Immature Granulocytes 1 %   Abs Immature Granulocytes 0.09 (H) 0.00 - 0.07 K/uL    Comment: Performed at [facility], [place] [address] [place], [place] [address]  Protime-INR     Status: None   Collection Time: 02/23/20  5:02 PM  Result Value Ref Range   Prothrombin Time 13.8 11.4 - 15.2 seconds   INR 1.1 0.8 - 1.2    Comment: (NOTE) INR goal varies based on device and disease states. Performed at [facility], [place] [address] [place], [place] [address]   Type and screen [place]     Status: None   Collection Time: 02/23/20  5:02 PM  Result Value Ref Range   ABO/RH(D) AB POS    Antibody Screen NEG    Sample Expiration      02/26/2020,2359 Performed at [facility], [place] [address] [place], [place] [address]   CK     Status: Abnormal   Collection Time: 02/23/20  5:02 PM  Result Value Ref Range   Total CK 373 (H) 38.0 - 234.0 U/L    Comment: Performed at [facility], [place] [address] [place], [place] [address]  Resp  Panel by RT-PCR (Flu A&B, Covid) Nasopharyngeal Swab     Status: None   Collection Time: 02/23/20  6:48 PM   Specimen: Nasopharyngeal Swab; Nasopharyngeal(NP) swabs in vial transport medium  Result Value Ref Range   SARS Coronavirus 2 by RT PCR NEGATIVE NEGATIVE    Comment: (NOTE) SARS-CoV-2 target nucleic acids are NOT DETECTED.  The SARS-CoV-2 RNA is generally detectable in upper respiratory specimens during the acute phase of infection. The lowest concentration of SARS-CoV-2 viral copies this assay can detect is 138 copies/mL. A negative result does not preclude SARS-Cov-2 infection and should not be used as the sole basis for treatment or other patient management decisions. A negative result may occur with  improper specimen collection/handling, submission of specimen other than nasopharyngeal swab, presence of viral mutation(s) within the areas targeted by this assay, and inadequate number of viral copies(<138 copies/mL). A negative result must be combined with clinical observations, patient history, and epidemiological information. The expected result is Negative.  Fact Sheet for Patients:  EntrepreneurPulse.com.au  Fact Sheet for Healthcare Providers:  IncredibleEmployment.be  This test is no t yet approved or cleared by the [place] FDA and  has been authorized for detection and/or diagnosis of SARS-CoV-2 by FDA under an Emergency Use Authorization (EUA). This EUA will remain  in effect (meaning this test can be used) for the duration of the COVID-19 declaration under Section 564(b)(1) of the Act, 21 U.S.C.section 360bbb-3(b)(1), unless the authorization is terminated  or revoked sooner.       Influenza A by PCR NEGATIVE NEGATIVE   Influenza B by PCR NEGATIVE NEGATIVE    Comment: (NOTE) The Xpert Xpress SARS-CoV-2/FLU/RSV plus assay is intended as an aid in the diagnosis of influenza from Nasopharyngeal swab specimens  and should not be used as a sole basis for treatment. Nasal washings and aspirates are unacceptable for Xpert Xpress SARS-CoV-2/FLU/RSV testing.  Fact Sheet for Patients: EntrepreneurPulse.com.au  Fact Sheet for Healthcare Providers: IncredibleEmployment.be  This test is not yet approved or cleared by the [place] FDA and has been authorized for detection and/or diagnosis of SARS-CoV-2 by FDA under an Emergency Use Authorization (EUA). This EUA will remain in effect (meaning this test can be used) for the duration of the COVID-19 declaration under Section 564(b)(1) of the Act, 21 U.S.C. section 360bbb-3(b)(1), unless the authorization is terminated or revoked.  Performed at [facility], [place] [address] [place], [place] [address]    DG Chest 1 View  Result Date: 02/23/2020 CLINICAL DATA:  Pain status post fall EXAM: CHEST  1 VIEW COMPARISON:  04/25/2018 FINDINGS: The heart size is enlarged, increased in size from prior study. There is a large hiatal hernia. The right heart border is indistinct, likely secondary to herniated peritoneal fat abutting the right heart border. The trachea is nearly midline. There is probable atelectasis at the lung bases bilaterally. There is no acute osseous abnormality. No pneumothorax. IMPRESSION: 1. Cardiomegaly. 2. Large hiatal hernia. 3. Bibasilar atelectasis. 4. No acute displaced fracture.  No pneumothorax. 5. Herniated peritoneal fat abutting the right heart border as was visualized on the patient's recent prior CT dated 11/17/2019 Electronically Signed   By: Constance Holster M.D.   On: 02/23/2020 18:48   DG Knee 1-2 Views Left  Result Date: 02/23/2020 CLINICAL DATA:  Pain EXAM: LEFT KNEE - 1-2 VIEW COMPARISON:  None. FINDINGS: No evidence of fracture, dislocation, or joint effusion. Minimal tricompartmental degenerative changes are noted, greatest within the medial and patellofemoral  compartments. IMPRESSION: Negative. Electronically Signed   By: Constance Holster M.D.   On: 02/23/2020 18:45   DG  Tibia/Fibula Left  Result Date: 02/23/2020 CLINICAL DATA:  Pain status post fall EXAM: LEFT TIBIA AND FIBULA - 2 VIEW COMPARISON:  None. FINDINGS: There is no evidence of fracture or other focal bone lesions. Soft tissues are unremarkable. IMPRESSION: Negative. Electronically Signed   By: Constance Holster M.D.   On: 02/23/2020 18:46   DG Ankle 2 Views Left  Result Date: 02/23/2020 CLINICAL DATA:  Pain status post fall EXAM: LEFT ANKLE - 2 VIEW COMPARISON:  None. FINDINGS: There is no evidence of fracture, dislocation, or joint effusion. There is no evidence of arthropathy or other focal bone abnormality. Soft tissues are unremarkable. IMPRESSION: Negative. Electronically Signed   By: Constance Holster M.D.   On: 02/23/2020 18:46   CT Head Wo Contrast  Result Date: 02/23/2020 CLINICAL DATA:  Head trauma, fall EXAM: CT HEAD WITHOUT CONTRAST TECHNIQUE: Contiguous axial images were obtained from the base of the skull through the vertex without intravenous contrast. COMPARISON:  Head CT 09/25/2018 FINDINGS: Brain: No acute territorial infarction, hemorrhage, or intracranial mass. Mild to moderate atrophy. Small chronic cerebellar infarcts. Mild hypodensity in the white matter consistent with chronic small vessel ischemic change. Stable ventricle size. Vascular: No hyperdense vessels. Minimal carotid vascular calcification. Skull: No fracture. Stable ground-glass lesion within the left para median frontal skull, benign given lack of interval change. Sinuses/Orbits: No acute finding. Other: Small right frontal scalp swelling. IMPRESSION: 1. No CT evidence for acute intracranial abnormality. 2. Atrophy and mild chronic small vessel ischemic changes of the white matter. Electronically Signed   By: Donavan Foil M.D.   On: 02/23/2020 18:06   DG Hip Unilat W or Wo Pelvis 2-3 Views Left  Result  Date: 02/23/2020 CLINICAL DATA:  Pain EXAM: DG HIP (WITH OR WITHOUT PELVIS) 2-3V LEFT COMPARISON:  None. FINDINGS: There is an acute, displaced transcervical fracture of the proximal left femur. There is no dislocation. Mild degenerative changes are noted of both hips. IMPRESSION: Acute, displaced transcervical fracture of the proximal left femur. Electronically Signed   By: Constance Holster M.D.   On: 02/23/2020 18:45    Pending Labs Unresulted Labs (From admission, onward)          Start     Ordered   03/01/20 0500  Creatinine, serum  (enoxaparin (LOVENOX)    CrCl >/= 30 ml/min)  Weekly,   R     Comments: while on enoxaparin therapy    02/23/20 2042   02/24/20 0500  CBC  Tomorrow morning,   R        02/23/20 2042   02/24/20 0500  Comprehensive metabolic panel  Tomorrow morning,   R        02/23/20 2042   Signed and Held  Comprehensive metabolic panel  Once,   R        Signed and Held   Signed and Held  Magnesium  Once,   R        Signed and Held   Signed and Held  Phosphorus  Once,   R        Signed and Held   Signed and Held  CBC  Once,   R        Signed and Held          Vitals/Pain Today's Vitals   02/23/20 2030 02/23/20 2031 02/23/20 2100 02/23/20 2113  BP: (!) 155/97 (!) 155/97 (!) 157/88   Pulse: 92 92 87   Resp: 16 (!) 22 20   Temp:  TempSrc:      SpO2: 93% 91% 99%   Weight:      Height:      PainSc:    2     Isolation Precautions No active isolations  Medications Medications  enoxaparin (LOVENOX) injection 40 mg (has no administration in time range)  0.9 %  sodium chloride infusion ( Intravenous New Bag/Given 02/23/20 2112)  ondansetron (ZOFRAN) tablet 4 mg (has no administration in time range)    Or  ondansetron (ZOFRAN) injection 4 mg (has no administration in time range)  HYDROmorphone (DILAUDID) injection 0.5-1 mg (has no administration in time range)  acetaminophen (TYLENOL) tablet 650 mg (has no administration in time range)    Or   acetaminophen (TYLENOL) suppository 650 mg (has no administration in time range)  morphine 4 MG/ML injection 4 mg (4 mg Intravenous Given 02/23/20 1858)  ondansetron (ZOFRAN) injection 4 mg (4 mg Intravenous Given 02/23/20 1709)    Mobility non-ambulatory

## 2020-02-23 NOTE — ED Triage Notes (Signed)
Pt BIB EMS from home. EMS reports pt had an unwitnessed fall sometime last night and was found on the floor by her neighbor this afternoon. Pt possibly on the floor for 12+ hour. Pt c/o left hip pain and lower back pain. Denies LOC, unsure if pt hit head, pt not on blood thinners. Left hip tender upon palpation, no deformity noted.

## 2020-02-24 ENCOUNTER — Inpatient Hospital Stay: Admit: 2020-02-24 | Payer: Medicare HMO | Admitting: Orthopedic Surgery

## 2020-02-24 ENCOUNTER — Inpatient Hospital Stay (HOSPITAL_COMMUNITY): Payer: Medicare HMO

## 2020-02-24 ENCOUNTER — Encounter (HOSPITAL_COMMUNITY)
Admission: EM | Disposition: A | Discharge status: Skilled Nursing Facility | Payer: Self-pay | Source: Home / Self Care | Attending: Internal Medicine

## 2020-02-24 ENCOUNTER — Encounter (HOSPITAL_COMMUNITY): Payer: Self-pay | Admitting: Internal Medicine

## 2020-02-24 ENCOUNTER — Inpatient Hospital Stay (HOSPITAL_COMMUNITY): Payer: Medicare HMO | Admitting: Certified Registered Nurse Anesthetist

## 2020-02-24 HISTORY — PX: TOTAL HIP ARTHROPLASTY: SHX124

## 2020-02-24 LAB — COMPREHENSIVE METABOLIC PANEL
ALT: 16 U/L (ref 0–44)
AST: 21 U/L (ref 15–41)
Albumin: 3.7 g/dL (ref 3.5–5.0)
Alkaline Phosphatase: 58 U/L (ref 38–126)
Anion gap: 10 (ref 5–15)
BUN: 13 mg/dL (ref 8–23)
CO2: 22 mmol/L (ref 22–32)
Calcium: 8.8 mg/dL — ABNORMAL LOW (ref 8.9–10.3)
Chloride: 103 mmol/L (ref 98–111)
Creatinine, Ser: 0.8 mg/dL (ref 0.44–1.00)
GFR, Estimated: >60 mL/min (ref >60)
Glucose, Bld: 124 mg/dL — ABNORMAL HIGH (ref 70–99)
Potassium: 3 mmol/L — ABNORMAL LOW (ref 3.5–5.1)
Sodium: 135 mmol/L (ref 135–145)
Total Bilirubin: 0.7 mg/dL (ref 0.3–1.2)
Total Protein: 6.7 g/dL (ref 6.5–8.1)

## 2020-02-24 LAB — PHOSPHORUS: Phosphorus: 2.9 mg/dL (ref 2.5–4.6)

## 2020-02-24 LAB — CBC
HCT: 38 % (ref 36.0–46.0)
Hemoglobin: 11.3 g/dL — ABNORMAL LOW (ref 12.0–15.0)
MCH: 25.4 pg — ABNORMAL LOW (ref 26.0–34.0)
MCHC: 29.7 g/dL — ABNORMAL LOW (ref 30.0–36.0)
MCV: 85.4 fL (ref 80.0–100.0)
Platelets: 269 10*3/uL (ref 150–400)
RBC: 4.45 MIL/uL (ref 3.87–5.11)
RDW: 15.5 % (ref 11.5–15.5)
WBC: 13.2 10*3/uL — ABNORMAL HIGH (ref 4.0–10.5)
nRBC: 0 % (ref 0.0–0.2)

## 2020-02-24 LAB — MAGNESIUM
Magnesium: 1.8 mg/dL (ref 1.7–2.4)
Magnesium: 1.9 mg/dL (ref 1.7–2.4)

## 2020-02-24 LAB — SURGICAL PCR SCREEN
MRSA, PCR: NEGATIVE
Staphylococcus aureus: NEGATIVE

## 2020-02-24 LAB — POTASSIUM: Potassium: 3.1 mmol/L — ABNORMAL LOW (ref 3.5–5.1)

## 2020-02-24 SURGERY — ARTHROPLASTY, HIP, TOTAL, ANTERIOR APPROACH
Anesthesia: General | Site: Hip | Laterality: Left

## 2020-02-24 MED ORDER — OXYCODONE HCL 5 MG PO TABS
10.0000 mg | ORAL_TABLET | Freq: Every day | ORAL | Status: DC | PRN
  Administered 2020-02-24 – 2020-02-26 (×2): 10 mg via ORAL
  Filled 2020-02-24 (×3): qty 2

## 2020-02-24 MED ORDER — OXCARBAZEPINE 300 MG PO TABS
600.0000 mg | ORAL_TABLET | Freq: Every day | ORAL | Status: DC
  Administered 2020-02-24 – 2020-03-03 (×9): 600 mg via ORAL
  Filled 2020-02-24 (×9): qty 2

## 2020-02-24 MED ORDER — FENTANYL CITRATE (PF) 100 MCG/2ML IJ SOLN
INTRAMUSCULAR | Status: AC
  Filled 2020-02-24: qty 2

## 2020-02-24 MED ORDER — CHLORHEXIDINE GLUCONATE 4 % EX LIQD: 60.0000 mL | Freq: Once | CUTANEOUS | Status: DC

## 2020-02-24 MED ORDER — POLYETHYLENE GLYCOL 3350 17 G PO PACK
17.0000 g | PACK | Freq: Every day | ORAL | Status: DC | PRN
  Administered 2020-02-26: 17 g via ORAL
  Filled 2020-02-24: qty 1

## 2020-02-24 MED ORDER — FLUOXETINE HCL 20 MG PO CAPS
20.0000 mg | ORAL_CAPSULE | Freq: Two times a day (BID) | ORAL | Status: DC
  Administered 2020-02-24 – 2020-03-03 (×16): 20 mg via ORAL
  Filled 2020-02-24 (×17): qty 1

## 2020-02-24 MED ORDER — DOCUSATE SODIUM 100 MG PO CAPS
100.0000 mg | ORAL_CAPSULE | Freq: Two times a day (BID) | ORAL | Status: DC
  Administered 2020-02-24 – 2020-03-03 (×15): 100 mg via ORAL
  Filled 2020-02-24 (×16): qty 1

## 2020-02-24 MED ORDER — PHENOL 1.4 % MT LIQD: 1.0000 | OROMUCOSAL | Status: DC | PRN

## 2020-02-24 MED ORDER — BUPIVACAINE-EPINEPHRINE (PF) 0.25% -1:200000 IJ SOLN
INTRAMUSCULAR | Status: AC
  Filled 2020-02-24: qty 30

## 2020-02-24 MED ORDER — LIDOCAINE HCL (PF) 2 % IJ SOLN
INTRAMUSCULAR | Status: AC
  Filled 2020-02-24: qty 5

## 2020-02-24 MED ORDER — PROPOFOL 10 MG/ML IV BOLUS
INTRAVENOUS | Status: AC
  Filled 2020-02-24: qty 20

## 2020-02-24 MED ORDER — DEXAMETHASONE SODIUM PHOSPHATE 10 MG/ML IJ SOLN
INTRAMUSCULAR | Status: AC
  Filled 2020-02-24: qty 1

## 2020-02-24 MED ORDER — BUDESONIDE 0.25 MG/2ML IN SUSP
0.2500 mg | Freq: Two times a day (BID) | RESPIRATORY_TRACT | Status: DC
  Administered 2020-02-24 – 2020-03-03 (×14): 0.25 mg via RESPIRATORY_TRACT
  Filled 2020-02-24 (×16): qty 2

## 2020-02-24 MED ORDER — CEFAZOLIN SODIUM-DEXTROSE 2-4 GM/100ML-% IV SOLN
2.0000 g | INTRAVENOUS | Status: AC
  Administered 2020-02-24: 2 g via INTRAVENOUS
  Filled 2020-02-24: qty 100

## 2020-02-24 MED ORDER — POVIDONE-IODINE 10 % EX SWAB
2.0000 "application " | Freq: Once | CUTANEOUS | Status: AC
  Administered 2020-02-24: 2 via TOPICAL

## 2020-02-24 MED ORDER — PHENYLEPHRINE 40 MCG/ML (10ML) SYRINGE FOR IV PUSH (FOR BLOOD PRESSURE SUPPORT)
PREFILLED_SYRINGE | INTRAVENOUS | Status: AC
  Filled 2020-02-24: qty 10

## 2020-02-24 MED ORDER — SUGAMMADEX SODIUM 200 MG/2ML IV SOLN
INTRAVENOUS | Status: DC | PRN
  Administered 2020-02-24: 200 mg via INTRAVENOUS

## 2020-02-24 MED ORDER — MIDAZOLAM HCL 2 MG/2ML IJ SOLN
INTRAMUSCULAR | Status: AC
  Filled 2020-02-24: qty 2

## 2020-02-24 MED ORDER — SODIUM CHLORIDE 0.9 % IR SOLN
Status: DC | PRN
  Administered 2020-02-24: 3000 mL

## 2020-02-24 MED ORDER — OXYCODONE HCL ER 10 MG PO T12A
10.0000 mg | EXTENDED_RELEASE_TABLET | Freq: Two times a day (BID) | ORAL | Status: DC
  Administered 2020-02-24 – 2020-03-03 (×16): 10 mg via ORAL
  Filled 2020-02-24 (×16): qty 1

## 2020-02-24 MED ORDER — CEFAZOLIN SODIUM-DEXTROSE 2-4 GM/100ML-% IV SOLN
2.0000 g | Freq: Four times a day (QID) | INTRAVENOUS | Status: AC
  Administered 2020-02-24 – 2020-02-25 (×2): 2 g via INTRAVENOUS
  Filled 2020-02-24 (×2): qty 100

## 2020-02-24 MED ORDER — GABAPENTIN 300 MG PO CAPS
600.0000 mg | ORAL_CAPSULE | Freq: Every day | ORAL | Status: DC | PRN
  Administered 2020-02-25: 600 mg via ORAL
  Filled 2020-02-24: qty 2

## 2020-02-24 MED ORDER — PROPOFOL 10 MG/ML IV BOLUS
INTRAVENOUS | Status: DC | PRN
  Administered 2020-02-24: 40 mg via INTRAVENOUS
  Administered 2020-02-24: 100 mg via INTRAVENOUS

## 2020-02-24 MED ORDER — PHENYLEPHRINE HCL-NACL 10-0.9 MG/250ML-% IV SOLN
INTRAVENOUS | Status: DC | PRN
  Administered 2020-02-24: 50 ug/min via INTRAVENOUS

## 2020-02-24 MED ORDER — DICYCLOMINE HCL 20 MG PO TABS
20.0000 mg | ORAL_TABLET | Freq: Three times a day (TID) | ORAL | Status: DC | PRN
  Filled 2020-02-24 (×2): qty 1

## 2020-02-24 MED ORDER — ONDANSETRON HCL 4 MG/2ML IJ SOLN: 4.0000 mg | Freq: Once | INTRAMUSCULAR | Status: DC | PRN

## 2020-02-24 MED ORDER — ONDANSETRON HCL 4 MG PO TABS
4.0000 mg | ORAL_TABLET | Freq: Four times a day (QID) | ORAL | Status: DC | PRN
  Administered 2020-02-26: 4 mg via ORAL
  Filled 2020-02-24: qty 1

## 2020-02-24 MED ORDER — POVIDONE-IODINE 10 % EX SWAB: 2.0000 "application " | Freq: Once | CUTANEOUS | Status: DC

## 2020-02-24 MED ORDER — FAMOTIDINE 20 MG PO TABS
20.0000 mg | ORAL_TABLET | Freq: Every day | ORAL | Status: DC
  Administered 2020-02-24 – 2020-03-02 (×8): 20 mg via ORAL
  Filled 2020-02-24 (×8): qty 1

## 2020-02-24 MED ORDER — SUCRALFATE 1 G PO TABS
1.0000 g | ORAL_TABLET | Freq: Three times a day (TID) | ORAL | Status: DC
  Administered 2020-02-24 – 2020-03-03 (×27): 1 g via ORAL
  Filled 2020-02-24 (×27): qty 1

## 2020-02-24 MED ORDER — ROCURONIUM BROMIDE 10 MG/ML (PF) SYRINGE
PREFILLED_SYRINGE | INTRAVENOUS | Status: DC | PRN
  Administered 2020-02-24: 10 mg via INTRAVENOUS
  Administered 2020-02-24: 40 mg via INTRAVENOUS

## 2020-02-24 MED ORDER — MENTHOL 3 MG MT LOZG
1.0000 | LOZENGE | OROMUCOSAL | Status: DC | PRN
  Administered 2020-03-01: 3 mg via ORAL
  Filled 2020-02-24: qty 9

## 2020-02-24 MED ORDER — PHENYLEPHRINE HCL (PRESSORS) 10 MG/ML IV SOLN
INTRAVENOUS | Status: AC
  Filled 2020-02-24: qty 1

## 2020-02-24 MED ORDER — ENOXAPARIN SODIUM 40 MG/0.4ML ~~LOC~~ SOLN
40.0000 mg | SUBCUTANEOUS | Status: DC
  Administered 2020-02-25 – 2020-03-03 (×7): 40 mg via SUBCUTANEOUS
  Filled 2020-02-24 (×8): qty 0.4

## 2020-02-24 MED ORDER — RIZATRIPTAN BENZOATE 10 MG PO TBDP: 10.0000 mg | ORAL_TABLET | Freq: Every day | ORAL | Status: DC | PRN

## 2020-02-24 MED ORDER — ONDANSETRON HCL 4 MG/2ML IJ SOLN
INTRAMUSCULAR | Status: DC | PRN
  Administered 2020-02-24: 4 mg via INTRAVENOUS

## 2020-02-24 MED ORDER — 0.9 % SODIUM CHLORIDE (POUR BTL) OPTIME
TOPICAL | Status: DC | PRN
  Administered 2020-02-24: 1000 mL

## 2020-02-24 MED ORDER — BUPIVACAINE-EPINEPHRINE 0.25% -1:200000 IJ SOLN
INTRAMUSCULAR | Status: DC | PRN
  Administered 2020-02-24: 30 mL

## 2020-02-24 MED ORDER — FENTANYL CITRATE (PF) 100 MCG/2ML IJ SOLN
INTRAMUSCULAR | Status: DC | PRN
  Administered 2020-02-24: 50 ug via INTRAVENOUS
  Administered 2020-02-24: 75 ug via INTRAVENOUS
  Administered 2020-02-24: 25 ug via INTRAVENOUS

## 2020-02-24 MED ORDER — ENSURE PRE-SURGERY PO LIQD
296.0000 mL | Freq: Once | ORAL | Status: DC
  Filled 2020-02-24: qty 296

## 2020-02-24 MED ORDER — MIDAZOLAM HCL 2 MG/2ML IJ SOLN
INTRAMUSCULAR | Status: DC | PRN
  Administered 2020-02-24: 1 mg via INTRAVENOUS

## 2020-02-24 MED ORDER — CALCIUM CARBONATE-VITAMIN D 500-200 MG-UNIT PO TABS
1.0000 | ORAL_TABLET | Freq: Every day | ORAL | Status: DC
  Administered 2020-02-25 – 2020-03-03 (×7): 1 via ORAL
  Filled 2020-02-24 (×8): qty 1

## 2020-02-24 MED ORDER — LEVOTHYROXINE SODIUM 112 MCG PO TABS
112.0000 ug | ORAL_TABLET | Freq: Every day | ORAL | Status: DC
  Administered 2020-02-25 – 2020-03-03 (×8): 112 ug via ORAL
  Filled 2020-02-24 (×8): qty 1

## 2020-02-24 MED ORDER — SODIUM CHLORIDE (PF) 0.9 % IJ SOLN
INTRAMUSCULAR | Status: AC
  Filled 2020-02-24: qty 50

## 2020-02-24 MED ORDER — FENTANYL CITRATE (PF) 100 MCG/2ML IJ SOLN
25.0000 ug | INTRAMUSCULAR | Status: DC | PRN
  Administered 2020-02-24 (×3): 50 ug via INTRAVENOUS

## 2020-02-24 MED ORDER — CHLORHEXIDINE GLUCONATE CLOTH 2 % EX PADS
6.0000 | MEDICATED_PAD | Freq: Every day | CUTANEOUS | Status: DC
  Administered 2020-02-26: 6 via TOPICAL

## 2020-02-24 MED ORDER — ISOPROPYL ALCOHOL 70 % SOLN
Status: DC | PRN
  Administered 2020-02-24: 1 via TOPICAL

## 2020-02-24 MED ORDER — POTASSIUM CHLORIDE 10 MEQ/100ML IV SOLN
10.0000 meq | INTRAVENOUS | Status: DC
  Administered 2020-02-24 (×6): 10 meq via INTRAVENOUS
  Filled 2020-02-24 (×6): qty 100

## 2020-02-24 MED ORDER — KETOROLAC TROMETHAMINE 30 MG/ML IJ SOLN
INTRAMUSCULAR | Status: AC
  Filled 2020-02-24: qty 1

## 2020-02-24 MED ORDER — BUPROPION HCL ER (XL) 300 MG PO TB24
300.0000 mg | ORAL_TABLET | Freq: Every day | ORAL | Status: DC
  Administered 2020-02-24 – 2020-03-03 (×8): 300 mg via ORAL
  Filled 2020-02-24 (×9): qty 1

## 2020-02-24 MED ORDER — LACTATED RINGERS IV SOLN: INTRAVENOUS | Status: DC | PRN

## 2020-02-24 MED ORDER — ACETAMINOPHEN 10 MG/ML IV SOLN: 1000.0000 mg | Freq: Once | INTRAVENOUS | Status: DC | PRN

## 2020-02-24 MED ORDER — FENTANYL CITRATE (PF) 100 MCG/2ML IJ SOLN
INTRAMUSCULAR | Status: AC
  Filled 2020-02-24: qty 4

## 2020-02-24 MED ORDER — SUMATRIPTAN SUCCINATE 50 MG PO TABS
100.0000 mg | ORAL_TABLET | ORAL | Status: DC | PRN
  Filled 2020-02-24: qty 2

## 2020-02-24 MED ORDER — ALBUTEROL SULFATE HFA 108 (90 BASE) MCG/ACT IN AERS: 2.0000 | INHALATION_SPRAY | Freq: Four times a day (QID) | RESPIRATORY_TRACT | Status: DC | PRN

## 2020-02-24 MED ORDER — ONDANSETRON HCL 4 MG/2ML IJ SOLN
INTRAMUSCULAR | Status: AC
  Filled 2020-02-24: qty 2

## 2020-02-24 MED ORDER — METOCLOPRAMIDE HCL 5 MG/ML IJ SOLN
5.0000 mg | Freq: Three times a day (TID) | INTRAMUSCULAR | Status: DC | PRN
  Administered 2020-02-26: 10 mg via INTRAVENOUS
  Filled 2020-02-24: qty 2

## 2020-02-24 MED ORDER — ONDANSETRON HCL 4 MG/2ML IJ SOLN
4.0000 mg | Freq: Four times a day (QID) | INTRAMUSCULAR | Status: DC | PRN
  Administered 2020-02-25: 4 mg via INTRAVENOUS
  Filled 2020-02-24: qty 2

## 2020-02-24 MED ORDER — SODIUM CHLORIDE (PF) 0.9 % IJ SOLN
INTRAMUSCULAR | Status: DC | PRN
  Administered 2020-02-24: 30 mL via INTRAVENOUS

## 2020-02-24 MED ORDER — TRANEXAMIC ACID-NACL 1000-0.7 MG/100ML-% IV SOLN
1000.0000 mg | INTRAVENOUS | Status: AC
  Administered 2020-02-24: 1000 mg via INTRAVENOUS
  Filled 2020-02-24: qty 100

## 2020-02-24 MED ORDER — KETOROLAC TROMETHAMINE 30 MG/ML IJ SOLN
INTRAMUSCULAR | Status: DC | PRN
  Administered 2020-02-24: 30 mg via INTRAVENOUS

## 2020-02-24 MED ORDER — METOCLOPRAMIDE HCL 5 MG PO TABS: 5.0000 mg | ORAL_TABLET | Freq: Three times a day (TID) | ORAL | Status: DC | PRN

## 2020-02-24 MED ORDER — PHENYLEPHRINE 40 MCG/ML (10ML) SYRINGE FOR IV PUSH (FOR BLOOD PRESSURE SUPPORT)
PREFILLED_SYRINGE | INTRAVENOUS | Status: DC | PRN
  Administered 2020-02-24 (×3): 80 ug via INTRAVENOUS
  Administered 2020-02-24: 120 ug via INTRAVENOUS
  Administered 2020-02-24 (×2): 80 ug via INTRAVENOUS

## 2020-02-24 MED ORDER — LIDOCAINE 2% (20 MG/ML) 5 ML SYRINGE
INTRAMUSCULAR | Status: DC | PRN
  Administered 2020-02-24: 60 mg via INTRAVENOUS

## 2020-02-24 MED ORDER — WATER FOR IRRIGATION, STERILE IR SOLN
Status: DC | PRN
  Administered 2020-02-24: 2000 mL

## 2020-02-24 MED ORDER — QUETIAPINE FUMARATE 200 MG PO TABS
200.0000 mg | ORAL_TABLET | Freq: Every day | ORAL | Status: DC | PRN
  Administered 2020-02-26 – 2020-02-29 (×3): 200 mg via ORAL
  Filled 2020-02-24 (×2): qty 1
  Filled 2020-02-24: qty 4

## 2020-02-24 SURGICAL SUPPLY — 65 items
ADH SKN CLS APL DERMABOND .7 (GAUZE/BANDAGES/DRESSINGS) ×1
APL PRP STRL LF DISP 70% ISPRP (MISCELLANEOUS) ×1
BAG DECANTER FOR FLEXI CONT (MISCELLANEOUS) IMPLANT
BAG SPEC THK2 15X12 ZIP CLS (MISCELLANEOUS)
BAG ZIPLOCK 12X15 (MISCELLANEOUS) IMPLANT
BLADE SURG SZ10 CARB STEEL (BLADE) IMPLANT
CHLORAPREP W/TINT 26 (MISCELLANEOUS) ×2 IMPLANT
COVER PERINEAL POST (MISCELLANEOUS) ×2 IMPLANT
COVER SURGICAL LIGHT HANDLE (MISCELLANEOUS) ×2 IMPLANT
COVER WAND RF STERILE (DRAPES) IMPLANT
CUP SECTOR GRIPTON 50MM (Cup) ×1 IMPLANT
DECANTER SPIKE VIAL GLASS SM (MISCELLANEOUS) ×2 IMPLANT
DERMABOND ADVANCED (GAUZE/BANDAGES/DRESSINGS) ×1
DERMABOND ADVANCED .7 DNX12 (GAUZE/BANDAGES/DRESSINGS) ×2 IMPLANT
DRAPE IMP U-DRAPE 54X76 (DRAPES) ×2 IMPLANT
DRAPE SHEET LG 3/4 BI-LAMINATE (DRAPES) ×6 IMPLANT
DRAPE STERI IOBAN 125X83 (DRAPES) IMPLANT
DRAPE U-SHAPE 47X51 STRL (DRAPES) ×4 IMPLANT
DRSG AQUACEL AG ADV 3.5X10 (GAUZE/BANDAGES/DRESSINGS) ×2 IMPLANT
ELECT REM PT RETURN 15FT ADLT (MISCELLANEOUS) ×2 IMPLANT
GAUZE SPONGE 4X4 12PLY STRL (GAUZE/BANDAGES/DRESSINGS) ×2 IMPLANT
GLOVE BIO SURGEON STRL SZ8.5 (GLOVE) ×4 IMPLANT
GLOVE BIOGEL M STRL SZ7.5 (GLOVE) ×4 IMPLANT
GLOVE BIOGEL PI IND STRL 8 (GLOVE) ×1 IMPLANT
GLOVE BIOGEL PI IND STRL 8.5 (GLOVE) ×1 IMPLANT
GLOVE BIOGEL PI INDICATOR 8 (GLOVE) ×1
GLOVE BIOGEL PI INDICATOR 8.5 (GLOVE) ×1
GOWN SPEC L3 XXLG W/TWL (GOWN DISPOSABLE) ×2 IMPLANT
GOWN STRL REUS W/ TWL LRG LVL3 (GOWN DISPOSABLE) ×1 IMPLANT
GOWN STRL REUS W/TWL LRG LVL3 (GOWN DISPOSABLE) ×2
HANDPIECE INTERPULSE COAX TIP (DISPOSABLE) ×2
HEAD FEMORAL 32 CERAMIC (Hips) ×1 IMPLANT
HOLDER FOLEY CATH W/STRAP (MISCELLANEOUS) ×2 IMPLANT
HOOD PEEL AWAY FLYTE STAYCOOL (MISCELLANEOUS) ×8 IMPLANT
JET LAVAGE IRRISEPT WOUND (IRRIGATION / IRRIGATOR) ×2
KIT TURNOVER KIT A (KITS) IMPLANT
LAVAGE JET IRRISEPT WOUND (IRRIGATION / IRRIGATOR) ×1 IMPLANT
LINER ACETABULAR 32X50 (Liner) ×1 IMPLANT
MANIFOLD NEPTUNE II (INSTRUMENTS) ×2 IMPLANT
MARKER SKIN DUAL TIP RULER LAB (MISCELLANEOUS) ×2 IMPLANT
NDL SAFETY ECLIPSE 18X1.5 (NEEDLE) ×1 IMPLANT
NDL SPNL 18GX3.5 QUINCKE PK (NEEDLE) ×1 IMPLANT
NEEDLE HYPO 18GX1.5 SHARP (NEEDLE) ×2
NEEDLE SPNL 18GX3.5 QUINCKE PK (NEEDLE) ×2 IMPLANT
PACK ANTERIOR HIP CUSTOM (KITS) ×2 IMPLANT
PENCIL SMOKE EVACUATOR (MISCELLANEOUS) IMPLANT
SAW OSC TIP CART 19.5X105X1.3 (SAW) ×2 IMPLANT
SEALER BIPOLAR AQUA 6.0 (INSTRUMENTS) ×2 IMPLANT
SET HNDPC FAN SPRY TIP SCT (DISPOSABLE) ×1 IMPLANT
STAPLER VISISTAT 35W (STAPLE) ×1 IMPLANT
STEM TRI LOC BPS SZ8 W GRIPTON IMPLANT
SUT ETHIBOND NAB CT1 #1 30IN (SUTURE) ×4 IMPLANT
SUT MNCRL AB 3-0 PS2 18 (SUTURE) ×2 IMPLANT
SUT MNCRL AB 4-0 PS2 18 (SUTURE) ×2 IMPLANT
SUT MON AB 2-0 CT1 36 (SUTURE) ×4 IMPLANT
SUT STRATAFIX PDO 1 14 VIOLET (SUTURE) ×2
SUT STRATFX PDO 1 14 VIOLET (SUTURE) ×1
SUT VIC AB 2-0 CT1 27 (SUTURE) ×2
SUT VIC AB 2-0 CT1 TAPERPNT 27 (SUTURE) ×1 IMPLANT
SUTURE STRATFX PDO 1 14 VIOLET (SUTURE) ×1 IMPLANT
SYR 3ML LL SCALE MARK (SYRINGE) ×2 IMPLANT
TRAY FOLEY MTR SLVR 16FR STAT (SET/KITS/TRAYS/PACK) IMPLANT
TRI LOC BPS SZ8 W GRIPTON ×2 IMPLANT
TUBE SUCTION HIGH CAP CLEAR NV (SUCTIONS) ×2 IMPLANT
WATER STERILE IRR 1000ML POUR (IV SOLUTION) ×2 IMPLANT

## 2020-02-24 NOTE — Discharge Instructions (Signed)
°Dr. Asher Torpey °Joint Replacement Specialist °[facility] °[address] °[place], [place] [address] °[phone] ° ° °TOTAL HIP REPLACEMENT POSTOPERATIVE DIRECTIONS ° ° ° °Hip Rehabilitation, Guidelines Following Surgery  ° °WEIGHT BEARING °Weight bearing as tolerated with assist device (walker, cane, etc) as directed, use it as long as suggested by your surgeon or therapist, typically at least 4-6 weeks. ° °The results of a hip operation are greatly improved after range of motion and muscle strengthening exercises. Follow all safety measures which are given to protect your hip. If any of these exercises cause increased pain or swelling in your joint, decrease the amount until you are comfortable again. Then slowly increase the exercises. Call your caregiver if you have problems or questions.  ° °HOME CARE INSTRUCTIONS  °Most of the following instructions are designed to prevent the dislocation of your new hip.  °Remove items at home which could result in a fall. This includes throw rugs or furniture in walking pathways.  °Continue medications as instructed at time of discharge. °· You may have some home medications which will be placed on hold until you complete the course of blood thinner medication. °· You may start showering once you are discharged home. Do not remove your dressing. °Do not put on socks or shoes without following the instructions of your caregivers.   °Sit on chairs with arms. Use the chair arms to help push yourself up when arising.  °Arrange for the use of a toilet seat elevator so you are not sitting low.  °· Walk with walker as instructed.  °You may resume a sexual relationship in one month or when given the OK by your caregiver.  °Use walker as long as suggested by your caregivers.  °You may put full weight on your legs and walk as much as is comfortable. °Avoid periods of inactivity such as sitting longer than an hour when not asleep. This helps prevent  blood clots.  °You may return to work once you are cleared by your surgeon.  °Do not drive a car for 6 weeks or until released by your surgeon.  °Do not drive while taking narcotics.  °Wear elastic stockings for two weeks following surgery during the day but you may remove then at night.  °Make sure you keep all of your appointments after your operation with all of your doctors and caregivers. You should call the office at the above phone number and make an appointment for approximately two weeks after the date of your surgery. °Please pick up a stool softener and laxative for home use as long as you are requiring pain medications. °· ICE to the affected hip every three hours for 30 minutes at a time and then as needed for pain and swelling. Continue to use ice on the hip for pain and swelling from surgery. You may notice swelling that will progress down to the foot and ankle.  This is normal after surgery.  Elevate the leg when you are not up walking on it.   °It is important for you to complete the blood thinner medication as prescribed by your doctor. °· Continue to use the breathing machine which will help keep your temperature down.  It is common for your temperature to cycle up and down following surgery, especially at night when you are not up moving around and exerting yourself.  The breathing machine keeps your lungs expanded and your temperature down. ° °RANGE OF MOTION AND STRENGTHENING EXERCISES  °These exercises are   designed to help you keep full movement of your hip joint. Follow your caregiver's or physical therapist's instructions. Perform all exercises about fifteen times, three times per day or as directed. Exercise both hips, even if you have had only one joint replacement. These exercises can be done on a training (exercise) mat, on the floor, on a table or on a bed. Use whatever works the best and is most comfortable for you. Use music or television while you are exercising so that the exercises  are a pleasant break in your day. This will make your life better with the exercises acting as a break in routine you can look forward to.  °Lying on your back, slowly slide your foot toward your buttocks, raising your knee up off the floor. Then slowly slide your foot back down until your leg is straight again.  °Lying on your back spread your legs as far apart as you can without causing discomfort.  °Lying on your side, raise your upper leg and foot straight up from the floor as far as is comfortable. Slowly lower the leg and repeat.  °Lying on your back, tighten up the muscle in the front of your thigh (quadriceps muscles). You can do this by keeping your leg straight and trying to raise your heel off the floor. This helps strengthen the largest muscle supporting your knee.  °Lying on your back, tighten up the muscles of your buttocks both with the legs straight and with the knee bent at a comfortable angle while keeping your heel on the floor.  ° °SKILLED REHAB INSTRUCTIONS: °If the patient is transferred to a skilled rehab facility following release from the hospital, a list of the current medications will be sent to the facility for the patient to continue.  When discharged from the skilled rehab facility, please have the facility set up the patient's Home Health Physical Therapy prior to being released. Also, the skilled facility will be responsible for providing the patient with their medications at time of release from the facility to include their pain medication and their blood thinner medication. If the patient is still at the rehab facility at time of the two week follow up appointment, the skilled rehab facility will also need to assist the patient in arranging follow up appointment in our office and any transportation needs. ° °MAKE SURE YOU:  °Understand these instructions.  °Will watch your condition.  °Will get help right away if you are not doing well or get worse. ° °Pick up stool softner and  laxative for home use following surgery while on pain medications. °Do not remove your dressing. °The dressing is waterproof--it is OK to take showers. °Continue to use ice for pain and swelling after surgery. °Do not use any lotions or creams on the incision until instructed by your surgeon. °Total Hip Protocol. ° ° °

## 2020-02-24 NOTE — Consult Note (Signed)
ORTHOPAEDIC CONSULTATION  REQUESTING PHYSICIAN: Allie Bossier, MD  PCP:  [name], MD  Chief Complaint: Left femoral neck fracture  HPI: Victoria Levine is a 69 y.o. female with medical history significant of alcohol abuse ongoing, bipolar disorder, degenerative disc disease, chronic pain syndrome, GERD, asthma, anxiety disorder, hypothyroidism, osteoporosis who had a mechanical fall at home who complains of  Left hip pain and inability to weight bear after a ground level fall at home.  Was brought to the emergency department at [facility], where x-rays revealed a displaced left femoral neck fracture.  She was admitted to the hospitalist service for perioperative medical risk ratification and optimization.  Orthopedic consultation was placed.  I was asked to see the patient by Dr. Lynann Bologna.  She denies other injuries.  Past Medical History:  Diagnosis Date  . Anemia    low iron  . Anxiety   . Asthma   . Bipolar disorder ([place])   . Chronic back pain   . Compression fracture of L2 lumbar vertebra (HCC)   . DDD (degenerative disc disease)   . Depression   . Dyspnea   . GERD (gastroesophageal reflux disease)   . H/O alcohol abuse    28 years sober  . H/O suicide attempt   . Herpes   . History of hiatal hernia   . Hypothyroidism   . Migraines   . Osteoporosis 11/2018   T score of -2.6  . Pneumonia   . S/P adenoidectomy   . S/P tonsillectomy    Past Surgical History:  Procedure Laterality Date  . CESAREAN SECTION    . COLONOSCOPY     as a teenager  . Compression fracture spine    . DILATION AND CURETTAGE OF UTERUS    . EYE SURGERY Bilateral    CATARACTS REMOVED, NO LENS PLACED  . HYSTEROSCOPY    . KYPHOPLASTY N/A 12/19/2016   Procedure: THORACIC 12 KYPHOPLASTY; REQUEST 60 MINS AND FLIP ROOM;  Surgeon: Phylliss Bob, MD;  Location: [place];  Service: Orthopedics;  Laterality: N/A;  THORACIC 12 KYPHOPLASTY; REQUEST 60 MINS AND FLIP ROOM  . KYPHOPLASTY  N/A 12/11/2017   Procedure: LUMBAR 2 KYPHOPLASTY;  Surgeon: Phylliss Bob, MD;  Location: [place];  Service: Orthopedics;  Laterality: N/A;  . LUMBAR FUSION    . TONSILLECTOMY     Social History   Socioeconomic History  . Marital status: Single    Spouse name: Not on file  . Number of children: Not on file  . Years of education: Not on file  . Highest education level: Not on file  Occupational History  . Not on file  Tobacco Use  . Smoking status: Never Smoker  . Smokeless tobacco: Never Used  Vaping Use  . Vaping Use: Never used  Substance and Sexual Activity  . Alcohol use: Yes    Alcohol/week: 0.0 standard drinks  . Drug use: Not Currently    Types: Benzodiazepines, Cocaine  . Sexual activity: Not Currently    Birth control/protection: Post-menopausal    Comment: intercourse age 41, sexual partners less than 5  Other Topics Concern  . Not on file  Social History Narrative  . Not on file   Social Determinants of Health   Financial Resource Strain:   . Difficulty of Paying Living Expenses: Not on file  Food Insecurity:   . Worried About Charity fundraiser in the Last Year: Not on file  . Ran Out of Food in  the Last Year: Not on file  Transportation Needs:   . Lack of Transportation (Medical): Not on file  . Lack of Transportation (Non-Medical): Not on file  Physical Activity:   . Days of Exercise per Week: Not on file  . Minutes of Exercise per Session: Not on file  Stress:   . Feeling of Stress : Not on file  Social Connections:   . Frequency of Communication with Friends and Family: Not on file  . Frequency of Social Gatherings with Friends and Family: Not on file  . Attends Religious Services: Not on file  . Active Member of Clubs or Organizations: Not on file  . Attends Archivist Meetings: Not on file  . Marital Status: Not on file   Family History  Adopted: Yes   Allergies  Allergen Reactions  . Prednisone Other (See Comments)    Cannot  tolerate ORALLY, but can tolerate by shot (interacts with anxiety & bipolarity)  . Lithium Carbonate Other (See Comments)    Patient was taken off of this because of a weight gain of 40 pounds  . Sulfa Antibiotics Other (See Comments)    From childhood; reaction not recalled   Prior to Admission medications   Medication Sig Start Date End Date Taking? Authorizing Provider  albuterol (PROVENTIL HFA;VENTOLIN HFA) 108 (90 Base) MCG/ACT inhaler Inhale 2 puffs into the lungs every 6 (six) hours as needed for wheezing or shortness of breath.   Yes [provider]  buPROPion (WELLBUTRIN XL) 150 MG 24 hr tablet Take 300 mg by mouth daily.  10/01/16  Yes [provider]  calcium citrate-vitamin D (CALCIUM + D) 315-200 MG-UNIT per tablet Take 1 tablet by mouth daily. For bone health Patient taking differently: Take 1 tablet by mouth daily.  10/18/14  Yes Lindell Spar I, NP  cyclobenzaprine (FLEXERIL) 10 MG tablet Take 10 mg by mouth 3 (three) times daily as needed for muscle spasms.    Yes [provider]  denosumab (PROLIA) 60 MG/ML SOLN injection Inject 60 mg into the skin every 6 (six) months. Administer in upper arm, thigh, or abdomen  NEXT IS DUE 03-2017 Patient taking differently: Inject 60 mg into the skin every 6 (six) months.  03/05/17  Yes [name], NP  dicyclomine (BENTYL) 20 MG tablet Take 1 tablet (20 mg total) by mouth every 8 (eight) hours as needed for spasms. 12/31/19  Yes Petrucelli, Samantha R, PA-C  EPINEPHrine 0.3 mg/0.3 mL IJ SOAJ injection Inject 0.3 mg into the skin daily as needed for anaphylaxis.  04/10/19  Yes [provider]  famotidine (PEPCID) 20 MG tablet Take 1 tablet (20 mg total) by mouth at bedtime. 09/03/19 02/23/20 Yes Amin, Jeanella Flattery, MD  ferrous sulfate 325 (65 FE) MG tablet Take 1 tablet (325 mg total) by mouth 2 (two) times daily with a meal. Patient taking differently: Take 325 mg by mouth daily as needed (supplement).  09/03/19  02/23/20 Yes Amin, Jeanella Flattery, MD  FLOVENT HFA 110 MCG/ACT inhaler Inhale 1 puff into the lungs daily.  07/15/19  Yes [provider]  FLUoxetine (PROZAC) 20 MG capsule Take 20 mg by mouth 2 (two) times daily.    Yes [provider]  gabapentin (NEURONTIN) 600 MG tablet Take 600 mg by mouth See admin instructions. Takes 1 tablet 2 times a week as needed for pain 11/05/16  Yes [provider]  levothyroxine (SYNTHROID, LEVOTHROID) 112 MCG tablet Take 112 mcg by mouth daily before  breakfast.  09/04/17  Yes [provider]  methocarbamol (ROBAXIN) 500 MG tablet Take 500 mg by mouth daily as needed for muscle spasms.  03/11/19  Yes [provider]  Multiple Vitamin (MULTIVITAMIN) tablet Take 1 tablet by mouth daily. For low vitamin Patient taking differently: Take 1 tablet by mouth daily.  10/18/14  Yes Lindell Spar I, NP  naproxen sodium (ALEVE) 220 MG tablet Take 220-440 mg by mouth 2 (two) times daily as needed (for pain).   Yes [provider]  NARCAN 4 MG/0.1ML LIQD nasal spray kit Place 1 spray into the nose once.  10/10/19  Yes [provider]  ondansetron (ZOFRAN ODT) 4 MG disintegrating tablet Take 1 tablet (4 mg total) by mouth every 8 (eight) hours as needed for nausea or vomiting. 12/18/19  Yes Joy, Shawn C, PA-C  oxcarbazepine (TRILEPTAL) 600 MG tablet Take 600 mg by mouth daily.    Yes [provider]  OxyCODONE ER (XTAMPZA ER) 13.5 MG C12A Take 13.5 mg by mouth 2 (two) times daily. 11/22/16  Yes [provider]  Oxycodone HCl 10 MG TABS Take 10 mg by mouth daily as needed (for breakthrough pain).  09/16/17  Yes [provider]  polyethylene glycol (MIRALAX / GLYCOLAX) 17 g packet Take 17 g by mouth daily as needed for moderate constipation or severe constipation. 09/03/19  Yes Amin, Jeanella Flattery, MD  QUEtiapine (SEROQUEL) 100 MG tablet Take 200 mg by mouth daily as needed (sleep).  10/12/16  Yes [provider]  rizatriptan (MAXALT-MLT) 10 MG disintegrating tablet Take 10 mg by mouth daily as needed for migraine.  11/08/14  Yes [provider]  sucralfate (CARAFATE) 1 g tablet Take 1 tablet (1 g total) by mouth 4 (four) times daily -  with meals and at bedtime. 12/18/19 02/23/20 Yes Joy, Shawn C, PA-C  valACYclovir (VALTREX) 500 MG tablet Take one tablet twice daily 3-5 days then daily as needed. Patient taking differently: Take 500 mg by mouth daily as needed (breakout).  05/01/17  Yes [name], NP  chlordiazePOXIDE (LIBRIUM) 25 MG capsule 49m PO TID x 1D, then 25-563mPO BID X 1D, then 25-5067mO QD X 1D Patient not taking: Reported on 02/23/2020 05/08/19   Petrucelli, SamGlynda JaegerA-C  pantoprazole (PROTONIX) 40 MG tablet Take 1 tablet (40 mg total) by mouth 2 (two) times daily before a meal. Patient not taking: Reported on 02/23/2020 09/03/19   AmiDamita LackD  promethazine (PHENERGAN) 12.5 MG tablet Take 1 tablet (12.5 mg total) by mouth every 6 (six) hours as needed for nausea or vomiting. Patient not taking: Reported on 02/23/2020 12/31/19   Petrucelli, SamGlynda JaegerA-C   DG Chest 1 View  Result Date: 02/23/2020 CLINICAL DATA:  Pain status post fall EXAM: CHEST  1 VIEW COMPARISON:  04/25/2018 FINDINGS: The heart size is enlarged, increased in size from prior study. There is a large hiatal hernia. The right heart border is indistinct, likely secondary to herniated peritoneal fat abutting the right heart border. The trachea is nearly midline. There is probable atelectasis at the lung bases bilaterally. There is no acute osseous abnormality. No pneumothorax. IMPRESSION: 1. Cardiomegaly. 2. Large hiatal hernia. 3. Bibasilar atelectasis. 4. No acute displaced fracture.  No pneumothorax. 5. Herniated peritoneal fat abutting the right heart border as was visualized on the patient's recent prior CT dated 11/17/2019 Electronically Signed   By: ChrConstance HolsterD.   On:  02/23/2020 18:48  DG Knee 1-2 Views Left  Result Date: 02/23/2020 CLINICAL DATA:  Pain EXAM: LEFT KNEE - 1-2 VIEW COMPARISON:  None. FINDINGS: No evidence of fracture, dislocation, or joint effusion. Minimal tricompartmental degenerative changes are noted, greatest within the medial and patellofemoral compartments. IMPRESSION: Negative. Electronically Signed   By: Constance Holster M.D.   On: 02/23/2020 18:45   DG Tibia/Fibula Left  Result Date: 02/23/2020 CLINICAL DATA:  Pain status post fall EXAM: LEFT TIBIA AND FIBULA - 2 VIEW COMPARISON:  None. FINDINGS: There is no evidence of fracture or other focal bone lesions. Soft tissues are unremarkable. IMPRESSION: Negative. Electronically Signed   By: Constance Holster M.D.   On: 02/23/2020 18:46   DG Ankle 2 Views Left  Result Date: 02/23/2020 CLINICAL DATA:  Pain status post fall EXAM: LEFT ANKLE - 2 VIEW COMPARISON:  None. FINDINGS: There is no evidence of fracture, dislocation, or joint effusion. There is no evidence of arthropathy or other focal bone abnormality. Soft tissues are unremarkable. IMPRESSION: Negative. Electronically Signed   By: Constance Holster M.D.   On: 02/23/2020 18:46   CT Head Wo Contrast  Result Date: 02/23/2020 CLINICAL DATA:  Head trauma, fall EXAM: CT HEAD WITHOUT CONTRAST TECHNIQUE: Contiguous axial images were obtained from the base of the skull through the vertex without intravenous contrast. COMPARISON:  Head CT 09/25/2018 FINDINGS: Brain: No acute territorial infarction, hemorrhage, or intracranial mass. Mild to moderate atrophy. Small chronic cerebellar infarcts. Mild hypodensity in the white matter consistent with chronic small vessel ischemic change. Stable ventricle size. Vascular: No hyperdense vessels. Minimal carotid vascular calcification. Skull: No fracture. Stable ground-glass lesion within the left para median frontal skull, benign given lack of interval change. Sinuses/Orbits: No acute finding. Other:  Small right frontal scalp swelling. IMPRESSION: 1. No CT evidence for acute intracranial abnormality. 2. Atrophy and mild chronic small vessel ischemic changes of the white matter. Electronically Signed   By: Donavan Foil M.D.   On: 02/23/2020 18:06   DG Hip Unilat W or Wo Pelvis 2-3 Views Left  Result Date: 02/23/2020 CLINICAL DATA:  Pain EXAM: DG HIP (WITH OR WITHOUT PELVIS) 2-3V LEFT COMPARISON:  None. FINDINGS: There is an acute, displaced transcervical fracture of the proximal left femur. There is no dislocation. Mild degenerative changes are noted of both hips. IMPRESSION: Acute, displaced transcervical fracture of the proximal left femur. Electronically Signed   By: Constance Holster M.D.   On: 02/23/2020 18:45    Positive ROS: All other systems have been reviewed and were otherwise negative with the exception of those mentioned in the HPI and as above.  Physical Exam: General: Alert, no acute distress Cardiovascular: No pedal edema Respiratory: No cyanosis, no use of accessory musculature GI: No organomegaly, abdomen is soft and non-tender Skin: No lesions in the area of chief complaint Neurologic: Sensation intact distally Psychiatric: Patient is competent for consent with normal mood and affect Lymphatic: No axillary or cervical lymphadenopathy  MUSCULOSKELETAL: Examination left hip reveals no skin wounds or lesions.  She is shortened and externally rotated.  She has pain with attempted logrolling of the hip.  Distally, she is neurovascularly intact.  Assessment: Displaced left femoral neck fracture. Multiple medical problems.  Plan: I discussed the findings with the patient.  She has an unstable, displaced left hip fracture.  We discussed that the most predictable method to control her pain and allow for immediate mobilization is a total hip replacement.  We discussed the risk, benefits, and alternatives.  Please  see statement of risk.  Plan for surgery later today.  Continue  n.p.o.  Hold chemical DVT prophylaxis preoperatively.  All questions solicited and answered.  The risks, benefits, and alternatives were discussed with the patient. There are risks associated with the surgery including, but not limited to, problems with anesthesia (death), infection, instability (giving out of the joint), dislocation, differences in leg length/angulation/rotation, fracture of bones, loosening or failure of implants, hematoma (blood accumulation) which may require surgical drainage, blood clots, pulmonary embolism, nerve injury (foot drop and lateral thigh numbness), and blood vessel injury. The patient understands these risks and elects to proceed.    Bertram Savin, MD [phone]    02/24/2020 1:56 PM

## 2020-02-24 NOTE — Progress Notes (Signed)
Pt became physically and verbally aggressive when I began my initial assessment and vitals. Pt said "I'm going to murder you if you don't leave me alone." Pt bit thermometer, tried to take it from me. Reorientation was attempted. Pt was asked her name and said "I don't know." When asked other questions to assess orientation, pt wouldn't answer or would ask to be left alone. Gave the middle finger to CN. Pt calmed down when left alone. Dia Crawford, MD paged. Verbal orders given for [facility].

## 2020-02-24 NOTE — Progress Notes (Signed)
PROGRESS NOTE    DARIA MCMEEKIN  KZS:[id number] DOB: 1951/02/06 DOA: 02/23/2020 PCP: [name], MD     Brief Narrative:  FATIHA GUZY is a 69 y.o. WF PMHx Suicide attempt, EtOH abuse, Bipolar disorder, Herpes, anxiety DO, degenerative disc disease, GERD, asthma, hypothyroidism, osteoporosis   who had a mechanical fall at home apparently.  Came to the ER complaining of left hip pain.  She fell on her left side.  The pain is 9 out of 10.  Worse on the left side.  Radiating down her leg.  Denied any other site of pain.  Denied any fever or chills denied any nausea vomiting or diarrhea.  Patient was seen and evaluated.  She appears to have left displaced intertrochanteric femur fracture.  Orthopedics consulted and patient being admitted to the hospital for possible repair..  ED Course: Temperature 98.8 blood pressure 204/117, pulse 93 respiratory rate 22 oxygen sats 91% room air.  White count 14.4 hemoglobin 12.8 platelets 362.  Sodium 133 potassium 3.2 chloride 97 CO2 21.  COVID-19 screen is negative.  X-ray of the left ankle chest x-ray knee were all negative x-ray of the left hip shows displaced femoral neck fracture.  Orthopedics consulted and patient being admitted to the hospital for further evaluation and treatment.  Review of Systems: As per HPI otherwise 10 point review of systems negative.    Subjective: Afebrile overnight, attempted to see patient twice still down in surgical suite no charge   Assessment & Plan: Covid vaccination;   Principal Problem:   Displaced intertrochanteric fracture of left femur, initial encounter for closed fracture [facility]) Active Problems:   Hypothyroidism   HYPERCHOLESTEROLEMIA   Bipolar disorder ([place])   Asthma-chronic obstructive pulmonary disease overlap syndrome (HCC)   GERD (gastroesophageal reflux disease)   Osteoporosis   Alcohol abuse   Hypokalemia   displaced intertrochanteric fracture of the left femur: Admit the  patient.  Pain control.  Orthopedic consulted.  Continue PT with OT per orthopedics.  #2 ongoing alcohol abuse: Patient said her last drink was 2 days ago.  She is right at the level DT I will initiate CIWA protocol.  #3 bipolar disorder: Continue home regimen.  #4 hypothyroidism: Continue with levothyroxine  #5 asthma COPD: No exacerbation  #6 GERD: Continue with PPIs  #7 hyperlipidemia: Resume home regimen.  #8 Hypokalemia: Replete  #9 Osteoporosis: Continue treatment  Hypokalemia -Potassium goal> 4 -Potassium IV 60 mEq -Recheck K/Mg @1700     DVT prophylaxis:  Code Status:  Family Communication:  Status is: Inpatient    Dispo: The patient is from: Home              Anticipated d/c is to: Per surgery              Anticipated d/c date is: Per surgery              Patient currently unstable      Consultants:  Orthopedic surgery   procedures/Significant Events:    I have personally reviewed and interpreted all radiology studies and my findings are as above.  VENTILATOR SETTINGS:    Cultures   Antimicrobials:    Devices    LINES / TUBES:      Continuous Infusions: . sodium chloride 100 mL/hr at 02/23/20 2112     Objective: Vitals:   02/23/20 2100 02/23/20 2248 02/24/20 0201 02/24/20 0630  BP: (!) 157/88 (!) 173/95 116/79 (!) 138/92  Pulse: 87  97 91  Resp: 20 19  18 16  Temp:  100.3 F (37.9 C) 99.6 F (37.6 C) 99.8 F (37.7 C)  TempSrc:  Oral Oral Oral  SpO2: 99% 98% (!) 86% 98%  Weight:      Height:        Intake/Output Summary (Last 24 hours) at 02/24/2020 0910 Last data filed at 02/24/2020 0631 Gross per 24 hour  Intake 470.71 ml  Output 70 ml  Net 400.71 ml   Filed Weights   02/23/20 1701  Weight: 76.2 kg    Examination:  Attempted to see patient twice still in surgical suite no charge .     Data Reviewed: Care during the described time interval was provided by me .  I have reviewed this patient's  available data, including medical history, events of note, physical examination, and all test results as part of my evaluation.  CBC: Recent Labs  Lab 02/23/20 1702 02/24/20 0625  WBC 14.4* 13.2*  NEUTROABS 13.0*  --   HGB 12.8 11.3*  HCT 41.5 38.0  MCV 82.3 85.4  PLT 362 299   Basic Metabolic Panel: Recent Labs  Lab 02/23/20 1702 02/24/20 0625  NA 133* 135  K 3.2* 3.0*  CL 97* 103  CO2 21* 22  GLUCOSE 188* 124*  BUN 9 13  CREATININE 0.62 0.80  CALCIUM 9.9 8.8*  MG  --  1.8  PHOS  --  2.9   GFR: Estimated Creatinine Clearance: 69.6 mL/min (by C-G formula based on SCr of 0.8 mg/dL). Liver Function Tests: Recent Labs  Lab 02/24/20 0625  AST 21  ALT 16  ALKPHOS 58  BILITOT 0.7  PROT 6.7  ALBUMIN 3.7   No results for input(s): LIPASE, AMYLASE in the last 168 hours. No results for input(s): AMMONIA in the last 168 hours. Coagulation Profile: Recent Labs  Lab 02/23/20 1702  INR 1.1   Cardiac Enzymes: Recent Labs  Lab 02/23/20 1702  CKTOTAL 373*   BNP (last 3 results) No results for input(s): PROBNP in the last 8760 hours. HbA1C: No results for input(s): HGBA1C in the last 72 hours. CBG: Recent Labs  Lab 02/23/20 1653  GLUCAP 195*   Lipid Profile: No results for input(s): CHOL, HDL, LDLCALC, TRIG, CHOLHDL, LDLDIRECT in the last 72 hours. Thyroid Function Tests: No results for input(s): TSH, T4TOTAL, FREET4, T3FREE, THYROIDAB in the last 72 hours. Anemia Panel: No results for input(s): VITAMINB12, FOLATE, FERRITIN, TIBC, IRON, RETICCTPCT in the last 72 hours. Sepsis Labs: No results for input(s): PROCALCITON, LATICACIDVEN in the last 168 hours.  Recent Results (from the past 240 hour(s))  Resp Panel by RT-PCR (Flu A&B, Covid) Nasopharyngeal Swab     Status: None   Collection Time: 02/23/20  6:48 PM   Specimen: Nasopharyngeal Swab; Nasopharyngeal(NP) swabs in vial transport medium  Result Value Ref Range Status   SARS Coronavirus 2 by RT PCR  NEGATIVE NEGATIVE Final    Comment: (NOTE) SARS-CoV-2 target nucleic acids are NOT DETECTED.  The SARS-CoV-2 RNA is generally detectable in upper respiratory specimens during the acute phase of infection. The lowest concentration of SARS-CoV-2 viral copies this assay can detect is 138 copies/mL. A negative result does not preclude SARS-Cov-2 infection and should not be used as the sole basis for treatment or other patient management decisions. A negative result may occur with  improper specimen collection/handling, submission of specimen other than nasopharyngeal swab, presence of viral mutation(s) within the areas targeted by this assay, and inadequate number of viral copies(<138 copies/mL). A negative result  must be combined with clinical observations, patient history, and epidemiological information. The expected result is Negative.  Fact Sheet for Patients:  EntrepreneurPulse.com.au  Fact Sheet for Healthcare Providers:  IncredibleEmployment.be  This test is no t yet approved or cleared by the [place] FDA and  has been authorized for detection and/or diagnosis of SARS-CoV-2 by FDA under an Emergency Use Authorization (EUA). This EUA will remain  in effect (meaning this test can be used) for the duration of the COVID-19 declaration under Section 564(b)(1) of the Act, 21 U.S.C.section 360bbb-3(b)(1), unless the authorization is terminated  or revoked sooner.       Influenza A by PCR NEGATIVE NEGATIVE Final   Influenza B by PCR NEGATIVE NEGATIVE Final    Comment: (NOTE) The Xpert Xpress SARS-CoV-2/FLU/RSV plus assay is intended as an aid in the diagnosis of influenza from Nasopharyngeal swab specimens and should not be used as a sole basis for treatment. Nasal washings and aspirates are unacceptable for Xpert Xpress SARS-CoV-2/FLU/RSV testing.  Fact Sheet for Patients: EntrepreneurPulse.com.au  Fact Sheet for  Healthcare Providers: IncredibleEmployment.be  This test is not yet approved or cleared by the [place] FDA and has been authorized for detection and/or diagnosis of SARS-CoV-2 by FDA under an Emergency Use Authorization (EUA). This EUA will remain in effect (meaning this test can be used) for the duration of the COVID-19 declaration under Section 564(b)(1) of the Act, 21 U.S.C. section 360bbb-3(b)(1), unless the authorization is terminated or revoked.  Performed at [facility], [place] [address], [place], [place] [address]   Surgical pcr screen     Status: None   Collection Time: 02/23/20 11:49 PM   Specimen: Nasal Mucosa; Nasal Swab  Result Value Ref Range Status   MRSA, PCR NEGATIVE NEGATIVE Final   Staphylococcus aureus NEGATIVE NEGATIVE Final    Comment: (NOTE) The Xpert SA Assay (FDA approved for NASAL specimens in patients 71 years of age and older), is one component of a comprehensive surveillance program. It is not intended to diagnose infection nor to guide or monitor treatment. Performed at [facility], [place] [address], [place], [place] [address]          Radiology Studies: DG Chest 1 View  Result Date: 02/23/2020 CLINICAL DATA:  Pain status post fall EXAM: CHEST  1 VIEW COMPARISON:  04/25/2018 FINDINGS: The heart size is enlarged, increased in size from prior study. There is a large hiatal hernia. The right heart border is indistinct, likely secondary to herniated peritoneal fat abutting the right heart border. The trachea is nearly midline. There is probable atelectasis at the lung bases bilaterally. There is no acute osseous abnormality. No pneumothorax. IMPRESSION: 1. Cardiomegaly. 2. Large hiatal hernia. 3. Bibasilar atelectasis. 4. No acute displaced fracture.  No pneumothorax. 5. Herniated peritoneal fat abutting the right heart border as was visualized on the patient's recent prior CT dated 11/17/2019  Electronically Signed   By: Constance Holster M.D.   On: 02/23/2020 18:48   DG Knee 1-2 Views Left  Result Date: 02/23/2020 CLINICAL DATA:  Pain EXAM: LEFT KNEE - 1-2 VIEW COMPARISON:  None. FINDINGS: No evidence of fracture, dislocation, or joint effusion. Minimal tricompartmental degenerative changes are noted, greatest within the medial and patellofemoral compartments. IMPRESSION: Negative. Electronically Signed   By: Constance Holster M.D.   On: 02/23/2020 18:45   DG Tibia/Fibula Left  Result Date: 02/23/2020 CLINICAL DATA:  Pain status post fall EXAM: LEFT TIBIA AND FIBULA - 2 VIEW COMPARISON:  None. FINDINGS: There is no evidence of fracture or other focal bone lesions. Soft tissues are unremarkable. IMPRESSION: Negative. Electronically Signed   By: Constance Holster M.D.   On: 02/23/2020 18:46   DG Ankle 2 Views Left  Result Date: 02/23/2020 CLINICAL DATA:  Pain status post fall EXAM: LEFT ANKLE - 2 VIEW COMPARISON:  None. FINDINGS: There is no evidence of fracture, dislocation, or joint effusion. There is no evidence of arthropathy or other focal bone abnormality. Soft tissues are unremarkable. IMPRESSION: Negative. Electronically Signed   By: Constance Holster M.D.   On: 02/23/2020 18:46   CT Head Wo Contrast  Result Date: 02/23/2020 CLINICAL DATA:  Head trauma, fall EXAM: CT HEAD WITHOUT CONTRAST TECHNIQUE: Contiguous axial images were obtained from the base of the skull through the vertex without intravenous contrast. COMPARISON:  Head CT 09/25/2018 FINDINGS: Brain: No acute territorial infarction, hemorrhage, or intracranial mass. Mild to moderate atrophy. Small chronic cerebellar infarcts. Mild hypodensity in the white matter consistent with chronic small vessel ischemic change. Stable ventricle size. Vascular: No hyperdense vessels. Minimal carotid vascular calcification. Skull: No fracture. Stable ground-glass lesion within the left para median frontal skull, benign given lack of  interval change. Sinuses/Orbits: No acute finding. Other: Small right frontal scalp swelling. IMPRESSION: 1. No CT evidence for acute intracranial abnormality. 2. Atrophy and mild chronic small vessel ischemic changes of the white matter. Electronically Signed   By: Donavan Foil M.D.   On: 02/23/2020 18:06   DG Hip Unilat W or Wo Pelvis 2-3 Views Left  Result Date: 02/23/2020 CLINICAL DATA:  Pain EXAM: DG HIP (WITH OR WITHOUT PELVIS) 2-3V LEFT COMPARISON:  None. FINDINGS: There is an acute, displaced transcervical fracture of the proximal left femur. There is no dislocation. Mild degenerative changes are noted of both hips. IMPRESSION: Acute, displaced transcervical fracture of the proximal left femur. Electronically Signed   By: Constance Holster M.D.   On: 02/23/2020 18:45        Scheduled Meds: . enoxaparin (LOVENOX) injection  40 mg Subcutaneous Q24H  . folic acid  1 mg Oral Daily  . multivitamin with minerals  1 tablet Oral Daily  . thiamine  100 mg Oral Daily   Or  . thiamine  100 mg Intravenous Daily   Continuous Infusions: . sodium chloride 100 mL/hr at 02/23/20 2112     LOS: 1 day    Time spent:0 min    Aileene Lanum, Geraldo Docker, MD [facility] Pager [phone]  If 7PM-7AM, please contact night-coverage [email or web address] Password [email or web address] 02/24/2020, 9:10 AM

## 2020-02-24 NOTE — Anesthesia Procedure Notes (Signed)
Procedure Name: Intubation Date/Time: 02/24/2020 2:43 PM Performed by: Sharlette Dense, CRNA Patient Re-evaluated:Patient Re-evaluated prior to induction Oxygen Delivery Method: Circle system utilized Preoxygenation: Pre-oxygenation with 100% oxygen Induction Type: IV induction Ventilation: Mask ventilation without difficulty and Oral airway inserted - appropriate to patient size Laryngoscope Size: [name] and 2 Grade View: Grade I Tube type: Oral Tube size: 7.0 mm Number of attempts: 1 Airway Equipment and Method: Stylet Placement Confirmation: ETT inserted through vocal cords under direct vision,  positive ETCO2 and breath sounds checked- equal and bilateral Secured at: 21 cm Tube secured with: Tape Dental Injury: Teeth and Oropharynx as per pre-operative assessment

## 2020-02-24 NOTE — Op Note (Signed)
OPERATIVE REPORT  SURGEON: Rod Can, MD   ASSISTANT: Cherlynn June, PA-C  PREOPERATIVE DIAGNOSIS: Displaced Left femoral neck fracture.   POSTOPERATIVE DIAGNOSIS: Displaced Left femoral neck fracture.   PROCEDURE: Left total hip arthroplasty, anterior approach.   IMPLANTS: DePuy Tri Lock stem, size 8, hi offset. DePuy Pinnacle Cup, size 50 mm. DePuy Altrx liner, size 32 by 50 mm, neutral. DePuy Biolox ceramic head ball, size 32 + 1 mm.  ANESTHESIA:  General  ANTIBIOTICS: 2g ancef.  ESTIMATED BLOOD LOSS:-150 mL    DRAINS: None.  COMPLICATIONS: None   CONDITION: PACU - hemodynamically stable.   BRIEF CLINICAL NOTE: Victoria Levine is a 69 y.o. female with a displaced Left femoral neck fracture. The patient was admitted to the hospitalist service and underwent perioperative risk stratification and medical optimization. The risks, benefits, and alternatives to total hip arthroplasty were explained, and the patient elected to proceed.  PROCEDURE IN DETAIL: The patient was taken to the operating room and general anesthesia was induced on the hospital bed.  The patient was then positioned on the Hana table.  All bony prominences were well padded.  The hip was prepped and draped in the normal sterile surgical fashion.  A time-out was called verifying side and site of surgery. Antibiotics were given within 60 minutes of beginning the procedure.  The direct anterior approach to the hip was performed through the Hueter interval.  Lateral femoral circumflex vessels were treated with the Auqumantys. The anterior capsule was exposed and an inverted T capsulotomy was made.  Fracture hematoma was encountered and evacuated. The patient was found to have a comminuted Left subcapital femoral neck fracture.  I freshened the femoral neck cut with a saw.  I removed the femoral neck fragment.  A corkscrew was placed into the head and the head was removed.  This was passed to the back  table and was measured.   Acetabular exposure was achieved, and the pulvinar and labrum were excised. Sequential reaming of the acetabulum was then performed up to a size 49 mm reamer. A 50 mm cup was then opened and impacted into place at approximately 40 degrees of abduction and 20 degrees of anteversion. The final polyethylene liner was impacted into place.    I then gained femoral exposure taking care to protect the abductors and greater trochanter.  This was performed using standard external rotation, extension, and adduction.  The capsule was peeled off the inner aspect of the greater trochanter, taking care to preserve the short external rotators. A cookie cutter was used to enter the femoral canal, and then the femoral canal finder was used to confirm location.  I then sequentially broached up to a size 8.  Calcar planer was used on the femoral neck remnant.  I paced a hi neck and a trial head ball. The hip was reduced.  Leg lengths were checked fluoroscopically.  The hip was dislocated and trial components were removed.  I placed the real stem followed by the real spacer and head ball.  The hip was reduced.  Fluoroscopy was used to confirm component position and leg lengths.  At 90 degrees of external rotation and extension, the hip was stable to an anterior directed force.   The wound was copiously irrigated with Irrisept solution and normal saline using pule lavage.  Marcaine solution was injected into the periarticular soft tissue.  The wound was closed in layers using #1 Vicryl and V-Loc for the fascia, 2-0 Vicryl for the subcutaneous fat,  2-0 Monocryl for the deep dermal layer, 3-0 running Monocryl subcuticular stitch and glue for the skin.  Once the glue was fully dried, an Aquacell Ag dressing was applied.  The patient was then awakened from anesthesia and transported to the recovery room in stable condition.  Sponge, needle, and instrument counts were correct at the end of the case x2.  The  patient tolerated the procedure well and there were no known complications.  Please note that a surgical assistant was a medical necessity for this procedure to perform it in a safe and expeditious manner. Assistant was necessary to provide appropriate retraction of vital neurovascular structures, to prevent femoral fracture, and to allow for anatomic placement of the prosthesis.

## 2020-02-24 NOTE — Anesthesia Preprocedure Evaluation (Addendum)
Anesthesia Evaluation  Patient identified by MRN, date of birth, ID band  Reviewed: Allergy & Precautions, NPO status , Patient's Chart, lab work & pertinent test results  Airway Mallampati: II  TM Distance: >3 FB Neck ROM: Full    Dental no notable dental hx.    Pulmonary asthma , COPD,  COPD inhaler,    Pulmonary exam normal breath sounds clear to auscultation       Cardiovascular negative cardio ROS Normal cardiovascular exam Rhythm:Regular Rate:Normal  ECG: SR, rate 86   Neuro/Psych  Headaches, PSYCHIATRIC DISORDERS Anxiety Depression Bipolar Disorder    GI/Hepatic hiatal hernia, GERD  Medicated and Controlled,(+)     substance abuse  alcohol use,   Endo/Other  Hypothyroidism   Renal/GU negative Renal ROS     Musculoskeletal  (+) Arthritis , narcotic dependentLUMBAR FUSION Compression fracture of L2 lumbar vertebra   Abdominal   Peds  Hematology negative hematology ROS (+)   Anesthesia Other Findings Left femoral neck fracture  Reproductive/Obstetrics                            Anesthesia Physical Anesthesia Plan  ASA: III  Anesthesia Plan: General   Post-op Pain Management:    Induction: Intravenous  PONV Risk Score and Plan: 3 and Ondansetron, Dexamethasone, Propofol infusion and Treatment may vary due to age or medical condition  Airway Management Planned: Oral ETT  Additional Equipment:   Intra-op Plan:   Post-operative Plan: Extubation in OR  Informed Consent: I have reviewed the patients History and Physical, chart, labs and discussed the procedure including the risks, benefits and alternatives for the proposed anesthesia with the patient or authorized representative who has indicated his/her understanding and acceptance.     Dental advisory given  Plan Discussed with: CRNA  Anesthesia Plan Comments:         Anesthesia Quick Evaluation

## 2020-02-24 NOTE — Anesthesia Postprocedure Evaluation (Signed)
Anesthesia Post Note  Patient: Victoria Levine  Procedure(s) Performed: TOTAL HIP ARTHROPLASTY ANTERIOR APPROACH (Left Hip)     Patient location during evaluation: PACU Anesthesia Type: General Level of consciousness: awake Pain management: pain level controlled Vital Signs Assessment: post-procedure vital signs reviewed and stable Respiratory status: spontaneous breathing, nonlabored ventilation, respiratory function stable and patient connected to nasal cannula oxygen Cardiovascular status: blood pressure returned to baseline and stable Postop Assessment: no apparent nausea or vomiting Anesthetic complications: no   No complications documented.  Last Vitals:  Vitals:   02/24/20 1730 02/24/20 1756  BP:  (!) 139/107  Pulse: 68 71  Resp: 11 18  Temp: (!) 36.1 C (!) 36.4 C  SpO2: 100% 96%    Last Pain:  Vitals:   02/24/20 1756  TempSrc: Oral  PainSc:                  Armando Lauman P Fabrice Dyal

## 2020-02-24 NOTE — Transfer of Care (Signed)
Immediate Anesthesia Transfer of Care Note  Patient: Victoria Levine  Procedure(s) Performed: TOTAL HIP ARTHROPLASTY ANTERIOR APPROACH (Left Hip)  Patient Location: PACU  Anesthesia Type:General  Level of Consciousness: sedated, patient cooperative and responds to stimulation  Airway & Oxygen Therapy: Patient Spontanous Breathing and Patient connected to face mask oxygen  Post-op Assessment: Report given to RN and Post -op Vital signs reviewed and stable  Post vital signs: Reviewed and stable  Last Vitals:  Vitals Value Taken Time  BP 128/75 02/24/20 1635  Temp    Pulse 90 02/24/20 1637  Resp 17 02/24/20 1637  SpO2 100 % 02/24/20 1637  Vitals shown include unvalidated device data.  Last Pain:  Vitals:   02/24/20 1402  TempSrc: Oral  PainSc:       Patients Stated Pain Goal: 2 (08/67/61 9509)  Complications: No complications documented.

## 2020-02-25 ENCOUNTER — Encounter (HOSPITAL_COMMUNITY): Payer: Self-pay | Admitting: Orthopedic Surgery

## 2020-02-25 DIAGNOSIS — M8000XA Age-related osteoporosis with current pathological fracture, unspecified site, initial encounter for fracture: Secondary | ICD-10-CM

## 2020-02-25 DIAGNOSIS — M25552 Pain in left hip: Secondary | ICD-10-CM

## 2020-02-25 DIAGNOSIS — S72002A Fracture of unspecified part of neck of left femur, initial encounter for closed fracture: Secondary | ICD-10-CM

## 2020-02-25 DIAGNOSIS — F310 Bipolar disorder, current episode hypomanic: Secondary | ICD-10-CM

## 2020-02-25 DIAGNOSIS — J449 Chronic obstructive pulmonary disease, unspecified: Secondary | ICD-10-CM

## 2020-02-25 DIAGNOSIS — F1010 Alcohol abuse, uncomplicated: Secondary | ICD-10-CM

## 2020-02-25 DIAGNOSIS — E038 Other specified hypothyroidism: Secondary | ICD-10-CM

## 2020-02-25 DIAGNOSIS — E876 Hypokalemia: Secondary | ICD-10-CM

## 2020-02-25 DIAGNOSIS — E7800 Pure hypercholesterolemia, unspecified: Secondary | ICD-10-CM

## 2020-02-25 DIAGNOSIS — W19XXXA Unspecified fall, initial encounter: Secondary | ICD-10-CM

## 2020-02-25 LAB — CBC WITH DIFFERENTIAL/PLATELET
Abs Immature Granulocytes: 0.06 10*3/uL (ref 0.00–0.07)
Basophils Absolute: 0 10*3/uL (ref 0.0–0.1)
Basophils Relative: 0 %
Eosinophils Absolute: 0.1 10*3/uL (ref 0.0–0.5)
Eosinophils Relative: 1 %
HCT: 34.8 % — ABNORMAL LOW (ref 36.0–46.0)
Hemoglobin: 9.8 g/dL — ABNORMAL LOW (ref 12.0–15.0)
Immature Granulocytes: 1 %
Lymphocytes Relative: 11 %
Lymphs Abs: 1.3 10*3/uL (ref 0.7–4.0)
MCH: 25.1 pg — ABNORMAL LOW (ref 26.0–34.0)
MCHC: 28.2 g/dL — ABNORMAL LOW (ref 30.0–36.0)
MCV: 89 fL (ref 80.0–100.0)
Monocytes Absolute: 1.4 10*3/uL — ABNORMAL HIGH (ref 0.1–1.0)
Monocytes Relative: 13 %
Neutro Abs: 8.5 10*3/uL — ABNORMAL HIGH (ref 1.7–7.7)
Neutrophils Relative %: 74 %
Platelets: 237 10*3/uL (ref 150–400)
RBC: 3.91 MIL/uL (ref 3.87–5.11)
RDW: 15.3 % (ref 11.5–15.5)
WBC: 11.4 10*3/uL — ABNORMAL HIGH (ref 4.0–10.5)
nRBC: 0 % (ref 0.0–0.2)

## 2020-02-25 LAB — COMPREHENSIVE METABOLIC PANEL
ALT: 15 U/L (ref 0–44)
AST: 26 U/L (ref 15–41)
Albumin: 3.1 g/dL — ABNORMAL LOW (ref 3.5–5.0)
Alkaline Phosphatase: 52 U/L (ref 38–126)
Anion gap: 7 (ref 5–15)
BUN: 17 mg/dL (ref 8–23)
CO2: 22 mmol/L (ref 22–32)
Calcium: 8.4 mg/dL — ABNORMAL LOW (ref 8.9–10.3)
Chloride: 105 mmol/L (ref 98–111)
Creatinine, Ser: 0.85 mg/dL (ref 0.44–1.00)
GFR, Estimated: >60 mL/min (ref >60)
Glucose, Bld: 111 mg/dL — ABNORMAL HIGH (ref 70–99)
Potassium: 4.1 mmol/L (ref 3.5–5.1)
Sodium: 134 mmol/L — ABNORMAL LOW (ref 135–145)
Total Bilirubin: 0.5 mg/dL (ref 0.3–1.2)
Total Protein: 5.8 g/dL — ABNORMAL LOW (ref 6.5–8.1)

## 2020-02-25 LAB — MAGNESIUM: Magnesium: 2 mg/dL (ref 1.7–2.4)

## 2020-02-25 NOTE — Progress Notes (Signed)
    Subjective:  Patient reports pain as mild to moderate.  Denies N/V/CP/SOB. Patient is confused this AM  Objective:   VITALS:   Vitals:   02/24/20 2350 02/25/20 0201 02/25/20 0541 02/25/20 0807  BP: 125/78 133/78 138/77   Pulse: 92 73 75   Resp:  18 16   Temp:  99.2 F (37.3 C) 99.3 F (37.4 C)   TempSrc:  Oral Oral   SpO2:  100% 92% 92%  Weight:      Height:        NAD ABD soft Neurovascular intact Sensation intact distally Intact pulses distally Dorsiflexion/Plantar flexion intact Incision: dressing C/D/I   Lab Results  Component Value Date   WBC 11.4 (H) 02/25/2020   HGB 9.8 (L) 02/25/2020   HCT 34.8 (L) 02/25/2020   MCV 89.0 02/25/2020   PLT 237 02/25/2020   BMET    Component Value Date/Time   NA 134 (L) 02/25/2020 0333   K 4.1 02/25/2020 0333   CL 105 02/25/2020 0333   CO2 22 02/25/2020 0333   GLUCOSE 111 (H) 02/25/2020 0333   BUN 17 02/25/2020 0333   CREATININE 0.85 02/25/2020 0333   CREATININE 0.67 12/05/2012 1401   CALCIUM 8.4 (L) 02/25/2020 0333   CALCIUM 9.4 10/31/2011 1152   GFRNONAA >60 02/25/2020 0333   GFRAA >60 12/18/2019 1203     Assessment/Plan: 1 Day Post-Op   Principal Problem:   Displaced intertrochanteric fracture of left femur, initial encounter for closed fracture (HCC) Active Problems:   Hypothyroidism   HYPERCHOLESTEROLEMIA   Bipolar disorder (HCC)   Asthma-chronic obstructive pulmonary disease overlap syndrome (HCC)   GERD (gastroesophageal reflux disease)   Osteoporosis   Alcohol abuse   Hypokalemia   WBAT with walker DVT ppx: Aspirin, SCDs, TEDS PO pain control PT/OT ABLA: Treat per hospitalist Dispo: D/C per hospitalist     Dorothyann Peng 02/25/2020, 10:43 AM [facility] is now [address], [place], [place] [address] Phone: [phone] [email or web address] Facebook  [employer]

## 2020-02-25 NOTE — Progress Notes (Signed)
PROGRESS NOTE    QUETZAL MEANY  MWU:[id number] DOB: Jan 24, 1951 DOA: 02/23/2020 PCP: [name], MD     Brief Narrative:  LURLIE WIGEN is a 69 y.o. WF PMHx Suicide attempt, EtOH abuse, Bipolar disorder, Herpes, anxiety DO, degenerative disc disease, GERD, asthma, hypothyroidism, osteoporosis   who had a mechanical fall at home apparently.  Came to the ER complaining of left hip pain.  She fell on her left side.  The pain is 9 out of 10.  Worse on the left side.  Radiating down her leg.  Denied any other site of pain.  Denied any fever or chills denied any nausea vomiting or diarrhea.  Patient was seen and evaluated.  She appears to have left displaced intertrochanteric femur fracture.  Orthopedics consulted and patient being admitted to the hospital for possible repair..  ED Course: Temperature 98.8 blood pressure 204/117, pulse 93 respiratory rate 22 oxygen sats 91% room air.  White count 14.4 hemoglobin 12.8 platelets 362.  Sodium 133 potassium 3.2 chloride 97 CO2 21.  COVID-19 screen is negative.  X-ray of the left ankle chest x-ray knee were all negative x-ray of the left hip shows displaced femoral neck fracture.  Orthopedics consulted and patient being admitted to the hospital for further evaluation and treatment.  Review of Systems: As per HPI otherwise 10 point review of systems negative.    Subjective: 12/9 afebrile overnight A/O x4, but still some confusion patient believes she was walking around last night.  Does not recall threatening to kill the staff.  Very calm today and cooperative   Assessment & Plan: Covid vaccination;   Principal Problem:   Displaced intertrochanteric fracture of left femur, initial encounter for closed fracture ([place]) Active Problems:   Hypothyroidism   HYPERCHOLESTEROLEMIA   Bipolar disorder ([place])   Asthma-chronic obstructive pulmonary disease overlap syndrome (HCC)   GERD (gastroesophageal reflux disease)   Osteoporosis    Alcohol abuse   Hypokalemia   Displaced intertrochanteric fracture of the LEFT femur:  -S/p Left total hip arthroplasty  Osteoporosis -Continue home meds  EtOH -Last drink per patient 2 days ago -CIWA protocol  Bipolar DO -Continue home meds  Hypothyroidism -Continue home meds  Asthma/COPD -Continue home meds  HLD -Lipid panel pending  Hypokalemia -Potassium goal> 4     DVT prophylaxis: Lovenox Code Status: Full Family Communication:     Dispo: The patient is from: Home              Anticipated d/c is to: SNF?              Anticipated d/c date is: Per surgery              Patient currently unstable      Consultants:  Orthopedic surgery   procedures/Significant Events:  12/8 Left total hip arthroplasty, anterior approach    I have personally reviewed and interpreted all radiology studies and my findings are as above.  VENTILATOR SETTINGS:    Cultures   Antimicrobials:    Devices    LINES / TUBES:      Continuous Infusions: . sodium chloride 100 mL/hr at 02/25/20 0526     Objective: Vitals:   02/25/20 0201 02/25/20 0541 02/25/20 0807 02/25/20 1354  BP: 133/78 138/77  118/68  Pulse: 73 75  82  Resp: 18 16  20   Temp: 99.2 F (37.3 C) 99.3 F (37.4 C)  99.9 F (37.7 C)  TempSrc: Oral Oral  Oral  SpO2: 100% 92%  92% 92%  Weight:      Height:        Intake/Output Summary (Last 24 hours) at 02/25/2020 1601 Last data filed at 02/25/2020 1055 Gross per 24 hour  Intake 1840 ml  Output 540 ml  Net 1300 ml   Filed Weights   02/23/20 1701  Weight: 76.2 kg   Physical Exam:  General: A/O x4, but still some confusion, no acute respiratory distress Eyes: negative scleral hemorrhage, negative anisocoria, negative icterus ENT: Negative Runny nose, negative gingival bleeding, Neck:  Negative scars, masses, torticollis, lymphadenopathy, JVD Lungs: Clear to auscultation bilaterally without wheezes or crackles Cardiovascular:  Regular rate and rhythm without murmur gallop or rub normal S1 and S2 Abdomen: negative abdominal pain, nondistended, positive soft, bowel sounds, no rebound, no ascites, no appreciable mass Extremities: left leg appropriately tender for surgery performed on 12/8 Skin: Negative rashes, lesions, ulcers Psychiatric:  Negative depression, negative anxiety, negative fatigue, negative mania  Central nervous system:  Cranial nerves II through XII intact, tongue/uvula midline, all extremities muscle strength 5/5, sensation intact throughout,  negative dysarthria, negative expressive aphasia, negative receptive aphasia. .     Data Reviewed: Care during the described time interval was provided by me .  I have reviewed this patient's available data, including medical history, events of note, physical examination, and all test results as part of my evaluation.  CBC: Recent Labs  Lab 02/23/20 1702 02/24/20 0625 02/25/20 0333  WBC 14.4* 13.2* 11.4*  NEUTROABS 13.0*  --  8.5*  HGB 12.8 11.3* 9.8*  HCT 41.5 38.0 34.8*  MCV 82.3 85.4 89.0  PLT 362 269 825   Basic Metabolic Panel: Recent Labs  Lab 02/23/20 1702 02/24/20 0625 02/25/20 0333  NA 133* 135 134*  K 3.2* 3.1*  3.0* 4.1  CL 97* 103 105  CO2 21* 22 22  GLUCOSE 188* 124* 111*  BUN 9 13 17   CREATININE 0.62 0.80 0.85  CALCIUM 9.9 8.8* 8.4*  MG  --  1.9  1.8 2.0  PHOS  --  2.9  --    GFR: Estimated Creatinine Clearance: 65.5 mL/min (by C-G formula based on SCr of 0.85 mg/dL). Liver Function Tests: Recent Labs  Lab 02/24/20 0625 02/25/20 0333  AST 21 26  ALT 16 15  ALKPHOS 58 52  BILITOT 0.7 0.5  PROT 6.7 5.8*  ALBUMIN 3.7 3.1*   No results for input(s): LIPASE, AMYLASE in the last 168 hours. No results for input(s): AMMONIA in the last 168 hours. Coagulation Profile: Recent Labs  Lab 02/23/20 1702  INR 1.1   Cardiac Enzymes: Recent Labs  Lab 02/23/20 1702  CKTOTAL 373*   BNP (last 3 results) No results for  input(s): PROBNP in the last 8760 hours. HbA1C: No results for input(s): HGBA1C in the last 72 hours. CBG: Recent Labs  Lab 02/23/20 1653  GLUCAP 195*   Lipid Profile: No results for input(s): CHOL, HDL, LDLCALC, TRIG, CHOLHDL, LDLDIRECT in the last 72 hours. Thyroid Function Tests: No results for input(s): TSH, T4TOTAL, FREET4, T3FREE, THYROIDAB in the last 72 hours. Anemia Panel: No results for input(s): VITAMINB12, FOLATE, FERRITIN, TIBC, IRON, RETICCTPCT in the last 72 hours. Sepsis Labs: No results for input(s): PROCALCITON, LATICACIDVEN in the last 168 hours.  Recent Results (from the past 240 hour(s))  Resp Panel by RT-PCR (Flu A&B, Covid) Nasopharyngeal Swab     Status: None   Collection Time: 02/23/20  6:48 PM   Specimen: Nasopharyngeal Swab; Nasopharyngeal(NP) swabs in vial transport  medium  Result Value Ref Range Status   SARS Coronavirus 2 by RT PCR NEGATIVE NEGATIVE Final    Comment: (NOTE) SARS-CoV-2 target nucleic acids are NOT DETECTED.  The SARS-CoV-2 RNA is generally detectable in upper respiratory specimens during the acute phase of infection. The lowest concentration of SARS-CoV-2 viral copies this assay can detect is 138 copies/mL. A negative result does not preclude SARS-Cov-2 infection and should not be used as the sole basis for treatment or other patient management decisions. A negative result may occur with  improper specimen collection/handling, submission of specimen other than nasopharyngeal swab, presence of viral mutation(s) within the areas targeted by this assay, and inadequate number of viral copies(<138 copies/mL). A negative result must be combined with clinical observations, patient history, and epidemiological information. The expected result is Negative.  Fact Sheet for Patients:  EntrepreneurPulse.com.au  Fact Sheet for Healthcare Providers:  IncredibleEmployment.be  This test is no t yet approved  or cleared by the [place] FDA and  has been authorized for detection and/or diagnosis of SARS-CoV-2 by FDA under an Emergency Use Authorization (EUA). This EUA will remain  in effect (meaning this test can be used) for the duration of the COVID-19 declaration under Section 564(b)(1) of the Act, 21 U.S.C.section 360bbb-3(b)(1), unless the authorization is terminated  or revoked sooner.       Influenza A by PCR NEGATIVE NEGATIVE Final   Influenza B by PCR NEGATIVE NEGATIVE Final    Comment: (NOTE) The Xpert Xpress SARS-CoV-2/FLU/RSV plus assay is intended as an aid in the diagnosis of influenza from Nasopharyngeal swab specimens and should not be used as a sole basis for treatment. Nasal washings and aspirates are unacceptable for Xpert Xpress SARS-CoV-2/FLU/RSV testing.  Fact Sheet for Patients: EntrepreneurPulse.com.au  Fact Sheet for Healthcare Providers: IncredibleEmployment.be  This test is not yet approved or cleared by the [place] FDA and has been authorized for detection and/or diagnosis of SARS-CoV-2 by FDA under an Emergency Use Authorization (EUA). This EUA will remain in effect (meaning this test can be used) for the duration of the COVID-19 declaration under Section 564(b)(1) of the Act, 21 U.S.C. section 360bbb-3(b)(1), unless the authorization is terminated or revoked.  Performed at [facility], [place] [address] [place], [place] [address]   Surgical pcr screen     Status: None   Collection Time: 02/23/20 11:49 PM   Specimen: Nasal Mucosa; Nasal Swab  Result Value Ref Range Status   MRSA, PCR NEGATIVE NEGATIVE Final   Staphylococcus aureus NEGATIVE NEGATIVE Final    Comment: (NOTE) The Xpert SA Assay (FDA approved for NASAL specimens in patients 64 years of age and older), is one component of a comprehensive surveillance program. It is not intended to diagnose infection nor to guide or monitor  treatment. Performed at [facility], [place] [address] [place], [place] [address]          Radiology Studies: DG Chest 1 View  Result Date: 02/23/2020 CLINICAL DATA:  Pain status post fall EXAM: CHEST  1 VIEW COMPARISON:  04/25/2018 FINDINGS: The heart size is enlarged, increased in size from prior study. There is a large hiatal hernia. The right heart border is indistinct, likely secondary to herniated peritoneal fat abutting the right heart border. The trachea is nearly midline. There is probable atelectasis at the lung bases bilaterally. There is no acute osseous abnormality. No pneumothorax. IMPRESSION: 1. Cardiomegaly. 2. Large hiatal hernia. 3. Bibasilar atelectasis. 4. No acute displaced fracture.  No pneumothorax. 5. Herniated peritoneal fat abutting the right heart border as was visualized on the patient's recent prior CT dated 11/17/2019 Electronically Signed   By: Constance Holster M.D.   On: 02/23/2020 18:48   DG Knee 1-2 Views Left  Result Date: 02/23/2020 CLINICAL DATA:  Pain EXAM: LEFT KNEE - 1-2 VIEW COMPARISON:  None. FINDINGS: No evidence of fracture, dislocation, or joint effusion. Minimal tricompartmental degenerative changes are noted, greatest within the medial and patellofemoral compartments. IMPRESSION: Negative. Electronically Signed   By: Constance Holster M.D.   On: 02/23/2020 18:45   DG Tibia/Fibula Left  Result Date: 02/23/2020 CLINICAL DATA:  Pain status post fall EXAM: LEFT TIBIA AND FIBULA - 2 VIEW COMPARISON:  None. FINDINGS: There is no evidence of fracture or other focal bone lesions. Soft tissues are unremarkable. IMPRESSION: Negative. Electronically Signed   By: Constance Holster M.D.   On: 02/23/2020 18:46   DG Ankle 2 Views Left  Result Date: 02/23/2020 CLINICAL DATA:  Pain status post fall EXAM: LEFT ANKLE - 2 VIEW COMPARISON:  None. FINDINGS: There is no evidence of fracture, dislocation, or joint effusion. There is no evidence of  arthropathy or other focal bone abnormality. Soft tissues are unremarkable. IMPRESSION: Negative. Electronically Signed   By: Constance Holster M.D.   On: 02/23/2020 18:46   CT Head Wo Contrast  Result Date: 02/23/2020 CLINICAL DATA:  Head trauma, fall EXAM: CT HEAD WITHOUT CONTRAST TECHNIQUE: Contiguous axial images were obtained from the base of the skull through the vertex without intravenous contrast. COMPARISON:  Head CT 09/25/2018 FINDINGS: Brain: No acute territorial infarction, hemorrhage, or intracranial mass. Mild to moderate atrophy. Small chronic cerebellar infarcts. Mild hypodensity in the white matter consistent with chronic small vessel ischemic change. Stable ventricle size. Vascular: No hyperdense vessels. Minimal carotid vascular calcification. Skull: No fracture. Stable ground-glass lesion within the left para median frontal skull, benign given lack of interval change. Sinuses/Orbits: No acute finding. Other: Small right frontal scalp swelling. IMPRESSION: 1. No CT evidence for acute intracranial abnormality. 2. Atrophy and mild chronic small vessel ischemic changes of the white matter. Electronically Signed   By: Donavan Foil M.D.   On: 02/23/2020 18:06   Pelvis Portable  Result Date: 02/24/2020 CLINICAL DATA:  69 year old female status post left hip arthroplasty today following posttraumatic fracture. EXAM: PORTABLE PELVIS 1-2 VIEWS COMPARISON:  Intraoperative images 160 1 hours today. FINDINGS: AP view at 1710 hours. Left bipolar hip arthroplasty with intact hardware and normal AP alignment. No unexpected osseous changes. Regional soft tissue gas, overlying skin staples. IMPRESSION: Left bipolar hip arthroplasty with no adverse features. Electronically Signed   By: Genevie Ann M.D.   On: 02/24/2020 19:33   DG C-Arm 1-60 Min-No Report  Result Date: 02/24/2020 Fluoroscopy was utilized by the requesting physician.  No radiographic interpretation.   DG HIP OPERATIVE UNILAT W OR W/O  PELVIS LEFT  Result Date: 02/24/2020 CLINICAL DATA:  Left hip arthroplasty EXAM: OPERATIVE left HIP (WITH PELVIS IF PERFORMED) 2 VIEWS TECHNIQUE: Fluoroscopic spot image(s) were submitted for interpretation post-operatively. COMPARISON:  02/23/2020 FINDINGS: Two low resolution intraoperative spot views of the left hip. Total fluoroscopy time was 10 seconds. The images demonstrate a left hip replacement with intact hardware and normal alignment IMPRESSION: Intraoperative fluoroscopic assistance provided during left hip replacement. Electronically Signed   By: Donavan Foil M.D.   On: 02/24/2020 16:26   DG Hip Unilat W or Wo Pelvis 2-3 Views Left  Result Date: 02/23/2020 CLINICAL  DATA:  Pain EXAM: DG HIP (WITH OR WITHOUT PELVIS) 2-3V LEFT COMPARISON:  None. FINDINGS: There is an acute, displaced transcervical fracture of the proximal left femur. There is no dislocation. Mild degenerative changes are noted of both hips. IMPRESSION: Acute, displaced transcervical fracture of the proximal left femur. Electronically Signed   By: Constance Holster M.D.   On: 02/23/2020 18:45        Scheduled Meds: . budesonide  0.25 mg Nebulization BID  . buPROPion  300 mg Oral Daily  . calcium-vitamin D  1 tablet Oral Daily  . Chlorhexidine Gluconate Cloth  6 each Topical Daily  . docusate sodium  100 mg Oral BID  . enoxaparin (LOVENOX) injection  40 mg Subcutaneous Q24H  . famotidine  20 mg Oral QHS  . FLUoxetine  20 mg Oral BID  . folic acid  1 mg Oral Daily  . levothyroxine  112 mcg Oral QAC breakfast  . multivitamin with minerals  1 tablet Oral Daily  . oxcarbazepine  600 mg Oral Daily  . oxyCODONE  10 mg Oral BID  . sucralfate  1 g Oral TID WC & HS  . thiamine  100 mg Oral Daily   Or  . thiamine  100 mg Intravenous Daily   Continuous Infusions: . sodium chloride 100 mL/hr at 02/25/20 0526     LOS: 2 days    Time spent:0 min    Amilya Haver, Geraldo Docker, MD [facility] Pager  [phone]  If 7PM-7AM, please contact night-coverage [email or web address] Password [email or web address] 02/25/2020, 4:01 PM

## 2020-02-25 NOTE — Progress Notes (Signed)
Pt's Foley removed around 2 pm, Pt did not voided yet. Bladder scan showing 17ml. Encouraged pt to drink fluids.

## 2020-02-25 NOTE — NC FL2 (Addendum)
[place] LEVEL OF CARE SCREENING TOOL     IDENTIFICATION  Patient Name: Victoria Levine Birthdate: 09-24-1950 Sex: female Admission Date (Current Location): 02/23/2020  [facility] and [place] Number:  Herbalist and Address:  [facility],  [address] [place], [place]      Provider Number: [id number]  Attending Physician Name and Address:  Allie Bossier, MD  Relative Name and Phone Number:  [place], G[place]ron   [phone]    Current Level of Care: Hospital Recommended Level of Care: [place] Prior Approval Number:    Date Approved/Denied:   PASRR Number: Pending  Discharge Plan: SNF    Current Diagnoses: Patient Active Problem List   Diagnosis Date Noted  . Displaced intertrochanteric fracture of left femur, initial encounter for closed fracture ([place]) 02/23/2020  . Hypokalemia 02/23/2020  . Intractable nausea and vomiting 08/31/2019  . Epigastric pain 08/31/2019  . Alcohol abuse 08/31/2019  . Microcytic anemia 08/31/2019  . Closed compression fracture of L2 vertebra ([place]) 12/11/2017  . Pain 07/26/2015  . Anxiety state 06/28/2015  . Benzodiazepine dependence ([place]) 10/13/2014  . Opioid abuse with opioid-induced mood disorder ([place]) 10/13/2014  . Bipolar 1 disorder, depressed ([place]) 10/12/2014  . Spondylolisthesis of lumbar region 07/28/2014  . Osteoporosis 11/17/2013  . Migraines   . Herpes   . Hypothyroidism 08/13/2008  . HYPERCHOLESTEROLEMIA 08/13/2008  . Bipolar disorder ([place]) 08/13/2008  . Asthma-chronic obstructive pulmonary disease overlap syndrome ([place]) 08/13/2008  . GERD (gastroesophageal reflux disease) 08/13/2008  . HIATAL HERNIA 08/13/2008  . ALLERGY 08/13/2008    Orientation RESPIRATION BLADDER Height & Weight     Self,Time,Situation,Place  Normal Continent Weight: 168 lb (76.2 kg) Height:  5\' 7"  (170.2 cm)  BEHAVIORAL SYMPTOMS/MOOD NEUROLOGICAL BOWEL NUTRITION STATUS       Continent Diet (Regular)  AMBULATORY STATUS COMMUNICATION OF NEEDS Skin   Extensive Assist Verbally Surgical wounds (Incision left hip)                       Personal Care Assistance Level of Assistance  Bathing,Feeding,Dressing Bathing Assistance: Maximum assistance Feeding assistance: Independent Dressing Assistance: Limited assistance     Functional Limitations Info  Sight,Hearing,Speech Sight Info: Adequate Hearing Info: Adequate Speech Info: Adequate    SPECIAL CARE FACTORS FREQUENCY  PT (By licensed PT),OT (By licensed OT)     PT Frequency: 5x/week OT Frequency: 5x/week            Contractures Contractures Info: Not present    Additional Factors Info  Code Status,Allergies Code Status Info: Fullcode Allergies Info: Allergies: Prednisone, Lithium Carbonate, Sulfa Antibiotics           Current Medications (02/25/2020):  This is the current hospital active medication list Current Facility-Administered Medications  Medication Dose Route Frequency Provider Last Rate Last Admin  . 0.9 %  sodium chloride infusion   Intravenous Continuous Rod Can, MD 100 mL/hr at 02/25/20 0526 New Bag at 02/25/20 0526  . acetaminophen (TYLENOL) tablet 650 mg  650 mg Oral Q6H PRN Swinteck, Aaron Edelman, MD       Or  . acetaminophen (TYLENOL) suppository 650 mg  650 mg Rectal Q6H PRN Swinteck, Aaron Edelman, MD      . albuterol (VENTOLIN HFA) 108 (90 Base) MCG/ACT inhaler 2 puff  2 puff Inhalation Q6H PRN Swinteck, Aaron Edelman, MD      . budesonide (PULMICORT) nebulizer solution 0.25 mg  0.25 mg Nebulization BID Rod Can, MD  0.25 mg at 02/25/20 0806  . buPROPion (WELLBUTRIN XL) 24 hr tablet 300 mg  300 mg Oral Daily Rod Can, MD   300 mg at 02/25/20 0819  . calcium-vitamin D (OSCAL WITH D) 500-200 MG-UNIT per tablet 1 tablet  1 tablet Oral Daily Rod Can, MD   1 tablet at 02/25/20 0820  . Chlorhexidine Gluconate Cloth 2 % PADS 6 each  6 each Topical Daily Thereasa Distance  M, RN      . dicyclomine (BENTYL) tablet 20 mg  20 mg Oral Q8H PRN Swinteck, Aaron Edelman, MD      . docusate sodium (COLACE) capsule 100 mg  100 mg Oral BID Rod Can, MD   100 mg at 02/25/20 0818  . enoxaparin (LOVENOX) injection 40 mg  40 mg Subcutaneous Q24H Rod Can, MD   40 mg at 02/25/20 0817  . famotidine (PEPCID) tablet 20 mg  20 mg Oral QHS Rod Can, MD   20 mg at 02/24/20 2237  . FLUoxetine (PROZAC) capsule 20 mg  20 mg Oral BID Rod Can, MD   20 mg at 02/25/20 0818  . folic acid (FOLVITE) tablet 1 mg  1 mg Oral Daily Swinteck, Aaron Edelman, MD   1 mg at 02/25/20 0819  . gabapentin (NEURONTIN) capsule 600 mg  600 mg Oral Daily PRN Rod Can, MD   600 mg at 02/25/20 0819  . HYDROmorphone (DILAUDID) injection 0.5-1 mg  0.5-1 mg Intravenous Q2H PRN Rod Can, MD   1 mg at 02/24/20 1322  . levothyroxine (SYNTHROID) tablet 112 mcg  112 mcg Oral QAC breakfast Rod Can, MD   112 mcg at 02/25/20 0834  . LORazepam (ATIVAN) tablet 1-4 mg  1-4 mg Oral Q1H PRN Rod Can, MD   1 mg at 02/25/20 0818   Or  . LORazepam (ATIVAN) injection 1-4 mg  1-4 mg Intravenous Q1H PRN Rod Can, MD   1 mg at 02/25/20 0027  . menthol-cetylpyridinium (CEPACOL) lozenge 3 mg  1 lozenge Oral PRN Swinteck, Aaron Edelman, MD       Or  . phenol (CHLORASEPTIC) mouth spray 1 spray  1 spray Mouth/Throat PRN Swinteck, Aaron Edelman, MD      . metoCLOPramide (REGLAN) tablet 5-10 mg  5-10 mg Oral Q8H PRN Swinteck, Aaron Edelman, MD       Or  . metoCLOPramide (REGLAN) injection 5-10 mg  5-10 mg Intravenous Q8H PRN Swinteck, Aaron Edelman, MD      . multivitamin with minerals tablet 1 tablet  1 tablet Oral Daily Rod Can, MD   1 tablet at 02/25/20 0819  . ondansetron (ZOFRAN) tablet 4 mg  4 mg Oral Q6H PRN Swinteck, Aaron Edelman, MD       Or  . ondansetron (ZOFRAN) injection 4 mg  4 mg Intravenous Q6H PRN Swinteck, Aaron Edelman, MD      . Oxcarbazepine (TRILEPTAL) tablet 600 mg  600 mg Oral Daily Rod Can, MD   600  mg at 02/25/20 0817  . oxyCODONE (Oxy IR/ROXICODONE) immediate release tablet 10 mg  10 mg Oral Daily PRN Rod Can, MD   10 mg at 02/24/20 2041  . oxyCODONE (OXYCONTIN) 12 hr tablet 10 mg  10 mg Oral BID Rod Can, MD   10 mg at 02/25/20 0818  . polyethylene glycol (MIRALAX / GLYCOLAX) packet 17 g  17 g Oral Daily PRN Swinteck, Aaron Edelman, MD      . QUEtiapine (SEROQUEL) tablet 200 mg  200 mg Oral Daily PRN Rod Can, MD      .  sucralfate (CARAFATE) tablet 1 g  1 g Oral TID WC & HS Rod Can, MD   1 g at 02/25/20 1240  . SUMAtriptan (IMITREX) tablet 100 mg  100 mg Oral Q2H PRN Allie Bossier, MD      . thiamine tablet 100 mg  100 mg Oral Daily Rod Can, MD   100 mg at 02/25/20 0818   Or  . thiamine (B-1) injection 100 mg  100 mg Intravenous Daily Rod Can, MD         Discharge Medications: Please see discharge summary for a list of discharge medications.  Relevant Imaging Results:  Relevant Lab Results:   Additional Information [id number]  Lia Hopping, LCSW

## 2020-02-25 NOTE — Evaluation (Signed)
Physical Therapy Evaluation Patient Details Name: Victoria Levine MRN: [id number] DOB: 1950/05/26 Today's Date: 02/25/2020   History of Present Illness  Pt s/p fall with L hip fx and now s/p L THR by ant direct approach.  Pt with hx of comp fx, kyphoplasty, lumbar fusion and bipolar.  Clinical Impression  Pt admitted as above and presenting with functional mobility limitations 2* decreased L LE strength/ROM, post op pain, balance deficits and limited endurance.  Pt would benefit from follow up rehab at SNF level to maximize IND and safety prior to dc home with limited assist.    Follow Up Recommendations SNF    Equipment Recommendations  None recommended by PT    Recommendations for Other Services       Precautions / Restrictions Precautions Precautions: Fall Restrictions Weight Bearing Restrictions: No LLE Weight Bearing: Weight bearing as tolerated      Mobility  Bed Mobility Overal bed mobility: Needs Assistance Bed Mobility: Supine to Sit;Sit to Supine     Supine to sit: Mod assist;+2 for physical assistance;+2 for safety/equipment Sit to supine: Mod assist;+2 for safety/equipment;+2 for physical assistance   General bed mobility comments: Increased time with cues for sequence and use of R LE to self assist    Transfers Overall transfer level: Needs assistance Equipment used: Rolling walker (2 wheeled) Transfers: Sit to/from Stand Sit to Stand: Mod assist;+2 physical assistance;+2 safety/equipment;From elevated surface         General transfer comment: cues for LE management and use of UEs to self assist.  Physical assist to bring wt up and fwd and to balance in intial standing.  Ambulation/Gait Ambulation/Gait assistance: Mod assist;+2 physical assistance;+2 safety/equipment Gait Distance (Feet): 1 Feet Assistive device: Rolling walker (2 wheeled) Gait Pattern/deviations: Step-to pattern;Decreased step length - right;Decreased step length -  left;Shuffle;Trunk flexed     General Gait Details: pt able to take single step fwd and then states "I feel so weak I need to sit down"  Pt returned to EOB sitting  Stairs            Wheelchair Mobility    Modified Rankin (Stroke Patients Only)       Balance Overall balance assessment: Needs assistance Sitting-balance support: Feet supported;No upper extremity supported Sitting balance-Leahy Scale: Fair     Standing balance support: Bilateral upper extremity supported Standing balance-Leahy Scale: Poor                               Pertinent Vitals/Pain Pain Assessment: Faces Faces Pain Scale: Hurts little more Pain Location: l hip Pain Descriptors / Indicators: Aching;Sore Pain Intervention(s): Limited activity within patient's tolerance;Monitored during session;Premedicated before session;Ice applied    Home Living Family/patient expects to be discharged to:: Unsure Living Arrangements: Alone               Additional Comments: Does not have 24/7    Prior Function Level of Independence: Independent with assistive device(s)         Comments: Pt states she only uses a rollator to walk outside.  She does not take it to store or in community.     Hand Dominance   Dominant Hand: Right    Extremity/Trunk Assessment   Upper Extremity Assessment Upper Extremity Assessment: Generalized weakness    Lower Extremity Assessment Lower Extremity Assessment: LLE deficits/detail;Generalized weakness LLE Deficits / Details: 2/5 strength at hip with AAROM to 80 flex and 15 abd  Communication      Cognition Arousal/Alertness: Lethargic;Suspect due to medications Behavior During Therapy: Anxious Overall Cognitive Status: No family/caregiver present to determine baseline cognitive functioning                                 General Comments: Slow processing and difficulty problem solving - ? meds      General Comments       Exercises Total Joint Exercises Ankle Circles/Pumps: AROM;Both;15 reps;Supine Heel Slides: AAROM;Left;20 reps;Supine Hip ABduction/ADduction: AAROM;Left;15 reps;Supine   Assessment/Plan    PT Assessment Patient needs continued PT services  PT Problem List Decreased strength;Decreased range of motion;Decreased activity tolerance;Decreased balance;Decreased mobility;Decreased knowledge of use of DME;Pain       PT Treatment Interventions DME instruction;Gait training;Stair training;Functional mobility training;Therapeutic activities;Therapeutic exercise;Patient/family education;Balance training    PT Goals (Current goals can be found in the Care Plan section)  Acute Rehab PT Goals Patient Stated Goal: Regain IND PT Goal Formulation: With patient Time For Goal Achievement: 03/10/20 Potential to Achieve Goals: Fair    Frequency Min 3X/week   Barriers to discharge Decreased caregiver support Pt does not have 24/7 assist    Co-evaluation               AM-PAC PT "6 Clicks" Mobility  Outcome Measure Help needed turning from your back to your side while in a flat bed without using bedrails?: A Lot Help needed moving from lying on your back to sitting on the side of a flat bed without using bedrails?: A Lot Help needed moving to and from a bed to a chair (including a wheelchair)?: A Lot Help needed standing up from a chair using your arms (e.g., wheelchair or bedside chair)?: A Lot Help needed to walk in hospital room?: A Lot Help needed climbing 3-5 steps with a railing? : Total 6 Click Score: 11    End of Session Equipment Utilized During Treatment: Gait belt Activity Tolerance: Patient limited by fatigue Patient left: in bed;with call bell/phone within reach;with bed alarm set;with nursing/sitter in room Nurse Communication: Mobility status PT Visit Diagnosis: Difficulty in walking, not elsewhere classified (R26.2);History of falling (Z91.81)    Time: 0109-3235 PT  Time Calculation (min) (ACUTE ONLY): 48 min   Charges:   PT Evaluation $PT Eval Low Complexity: 1 Low PT Treatments $Therapeutic Exercise: 8-22 mins $Therapeutic Activity: 8-22 mins        Debe Coder PT Acute Rehabilitation Services Pager [phone] Office [phone]   Dimitrious Micciche 02/25/2020, 1:20 PM

## 2020-02-25 NOTE — TOC Initial Note (Addendum)
Transition of Care [facility]) - Initial/Assessment Note    Patient Details  Name: Victoria Levine MRN: [id number] Date of Birth: May 15, 1950  Transition of [facility]) CM/SW Contact:    Lia Hopping, [place] Phone Number: 02/25/2020, 3:33 PM  Clinical Narrative:                 Patient admitted after fall and found to have a displaced intertrochanteric fracture of left femur. Patient underwent procedure for left total hip arthroplasty.      Re: SNF placement          Patient alert x3. CSW discussed discharge plan to SNF. Patient agreeable to SNF for short rehab. Patient reports she has been to SNF before however could not not recall the names. Patient reports she lives alone and is usually independent with her ADL's. She reports a few falls at home from loosing her balance. Patient does not usually walk with an assistance device. Patient gave CSW permission to contact her brother. CSW left a confidential voicemail.   Pt. Brother express concerns about the patient living alone and has discussed moving into an independent or assisted living with the patient. The patient has reservations because of the room sizes etc. He is concern about the patient falling at home. He reports she has a rolling walker but refuses to use it.  He would like to kept updated about facility choice and when the patient transitions.   FL2 completed.  PASRR level II pending manuel review.   [place] authorization pending [phone].  Clinicals faxed to [phone]. Patient will need a covid test   Expected Discharge Plan: Skilled Nursing Facility Barriers to Discharge: Continued Medical Work up,Insurance [place] (PASRR)   Patient Goals and CMS Choice Patient states their goals for this hospitalization and ongoing recovery are:: go to rehab CMS Medicare.gov Compare Post Acute Care list provided to:: Patient Choice offered to / list presented to : Patient  Expected  Discharge Plan and Services Expected Discharge Plan: [place] In-house Referral: Clinical Social Work Discharge Planning Services: CM Consult Post Acute Care Choice: [place] arrangements for the past 2 months: [place]                                      Prior Living Arrangements/Services Living arrangements for the past 2 months: Single Family Home Lives with:: Self Patient language and need for interpreter reviewed:: No Do you feel safe going back to the place where you live?: Yes      Need for Family Participation in Patient Care: Yes (Comment) Care giver support system in place?: Yes (comment)   Criminal Activity/Legal Involvement Pertinent to Current Situation/Hospitalization: No - Comment as needed  Activities of Daily Living Home Assistive Devices/Equipment: Eyeglasses,Bedside commode/3-in-1 ADL Screening (condition at time of admission) Patient's cognitive ability adequate to safely complete daily activities?: Yes Is the patient deaf or have difficulty hearing?: No Does the patient have difficulty seeing, even when wearing glasses/contacts?: No Does the patient have difficulty concentrating, remembering, or making decisions?: Yes (some memory problems at night for 2 weeks per pt) Patient able to express need for assistance with ADLs?: Yes Does the patient have difficulty dressing or bathing?: No Independently performs ADLs?: No Communication: Independent Dressing (OT): Needs assistance Is this a change from baseline?: Change from baseline, expected to last >3 days Grooming: Independent Feeding:  Independent Bathing: Needs assistance Is this a change from baseline?: Change from baseline, expected to last >3 days Toileting: Needs assistance Is this a change from baseline?: Change from baseline, expected to last >3days In/Out Bed: Needs assistance Is this a change from baseline?: Change from baseline, expected to  last >3 days Walks in Home: Needs assistance Is this a change from baseline?: Change from baseline, expected to last >3 days Does the patient have difficulty walking or climbing stairs?: Yes Weakness of Legs: Left Weakness of Arms/Hands: None  Permission Sought/Granted Permission sought to share information with : Family Supports Permission granted to share information with : Yes, Verbal Permission Granted  Share Information with NAME: Ghosh,John Brother  Permission granted to share info w AGENCY: SNF's in the area  Permission granted to share info w Relationship: Brother  Permission granted to share info w Contact Information: [phone]  Emotional Assessment Appearance:: Appears stated age Attitude/Demeanor/Rapport: Engaged Affect (typically observed): Accepting Orientation: : Oriented to Self,Oriented to Situation,Oriented to  Time,Oriented to Place Alcohol / Substance Use: Illicit Drugs Psych Involvement: No (comment)  Admission diagnosis:  Fall [W19.XXXA] Closed fracture of left hip, initial encounter ([place]) [S72.002A] Fall, initial encounter [W19.XXXA] Hip pain, acute, left [M25.552] Displaced intertrochanteric fracture of left femur, initial encounter for closed fracture [facility]) [S72.142A] Patient Active Problem List   Diagnosis Date Noted  . Displaced intertrochanteric fracture of left femur, initial encounter for closed fracture ([place]) 02/23/2020  . Hypokalemia 02/23/2020  . Intractable nausea and vomiting 08/31/2019  . Epigastric pain 08/31/2019  . Alcohol abuse 08/31/2019  . Microcytic anemia 08/31/2019  . Closed compression fracture of L2 vertebra ([place]) 12/11/2017  . Pain 07/26/2015  . Anxiety state 06/28/2015  . Benzodiazepine dependence ([place]) 10/13/2014  . Opioid abuse with opioid-induced mood disorder ([place]) 10/13/2014  . Bipolar 1 disorder, depressed ([place]) 10/12/2014  . Spondylolisthesis of lumbar region 07/28/2014  . Osteoporosis 11/17/2013  . Migraines   .  Herpes   . Hypothyroidism 08/13/2008  . HYPERCHOLESTEROLEMIA 08/13/2008  . Bipolar disorder ([place]) 08/13/2008  . Asthma-chronic obstructive pulmonary disease overlap syndrome ([place]) 08/13/2008  . GERD (gastroesophageal reflux disease) 08/13/2008  . HIATAL HERNIA 08/13/2008  . ALLERGY 08/13/2008   PCP:  [name], MD Pharmacy:   [id number] - [place], [address] AT [address] [place] [address] Phone: [phone] Fax: [phone]     Social Determinants of Health (SDOH) Interventions    Readmission Risk Interventions Readmission Risk Prevention Plan 02/25/2020  Transportation Screening Complete  Medication Review [employer]) Complete  PCP or Specialist appointment within 3-5 days of discharge Complete  SW Recovery Care/Counseling Consult Complete  Skilled Nursing Facility Complete  Some recent data might be hidden

## 2020-02-26 LAB — COMPREHENSIVE METABOLIC PANEL
ALT: 16 U/L (ref 0–44)
AST: 28 U/L (ref 15–41)
Albumin: 2.9 g/dL — ABNORMAL LOW (ref 3.5–5.0)
Alkaline Phosphatase: 51 U/L (ref 38–126)
Anion gap: 8 (ref 5–15)
BUN: 13 mg/dL (ref 8–23)
CO2: 23 mmol/L (ref 22–32)
Calcium: 8.5 mg/dL — ABNORMAL LOW (ref 8.9–10.3)
Chloride: 105 mmol/L (ref 98–111)
Creatinine, Ser: 0.68 mg/dL (ref 0.44–1.00)
GFR, Estimated: >60 mL/min (ref >60)
Glucose, Bld: 150 mg/dL — ABNORMAL HIGH (ref 70–99)
Potassium: 4.2 mmol/L (ref 3.5–5.1)
Sodium: 136 mmol/L (ref 135–145)
Total Bilirubin: 0.4 mg/dL (ref 0.3–1.2)
Total Protein: 5.8 g/dL — ABNORMAL LOW (ref 6.5–8.1)

## 2020-02-26 LAB — CBC WITH DIFFERENTIAL/PLATELET
Abs Immature Granulocytes: 0.07 10*3/uL (ref 0.00–0.07)
Basophils Absolute: 0 10*3/uL (ref 0.0–0.1)
Basophils Relative: 0 %
Eosinophils Absolute: 0.1 10*3/uL (ref 0.0–0.5)
Eosinophils Relative: 1 %
HCT: 29.5 % — ABNORMAL LOW (ref 36.0–46.0)
Hemoglobin: 8.8 g/dL — ABNORMAL LOW (ref 12.0–15.0)
Immature Granulocytes: 1 %
Lymphocytes Relative: 9 %
Lymphs Abs: 0.9 10*3/uL (ref 0.7–4.0)
MCH: 25.8 pg — ABNORMAL LOW (ref 26.0–34.0)
MCHC: 29.8 g/dL — ABNORMAL LOW (ref 30.0–36.0)
MCV: 86.5 fL (ref 80.0–100.0)
Monocytes Absolute: 1 10*3/uL (ref 0.1–1.0)
Monocytes Relative: 10 %
Neutro Abs: 8.3 10*3/uL — ABNORMAL HIGH (ref 1.7–7.7)
Neutrophils Relative %: 79 %
Platelets: 223 10*3/uL (ref 150–400)
RBC: 3.41 MIL/uL — ABNORMAL LOW (ref 3.87–5.11)
RDW: 15.1 % (ref 11.5–15.5)
WBC: 10.5 10*3/uL (ref 4.0–10.5)
nRBC: 0 % (ref 0.0–0.2)

## 2020-02-26 LAB — LIPID PANEL
Cholesterol: 160 mg/dL (ref 0–200)
HDL: 46 mg/dL (ref >40)
LDL Cholesterol: 96 mg/dL (ref 0–99)
Total CHOL/HDL Ratio: 3.5 RATIO
Triglycerides: 89 mg/dL (ref <150)
VLDL: 18 mg/dL (ref 0–40)

## 2020-02-26 LAB — SARS CORONAVIRUS 2 BY RT PCR (HOSPITAL ORDER, PERFORMED IN ~~LOC~~ HOSPITAL LAB): SARS Coronavirus 2: NEGATIVE

## 2020-02-26 LAB — PHOSPHORUS: Phosphorus: 1.6 mg/dL — ABNORMAL LOW (ref 2.5–4.6)

## 2020-02-26 LAB — MAGNESIUM: Magnesium: 2.1 mg/dL (ref 1.7–2.4)

## 2020-02-26 MED ORDER — OXYCODONE HCL 10 MG PO TABS: 10.0000 mg | ORAL_TABLET | ORAL | 0 refills | Status: DC | PRN

## 2020-02-26 MED ORDER — ENOXAPARIN SODIUM 40 MG/0.4ML ~~LOC~~ SOLN
40.0000 mg | SUBCUTANEOUS | 0 refills | Status: DC
Start: 2020-02-27 — End: 2021-01-15

## 2020-02-26 MED ORDER — SODIUM PHOSPHATES 45 MMOLE/15ML IV SOLN
20.0000 mmol | Freq: Once | INTRAVENOUS | Status: AC
  Administered 2020-02-26: 20 mmol via INTRAVENOUS
  Filled 2020-02-26: qty 6.67

## 2020-02-26 NOTE — Plan of Care (Signed)
  Problem: Activity: Goal: Risk for activity intolerance will decrease Outcome: Progressing   Problem: Pain Managment: Goal: General experience of comfort will improve Outcome: Progressing   Problem: Coping: Goal: Level of anxiety will decrease Outcome: Progressing

## 2020-02-26 NOTE — Plan of Care (Signed)
Plan of care reviewed and discussed with the patient. 

## 2020-02-26 NOTE — TOC Progression Note (Signed)
Transition of Care [facility]) - Progression Note    Patient Details  Name: STEPHINIE BATTISTI MRN: [id number] Date of Birth: 1950/08/06  Transition of Care [facility]) CM/SW [place], LCSW Phone Number: 02/26/2020, 2:10 PM  Clinical Narrative:    Audelia Hives PASRR screening completed at bedside with [place].  PASRR pending.  CSW provided medicare list of SNF bed offers. Patient requested some time make a decision. CSW will follow up later today.   Expected Discharge Plan: [place] Barriers to Discharge: Continued Medical Work up,Insurance [place] (PASRR)  Expected Discharge Plan and Services Expected Discharge Plan: [place] In-house Referral: Clinical Social Work Discharge Planning Services: CM Consult Post Acute Care Choice: [place] arrangements for the past 2 months: [place] Determinants of Health (SDOH) Interventions    Readmission Risk Interventions Readmission Risk Prevention Plan 02/25/2020  Transportation Screening Complete  Medication Review Press photographer) Complete  PCP or Specialist appointment within 3-5 days of discharge Complete  SW Recovery Care/Counseling Consult Complete  Skilled Nursing Facility Complete  Some recent data might be hidden

## 2020-02-26 NOTE — Care Management Important Message (Signed)
Important Message  Patient Details IM Letter given to the Patient. Name: Victoria Levine MRN: [id number] Date of Birth: Oct 18, 1950   Medicare Important Message Given:  Yes     Kerin Salen 02/26/2020, 1:13 PM

## 2020-02-26 NOTE — Progress Notes (Signed)
Physical Therapy Treatment Patient Details Name: Victoria Levine MRN: [id number] DOB: 08-Aug-1950 Today's Date: 02/26/2020    History of Present Illness Pt s/p fall with L hip fx and now s/p L THR by ant direct approach.  Pt with hx of comp fx, kyphoplasty, lumbar fusion and bipolar.    PT Comments    Pt cooperative but progressing slowly with mobility.  Pt continues to require significant assist of 2 for performance of all basic mobility tasks and would benefit from follow up rehab at SNF level to maximize IND and safety prior to return home with limited assist.  Follow Up Recommendations  SNF     Equipment Recommendations  None recommended by PT    Recommendations for Other Services       Precautions / Restrictions Precautions Precautions: Fall Restrictions Weight Bearing Restrictions: No LLE Weight Bearing: Weight bearing as tolerated    Mobility  Bed Mobility Overal bed mobility: Needs Assistance Bed Mobility: Supine to Sit     Supine to sit: Mod assist     General bed mobility comments: Increased time with cues for sequence and use of R LE to self assist  Transfers Overall transfer level: Needs assistance Equipment used: Rolling walker (2 wheeled) Transfers: Sit to/from Stand Sit to Stand: Mod assist;+2 physical assistance;+2 safety/equipment;From elevated surface         General transfer comment: cues for LE management and use of UEs to self assist.  Physical assist to bring wt up and fwd and to balance in intial standing.  Ambulation/Gait Ambulation/Gait assistance: +2 physical assistance;+2 safety/equipment;Min assist;Mod assist Gait Distance (Feet): 8 Feet Assistive device: Rolling walker (2 wheeled) Gait Pattern/deviations: Step-to pattern;Decreased step length - right;Decreased step length - left;Shuffle;Trunk flexed Gait velocity: decr   General Gait Details: cues for sequence, posture, position from [employer]              Wheelchair Mobility    Modified Rankin (Stroke Patients Only)       Balance Overall balance assessment: Needs assistance Sitting-balance support: Feet supported;No upper extremity supported Sitting balance-Leahy Scale: Fair     Standing balance support: Bilateral upper extremity supported Standing balance-Leahy Scale: Poor                              Cognition Arousal/Alertness: Awake/alert Behavior During Therapy: Anxious;Impulsive Overall Cognitive Status: No family/caregiver present to determine baseline cognitive functioning                                 General Comments: Pt appears to be more confused, repeatedly asking same questions regarding who staff works for and talking about return home depsite inability to [employer].      Exercises Total Joint Exercises Ankle Circles/Pumps: AROM;Both;15 reps;Supine Heel Slides: AAROM;Left;20 reps;Supine Hip ABduction/ADduction: AAROM;Left;15 reps;Supine    General Comments        Pertinent Vitals/Pain Pain Assessment: Faces Faces Pain Scale: Hurts little more Pain Location: l hip Pain Descriptors / Indicators: Aching;Sore Pain Intervention(s): Limited activity within patient's tolerance;Monitored during session;Premedicated before session;Ice applied    Home Living                      Prior Function            PT Goals (current goals can now be found in the care plan section) Acute  Rehab PT Goals Patient Stated Goal: Regain IND PT Goal Formulation: With patient Time For Goal Achievement: 03/10/20 Potential to Achieve Goals: Fair Progress towards PT goals: Progressing toward goals    Frequency    Min 3X/week      PT Plan Current plan remains appropriate    Co-evaluation              AM-PAC PT "6 Clicks" Mobility   Outcome Measure  Help needed turning from your back to your side while in a flat bed without using bedrails?: A Lot Help needed moving from  lying on your back to sitting on the side of a flat bed without using bedrails?: A Lot Help needed moving to and from a bed to a chair (including a wheelchair)?: A Lot Help needed standing up from a chair using your arms (e.g., wheelchair or bedside chair)?: A Lot Help needed to walk in hospital room?: A Lot Help needed climbing 3-5 steps with a railing? : Total 6 Click Score: 11    End of Session Equipment Utilized During Treatment: Gait belt Activity Tolerance: Patient tolerated treatment well Patient left: in chair;with call bell/phone within reach;with chair alarm set;with nursing/sitter in room Nurse Communication: Mobility status PT Visit Diagnosis: Difficulty in walking, not elsewhere classified (R26.2);History of falling (Z91.81)     Time: 0931-1000 PT Time Calculation (min) (ACUTE ONLY): 29 min  Charges:  $Gait Training: 8-22 mins $Therapeutic Exercise: 8-22 mins                     Debe Coder PT Acute Rehabilitation Services Pager [phone] Office [phone]    Victoria Levine 02/26/2020, 12:35 PM

## 2020-02-26 NOTE — Progress Notes (Signed)
PROGRESS NOTE    Victoria Levine  SJG:[id number] DOB: June 24, 1950 DOA: 02/23/2020 PCP: [name], MD     Brief Narrative:  Victoria Levine is a 69 y.o. WF PMHx Suicide attempt, EtOH abuse, Bipolar disorder, Herpes, anxiety DO, degenerative disc disease, GERD, asthma, hypothyroidism, osteoporosis   who had a mechanical fall at home apparently.  Came to the ER complaining of left hip pain.  She fell on her left side.  The pain is 9 out of 10.  Worse on the left side.  Radiating down her leg.  Denied any other site of pain.  Denied any fever or chills denied any nausea vomiting or diarrhea.  Patient was seen and evaluated.  She appears to have left displaced intertrochanteric femur fracture.  Orthopedics consulted and patient being admitted to the hospital for possible repair..  ED Course: Temperature 98.8 blood pressure 204/117, pulse 93 respiratory rate 22 oxygen sats 91% room air.  White count 14.4 hemoglobin 12.8 platelets 362.  Sodium 133 potassium 3.2 chloride 97 CO2 21.  COVID-19 screen is negative.  X-ray of the left ankle chest x-ray knee were all negative x-ray of the left hip shows displaced femoral neck fracture.  Orthopedics consulted and patient being admitted to the hospital for further evaluation and treatment.  Review of Systems: As per HPI otherwise 10 point review of systems negative.    Subjective: 12/10 afebrile overnight A/O x4.  Patient very calm and courteous today.   Assessment & Plan: Covid vaccination;   Principal Problem:   Displaced intertrochanteric fracture of left femur, initial encounter for closed fracture [facility]) Active Problems:   Hypothyroidism   HYPERCHOLESTEROLEMIA   Bipolar disorder ([place])   Asthma-chronic obstructive pulmonary disease overlap syndrome (HCC)   GERD (gastroesophageal reflux disease)   Osteoporosis   Alcohol abuse   Hypokalemia   Displaced intertrochanteric fracture of the LEFT femur:  -S/p Left total hip  arthroplasty -12/10 per orthopedic surgery  WBAT with walker  Osteoporosis -Continue home meds  EtOH -Last drink per patient 2 days ago -CIWA protocol  Bipolar DO -Continue home meds  Hypothyroidism -Continue home meds  Asthma/COPD -Continue home meds  HLD -Lipid panel pending  Hypokalemia -Potassium goal> 4  Hypophosphatemia -Phosphorus goal> 25 -12/10 phosphate 20 mmol     DVT prophylaxis: Lovenox Code Status: Full Family Communication:     Dispo: The patient is from: Home              Anticipated d/c is to: SNF?              Anticipated d/c date is: Per surgery              Patient currently unstable      Consultants:  Orthopedic surgery   procedures/Significant Events:  12/8 Left total hip arthroplasty, anterior approach    I have personally reviewed and interpreted all radiology studies and my findings are as above.  VENTILATOR SETTINGS:    Cultures 12/10 SARS coronavirus negative 12/10 influenza A/B negative 12/10 SARS coronavirus negative  Antimicrobials: Anti-infectives (From admission, onward)   Start     Ordered Stop   02/24/20 2030  ceFAZolin (ANCEF) IVPB 2g/100 mL premix        02/24/20 1801 02/25/20 0254   02/24/20 1400  ceFAZolin (ANCEF) IVPB 2g/100 mL premix        02/24/20 1343 02/24/20 1502       Devices    LINES / TUBES:      Continuous  Infusions: . sodium chloride 100 mL/hr at 02/25/20 0526     Objective: Vitals:   02/25/20 1943 02/25/20 2218 02/26/20 0420 02/26/20 0756  BP:  (!) 144/88 (!) 137/96   Pulse:  92 88   Resp:  17 16   Temp:  97.8 F (36.6 C) 99.5 F (37.5 C)   TempSrc:  Axillary Oral   SpO2: 93% 95% 98% 99%  Weight:      Height:        Intake/Output Summary (Last 24 hours) at 02/26/2020 1006 Last data filed at 02/26/2020 7353 Gross per 24 hour  Intake 1000 ml  Output 325 ml  Net 675 ml   Filed Weights   02/23/20 1701  Weight: 76.2 kg   Physical Exam:  General: A/O  x4, No acute respiratory distress Eyes: negative scleral hemorrhage, negative anisocoria, negative icterus ENT: Negative Runny nose, negative gingival bleeding, Neck:  Negative scars, masses, torticollis, lymphadenopathy, JVD Lungs: Clear to auscultation bilaterally without wheezes or crackles Cardiovascular: Regular rate and rhythm without murmur gallop or rub normal S1 and S2 Abdomen: negative abdominal pain, nondistended, positive soft, bowel sounds, no rebound, no ascites, no appreciable mass Extremities: left leg appropriately tender for surgery performed on 12/8 Skin: Negative rashes, lesions, ulcers Psychiatric:  Negative depression, negative anxiety, negative fatigue, negative mania  Central nervous system:  Cranial nerves II through XII intact, tongue/uvula midline, all extremities muscle strength 5/5, sensation intact throughout,  negative dysarthria, negative expressive aphasia, negative receptive aphasia..     Data Reviewed: Care during the described time interval was provided by me .  I have reviewed this patient's available data, including medical history, events of note, physical examination, and all test results as part of my evaluation.  CBC: Recent Labs  Lab 02/23/20 1702 02/24/20 0625 02/25/20 0333 02/26/20 0350  WBC 14.4* 13.2* 11.4* 10.5  NEUTROABS 13.0*  --  8.5* 8.3*  HGB 12.8 11.3* 9.8* 8.8*  HCT 41.5 38.0 34.8* 29.5*  MCV 82.3 85.4 89.0 86.5  PLT 362 269 237 299   Basic Metabolic Panel: Recent Labs  Lab 02/23/20 1702 02/24/20 0625 02/25/20 0333 02/26/20 0350  NA 133* 135 134* 136  K 3.2* 3.1*  3.0* 4.1 4.2  CL 97* 103 105 105  CO2 21* 22 22 23   GLUCOSE 188* 124* 111* 150*  BUN 9 13 17 13   CREATININE 0.62 0.80 0.85 0.68  CALCIUM 9.9 8.8* 8.4* 8.5*  MG  --  1.9  1.8 2.0 2.1  PHOS  --  2.9  --  1.6*   GFR: Estimated Creatinine Clearance: 69.6 mL/min (by C-G formula based on SCr of 0.68 mg/dL). Liver Function Tests: Recent Labs  Lab  02/24/20 0625 02/25/20 0333 02/26/20 0350  AST 21 26 28   ALT 16 15 16   ALKPHOS 58 52 51  BILITOT 0.7 0.5 0.4  PROT 6.7 5.8* 5.8*  ALBUMIN 3.7 3.1* 2.9*   No results for input(s): LIPASE, AMYLASE in the last 168 hours. No results for input(s): AMMONIA in the last 168 hours. Coagulation Profile: Recent Labs  Lab 02/23/20 1702  INR 1.1   Cardiac Enzymes: Recent Labs  Lab 02/23/20 1702  CKTOTAL 373*   BNP (last 3 results) No results for input(s): PROBNP in the last 8760 hours. HbA1C: No results for input(s): HGBA1C in the last 72 hours. CBG: Recent Labs  Lab 02/23/20 1653  GLUCAP 195*   Lipid Profile: Recent Labs    02/26/20 0350  CHOL 160  HDL 46  LDLCALC 96  TRIG 89  CHOLHDL 3.5   Thyroid Function Tests: No results for input(s): TSH, T4TOTAL, FREET4, T3FREE, THYROIDAB in the last 72 hours. Anemia Panel: No results for input(s): VITAMINB12, FOLATE, FERRITIN, TIBC, IRON, RETICCTPCT in the last 72 hours. Sepsis Labs: No results for input(s): PROCALCITON, LATICACIDVEN in the last 168 hours.  Recent Results (from the past 240 hour(s))  Resp Panel by RT-PCR (Flu A&B, Covid) Nasopharyngeal Swab     Status: None   Collection Time: 02/23/20  6:48 PM   Specimen: Nasopharyngeal Swab; Nasopharyngeal(NP) swabs in vial transport medium  Result Value Ref Range Status   SARS Coronavirus 2 by RT PCR NEGATIVE NEGATIVE Final    Comment: (NOTE) SARS-CoV-2 target nucleic acids are NOT DETECTED.  The SARS-CoV-2 RNA is generally detectable in upper respiratory specimens during the acute phase of infection. The lowest concentration of SARS-CoV-2 viral copies this assay can detect is 138 copies/mL. A negative result does not preclude SARS-Cov-2 infection and should not be used as the sole basis for treatment or other patient management decisions. A negative result may occur with  improper specimen collection/handling, submission of specimen other than nasopharyngeal swab,  presence of viral mutation(s) within the areas targeted by this assay, and inadequate number of viral copies(<138 copies/mL). A negative result must be combined with clinical observations, patient history, and epidemiological information. The expected result is Negative.  Fact Sheet for Patients:  EntrepreneurPulse.com.au  Fact Sheet for Healthcare Providers:  IncredibleEmployment.be  This test is no t yet approved or cleared by the [place] FDA and  has been authorized for detection and/or diagnosis of SARS-CoV-2 by FDA under an Emergency Use Authorization (EUA). This EUA will remain  in effect (meaning this test can be used) for the duration of the COVID-19 declaration under Section 564(b)(1) of the Act, 21 U.S.C.section 360bbb-3(b)(1), unless the authorization is terminated  or revoked sooner.       Influenza A by PCR NEGATIVE NEGATIVE Final   Influenza B by PCR NEGATIVE NEGATIVE Final    Comment: (NOTE) The Xpert Xpress SARS-CoV-2/FLU/RSV plus assay is intended as an aid in the diagnosis of influenza from Nasopharyngeal swab specimens and should not be used as a sole basis for treatment. Nasal washings and aspirates are unacceptable for Xpert Xpress SARS-CoV-2/FLU/RSV testing.  Fact Sheet for Patients: EntrepreneurPulse.com.au  Fact Sheet for Healthcare Providers: IncredibleEmployment.be  This test is not yet approved or cleared by the [place] FDA and has been authorized for detection and/or diagnosis of SARS-CoV-2 by FDA under an Emergency Use Authorization (EUA). This EUA will remain in effect (meaning this test can be used) for the duration of the COVID-19 declaration under Section 564(b)(1) of the Act, 21 U.S.C. section 360bbb-3(b)(1), unless the authorization is terminated or revoked.  Performed at [facility], [place] [address] [place], [place] [address]    Surgical pcr screen     Status: None   Collection Time: 02/23/20 11:49 PM   Specimen: Nasal Mucosa; Nasal Swab  Result Value Ref Range Status   MRSA, PCR NEGATIVE NEGATIVE Final   Staphylococcus aureus NEGATIVE NEGATIVE Final    Comment: (NOTE) The Xpert SA Assay (FDA approved for NASAL specimens in patients 19 years of age and older), is one component of a comprehensive surveillance program. It is not intended to diagnose infection nor to guide or monitor treatment. Performed at [facility], [place] [address], [place], [place] [address]  Radiology Studies: Pelvis Portable  Result Date: 02/24/2020 CLINICAL DATA:  69 year old female status post left hip arthroplasty today following posttraumatic fracture. EXAM: PORTABLE PELVIS 1-2 VIEWS COMPARISON:  Intraoperative images 160 1 hours today. FINDINGS: AP view at 1710 hours. Left bipolar hip arthroplasty with intact hardware and normal AP alignment. No unexpected osseous changes. Regional soft tissue gas, overlying skin staples. IMPRESSION: Left bipolar hip arthroplasty with no adverse features. Electronically Signed   By: Genevie Ann M.D.   On: 02/24/2020 19:33   DG C-Arm 1-60 Min-No Report  Result Date: 02/24/2020 Fluoroscopy was utilized by the requesting physician.  No radiographic interpretation.   DG HIP OPERATIVE UNILAT W OR W/O PELVIS LEFT  Result Date: 02/24/2020 CLINICAL DATA:  Left hip arthroplasty EXAM: OPERATIVE left HIP (WITH PELVIS IF PERFORMED) 2 VIEWS TECHNIQUE: Fluoroscopic spot image(s) were submitted for interpretation post-operatively. COMPARISON:  02/23/2020 FINDINGS: Two low resolution intraoperative spot views of the left hip. Total fluoroscopy time was 10 seconds. The images demonstrate a left hip replacement with intact hardware and normal alignment IMPRESSION: Intraoperative fluoroscopic assistance provided during left hip replacement. Electronically Signed   By: Donavan Foil M.D.   On:  02/24/2020 16:26        Scheduled Meds: . budesonide  0.25 mg Nebulization BID  . buPROPion  300 mg Oral Daily  . calcium-vitamin D  1 tablet Oral Daily  . Chlorhexidine Gluconate Cloth  6 each Topical Daily  . docusate sodium  100 mg Oral BID  . enoxaparin (LOVENOX) injection  40 mg Subcutaneous Q24H  . famotidine  20 mg Oral QHS  . FLUoxetine  20 mg Oral BID  . folic acid  1 mg Oral Daily  . levothyroxine  112 mcg Oral QAC breakfast  . multivitamin with minerals  1 tablet Oral Daily  . oxcarbazepine  600 mg Oral Daily  . oxyCODONE  10 mg Oral BID  . sucralfate  1 g Oral TID WC & HS  . thiamine  100 mg Oral Daily   Or  . thiamine  100 mg Intravenous Daily   Continuous Infusions: . sodium chloride 100 mL/hr at 02/25/20 0526     LOS: 3 days    Time spent:0 min    Ferdie Bakken, Geraldo Docker, MD [facility] Pager [phone]  If 7PM-7AM, please contact night-coverage [email or web address] Password [email or web address] 02/26/2020, 10:06 AM

## 2020-02-26 NOTE — Progress Notes (Signed)
    Subjective:  Patient reports pain as moderate.  Denies CP/SOB, endorses nausea. Patient is alert to person and event  Objective:   VITALS:   Vitals:   02/25/20 2218 02/26/20 0420 02/26/20 0756 02/26/20 1247  BP: (!) 144/88 (!) 137/96  136/78  Pulse: 92 88  77  Resp: 17 16  16   Temp: 97.8 F (36.6 C) 99.5 F (37.5 C)  97.6 F (36.4 C)  TempSrc: Axillary Oral  Oral  SpO2: 95% 98% 99% 97%  Weight:      Height:        NAD ABD soft Neurovascular intact Sensation intact distally Intact pulses distally Dorsiflexion/Plantar flexion intact Incision: dressing C/D/I   Lab Results  Component Value Date   WBC 10.5 02/26/2020   HGB 8.8 (L) 02/26/2020   HCT 29.5 (L) 02/26/2020   MCV 86.5 02/26/2020   PLT 223 02/26/2020   BMET    Component Value Date/Time   NA 136 02/26/2020 0350   K 4.2 02/26/2020 0350   CL 105 02/26/2020 0350   CO2 23 02/26/2020 0350   GLUCOSE 150 (H) 02/26/2020 0350   BUN 13 02/26/2020 0350   CREATININE 0.68 02/26/2020 0350   CREATININE 0.67 12/05/2012 1401   CALCIUM 8.5 (L) 02/26/2020 0350   CALCIUM 9.4 10/31/2011 1152   GFRNONAA >60 02/26/2020 0350   GFRAA >60 12/18/2019 1203     Assessment/Plan: 2 Days Post-Op   Principal Problem:   Displaced intertrochanteric fracture of left femur, initial encounter for closed fracture (HCC) Active Problems:   Hypothyroidism   HYPERCHOLESTEROLEMIA   Bipolar disorder (HCC)   Asthma-chronic obstructive pulmonary disease overlap syndrome (HCC)   GERD (gastroesophageal reflux disease)   Osteoporosis   Alcohol abuse   Hypokalemia   WBAT with walker DVT ppx: Aspirin, SCDs, TEDS PO pain control PT/OT ABLA: Treat per hospitalist Dispo: D/C per hospitalist     Dorothyann Peng 02/26/2020, 1:41 PM [facility] is now [facility] [address], [place], [place] [address] Phone: [phone] [email or web address] Facebook  [employer]

## 2020-02-27 DIAGNOSIS — G9341 Metabolic encephalopathy: Secondary | ICD-10-CM

## 2020-02-27 LAB — CBC WITH DIFFERENTIAL/PLATELET
Abs Immature Granulocytes: 0.06 10*3/uL (ref 0.00–0.07)
Basophils Absolute: 0 10*3/uL (ref 0.0–0.1)
Basophils Relative: 0 %
Eosinophils Absolute: 0.1 10*3/uL (ref 0.0–0.5)
Eosinophils Relative: 1 %
HCT: 26.7 % — ABNORMAL LOW (ref 36.0–46.0)
Hemoglobin: 8.1 g/dL — ABNORMAL LOW (ref 12.0–15.0)
Immature Granulocytes: 1 %
Lymphocytes Relative: 12 %
Lymphs Abs: 1 10*3/uL (ref 0.7–4.0)
MCH: 25.2 pg — ABNORMAL LOW (ref 26.0–34.0)
MCHC: 30.3 g/dL (ref 30.0–36.0)
MCV: 83.2 fL (ref 80.0–100.0)
Monocytes Absolute: 1 10*3/uL (ref 0.1–1.0)
Monocytes Relative: 12 %
Neutro Abs: 6.2 10*3/uL (ref 1.7–7.7)
Neutrophils Relative %: 74 %
Platelets: 248 10*3/uL (ref 150–400)
RBC: 3.21 MIL/uL — ABNORMAL LOW (ref 3.87–5.11)
RDW: 14.6 % (ref 11.5–15.5)
WBC: 8.5 10*3/uL (ref 4.0–10.5)
nRBC: 0 % (ref 0.0–0.2)

## 2020-02-27 LAB — COMPREHENSIVE METABOLIC PANEL
ALT: 20 U/L (ref 0–44)
ALT: 22 U/L (ref 0–44)
AST: 31 U/L (ref 15–41)
AST: 35 U/L (ref 15–41)
Albumin: 3 g/dL — ABNORMAL LOW (ref 3.5–5.0)
Albumin: 3.4 g/dL — ABNORMAL LOW (ref 3.5–5.0)
Alkaline Phosphatase: 51 U/L (ref 38–126)
Alkaline Phosphatase: 55 U/L (ref 38–126)
Anion gap: 9 (ref 5–15)
Anion gap: 9 (ref 5–15)
BUN: 7 mg/dL — ABNORMAL LOW (ref 8–23)
BUN: 7 mg/dL — ABNORMAL LOW (ref 8–23)
CO2: 27 mmol/L (ref 22–32)
CO2: 26 mmol/L (ref 22–32)
Calcium: 9 mg/dL (ref 8.9–10.3)
Calcium: 9 mg/dL (ref 8.9–10.3)
Chloride: 103 mmol/L (ref 98–111)
Chloride: 101 mmol/L (ref 98–111)
Creatinine, Ser: 0.54 mg/dL (ref 0.44–1.00)
Creatinine, Ser: 0.49 mg/dL (ref 0.44–1.00)
GFR, Estimated: >60 mL/min (ref >60)
GFR, Estimated: >60 mL/min (ref >60)
Glucose, Bld: 106 mg/dL — ABNORMAL HIGH (ref 70–99)
Glucose, Bld: 135 mg/dL — ABNORMAL HIGH (ref 70–99)
Potassium: 3.3 mmol/L — ABNORMAL LOW (ref 3.5–5.1)
Potassium: 3.2 mmol/L — ABNORMAL LOW (ref 3.5–5.1)
Sodium: 139 mmol/L (ref 135–145)
Sodium: 136 mmol/L (ref 135–145)
Total Bilirubin: 0.3 mg/dL (ref 0.3–1.2)
Total Bilirubin: 0.5 mg/dL (ref 0.3–1.2)
Total Protein: 6 g/dL — ABNORMAL LOW (ref 6.5–8.1)
Total Protein: 6.4 g/dL — ABNORMAL LOW (ref 6.5–8.1)

## 2020-02-27 LAB — CBC
HCT: 27.9 % — ABNORMAL LOW (ref 36.0–46.0)
Hemoglobin: 8.7 g/dL — ABNORMAL LOW (ref 12.0–15.0)
MCH: 25.7 pg — ABNORMAL LOW (ref 26.0–34.0)
MCHC: 31.2 g/dL (ref 30.0–36.0)
MCV: 82.3 fL (ref 80.0–100.0)
Platelets: 288 10*3/uL (ref 150–400)
RBC: 3.39 MIL/uL — ABNORMAL LOW (ref 3.87–5.11)
RDW: 14.7 % (ref 11.5–15.5)
WBC: 10.3 10*3/uL (ref 4.0–10.5)
nRBC: 0 % (ref 0.0–0.2)

## 2020-02-27 LAB — MAGNESIUM
Magnesium: 1.8 mg/dL (ref 1.7–2.4)
Magnesium: 1.9 mg/dL (ref 1.7–2.4)

## 2020-02-27 LAB — PHOSPHORUS
Phosphorus: 1.2 mg/dL — ABNORMAL LOW (ref 2.5–4.6)
Phosphorus: 1.6 mg/dL — ABNORMAL LOW (ref 2.5–4.6)

## 2020-02-27 MED ORDER — LORAZEPAM 2 MG/ML IJ SOLN
1.0000 mg | INTRAMUSCULAR | Status: AC | PRN
  Administered 2020-02-27 – 2020-02-29 (×10): 2 mg via INTRAVENOUS
  Filled 2020-02-27 (×10): qty 1

## 2020-02-27 MED ORDER — THIAMINE HCL 100 MG/ML IJ SOLN: 100.0000 mg | Freq: Every day | INTRAMUSCULAR | Status: DC

## 2020-02-27 MED ORDER — FOLIC ACID 1 MG PO TABS: 1.0000 mg | ORAL_TABLET | Freq: Every day | ORAL | Status: DC

## 2020-02-27 MED ORDER — LORAZEPAM 1 MG PO TABS
1.0000 mg | ORAL_TABLET | ORAL | Status: AC | PRN
  Administered 2020-02-29: 2 mg via ORAL
  Administered 2020-02-29: 1 mg via ORAL
  Filled 2020-02-27: qty 2
  Filled 2020-02-27: qty 1

## 2020-02-27 MED ORDER — MAGNESIUM SULFATE 2 GM/50ML IV SOLN
2.0000 g | Freq: Once | INTRAVENOUS | Status: AC
  Administered 2020-02-27: 2 g via INTRAVENOUS
  Filled 2020-02-27: qty 50

## 2020-02-27 MED ORDER — THIAMINE HCL 100 MG PO TABS: 100.0000 mg | ORAL_TABLET | Freq: Every day | ORAL | Status: DC

## 2020-02-27 MED ORDER — POTASSIUM PHOSPHATES 15 MMOLE/5ML IV SOLN
40.0000 mmol | Freq: Once | INTRAVENOUS | Status: AC
  Administered 2020-02-27: 40 mmol via INTRAVENOUS
  Filled 2020-02-27: qty 13.33

## 2020-02-27 MED ORDER — ADULT MULTIVITAMIN W/MINERALS CH: 1.0000 | ORAL_TABLET | Freq: Every day | ORAL | Status: DC

## 2020-02-27 NOTE — Plan of Care (Signed)
  Problem: Activity: Goal: Risk for activity intolerance will decrease Outcome: Progressing   Problem: Nutrition: Goal: Adequate nutrition will be maintained Outcome: Progressing   Problem: Health Behavior/Discharge Planning: Goal: Ability to manage health-related needs will improve 02/27/2020 0755 by Olen Cordial, RN Outcome: Not Progressing 02/27/2020 0750 by Olen Cordial, RN Outcome: Not Progressing

## 2020-02-27 NOTE — Progress Notes (Signed)
PROGRESS NOTE    Victoria Levine  ZDG:[id number] DOB: [birth year] DOA: 02/23/2020 PCP: [name], MD     Brief Narrative:  Victoria Levine is a 69 y.o. WF PMHx Suicide attempt, EtOH abuse, Bipolar disorder, Herpes, anxiety DO, degenerative disc disease, GERD, asthma, hypothyroidism, osteoporosis   who had a mechanical fall at home apparently.  Came to the ER complaining of left hip pain.  She fell on her left side.  The pain is 9 out of 10.  Worse on the left side.  Radiating down her leg.  Denied any other site of pain.  Denied any fever or chills denied any nausea vomiting or diarrhea.  Patient was seen and evaluated.  She appears to have left displaced intertrochanteric femur fracture.  Orthopedics consulted and patient being admitted to the hospital for possible repair..  ED Course: Temperature 98.8 blood pressure 204/117, pulse 93 respiratory rate 22 oxygen sats 91% room air.  White count 14.4 hemoglobin 12.8 platelets 362.  Sodium 133 potassium 3.2 chloride 97 CO2 21.  COVID-19 screen is negative.  X-ray of the left ankle chest x-ray knee were all negative x-ray of the left hip shows displaced femoral neck fracture.  Orthopedics consulted and patient being admitted to the hospital for further evaluation and treatment.  Review of Systems: As per HPI otherwise 10 point review of systems negative.    Subjective: 12/11 afebrile overnight.  A/O x0, patient talking as if she is seeing people in the room (hallucinations)    Assessment & Plan: Covid vaccination;   Principal Problem:   Displaced intertrochanteric fracture of left femur, initial encounter for closed fracture [facility]) Active Problems:   Hypothyroidism   HYPERCHOLESTEROLEMIA   Bipolar disorder ([place])   Asthma-chronic obstructive pulmonary disease overlap syndrome (HCC)   GERD (gastroesophageal reflux disease)   Osteoporosis   Alcohol abuse   Hypokalemia   Acute metabolic encephalopathy   Displaced  intertrochanteric fracture of the LEFT femur:  -S/p Left total hip arthroplasty -12/10 per orthopedic surgery  WBAT with walker  Osteoporosis -Continue home meds  Acute metabolic encephalopathy -Alcohol withdrawal?  Overmedication with pain medication? -See EtOH  -12/11 discontinue Dilaudid, OxyIR, gabapentin. -12/11 will only continue OxyContin 10 mg BID -Hopefully this will help clear patient's encephalopathy  EtOH -Last drink per patient 2 days ago -CIWA protocol  Bipolar DO -Continue home meds  Hypothyroidism -Continue home meds  Asthma/COPD -Continue home meds  HLD -Lipid panel pending  Hypokalemia -Potassium goal> 4 -12/11 see hypophosphatemia  Hypophosphatemia -Phosphorus goal> 2.5 -12/11 K-Phos 40 mmol  Hypomagnesmia -Magnesium goal> 2 -Magnesium IV 2 g    DVT prophylaxis: Lovenox Code Status: Full Family Communication:     Dispo: The patient is from: Home              Anticipated d/c is to: SNF?              Anticipated d/c date is: Per surgery              Patient currently unstable      Consultants:  Orthopedic surgery   procedures/Significant Events:  12/8 Left total hip arthroplasty, anterior approach    I have personally reviewed and interpreted all radiology studies and my findings are as above.  VENTILATOR SETTINGS:    Cultures 12/10 SARS coronavirus negative 12/10 influenza A/B negative 12/10 SARS coronavirus negative  Antimicrobials: Anti-infectives (From admission, onward)   Start     Ordered Stop   02/24/20 2030  ceFAZolin (  ANCEF) IVPB 2g/100 mL premix        02/24/20 1801 02/25/20 0254   02/24/20 1400  ceFAZolin (ANCEF) IVPB 2g/100 mL premix        02/24/20 1343 02/24/20 1502       Devices    LINES / TUBES:      Continuous Infusions:  sodium chloride 100 mL/hr at 02/27/20 1914   potassium PHOSPHATE IVPB (in mmol) 40 mmol (02/27/20 1442)     Objective: Vitals:   02/26/20 2054 02/27/20  0510 02/27/20 0754 02/27/20 1257  BP: (!) 150/67 (!) 160/84  (!) 147/84  Pulse: 80 97  91  Resp: 16 16  18   Temp: 99.3 F (37.4 C) 98.5 F (36.9 C)  100.3 F (37.9 C)  TempSrc: Oral Oral  Oral  SpO2: 94% 97% 97% 96%  Weight:      Height:        Intake/Output Summary (Last 24 hours) at 02/27/2020 2029 Last data filed at 02/27/2020 1722 Gross per 24 hour  Intake 758.33 ml  Output 1800 ml  Net -1041.67 ml   Filed Weights   02/23/20 1701  Weight: 76.2 kg   Physical Exam:  General: A/O x4, No acute respiratory distress Eyes: negative scleral hemorrhage, negative anisocoria, negative icterus ENT: Negative Runny nose, negative gingival bleeding, Neck:  Negative scars, masses, torticollis, lymphadenopathy, JVD Lungs: Clear to auscultation bilaterally without wheezes or crackles Cardiovascular: Regular rate and rhythm without murmur gallop or rub normal S1 and S2 Abdomen: negative abdominal pain, nondistended, positive soft, bowel sounds, no rebound, no ascites, no appreciable mass Extremities: left leg appropriately tender for surgery performed on 12/8 Skin: Negative rashes, lesions, ulcers Psychiatric:  Negative depression, negative anxiety, negative fatigue, negative mania  Central nervous system:  Cranial nerves II through XII intact, tongue/uvula midline, all extremities muscle strength 5/5, sensation intact throughout,  negative dysarthria, negative expressive aphasia, negative receptive aphasia..     Data Reviewed: Care during the described time interval was provided by me .  I have reviewed this patient's available data, including medical history, events of note, physical examination, and all test results as part of my evaluation.  CBC: Recent Labs  Lab 02/23/20 1702 02/24/20 0625 02/25/20 0333 02/26/20 0350 02/27/20 0332 02/27/20 1125  WBC 14.4* 13.2* 11.4* 10.5 8.5 10.3  NEUTROABS 13.0*  --  8.5* 8.3* 6.2  --   HGB 12.8 11.3* 9.8* 8.8* 8.1* 8.7*  HCT 41.5 38.0  34.8* 29.5* 26.7* 27.9*  MCV 82.3 85.4 89.0 86.5 83.2 82.3  PLT 362 269 237 223 248 782   Basic Metabolic Panel: Recent Labs  Lab 02/24/20 0625 02/25/20 0333 02/26/20 0350 02/27/20 0332 02/27/20 1125  NA 135 134* 136 139 136  K 3.1*   3.0* 4.1 4.2 3.3* 3.2*  CL 103 105 105 103 101  CO2 22 22 23 27 26   GLUCOSE 124* 111* 150* 106* 135*  BUN 13 17 13  7* 7*  CREATININE 0.80 0.85 0.68 0.54 0.49  CALCIUM 8.8* 8.4* 8.5* 9.0 9.0  MG 1.9   1.8 2.0 2.1 1.8 1.9  PHOS 2.9  --  1.6* 1.2* 1.6*   GFR: Estimated Creatinine Clearance: 69.6 mL/min (by C-G formula based on SCr of 0.49 mg/dL). Liver Function Tests: Recent Labs  Lab 02/24/20 0625 02/25/20 0333 02/26/20 0350 02/27/20 0332 02/27/20 1125  AST 21 26 28 31  35  ALT 16 15 16 20 22   ALKPHOS 58 52 51 51 55  BILITOT 0.7 0.5 0.4 0.3 0.5  PROT 6.7 5.8* 5.8* 6.0* 6.4*  ALBUMIN 3.7 3.1* 2.9* 3.0* 3.4*   No results for input(s): LIPASE, AMYLASE in the last 168 hours. No results for input(s): AMMONIA in the last 168 hours. Coagulation Profile: Recent Labs  Lab 02/23/20 1702  INR 1.1   Cardiac Enzymes: Recent Labs  Lab 02/23/20 1702  CKTOTAL 373*   BNP (last 3 results) No results for input(s): PROBNP in the last 8760 hours. HbA1C: No results for input(s): HGBA1C in the last 72 hours. CBG: Recent Labs  Lab 02/23/20 1653  GLUCAP 195*   Lipid Profile: Recent Labs    02/26/20 0350  CHOL 160  HDL 46  LDLCALC 96  TRIG 89  CHOLHDL 3.5   Thyroid Function Tests: No results for input(s): TSH, T4TOTAL, FREET4, T3FREE, THYROIDAB in the last 72 hours. Anemia Panel: No results for input(s): VITAMINB12, FOLATE, FERRITIN, TIBC, IRON, RETICCTPCT in the last 72 hours. Sepsis Labs: No results for input(s): PROCALCITON, LATICACIDVEN in the last 168 hours.  Recent Results (from the past 240 hour(s))  Resp Panel by RT-PCR (Flu A&B, Covid) Nasopharyngeal Swab     Status: None   Collection Time: 02/23/20  6:48 PM   Specimen:  Nasopharyngeal Swab; Nasopharyngeal(NP) swabs in vial transport medium  Result Value Ref Range Status   SARS Coronavirus 2 by RT PCR NEGATIVE NEGATIVE Final    Comment: (NOTE) SARS-CoV-2 target nucleic acids are NOT DETECTED.  The SARS-CoV-2 RNA is generally detectable in upper respiratory specimens during the acute phase of infection. The lowest concentration of SARS-CoV-2 viral copies this assay can detect is 138 copies/mL. A negative result does not preclude SARS-Cov-2 infection and should not be used as the sole basis for treatment or other patient management decisions. A negative result may occur with  improper specimen collection/handling, submission of specimen other than nasopharyngeal swab, presence of viral mutation(s) within the areas targeted by this assay, and inadequate number of viral copies(<138 copies/mL). A negative result must be combined with clinical observations, patient history, and epidemiological information. The expected result is Negative.  Fact Sheet for Patients:  EntrepreneurPulse.com.au  Fact Sheet for Healthcare Providers:  IncredibleEmployment.be  This test is no t yet approved or cleared by the [place] FDA and  has been authorized for detection and/or diagnosis of SARS-CoV-2 by FDA under an Emergency Use Authorization (EUA). This EUA will remain  in effect (meaning this test can be used) for the duration of the COVID-19 declaration under Section 564(b)(1) of the Act, 21 U.S.C.section 360bbb-3(b)(1), unless the authorization is terminated  or revoked sooner.       Influenza A by PCR NEGATIVE NEGATIVE Final   Influenza B by PCR NEGATIVE NEGATIVE Final    Comment: (NOTE) The Xpert Xpress SARS-CoV-2/FLU/RSV plus assay is intended as an aid in the diagnosis of influenza from Nasopharyngeal swab specimens and should not be used as a sole basis for treatment. Nasal washings and aspirates are unacceptable for  Xpert Xpress SARS-CoV-2/FLU/RSV testing.  Fact Sheet for Patients: EntrepreneurPulse.com.au  Fact Sheet for Healthcare Providers: IncredibleEmployment.be  This test is not yet approved or cleared by the [place] FDA and has been authorized for detection and/or diagnosis of SARS-CoV-2 by FDA under an Emergency Use Authorization (EUA). This EUA will remain in effect (meaning this test can be used) for the duration of the COVID-19 declaration under Section 564(b)(1) of the Act, 21 U.S.C. section 360bbb-3(b)(1), unless the authorization is terminated or revoked.  Performed at [facility], [address] [place], [place] [address]   Surgical pcr screen     Status: None   Collection Time: 02/23/20 11:49 PM   Specimen: Nasal Mucosa; Nasal Swab  Result Value Ref Range Status   MRSA, PCR NEGATIVE NEGATIVE Final   Staphylococcus aureus NEGATIVE NEGATIVE Final    Comment: (NOTE) The Xpert SA Assay (FDA approved for NASAL specimens in patients 42 years of age and older), is one component of a comprehensive surveillance program. It is not intended to diagnose infection nor to guide or monitor treatment. Performed at [facility], [place] [address], [place], [place] [address]   SARS Coronavirus 2 by RT PCR (hospital order, performed in [facility] hospital lab) Nasopharyngeal Nasopharyngeal Swab     Status: None   Collection Time: 02/26/20 11:20 AM   Specimen: Nasopharyngeal Swab  Result Value Ref Range Status   SARS Coronavirus 2 NEGATIVE NEGATIVE Final    Comment: (NOTE) SARS-CoV-2 target nucleic acids are NOT DETECTED.  The SARS-CoV-2 RNA is generally detectable in upper and lower respiratory specimens during the acute phase of infection. The lowest concentration of SARS-CoV-2 viral copies this assay can detect is 250 copies / mL. A negative result does not preclude SARS-CoV-2 infection and should not be  used as the sole basis for treatment or other patient management decisions.  A negative result may occur with improper specimen collection / handling, submission of specimen other than nasopharyngeal swab, presence of viral mutation(s) within the areas targeted by this assay, and inadequate number of viral copies (<250 copies / mL). A negative result must be combined with clinical observations, patient history, and epidemiological information.  Fact Sheet for Patients:   StrictlyIdeas.no  Fact Sheet for Healthcare Providers: BankingDealers.co.za  This test is not yet approved or  cleared by the [place] FDA and has been authorized for detection and/or diagnosis of SARS-CoV-2 by FDA under an Emergency Use Authorization (EUA).  This EUA will remain in effect (meaning this test can be used) for the duration of the COVID-19 declaration under Section 564(b)(1) of the Act, 21 U.S.C. section 360bbb-3(b)(1), unless the authorization is terminated or revoked sooner.  Performed at [facility], [place] [address] [place], [place] [address]          Radiology Studies: No results found.      Scheduled Meds:  budesonide  0.25 mg Nebulization BID   buPROPion  300 mg Oral Daily   calcium-vitamin D  1 tablet Oral Daily   Chlorhexidine Gluconate Cloth  6 each Topical Daily   docusate sodium  100 mg Oral BID   enoxaparin (LOVENOX) injection  40 mg Subcutaneous Q24H   famotidine  20 mg Oral QHS   FLUoxetine  20 mg Oral BID   folic acid  1 mg Oral Daily   levothyroxine  112 mcg Oral QAC breakfast   multivitamin with minerals  1 tablet Oral Daily   oxcarbazepine  600 mg Oral Daily   oxyCODONE  10 mg Oral BID   sucralfate  1 g Oral TID WC & HS   thiamine  100 mg Oral Daily   Or   thiamine  100 mg Intravenous Daily   Continuous Infusions:  sodium chloride 100 mL/hr at 02/27/20 0625   potassium  PHOSPHATE IVPB (in mmol) 40 mmol (02/27/20 1442)     LOS: 4 days    Time spent:0 min    Rosabella Edgin, Geraldo Docker, MD [facility] Pager [phone]  If 7PM-7AM, please  contact night-coverage [email or web address] Password [facility] 02/27/2020, 8:29 PM

## 2020-02-28 LAB — CBC WITH DIFFERENTIAL/PLATELET
Abs Immature Granulocytes: 0.07 10*3/uL (ref 0.00–0.07)
Basophils Absolute: 0 10*3/uL (ref 0.0–0.1)
Basophils Relative: 1 %
Eosinophils Absolute: 0.3 10*3/uL (ref 0.0–0.5)
Eosinophils Relative: 3 %
HCT: 28.4 % — ABNORMAL LOW (ref 36.0–46.0)
Hemoglobin: 8.6 g/dL — ABNORMAL LOW (ref 12.0–15.0)
Immature Granulocytes: 1 %
Lymphocytes Relative: 17 %
Lymphs Abs: 1.3 10*3/uL (ref 0.7–4.0)
MCH: 24.9 pg — ABNORMAL LOW (ref 26.0–34.0)
MCHC: 30.3 g/dL (ref 30.0–36.0)
MCV: 82.3 fL (ref 80.0–100.0)
Monocytes Absolute: 1 10*3/uL (ref 0.1–1.0)
Monocytes Relative: 13 %
Neutro Abs: 5.2 10*3/uL (ref 1.7–7.7)
Neutrophils Relative %: 65 %
Platelets: 289 10*3/uL (ref 150–400)
RBC: 3.45 MIL/uL — ABNORMAL LOW (ref 3.87–5.11)
RDW: 14.9 % (ref 11.5–15.5)
WBC: 8 10*3/uL (ref 4.0–10.5)
nRBC: 0 % (ref 0.0–0.2)

## 2020-02-28 LAB — COMPREHENSIVE METABOLIC PANEL
ALT: 23 U/L (ref 0–44)
AST: 29 U/L (ref 15–41)
Albumin: 2.9 g/dL — ABNORMAL LOW (ref 3.5–5.0)
Alkaline Phosphatase: 51 U/L (ref 38–126)
Anion gap: 11 (ref 5–15)
BUN: 7 mg/dL — ABNORMAL LOW (ref 8–23)
CO2: 25 mmol/L (ref 22–32)
Calcium: 8.9 mg/dL (ref 8.9–10.3)
Chloride: 100 mmol/L (ref 98–111)
Creatinine, Ser: 0.44 mg/dL (ref 0.44–1.00)
GFR, Estimated: >60 mL/min (ref >60)
Glucose, Bld: 101 mg/dL — ABNORMAL HIGH (ref 70–99)
Potassium: 3 mmol/L — ABNORMAL LOW (ref 3.5–5.1)
Sodium: 136 mmol/L (ref 135–145)
Total Bilirubin: 0.4 mg/dL (ref 0.3–1.2)
Total Protein: 5.9 g/dL — ABNORMAL LOW (ref 6.5–8.1)

## 2020-02-28 LAB — MAGNESIUM: Magnesium: 2 mg/dL (ref 1.7–2.4)

## 2020-02-28 LAB — PHOSPHORUS: Phosphorus: 2.8 mg/dL (ref 2.5–4.6)

## 2020-02-28 MED ORDER — POTASSIUM CHLORIDE 10 MEQ/100ML IV SOLN
10.0000 meq | INTRAVENOUS | Status: AC
  Administered 2020-02-28 (×5): 10 meq via INTRAVENOUS
  Filled 2020-02-28 (×5): qty 100

## 2020-02-28 MED ORDER — POTASSIUM CHLORIDE 10 MEQ/100ML IV SOLN: 10.0000 meq | INTRAVENOUS | Status: DC

## 2020-02-28 NOTE — Progress Notes (Signed)
0235 IV site to left forearm found to be infiltrated, site discontinued and catheter intact upon removal, warm moist compress applied and site elevated above heart on pillow, infiltrated fluid noted as 0.9% saline. New IV site started as documented.
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
## 2020-02-29 LAB — CBC WITH DIFFERENTIAL/PLATELET
Abs Immature Granulocytes: 0.15 10*3/uL — ABNORMAL HIGH (ref 0.00–0.07)
Basophils Absolute: 0.1 10*3/uL (ref 0.0–0.1)
Basophils Relative: 1 %
Eosinophils Absolute: 0.5 10*3/uL (ref 0.0–0.5)
Eosinophils Relative: 6 %
HCT: 31.7 % — ABNORMAL LOW (ref 36.0–46.0)
Hemoglobin: 9.6 g/dL — ABNORMAL LOW (ref 12.0–15.0)
Immature Granulocytes: 2 %
Lymphocytes Relative: 14 %
Lymphs Abs: 1.3 10*3/uL (ref 0.7–4.0)
MCH: 25 pg — ABNORMAL LOW (ref 26.0–34.0)
MCHC: 30.3 g/dL (ref 30.0–36.0)
MCV: 82.6 fL (ref 80.0–100.0)
Monocytes Absolute: 1 10*3/uL (ref 0.1–1.0)
Monocytes Relative: 11 %
Neutro Abs: 6.1 10*3/uL (ref 1.7–7.7)
Neutrophils Relative %: 66 %
Platelets: 399 10*3/uL (ref 150–400)
RBC: 3.84 MIL/uL — ABNORMAL LOW (ref 3.87–5.11)
RDW: 15 % (ref 11.5–15.5)
WBC: 9.1 10*3/uL (ref 4.0–10.5)
nRBC: 0 % (ref 0.0–0.2)

## 2020-02-29 LAB — COMPREHENSIVE METABOLIC PANEL
ALT: 30 U/L (ref 0–44)
AST: 31 U/L (ref 15–41)
Albumin: 3.3 g/dL — ABNORMAL LOW (ref 3.5–5.0)
Alkaline Phosphatase: 54 U/L (ref 38–126)
Anion gap: 10 (ref 5–15)
BUN: 8 mg/dL (ref 8–23)
CO2: 25 mmol/L (ref 22–32)
Calcium: 9.3 mg/dL (ref 8.9–10.3)
Chloride: 102 mmol/L (ref 98–111)
Creatinine, Ser: 0.48 mg/dL (ref 0.44–1.00)
GFR, Estimated: >60 mL/min (ref >60)
Glucose, Bld: 104 mg/dL — ABNORMAL HIGH (ref 70–99)
Potassium: 3.4 mmol/L — ABNORMAL LOW (ref 3.5–5.1)
Sodium: 137 mmol/L (ref 135–145)
Total Bilirubin: 0.5 mg/dL (ref 0.3–1.2)
Total Protein: 6.6 g/dL (ref 6.5–8.1)

## 2020-02-29 LAB — MAGNESIUM: Magnesium: 1.8 mg/dL (ref 1.7–2.4)

## 2020-02-29 LAB — PHOSPHORUS: Phosphorus: 2.2 mg/dL — ABNORMAL LOW (ref 2.5–4.6)

## 2020-02-29 MED ORDER — QUETIAPINE FUMARATE 100 MG PO TABS
100.0000 mg | ORAL_TABLET | Freq: Every evening | ORAL | Status: DC | PRN
  Administered 2020-02-29 – 2020-03-02 (×3): 100 mg via ORAL
  Filled 2020-02-29 (×3): qty 1

## 2020-02-29 MED ORDER — POTASSIUM PHOSPHATES 15 MMOLE/5ML IV SOLN
30.0000 mmol | Freq: Once | INTRAVENOUS | Status: AC
  Administered 2020-02-29: 30 mmol via INTRAVENOUS
  Filled 2020-02-29: qty 10

## 2020-02-29 NOTE — Care Management Important Message (Signed)
Important Message  Patient Details IM Letter given to the Patient. Name: Victoria Levine MRN: [id number] Date of Birth: 11-19-1950   Medicare Important Message Given:  Yes     Kerin Salen 02/29/2020, 12:43 PM

## 2020-02-29 NOTE — Progress Notes (Signed)
   02/29/20 0530  CIWA-Ar  Nausea and Vomiting 0  Tactile Disturbances 0  Tremor 1  Auditory Disturbances 0  Paroxysmal Sweats 0  Visual Disturbances 0  Anxiety 6  Headache, Fullness in Head 0  Agitation 3  Orientation and Clouding of Sensorium 2  CIWA-Ar Total 12  Pt making attempts to get out of bed. Both legs on outside of side rail.  Pt redirected although difficult.  Pt became agitated with direction. Pt with anxiety; pt talking but incomprehensible words are noted. There are brief moments of lucidity but overall pt has significant periods of confusion overall.

## 2020-02-29 NOTE — Progress Notes (Addendum)
   02/29/20 0344  CIWA-Ar  BP (!) 163/84  Nausea and Vomiting 0  Tactile Disturbances 0  Tremor 0  Auditory Disturbances 1  Paroxysmal Sweats 0  Visual Disturbances 0  Anxiety 6  Headache, Fullness in Head 0  Agitation 4  Orientation and Clouding of Sensorium 2  CIWA-Ar Total 13  Pt agitated and has made attempt to get out of bed.  Pt states there is a phone ringing.  Redirecting pt was not effective.  Medicated per md order.

## 2020-02-29 NOTE — Progress Notes (Signed)
   02/29/20 0630  CIWA-Ar  Nausea and Vomiting 0  Tactile Disturbances 0  Tremor 0  Auditory Disturbances 0  Paroxysmal Sweats 0  Visual Disturbances 0  Anxiety 5  Headache, Fullness in Head 0  Agitation 2  Orientation and Clouding of Sensorium 2  CIWA-Ar Total 9  Pt remains attempting to get out of bed.  Unknown why.  Pt with anxiety and agitation  noted.  Pt is easily redirected although there is still some confusion.

## 2020-02-29 NOTE — Progress Notes (Signed)
Physical Therapy Treatment Patient Details Name: Victoria Levine MRN: [id number] DOB: [birth year] Today's Date: 02/29/2020    History of Present Illness Pt s/p fall with L hip fx and now s/p L THR by ant direct approach.  Pt with hx of comp fx, kyphoplasty, lumbar fusion and bipolar.    PT Comments    POD # 5 Pt progressing slowly due to pain and AMS.  Assisted OOB to [facility] only.  Pt required Max/Toal Assist and was only able to briefly stand.  Pt lives home alone and will need ST Rehab at [facility].   Follow Up Recommendations  SNF     Equipment Recommendations  None recommended by PT    Recommendations for Other Services       Precautions / Restrictions Precautions Precautions: Fall Restrictions Weight Bearing Restrictions: No LLE Weight Bearing: Weight bearing as tolerated    Mobility  Bed Mobility Overal bed mobility: Needs Assistance Bed Mobility: Supine to Sit;Sit to Supine     Supine to sit: Max assist Sit to supine: Max assist;Total assist   General bed mobility comments: Increased time with cues for sequence and use of R LE to self assist  Transfers Overall transfer level: Needs assistance Equipment used: Rolling walker (2 wheeled);None Transfers: Sit to/from [facility] Sit to Stand: Mod assist;+2 physical assistance;+2 safety/equipment;From elevated surface Stand pivot transfers: Max assist;+2 physical assistance;+2 safety/equipment;From elevated surface       General transfer comment: assisted from bed to [name] and back to bed  Ambulation/Gait             General Gait Details: Unable to attempt due to poor transfer level   Stairs             Wheelchair Mobility    Modified Rankin (Stroke Patients Only)       Balance                                            Cognition Arousal/Alertness: Awake/alert Behavior During Therapy: Anxious;Impulsive Overall Cognitive Status: No family/caregiver present  to determine baseline cognitive functioning                                 General Comments: AxO x 1 following functional commands.  Crying      Exercises      General Comments        Pertinent Vitals/Pain Pain Assessment: Faces Faces Pain Scale: Hurts even more Pain Location: L hip with mobility Pain Descriptors / Indicators: Aching;Sore;Crying Pain Intervention(s): Monitored during session;Premedicated before session;Repositioned    Home Living                      Prior Function            PT Goals (current goals can now be found in the care plan section) Progress towards PT goals: Progressing toward goals    Frequency    Min 3X/week      PT Plan Current plan remains appropriate    Co-evaluation              AM-PAC PT "6 Clicks" Mobility   Outcome Measure  Help needed turning from your back to your side while in a flat bed without using bedrails?: A Lot Help needed moving from lying  on your back to sitting on the side of a flat bed without using bedrails?: A Lot Help needed moving to and from a bed to a chair (including a wheelchair)?: A Lot Help needed standing up from a chair using your arms (e.g., wheelchair or bedside chair)?: A Lot Help needed to walk in hospital room?: Total Help needed climbing 3-5 steps with a railing? : Total 6 Click Score: 10    End of Session Equipment Utilized During Treatment: Gait belt Activity Tolerance: Patient limited by fatigue;No increased pain Patient left: in bed;with call bell/phone within reach;with bed alarm set Nurse Communication: Mobility status PT Visit Diagnosis: Difficulty in walking, not elsewhere classified (R26.2);History of falling (Z91.81)     Time: 7262-0355 PT Time Calculation (min) (ACUTE ONLY): 24 min  Charges:  $Therapeutic Activity: 23-37 mins                     {Idaly Verret  PTA Acute  Rehabilitation Services Pager      [phone] Office       [phone]

## 2020-02-29 NOTE — Progress Notes (Signed)
PROGRESS NOTE    Victoria Levine  JHE:[id number] DOB: 1951/01/18 DOA: 02/23/2020 PCP: [name], MD     Brief Narrative:  Victoria Levine is a 69 y.o. WF PMHx Suicide attempt, EtOH abuse, Bipolar disorder, Herpes, anxiety DO, degenerative disc disease, GERD, asthma, hypothyroidism, osteoporosis   who had a mechanical fall at home apparently.  Came to the ER complaining of left hip pain.  She fell on her left side.  The pain is 9 out of 10.  Worse on the left side.  Radiating down her leg.  Denied any other site of pain.  Denied any fever or chills denied any nausea vomiting or diarrhea.  Patient was seen and evaluated.  She appears to have left displaced intertrochanteric femur fracture.  Orthopedics consulted and patient being admitted to the hospital for possible repair..  ED Course: Temperature 98.8 blood pressure 204/117, pulse 93 respiratory rate 22 oxygen sats 91% room air.  White count 14.4 hemoglobin 12.8 platelets 362.  Sodium 133 potassium 3.2 chloride 97 CO2 21.  COVID-19 screen is negative.  X-ray of the left ankle chest x-ray knee were all negative x-ray of the left hip shows displaced femoral neck fracture.  Orthopedics consulted and patient being admitted to the hospital for further evaluation and treatment.  Review of Systems: As per HPI otherwise 10 point review of systems negative.    Subjective: 12/13 afebrile overnight A/O x0 (does knowt She is in hospital), does not appear to be hallucinating today.  However is not following all commands    Assessment & Plan: Covid vaccination;   Principal Problem:   Displaced intertrochanteric fracture of left femur, initial encounter for closed fracture [facility]) Active Problems:   Hypothyroidism   HYPERCHOLESTEROLEMIA   Bipolar disorder ([place])   Asthma-chronic obstructive pulmonary disease overlap syndrome ([place])   GERD (gastroesophageal reflux disease)   Osteoporosis   Alcohol abuse   Hypokalemia   Acute  metabolic encephalopathy   Displaced intertrochanteric fracture of the LEFT femur:  -S/p Left total hip arthroplasty -12/10 per orthopedic surgery  WBAT with walker  Osteoporosis -Continue home meds  Acute metabolic encephalopathy -Alcohol withdrawal?  Overmedication with pain medication? -See EtOH  -12/11 discontinue Dilaudid, OxyIR, gabapentin. -12/11 will only continue OxyContin 10 mg BID -Hopefully this will help clear patient's encephalopathy -12/12 encephalopathy improved however patient still having hallucinations. -Out of bed to chair every shift, -All lights on in room during the daytime -All shades open during daytime -Ambulate patient every shift -12/13 decrease Seroquel 100 mg PRN  EtOH -Last drink per patient 2 days ago -CIWA protocol  Bipolar DO -Wellbutrin 300 mg daily -Fluoxetine 20 mg BID -Seroquel 200 mg daily PRN  Hypothyroidism -Synthroid 112 mcg  Asthma/COPD -Continue home meds  HLD -Lipid panel pending  Hypokalemia -Potassium goal> 4 -12/13 K-Phos IV 30 mmol  Hypophosphatemia -Phosphorus goal> 2.5 -See hypokalemia  Hypomagnesmia -Magnesium goal> 2     DVT prophylaxis: Lovenox Code Status: Full Family Communication:     Dispo: The patient is from: Home              Anticipated d/c is to: SNF?              Anticipated d/c date is: Per surgery              Patient currently unstable      Consultants:  Orthopedic surgery   procedures/Significant Events:  12/8 Left total hip arthroplasty, anterior approach    I have personally  reviewed and interpreted all radiology studies and my findings are as above.  VENTILATOR SETTINGS:    Cultures 12/10 SARS coronavirus negative 12/10 influenza A/B negative 12/10 SARS coronavirus negative  Antimicrobials: Anti-infectives (From admission, onward)   Start     Ordered Stop   02/24/20 2030  ceFAZolin (ANCEF) IVPB 2g/100 mL premix        02/24/20 1801 02/25/20 0254    02/24/20 1400  ceFAZolin (ANCEF) IVPB 2g/100 mL premix        02/24/20 1343 02/24/20 1502       Devices    LINES / TUBES:      Continuous Infusions: . sodium chloride 100 mL/hr at 02/29/20 0540     Objective: Vitals:   02/28/20 2032 02/29/20 0339 02/29/20 0344 02/29/20 0700  BP: (!) 133/96 (!) 163/84 (!) 163/84   Pulse: 95 89    Resp: 20     Temp: 98.8 F (37.1 C) 98 F (36.7 C)    TempSrc: Oral Oral    SpO2: 96% 95%  100%  Weight:      Height:        Intake/Output Summary (Last 24 hours) at 02/29/2020 1118 Last data filed at 02/29/2020 0930 Gross per 24 hour  Intake 2485.54 ml  Output 1250 ml  Net 1235.54 ml   Filed Weights   02/23/20 1701  Weight: 76.2 kg   Physical Exam:  General: A/O x0 (does not know she is in a hospital), somnolent but arousable.  No acute respiratory distress Eyes: negative scleral hemorrhage, negative anisocoria, negative icterus ENT: Negative Runny nose, negative gingival bleeding, Neck:  Negative scars, masses, torticollis, lymphadenopathy, JVD Lungs: Clear to auscultation bilaterally without wheezes or crackles Cardiovascular: Regular rate and rhythm without murmur gallop or rub normal S1 and S2 Abdomen: negative abdominal pain, nondistended, positive soft, bowel sounds, no rebound, no ascites, no appreciable mass Extremities: left thigh surgical dressing replaced overnight as requested. Skin: Negative rashes, lesions, ulcers Psychiatric:  Negative depression, negative anxiety, negative fatigue, negative mania  Central nervous system:  Cranial nerves II through XII intact, tongue/uvula midline, all extremities muscle strength 5/5, sensation intact throughout, negative dysarthria, negative expressive aphasia, negative receptive aphasia.     Data Reviewed: Care during the described time interval was provided by me .  I have reviewed this patient's available data, including medical history, events of note, physical examination,  and all test results as part of my evaluation.  CBC: Recent Labs  Lab 02/25/20 0333 02/26/20 0350 02/27/20 0332 02/27/20 1125 02/28/20 0447 02/29/20 0822  WBC 11.4* 10.5 8.5 10.3 8.0 9.1  NEUTROABS 8.5* 8.3* 6.2  --  5.2 6.1  HGB 9.8* 8.8* 8.1* 8.7* 8.6* 9.6*  HCT 34.8* 29.5* 26.7* 27.9* 28.4* 31.7*  MCV 89.0 86.5 83.2 82.3 82.3 82.6  PLT 237 223 248 288 289 947   Basic Metabolic Panel: Recent Labs  Lab 02/26/20 0350 02/27/20 0332 02/27/20 1125 02/28/20 0447 02/29/20 0822  NA 136 139 136 136 137  K 4.2 3.3* 3.2* 3.0* 3.4*  CL 105 103 101 100 102  CO2 23 27 26 25 25   GLUCOSE 150* 106* 135* 101* 104*  BUN 13 7* 7* 7* 8  CREATININE 0.68 0.54 0.49 0.44 0.48  CALCIUM 8.5* 9.0 9.0 8.9 9.3  MG 2.1 1.8 1.9 2.0 1.8  PHOS 1.6* 1.2* 1.6* 2.8 2.2*   GFR: Estimated Creatinine Clearance: 69.6 mL/min (by C-G formula based on SCr of 0.48 mg/dL). Liver Function Tests: Recent Labs  Lab  02/26/20 0350 02/27/20 0332 02/27/20 1125 02/28/20 0447 02/29/20 0822  AST 28 31 35 29 31  ALT 16 20 22 23 30   ALKPHOS 51 51 55 51 54  BILITOT 0.4 0.3 0.5 0.4 0.5  PROT 5.8* 6.0* 6.4* 5.9* 6.6  ALBUMIN 2.9* 3.0* 3.4* 2.9* 3.3*   No results for input(s): LIPASE, AMYLASE in the last 168 hours. No results for input(s): AMMONIA in the last 168 hours. Coagulation Profile: Recent Labs  Lab 02/23/20 1702  INR 1.1   Cardiac Enzymes: Recent Labs  Lab 02/23/20 1702  CKTOTAL 373*   BNP (last 3 results) No results for input(s): PROBNP in the last 8760 hours. HbA1C: No results for input(s): HGBA1C in the last 72 hours. CBG: Recent Labs  Lab 02/23/20 1653  GLUCAP 195*   Lipid Profile: No results for input(s): CHOL, HDL, LDLCALC, TRIG, CHOLHDL, LDLDIRECT in the last 72 hours. Thyroid Function Tests: No results for input(s): TSH, T4TOTAL, FREET4, T3FREE, THYROIDAB in the last 72 hours. Anemia Panel: No results for input(s): VITAMINB12, FOLATE, FERRITIN, TIBC, IRON, RETICCTPCT in the  last 72 hours. Sepsis Labs: No results for input(s): PROCALCITON, LATICACIDVEN in the last 168 hours.  Recent Results (from the past 240 hour(s))  Resp Panel by RT-PCR (Flu A&B, Covid) Nasopharyngeal Swab     Status: None   Collection Time: 02/23/20  6:48 PM   Specimen: Nasopharyngeal Swab; Nasopharyngeal(NP) swabs in vial transport medium  Result Value Ref Range Status   SARS Coronavirus 2 by RT PCR NEGATIVE NEGATIVE Final    Comment: (NOTE) SARS-CoV-2 target nucleic acids are NOT DETECTED.  The SARS-CoV-2 RNA is generally detectable in upper respiratory specimens during the acute phase of infection. The lowest concentration of SARS-CoV-2 viral copies this assay can detect is 138 copies/mL. A negative result does not preclude SARS-Cov-2 infection and should not be used as the sole basis for treatment or other patient management decisions. A negative result may occur with  improper specimen collection/handling, submission of specimen other than nasopharyngeal swab, presence of viral mutation(s) within the areas targeted by this assay, and inadequate number of viral copies(<138 copies/mL). A negative result must be combined with clinical observations, patient history, and epidemiological information. The expected result is Negative.  Fact Sheet for Patients:  EntrepreneurPulse.com.au  Fact Sheet for Healthcare Providers:  IncredibleEmployment.be  This test is no t yet approved or cleared by the [place] FDA and  has been authorized for detection and/or diagnosis of SARS-CoV-2 by FDA under an Emergency Use Authorization (EUA). This EUA will remain  in effect (meaning this test can be used) for the duration of the COVID-19 declaration under Section 564(b)(1) of the Act, 21 U.S.C.section 360bbb-3(b)(1), unless the authorization is terminated  or revoked sooner.       Influenza A by PCR NEGATIVE NEGATIVE Final   Influenza B by PCR NEGATIVE  NEGATIVE Final    Comment: (NOTE) The Xpert Xpress SARS-CoV-2/FLU/RSV plus assay is intended as an aid in the diagnosis of influenza from Nasopharyngeal swab specimens and should not be used as a sole basis for treatment. Nasal washings and aspirates are unacceptable for Xpert Xpress SARS-CoV-2/FLU/RSV testing.  Fact Sheet for Patients: EntrepreneurPulse.com.au  Fact Sheet for Healthcare Providers: IncredibleEmployment.be  This test is not yet approved or cleared by the [place] FDA and has been authorized for detection and/or diagnosis of SARS-CoV-2 by FDA under an Emergency Use Authorization (EUA). This EUA will remain in effect (meaning this test can be used) for the  duration of the COVID-19 declaration under Section 564(b)(1) of the Act, 21 U.S.C. section 360bbb-3(b)(1), unless the authorization is terminated or revoked.  Performed at [facility], [place] [address] [place], [place] [address]   Surgical pcr screen     Status: None   Collection Time: 02/23/20 11:49 PM   Specimen: Nasal Mucosa; Nasal Swab  Result Value Ref Range Status   MRSA, PCR NEGATIVE NEGATIVE Final   Staphylococcus aureus NEGATIVE NEGATIVE Final    Comment: (NOTE) The Xpert SA Assay (FDA approved for NASAL specimens in patients 72 years of age and older), is one component of a comprehensive surveillance program. It is not intended to diagnose infection nor to guide or monitor treatment. Performed at [facility], [place] [address], [place], [place] [address]   SARS Coronavirus 2 by RT PCR (hospital order, performed in [facility] hospital lab) Nasopharyngeal Nasopharyngeal Swab     Status: None   Collection Time: 02/26/20 11:20 AM   Specimen: Nasopharyngeal Swab  Result Value Ref Range Status   SARS Coronavirus 2 NEGATIVE NEGATIVE Final    Comment: (NOTE) SARS-CoV-2 target nucleic acids are NOT DETECTED.  The SARS-CoV-2 RNA  is generally detectable in upper and lower respiratory specimens during the acute phase of infection. The lowest concentration of SARS-CoV-2 viral copies this assay can detect is 250 copies / mL. A negative result does not preclude SARS-CoV-2 infection and should not be used as the sole basis for treatment or other patient management decisions.  A negative result may occur with improper specimen collection / handling, submission of specimen other than nasopharyngeal swab, presence of viral mutation(s) within the areas targeted by this assay, and inadequate number of viral copies (<250 copies / mL). A negative result must be combined with clinical observations, patient history, and epidemiological information.  Fact Sheet for Patients:   StrictlyIdeas.no  Fact Sheet for Healthcare Providers: BankingDealers.co.za  This test is not yet approved or  cleared by the [place] FDA and has been authorized for detection and/or diagnosis of SARS-CoV-2 by FDA under an Emergency Use Authorization (EUA).  This EUA will remain in effect (meaning this test can be used) for the duration of the COVID-19 declaration under Section 564(b)(1) of the Act, 21 U.S.C. section 360bbb-3(b)(1), unless the authorization is terminated or revoked sooner.  Performed at [facility], [place] [address], [place], [place] [address]          Radiology Studies: No results found.      Scheduled Meds: . budesonide  0.25 mg Nebulization BID  . buPROPion  300 mg Oral Daily  . calcium-vitamin D  1 tablet Oral Daily  . docusate sodium  100 mg Oral BID  . enoxaparin (LOVENOX) injection  40 mg Subcutaneous Q24H  . famotidine  20 mg Oral QHS  . FLUoxetine  20 mg Oral BID  . folic acid  1 mg Oral Daily  . levothyroxine  112 mcg Oral QAC breakfast  . multivitamin with minerals  1 tablet Oral Daily  . oxcarbazepine  600 mg Oral Daily  . oxyCODONE   10 mg Oral BID  . sucralfate  1 g Oral TID WC & HS  . thiamine  100 mg Oral Daily   Or  . thiamine  100 mg Intravenous Daily   Continuous Infusions: . sodium chloride 100 mL/hr at 02/29/20 0540     LOS: 6 days    Time spent:0 min    Yanci Bachtell J,  MD [facility] Pager [phone]  If 7PM-7AM, please contact night-coverage [email or web address] Password [email or web address] 02/29/2020, 11:18 AM

## 2020-03-01 LAB — PHOSPHORUS: Phosphorus: 3.2 mg/dL (ref 2.5–4.6)

## 2020-03-01 LAB — CBC WITH DIFFERENTIAL/PLATELET
Abs Immature Granulocytes: 0.14 10*3/uL — ABNORMAL HIGH (ref 0.00–0.07)
Basophils Absolute: 0.1 10*3/uL (ref 0.0–0.1)
Basophils Relative: 1 %
Eosinophils Absolute: 0.7 10*3/uL — ABNORMAL HIGH (ref 0.0–0.5)
Eosinophils Relative: 8 %
HCT: 27.2 % — ABNORMAL LOW (ref 36.0–46.0)
Hemoglobin: 8.4 g/dL — ABNORMAL LOW (ref 12.0–15.0)
Immature Granulocytes: 2 %
Lymphocytes Relative: 21 %
Lymphs Abs: 1.8 10*3/uL (ref 0.7–4.0)
MCH: 25.6 pg — ABNORMAL LOW (ref 26.0–34.0)
MCHC: 30.9 g/dL (ref 30.0–36.0)
MCV: 82.9 fL (ref 80.0–100.0)
Monocytes Absolute: 0.9 10*3/uL (ref 0.1–1.0)
Monocytes Relative: 11 %
Neutro Abs: 4.8 10*3/uL (ref 1.7–7.7)
Neutrophils Relative %: 57 %
Platelets: 400 10*3/uL (ref 150–400)
RBC: 3.28 MIL/uL — ABNORMAL LOW (ref 3.87–5.11)
RDW: 15 % (ref 11.5–15.5)
WBC: 8.3 10*3/uL (ref 4.0–10.5)
nRBC: 0 % (ref 0.0–0.2)

## 2020-03-01 LAB — COMPREHENSIVE METABOLIC PANEL
ALT: 26 U/L (ref 0–44)
AST: 22 U/L (ref 15–41)
Albumin: 2.8 g/dL — ABNORMAL LOW (ref 3.5–5.0)
Alkaline Phosphatase: 44 U/L (ref 38–126)
Anion gap: 10 (ref 5–15)
BUN: 9 mg/dL (ref 8–23)
CO2: 23 mmol/L (ref 22–32)
Calcium: 8.8 mg/dL — ABNORMAL LOW (ref 8.9–10.3)
Chloride: 105 mmol/L (ref 98–111)
Creatinine, Ser: 0.57 mg/dL (ref 0.44–1.00)
GFR, Estimated: >60 mL/min (ref >60)
Glucose, Bld: 102 mg/dL — ABNORMAL HIGH (ref 70–99)
Potassium: 3.1 mmol/L — ABNORMAL LOW (ref 3.5–5.1)
Sodium: 138 mmol/L (ref 135–145)
Total Bilirubin: 0.3 mg/dL (ref 0.3–1.2)
Total Protein: 5.4 g/dL — ABNORMAL LOW (ref 6.5–8.1)

## 2020-03-01 LAB — MAGNESIUM: Magnesium: 1.6 mg/dL — ABNORMAL LOW (ref 1.7–2.4)

## 2020-03-01 MED ORDER — POTASSIUM CHLORIDE CRYS ER 20 MEQ PO TBCR
50.0000 meq | EXTENDED_RELEASE_TABLET | Freq: Two times a day (BID) | ORAL | Status: AC
  Administered 2020-03-01 (×2): 50 meq via ORAL
  Filled 2020-03-01 (×2): qty 1

## 2020-03-01 MED ORDER — FENTANYL CITRATE (PF) 100 MCG/2ML IJ SOLN: 12.5000 ug | Freq: Once | INTRAMUSCULAR | Status: DC

## 2020-03-01 MED ORDER — MAGNESIUM SULFATE 2 GM/50ML IV SOLN
2.0000 g | Freq: Once | INTRAVENOUS | Status: AC
  Administered 2020-03-01: 2 g via INTRAVENOUS
  Filled 2020-03-01: qty 50

## 2020-03-01 NOTE — TOC Progression Note (Signed)
Transition of Care [facility]) - Progression Note    Patient Details  Name: Victoria Levine MRN: [id number] Date of Birth: [birth year]  [facility]) CM/SW Contact  Purcell Mouton, RN Phone Number: 03/01/2020, 2:54 PM  Clinical Narrative:     Reference # for insurance Josem Kaufmann [id number], 12/15-12/19/21. Pt selected [facility].    Expected Discharge Plan: [place] Barriers to Discharge: Continued Medical Work up,Insurance [place] (PASRR)  Expected Discharge Plan and Services Expected Discharge Plan: [place] In-house Referral: Clinical Social Work Discharge Planning Services: CM Consult Post Acute Care Choice: [place] arrangements for the past 2 months: [place] Determinants of Health (SDOH) Interventions    Readmission Risk Interventions Readmission Risk Prevention Plan 02/25/2020  Transportation Screening Complete  Medication Review [employer]) Complete  PCP or Specialist appointment within 3-5 days of discharge Complete  SW Recovery Care/Counseling Consult Complete  Skilled Nursing Facility Complete  Some recent data might be hidden

## 2020-03-01 NOTE — TOC Progression Note (Signed)
Transition of Care [facility]) - Progression Note    Patient Details  Name: Victoria Levine MRN: [id number] Date of Birth: 1951-01-14  Transition of Care [facility]) CM/SW Contact  Purcell Mouton, RN Phone Number: 03/01/2020, 10:17 AM  Clinical Narrative:    [name] #[id number] f EXPIRE 03/30/20. Pt will need insurance authorization before going to skilled.   Expected Discharge Plan: [place] Barriers to Discharge: Continued Medical Work up,Insurance [place] (PASRR)  Expected Discharge Plan and Services Expected Discharge Plan: [place] In-house Referral: Clinical Social Work Discharge Planning Services: CM Consult Post Acute Care Choice: [place] arrangements for the past 2 months: [place] Determinants of Health (SDOH) Interventions    Readmission Risk Interventions Readmission Risk Prevention Plan 02/25/2020  Transportation Screening Complete  Medication Review [employer]) Complete  PCP or Specialist appointment within 3-5 days of discharge Complete  SW Recovery Care/Counseling Consult Complete  Skilled Nursing Facility Complete  Some recent data might be hidden

## 2020-03-01 NOTE — Progress Notes (Signed)
PROGRESS NOTE    Victoria Levine  VQM:[id number] DOB: Dec 20, 1950 DOA: 02/23/2020 PCP: [name], MD     Brief Narrative:  Victoria Levine is a 69 y.o. WF PMHx Suicide attempt, EtOH abuse, Bipolar disorder, Herpes, anxiety DO, degenerative disc disease, GERD, asthma, hypothyroidism, osteoporosis   who had a mechanical fall at home apparently.  Came to the ER complaining of left hip pain.  She fell on her left side.  The pain is 9 out of 10.  Worse on the left side.  Radiating down her leg.  Denied any other site of pain.  Denied any fever or chills denied any nausea vomiting or diarrhea.  Patient was seen and evaluated.  She appears to have left displaced intertrochanteric femur fracture.  Orthopedics consulted and patient being admitted to the hospital for possible repair..  ED Course: Temperature 98.8 blood pressure 204/117, pulse 93 respiratory rate 22 oxygen sats 91% room air.  White count 14.4 hemoglobin 12.8 platelets 362.  Sodium 133 potassium 3.2 chloride 97 CO2 21.  COVID-19 screen is negative.  X-ray of the left ankle chest x-ray knee were all negative x-ray of the left hip shows displaced femoral neck fracture.  Orthopedics consulted and patient being admitted to the hospital for further evaluation and treatment.  Review of Systems: As per HPI otherwise 10 point review of systems negative.    Subjective: 12/14 A/O x4, patient still has wandering thoughts.  Does not appear to be hallucinating today.  Follows all commands.   Assessment & Plan: Covid vaccination;   Principal Problem:   Displaced intertrochanteric fracture of left femur, initial encounter for closed fracture [facility]) Active Problems:   Hypothyroidism   HYPERCHOLESTEROLEMIA   Bipolar disorder ([place])   Asthma-chronic obstructive pulmonary disease overlap syndrome (HCC)   GERD (gastroesophageal reflux disease)   Osteoporosis   Alcohol abuse   Hypokalemia   Acute metabolic  encephalopathy   Displaced intertrochanteric fracture of the LEFT femur:  -S/p Left total hip arthroplasty -12/10 per orthopedic surgery  WBAT with walker  Osteoporosis -Continue home meds  Acute metabolic encephalopathy -Alcohol withdrawal?  Overmedication with pain medication? -See EtOH  -12/11 discontinue Dilaudid, OxyIR, gabapentin. -12/11 will only continue OxyContin 10 mg BID -Hopefully this will help clear patient's encephalopathy -12/12 encephalopathy improved however patient still having hallucinations. -Out of bed to chair every shift, -All lights on in room during the daytime -All shades open during daytime -Ambulate patient every shift -12/13 decrease Seroquel 100 mg PRN  EtOH -Last drink per patient 2 days ago -CIWA protocol  Bipolar DO -Wellbutrin 300 mg daily -Fluoxetine 20 mg BID -Seroquel 200 mg daily PRN  Hypothyroidism -Synthroid 112 mcg  Asthma/COPD -Continue home meds  HLD -Lipid panel pending  Hypokalemia -Potassium goal> 4 -12/14 K-Dur 50 meq x2 doses  Hypophosphatemia -Phosphorus goal> 2.5   Hypomagnesmia -Magnesium goal> 2 - Magnesium IV  2g    DVT prophylaxis: Lovenox Code Status: Full Family Communication:     Dispo: The patient is from: Home              Anticipated d/c is to: SNF?              Anticipated d/c date is: Per surgery              Patient currently unstable      Consultants:  Orthopedic surgery   procedures/Significant Events:  12/8 Left total hip arthroplasty, anterior approach    I have personally reviewed and  interpreted all radiology studies and my findings are as above.  VENTILATOR SETTINGS:    Cultures 12/10 SARS coronavirus negative 12/10 influenza A/B negative 12/10 SARS coronavirus negative  Antimicrobials: Anti-infectives (From admission, onward)   Start     Ordered Stop   02/24/20 2030  ceFAZolin (ANCEF) IVPB 2g/100 mL premix        02/24/20 1801 02/25/20 0254    02/24/20 1400  ceFAZolin (ANCEF) IVPB 2g/100 mL premix        02/24/20 1343 02/24/20 1502       Devices    LINES / TUBES:      Continuous Infusions:  sodium chloride 100 mL/hr at 03/01/20 1336     Objective: Vitals:   02/29/20 2249 03/01/20 0415 03/01/20 0554 03/01/20 0848  BP:  (!) 166/95 (!) 166/95   Pulse:  74 74   Resp:  20    Temp:  99.8 F (37.7 C) 98.8 F (37.1 C)   TempSrc:  Oral Oral   SpO2: 96% 96% 96% 95%  Weight:      Height:        Intake/Output Summary (Last 24 hours) at 03/01/2020 1406 Last data filed at 02/29/2020 1822 Gross per 24 hour  Intake 2048.4 ml  Output 600 ml  Net 1448.4 ml   Filed Weights   02/23/20 1701  Weight: 76.2 kg   Physical Exam:  General: A/O x4 (does not know she is in a hospital),.  No acute respiratory distress Eyes: negative scleral hemorrhage, negative anisocoria, negative icterus ENT: Negative Runny nose, negative gingival bleeding, Neck:  Negative scars, masses, torticollis, lymphadenopathy, JVD Lungs: Clear to auscultation bilaterally without wheezes or crackles Cardiovascular: Regular rate and rhythm without murmur gallop or rub normal S1 and S2 Abdomen: negative abdominal pain, nondistended, positive soft, bowel sounds, no rebound, no ascites, no appreciable mass Extremities: left thigh surgical dressing replaced overnight as requested. Skin: Negative rashes, lesions, ulcers Psychiatric:  Negative depression, negative anxiety, negative fatigue, negative mania  Central nervous system:  Cranial nerves II through XII intact, tongue/uvula midline, all extremities muscle strength 5/5, sensation intact throughout, negative dysarthria, negative expressive aphasia, negative receptive aphasia.     Data Reviewed: Care during the described time interval was provided by me .  I have reviewed this patient's available data, including medical history, events of note, physical examination, and all test results as part of my  evaluation.  CBC: Recent Labs  Lab 02/26/20 0350 02/27/20 0332 02/27/20 1125 02/28/20 0447 02/29/20 0822 03/01/20 0419  WBC 10.5 8.5 10.3 8.0 9.1 8.3  NEUTROABS 8.3* 6.2  --  5.2 6.1 4.8  HGB 8.8* 8.1* 8.7* 8.6* 9.6* 8.4*  HCT 29.5* 26.7* 27.9* 28.4* 31.7* 27.2*  MCV 86.5 83.2 82.3 82.3 82.6 82.9  PLT 223 248 288 289 399 621   Basic Metabolic Panel: Recent Labs  Lab 02/27/20 0332 02/27/20 1125 02/28/20 0447 02/29/20 0822 03/01/20 0419  NA 139 136 136 137 138  K 3.3* 3.2* 3.0* 3.4* 3.1*  CL 103 101 100 102 105  CO2 27 26 25 25 23   GLUCOSE 106* 135* 101* 104* 102*  BUN 7* 7* 7* 8 9  CREATININE 0.54 0.49 0.44 0.48 0.57  CALCIUM 9.0 9.0 8.9 9.3 8.8*  MG 1.8 1.9 2.0 1.8 1.6*  PHOS 1.2* 1.6* 2.8 2.2* 3.2   GFR: Estimated Creatinine Clearance: 69.6 mL/min (by C-G formula based on SCr of 0.57 mg/dL). Liver Function Tests: Recent Labs  Lab 02/27/20 0332 02/27/20 1125 02/28/20 0447  02/29/20 0822 03/01/20 0419  AST 31 35 29 31 22   ALT 20 22 23 30 26   ALKPHOS 51 55 51 54 44  BILITOT 0.3 0.5 0.4 0.5 0.3  PROT 6.0* 6.4* 5.9* 6.6 5.4*  ALBUMIN 3.0* 3.4* 2.9* 3.3* 2.8*   No results for input(s): LIPASE, AMYLASE in the last 168 hours. No results for input(s): AMMONIA in the last 168 hours. Coagulation Profile: Recent Labs  Lab 02/23/20 1702  INR 1.1   Cardiac Enzymes: Recent Labs  Lab 02/23/20 1702  CKTOTAL 373*   BNP (last 3 results) No results for input(s): PROBNP in the last 8760 hours. HbA1C: No results for input(s): HGBA1C in the last 72 hours. CBG: Recent Labs  Lab 02/23/20 1653  GLUCAP 195*   Lipid Profile: No results for input(s): CHOL, HDL, LDLCALC, TRIG, CHOLHDL, LDLDIRECT in the last 72 hours. Thyroid Function Tests: No results for input(s): TSH, T4TOTAL, FREET4, T3FREE, THYROIDAB in the last 72 hours. Anemia Panel: No results for input(s): VITAMINB12, FOLATE, FERRITIN, TIBC, IRON, RETICCTPCT in the last 72 hours. Sepsis Labs: No results  for input(s): PROCALCITON, LATICACIDVEN in the last 168 hours.  Recent Results (from the past 240 hour(s))  Resp Panel by RT-PCR (Flu A&B, Covid) Nasopharyngeal Swab     Status: None   Collection Time: 02/23/20  6:48 PM   Specimen: Nasopharyngeal Swab; Nasopharyngeal(NP) swabs in vial transport medium  Result Value Ref Range Status   SARS Coronavirus 2 by RT PCR NEGATIVE NEGATIVE Final    Comment: (NOTE) SARS-CoV-2 target nucleic acids are NOT DETECTED.  The SARS-CoV-2 RNA is generally detectable in upper respiratory specimens during the acute phase of infection. The lowest concentration of SARS-CoV-2 viral copies this assay can detect is 138 copies/mL. A negative result does not preclude SARS-Cov-2 infection and should not be used as the sole basis for treatment or other patient management decisions. A negative result may occur with  improper specimen collection/handling, submission of specimen other than nasopharyngeal swab, presence of viral mutation(s) within the areas targeted by this assay, and inadequate number of viral copies(<138 copies/mL). A negative result must be combined with clinical observations, patient history, and epidemiological information. The expected result is Negative.  Fact Sheet for Patients:  EntrepreneurPulse.com.au  Fact Sheet for Healthcare Providers:  IncredibleEmployment.be  This test is no t yet approved or cleared by the [place] FDA and  has been authorized for detection and/or diagnosis of SARS-CoV-2 by FDA under an Emergency Use Authorization (EUA). This EUA will remain  in effect (meaning this test can be used) for the duration of the COVID-19 declaration under Section 564(b)(1) of the Act, 21 U.S.C.section 360bbb-3(b)(1), unless the authorization is terminated  or revoked sooner.       Influenza A by PCR NEGATIVE NEGATIVE Final   Influenza B by PCR NEGATIVE NEGATIVE Final    Comment: (NOTE) The  Xpert Xpress SARS-CoV-2/FLU/RSV plus assay is intended as an aid in the diagnosis of influenza from Nasopharyngeal swab specimens and should not be used as a sole basis for treatment. Nasal washings and aspirates are unacceptable for Xpert Xpress SARS-CoV-2/FLU/RSV testing.  Fact Sheet for Patients: EntrepreneurPulse.com.au  Fact Sheet for Healthcare Providers: IncredibleEmployment.be  This test is not yet approved or cleared by the [place] FDA and has been authorized for detection and/or diagnosis of SARS-CoV-2 by FDA under an Emergency Use Authorization (EUA). This EUA will remain in effect (meaning this test can be used) for the duration of the COVID-19 declaration under  Section 564(b)(1) of the Act, 21 U.S.C. section 360bbb-3(b)(1), unless the authorization is terminated or revoked.  Performed at [facility], [place] [address], [place], [place] [address]   Surgical pcr screen     Status: None   Collection Time: 02/23/20 11:49 PM   Specimen: Nasal Mucosa; Nasal Swab  Result Value Ref Range Status   MRSA, PCR NEGATIVE NEGATIVE Final   Staphylococcus aureus NEGATIVE NEGATIVE Final    Comment: (NOTE) The Xpert SA Assay (FDA approved for NASAL specimens in patients 22 years of age and older), is one component of a comprehensive surveillance program. It is not intended to diagnose infection nor to guide or monitor treatment. Performed at [facility], [place] [address], [place], [place] [address]   SARS Coronavirus 2 by RT PCR (hospital order, performed in [facility] hospital lab) Nasopharyngeal Nasopharyngeal Swab     Status: None   Collection Time: 02/26/20 11:20 AM   Specimen: Nasopharyngeal Swab  Result Value Ref Range Status   SARS Coronavirus 2 NEGATIVE NEGATIVE Final    Comment: (NOTE) SARS-CoV-2 target nucleic acids are NOT DETECTED.  The SARS-CoV-2 RNA is generally detectable in upper and  lower respiratory specimens during the acute phase of infection. The lowest concentration of SARS-CoV-2 viral copies this assay can detect is 250 copies / mL. A negative result does not preclude SARS-CoV-2 infection and should not be used as the sole basis for treatment or other patient management decisions.  A negative result may occur with improper specimen collection / handling, submission of specimen other than nasopharyngeal swab, presence of viral mutation(s) within the areas targeted by this assay, and inadequate number of viral copies (<250 copies / mL). A negative result must be combined with clinical observations, patient history, and epidemiological information.  Fact Sheet for Patients:   StrictlyIdeas.no  Fact Sheet for Healthcare Providers: BankingDealers.co.za  This test is not yet approved or  cleared by the [place] FDA and has been authorized for detection and/or diagnosis of SARS-CoV-2 by FDA under an Emergency Use Authorization (EUA).  This EUA will remain in effect (meaning this test can be used) for the duration of the COVID-19 declaration under Section 564(b)(1) of the Act, 21 U.S.C. section 360bbb-3(b)(1), unless the authorization is terminated or revoked sooner.  Performed at [facility], [place] [address] [place], [place] [address]          Radiology Studies: No results found.      Scheduled Meds:  budesonide  0.25 mg Nebulization BID   buPROPion  300 mg Oral Daily   calcium-vitamin D  1 tablet Oral Daily   docusate sodium  100 mg Oral BID   enoxaparin (LOVENOX) injection  40 mg Subcutaneous Q24H   famotidine  20 mg Oral QHS   fentaNYL (SUBLIMAZE) injection  12.5 mcg Intravenous Once   FLUoxetine  20 mg Oral BID   folic acid  1 mg Oral Daily   levothyroxine  112 mcg Oral QAC breakfast   multivitamin with minerals  1 tablet Oral Daily   oxcarbazepine  600 mg  Oral Daily   oxyCODONE  10 mg Oral BID   sucralfate  1 g Oral TID WC & HS   thiamine  100 mg Oral Daily   Or   thiamine  100 mg Intravenous Daily   Continuous Infusions:  sodium chloride 100 mL/hr at 03/01/20 1336     LOS: 7 days    Time spent:0 min  Allie Bossier, MD [facility] Pager [phone]  If 7PM-7AM, please contact night-coverage [email or web address] Password [email or web address] 03/01/2020, 2:06 PM

## 2020-03-01 NOTE — TOC Progression Note (Signed)
Transition of Care [facility]) - Progression Note    Patient Details  Name: Victoria Levine MRN: [id number] Date of Birth: Jul 06, 1950  [facility]) CM/SW Contact  Purcell Mouton, RN Phone Number: 03/01/2020, 11:38 AM  Clinical Narrative:    Pt selected [facility] for SNF. Pt was alert and oriented at present time.    Expected Discharge Plan: [place] Barriers to Discharge: Continued Medical Work up,Insurance [place] (PASRR)  Expected Discharge Plan and Services Expected Discharge Plan: [place] In-house Referral: Clinical Social Work Discharge Planning Services: CM Consult Post Acute Care Choice: [place] arrangements for the past 2 months: [place] Determinants of Health (SDOH) Interventions    Readmission Risk Interventions Readmission Risk Prevention Plan 02/25/2020  Transportation Screening Complete  Medication Review Press photographer) Complete  PCP or Specialist appointment within 3-5 days of discharge Complete  SW Recovery Care/Counseling Consult Complete  Skilled Nursing Facility Complete  Some recent data might be hidden

## 2020-03-02 ENCOUNTER — Inpatient Hospital Stay (HOSPITAL_COMMUNITY): Payer: Medicare HMO

## 2020-03-02 LAB — COMPREHENSIVE METABOLIC PANEL
ALT: 23 U/L (ref 0–44)
AST: 19 U/L (ref 15–41)
Albumin: 3.3 g/dL — ABNORMAL LOW (ref 3.5–5.0)
Alkaline Phosphatase: 59 U/L (ref 38–126)
Anion gap: 7 (ref 5–15)
BUN: <5 mg/dL — ABNORMAL LOW (ref 8–23)
CO2: 25 mmol/L (ref 22–32)
Calcium: 9.5 mg/dL (ref 8.9–10.3)
Chloride: 108 mmol/L (ref 98–111)
Creatinine, Ser: 0.54 mg/dL (ref 0.44–1.00)
GFR, Estimated: >60 mL/min (ref >60)
Glucose, Bld: 103 mg/dL — ABNORMAL HIGH (ref 70–99)
Potassium: 4 mmol/L (ref 3.5–5.1)
Sodium: 140 mmol/L (ref 135–145)
Total Bilirubin: 0.4 mg/dL (ref 0.3–1.2)
Total Protein: 6.1 g/dL — ABNORMAL LOW (ref 6.5–8.1)

## 2020-03-02 LAB — PHOSPHORUS: Phosphorus: 1.8 mg/dL — ABNORMAL LOW (ref 2.5–4.6)

## 2020-03-02 LAB — CBC WITH DIFFERENTIAL/PLATELET
Abs Immature Granulocytes: 0.16 10*3/uL — ABNORMAL HIGH (ref 0.00–0.07)
Basophils Absolute: 0.1 10*3/uL (ref 0.0–0.1)
Basophils Relative: 1 %
Eosinophils Absolute: 0.7 10*3/uL — ABNORMAL HIGH (ref 0.0–0.5)
Eosinophils Relative: 7 %
HCT: 30.3 % — ABNORMAL LOW (ref 36.0–46.0)
Hemoglobin: 9.1 g/dL — ABNORMAL LOW (ref 12.0–15.0)
Immature Granulocytes: 2 %
Lymphocytes Relative: 21 %
Lymphs Abs: 2 10*3/uL (ref 0.7–4.0)
MCH: 25 pg — ABNORMAL LOW (ref 26.0–34.0)
MCHC: 30 g/dL (ref 30.0–36.0)
MCV: 83.2 fL (ref 80.0–100.0)
Monocytes Absolute: 0.8 10*3/uL (ref 0.1–1.0)
Monocytes Relative: 9 %
Neutro Abs: 5.7 10*3/uL (ref 1.7–7.7)
Neutrophils Relative %: 60 %
Platelets: 478 10*3/uL — ABNORMAL HIGH (ref 150–400)
RBC: 3.64 MIL/uL — ABNORMAL LOW (ref 3.87–5.11)
RDW: 15 % (ref 11.5–15.5)
WBC: 9.5 10*3/uL (ref 4.0–10.5)
nRBC: 0 % (ref 0.0–0.2)

## 2020-03-02 LAB — MAGNESIUM: Magnesium: 2.1 mg/dL (ref 1.7–2.4)

## 2020-03-02 MED ORDER — POTASSIUM & SODIUM PHOSPHATES 280-160-250 MG PO PACK
1.0000 | PACK | Freq: Three times a day (TID) | ORAL | Status: AC
  Administered 2020-03-02 (×2): 1 via ORAL
  Filled 2020-03-02 (×3): qty 1

## 2020-03-02 MED ORDER — ENSURE ENLIVE PO LIQD
237.0000 mL | ORAL | Status: DC
  Administered 2020-03-03: 237 mL via ORAL

## 2020-03-02 MED ORDER — BUDESONIDE 0.25 MG/2ML IN SUSP: 0.2500 mg | Freq: Two times a day (BID) | RESPIRATORY_TRACT | 12 refills | Status: DC

## 2020-03-02 MED ORDER — ENSURE ENLIVE PO LIQD: 237.0000 mL | ORAL | 12 refills | Status: DC

## 2020-03-02 MED ORDER — DOCUSATE SODIUM 100 MG PO CAPS: 100.0000 mg | ORAL_CAPSULE | Freq: Two times a day (BID) | ORAL | 0 refills | Status: DC

## 2020-03-02 MED ORDER — FOLIC ACID 1 MG PO TABS: 1.0000 mg | ORAL_TABLET | Freq: Every day | ORAL | Status: DC

## 2020-03-02 MED ORDER — QUETIAPINE FUMARATE 100 MG PO TABS: 100.0000 mg | ORAL_TABLET | Freq: Every evening | ORAL | Status: DC | PRN

## 2020-03-02 NOTE — Plan of Care (Signed)
  Problem: Clinical Measurements: Goal: Cardiovascular complication will be avoided Outcome: Progressing   Problem: Elimination: Goal: Will not experience complications related to bowel motility Outcome: Progressing   Problem: Education: Goal: Knowledge of the prescribed therapeutic regimen will improve Outcome: Progressing Goal: Understanding of discharge needs will improve Outcome: Progressing   Problem: Activity: Goal: Ability to avoid complications of mobility impairment will improve Outcome: Progressing   Problem: Skin Integrity: Goal: Will show signs of wound healing Outcome: Progressing   Problem: Education: Goal: Knowledge of General Education information will improve Description: Including pain rating scale, medication(s)/side effects and non-pharmacologic comfort measures Outcome: Adequate for Discharge   Problem: Clinical Measurements: Goal: Will remain free from infection Outcome: Adequate for Discharge   Problem: Nutrition: Goal: Adequate nutrition will be maintained Outcome: Completed/Met   Problem: Pain Management: Goal: Pain level will decrease with appropriate interventions Outcome: Completed/Met

## 2020-03-02 NOTE — Progress Notes (Signed)
Initial Nutrition Assessment  DOCUMENTATION CODES:   Not applicable  INTERVENTION:  - will order Ensure Enlive once/day, each supplement provides 350 kcal and 20 grams of protein. - will order Magic Cup BID with meals, each supplement provides 290 kcal and 9 grams of protein.  Monitor magnesium, potassium, and phosphorus daily for at least 3 days, MD to replete as needed, as pt is at risk for refeeding syndrome given persistent hypophosphatemia; hypokalemia and hypomagnesemia requiring repletion earlier in admission.   NUTRITION DIAGNOSIS:   Increased nutrient needs related to post-op healing,chronic illness as evidenced by estimated needs.  GOAL:   Patient will meet greater than or equal to 90% of their needs  MONITOR:   PO intake,Supplement acceptance,Labs,Weight trends  REASON FOR ASSESSMENT:   Malnutrition Screening Tool  ASSESSMENT:   69 y.o. female with medical history of suicide attempt, alcohol abuse, bipolar disorder, herpes, anxiety, DDD, GERD, asthma, hypothyroidism, and osteoporosis. She presented to the ED after a mechanical fall at home with associated L hip/L-sided pain. She underwent L total hip arthroplasty 12/8.  Diet advanced from NPO to Regular on 12/8 following surgery. Patient's intakes have been variable: 0-100%, mainly 0-25%. She denies any changes in appetite and feels that she is eating more the past 2-3 days.   She denies any chewing or swallowing difficulties with current diet order.   She was previously experiencing esophageal pain with swallowing foods and beverages, but this has greatly subsided over the past 1 week with addition of carafate.   Patient is unsure of UBW. Weight on 12/7 was 168 lb which appears to be an outlier among weights recorded over the past 2 years. Weight had been fairly stable (150-160 lb) from 02/01/18-12/31/19.   Per notes: - L femur fx s/p L total hip arthroplasty  - hx of alcohol abuse with concern for withdrawal  this admission - persistent hypophosphatemia - from home with likely plan for SNF at d/c   Labs reviewed; BUN: <5 mg/dl, Phos: 1.8 mg/dl.  Medications reviewed; 1 tablet oscal-D/day, 100 mg colace BID, 20 mg oral pepcid/day, 1 mg fovite/day, 112 mcg oral synthroid/day, 2 g IV Mg sulfate x1 dose 12/14, 1 tablet multivitamin with minerals/day, 1 packet phos-nak x3 doses 12/15, 1 g carafate TID, 50 mEq Klor-Con x2 doses 12/14, 100 mg thiamine/day.  IVF; NS @ 50 ml/hr.     NUTRITION - FOCUSED PHYSICAL EXAM:  completed; no muscle depletions, no fat depletions, mild edema to LLE  Diet Order:   Diet Order            Diet regular Room service appropriate? Yes; Fluid consistency: Thin  Diet effective now                 EDUCATION NEEDS:   No education needs have been identified at this time  Skin:  Skin Assessment: Skin Integrity Issues: Skin Integrity Issues:: Incisions Incisions: L hip (12/8)  Last BM:  12/14 (type 4)  Height:   Ht Readings from Last 1 Encounters:  02/23/20 5\' 7"  (1.702 m)    Weight:   Wt Readings from Last 1 Encounters:  02/23/20 76.2 kg    Estimated Nutritional Needs:  Kcal:  1900-2100 kcal Protein:  90-105 grams Fluid:  >/= 2 L/day      Jarome Matin, MS, RD, LDN, CNSC Inpatient Clinical Dietitian RD pager # available in AMION  After hours/weekend pager # available in [facility]

## 2020-03-02 NOTE — TOC Progression Note (Signed)
Transition of Care [facility]) - Progression Note    Patient Details  Name: Victoria Levine MRN: [id number] Date of Birth: [birth year]  Transition of Care [facility]) CM/SW Contact  Purcell Mouton, RN Phone Number: 03/02/2020, 2:47 PM  Clinical Narrative:     SNF wants to wait until tomorrow related to MD note on 12/14 of Alcohol Withdrawal and SNF need to make sure she is not in active withdrawal.   Expected Discharge Plan: [place] Barriers to Discharge: Continued Medical Work up,Insurance [place] (PASRR)  Expected Discharge Plan and Services Expected Discharge Plan: [place] In-house Referral: Clinical Social Work Discharge Planning Services: CM Consult Post Acute Care Choice: [place] arrangements for the past 2 months: Single Family Home                                       Social Determinants of Health (SDOH) Interventions    Readmission Risk Interventions Readmission Risk Prevention Plan 02/25/2020  Transportation Screening Complete  Medication Review Press photographer) Complete  PCP or Specialist appointment within 3-5 days of discharge Complete  SW Recovery Care/Counseling Consult Complete  Skilled Nursing Facility Complete  Some recent data might be hidden

## 2020-03-02 NOTE — Progress Notes (Signed)
Physical Therapy Treatment Patient Details Name: Victoria Levine MRN: [id number] DOB: May 27, 1950 Today's Date: 03/02/2020    History of Present Illness Pt s/p fall with L hip fx and now s/p L THR by ant direct approach.  Pt with hx of comp fx, kyphoplasty, lumbar fusion and bipolar.    PT Comments    POD # 7 Pt more sprite and conversive and pleasantly confused.  "There's a large airplane in the yard and it has diamonds all over it".  Then next sentence pt makes complete sense as she knew the [place] was on 75.   General Comments: AxO x 2 intermittent confusion to current sitation/place/time.  Pt is following functional commands but easily distracted and random irrelevant conversation.  Able to redirect to task briefly, pleasant but confused. Assisted OOB to amb.  General bed mobility comments: increased physical ability to self perform with 75% VC's to stay on task and functional VC's to complete.  General transfer comment: increased physical ability to rise from bed with heavy lean on walker.  Unsteady.  Weak.  75% VC's on safety with turn completion and hand placement to control desend.  Pt "plops" due to fatigue. General Gait Details: Very unsteadt gait.  Unpredictable when pt desides to sit due to AMS.  Recliner following closely behind.  Very unsteady gait.  Able to tolerate full WBing L LE with no c/o pain other that "sore".  Tolerated amb 9 feet then another 8 feet back to bed.  HIGH FALL RISK.  Assisted back to bed and activated bed alarm.   Pt lives home alone and was driving.  Plans are for ST Rehab at SNF.  Follow Up Recommendations  SNF     Equipment Recommendations  None recommended by PT    Recommendations for Other Services       Precautions / Restrictions Precautions Precautions: Fall Restrictions Weight Bearing Restrictions: No LLE Weight Bearing: Weight bearing as tolerated    Mobility  Bed Mobility Overal bed mobility: Needs Assistance Bed  Mobility: Supine to Sit;Sit to Supine     Supine to sit: Min assist Sit to supine: Min assist   General bed mobility comments: increased physical ability to self perform with 75% VC's to stay on task and functional VC's to complete  Transfers Overall transfer level: Needs assistance Equipment used: Rolling walker (2 wheeled);None Transfers: Sit to/from [employer] to Stand: Min assist Stand pivot transfers: Min assist       General transfer comment: increased physical ability to rise from bed with heavy lean on walker.  Unsteady.  Weak.  75% VC's on safety with turn completion and hand placement to control desend.  Pt "plops" due to fatigue.  Ambulation/Gait Ambulation/Gait assistance: Mod assist Gait Distance (Feet): 16 Feet (8 feet x 2 one seated rest break) Assistive device: Rolling walker (2 wheeled) Gait Pattern/deviations: Step-to pattern;Decreased step length - right;Decreased step length - left;Shuffle;Trunk flexed Gait velocity: decr   General Gait Details: Very unsteadt gait.  Unpredictable when pt desides to sit due to AMS.  Recliner following closely behind.  Very unsteady gait.  Able to tolerate full WBing L LE with no c/o pain other that "sore".  Tolerated amb 9 feet then another 8 feet back to bed.  HIGH FALL RISK.   Stairs             Wheelchair Mobility    Modified Rankin (Stroke Patients Only)       Balance  Cognition Arousal/Alertness: Awake/alert                                     General Comments: AxO x 2 intermittent confusion to current sitation/place/time.  Pt is following functional commands but easily distracted and random irrelevant conversation.  Able to redirect to task briefly, pleasant but confused.      Exercises      General Comments        Pertinent Vitals/Pain Pain Assessment: Faces Faces Pain Scale: Hurts a little  bit Pain Location: L hip with mobility Pain Descriptors / Indicators: Aching Pain Intervention(s): Monitored during session;Repositioned    Home Living                      Prior Function            PT Goals (current goals can now be found in the care plan section) Progress towards PT goals: Progressing toward goals    Frequency    Min 3X/week      PT Plan Current plan remains appropriate    Co-evaluation              AM-PAC PT "6 Clicks" Mobility   Outcome Measure  Help needed turning from your back to your side while in a flat bed without using bedrails?: A Little Help needed moving from lying on your back to sitting on the side of a flat bed without using bedrails?: A Little Help needed moving to and from a bed to a chair (including a wheelchair)?: A Lot Help needed standing up from a chair using your arms (e.g., wheelchair or bedside chair)?: A Lot Help needed to walk in hospital room?: A Lot Help needed climbing 3-5 steps with a railing? : Total 6 Click Score: 13    End of Session Equipment Utilized During Treatment: Gait belt Activity Tolerance: Patient limited by fatigue Patient left: in bed;with call bell/phone within reach;with bed alarm set Nurse Communication: Mobility status PT Visit Diagnosis: Difficulty in walking, not elsewhere classified (R26.2);History of falling (Z91.81)     Time: 6811-5726 PT Time Calculation (min) (ACUTE ONLY): 26 min  Charges:  $Gait Training: 8-22 mins $Therapeutic Activity: 8-22 mins                     Rica Koyanagi  PTA Acute  Rehabilitation Services Pager      [phone] Office      [phone]

## 2020-03-02 NOTE — Progress Notes (Signed)
Pt had a fall this am. There was no witness to the fall. Patient was assessed vital signs within normal range, Alert and Oriented  x 4. CT of head  and Xray of left hip confirmed no injury. Safety mat placed at bedside and bed alarm on. Will continue to monitor.

## 2020-03-02 NOTE — Progress Notes (Addendum)
PROGRESS NOTE    Victoria Levine  ONG:[id number] DOB: 1950/03/27 DOA: 02/23/2020 PCP: [name], MD   Brief Narrative: Taken from prior notes Victoria Levine a 69 y.o.WF PMHx Suicide attempt, EtOH abuse, Bipolar disorder, Herpes, anxiety DO, degenerative disc disease, GERD, asthma, hypothyroidism, osteoporosis presented after having a mechanical fall resulted in left displaced intertrochanteric femoral fracture, s/p total hip arthroplasty by orthopedic. PT recommending SNF placement, obtained insurance authorization today but SNF wants to make sure that she is not an active alcohol withdrawal which was mentioned in progress notes.  Patient is here for more than 1 week and there is no active withdrawal at this time.  Patient with multiple psych meds and underlying psych issues which can be responsible for her wandering thoughts. Had a fall while in the hospital today and was complaining of hitting her head and twisting her left leg, repeat imaging without any acute changes.  Subjective: Patient denies any acute pain.  Stating that she did hit her head when she fell.  Per patient she was trying to get out of bed to go to bathroom and her hip giveaway. Continue to have some wandering thoughts, worried about her animals at home and wants her brother to stay at her house to take care of her house and belongings. Occasionally seems hallucinating but able to answer appropriately for all the questions including all the orientation questions.  Assessment & Plan:   Principal Problem:   Displaced intertrochanteric fracture of left femur, initial encounter for closed fracture [facility]) Active Problems:   Hypothyroidism   HYPERCHOLESTEROLEMIA   Bipolar disorder ([place])   Asthma-chronic obstructive pulmonary disease overlap syndrome (HCC)   GERD (gastroesophageal reflux disease)   Osteoporosis   Alcohol abuse   Hypokalemia   Acute metabolic encephalopathy  Left displaced intertrochanteric  femoral fracture s/p total hip arthroplasty. Patient had another fall today and repeat imaging without any other acute abnormality. Seems well controlled. PT recommending SNF placement, obtained insurance authorization today, rapid Covid was ordered but then SNF wants to postpone for 1 more day as alcohol withdrawal was mentioned in her progress note. Patient is in the hospital for more than 1 week, having some wandering thoughts, no chance of alcohol withdrawal at this time. -Continue with pain management and DVT prophylaxis.  Acute metabolic encephalopathy.  Has been resolved. Patient was alert and oriented and able to answer all the questions appropriately. No alcohol withdrawal at this time. Patient do have some wandering thoughts and intermittently seems like having some hallucinations, patient was on multiple psych meds and history of bipolar disorder may be some other underlying psych issues, which can be addressed by psychiatrist as an outpatient.  We will continue her home meds and she will follow up with psychiatry as an outpatient. She was on multiple sedating medications on admission and that can contribute to her acute encephalopathy in the beginning, they have been discontinued and she will only getting OxyContin 10 mg twice daily for pain management. Can have some sundowning effect.  Recurrent fall.  Patient had a fall again earlier this morning while trying to get up from bed, stating that she wants to go to the bathroom in her hip giveaway. Repeat CT head without any acute abnormality.  Repeat left femoral x-ray as there was some concern of twisting of that leg was without any new abnormality or displacement.  Osteoporosis. -Continue home meds.  History of alcohol use.  Last drink was 2 days prior to this admission which  makes it about 10 days now.  No chance of alcohol withdrawal. -Continue with thiamine  History of bipolar disorder.  Will need outpatient psychiatry  evaluation.  Continue to have some wandering thoughts. -Continue home meds of Wellbutrin, fluoxetine and as needed Seroquel.  Asthma/COPD.  No acute concern. -Continue home bronchodilators.  Hypothyroidism. -Continue home dose of Synthroid.  Electrolyte abnormalities.  Patient did had some hypokalemia, hypomagnesemia and hypophosphatemia during hospitalization which were repleted as needed. Phosphorus was 1.8 this morning which was repleted again today.  Magnesium and calcium within normal limit.  Objective: Vitals:   03/02/20 0545 03/02/20 0814 03/02/20 0859 03/02/20 1415  BP: (!) 168/92  (!) 156/98 (!) 160/91  Pulse: 73  76 81  Resp: 18  18 17   Temp: 99.7 F (37.6 C)  98.7 F (37.1 C) 98.9 F (37.2 C)  TempSrc: Oral  Oral Oral  SpO2: 95% 96% 97% 98%  Weight:      Height:        Intake/Output Summary (Last 24 hours) at 03/02/2020 1500 Last data filed at 03/02/2020 1300 Gross per 24 hour  Intake 2462.31 ml  Output 3150 ml  Net -687.69 ml   Filed Weights   02/23/20 1701  Weight: 76.2 kg    Examination:  General exam: Appears calm and comfortable  Respiratory system: Clear to auscultation. Respiratory effort normal. Cardiovascular system: S1 & S2 heard, RRR.  Gastrointestinal system: Soft, nontender, nondistended, bowel sounds positive. Central nervous system: Alert and oriented. No focal neurological deficits. Extremities: No edema, no cyanosis, pulses intact and symmetrical. Skin: No rashes, lesions or ulcers Psychiatry: Judgement and insight appear normal.   DVT prophylaxis: Lovenox Code Status: Full Family Communication: Discussed with patient Disposition Plan:  Status is: Inpatient  Remains inpatient appropriate because:Inpatient level of care appropriate due to severity of illness   Dispo: The patient is from: Home              Anticipated d/c is to: SNF              Anticipated d/c date is: 1 day              Patient currently is medically stable  to d/c.   Consultants:   Orthopedic surgery  Procedures:  Antimicrobials:   Data Reviewed: I have personally reviewed following labs and imaging studies  CBC: Recent Labs  Lab 02/27/20 0332 02/27/20 1125 02/28/20 0447 02/29/20 0822 03/01/20 0419 03/02/20 0451  WBC 8.5 10.3 8.0 9.1 8.3 9.5  NEUTROABS 6.2  --  5.2 6.1 4.8 5.7  HGB 8.1* 8.7* 8.6* 9.6* 8.4* 9.1*  HCT 26.7* 27.9* 28.4* 31.7* 27.2* 30.3*  MCV 83.2 82.3 82.3 82.6 82.9 83.2  PLT 248 288 289 399 400 096*   Basic Metabolic Panel: Recent Labs  Lab 02/27/20 1125 02/28/20 0447 02/29/20 0822 03/01/20 0419 03/02/20 0451  NA 136 136 137 138 140  K 3.2* 3.0* 3.4* 3.1* 4.0  CL 101 100 102 105 108  CO2 26 25 25 23 25   GLUCOSE 135* 101* 104* 102* 103*  BUN 7* 7* 8 9 <5*  CREATININE 0.49 0.44 0.48 0.57 0.54  CALCIUM 9.0 8.9 9.3 8.8* 9.5  MG 1.9 2.0 1.8 1.6* 2.1  PHOS 1.6* 2.8 2.2* 3.2 1.8*   GFR: Estimated Creatinine Clearance: 69.6 mL/min (by C-G formula based on SCr of 0.54 mg/dL). Liver Function Tests: Recent Labs  Lab 02/27/20 1125 02/28/20 0447 02/29/20 0822 03/01/20 0419 03/02/20 0451  AST 35 29 31  22 19  ALT 22 23 30 26 23   ALKPHOS 55 51 54 44 59  BILITOT 0.5 0.4 0.5 0.3 0.4  PROT 6.4* 5.9* 6.6 5.4* 6.1*  ALBUMIN 3.4* 2.9* 3.3* 2.8* 3.3*   No results for input(s): LIPASE, AMYLASE in the last 168 hours. No results for input(s): AMMONIA in the last 168 hours. Coagulation Profile: No results for input(s): INR, PROTIME in the last 168 hours. Cardiac Enzymes: No results for input(s): CKTOTAL, CKMB, CKMBINDEX, TROPONINI in the last 168 hours. BNP (last 3 results) No results for input(s): PROBNP in the last 8760 hours. HbA1C: No results for input(s): HGBA1C in the last 72 hours. CBG: No results for input(s): GLUCAP in the last 168 hours. Lipid Profile: No results for input(s): CHOL, HDL, LDLCALC, TRIG, CHOLHDL, LDLDIRECT in the last 72 hours. Thyroid Function Tests: No results for input(s):  TSH, T4TOTAL, FREET4, T3FREE, THYROIDAB in the last 72 hours. Anemia Panel: No results for input(s): VITAMINB12, FOLATE, FERRITIN, TIBC, IRON, RETICCTPCT in the last 72 hours. Sepsis Labs: No results for input(s): PROCALCITON, LATICACIDVEN in the last 168 hours.  Recent Results (from the past 240 hour(s))  Resp Panel by RT-PCR (Flu A&B, Covid) Nasopharyngeal Swab     Status: None   Collection Time: 02/23/20  6:48 PM   Specimen: Nasopharyngeal Swab; Nasopharyngeal(NP) swabs in vial transport medium  Result Value Ref Range Status   SARS Coronavirus 2 by RT PCR NEGATIVE NEGATIVE Final    Comment: (NOTE) SARS-CoV-2 target nucleic acids are NOT DETECTED.  The SARS-CoV-2 RNA is generally detectable in upper respiratory specimens during the acute phase of infection. The lowest concentration of SARS-CoV-2 viral copies this assay can detect is 138 copies/mL. A negative result does not preclude SARS-Cov-2 infection and should not be used as the sole basis for treatment or other patient management decisions. A negative result may occur with  improper specimen collection/handling, submission of specimen other than nasopharyngeal swab, presence of viral mutation(s) within the areas targeted by this assay, and inadequate number of viral copies(<138 copies/mL). A negative result must be combined with clinical observations, patient history, and epidemiological information. The expected result is Negative.  Fact Sheet for Patients:  EntrepreneurPulse.com.au  Fact Sheet for Healthcare Providers:  IncredibleEmployment.be  This test is no t yet approved or cleared by the [place] FDA and  has been authorized for detection and/or diagnosis of SARS-CoV-2 by FDA under an Emergency Use Authorization (EUA). This EUA will remain  in effect (meaning this test can be used) for the duration of the COVID-19 declaration under Section 564(b)(1) of the Act,  21 U.S.C.section 360bbb-3(b)(1), unless the authorization is terminated  or revoked sooner.       Influenza A by PCR NEGATIVE NEGATIVE Final   Influenza B by PCR NEGATIVE NEGATIVE Final    Comment: (NOTE) The Xpert Xpress SARS-CoV-2/FLU/RSV plus assay is intended as an aid in the diagnosis of influenza from Nasopharyngeal swab specimens and should not be used as a sole basis for treatment. Nasal washings and aspirates are unacceptable for Xpert Xpress SARS-CoV-2/FLU/RSV testing.  Fact Sheet for Patients: EntrepreneurPulse.com.au  Fact Sheet for Healthcare Providers: IncredibleEmployment.be  This test is not yet approved or cleared by the [place] FDA and has been authorized for detection and/or diagnosis of SARS-CoV-2 by FDA under an Emergency Use Authorization (EUA). This EUA will remain in effect (meaning this test can be used) for the duration of the COVID-19 declaration under Section 564(b)(1) of the Act, 21 U.S.C. section  360bbb-3(b)(1), unless the authorization is terminated or revoked.  Performed at [facility], [place] [address], [place], [place] [address]   Surgical pcr screen     Status: None   Collection Time: 02/23/20 11:49 PM   Specimen: Nasal Mucosa; Nasal Swab  Result Value Ref Range Status   MRSA, PCR NEGATIVE NEGATIVE Final   Staphylococcus aureus NEGATIVE NEGATIVE Final    Comment: (NOTE) The Xpert SA Assay (FDA approved for NASAL specimens in patients 21 years of age and older), is one component of a comprehensive surveillance program. It is not intended to diagnose infection nor to guide or monitor treatment. Performed at [facility], [place] [address] [place], [place] [address]   SARS Coronavirus 2 by RT PCR (hospital order, performed in [facility] hospital lab) Nasopharyngeal Nasopharyngeal Swab     Status: None   Collection Time: 02/26/20 11:20 AM   Specimen:  Nasopharyngeal Swab  Result Value Ref Range Status   SARS Coronavirus 2 NEGATIVE NEGATIVE Final    Comment: (NOTE) SARS-CoV-2 target nucleic acids are NOT DETECTED.  The SARS-CoV-2 RNA is generally detectable in upper and lower respiratory specimens during the acute phase of infection. The lowest concentration of SARS-CoV-2 viral copies this assay can detect is 250 copies / mL. A negative result does not preclude SARS-CoV-2 infection and should not be used as the sole basis for treatment or other patient management decisions.  A negative result may occur with improper specimen collection / handling, submission of specimen other than nasopharyngeal swab, presence of viral mutation(s) within the areas targeted by this assay, and inadequate number of viral copies (<250 copies / mL). A negative result must be combined with clinical observations, patient history, and epidemiological information.  Fact Sheet for Patients:   StrictlyIdeas.no  Fact Sheet for Healthcare Providers: BankingDealers.co.za  This test is not yet approved or  cleared by the [place] FDA and has been authorized for detection and/or diagnosis of SARS-CoV-2 by FDA under an Emergency Use Authorization (EUA).  This EUA will remain in effect (meaning this test can be used) for the duration of the COVID-19 declaration under Section 564(b)(1) of the Act, 21 U.S.C. section 360bbb-3(b)(1), unless the authorization is terminated or revoked sooner.  Performed at [facility], [place] [address] [place], [place] [address]      Radiology Studies: CT HEAD WO CONTRAST  Result Date: 03/02/2020 CLINICAL DATA:  Head trauma, no focal neuro findings. Additional history provided: Unwitnessed fall. EXAM: CT HEAD WITHOUT CONTRAST TECHNIQUE: Contiguous axial images were obtained from the base of the skull through the vertex without intravenous contrast. COMPARISON:   Prior head CT examinations 02/23/2020 and earlier. FINDINGS: Brain: Mildly motion degraded exam. Mild cerebral atrophy. Mild ill-defined hypoattenuation within the cerebral white matter is nonspecific, but compatible chronic small vessel ischemic disease. A small remote infarct is again questioned within the superior right cerebellar hemisphere. Redemonstrated 16 mm lobular focus of calcification along the left aspect of the posterior falx likely reflecting a meningioma. There is no acute intracranial hemorrhage. No demarcated cortical infarct. No extra-axial fluid collection. No midline shift. Vascular: No hyperdense vessel.  Atherosclerotic calcifications. Skull: No calvarial fracture. Subcentimeter ground-glass lesion in the paramedian left frontal calvarium demonstrating long-term stability, benign. Sinuses/Orbits: Visualized orbits show no acute finding. Minimal ethmoid sinus mucosal thickening. IMPRESSION: Mildly motion degraded examination. No acute posttraumatic intracranial findings. Mild cerebral atrophy and chronic small vessel ischemic disease. A small chronic infarct is again questioned within  the superior right cerebellar hemisphere. Redemonstrated 16 mm lobular focus of calcification along the left aspect of the posterior falx, likely reflecting a meningioma. Minimal ethmoid sinus mucosal thickening Electronically Signed   By: Kellie Simmering DO   On: 03/02/2020 13:10   DG FEMUR MIN 2 VIEWS LEFT  Result Date: 03/02/2020 CLINICAL DATA:  Recent fall with left hip pain, initial encounter EXAM: LEFT FEMUR 2 VIEWS COMPARISON:  02/24/2020 FINDINGS: Left hip prosthesis is again identified. No dislocation is seen. No periprosthetic fracture is noted. No soft tissue abnormality is seen. IMPRESSION: Left hip replacement.  No acute abnormality noted. Electronically Signed   By: [name] M.D.   On: 03/02/2020 13:29    Scheduled Meds: . budesonide  0.25 mg Nebulization BID  . buPROPion  300 mg Oral  Daily  . calcium-vitamin D  1 tablet Oral Daily  . docusate sodium  100 mg Oral BID  . enoxaparin (LOVENOX) injection  40 mg Subcutaneous Q24H  . famotidine  20 mg Oral QHS  . [START ON 03/03/2020] feeding supplement  237 mL Oral Q24H  . fentaNYL (SUBLIMAZE) injection  12.5 mcg Intravenous Once  . FLUoxetine  20 mg Oral BID  . folic acid  1 mg Oral Daily  . levothyroxine  112 mcg Oral QAC breakfast  . multivitamin with minerals  1 tablet Oral Daily  . oxcarbazepine  600 mg Oral Daily  . oxyCODONE  10 mg Oral BID  . potassium & sodium phosphates  1 packet Oral TID WC & HS  . sucralfate  1 g Oral TID WC & HS  . thiamine  100 mg Oral Daily   Or  . thiamine  100 mg Intravenous Daily   Continuous Infusions: . sodium chloride 50 mL/hr at 03/01/20 2353     LOS: 8 days   Time spent: 35 minutes.  Lorella Nimrod, MD [facility]  If 7PM-7AM, please contact night-coverage [email or web address]  03/02/2020, 3:00 PM   This record has been created using Systems analyst. Errors have been sought and corrected,but may not always be located. Such creation errors do not reflect on the standard of care.

## 2020-03-02 NOTE — Progress Notes (Signed)
   03/02/20 0900  What Happened  Was fall witnessed? No  Was patient injured? Unsure  Patient found on floor  Found by Staff-comment Aline Brochure NT responded to bed alarm)  Stated prior activity bathroom-unassisted  Follow Up  MD notified Dr Reesa Chew  Time MD notified 0900  Family notified No - patient refusal (pt stated there is no one to call and refused for [place] to call her brother)  Additional tests Yes-comment (CT head and x-ray left leg)  Simple treatment Other (comment)  Progress note created (see row info)  (None)  Adult Fall Risk Assessment  Risk Factor Category (scoring not indicated) Fall has occurred during this admission (document High fall risk);High fall risk per protocol (document High fall risk)  Patient Fall Risk Level High fall risk  Adult Fall Risk Interventions  Required Bundle Interventions *See Row Information* High fall risk - low, moderate, and high requirements implemented  Additional Interventions Use of appropriate toileting equipment (bedpan, BSC, etc.)  Screening for Fall Injury Risk (To be completed on HIGH fall risk patients) - Assessing Need for Low Bed  Risk For Fall Injury- Low Bed Criteria Previous fall this admission  Will Implement Low Bed and Floor Mats Low bed contraindicated, floor mats in place  Specialty Low Bed Contraindicated Hip precautions  Screening for Fall Injury Risk (To be completed on HIGH fall risk patients who do not meet crieteria for Low Bed) - Assessing Need for Floor Mats Only  Risk For Fall Injury- Criteria for Floor Mats Noncompliant with safety precautions  Will Implement Floor Mats Yes  Pain Assessment  Pain Scale 0-10  Pain Score 8  Pain Type Acute pain  Pain Location Back  Pain Orientation Lower  Pain Descriptors / Indicators Aching  Pain Frequency Other (Comment) (with fall)  Pain Onset Sudden  Pain Intervention(s) MD notified (Comment)  Neurological  Level of Consciousness Alert  Orientation Level Oriented X4   Cognition Appropriate at baseline  Speech Clear  Musculoskeletal  Musculoskeletal (WDL) X  Assistive Device BSC;Front wheel walker  Generalized Weakness Yes  Weight Bearing Restrictions Yes  LLE Weight Bearing WBAT  Musculoskeletal Details  Left Hip Limited movement  Integumentary  Integumentary (WDL) X  RN Assisting with Skin Assessment on Admission [place]  Skin Color Appropriate for ethnicity  Skin Condition Dry  Skin Integrity Intact;Eczema  Abrasion Location Sacrum  Abrasion Location Orientation Posterior  Abrasion Intervention Foam (present prior to fall)  Eczema Location Sacrum  Eczema Location Orientation Posterior  Eczema Intervention Foam  Pain Assessment  Date Pain First Started 03/02/20  Result of Injury Yes (pt with chronic back pain. Pt states pain is lower than chronic pain)  Pain Assessment  Work-Related Injury No

## 2020-03-03 LAB — RESP PANEL BY RT-PCR (FLU A&B, COVID) ARPGX2
Influenza A by PCR: NEGATIVE
Influenza B by PCR: NEGATIVE
SARS Coronavirus 2 by RT PCR: NEGATIVE

## 2020-03-03 LAB — CBC WITH DIFFERENTIAL/PLATELET
Abs Immature Granulocytes: 0.19 10*3/uL — ABNORMAL HIGH (ref 0.00–0.07)
Basophils Absolute: 0.1 10*3/uL (ref 0.0–0.1)
Basophils Relative: 1 %
Eosinophils Absolute: 0.4 10*3/uL (ref 0.0–0.5)
Eosinophils Relative: 4 %
HCT: 29 % — ABNORMAL LOW (ref 36.0–46.0)
Hemoglobin: 8.8 g/dL — ABNORMAL LOW (ref 12.0–15.0)
Immature Granulocytes: 2 %
Lymphocytes Relative: 21 %
Lymphs Abs: 2 10*3/uL (ref 0.7–4.0)
MCH: 25.2 pg — ABNORMAL LOW (ref 26.0–34.0)
MCHC: 30.3 g/dL (ref 30.0–36.0)
MCV: 83.1 fL (ref 80.0–100.0)
Monocytes Absolute: 0.8 10*3/uL (ref 0.1–1.0)
Monocytes Relative: 8 %
Neutro Abs: 6.3 10*3/uL (ref 1.7–7.7)
Neutrophils Relative %: 64 %
Platelets: 489 10*3/uL — ABNORMAL HIGH (ref 150–400)
RBC: 3.49 MIL/uL — ABNORMAL LOW (ref 3.87–5.11)
RDW: 15.4 % (ref 11.5–15.5)
WBC: 9.8 10*3/uL (ref 4.0–10.5)
nRBC: 0 % (ref 0.0–0.2)

## 2020-03-03 LAB — PHOSPHORUS: Phosphorus: 2.9 mg/dL (ref 2.5–4.6)

## 2020-03-03 LAB — COMPREHENSIVE METABOLIC PANEL
ALT: 20 U/L (ref 0–44)
AST: 17 U/L (ref 15–41)
Albumin: 3 g/dL — ABNORMAL LOW (ref 3.5–5.0)
Alkaline Phosphatase: 56 U/L (ref 38–126)
Anion gap: 12 (ref 5–15)
BUN: 6 mg/dL — ABNORMAL LOW (ref 8–23)
CO2: 20 mmol/L — ABNORMAL LOW (ref 22–32)
Calcium: 9.3 mg/dL (ref 8.9–10.3)
Chloride: 106 mmol/L (ref 98–111)
Creatinine, Ser: 0.63 mg/dL (ref 0.44–1.00)
GFR, Estimated: >60 mL/min (ref >60)
Glucose, Bld: 104 mg/dL — ABNORMAL HIGH (ref 70–99)
Potassium: 3.8 mmol/L (ref 3.5–5.1)
Sodium: 138 mmol/L (ref 135–145)
Total Bilirubin: 0.4 mg/dL (ref 0.3–1.2)
Total Protein: 5.8 g/dL — ABNORMAL LOW (ref 6.5–8.1)

## 2020-03-03 LAB — MAGNESIUM: Magnesium: 1.8 mg/dL (ref 1.7–2.4)

## 2020-03-03 MED ORDER — FE FUMARATE-B12-VIT C-FA-IFC PO CAPS
1.0000 | ORAL_CAPSULE | Freq: Two times a day (BID) | ORAL | Status: DC
  Administered 2020-03-03: 1 via ORAL
  Filled 2020-03-03 (×2): qty 1

## 2020-03-03 MED ORDER — FE FUMARATE-B12-VIT C-FA-IFC PO CAPS: 1.0000 | ORAL_CAPSULE | Freq: Two times a day (BID) | ORAL | Status: DC

## 2020-03-03 MED ORDER — THIAMINE HCL 100 MG PO TABS: 100.0000 mg | ORAL_TABLET | Freq: Every day | ORAL | Status: DC

## 2020-03-03 MED ORDER — MAGNESIUM OXIDE 400 (241.3 MG) MG PO TABS
400.0000 mg | ORAL_TABLET | Freq: Every day | ORAL | Status: DC
  Administered 2020-03-03: 400 mg via ORAL
  Filled 2020-03-03: qty 1

## 2020-03-03 MED ORDER — MAGNESIUM OXIDE 400 (241.3 MG) MG PO TABS: 400.0000 mg | ORAL_TABLET | Freq: Every day | ORAL | Status: DC

## 2020-03-03 NOTE — Care Management Important Message (Signed)
Important Message  Patient Details IM Letter given to the Patient. Name: Victoria Levine MRN: [id number] Date of Birth: 05/22/1950   Medicare Important Message Given:  Yes     Kerin Salen 03/03/2020, 11:13 AM

## 2020-03-03 NOTE — Discharge Summary (Signed)
Physician Discharge Summary  HONEST SAFRANEK OJJ:[id number] DOB: 03/24/1950 DOA: 02/23/2020  PCP: [name], MD  Admit date: 02/23/2020 Discharge date: 03/03/2020  Admitted From: Home Disposition: SNF   Recommendations for Outpatient Follow-up:  1. Follow up with PCP in 1-2 weeks 2. Follow-up with orthopedic 3. Please obtain BMP/CBC in one week 4. Please follow up on the following pending results: None  Home Health: No Equipment/Devices: Rolling walker Discharge Condition: Stable CODE STATUS: Full Diet recommendation: Heart Healthy   Brief/Interim Summary: Evalisse Prajapati Tayloris a 69 y.o.WF PMHx Suicide attempt, EtOH abuse, Bipolar disorder, Herpes, anxiety DO, degenerative disc disease, GERD, asthma, hypothyroidism, osteoporosis presented after having a mechanical fall resulted in left displaced intertrochanteric femoral fracture, s/p total hip arthroplasty by orthopedic. She is being discharged to rehab for further management.  She will continue with DVT prophylaxis for 2 weeks and follow-up with orthopedic surgery for further recommendations.  Had another fall a day before discharge.  Per patient she was trying to get out of the bed to go to bathroom and her hip giveaway.  Repeat CT head and femoral imaging was without any acute changes.  Patient remained stable.  Patient had metabolic encephalopathy on presentation which has been resolved. Patient do have some wandering thoughts and intermittently seems like having some hallucinations which seems chronic.  As she had multiple underlying psych issues which can be addressed by psychiatrist as an outpatient.  She will continue her home psych meds and will follow up with psychiatry as an outpatient. She was on multiple sedating medications on admission and that can contribute to her acute encephalopathy in the beginning, we discontinued some of them and she can continue only oxycodone twice daily as needed for pain.  Patient  has an history of osteoporosis and will continue her Prolia.  Patient has an history of alcohol use, CIWA score remained within normal limit and there is no concern of alcohol withdrawal.  She was started on thiamine supplement and will continue with that.  Patient had multiple electrolyte abnormalities during her current hospitalization which includes hypokalemia, hypomagnesemia and hypophosphatemia which was repleted.  She was also started on magnesium supplement for discharge.>  Patient had anemia of chronic disease which exacerbate with blood loss with surgery.  She will continue with her supplement.  She will continue rest of her home meds and follow-up with her providers.   Discharge Diagnoses:  Principal Problem:   Displaced intertrochanteric fracture of left femur, initial encounter for closed fracture [facility]) Active Problems:   Hypothyroidism   HYPERCHOLESTEROLEMIA   Bipolar disorder (HCC)   Asthma-chronic obstructive pulmonary disease overlap syndrome (HCC)   GERD (gastroesophageal reflux disease)   Osteoporosis   Alcohol abuse   Hypokalemia   Acute metabolic encephalopathy   Discharge Instructions  Discharge Instructions    Diet - low sodium heart healthy   Complete by: As directed    Increase activity slowly   Complete by: As directed    Increase activity slowly   Complete by: As directed    Leave dressing on - Keep it clean, dry, and intact until clinic visit   Complete by: As directed    Leave dressing on - Keep it clean, dry, and intact until clinic visit   Complete by: As directed      Allergies as of 03/03/2020      Reactions   Prednisone Other (See Comments)   Cannot tolerate ORALLY, but can tolerate by shot (interacts with anxiety & bipolarity)  Lithium Carbonate Other (See Comments)   Patient was taken off of this because of a weight gain of 40 pounds   Sulfa Antibiotics Other (See Comments)   From childhood; reaction not recalled      Medication  List    STOP taking these medications   chlordiazePOXIDE 25 MG capsule Commonly known as: LIBRIUM   ferrous sulfate 325 (65 FE) MG tablet   Flovent HFA 110 MCG/ACT inhaler Generic drug: fluticasone Replaced by: budesonide 0.25 MG/2ML nebulizer solution   gabapentin 600 MG tablet Commonly known as: NEURONTIN   methocarbamol 500 MG tablet Commonly known as: ROBAXIN   promethazine 12.5 MG tablet Commonly known as: PHENERGAN     TAKE these medications   albuterol 108 (90 Base) MCG/ACT inhaler Commonly known as: VENTOLIN HFA Inhale 2 puffs into the lungs every 6 (six) hours as needed for wheezing or shortness of breath.   budesonide 0.25 MG/2ML nebulizer solution Commonly known as: PULMICORT Take 2 mLs (0.25 mg total) by nebulization 2 (two) times daily. Replaces: Flovent HFA 110 MCG/ACT inhaler   buPROPion 150 MG 24 hr tablet Commonly known as: WELLBUTRIN XL Take 300 mg by mouth daily.   calcium citrate-vitamin D 315-200 MG-UNIT tablet Commonly known as: Calcium + D Take 1 tablet by mouth daily. For bone health What changed: additional instructions   cyclobenzaprine 10 MG tablet Commonly known as: FLEXERIL Take 10 mg by mouth 3 (three) times daily as needed for muscle spasms.   denosumab 60 MG/ML Soln injection Commonly known as: PROLIA Inject 60 mg into the skin every 6 (six) months. Administer in upper arm, thigh, or abdomen  NEXT IS DUE 03-2017 What changed: additional instructions   dicyclomine 20 MG tablet Commonly known as: BENTYL Take 1 tablet (20 mg total) by mouth every 8 (eight) hours as needed for spasms.   docusate sodium 100 MG capsule Commonly known as: COLACE Take 1 capsule (100 mg total) by mouth 2 (two) times daily.   enoxaparin 40 MG/0.4ML injection Commonly known as: LOVENOX Inject 0.4 mLs (40 mg total) into the skin daily.   EPINEPHrine 0.3 mg/0.3 mL Soaj injection Commonly known as: EPI-PEN Inject 0.3 mg into the skin daily as needed  for anaphylaxis.   famotidine 20 MG tablet Commonly known as: PEPCID Take 1 tablet (20 mg total) by mouth at bedtime.   feeding supplement Liqd Take 237 mLs by mouth daily.   ferrous [id number] C-folic acid capsule Commonly known as: TRINSICON / FOLTRIN Take 1 capsule by mouth 2 (two) times daily after a meal.   FLUoxetine 20 MG capsule Commonly known as: PROZAC Take 20 mg by mouth 2 (two) times daily.   levothyroxine 112 MCG tablet Commonly known as: SYNTHROID Take 112 mcg by mouth daily before breakfast.   magnesium oxide 400 (241.3 Mg) MG tablet Commonly known as: MAG-OX Take 1 tablet (400 mg total) by mouth daily.   multivitamin tablet Take 1 tablet by mouth daily. For low vitamin What changed: additional instructions   naproxen sodium 220 MG tablet Commonly known as: ALEVE Take 220-440 mg by mouth 2 (two) times daily as needed (for pain).   Narcan 4 MG/0.1ML Liqd nasal spray kit Generic drug: naloxone Place 1 spray into the nose once.   ondansetron 4 MG disintegrating tablet Commonly known as: Zofran ODT Take 1 tablet (4 mg total) by mouth every 8 (eight) hours as needed for nausea or vomiting.   oxcarbazepine 600 MG tablet Commonly known as: TRILEPTAL Take 600  mg by mouth daily.   Oxycodone HCl 10 MG Tabs Take 1 tablet (10 mg total) by mouth every 4 (four) hours as needed (for breakthrough pain). What changed: when to take this   pantoprazole 40 MG tablet Commonly known as: PROTONIX Take 1 tablet (40 mg total) by mouth 2 (two) times daily before a meal.   polyethylene glycol 17 g packet Commonly known as: MIRALAX / GLYCOLAX Take 17 g by mouth daily as needed for moderate constipation or severe constipation.   QUEtiapine 100 MG tablet Commonly known as: SEROQUEL Take 1 tablet (100 mg total) by mouth at bedtime as needed (sleep). What changed:   how much to take  when to take this   rizatriptan 10 MG disintegrating tablet Commonly known  as: MAXALT-MLT Take 10 mg by mouth daily as needed for migraine.   sucralfate 1 g tablet Commonly known as: Carafate Take 1 tablet (1 g total) by mouth 4 (four) times daily -  with meals and at bedtime.   thiamine 100 MG tablet Take 1 tablet (100 mg total) by mouth daily.   valACYclovir 500 MG tablet Commonly known as: VALTREX Take one tablet twice daily 3-5 days then daily as needed. What changed:   how much to take  how to take this  when to take this  reasons to take this  additional instructions   Xtampza ER 13.5 MG C12a Generic drug: oxyCODONE ER Take 13.5 mg by mouth 2 (two) times daily.            Discharge Care Instructions  (From admission, onward)         Start     Ordered   03/03/20 0000  Leave dressing on - Keep it clean, dry, and intact until clinic visit        03/03/20 1108   03/02/20 0000  Leave dressing on - Keep it clean, dry, and intact until clinic visit        03/02/20 1454          Contact information for follow-up providers    Swinteck, Aaron Edelman, MD. Schedule an appointment as soon as possible for a visit in 2 weeks.   Specialty: Orthopedic Surgery Why: For wound re-check Contact information: [address] [place] [address] [phone]        [name], MD. Schedule an appointment as soon as possible for a visit.   Specialty: Family Medicine Contact information: [place] [address] [phone]            Contact information for after-discharge care    Destination    [facility] Preferred SNF .   Service: Skilled Nursing Contact information: [address] [place] [address] [phone]                 Allergies  Allergen Reactions  . Prednisone Other (See Comments)    Cannot tolerate ORALLY, but can tolerate by shot (interacts with anxiety & bipolarity)  . Lithium Carbonate Other (See Comments)    Patient was taken  off of this because of a weight gain of 40 pounds  . Sulfa Antibiotics Other (See Comments)    From childhood; reaction not recalled    Consultations:  Orthopedic surgery  Procedures/Studies: DG Chest 1 View  Result Date: 02/23/2020 CLINICAL DATA:  Pain status post fall EXAM: CHEST  1 VIEW COMPARISON:  04/25/2018 FINDINGS: The heart size is enlarged, increased in size from prior  study. There is a large hiatal hernia. The right heart border is indistinct, likely secondary to herniated peritoneal fat abutting the right heart border. The trachea is nearly midline. There is probable atelectasis at the lung bases bilaterally. There is no acute osseous abnormality. No pneumothorax. IMPRESSION: 1. Cardiomegaly. 2. Large hiatal hernia. 3. Bibasilar atelectasis. 4. No acute displaced fracture.  No pneumothorax. 5. Herniated peritoneal fat abutting the right heart border as was visualized on the patient's recent prior CT dated 11/17/2019 Electronically Signed   By: Constance Holster M.D.   On: 02/23/2020 18:48   DG Knee 1-2 Views Left  Result Date: 02/23/2020 CLINICAL DATA:  Pain EXAM: LEFT KNEE - 1-2 VIEW COMPARISON:  None. FINDINGS: No evidence of fracture, dislocation, or joint effusion. Minimal tricompartmental degenerative changes are noted, greatest within the medial and patellofemoral compartments. IMPRESSION: Negative. Electronically Signed   By: Constance Holster M.D.   On: 02/23/2020 18:45   DG Tibia/Fibula Left  Result Date: 02/23/2020 CLINICAL DATA:  Pain status post fall EXAM: LEFT TIBIA AND FIBULA - 2 VIEW COMPARISON:  None. FINDINGS: There is no evidence of fracture or other focal bone lesions. Soft tissues are unremarkable. IMPRESSION: Negative. Electronically Signed   By: Constance Holster M.D.   On: 02/23/2020 18:46   DG Ankle 2 Views Left  Result Date: 02/23/2020 CLINICAL DATA:  Pain status post fall EXAM: LEFT ANKLE - 2 VIEW COMPARISON:  None. FINDINGS: There is no evidence of  fracture, dislocation, or joint effusion. There is no evidence of arthropathy or other focal bone abnormality. Soft tissues are unremarkable. IMPRESSION: Negative. Electronically Signed   By: Constance Holster M.D.   On: 02/23/2020 18:46   CT HEAD WO CONTRAST  Result Date: 03/02/2020 CLINICAL DATA:  Head trauma, no focal neuro findings. Additional history provided: Unwitnessed fall. EXAM: CT HEAD WITHOUT CONTRAST TECHNIQUE: Contiguous axial images were obtained from the base of the skull through the vertex without intravenous contrast. COMPARISON:  Prior head CT examinations 02/23/2020 and earlier. FINDINGS: Brain: Mildly motion degraded exam. Mild cerebral atrophy. Mild ill-defined hypoattenuation within the cerebral white matter is nonspecific, but compatible chronic small vessel ischemic disease. A small remote infarct is again questioned within the superior right cerebellar hemisphere. Redemonstrated 16 mm lobular focus of calcification along the left aspect of the posterior falx likely reflecting a meningioma. There is no acute intracranial hemorrhage. No demarcated cortical infarct. No extra-axial fluid collection. No midline shift. Vascular: No hyperdense vessel.  Atherosclerotic calcifications. Skull: No calvarial fracture. Subcentimeter ground-glass lesion in the paramedian left frontal calvarium demonstrating long-term stability, benign. Sinuses/Orbits: Visualized orbits show no acute finding. Minimal ethmoid sinus mucosal thickening. IMPRESSION: Mildly motion degraded examination. No acute posttraumatic intracranial findings. Mild cerebral atrophy and chronic small vessel ischemic disease. A small chronic infarct is again questioned within the superior right cerebellar hemisphere. Redemonstrated 16 mm lobular focus of calcification along the left aspect of the posterior falx, likely reflecting a meningioma. Minimal ethmoid sinus mucosal thickening Electronically Signed   By: Kellie Simmering DO   On:  03/02/2020 13:10   CT Head Wo Contrast  Result Date: 02/23/2020 CLINICAL DATA:  Head trauma, fall EXAM: CT HEAD WITHOUT CONTRAST TECHNIQUE: Contiguous axial images were obtained from the base of the skull through the vertex without intravenous contrast. COMPARISON:  Head CT 09/25/2018 FINDINGS: Brain: No acute territorial infarction, hemorrhage, or intracranial mass. Mild to moderate atrophy. Small chronic cerebellar infarcts. Mild hypodensity in the white matter consistent with chronic small vessel  ischemic change. Stable ventricle size. Vascular: No hyperdense vessels. Minimal carotid vascular calcification. Skull: No fracture. Stable ground-glass lesion within the left para median frontal skull, benign given lack of interval change. Sinuses/Orbits: No acute finding. Other: Small right frontal scalp swelling. IMPRESSION: 1. No CT evidence for acute intracranial abnormality. 2. Atrophy and mild chronic small vessel ischemic changes of the white matter. Electronically Signed   By: Donavan Foil M.D.   On: 02/23/2020 18:06   Pelvis Portable  Result Date: 02/24/2020 CLINICAL DATA:  69 year old female status post left hip arthroplasty today following posttraumatic fracture. EXAM: PORTABLE PELVIS 1-2 VIEWS COMPARISON:  Intraoperative images 160 1 hours today. FINDINGS: AP view at 1710 hours. Left bipolar hip arthroplasty with intact hardware and normal AP alignment. No unexpected osseous changes. Regional soft tissue gas, overlying skin staples. IMPRESSION: Left bipolar hip arthroplasty with no adverse features. Electronically Signed   By: Genevie Ann M.D.   On: 02/24/2020 19:33   DG C-Arm 1-60 Min-No Report  Result Date: 02/24/2020 Fluoroscopy was utilized by the requesting physician.  No radiographic interpretation.   DG HIP OPERATIVE UNILAT W OR W/O PELVIS LEFT  Result Date: 02/24/2020 CLINICAL DATA:  Left hip arthroplasty EXAM: OPERATIVE left HIP (WITH PELVIS IF PERFORMED) 2 VIEWS TECHNIQUE: Fluoroscopic  spot image(s) were submitted for interpretation post-operatively. COMPARISON:  02/23/2020 FINDINGS: Two low resolution intraoperative spot views of the left hip. Total fluoroscopy time was 10 seconds. The images demonstrate a left hip replacement with intact hardware and normal alignment IMPRESSION: Intraoperative fluoroscopic assistance provided during left hip replacement. Electronically Signed   By: Donavan Foil M.D.   On: 02/24/2020 16:26   DG Hip Unilat W or Wo Pelvis 2-3 Views Left  Result Date: 02/23/2020 CLINICAL DATA:  Pain EXAM: DG HIP (WITH OR WITHOUT PELVIS) 2-3V LEFT COMPARISON:  None. FINDINGS: There is an acute, displaced transcervical fracture of the proximal left femur. There is no dislocation. Mild degenerative changes are noted of both hips. IMPRESSION: Acute, displaced transcervical fracture of the proximal left femur. Electronically Signed   By: Constance Holster M.D.   On: 02/23/2020 18:45   DG FEMUR MIN 2 VIEWS LEFT  Result Date: 03/02/2020 CLINICAL DATA:  Recent fall with left hip pain, initial encounter EXAM: LEFT FEMUR 2 VIEWS COMPARISON:  02/24/2020 FINDINGS: Left hip prosthesis is again identified. No dislocation is seen. No periprosthetic fracture is noted. No soft tissue abnormality is seen. IMPRESSION: Left hip replacement.  No acute abnormality noted. Electronically Signed   By: [name] M.D.   On: 03/02/2020 13:29    Subjective: Patient was having some pain in the left hip today.  She was eager to go to rehab so she can go back home after getting her strength back.  Discharge Exam: Vitals:   03/03/20 0500 03/03/20 0951  BP:    Pulse:    Resp: 12   Temp:    SpO2:  98%   Vitals:   03/03/20 0144 03/03/20 0356 03/03/20 0500 03/03/20 0951  BP: (!) 154/87 (!) 143/77    Pulse:  75    Resp: _0 Temp: 99 F (37.2 C) 98 F (36.7 C)    TempSrc: Oral Oral    SpO2: 97% 94%  98%  Weight:      Height:        General: Pt is alert, awake, not in  acute distress Cardiovascular: RRR, S1/S2 +, no rubs, no gallops Respiratory: CTA bilaterally, no wheezing, no rhonchi  Abdominal: Soft, NT, ND, bowel sounds + Extremities: no edema, no cyanosis   The results of significant diagnostics from this hospitalization (including imaging, microbiology, ancillary and laboratory) are listed below for reference.    Microbiology: Recent Results (from the past 240 hour(s))  Resp Panel by RT-PCR (Flu A&B, Covid) Nasopharyngeal Swab     Status: None   Collection Time: 02/23/20  6:48 PM   Specimen: Nasopharyngeal Swab; Nasopharyngeal(NP) swabs in vial transport medium  Result Value Ref Range Status   SARS Coronavirus 2 by RT PCR NEGATIVE NEGATIVE Final    Comment: (NOTE) SARS-CoV-2 target nucleic acids are NOT DETECTED.  The SARS-CoV-2 RNA is generally detectable in upper respiratory specimens during the acute phase of infection. The lowest concentration of SARS-CoV-2 viral copies this assay can detect is 138 copies/mL. A negative result does not preclude SARS-Cov-2 infection and should not be used as the sole basis for treatment or other patient management decisions. A negative result may occur with  improper specimen collection/handling, submission of specimen other than nasopharyngeal swab, presence of viral mutation(s) within the areas targeted by this assay, and inadequate number of viral copies(<138 copies/mL). A negative result must be combined with clinical observations, patient history, and epidemiological information. The expected result is Negative.  Fact Sheet for Patients:  EntrepreneurPulse.com.au  Fact Sheet for Healthcare Providers:  IncredibleEmployment.be  This test is no t yet approved or cleared by the [place] FDA and  has been authorized for detection and/or diagnosis of SARS-CoV-2 by FDA under an Emergency Use Authorization (EUA). This EUA will remain  in effect (meaning this test  can be used) for the duration of the COVID-19 declaration under Section 564(b)(1) of the Act, 21 U.S.C.section 360bbb-3(b)(1), unless the authorization is terminated  or revoked sooner.       Influenza A by PCR NEGATIVE NEGATIVE Final   Influenza B by PCR NEGATIVE NEGATIVE Final    Comment: (NOTE) The Xpert Xpress SARS-CoV-2/FLU/RSV plus assay is intended as an aid in the diagnosis of influenza from Nasopharyngeal swab specimens and should not be used as a sole basis for treatment. Nasal washings and aspirates are unacceptable for Xpert Xpress SARS-CoV-2/FLU/RSV testing.  Fact Sheet for Patients: EntrepreneurPulse.com.au  Fact Sheet for Healthcare Providers: IncredibleEmployment.be  This test is not yet approved or cleared by the [place] FDA and has been authorized for detection and/or diagnosis of SARS-CoV-2 by FDA under an Emergency Use Authorization (EUA). This EUA will remain in effect (meaning this test can be used) for the duration of the COVID-19 declaration under Section 564(b)(1) of the Act, 21 U.S.C. section 360bbb-3(b)(1), unless the authorization is terminated or revoked.  Performed at [facility], [place] [address], [place], [place] [address]   Surgical pcr screen     Status: None   Collection Time: 02/23/20 11:49 PM   Specimen: Nasal Mucosa; Nasal Swab  Result Value Ref Range Status   MRSA, PCR NEGATIVE NEGATIVE Final   Staphylococcus aureus NEGATIVE NEGATIVE Final    Comment: (NOTE) The Xpert SA Assay (FDA approved for NASAL specimens in patients 48 years of age and older), is one component of a comprehensive surveillance program. It is not intended to diagnose infection nor to guide or monitor treatment. Performed at [facility], [place] [address], [place], [place] [address]   SARS Coronavirus 2 by RT PCR (hospital order, performed in [facility] hospital lab) Nasopharyngeal  Nasopharyngeal Swab     Status: None   Collection Time:  02/26/20 11:20 AM   Specimen: Nasopharyngeal Swab  Result Value Ref Range Status   SARS Coronavirus 2 NEGATIVE NEGATIVE Final    Comment: (NOTE) SARS-CoV-2 target nucleic acids are NOT DETECTED.  The SARS-CoV-2 RNA is generally detectable in upper and lower respiratory specimens during the acute phase of infection. The lowest concentration of SARS-CoV-2 viral copies this assay can detect is 250 copies / mL. A negative result does not preclude SARS-CoV-2 infection and should not be used as the sole basis for treatment or other patient management decisions.  A negative result may occur with improper specimen collection / handling, submission of specimen other than nasopharyngeal swab, presence of viral mutation(s) within the areas targeted by this assay, and inadequate number of viral copies (<250 copies / mL). A negative result must be combined with clinical observations, patient history, and epidemiological information.  Fact Sheet for Patients:   StrictlyIdeas.no  Fact Sheet for Healthcare Providers: BankingDealers.co.za  This test is not yet approved or  cleared by the [place] FDA and has been authorized for detection and/or diagnosis of SARS-CoV-2 by FDA under an Emergency Use Authorization (EUA).  This EUA will remain in effect (meaning this test can be used) for the duration of the COVID-19 declaration under Section 564(b)(1) of the Act, 21 U.S.C. section 360bbb-3(b)(1), unless the authorization is terminated or revoked sooner.  Performed at [facility], [place] [address] [place], [place] [address]   Resp Panel by RT-PCR (Flu A&B, Covid) Nasopharyngeal Swab     Status: None   Collection Time: 03/03/20 12:05 AM   Specimen: Nasopharyngeal Swab; Nasopharyngeal(NP) swabs in vial transport medium  Result Value Ref Range Status   SARS Coronavirus 2 by  RT PCR NEGATIVE NEGATIVE Final    Comment: (NOTE) SARS-CoV-2 target nucleic acids are NOT DETECTED.  The SARS-CoV-2 RNA is generally detectable in upper respiratory specimens during the acute phase of infection. The lowest concentration of SARS-CoV-2 viral copies this assay can detect is 138 copies/mL. A negative result does not preclude SARS-Cov-2 infection and should not be used as the sole basis for treatment or other patient management decisions. A negative result may occur with  improper specimen collection/handling, submission of specimen other than nasopharyngeal swab, presence of viral mutation(s) within the areas targeted by this assay, and inadequate number of viral copies(<138 copies/mL). A negative result must be combined with clinical observations, patient history, and epidemiological information. The expected result is Negative.  Fact Sheet for Patients:  EntrepreneurPulse.com.au  Fact Sheet for Healthcare Providers:  IncredibleEmployment.be  This test is no t yet approved or cleared by the [place] FDA and  has been authorized for detection and/or diagnosis of SARS-CoV-2 by FDA under an Emergency Use Authorization (EUA). This EUA will remain  in effect (meaning this test can be used) for the duration of the COVID-19 declaration under Section 564(b)(1) of the Act, 21 U.S.C.section 360bbb-3(b)(1), unless the authorization is terminated  or revoked sooner.       Influenza A by PCR NEGATIVE NEGATIVE Final   Influenza B by PCR NEGATIVE NEGATIVE Final    Comment: (NOTE) The Xpert Xpress SARS-CoV-2/FLU/RSV plus assay is intended as an aid in the diagnosis of influenza from Nasopharyngeal swab specimens and should not be used as a sole basis for treatment. Nasal washings and aspirates are unacceptable for Xpert Xpress SARS-CoV-2/FLU/RSV testing.  Fact Sheet for Patients: EntrepreneurPulse.com.au  Fact Sheet  for Healthcare Providers: IncredibleEmployment.be  This test is not yet approved or cleared  by the [place] and has been authorized for detection and/or diagnosis of SARS-CoV-2 by FDA under an Emergency Use Authorization (EUA). This EUA will remain in effect (meaning this test can be used) for the duration of the COVID-19 declaration under Section 564(b)(1) of the Act, 21 U.S.C. section 360bbb-3(b)(1), unless the authorization is terminated or revoked.  Performed at [facility], [place] [address] [place], [place] [address]      Labs: BNP (last 3 results) No results for input(s): BNP in the last 8760 hours. Basic Metabolic Panel: Recent Labs  Lab 02/28/20 0447 02/29/20 0822 03/01/20 0419 03/02/20 0451 03/03/20 0449  NA 136 137 138 140 138  K 3.0* 3.4* 3.1* 4.0 3.8  CL 100 102 105 108 106  CO2 _0 20*  GLUCOSE 101* 104* 102* 103* 104*  BUN 7* 8 9 <5* 6*  CREATININE 0.44 0.48 0.57 0.54 0.63  CALCIUM 8.9 9.3 8.8* 9.5 9.3  MG 2.0 1.8 1.6* 2.1 1.8  PHOS 2.8 2.2* 3.2 1.8* 2.9   Liver Function Tests: Recent Labs  Lab 02/28/20 0447 02/29/20 0822 03/01/20 0419 03/02/20 0451 03/03/20 0449  AST _1 ALT _2 ALKPHOS 51 54 44 59 56  BILITOT 0.4 0.5 0.3 0.4 0.4  PROT 5.9* 6.6 5.4* 6.1* 5.8*  ALBUMIN 2.9* 3.3* 2.8* 3.3* 3.0*   No results for input(s): LIPASE, AMYLASE in the last 168 hours. No results for input(s): AMMONIA in the last 168 hours. CBC: Recent Labs  Lab 02/28/20 0447 02/29/20 0822 03/01/20 0419 03/02/20 0451 03/03/20 0449  WBC 8.0 9.1 8.3 9.5 9.8  NEUTROABS 5.2 6.1 4.8 5.7 6.3  HGB 8.6* 9.6* 8.4* 9.1* 8.8*  HCT 28.4* 31.7* 27.2* 30.3* 29.0*  MCV 82.3 82.6 82.9 83.2 83.1  PLT 289 399 400 478* 489*   Cardiac Enzymes: No results for input(s): CKTOTAL, CKMB, CKMBINDEX, TROPONINI in the last 168 hours. BNP: Invalid input(s): POCBNP CBG: No results for input(s): GLUCAP in the  last 168 hours. D-Dimer No results for input(s): DDIMER in the last 72 hours. Hgb A1c No results for input(s): HGBA1C in the last 72 hours. Lipid Profile No results for input(s): CHOL, HDL, LDLCALC, TRIG, CHOLHDL, LDLDIRECT in the last 72 hours. Thyroid function studies No results for input(s): TSH, T4TOTAL, T3FREE, THYROIDAB in the last 72 hours.  Invalid input(s): FREET3 Anemia work up No results for input(s): VITAMINB12, FOLATE, FERRITIN, TIBC, IRON, RETICCTPCT in the last 72 hours. Urinalysis    Component Value Date/Time   COLORURINE STRAW (A) 12/31/2019 0103   APPEARANCEUR CLEAR 12/31/2019 0103   LABSPEC 1.011 12/31/2019 0103   PHURINE 8.0 12/31/2019 0103   GLUCOSEU NEGATIVE 12/31/2019 0103   HGBUR NEGATIVE 12/31/2019 0103   BILIRUBINUR NEGATIVE 12/31/2019 0103   KETONESUR NEGATIVE 12/31/2019 0103   PROTEINUR NEGATIVE 12/31/2019 0103   UROBILINOGEN 0.2 11/12/2013 2233   NITRITE NEGATIVE 12/31/2019 0103   LEUKOCYTESUR NEGATIVE 12/31/2019 0103   Sepsis Labs Invalid input(s): PROCALCITONIN,  WBC,  LACTICIDVEN Microbiology Recent Results (from the past 240 hour(s))  Resp Panel by RT-PCR (Flu A&B, Covid) Nasopharyngeal Swab     Status: None   Collection Time: 02/23/20  6:48 PM   Specimen: Nasopharyngeal Swab; Nasopharyngeal(NP) swabs in vial transport medium  Result Value Ref Range Status   SARS Coronavirus 2 by RT PCR NEGATIVE NEGATIVE Final    Comment: (NOTE) SARS-CoV-2 target nucleic acids are NOT DETECTED.  The SARS-CoV-2 RNA is  generally detectable in upper respiratory specimens during the acute phase of infection. The lowest concentration of SARS-CoV-2 viral copies this assay can detect is 138 copies/mL. A negative result does not preclude SARS-Cov-2 infection and should not be used as the sole basis for treatment or other patient management decisions. A negative result may occur with  improper specimen collection/handling, submission of specimen other than  nasopharyngeal swab, presence of viral mutation(s) within the areas targeted by this assay, and inadequate number of viral copies(<138 copies/mL). A negative result must be combined with clinical observations, patient history, and epidemiological information. The expected result is Negative.  Fact Sheet for Patients:  EntrepreneurPulse.com.au  Fact Sheet for Healthcare Providers:  IncredibleEmployment.be  This test is no t yet approved or cleared by the [place] FDA and  has been authorized for detection and/or diagnosis of SARS-CoV-2 by FDA under an Emergency Use Authorization (EUA). This EUA will remain  in effect (meaning this test can be used) for the duration of the COVID-19 declaration under Section 564(b)(1) of the Act, 21 U.S.C.section 360bbb-3(b)(1), unless the authorization is terminated  or revoked sooner.       Influenza A by PCR NEGATIVE NEGATIVE Final   Influenza B by PCR NEGATIVE NEGATIVE Final    Comment: (NOTE) The Xpert Xpress SARS-CoV-2/FLU/RSV plus assay is intended as an aid in the diagnosis of influenza from Nasopharyngeal swab specimens and should not be used as a sole basis for treatment. Nasal washings and aspirates are unacceptable for Xpert Xpress SARS-CoV-2/FLU/RSV testing.  Fact Sheet for Patients: EntrepreneurPulse.com.au  Fact Sheet for Healthcare Providers: IncredibleEmployment.be  This test is not yet approved or cleared by the [place] FDA and has been authorized for detection and/or diagnosis of SARS-CoV-2 by FDA under an Emergency Use Authorization (EUA). This EUA will remain in effect (meaning this test can be used) for the duration of the COVID-19 declaration under Section 564(b)(1) of the Act, 21 U.S.C. section 360bbb-3(b)(1), unless the authorization is terminated or revoked.  Performed at [facility], [place] [address] [place], [place] [address]   Surgical pcr screen     Status: None   Collection Time: 02/23/20 11:49 PM   Specimen: Nasal Mucosa; Nasal Swab  Result Value Ref Range Status   MRSA, PCR NEGATIVE NEGATIVE Final   Staphylococcus aureus NEGATIVE NEGATIVE Final    Comment: (NOTE) The Xpert SA Assay (FDA approved for NASAL specimens in patients 41 years of age and older), is one component of a comprehensive surveillance program. It is not intended to diagnose infection nor to guide or monitor treatment. Performed at [facility], [place] [address], [place], [place] [address]   SARS Coronavirus 2 by RT PCR (hospital order, performed in [facility] hospital lab) Nasopharyngeal Nasopharyngeal Swab     Status: None   Collection Time: 02/26/20 11:20 AM   Specimen: Nasopharyngeal Swab  Result Value Ref Range Status   SARS Coronavirus 2 NEGATIVE NEGATIVE Final    Comment: (NOTE) SARS-CoV-2 target nucleic acids are NOT DETECTED.  The SARS-CoV-2 RNA is generally detectable in upper and lower respiratory specimens during the acute phase of infection. The lowest concentration of SARS-CoV-2 viral copies this assay can detect is 250 copies / mL. A negative result does not preclude SARS-CoV-2 infection and should not be used as the sole basis for treatment or other patient management decisions.  A negative result may occur with improper specimen collection / handling, submission of specimen other than nasopharyngeal swab, presence  of viral mutation(s) within the areas targeted by this assay, and inadequate number of viral copies (<250 copies / mL). A negative result must be combined with clinical observations, patient history, and epidemiological information.  Fact Sheet for Patients:   StrictlyIdeas.no  Fact Sheet for Healthcare Providers: BankingDealers.co.za  This test is not yet approved or  cleared by the [place] FDA  and has been authorized for detection and/or diagnosis of SARS-CoV-2 by FDA under an Emergency Use Authorization (EUA).  This EUA will remain in effect (meaning this test can be used) for the duration of the COVID-19 declaration under Section 564(b)(1) of the Act, 21 U.S.C. section 360bbb-3(b)(1), unless the authorization is terminated or revoked sooner.  Performed at [facility], [place] [address], [place], [place] [address]   Resp Panel by RT-PCR (Flu A&B, Covid) Nasopharyngeal Swab     Status: None   Collection Time: 03/03/20 12:05 AM   Specimen: Nasopharyngeal Swab; Nasopharyngeal(NP) swabs in vial transport medium  Result Value Ref Range Status   SARS Coronavirus 2 by RT PCR NEGATIVE NEGATIVE Final    Comment: (NOTE) SARS-CoV-2 target nucleic acids are NOT DETECTED.  The SARS-CoV-2 RNA is generally detectable in upper respiratory specimens during the acute phase of infection. The lowest concentration of SARS-CoV-2 viral copies this assay can detect is 138 copies/mL. A negative result does not preclude SARS-Cov-2 infection and should not be used as the sole basis for treatment or other patient management decisions. A negative result may occur with  improper specimen collection/handling, submission of specimen other than nasopharyngeal swab, presence of viral mutation(s) within the areas targeted by this assay, and inadequate number of viral copies(<138 copies/mL). A negative result must be combined with clinical observations, patient history, and epidemiological information. The expected result is Negative.  Fact Sheet for Patients:  EntrepreneurPulse.com.au  Fact Sheet for Healthcare Providers:  IncredibleEmployment.be  This test is no t yet approved or cleared by the [place] FDA and  has been authorized for detection and/or diagnosis of SARS-CoV-2 by FDA under an Emergency Use Authorization (EUA). This EUA will  remain  in effect (meaning this test can be used) for the duration of the COVID-19 declaration under Section 564(b)(1) of the Act, 21 U.S.C.section 360bbb-3(b)(1), unless the authorization is terminated  or revoked sooner.       Influenza A by PCR NEGATIVE NEGATIVE Final   Influenza B by PCR NEGATIVE NEGATIVE Final    Comment: (NOTE) The Xpert Xpress SARS-CoV-2/FLU/RSV plus assay is intended as an aid in the diagnosis of influenza from Nasopharyngeal swab specimens and should not be used as a sole basis for treatment. Nasal washings and aspirates are unacceptable for Xpert Xpress SARS-CoV-2/FLU/RSV testing.  Fact Sheet for Patients: EntrepreneurPulse.com.au  Fact Sheet for Healthcare Providers: IncredibleEmployment.be  This test is not yet approved or cleared by the [place] FDA and has been authorized for detection and/or diagnosis of SARS-CoV-2 by FDA under an Emergency Use Authorization (EUA). This EUA will remain in effect (meaning this test can be used) for the duration of the COVID-19 declaration under Section 564(b)(1) of the Act, 21 U.S.C. section 360bbb-3(b)(1), unless the authorization is terminated or revoked.  Performed at [facility], [place] [address], [place], [place] [address]     Time coordinating discharge: Over 30 minutes  SIGNED:  Lorella Nimrod, MD  [facility] 03/03/2020, 11:09 AM  If 7PM-7AM, please contact night-coverage [email or web address]  This record has been created using Systems analyst.  Errors have been sought and corrected,but may not always be located. Such creation errors do not reflect on the standard of care.

## 2020-03-09 ENCOUNTER — Emergency Department (HOSPITAL_COMMUNITY): Payer: Medicare HMO

## 2020-03-09 ENCOUNTER — Emergency Department (HOSPITAL_COMMUNITY)
Admission: EM | Admit: 2020-03-09 | Discharge: 2020-03-09 | Discharge status: Against Medical Advice | Payer: Medicare HMO | Attending: Emergency Medicine | Admitting: Emergency Medicine

## 2020-03-09 ENCOUNTER — Observation Stay (HOSPITAL_COMMUNITY)
Admission: EM | Admit: 2020-03-09 | Discharge: 2020-03-11 | Disposition: A | Discharge status: Home / Self Care | Payer: Medicare HMO | Attending: Internal Medicine | Admitting: Internal Medicine

## 2020-03-09 DIAGNOSIS — G9340 Encephalopathy, unspecified: Principal | ICD-10-CM | POA: Diagnosis present

## 2020-03-09 DIAGNOSIS — Z96642 Presence of left artificial hip joint: Secondary | ICD-10-CM | POA: Insufficient documentation

## 2020-03-09 DIAGNOSIS — Z20822 Contact with and (suspected) exposure to covid-19: Secondary | ICD-10-CM | POA: Insufficient documentation

## 2020-03-09 DIAGNOSIS — E876 Hypokalemia: Secondary | ICD-10-CM | POA: Diagnosis present

## 2020-03-09 DIAGNOSIS — Z79899 Other long term (current) drug therapy: Secondary | ICD-10-CM | POA: Diagnosis not present

## 2020-03-09 DIAGNOSIS — F319 Bipolar disorder, unspecified: Secondary | ICD-10-CM | POA: Diagnosis present

## 2020-03-09 DIAGNOSIS — R4182 Altered mental status, unspecified: Secondary | ICD-10-CM

## 2020-03-09 DIAGNOSIS — L8990 Pressure ulcer of unspecified site, unspecified stage: Secondary | ICD-10-CM | POA: Insufficient documentation

## 2020-03-09 DIAGNOSIS — J45909 Unspecified asthma, uncomplicated: Secondary | ICD-10-CM | POA: Diagnosis not present

## 2020-03-09 DIAGNOSIS — E039 Hypothyroidism, unspecified: Secondary | ICD-10-CM | POA: Diagnosis not present

## 2020-03-09 DIAGNOSIS — M25552 Pain in left hip: Secondary | ICD-10-CM | POA: Insufficient documentation

## 2020-03-09 LAB — CBC WITH DIFFERENTIAL/PLATELET
Abs Immature Granulocytes: 0.03 10*3/uL (ref 0.00–0.07)
Basophils Absolute: 0.1 10*3/uL (ref 0.0–0.1)
Basophils Relative: 1 %
Eosinophils Absolute: 0.2 10*3/uL (ref 0.0–0.5)
Eosinophils Relative: 2 %
HCT: 32.1 % — ABNORMAL LOW (ref 36.0–46.0)
Hemoglobin: 9.8 g/dL — ABNORMAL LOW (ref 12.0–15.0)
Immature Granulocytes: 0 %
Lymphocytes Relative: 16 %
Lymphs Abs: 1.4 10*3/uL (ref 0.7–4.0)
MCH: 25.5 pg — ABNORMAL LOW (ref 26.0–34.0)
MCHC: 30.5 g/dL (ref 30.0–36.0)
MCV: 83.4 fL (ref 80.0–100.0)
Monocytes Absolute: 0.6 10*3/uL (ref 0.1–1.0)
Monocytes Relative: 6 %
Neutro Abs: 6.5 10*3/uL (ref 1.7–7.7)
Neutrophils Relative %: 75 %
Platelets: 642 10*3/uL — ABNORMAL HIGH (ref 150–400)
RBC: 3.85 MIL/uL — ABNORMAL LOW (ref 3.87–5.11)
RDW: 16.7 % — ABNORMAL HIGH (ref 11.5–15.5)
WBC: 8.7 10*3/uL (ref 4.0–10.5)
nRBC: 0 % (ref 0.0–0.2)

## 2020-03-09 LAB — COMPREHENSIVE METABOLIC PANEL
ALT: 15 U/L (ref 0–44)
ALT: 15 U/L (ref 0–44)
AST: 13 U/L — ABNORMAL LOW (ref 15–41)
AST: 13 U/L — ABNORMAL LOW (ref 15–41)
Albumin: 3.3 g/dL — ABNORMAL LOW (ref 3.5–5.0)
Albumin: 3.2 g/dL — ABNORMAL LOW (ref 3.5–5.0)
Alkaline Phosphatase: 77 U/L (ref 38–126)
Alkaline Phosphatase: 76 U/L (ref 38–126)
Anion gap: 10 (ref 5–15)
Anion gap: 11 (ref 5–15)
BUN: 13 mg/dL (ref 8–23)
BUN: 12 mg/dL (ref 8–23)
CO2: 22 mmol/L (ref 22–32)
CO2: 22 mmol/L (ref 22–32)
Calcium: 9.4 mg/dL (ref 8.9–10.3)
Calcium: 9.5 mg/dL (ref 8.9–10.3)
Chloride: 106 mmol/L (ref 98–111)
Chloride: 107 mmol/L (ref 98–111)
Creatinine, Ser: 0.83 mg/dL (ref 0.44–1.00)
Creatinine, Ser: 0.78 mg/dL (ref 0.44–1.00)
GFR, Estimated: >60 mL/min (ref >60)
GFR, Estimated: >60 mL/min (ref >60)
Glucose, Bld: 132 mg/dL — ABNORMAL HIGH (ref 70–99)
Glucose, Bld: 104 mg/dL — ABNORMAL HIGH (ref 70–99)
Potassium: 3.2 mmol/L — ABNORMAL LOW (ref 3.5–5.1)
Potassium: 3.3 mmol/L — ABNORMAL LOW (ref 3.5–5.1)
Sodium: 138 mmol/L (ref 135–145)
Sodium: 140 mmol/L (ref 135–145)
Total Bilirubin: 0.2 mg/dL — ABNORMAL LOW (ref 0.3–1.2)
Total Bilirubin: 0.7 mg/dL (ref 0.3–1.2)
Total Protein: 6 g/dL — ABNORMAL LOW (ref 6.5–8.1)
Total Protein: 6 g/dL — ABNORMAL LOW (ref 6.5–8.1)

## 2020-03-09 LAB — URINALYSIS, ROUTINE W REFLEX MICROSCOPIC
Bilirubin Urine: NEGATIVE
Glucose, UA: NEGATIVE mg/dL
Hgb urine dipstick: NEGATIVE
Ketones, ur: 5 mg/dL — AB
Leukocytes,Ua: NEGATIVE
Nitrite: NEGATIVE
Protein, ur: NEGATIVE mg/dL
Specific Gravity, Urine: 1.025 (ref 1.005–1.030)
pH: 5 (ref 5.0–8.0)

## 2020-03-09 LAB — CBC
HCT: 32.8 % — ABNORMAL LOW (ref 36.0–46.0)
Hemoglobin: 9.6 g/dL — ABNORMAL LOW (ref 12.0–15.0)
MCH: 24.9 pg — ABNORMAL LOW (ref 26.0–34.0)
MCHC: 29.3 g/dL — ABNORMAL LOW (ref 30.0–36.0)
MCV: 85 fL (ref 80.0–100.0)
Platelets: 655 10*3/uL — ABNORMAL HIGH (ref 150–400)
RBC: 3.86 MIL/uL — ABNORMAL LOW (ref 3.87–5.11)
RDW: 16.3 % — ABNORMAL HIGH (ref 11.5–15.5)
WBC: 9.8 10*3/uL (ref 4.0–10.5)
nRBC: 0 % (ref 0.0–0.2)

## 2020-03-09 LAB — RAPID URINE DRUG SCREEN, HOSP PERFORMED
Amphetamines: NOT DETECTED
Barbiturates: NOT DETECTED
Benzodiazepines: NOT DETECTED
Cocaine: NOT DETECTED
Opiates: NOT DETECTED
Tetrahydrocannabinol: NOT DETECTED

## 2020-03-09 LAB — MAGNESIUM: Magnesium: 1.9 mg/dL (ref 1.7–2.4)

## 2020-03-09 LAB — RESP PANEL BY RT-PCR (FLU A&B, COVID) ARPGX2
Influenza A by PCR: NEGATIVE
Influenza B by PCR: NEGATIVE
SARS Coronavirus 2 by RT PCR: NEGATIVE

## 2020-03-09 LAB — PHOSPHORUS
Phosphorus: 3.2 mg/dL (ref 2.5–4.6)
Phosphorus: 3 mg/dL (ref 2.5–4.6)

## 2020-03-09 LAB — ETHANOL: Alcohol, Ethyl (B): <10 mg/dL (ref <10)

## 2020-03-09 LAB — CBG MONITORING, ED: Glucose-Capillary: 125 mg/dL — ABNORMAL HIGH (ref 70–99)

## 2020-03-09 MED ORDER — POTASSIUM CHLORIDE CRYS ER 20 MEQ PO TBCR
40.0000 meq | EXTENDED_RELEASE_TABLET | Freq: Once | ORAL | Status: AC
  Administered 2020-03-10: 40 meq via ORAL
  Filled 2020-03-09: qty 2

## 2020-03-09 MED ORDER — POLYETHYLENE GLYCOL 3350 17 G PO PACK: 17.0000 g | PACK | Freq: Every day | ORAL | Status: DC | PRN

## 2020-03-09 MED ORDER — OXCARBAZEPINE 300 MG PO TABS
600.0000 mg | ORAL_TABLET | Freq: Every day | ORAL | Status: DC
  Administered 2020-03-09 – 2020-03-11 (×3): 600 mg via ORAL
  Filled 2020-03-09 (×3): qty 2

## 2020-03-09 MED ORDER — LEVOTHYROXINE SODIUM 112 MCG PO TABS
112.0000 ug | ORAL_TABLET | Freq: Every day | ORAL | Status: DC
  Administered 2020-03-10 – 2020-03-11 (×2): 112 ug via ORAL
  Filled 2020-03-09 (×2): qty 1

## 2020-03-09 MED ORDER — THIAMINE HCL 100 MG PO TABS
100.0000 mg | ORAL_TABLET | Freq: Every day | ORAL | Status: DC
  Administered 2020-03-09 – 2020-03-11 (×3): 100 mg via ORAL
  Filled 2020-03-09 (×3): qty 1

## 2020-03-09 MED ORDER — ACETAMINOPHEN 650 MG RE SUPP: 650.0000 mg | Freq: Four times a day (QID) | RECTAL | Status: DC | PRN

## 2020-03-09 MED ORDER — ADULT MULTIVITAMIN W/MINERALS CH
1.0000 | ORAL_TABLET | Freq: Every day | ORAL | Status: DC
  Administered 2020-03-09 – 2020-03-10 (×2): 1 via ORAL
  Filled 2020-03-09 (×3): qty 1

## 2020-03-09 MED ORDER — BUPROPION HCL ER (XL) 150 MG PO TB24
300.0000 mg | ORAL_TABLET | Freq: Every day | ORAL | Status: DC
  Administered 2020-03-09 – 2020-03-11 (×3): 300 mg via ORAL
  Filled 2020-03-09 (×3): qty 2

## 2020-03-09 MED ORDER — DOCUSATE SODIUM 100 MG PO CAPS
100.0000 mg | ORAL_CAPSULE | Freq: Every day | ORAL | Status: DC
  Administered 2020-03-09 – 2020-03-11 (×3): 100 mg via ORAL
  Filled 2020-03-09 (×2): qty 1

## 2020-03-09 MED ORDER — ACETAMINOPHEN 325 MG PO TABS
650.0000 mg | ORAL_TABLET | Freq: Four times a day (QID) | ORAL | Status: DC | PRN
  Administered 2020-03-11: 650 mg via ORAL
  Filled 2020-03-09: qty 2

## 2020-03-09 MED ORDER — ONE-DAILY MULTI VITAMINS PO TABS: 1.0000 | ORAL_TABLET | Freq: Every day | ORAL | Status: DC

## 2020-03-09 MED ORDER — FLUOXETINE HCL 20 MG PO CAPS
20.0000 mg | ORAL_CAPSULE | Freq: Two times a day (BID) | ORAL | Status: DC
  Administered 2020-03-09 – 2020-03-11 (×4): 20 mg via ORAL
  Filled 2020-03-09 (×5): qty 1

## 2020-03-09 MED ORDER — ALBUTEROL SULFATE (2.5 MG/3ML) 0.083% IN NEBU: 2.5000 mg | INHALATION_SOLUTION | RESPIRATORY_TRACT | Status: DC | PRN

## 2020-03-09 MED ORDER — ENOXAPARIN SODIUM 40 MG/0.4ML ~~LOC~~ SOLN
40.0000 mg | SUBCUTANEOUS | Status: DC
  Administered 2020-03-09 – 2020-03-10 (×3): 40 mg via SUBCUTANEOUS
  Filled 2020-03-09 (×2): qty 0.4

## 2020-03-09 MED ORDER — QUETIAPINE FUMARATE 100 MG PO TABS
100.0000 mg | ORAL_TABLET | Freq: Every evening | ORAL | Status: DC | PRN
  Administered 2020-03-09: 100 mg via ORAL
  Filled 2020-03-09: qty 1

## 2020-03-09 MED ORDER — MAGNESIUM OXIDE 400 (241.3 MG) MG PO TABS
400.0000 mg | ORAL_TABLET | Freq: Every day | ORAL | Status: DC
Start: 2020-03-09 — End: 2020-03-11
  Administered 2020-03-09 – 2020-03-11 (×3): 400 mg via ORAL
  Filled 2020-03-09 (×3): qty 1

## 2020-03-09 MED ORDER — ONDANSETRON HCL 4 MG PO TABS: 4.0000 mg | ORAL_TABLET | Freq: Four times a day (QID) | ORAL | Status: DC | PRN

## 2020-03-09 MED ORDER — BUDESONIDE 0.25 MG/2ML IN SUSP
0.2500 mg | Freq: Two times a day (BID) | RESPIRATORY_TRACT | Status: DC
  Administered 2020-03-09 – 2020-03-11 (×4): 0.25 mg via RESPIRATORY_TRACT
  Filled 2020-03-09 (×7): qty 2

## 2020-03-09 MED ORDER — FAMOTIDINE 20 MG PO TABS
20.0000 mg | ORAL_TABLET | Freq: Every day | ORAL | Status: DC
  Administered 2020-03-09 – 2020-03-10 (×3): 20 mg via ORAL
  Filled 2020-03-09 (×3): qty 1

## 2020-03-09 MED ORDER — ONDANSETRON HCL 4 MG/2ML IJ SOLN
4.0000 mg | Freq: Four times a day (QID) | INTRAMUSCULAR | Status: DC | PRN
  Administered 2020-03-10: 4 mg via INTRAVENOUS
  Filled 2020-03-09: qty 2

## 2020-03-09 MED ORDER — QUETIAPINE FUMARATE 100 MG PO TABS
100.0000 mg | ORAL_TABLET | Freq: Every evening | ORAL | Status: DC | PRN
Start: 2020-03-09 — End: 2020-03-09
  Filled 2020-03-09: qty 1

## 2020-03-09 NOTE — ED Triage Notes (Signed)
[]  Hover for details  Pt arrives via [facility] from [facility], pt had femur fracture last week and was a/ox4, Tuesday it was noted that patient had become more confused. Has had multiple falls over the past week, hematoma present to posterior head. Not currently on blood thinners, unsure of LOC with falls since they were no witnessed. 22G IV R hand. EMS VS 99.7, HR 84, RR 24, 12 lead unremarkable

## 2020-03-09 NOTE — ED Provider Notes (Signed)
[facility] Note   CSN: [id number] Arrival date & time: 03/09/20  1235     History No chief complaint on file.   Linna Capricelizabeth A Smeltz is a 69 y.o. female.  HPI Level 5 caveat due to altered mental status.  Reportedly had femur fracture last week.  More confusion for the last couple days.  Reportedly more falls.  Patient is only complaining of pain in the left hip.  Really cannot provide much history however.  Initially registered under the wrong name.  Had some encephalopathy with recent admission the hospital concern stated possibly secondary to polypharmacy.    Past Medical History:  Diagnosis Date  . Anemia    low iron  . Anxiety   . Asthma   . Bipolar disorder (HCC)   . Chronic back pain   . Compression fracture of L2 lumbar vertebra (HCC)   . DDD (degenerative disc disease)   . Depression   . Dyspnea   . GERD (gastroesophageal reflux disease)   . H/O alcohol abuse    28 years sober  . H/O suicide attempt   . Herpes   . History of hiatal hernia   . Hypothyroidism   . Migraines   . Osteoporosis 11/2018   T score of -2.6  . Pneumonia   . PONV (postoperative nausea and vomiting)   . S/P adenoidectomy   . S/P tonsillectomy     Patient Active Problem List   Diagnosis Date Noted  . Acute metabolic encephalopathy 02/27/2020  . Displaced intertrochanteric fracture of left femur, initial encounter for closed fracture (HCC) 02/23/2020  . Hypokalemia 02/23/2020  . Intractable nausea and vomiting 08/31/2019  . Epigastric pain 08/31/2019  . Alcohol abuse 08/31/2019  . Microcytic anemia 08/31/2019  . Closed compression fracture of L2 vertebra (HCC) 12/11/2017  . Pain 07/26/2015  . Anxiety state 06/28/2015  . Benzodiazepine dependence (HCC) 10/13/2014  . Opioid abuse with opioid-induced mood disorder (HCC) 10/13/2014  . Bipolar 1 disorder, depressed (HCC) 10/12/2014  . Spondylolisthesis of lumbar region 07/28/2014  .  Osteoporosis 11/17/2013  . Migraines   . Herpes   . Hypothyroidism 08/13/2008  . HYPERCHOLESTEROLEMIA 08/13/2008  . Bipolar disorder (HCC) 08/13/2008  . Asthma-chronic obstructive pulmonary disease overlap syndrome (HCC) 08/13/2008  . GERD (gastroesophageal reflux disease) 08/13/2008  . HIATAL HERNIA 08/13/2008  . ALLERGY 08/13/2008    Past Surgical History:  Procedure Laterality Date  . CESAREAN SECTION    . COLONOSCOPY     as a teenager  . Compression fracture spine    . DILATION AND CURETTAGE OF UTERUS    . EYE SURGERY Bilateral    CATARACTS REMOVED, NO LENS PLACED  . HYSTEROSCOPY    . KYPHOPLASTY N/A 12/19/2016   Procedure: THORACIC 12 KYPHOPLASTY; REQUEST 60 MINS AND FLIP ROOM;  Surgeon: Estill Bambergumonski, Mark, MD;  Location: [facility] OR;  Service: Orthopedics;  Laterality: N/A;  THORACIC 12 KYPHOPLASTY; REQUEST 60 MINS AND FLIP ROOM  . KYPHOPLASTY N/A 12/11/2017   Procedure: LUMBAR 2 KYPHOPLASTY;  Surgeon: Estill Bambergumonski, Mark, MD;  Location: [facility] OR;  Service: Orthopedics;  Laterality: N/A;  . LUMBAR FUSION    . TONSILLECTOMY    . TOTAL HIP ARTHROPLASTY Left 02/24/2020   Procedure: TOTAL HIP ARTHROPLASTY ANTERIOR APPROACH;  Surgeon: Samson FredericSwinteck, Brian, MD;  Location: [facility] ORS;  Service: Orthopedics;  Laterality: Left;     OB History    Gravida  3   Para  1   Term  1  Preterm      AB  2   Living  1     SAB      IAB      Ectopic      Multiple      Live Births              Family History  Adopted: Yes    Social History   Tobacco Use  . Smoking status: Never Smoker  . Smokeless tobacco: Never Used  Vaping Use  . Vaping Use: Never used  Substance Use Topics  . Alcohol use: Yes    Alcohol/week: 0.0 standard drinks  . Drug use: Not Currently    Types: Benzodiazepines, Cocaine    Home Medications Prior to Admission medications   Medication Sig Start Date End Date Taking? Authorizing Provider  albuterol (PROVENTIL HFA;VENTOLIN HFA) 108 (90 Base) MCG/ACT inhaler  Inhale 2 puffs into the lungs every 6 (six) hours as needed for wheezing or shortness of breath.   Yes [provider]  budesonide (PULMICORT) 0.25 MG/2ML nebulizer solution Take 2 mLs (0.25 mg total) by nebulization 2 (two) times daily. 03/02/20  Yes Lorella Nimrod, MD  buPROPion (WELLBUTRIN XL) 150 MG 24 hr tablet Take 300 mg by mouth daily.  10/01/16  Yes [provider]  calcium citrate-vitamin D (CALCIUM + D) 315-200 MG-UNIT per tablet Take 1 tablet by mouth daily. For bone health Patient taking differently: Take 1 tablet by mouth daily. 10/18/14  Yes Lindell Spar I, NP  cyclobenzaprine (FLEXERIL) 10 MG tablet Take 10 mg by mouth every 8 (eight) hours as needed for muscle spasms.   Yes [provider]  dicyclomine (BENTYL) 20 MG tablet Take 1 tablet (20 mg total) by mouth every 8 (eight) hours as needed for spasms. 12/31/19  Yes Petrucelli, Samantha R, PA-C  docusate sodium (COLACE) 100 MG capsule Take 1 capsule (100 mg total) by mouth 2 (two) times daily. Patient taking differently: Take 100 mg by mouth daily. 03/02/20  Yes Lorella Nimrod, MD  enoxaparin (LOVENOX) 40 MG/0.4ML injection Inject 0.4 mLs (40 mg total) into the skin daily. 02/27/20 03/28/20 Yes Cherlynn June B, PA  EPINEPHrine 0.3 mg/0.3 mL IJ SOAJ injection Inject 0.3 mg into the skin daily as needed for anaphylaxis.  04/10/19  Yes [provider]  famotidine (PEPCID) 20 MG tablet Take 1 tablet (20 mg total) by mouth at bedtime. 09/03/19 02/23/20 Yes Amin, Ankit Chirag, MD  feeding supplement (ENSURE ENLIVE / ENSURE PLUS) LIQD Take 237 mLs by mouth daily. 03/03/20  Yes Lorella Nimrod, MD  Ferrous Fumarate-Folic Acid 99991111 MG TABS Take 1 tablet by mouth 2 (two) times daily after a meal.   Yes [provider]  FLUoxetine (PROZAC) 20 MG capsule Take 20 mg by mouth 2 (two) times daily.    Yes [provider]  levothyroxine (SYNTHROID, LEVOTHROID) 112 MCG tablet Take 112 mcg by mouth daily  before breakfast.  09/04/17  Yes [provider]  magnesium oxide (MAG-OX) 400 (241.3 Mg) MG tablet Take 1 tablet (400 mg total) by mouth daily. 03/03/20  Yes Lorella Nimrod, MD  Multiple Vitamin (MULTIVITAMIN) tablet Take 1 tablet by mouth daily. For low vitamin 10/18/14  Yes Nwoko, Herbert Pun I, NP  naproxen sodium (ALEVE) 220 MG tablet Take 220 mg by mouth every 12 (twelve) hours as needed (pain).   Yes [provider]  ondansetron (ZOFRAN ODT) 4 MG disintegrating tablet Take 1 tablet (4 mg total) by mouth every 8 (eight) hours  as needed for nausea or vomiting. 12/18/19  Yes Joy, Shawn C, PA-C  oxcarbazepine (TRILEPTAL) 600 MG tablet Take 600 mg by mouth daily.    Yes [provider]  oxyCODONE ER 13.5 MG C12A Take 13.5 mg by mouth 2 (two) times daily. 11/22/16  Yes [provider]  Oxycodone HCl 10 MG TABS Take 1 tablet (10 mg total) by mouth every 4 (four) hours as needed (for breakthrough pain). 02/26/20  Yes Cherlynn June B, PA  pantoprazole (PROTONIX) 40 MG tablet Take 1 tablet (40 mg total) by mouth 2 (two) times daily before a meal. 09/03/19  Yes Amin, Ankit Chirag, MD  polyethylene glycol (MIRALAX / GLYCOLAX) 17 g packet Take 17 g by mouth daily as needed for moderate constipation or severe constipation. 09/03/19  Yes Amin, Jeanella Flattery, MD  QUEtiapine (SEROQUEL) 100 MG tablet Take 1 tablet (100 mg total) by mouth at bedtime as needed (sleep). 03/02/20  Yes Lorella Nimrod, MD  rizatriptan (MAXALT-MLT) 10 MG disintegrating tablet Take 10 mg by mouth daily as needed for migraine.  11/08/14  Yes [provider]  sucralfate (CARAFATE) 1 g tablet Take 1 tablet (1 g total) by mouth 4 (four) times daily -  with meals and at bedtime. 12/18/19 02/23/20 Yes Joy, Shawn C, PA-C  thiamine 100 MG tablet Take 1 tablet (100 mg total) by mouth daily. 03/03/20  Yes Lorella Nimrod, MD  valACYclovir (VALTREX) 500 MG tablet Take one tablet twice daily 3-5 days then daily as  needed. Patient taking differently: Take by mouth See admin instructions. Take 1 tablet (500mg ) by mouth twice daily for 3-5 days then daily as needed for herpes virus infection. 05/01/17  Yes [name], NP  denosumab (PROLIA) 60 MG/ML SOLN injection Inject 60 mg into the skin every 6 (six) months. Administer in upper arm, thigh, or abdomen  NEXT IS DUE 03-2017 Patient not taking: Reported on 03/09/2020 03/05/17   [name], NP  ferrous [id number] C-folic acid (TRINSICON / FOLTRIN) capsule Take 1 capsule by mouth 2 (two) times daily after a meal. Patient not taking: Reported on 03/09/2020 03/03/20   Lorella Nimrod, MD    Allergies    Prednisone, Lithium carbonate, and Sulfa antibiotics  Review of Systems   Review of Systems  Unable to perform ROS: Mental status change    Physical Exam Updated Vital Signs BP (!) 147/88   Pulse 75   Temp 99.2 F (37.3 C) (Oral)   Resp 18   Ht 5\' 7"  (1.702 m)   Wt 76.2 kg   SpO2 97%   BMI 26.31 kg/m   Physical Exam Vitals and nursing note reviewed.  HENT:     Head: Normocephalic.     Right Ear: External ear normal.     Left Ear: External ear normal.     Mouth/Throat:     Mouth: Mucous membranes are moist.  Eyes:     Extraocular Movements: Extraocular movements intact.     Pupils: Pupils are equal, round, and reactive to light.  Cardiovascular:     Rate and Rhythm: Regular rhythm.  Pulmonary:     Breath sounds: No wheezing or rhonchi.  Abdominal:     Tenderness: There is no abdominal tenderness.  Musculoskeletal:     Cervical back: Neck supple.     Comments: Mild tenderness to left hip.  Skin:    Capillary Refill: Capillary refill takes less than 2 seconds.  Neurological:     Mental Status: She is  alert.     Comments: Awake but some confusion.  Able to tell me her name but not much more of what happened.     ED Results / Procedures / Treatments   Labs (all labs ordered are listed, but only abnormal results are  displayed) Labs Reviewed  COMPREHENSIVE METABOLIC PANEL - Abnormal; Notable for the following components:      Result Value   Potassium 3.3 (*)    Glucose, Bld 104 (*)    Total Protein 6.0 (*)    Albumin 3.2 (*)    AST 13 (*)    All other components within normal limits  CBC WITH DIFFERENTIAL/PLATELET - Abnormal; Notable for the following components:   RBC 3.85 (*)    Hemoglobin 9.8 (*)    HCT 32.1 (*)    MCH 25.5 (*)    RDW 16.7 (*)    Platelets 642 (*)    All other components within normal limits  RESP PANEL BY RT-PCR (FLU A&B, COVID) ARPGX2  ETHANOL  RAPID URINE DRUG SCREEN, HOSP PERFORMED  URINALYSIS, ROUTINE W REFLEX MICROSCOPIC    EKG EKG Interpretation  Date/Time:  Wednesday March 09 2020 15:00:55 EST Ventricular Rate:  76 PR Interval:    QRS Duration: 99 QT Interval:  390 QTC Calculation: 439 R Axis:   90 Text Interpretation: Sinus rhythm Consider right ventricular hypertrophy Borderline T abnormalities, anterior leads Confirmed by Dene Gentry [id number]) on 03/09/2020 4:05:39 PM   Radiology DG Chest 1 View  Result Date: 03/09/2020 CLINICAL DATA:  Altered mental status. EXAM: CHEST  1 VIEW COMPARISON:  February 23, 2020 chest radiograph; CT abdomen with conclusion in lung bases November 17, 2019 FINDINGS: There is atelectatic change in the left mid lung. There is no evident edema or airspace opacity. There is stable cardiac prominence. Opacity adjacent to the right heart border is stable and is demonstrated by CT to demonstrate herniated peritoneal fat abutting the right heart border. No adenopathy. No bone lesions. IMPRESSION: Atelectatic change left mid lung. Lungs elsewhere clear. Stable cardiac prominence. Soft tissue prominence adjacent to the right heart border consistent with fatty tissue herniated from the peritoneum, demonstrated on prior CT and stable in appearance. Electronically Signed   By: Lowella Grip III M.D.   On: 03/09/2020 13:38   CT Head Wo  Contrast  Result Date: 03/09/2020 CLINICAL DATA:  Head trauma EXAM: CT HEAD WITHOUT CONTRAST TECHNIQUE: Contiguous axial images were obtained from the base of the skull through the vertex without intravenous contrast. COMPARISON:  March 02, 2020 FINDINGS: Brain: No evidence of acute territorial infarction, hemorrhage, hydrocephalus,extra-axial collection or mass lesion/mass effect. There is dilatation the ventricles and sulci consistent with age-related atrophy. Low-attenuation changes in the deep white matter consistent with small vessel ischemia. Tiny calcific density overlying the posterior falx, likely meningioma. Vascular: No hyperdense vessel or unexpected calcification. Skull: The skull is intact. No fracture or focal lesion identified. Sinuses/Orbits: Mucosal thickening seen of the ethmoid air cells. The orbits and globes intact. Other: None IMPRESSION: No acute intracranial abnormality. Findings consistent with age related atrophy and chronic small vessel ischemia Electronically Signed   By: Prudencio Pair M.D.   On: 03/09/2020 14:05   DG Hip Unilat W or Wo Pelvis 2-3 Views Left  Result Date: 03/09/2020 CLINICAL DATA:  Left hip pain, recent hip arthroplasty EXAM: DG HIP (WITH OR WITHOUT PELVIS) 2-3V LEFT COMPARISON:  03/02/2020 FINDINGS: Interval postsurgical changes from left total hip arthroplasty. Arthroplasty components are in their expected alignment.  No periprosthetic fracture identified. Pelvic bony ring intact. Partially visualized lower lumbar fusion hardware. Surgical staples overlie the lateral aspect of the left hip. IMPRESSION: Left total hip arthroplasty without evidence of complication. Electronically Signed   By: Davina Poke D.O.   On: 03/09/2020 13:42    Procedures Procedures (including critical care time)  Medications Ordered in ED Medications - No data to display  ED Course  I have reviewed the triage vital signs and the nursing notes.  Pertinent labs & imaging  results that were available during my care of the patient were reviewed by me and considered in my medical decision making (see chart for details).    MDM Rules/Calculators/A&P                         Patient brought in with mental status change.  Initially brought in and registered under the wrong name.  However once that was corrected I have compared old records.  Recent left hip replacement.  With that had some encephalopathy thought potentially due to polypharmacy.  Now worsening mental status.  Head CT reassuring.  Is on several different medications.  Labs reviewed.  Pending on urine.  Likely require admission to the hospital for worsening encephalopathy.  Care turned over to Dr. Francia Greaves. Final Clinical Impression(s) / ED Diagnoses Final diagnoses:  Encephalopathy    Rx / DC Orders ED Discharge Orders    None       Davonna Belling, MD 03/09/20 1627

## 2020-03-09 NOTE — ED Triage Notes (Signed)
Pt arrives via [facility] from [facility], pt had femur fracture last week and was a/ox4, Tuesday it was noted that patient had become more confused. Has had multiple falls over the past week, hematoma present to posterior head. Not currently on blood thinners, unsure of LOC with falls since they were no witnessed. 22G IV R hand. EMS VS 99.7, HR 84, RR 24, 12 lead unremarkable.
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
## 2020-03-09 NOTE — ED Notes (Signed)
Called pt x2 for room, no response. 

## 2020-03-09 NOTE — H&P (Signed)
History and Physical  Patient Name: Victoria Levine     [id number]    DOB: 07/12/1950    DOA: 03/09/2020 PCP: [name], MD  Patient coming from: [facility]  Chief Complaint: Worsening encephalopathy      HPI: Victoria Levine is a 69 y.o. female with PMHx Suicide attempt, EtOH abuse, Bipolar disorder, Herpes, anxiety DO, degenerative disc disease, GERD, asthma, hypothyroidism, osteoporosis, recent left hip fracture, who presented from SNF on 03/09/2020 secondary to worsening encephalopathy over the past few days.  At the time my exam, patient is alert but encephalopathic.  She is unsure why she is here.  Thus a complete and thorough HPI review of systems unable be obtained.  Apparently over the past few days her rehab facility, she has had worsening encephalopathy.  Abnormal thoughts and confusion.  She has a history of bipolar.  She was recently hospitalized a few weeks ago for a left femoral fracture.  On initial presentation then, she had some encephalopathy which was thought to be secondary to polypharmacy.  On discharge she had gabapentin, Robaxin, promethazine, and Librium discontinued  ED course: -Vitals on admission, unremarkable -Relevant labs on admission: Sodium 140, potassium 3.3, chloride 107, bicarb 22, glucose 114, BUN 12, creatinine 0.78, WBC 8.7, hemoglobin 9.8, platelets 642, Covid negative, urine drug screen unremarkable, UA unimpressive. -No infectious etiology determined -The hospitalist service was contacted for further evaluation and monitoring given patient's encephalopathy     ROS: Review of Systems  Unable to perform ROS: Psychiatric disorder          Past Medical History:  Diagnosis Date  . Anemia    low iron  . Anxiety   . Asthma   . Bipolar disorder ([place])   . Chronic back pain   . Compression fracture of L2 lumbar vertebra (HCC)   . DDD (degenerative disc disease)   . Depression   . Dyspnea   . GERD (gastroesophageal reflux  disease)   . H/O alcohol abuse    28 years sober  . H/O suicide attempt   . Herpes   . History of hiatal hernia   . Hypothyroidism   . Migraines   . Osteoporosis 11/2018   T score of -2.6  . Pneumonia   . PONV (postoperative nausea and vomiting)   . S/P adenoidectomy   . S/P tonsillectomy     Past Surgical History:  Procedure Laterality Date  . CESAREAN SECTION    . COLONOSCOPY     as a teenager  . Compression fracture spine    . DILATION AND CURETTAGE OF UTERUS    . EYE SURGERY Bilateral    CATARACTS REMOVED, NO LENS PLACED  . HYSTEROSCOPY    . KYPHOPLASTY N/A 12/19/2016   Procedure: THORACIC 12 KYPHOPLASTY; REQUEST 60 MINS AND FLIP ROOM;  Surgeon: Phylliss Bob, MD;  Location: [facility];  Service: Orthopedics;  Laterality: N/A;  THORACIC 12 KYPHOPLASTY; REQUEST 60 MINS AND FLIP ROOM  . KYPHOPLASTY N/A 12/11/2017   Procedure: LUMBAR 2 KYPHOPLASTY;  Surgeon: Phylliss Bob, MD;  Location: [facility];  Service: Orthopedics;  Laterality: N/A;  . LUMBAR FUSION    . TONSILLECTOMY    . TOTAL HIP ARTHROPLASTY Left 02/24/2020   Procedure: TOTAL HIP ARTHROPLASTY ANTERIOR APPROACH;  Surgeon: Rod Can, MD;  Location: [facility];  Service: Orthopedics;  Laterality: Left;    Social History: Patient lives at facility.  The patient walks with walker.  Former smoker.  Allergies  Allergen Reactions  . Prednisone  Other (See Comments)    Cannot tolerate ORALLY, but can tolerate by shot (interacts with anxiety & bipolarity)  . Lithium Carbonate Other (See Comments)    Patient was taken off of this because of a weight gain of 40 pounds  . Sulfa Antibiotics Other (See Comments)    From childhood; reaction not recalled    Family history: family history is not on file. She was adopted.  Prior to Admission medications   Medication Sig Start Date End Date Taking? Authorizing Provider  albuterol (PROVENTIL HFA;VENTOLIN HFA) 108 (90 Base) MCG/ACT inhaler Inhale 2 puffs into the lungs every 6 (six)  hours as needed for wheezing or shortness of breath.   Yes [provider]  budesonide (PULMICORT) 0.25 MG/2ML nebulizer solution Take 2 mLs (0.25 mg total) by nebulization 2 (two) times daily. 03/02/20  Yes Lorella Nimrod, MD  buPROPion (WELLBUTRIN XL) 150 MG 24 hr tablet Take 300 mg by mouth daily.  10/01/16  Yes [provider]  calcium citrate-vitamin D (CALCIUM + D) 315-200 MG-UNIT per tablet Take 1 tablet by mouth daily. For bone health Patient taking differently: Take 1 tablet by mouth daily. 10/18/14  Yes Lindell Spar I, NP  cyclobenzaprine (FLEXERIL) 10 MG tablet Take 10 mg by mouth every 8 (eight) hours as needed for muscle spasms.   Yes [provider]  dicyclomine (BENTYL) 20 MG tablet Take 1 tablet (20 mg total) by mouth every 8 (eight) hours as needed for spasms. 12/31/19  Yes Petrucelli, Samantha R, PA-C  docusate sodium (COLACE) 100 MG capsule Take 1 capsule (100 mg total) by mouth 2 (two) times daily. Patient taking differently: Take 100 mg by mouth daily. 03/02/20  Yes Lorella Nimrod, MD  enoxaparin (LOVENOX) 40 MG/0.4ML injection Inject 0.4 mLs (40 mg total) into the skin daily. 02/27/20 03/28/20 Yes Cherlynn June B, PA  EPINEPHrine 0.3 mg/0.3 mL IJ SOAJ injection Inject 0.3 mg into the skin daily as needed for anaphylaxis.  04/10/19  Yes [provider]  famotidine (PEPCID) 20 MG tablet Take 1 tablet (20 mg total) by mouth at bedtime. 09/03/19 02/23/20 Yes Amin, Ankit Chirag, MD  feeding supplement (ENSURE ENLIVE / ENSURE PLUS) LIQD Take 237 mLs by mouth daily. 03/03/20  Yes Lorella Nimrod, MD  Ferrous Fumarate-Folic Acid 99991111 MG TABS Take 1 tablet by mouth 2 (two) times daily after a meal.   Yes [provider]  FLUoxetine (PROZAC) 20 MG capsule Take 20 mg by mouth 2 (two) times daily.    Yes [provider]  levothyroxine (SYNTHROID, LEVOTHROID) 112 MCG tablet Take 112 mcg by mouth daily before breakfast.  09/04/17  Yes [provider]  magnesium oxide (MAG-OX) 400 (241.3 Mg) MG tablet Take 1 tablet (400 mg total) by mouth daily. 03/03/20  Yes Lorella Nimrod, MD  Multiple Vitamin (MULTIVITAMIN) tablet Take 1 tablet by mouth daily. For low vitamin 10/18/14  Yes Nwoko, Herbert Pun I, NP  naproxen sodium (ALEVE) 220 MG tablet Take 220 mg by mouth every 12 (twelve) hours as needed (pain).   Yes [provider]  ondansetron (ZOFRAN ODT) 4 MG disintegrating tablet Take 1 tablet (4 mg total) by mouth every 8 (eight) hours as needed for nausea or vomiting. 12/18/19  Yes Joy, Shawn C, PA-C  oxcarbazepine (TRILEPTAL) 600 MG tablet Take 600 mg by mouth daily.    Yes [provider]  oxyCODONE ER 13.5 MG C12A Take 13.5 mg by mouth 2 (two) times daily. 11/22/16  Yes [provider]  Oxycodone HCl 10 MG TABS Take 1 tablet (10 mg total) by mouth every 4 (four) hours as needed (for breakthrough pain). 02/26/20  Yes Cherlynn June B, PA  pantoprazole (PROTONIX) 40 MG tablet Take 1 tablet (40 mg total) by mouth 2 (two) times daily before a meal. 09/03/19  Yes Amin, Ankit Chirag, MD  polyethylene glycol (MIRALAX / GLYCOLAX) 17 g packet Take 17 g by mouth daily as needed for moderate constipation or severe constipation. 09/03/19  Yes Amin, Jeanella Flattery, MD  QUEtiapine (SEROQUEL) 100 MG tablet Take 1 tablet (100 mg total) by mouth at bedtime as needed (sleep). 03/02/20  Yes Lorella Nimrod, MD  rizatriptan (MAXALT-MLT) 10 MG disintegrating tablet Take 10 mg by mouth daily as needed for migraine.  11/08/14  Yes [provider]  sucralfate (CARAFATE) 1 g tablet Take 1 tablet (1 g total) by mouth 4 (four) times daily -  with meals and at bedtime. 12/18/19 02/23/20 Yes Joy, Shawn C, PA-C  thiamine 100 MG tablet Take 1 tablet (100 mg total) by mouth daily. 03/03/20  Yes Lorella Nimrod, MD  valACYclovir (VALTREX) 500 MG tablet Take one tablet twice daily 3-5 days then daily as needed. Patient taking differently: Take by mouth  See admin instructions. Take 1 tablet (500mg ) by mouth twice daily for 3-5 days then daily as needed for herpes virus infection. 05/01/17  Yes [name], NP  denosumab (PROLIA) 60 MG/ML SOLN injection Inject 60 mg into the skin every 6 (six) months. Administer in upper arm, thigh, or abdomen  NEXT IS DUE 03-2017 Patient not taking: Reported on 03/09/2020 03/05/17   [name], NP       Physical Exam: BP (!) 160/89 (BP Location: Left Arm)   Pulse 86   Temp 99.4 F (37.4 C) (Oral)   Resp 19   Ht 5\' 7"  (1.702 m)   Wt 76.2 kg   SpO2 97%   BMI 26.31 kg/m  General appearance: Well-developed, adult female, alert and in no acute distress .   Eyes: Anicteric, conjunctiva pink, lids and lashes normal. PERRL.    ENT: No nasal deformity, discharge, epistaxis.  Hearing intact. OP moist without lesions.   Neck: No neck masses.  Trachea midline.  No thyromegaly/tenderness. Lymph: No cervical or supraclavicular lymphadenopathy. Skin: Warm and dry.  No jaundice.  No suspicious rashes or lesions. Cardiac: RRR, nl S1-S2, no murmurs appreciated.  Capillary refill is brisk.  No LE edema.  Radial and pedal pulses 2+ and symmetric. Respiratory: Normal respiratory rate and rhythm.  CTAB without rales or wheezes. Abdomen: Abdomen soft.. No ascites, distension, hepatosplenomegaly.   MSK: No deformities or effusions of the large joints of the upper or lower extremities bilaterally.  No cyanosis or clubbing. Neuro: Cranial nerves 2 through 12 grossly intact.  Sensation intact to light touch. Speech is fluent.Marland Kitchen    Psych: Abnormal thought processes.  Tangential thoughts.  Alert x2 however lack of insight.  No suicide ideation     Labs on Admission:  I have personally reviewed following labs and imaging studies: CBC: Recent Labs  Lab 03/03/20 0449 03/09/20 1026 03/09/20 1358  WBC 9.8 9.8 8.7  NEUTROABS 6.3  --  6.5  HGB 8.8* 9.6* 9.8*  HCT 29.0* 32.8* 32.1*  MCV 83.1 85.0 83.4  PLT 489* 655*  [id number]*   Basic Metabolic Panel: Recent Labs  Lab 03/03/20 0449 03/09/20 1026 03/09/20 1358 03/09/20 1655  NA 138 138 140  --   K  3.8 3.2* 3.3*  --   CL 106 106 107  --   CO2 20* 22 22  --   GLUCOSE 104* 132* 104*  --   BUN 6* 13 12  --   CREATININE 0.63 0.83 0.78  --   CALCIUM 9.3 9.4 9.5  --   MG 1.8  --   --  1.9  PHOS 2.9  --   --  3.2   GFR: Estimated Creatinine Clearance: 68.6 mL/min (by C-G formula based on SCr of 0.78 mg/dL).  Liver Function Tests: Recent Labs  Lab 03/03/20 0449 03/09/20 1026 03/09/20 1358  AST 17 13* 13*  ALT 20 15 15   ALKPHOS 56 77 76  BILITOT 0.4 0.2* 0.7  PROT 5.8* 6.0* 6.0*  ALBUMIN 3.0* 3.3* 3.2*   No results for input(s): LIPASE, AMYLASE in the last 168 hours. No results for input(s): AMMONIA in the last 168 hours. Coagulation Profile: No results for input(s): INR, PROTIME in the last 168 hours. Cardiac Enzymes: No results for input(s): CKTOTAL, CKMB, CKMBINDEX, TROPONINI in the last 168 hours. BNP (last 3 results) No results for input(s): PROBNP in the last 8760 hours. HbA1C: No results for input(s): HGBA1C in the last 72 hours. CBG: Recent Labs  Lab 03/09/20 1032  GLUCAP 125*   Lipid Profile: No results for input(s): CHOL, HDL, LDLCALC, TRIG, CHOLHDL, LDLDIRECT in the last 72 hours. Thyroid Function Tests: No results for input(s): TSH, T4TOTAL, FREET4, T3FREE, THYROIDAB in the last 72 hours. Anemia Panel: No results for input(s): VITAMINB12, FOLATE, FERRITIN, TIBC, IRON, RETICCTPCT in the last 72 hours.  Recent Results (from the past 240 hour(s))  Resp Panel by RT-PCR (Flu A&B, Covid) Nasopharyngeal Swab     Status: None   Collection Time: 03/03/20 12:05 AM   Specimen: Nasopharyngeal Swab; Nasopharyngeal(NP) swabs in vial transport medium  Result Value Ref Range Status   SARS Coronavirus 2 by RT PCR NEGATIVE NEGATIVE Final    Comment: (NOTE) SARS-CoV-2 target nucleic acids are NOT DETECTED.  The SARS-CoV-2 RNA is  generally detectable in upper respiratory specimens during the acute phase of infection. The lowest concentration of SARS-CoV-2 viral copies this assay can detect is 138 copies/mL. A negative result does not preclude SARS-Cov-2 infection and should not be used as the sole basis for treatment or other patient management decisions. A negative result may occur with  improper specimen collection/handling, submission of specimen other than nasopharyngeal swab, presence of viral mutation(s) within the areas targeted by this assay, and inadequate number of viral copies(<138 copies/mL). A negative result must be combined with clinical observations, patient history, and epidemiological information. The expected result is Negative.  Fact Sheet for Patients:  EntrepreneurPulse.com.au  Fact Sheet for Healthcare Providers:  IncredibleEmployment.be  This test is no t yet approved or cleared by the [place] FDA and  has been authorized for detection and/or diagnosis of SARS-CoV-2 by FDA under an Emergency Use Authorization (EUA). This EUA will remain  in effect (meaning this test can be used) for the duration of the COVID-19 declaration under Section 564(b)(1) of the Act, 21 U.S.C.section 360bbb-3(b)(1), unless the authorization is terminated  or revoked sooner.       Influenza A by PCR NEGATIVE NEGATIVE Final   Influenza B by PCR NEGATIVE NEGATIVE Final    Comment: (NOTE) The Xpert Xpress SARS-CoV-2/FLU/RSV plus assay is intended as an aid in the diagnosis of influenza from Nasopharyngeal swab specimens and should not be used as a sole basis for treatment.  Nasal washings and aspirates are unacceptable for Xpert Xpress SARS-CoV-2/FLU/RSV testing.  Fact Sheet for Patients: EntrepreneurPulse.com.au  Fact Sheet for Healthcare Providers: IncredibleEmployment.be  This test is not yet approved or cleared by the [place] FDA and has been authorized for detection and/or diagnosis of SARS-CoV-2 by FDA under an Emergency Use Authorization (EUA). This EUA will remain in effect (meaning this test can be used) for the duration of the COVID-19 declaration under Section 564(b)(1) of the Act, 21 U.S.C. section 360bbb-3(b)(1), unless the authorization is terminated or revoked.  Performed at [facility], [place] [address], [place], [place] [address]   Resp Panel by RT-PCR (Flu A&B, Covid) Nasopharyngeal Swab     Status: None   Collection Time: 03/09/20  2:27 PM   Specimen: Nasopharyngeal Swab; Nasopharyngeal(NP) swabs in vial transport medium  Result Value Ref Range Status   SARS Coronavirus 2 by RT PCR NEGATIVE NEGATIVE Final    Comment: (NOTE) SARS-CoV-2 target nucleic acids are NOT DETECTED.  The SARS-CoV-2 RNA is generally detectable in upper respiratory specimens during the acute phase of infection. The lowest concentration of SARS-CoV-2 viral copies this assay can detect is 138 copies/mL. A negative result does not preclude SARS-Cov-2 infection and should not be used as the sole basis for treatment or other patient management decisions. A negative result may occur with  improper specimen collection/handling, submission of specimen other than nasopharyngeal swab, presence of viral mutation(s) within the areas targeted by this assay, and inadequate number of viral copies(<138 copies/mL). A negative result must be combined with clinical observations, patient history, and epidemiological information. The expected result is Negative.  Fact Sheet for Patients:  EntrepreneurPulse.com.au  Fact Sheet for Healthcare Providers:  IncredibleEmployment.be  This test is no t yet approved or cleared by the [place] FDA and  has been authorized for detection and/or diagnosis of SARS-CoV-2 by FDA under an Emergency Use Authorization (EUA). This EUA will  remain  in effect (meaning this test can be used) for the duration of the COVID-19 declaration under Section 564(b)(1) of the Act, 21 U.S.C.section 360bbb-3(b)(1), unless the authorization is terminated  or revoked sooner.       Influenza A by PCR NEGATIVE NEGATIVE Final   Influenza B by PCR NEGATIVE NEGATIVE Final    Comment: (NOTE) The Xpert Xpress SARS-CoV-2/FLU/RSV plus assay is intended as an aid in the diagnosis of influenza from Nasopharyngeal swab specimens and should not be used as a sole basis for treatment. Nasal washings and aspirates are unacceptable for Xpert Xpress SARS-CoV-2/FLU/RSV testing.  Fact Sheet for Patients: EntrepreneurPulse.com.au  Fact Sheet for Healthcare Providers: IncredibleEmployment.be  This test is not yet approved or cleared by the [place] FDA and has been authorized for detection and/or diagnosis of SARS-CoV-2 by FDA under an Emergency Use Authorization (EUA). This EUA will remain in effect (meaning this test can be used) for the duration of the COVID-19 declaration under Section 564(b)(1) of the Act, 21 U.S.C. section 360bbb-3(b)(1), unless the authorization is terminated or revoked.  Performed at [facility], [place] [address] [place], [place] [address]            Radiological Exams on Admission: Personally reviewed no acute processes noted on chest x-ray or CT head: DG Chest 1 View  Result Date: 03/09/2020 CLINICAL DATA:  Altered mental status. EXAM: CHEST  1 VIEW COMPARISON:  February 23, 2020 chest radiograph; CT abdomen with conclusion in lung bases November 17, 2019 FINDINGS: There is atelectatic  change in the left mid lung. There is no evident edema or airspace opacity. There is stable cardiac prominence. Opacity adjacent to the right heart border is stable and is demonstrated by CT to demonstrate herniated peritoneal fat abutting the right heart border. No adenopathy. No bone lesions.  IMPRESSION: Atelectatic change left mid lung. Lungs elsewhere clear. Stable cardiac prominence. Soft tissue prominence adjacent to the right heart border consistent with fatty tissue herniated from the peritoneum, demonstrated on prior CT and stable in appearance. Electronically Signed   By: Lowella Grip III M.D.   On: 03/09/2020 13:38   CT Head Wo Contrast  Result Date: 03/09/2020 CLINICAL DATA:  Head trauma EXAM: CT HEAD WITHOUT CONTRAST TECHNIQUE: Contiguous axial images were obtained from the base of the skull through the vertex without intravenous contrast. COMPARISON:  March 02, 2020 FINDINGS: Brain: No evidence of acute territorial infarction, hemorrhage, hydrocephalus,extra-axial collection or mass lesion/mass effect. There is dilatation the ventricles and sulci consistent with age-related atrophy. Low-attenuation changes in the deep white matter consistent with small vessel ischemia. Tiny calcific density overlying the posterior falx, likely meningioma. Vascular: No hyperdense vessel or unexpected calcification. Skull: The skull is intact. No fracture or focal lesion identified. Sinuses/Orbits: Mucosal thickening seen of the ethmoid air cells. The orbits and globes intact. Other: None IMPRESSION: No acute intracranial abnormality. Findings consistent with age related atrophy and chronic small vessel ischemia Electronically Signed   By: Prudencio Pair M.D.   On: 03/09/2020 14:05   DG Hip Unilat W or Wo Pelvis 2-3 Views Left  Result Date: 03/09/2020 CLINICAL DATA:  Left hip pain, recent hip arthroplasty EXAM: DG HIP (WITH OR WITHOUT PELVIS) 2-3V LEFT COMPARISON:  03/02/2020 FINDINGS: Interval postsurgical changes from left total hip arthroplasty. Arthroplasty components are in their expected alignment. No periprosthetic fracture identified. Pelvic bony ring intact. Partially visualized lower lumbar fusion hardware. Surgical staples overlie the lateral aspect of the left hip. IMPRESSION: Left  total hip arthroplasty without evidence of complication. Electronically Signed   By: Davina Poke D.O.   On: 03/09/2020 13:42    EKG: Independently reviewed.  Sinus rhythm     Assessment/Plan   1.  Acute metabolic encephalopathy -Specific etiology unknown, possibly related to medications, or developing infection -CT head on admission unrevealing -Continue home Prozac, oxcarbazepine, Wellbutrin -Patient was recently stopped on gabapentin and Librium, however does not appear to be showing signs of withdrawal -Behavioral health consulted -Holding home opioids and muscle relaxants -TSH ordered in the a.m. -Fall precautions  2.  History of bipolar disorder -Continue home psych meds as above -Psychiatry consulted -Further plans as above   3.  Hypothyroidism -TSH ordered -Continue home Synthroid for now   4.  Recent history of left femoral fracture, S/P total hip arthroplasty -Continue anticoagulation with home Lovenox   5.  Hypomagnesium -Stable on initial presentation, magnesium 1.9 -Continue home magnesium dilatation  6.  Hypokalemia -Initial presentation potassium 3.3 -Supplementation ordered -Follow-up a.m. labs  7.  COPD -No acute exacerbation this time -Continue home breathing treatments      DVT prophylaxis: Lovenox Code Status: Full Family Communication: none  Disposition Plan: Anticipate back to facility Consults called: Psych Admission status: Observation   At the point of initial evaluation, it is my clinical opinion that admission for OBSERVATION is reasonable and necessary because the patient's presenting complaints in the context of their chronic conditions represent sufficient risk of deterioration or significant morbidity to constitute reasonable grounds for close observation in the hospital  setting, but that the patient may be medically stable for discharge from the hospital within 24 to 48 hours.    Medical decision making: Patient seen  at 8:45 PM on 03/09/2020.  The patient was discussed with Dr. Francia Greaves.  What exists of the patient's chart was reviewed in depth and summarized above.  Clinical condition: Fair.        Doran Heater [facility] Please page though [place] or Epic secure chat:  For password, contact charge nurse

## 2020-03-09 NOTE — ED Provider Notes (Signed)
Patient seen after prior ED provider.  Patient remains moderately confused --apparently different from baseline.  Work-up thus far is without clear demonstration of the reason for her confusion.  Documented temperatures have recorded a temp of 100.2.  No clear source for this near fever.  Blood cultures ordered.  Urine and chest x-ray are without clear acute findings.  Left hip surgical incision is without drainage or erythema.  Covid test performed today was negative.  Patient likely would benefit from additional inpatient observation and additional work-up.  Hospitalist service is aware of case and will evaluate for admission.   Valarie Merino, MD 03/09/20 [phone]

## 2020-03-09 NOTE — ED Notes (Signed)
Pt off the floor

## 2020-03-10 DIAGNOSIS — G9340 Encephalopathy, unspecified: Secondary | ICD-10-CM | POA: Diagnosis not present

## 2020-03-10 LAB — COMPREHENSIVE METABOLIC PANEL
ALT: 15 U/L (ref 0–44)
AST: 16 U/L (ref 15–41)
Albumin: 3.1 g/dL — ABNORMAL LOW (ref 3.5–5.0)
Alkaline Phosphatase: 76 U/L (ref 38–126)
Anion gap: 11 (ref 5–15)
BUN: 12 mg/dL (ref 8–23)
CO2: 22 mmol/L (ref 22–32)
Calcium: 9.6 mg/dL (ref 8.9–10.3)
Chloride: 107 mmol/L (ref 98–111)
Creatinine, Ser: 0.78 mg/dL (ref 0.44–1.00)
GFR, Estimated: >60 mL/min (ref >60)
Glucose, Bld: 107 mg/dL — ABNORMAL HIGH (ref 70–99)
Potassium: 3.6 mmol/L (ref 3.5–5.1)
Sodium: 140 mmol/L (ref 135–145)
Total Bilirubin: 0.5 mg/dL (ref 0.3–1.2)
Total Protein: 5.6 g/dL — ABNORMAL LOW (ref 6.5–8.1)

## 2020-03-10 LAB — CBC
HCT: 33 % — ABNORMAL LOW (ref 36.0–46.0)
Hemoglobin: 10.3 g/dL — ABNORMAL LOW (ref 12.0–15.0)
MCH: 26.1 pg (ref 26.0–34.0)
MCHC: 31.2 g/dL (ref 30.0–36.0)
MCV: 83.8 fL (ref 80.0–100.0)
Platelets: 636 10*3/uL — ABNORMAL HIGH (ref 150–400)
RBC: 3.94 MIL/uL (ref 3.87–5.11)
RDW: 16.9 % — ABNORMAL HIGH (ref 11.5–15.5)
WBC: 7.2 10*3/uL (ref 4.0–10.5)
nRBC: 0 % (ref 0.0–0.2)

## 2020-03-10 LAB — MAGNESIUM: Magnesium: 2 mg/dL (ref 1.7–2.4)

## 2020-03-10 NOTE — Discharge Summary (Signed)
Physician Discharge Summary  [name] [id number] DOB: [birth year] DOA: 03/09/2020  PCP: [name], MD  Admit date: 03/09/2020 Discharge date: 03/10/2020  Admitted From: Rehab Discharge disposition: Rehab   Code Status: Full Code  Diet Recommendation: Cardiac diet  Discharge Diagnosis:   Principal Problem:   Encephalopathy Active Problems:   Hypothyroidism   Bipolar 1 disorder, depressed ([place])   Hypokalemia   Pressure injury of skin  Brief narrative: Victoria Levine is a 69 y.o. female with PMH significant for suicide attempt, EtOH abuse, Bipolar disorder, Herpes, anxiety DO, degenerative disc disease, GERD, asthma, hypothyroidism, osteoporosis, recent left hip fracture. Patient was sent to the ED from SNF on 12/22 for worsening mental status over the last few days.  She was recently hospitalized (12/7-12/16) for left femoral fracture.  She was confused at that time as well and was suspected to be due to polypharmacy.  On discharge meds were discontinued including gabapentin, Robaxin, promethazine, and Librium discontinued. At the rehab, patient continued to remain confused which started to worsen in last few days and hence patient was sent to the ED.  In the ED, vital signs stable.  Labs with potassium low at 3.3, hemoglobin low at 9.8. CT scan of head normal. Urine drug screen unremarkable.  Urinalysis normal. Patient was kept under observation in hospitalist service.  Subjective: Patient was seen and examined this afternoon in the ED. Chart reviewed.  Not in distress.  She was nauseated.  She was seen by behavioral health team earlier.  No need of inpatient stay.  Checked with Education officer, museum, patient can be discharged back today.  Assessment/Plan: Acute metabolic encephalopathy -Does not seem to have any new organic etiology.  No evidence of acute infection.  Normal urinalysis.  CT head normal.   -Mental status change likely related to her underlying  psychiatric conditions and medications.  -Continue home Prozac, oxcarbazepine, Wellbutrin -Patient was recently stopped on gabapentin and Librium, however does not appear to be showing signs of withdrawal -Behavioral health was consulted.  No need of inpatient monitoring. -Resume medicines that she was using prior to admission.    Multiple psychiatric disorders: Bipolar disorder, anxiety, alcohol abuse, history of suicide attempt -Continue home psych meds as above -Psychiatry consult appreciated.  Hypokalemia/hypomagnesemia -Replacement given.  Recent Labs  Lab 03/09/20 1026 03/09/20 1358 03/09/20 1655 03/09/20 2116 03/10/20 0650  K 3.2* 3.3*  --   --  3.6  MG  --   --  1.9  --  2.0  PHOS  --   --  3.2 3.0  --    Hypothyroidism -TSH normal -Continue home Synthroid for now Recent Labs    08/31/19 1240  TSH 2.086   Recent history of left femoral fracture, P total hip arthroplasty -Continue DVT prophylaxis with home Lovenox  COPD -No acute exacerbation this time -Continue home breathing treatments  Wound care: Incision (Closed) 02/24/20 Hip Left (Active)  Date First Assessed/Time First Assessed: 02/24/20 1520   Location: Hip  Location Orientation: Left    Assessments 02/24/2020  4:35 PM 03/02/2020 10:45 PM  Dressing Type Silver hydrofiber Silver hydrofiber  Dressing Clean;Dry;Intact Clean;Dry;Intact  Site / Wound Assessment Dressing in place / Unable to assess Dressing in place / Unable to assess  Drainage Amount None --     No Linked orders to display     Pressure Injury 03/09/20 Sacrum Posterior Stage 1 -  Intact skin with non-blanchable redness of a localized area usually over a bony prominence. dressing  from nursing home in place on the sacrum, but to the right stage 1 noted.  (Active)  Date First Assessed/Time First Assessed: 03/09/20 1600   Location: Sacrum  Location Orientation: Posterior  Staging: Stage 1 -  Intact skin with non-blanchable redness of a  localized area usually over a bony prominence.  Wound Description (Comments): ...    Assessments 03/09/2020  4:34 PM  Dressing Dry;Clean     No Linked orders to display    Discharge Exam:   Vitals:   03/10/20 0945 03/10/20 1000 03/10/20 1015 03/10/20 1215  BP:    137/81  Pulse: 89 (!) 102 88 88  Resp: (!) 23 (!) 24 19 19   Temp:      TempSrc:      SpO2: 97% (!) 89% 97% 99%  Weight:      Height:        Body mass index is 26.31 kg/m.  General exam: pleasant, not in distress. Skin: No rashes, lesions or ulcers. HEENT: Atraumatic, normocephalic, no obvious bleeding Lungs: Clear to auscultation bilaterally CVS: Regular rate and rhythm, no murmur GI/Abd soft, nontender, nondistended, bowel sound present CNS: Alert, awake, oriented x3. Psychiatry: She seems to have baseline psychiatric issues and flights of ideas but not agitated, restless. Extremities: No pedal edema, no calf tenderness  Follow ups:   Discharge Instructions    Diet - low sodium heart healthy   Complete by: As directed    Increase activity slowly   Complete by: As directed    No dressing needed   Complete by: As directed       Follow-up Information    [name], MD Follow up.   Specialty: Family Medicine Contact information: [address] [address] [phone]               Recommendations for Outpatient Follow-Up:   1. Follow-up with PCP as an outpatient.  Follow-up psychiatry as an outpatient.  Discharge Instructions:  Follow with Primary MD [name], MD in 7 days   Get CBC/BMP checked in next visit within 1 week by PCP or SNF MD ( we routinely change or add medications that can affect your baseline labs and fluid status, therefore we recommend that you get the mentioned basic workup next visit with your PCP, your PCP may decide not to get them or add new tests based on their clinical decision)  On your next visit with your PCP, please Get  Medicines reviewed and adjusted.  Please request your PCP  to go over all Hospital Tests and Procedure/Radiological results at the follow up, please get all Hospital records sent to your Prim MD by signing hospital release before you go home.  Activity: As tolerated with Full fall precautions use walker/cane & assistance as needed  For Heart failure patients - Check your Weight same time everyday, if you gain over 2 pounds, or you develop in leg swelling, experience more shortness of breath or chest pain, call your Primary MD immediately. Follow Cardiac Low Salt Diet and 1.5 lit/day fluid restriction.  If you have smoked or chewed Tobacco in the last 2 yrs please stop smoking, stop any regular Alcohol  and or any Recreational drug use.  If you experience worsening of your admission symptoms, develop shortness of breath, life threatening emergency, suicidal or homicidal thoughts you must seek medical attention immediately by calling 911 or calling your MD immediately  if symptoms less severe.  You Must read complete instructions/literature along  with all the possible adverse reactions/side effects for all the Medicines you take and that have been prescribed to you. Take any new Medicines after you have completely understood and accpet all the possible adverse reactions/side effects.   Do not drive, operate heavy machinery, perform activities at heights, swimming or participation in water activities or provide baby sitting services if your were admitted for syncope or siezures until you have seen by Primary MD or a Neurologist and advised to do so again.  Do not drive when taking Pain medications.  Do not take more than prescribed Pain, Sleep and Anxiety Medications  Wear Seat belts while driving.   Please note You were cared for by a hospitalist during your hospital stay. If you have any questions about your discharge medications or the care you received while you were in the hospital after you  are discharged, you can call the unit and asked to speak with the hospitalist on call if the hospitalist that took care of you is not available. Once you are discharged, your primary care physician will handle any further medical issues. Please note that NO REFILLS for any discharge medications will be authorized once you are discharged, as it is imperative that you return to your primary care physician (or establish a relationship with a primary care physician if you do not have one) for your aftercare needs so that they can reassess your need for medications and monitor your lab values.    Allergies as of 03/10/2020      Reactions   Prednisone Other (See Comments)   Cannot tolerate ORALLY, but can tolerate by shot (interacts with anxiety & bipolarity)   Lithium Carbonate Other (See Comments)   Patient was taken off of this because of a weight gain of 40 pounds   Sulfa Antibiotics Other (See Comments)   From childhood; reaction not recalled      Medication List    TAKE these medications   albuterol 108 (90 Base) MCG/ACT inhaler Commonly known as: VENTOLIN HFA Inhale 2 puffs into the lungs every 6 (six) hours as needed for wheezing or shortness of breath.   budesonide 0.25 MG/2ML nebulizer solution Commonly known as: PULMICORT Take 2 mLs (0.25 mg total) by nebulization 2 (two) times daily.   buPROPion 150 MG 24 hr tablet Commonly known as: WELLBUTRIN XL Take 300 mg by mouth daily.   calcium citrate-vitamin D 315-200 MG-UNIT tablet Commonly known as: Calcium + D Take 1 tablet by mouth daily. For bone health What changed: additional instructions   cyclobenzaprine 10 MG tablet Commonly known as: FLEXERIL Take 10 mg by mouth every 8 (eight) hours as needed for muscle spasms.   denosumab 60 MG/ML Soln injection Commonly known as: PROLIA Inject 60 mg into the skin every 6 (six) months. Administer in upper arm, thigh, or abdomen  NEXT IS DUE 03-2017   dicyclomine 20 MG  tablet Commonly known as: BENTYL Take 1 tablet (20 mg total) by mouth every 8 (eight) hours as needed for spasms.   docusate sodium 100 MG capsule Commonly known as: COLACE Take 1 capsule (100 mg total) by mouth 2 (two) times daily. What changed: when to take this   enoxaparin 40 MG/0.4ML injection Commonly known as: LOVENOX Inject 0.4 mLs (40 mg total) into the skin daily.   EPINEPHrine 0.3 mg/0.3 mL Soaj injection Commonly known as: EPI-PEN Inject 0.3 mg into the skin daily as needed for anaphylaxis.   famotidine 20 MG tablet Commonly known as: PEPCID  Take 1 tablet (20 mg total) by mouth at bedtime.   feeding supplement Liqd Take 237 mLs by mouth daily.   Ferrous Fumarate-Folic Acid 99991111 MG Tabs Take 1 tablet by mouth 2 (two) times daily after a meal.   FLUoxetine 20 MG capsule Commonly known as: PROZAC Take 20 mg by mouth 2 (two) times daily.   levothyroxine 112 MCG tablet Commonly known as: SYNTHROID Take 112 mcg by mouth daily before breakfast.   magnesium oxide 400 (241.3 Mg) MG tablet Commonly known as: MAG-OX Take 1 tablet (400 mg total) by mouth daily.   multivitamin tablet Take 1 tablet by mouth daily. For low vitamin   naproxen sodium 220 MG tablet Commonly known as: ALEVE Take 220 mg by mouth every 12 (twelve) hours as needed (pain).   ondansetron 4 MG disintegrating tablet Commonly known as: Zofran ODT Take 1 tablet (4 mg total) by mouth every 8 (eight) hours as needed for nausea or vomiting.   oxcarbazepine 600 MG tablet Commonly known as: TRILEPTAL Take 600 mg by mouth daily.   oxyCODONE ER 13.5 MG C12a Take 13.5 mg by mouth 2 (two) times daily.   Oxycodone HCl 10 MG Tabs Take 1 tablet (10 mg total) by mouth every 4 (four) hours as needed (for breakthrough pain).   pantoprazole 40 MG tablet Commonly known as: PROTONIX Take 1 tablet (40 mg total) by mouth 2 (two) times daily before a meal.   polyethylene glycol 17 g packet Commonly known  as: MIRALAX / GLYCOLAX Take 17 g by mouth daily as needed for moderate constipation or severe constipation.   QUEtiapine 100 MG tablet Commonly known as: SEROQUEL Take 1 tablet (100 mg total) by mouth at bedtime as needed (sleep).   rizatriptan 10 MG disintegrating tablet Commonly known as: MAXALT-MLT Take 10 mg by mouth daily as needed for migraine.   sucralfate 1 g tablet Commonly known as: Carafate Take 1 tablet (1 g total) by mouth 4 (four) times daily -  with meals and at bedtime.   thiamine 100 MG tablet Take 1 tablet (100 mg total) by mouth daily.   valACYclovir 500 MG tablet Commonly known as: VALTREX Take one tablet twice daily 3-5 days then daily as needed. What changed:   how to take this  when to take this  additional instructions            Discharge Care Instructions  (From admission, onward)         Start     Ordered   03/10/20 0000  No dressing needed        03/10/20 1437          Time coordinating discharge: 35 minutes  The results of significant diagnostics from this hospitalization (including imaging, microbiology, ancillary and laboratory) are listed below for reference.    Procedures and Diagnostic Studies:   DG Chest 1 View  Result Date: 03/09/2020 CLINICAL DATA:  Altered mental status. EXAM: CHEST  1 VIEW COMPARISON:  February 23, 2020 chest radiograph; CT abdomen with conclusion in lung bases November 17, 2019 FINDINGS: There is atelectatic change in the left mid lung. There is no evident edema or airspace opacity. There is stable cardiac prominence. Opacity adjacent to the right heart border is stable and is demonstrated by CT to demonstrate herniated peritoneal fat abutting the right heart border. No adenopathy. No bone lesions. IMPRESSION: Atelectatic change left mid lung. Lungs elsewhere clear. Stable cardiac prominence. Soft tissue prominence adjacent to the right heart  border consistent with fatty tissue herniated from the peritoneum,  demonstrated on prior CT and stable in appearance. Electronically Signed   By: Lowella Grip III M.D.   On: 03/09/2020 13:38   CT Head Wo Contrast  Result Date: 03/09/2020 CLINICAL DATA:  Head trauma EXAM: CT HEAD WITHOUT CONTRAST TECHNIQUE: Contiguous axial images were obtained from the base of the skull through the vertex without intravenous contrast. COMPARISON:  March 02, 2020 FINDINGS: Brain: No evidence of acute territorial infarction, hemorrhage, hydrocephalus,extra-axial collection or mass lesion/mass effect. There is dilatation the ventricles and sulci consistent with age-related atrophy. Low-attenuation changes in the deep white matter consistent with small vessel ischemia. Tiny calcific density overlying the posterior falx, likely meningioma. Vascular: No hyperdense vessel or unexpected calcification. Skull: The skull is intact. No fracture or focal lesion identified. Sinuses/Orbits: Mucosal thickening seen of the ethmoid air cells. The orbits and globes intact. Other: None IMPRESSION: No acute intracranial abnormality. Findings consistent with age related atrophy and chronic small vessel ischemia Electronically Signed   By: Prudencio Pair M.D.   On: 03/09/2020 14:05   DG Hip Unilat W or Wo Pelvis 2-3 Views Left  Result Date: 03/09/2020 CLINICAL DATA:  Left hip pain, recent hip arthroplasty EXAM: DG HIP (WITH OR WITHOUT PELVIS) 2-3V LEFT COMPARISON:  03/02/2020 FINDINGS: Interval postsurgical changes from left total hip arthroplasty. Arthroplasty components are in their expected alignment. No periprosthetic fracture identified. Pelvic bony ring intact. Partially visualized lower lumbar fusion hardware. Surgical staples overlie the lateral aspect of the left hip. IMPRESSION: Left total hip arthroplasty without evidence of complication. Electronically Signed   By: Davina Poke D.O.   On: 03/09/2020 13:42     Labs:   Basic Metabolic Panel: Recent Labs  Lab 03/09/20 1026  03/09/20 1358 03/09/20 1655 03/09/20 2116 03/10/20 0650  NA 138 140  --   --  140  K 3.2* 3.3*  --   --  3.6  CL 106 107  --   --  107  CO2 22 22  --   --  22  GLUCOSE 132* 104*  --   --  107*  BUN 13 12  --   --  12  CREATININE 0.83 0.78  --   --  0.78  CALCIUM 9.4 9.5  --   --  9.6  MG  --   --  1.9  --  2.0  PHOS  --   --  3.2 3.0  --    GFR Estimated Creatinine Clearance: 68.6 mL/min (by C-G formula based on SCr of 0.78 mg/dL). Liver Function Tests: Recent Labs  Lab 03/09/20 1026 03/09/20 1358 03/10/20 0650  AST 13* 13* 16  ALT 15 15 15   ALKPHOS 77 76 76  BILITOT 0.2* 0.7 0.5  PROT 6.0* 6.0* 5.6*  ALBUMIN 3.3* 3.2* 3.1*   No results for input(s): LIPASE, AMYLASE in the last 168 hours. No results for input(s): AMMONIA in the last 168 hours. Coagulation profile No results for input(s): INR, PROTIME in the last 168 hours.  CBC: Recent Labs  Lab 03/09/20 1026 03/09/20 1358 03/10/20 0501  WBC 9.8 8.7 7.2  NEUTROABS  --  6.5  --   HGB 9.6* 9.8* 10.3*  HCT 32.8* 32.1* 33.0*  MCV 85.0 83.4 83.8  PLT 655* 642* 636*   Cardiac Enzymes: No results for input(s): CKTOTAL, CKMB, CKMBINDEX, TROPONINI in the last 168 hours. BNP: Invalid input(s): POCBNP CBG: Recent Labs  Lab 03/09/20 1032  GLUCAP 125*  D-Dimer No results for input(s): DDIMER in the last 72 hours. Hgb A1c No results for input(s): HGBA1C in the last 72 hours. Lipid Profile No results for input(s): CHOL, HDL, LDLCALC, TRIG, CHOLHDL, LDLDIRECT in the last 72 hours. Thyroid function studies No results for input(s): TSH, T4TOTAL, T3FREE, THYROIDAB in the last 72 hours.  Invalid input(s): FREET3 Anemia work up No results for input(s): VITAMINB12, FOLATE, FERRITIN, TIBC, IRON, RETICCTPCT in the last 72 hours. Microbiology Recent Results (from the past 240 hour(s))  Resp Panel by RT-PCR (Flu A&B, Covid) Nasopharyngeal Swab     Status: None   Collection Time: 03/03/20 12:05 AM   Specimen:  Nasopharyngeal Swab; Nasopharyngeal(NP) swabs in vial transport medium  Result Value Ref Range Status   SARS Coronavirus 2 by RT PCR NEGATIVE NEGATIVE Final    Comment: (NOTE) SARS-CoV-2 target nucleic acids are NOT DETECTED.  The SARS-CoV-2 RNA is generally detectable in upper respiratory specimens during the acute phase of infection. The lowest concentration of SARS-CoV-2 viral copies this assay can detect is 138 copies/mL. A negative result does not preclude SARS-Cov-2 infection and should not be used as the sole basis for treatment or other patient management decisions. A negative result may occur with  improper specimen collection/handling, submission of specimen other than nasopharyngeal swab, presence of viral mutation(s) within the areas targeted by this assay, and inadequate number of viral copies(<138 copies/mL). A negative result must be combined with clinical observations, patient history, and epidemiological information. The expected result is Negative.  Fact Sheet for Patients:  EntrepreneurPulse.com.au  Fact Sheet for Healthcare Providers:  IncredibleEmployment.be  This test is no t yet approved or cleared by the [place] FDA and  has been authorized for detection and/or diagnosis of SARS-CoV-2 by FDA under an Emergency Use Authorization (EUA). This EUA will remain  in effect (meaning this test can be used) for the duration of the COVID-19 declaration under Section 564(b)(1) of the Act, 21 U.S.C.section 360bbb-3(b)(1), unless the authorization is terminated  or revoked sooner.       Influenza A by PCR NEGATIVE NEGATIVE Final   Influenza B by PCR NEGATIVE NEGATIVE Final    Comment: (NOTE) The Xpert Xpress SARS-CoV-2/FLU/RSV plus assay is intended as an aid in the diagnosis of influenza from Nasopharyngeal swab specimens and should not be used as a sole basis for treatment. Nasal washings and aspirates are unacceptable for  Xpert Xpress SARS-CoV-2/FLU/RSV testing.  Fact Sheet for Patients: EntrepreneurPulse.com.au  Fact Sheet for Healthcare Providers: IncredibleEmployment.be  This test is not yet approved or cleared by the [place] FDA and has been authorized for detection and/or diagnosis of SARS-CoV-2 by FDA under an Emergency Use Authorization (EUA). This EUA will remain in effect (meaning this test can be used) for the duration of the COVID-19 declaration under Section 564(b)(1) of the Act, 21 U.S.C. section 360bbb-3(b)(1), unless the authorization is terminated or revoked.  Performed at [facility], [place] [address] [place], [place] [address]   Resp Panel by RT-PCR (Flu A&B, Covid) Nasopharyngeal Swab     Status: None   Collection Time: 03/09/20  2:27 PM   Specimen: Nasopharyngeal Swab; Nasopharyngeal(NP) swabs in vial transport medium  Result Value Ref Range Status   SARS Coronavirus 2 by RT PCR NEGATIVE NEGATIVE Final    Comment: (NOTE) SARS-CoV-2 target nucleic acids are NOT DETECTED.  The SARS-CoV-2 RNA is generally detectable in upper respiratory specimens during the acute phase of infection. The lowest concentration of SARS-CoV-2 viral copies  this assay can detect is 138 copies/mL. A negative result does not preclude SARS-Cov-2 infection and should not be used as the sole basis for treatment or other patient management decisions. A negative result may occur with  improper specimen collection/handling, submission of specimen other than nasopharyngeal swab, presence of viral mutation(s) within the areas targeted by this assay, and inadequate number of viral copies(<138 copies/mL). A negative result must be combined with clinical observations, patient history, and epidemiological information. The expected result is Negative.  Fact Sheet for Patients:  EntrepreneurPulse.com.au  Fact Sheet for Healthcare  Providers:  IncredibleEmployment.be  This test is no t yet approved or cleared by the [place] FDA and  has been authorized for detection and/or diagnosis of SARS-CoV-2 by FDA under an Emergency Use Authorization (EUA). This EUA will remain  in effect (meaning this test can be used) for the duration of the COVID-19 declaration under Section 564(b)(1) of the Act, 21 U.S.C.section 360bbb-3(b)(1), unless the authorization is terminated  or revoked sooner.       Influenza A by PCR NEGATIVE NEGATIVE Final   Influenza B by PCR NEGATIVE NEGATIVE Final    Comment: (NOTE) The Xpert Xpress SARS-CoV-2/FLU/RSV plus assay is intended as an aid in the diagnosis of influenza from Nasopharyngeal swab specimens and should not be used as a sole basis for treatment. Nasal washings and aspirates are unacceptable for Xpert Xpress SARS-CoV-2/FLU/RSV testing.  Fact Sheet for Patients: EntrepreneurPulse.com.au  Fact Sheet for Healthcare Providers: IncredibleEmployment.be  This test is not yet approved or cleared by the [place] FDA and has been authorized for detection and/or diagnosis of SARS-CoV-2 by FDA under an Emergency Use Authorization (EUA). This EUA will remain in effect (meaning this test can be used) for the duration of the COVID-19 declaration under Section 564(b)(1) of the Act, 21 U.S.C. section 360bbb-3(b)(1), unless the authorization is terminated or revoked.  Performed at [facility], [place] [address] [place], [place] [address]   Culture, blood (routine x 2)     Status: None (Preliminary result)   Collection Time: 03/09/20  6:41 PM   Specimen: BLOOD  Result Value Ref Range Status   Specimen Description BLOOD SITE NOT SPECIFIED  Final   Special Requests   Final    BOTTLES DRAWN AEROBIC AND ANAEROBIC Blood Culture results may not be optimal due to an inadequate volume of blood received in culture bottles    Culture   Final    NO GROWTH < 24 HOURS Performed at [facility], [place] [address] [place], [place] [address]    Report Status PENDING  Incomplete  Culture, blood (routine x 2)     Status: None (Preliminary result)   Collection Time: 03/09/20  6:42 PM   Specimen: BLOOD  Result Value Ref Range Status   Specimen Description BLOOD SITE NOT SPECIFIED  Final   Special Requests   Final    BOTTLES DRAWN AEROBIC AND ANAEROBIC Blood Culture adequate volume   Culture   Final    NO GROWTH < 24 HOURS Performed at [facility], [place] [address] [place], [place] [address]    Report Status PENDING  Incomplete     Signed: Marlowe Aschoff Myriah Boggus  [facility] 03/10/2020, 2:37 PM

## 2020-03-10 NOTE — ED Notes (Signed)
Lunch Tray Ordered @ 1046. 

## 2020-03-10 NOTE — Progress Notes (Addendum)
CSW spoke with Dr. Pietro Cassis - patient is ready for discharge back to [facility].  CSW spoke with Claiborne Billings at [facility] who states the patient can return to the facility.   The patient will go to room 128. The patient will be transported via [place], [place] called for pickup for first available. The number to call report is [phone].   CSW initiated insurance authorization from [facility]. CSW faxed patient's discharge summary to [facility]  Madilyn Fireman, MSW, LCSW-A Transitions of Care  Clinical Social Worker I [facility] Emergency Departments  Medical ICU [phone]

## 2020-03-10 NOTE — ED Notes (Signed)
MD notified of continued vomiting, advised cancel discharge.

## 2020-03-10 NOTE — ED Notes (Signed)
Cancelled PTAR for patient

## 2020-03-10 NOTE — ED Notes (Signed)
Rounded on this pt -pt appears anxious, not making any sense.

## 2020-03-11 ENCOUNTER — Other Ambulatory Visit: Payer: Self-pay

## 2020-03-11 ENCOUNTER — Encounter (HOSPITAL_COMMUNITY): Payer: Self-pay | Admitting: Internal Medicine

## 2020-03-11 DIAGNOSIS — G9340 Encephalopathy, unspecified: Secondary | ICD-10-CM | POA: Diagnosis not present

## 2020-03-11 MED ORDER — PANTOPRAZOLE SODIUM 40 MG PO TBEC
40.0000 mg | DELAYED_RELEASE_TABLET | Freq: Every day | ORAL | Status: DC
  Administered 2020-03-11: 40 mg via ORAL
  Filled 2020-03-11: qty 1

## 2020-03-11 MED ORDER — SUCRALFATE 1 G PO TABS
1.0000 g | ORAL_TABLET | Freq: Three times a day (TID) | ORAL | Status: DC
  Administered 2020-03-11: 1 g via ORAL
  Filled 2020-03-11 (×3): qty 1

## 2020-03-11 MED ORDER — INFLUENZA VAC A&B SA ADJ QUAD 0.5 ML IM PRSY: 0.5000 mL | PREFILLED_SYRINGE | INTRAMUSCULAR | Status: DC

## 2020-03-11 NOTE — Progress Notes (Signed)
Pt arrived to unit via stretcher.  Transferred to bed, VS stable, pt denies any pain.    Pt is alert and Oriented x 3, not oriented to situation.  Pt advised to call prior to getting up - she verbalized understanding.  Pt comfortable in bed, call bell and phone next to her.

## 2020-03-11 NOTE — ED Notes (Signed)
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 Her last drink was on 12/7 the night she was admitted for fall and subsequent hip surgery. Requesting information and support resources for post discharge help.

## 2020-03-11 NOTE — Plan of Care (Signed)

## 2020-03-11 NOTE — Progress Notes (Signed)
Pt requesting pain medication and anxiety medicine.    PRN meds for above not ordered.

## 2020-03-11 NOTE — Discharge Summary (Signed)
Physician Discharge Summary  RYE DORADO LKG:[id number] DOB: 22-Feb-1951 DOA: 03/09/2020  PCP: [name], MD  Admit date: 03/09/2020 Discharge date: 03/11/2020  Ad[facility] From: [facility] Discharge disposition: Back to SNF   Code Status: Full Code  Diet Recommendation: Cardiac diet  Discharge Diagnosis:   Principal Problem:   Encephalopathy Active Problems:   Hypothyroidism   Bipolar 1 disorder, depressed ([place])   Hypokalemia   Pressure injury of skin  Brief narrative: Victoria Levine is a 69 y.o. female with PMH significant for suicide attempt, EtOH abuse, Bipolar disorder, Herpes, anxiety DO, degenerative disc disease, GERD, asthma, hypothyroidism, osteoporosis, recent left hip fracture. Patient was sent to the ED from SNF on 12/22 for worsening mental status over the last few days.  She was recently hospitalized (12/7-12/16) for left femoral fracture.  She was confused at that time as well and was suspected to be due to polypharmacy.  On discharge meds were discontinued including gabapentin, Robaxin, promethazine, and Librium discontinued. At the rehab, patient continued to remain confused which started to worsen in last few days and hence patient was sent to the ED.  In the ED, vital signs stable.  Labs with potassium low at 3.3, hemoglobin low at 9.8. CT scan of head normal. Urine drug screen unremarkable.  Urinalysis normal. Patient was kept under observation in hospitalist service.  Subjective: Patient was seen and examined this morning. Lying down in bed.  Not in distress.  Patient was actually planned for discharge yesterday afternoon but she started having nausea and vomiting and hence discharge was held. Patient states that she was having reflux symptoms because of not getting PPI.  Once PPI was restarted, patient does not have any reflux/vomiting and feels ready to be discharged today.  Assessment/Plan: Acute metabolic  encephalopathy -Does not seem to have any new organic etiology.  No evidence of acute infection.  Normal urinalysis.  CT head normal.   -Mental status change likely related to her underlying psychiatric conditions and medications.  -Continue home Prozac, oxcarbazepine, Wellbutrin -Patient was recently stopped on gabapentin and Librium, however does not appear to be showing signs of withdrawal -Behavioral health was consulted.  No need of inpatient monitoring. -Resume medicines that she was using prior to admission.    Multiple psychiatric disorders: Bipolar disorder, anxiety, alcohol abuse, history of suicide attempt -Continue home psych meds as above -Psychiatry consult appreciated.  Hypokalemia/hypomagnesemia -Replacement given.  Recent Labs  Lab 03/09/20 1026 03/09/20 1358 03/09/20 1655 03/09/20 2116 03/10/20 0650  K 3.2* 3.3*  --   --  3.6  MG  --   --  1.9  --  2.0  PHOS  --   --  3.2 3.0  --    Hypothyroidism -TSH normal -Continue home Synthroid for now Recent Labs    08/31/19 1240  TSH 2.086   Recent history of left femoral fracture, P total hip arthroplasty -Continue DVT prophylaxis with home Lovenox  COPD -No acute exacerbation this time -Continue home breathing treatments  GERD -Continue PPI.  Wound care: Incision (Closed) 02/24/20 Hip Left (Active)  Date First Assessed/Time First Assessed: 02/24/20 1520   Location: Hip  Location Orientation: Left    Assessments 02/24/2020  4:35 PM 03/11/2020 11:00 AM  Dressing Type Silver hydrofiber Hydrocolloid  Dressing Clean;Dry;Intact Clean;Dry;Intact  Site / Wound Assessment Dressing in place / Unable to assess Dressing in place / Unable to assess  Drainage Amount None None     No Linked orders to display  Pressure Injury 03/09/20 Sacrum Posterior Stage 1 -  Intact skin with non-blanchable redness of a localized area usually over a bony prominence. dressing from nursing home in place on the sacrum, but to  the right stage 1 noted.  (Active)  Date First Assessed/Time First Assessed: 03/09/20 1600   Location: Sacrum  Location Orientation: Posterior  Staging: Stage 1 -  Intact skin with non-blanchable redness of a localized area usually over a bony prominence.  Wound Description (Comments): ...    Assessments 03/09/2020  4:34 PM 03/11/2020 11:00 AM  Dressing Dry;Clean --  Site / Wound Assessment -- Red  Drainage Amount -- None     No Linked orders to display    Discharge Exam:   Vitals:   03/11/20 0045 03/11/20 0130 03/11/20 0155 03/11/20 0822  BP: (!) 150/98 (!) 124/104 (!) 146/99   Pulse: 90 80 74   Resp: 15 18 16    Temp:  98.7 F (37.1 C) 98.5 F (36.9 C)   TempSrc:  Axillary Oral   SpO2: 92% 93% 96% 96%  Weight:   69.8 kg   Height:   5\' 7"  (1.702 m)     Body mass index is 24.1 kg/m.  General exam: pleasant, not in distress. Skin: No rashes, lesions or ulcers. HEENT: Atraumatic, normocephalic, no obvious bleeding Lungs: Clear to auscultation bilaterally CVS: Regular rate and rhythm, no murmur GI/Abd soft, nontender, nondistended, bowel sound present CNS: Alert, awake, oriented x3. Psychiatry: She seems to have baseline psychiatric issues and flights of ideas but not agitated, restless. Extremities: No pedal edema, no calf tenderness  Follow ups:   Discharge Instructions    Diet - low sodium heart healthy   Complete by: As directed    Increase activity slowly   Complete by: As directed    No dressing needed   Complete by: As directed       Follow-up Information    [name], MD Follow up.   Specialty: Family Medicine Contact information: [address] [place] [address] [phone]               Recommendations for Outpatient Follow-Up:   1. Follow-up with PCP as an outpatient.  Follow-up psychiatry as an outpatient.  Discharge Instructions:  Follow with Primary MD [name], MD in 7 days   Get CBC/BMP checked in  next visit within 1 week by PCP or SNF MD ( we routinely change or add medications that can affect your baseline labs and fluid status, therefore we recommend that you get the mentioned basic workup next visit with your PCP, your PCP may decide not to get them or add new tests based on their clinical decision)  On your next visit with your PCP, please Get Medicines reviewed and adjusted.  Please request your PCP  to go over all Hospital Tests and Procedure/Radiological results at the follow up, please get all Hospital records sent to your Prim MD by signing hospital release before you go home.  Activity: As tolerated with Full fall precautions use walker/cane & assistance as needed  For Heart failure patients - Check your Weight same time everyday, if you gain over 2 pounds, or you develop in leg swelling, experience more shortness of breath or chest pain, call your Primary MD immediately. Follow Cardiac Low Salt Diet and 1.5 lit/day fluid restriction.  If you have smoked or chewed Tobacco in the last 2 yrs please stop smoking, stop any regular Alcohol  and or any  Recreational drug use.  If you experience worsening of your admission symptoms, develop shortness of breath, life threatening emergency, suicidal or homicidal thoughts you must seek medical attention immediately by calling 911 or calling your MD immediately  if symptoms less severe.  You Must read complete instructions/literature along with all the possible adverse reactions/side effects for all the Medicines you take and that have been prescribed to you. Take any new Medicines after you have completely understood and accpet all the possible adverse reactions/side effects.   Do not drive, operate heavy machinery, perform activities at heights, swimming or participation in water activities or provide baby sitting services if your were admitted for syncope or siezures until you have seen by Primary MD or a Neurologist and advised to do so  again.  Do not drive when taking Pain medications.  Do not take more than prescribed Pain, Sleep and Anxiety Medications  Wear Seat belts while driving.   Please note You were cared for by a hospitalist during your hospital stay. If you have any questions about your discharge medications or the care you received while you were in the hospital after you are discharged, you can call the unit and asked to speak with the hospitalist on call if the hospitalist that took care of you is not available. Once you are discharged, your primary care physician will handle any further medical issues. Please note that NO REFILLS for any discharge medications will be authorized once you are discharged, as it is imperative that you return to your primary care physician (or establish a relationship with a primary care physician if you do not have one) for your aftercare needs so that they can reassess your need for medications and monitor your lab values.    Allergies as of 03/11/2020      Reactions   Prednisone Other (See Comments)   Cannot tolerate ORALLY, but can tolerate by shot (interacts with anxiety & bipolarity)   Lithium Carbonate Other (See Comments)   Patient was taken off of this because of a weight gain of 40 pounds   Sulfa Antibiotics Other (See Comments)   From childhood; reaction not recalled      Medication List    TAKE these medications   albuterol 108 (90 Base) MCG/ACT inhaler Commonly known as: VENTOLIN HFA Inhale 2 puffs into the lungs every 6 (six) hours as needed for wheezing or shortness of breath.   budesonide 0.25 MG/2ML nebulizer solution Commonly known as: PULMICORT Take 2 mLs (0.25 mg total) by nebulization 2 (two) times daily.   buPROPion 150 MG 24 hr tablet Commonly known as: WELLBUTRIN XL Take 300 mg by mouth daily.   calcium citrate-vitamin D 315-200 MG-UNIT tablet Commonly known as: Calcium + D Take 1 tablet by mouth daily. For bone health What changed:  additional instructions   cyclobenzaprine 10 MG tablet Commonly known as: FLEXERIL Take 10 mg by mouth every 8 (eight) hours as needed for muscle spasms.   denosumab 60 MG/ML Soln injection Commonly known as: PROLIA Inject 60 mg into the skin every 6 (six) months. Administer in upper arm, thigh, or abdomen  NEXT IS DUE 03-2017   dicyclomine 20 MG tablet Commonly known as: BENTYL Take 1 tablet (20 mg total) by mouth every 8 (eight) hours as needed for spasms.   docusate sodium 100 MG capsule Commonly known as: COLACE Take 1 capsule (100 mg total) by mouth 2 (two) times daily. What changed: when to take this   enoxaparin 40  MG/0.4ML injection Commonly known as: LOVENOX Inject 0.4 mLs (40 mg total) into the skin daily.   EPINEPHrine 0.3 mg/0.3 mL Soaj injection Commonly known as: EPI-PEN Inject 0.3 mg into the skin daily as needed for anaphylaxis.   famotidine 20 MG tablet Commonly known as: PEPCID Take 1 tablet (20 mg total) by mouth at bedtime.   feeding supplement Liqd Take 237 mLs by mouth daily.   Ferrous Fumarate-Folic Acid 324-1 MG Tabs Take 1 tablet by mouth 2 (two) times daily after a meal.   FLUoxetine 20 MG capsule Commonly known as: PROZAC Take 20 mg by mouth 2 (two) times daily.   levothyroxine 112 MCG tablet Commonly known as: SYNTHROID Take 112 mcg by mouth daily before breakfast.   magnesium oxide 400 (241.3 Mg) MG tablet Commonly known as: MAG-OX Take 1 tablet (400 mg total) by mouth daily.   multivitamin tablet Take 1 tablet by mouth daily. For low vitamin   naproxen sodium 220 MG tablet Commonly known as: ALEVE Take 220 mg by mouth every 12 (twelve) hours as needed (pain).   ondansetron 4 MG disintegrating tablet Commonly known as: Zofran ODT Take 1 tablet (4 mg total) by mouth every 8 (eight) hours as needed for nausea or vomiting.   oxcarbazepine 600 MG tablet Commonly known as: TRILEPTAL Take 600 mg by mouth daily.   oxyCODONE ER 13.5  MG C12a Take 13.5 mg by mouth 2 (two) times daily.   Oxycodone HCl 10 MG Tabs Take 1 tablet (10 mg total) by mouth every 4 (four) hours as needed (for breakthrough pain).   pantoprazole 40 MG tablet Commonly known as: PROTONIX Take 1 tablet (40 mg total) by mouth 2 (two) times daily before a meal.   polyethylene glycol 17 g packet Commonly known as: MIRALAX / GLYCOLAX Take 17 g by mouth daily as needed for moderate constipation or severe constipation.   QUEtiapine 100 MG tablet Commonly known as: SEROQUEL Take 1 tablet (100 mg total) by mouth at bedtime as needed (sleep).   rizatriptan 10 MG disintegrating tablet Commonly known as: MAXALT-MLT Take 10 mg by mouth daily as needed for migraine.   sucralfate 1 g tablet Commonly known as: Carafate Take 1 tablet (1 g total) by mouth 4 (four) times daily -  with meals and at bedtime.   thiamine 100 MG tablet Take 1 tablet (100 mg total) by mouth daily.   valACYclovir 500 MG tablet Commonly known as: VALTREX Take one tablet twice daily 3-5 days then daily as needed. What changed:   how to take this  when to take this  additional instructions            Discharge Care Instructions  (From admission, onward)         Start     Ordered   03/10/20 0000  No dressing needed        03/10/20 1437          Time coordinating discharge: 35 minutes  The results of significant diagnostics from this hospitalization (including imaging, microbiology, ancillary and laboratory) are listed below for reference.    Procedures and Diagnostic Studies:   DG Chest 1 View  Result Date: 03/09/2020 CLINICAL DATA:  Altered mental status. EXAM: CHEST  1 VIEW COMPARISON:  February 23, 2020 chest radiograph; CT abdomen with conclusion in lung bases November 17, 2019 FINDINGS: There is atelectatic change in the left mid lung. There is no evident edema or airspace opacity. There is stable cardiac prominence.  Opacity adjacent to the right heart  border is stable and is demonstrated by CT to demonstrate herniated peritoneal fat abutting the right heart border. No adenopathy. No bone lesions. IMPRESSION: Atelectatic change left mid lung. Lungs elsewhere clear. Stable cardiac prominence. Soft tissue prominence adjacent to the right heart border consistent with fatty tissue herniated from the peritoneum, demonstrated on prior CT and stable in appearance. Electronically Signed   By: Bretta Bang III M.D.   On: 03/09/2020 13:38   CT Head Wo Contrast  Result Date: 03/09/2020 CLINICAL DATA:  Head trauma EXAM: CT HEAD WITHOUT CONTRAST TECHNIQUE: Contiguous axial images were obtained from the base of the skull through the vertex without intravenous contrast. COMPARISON:  March 02, 2020 FINDINGS: Brain: No evidence of acute territorial infarction, hemorrhage, hydrocephalus,extra-axial collection or mass lesion/mass effect. There is dilatation the ventricles and sulci consistent with age-related atrophy. Low-attenuation changes in the deep white matter consistent with small vessel ischemia. Tiny calcific density overlying the posterior falx, likely meningioma. Vascular: No hyperdense vessel or unexpected calcification. Skull: The skull is intact. No fracture or focal lesion identified. Sinuses/Orbits: Mucosal thickening seen of the ethmoid air cells. The orbits and globes intact. Other: None IMPRESSION: No acute intracranial abnormality. Findings consistent with age related atrophy and chronic small vessel ischemia Electronically Signed   By: Jonna Clark M.D.   On: 03/09/2020 14:05   DG Hip Unilat W or Wo Pelvis 2-3 Views Left  Result Date: 03/09/2020 CLINICAL DATA:  Left hip pain, recent hip arthroplasty EXAM: DG HIP (WITH OR WITHOUT PELVIS) 2-3V LEFT COMPARISON:  03/02/2020 FINDINGS: Interval postsurgical changes from left total hip arthroplasty. Arthroplasty components are in their expected alignment. No periprosthetic fracture identified. Pelvic  bony ring intact. Partially visualized lower lumbar fusion hardware. Surgical staples overlie the lateral aspect of the left hip. IMPRESSION: Left total hip arthroplasty without evidence of complication. Electronically Signed   By: Duanne Guess D.O.   On: 03/09/2020 13:42     Labs:   Basic Metabolic Panel: Recent Labs  Lab 03/09/20 1026 03/09/20 1358 03/09/20 1655 03/09/20 2116 03/10/20 0650  NA 138 140  --   --  140  K 3.2* 3.3*  --   --  3.6  CL 106 107  --   --  107  CO2 22 22  --   --  22  GLUCOSE 132* 104*  --   --  107*  BUN 13 12  --   --  12  CREATININE 0.83 0.78  --   --  0.78  CALCIUM 9.4 9.5  --   --  9.6  MG  --   --  1.9  --  2.0  PHOS  --   --  3.2 3.0  --    GFR Estimated Creatinine Clearance: 62.7 mL/min (by C-G formula based on SCr of 0.78 mg/dL). Liver Function Tests: Recent Labs  Lab 03/09/20 1026 03/09/20 1358 03/10/20 0650  AST 13* 13* 16  ALT ALKPHOS 77 76 76  BILITOT 0.2* 0.7 0.5  PROT 6.0* 6.0* 5.6*  ALBUMIN 3.3* 3.2* 3.1*   No results for input(s): LIPASE, AMYLASE in the last 168 hours. No results for input(s): AMMONIA in the last 168 hours. Coagulation profile No results for input(s): INR, PROTIME in the last 168 hours.  CBC: Recent Labs  Lab 03/09/20 1026 03/09/20 1358 03/10/20 0501  WBC 9.8 8.7 7.2  NEUTROABS  --  6.5  --   HGB 9.6*  9.8* 10.3*  HCT 32.8* 32.1* 33.0*  MCV 85.0 83.4 83.8  PLT 655* 642* 636*   Cardiac Enzymes: No results for input(s): CKTOTAL, CKMB, CKMBINDEX, TROPONINI in the last 168 hours. BNP: Invalid input(s): POCBNP CBG: Recent Labs  Lab 03/09/20 1032  GLUCAP 125*   D-Dimer No results for input(s): DDIMER in the last 72 hours. Hgb A1c No results for input(s): HGBA1C in the last 72 hours. Lipid Profile No results for input(s): CHOL, HDL, LDLCALC, TRIG, CHOLHDL, LDLDIRECT in the last 72 hours. Thyroid function studies No results for input(s): TSH, T4TOTAL, T3FREE, THYROIDAB in the  last 72 hours.  Invalid input(s): FREET3 Anemia work up No results for input(s): VITAMINB12, FOLATE, FERRITIN, TIBC, IRON, RETICCTPCT in the last 72 hours. Microbiology Recent Results (from the past 240 hour(s))  Resp Panel by RT-PCR (Flu A&B, Covid) Nasopharyngeal Swab     Status: None   Collection Time: 03/03/20 12:05 AM   Specimen: Nasopharyngeal Swab; Nasopharyngeal(NP) swabs in vial transport medium  Result Value Ref Range Status   SARS Coronavirus 2 by RT PCR NEGATIVE NEGATIVE Final    Comment: (NOTE) SARS-CoV-2 target nucleic acids are NOT DETECTED.  The SARS-CoV-2 RNA is generally detectable in upper respiratory specimens during the acute phase of infection. The lowest concentration of SARS-CoV-2 viral copies this assay can detect is 138 copies/mL. A negative result does not preclude SARS-Cov-2 infection and should not be used as the sole basis for treatment or other patient management decisions. A negative result may occur with  improper specimen collection/handling, submission of specimen other than nasopharyngeal swab, presence of viral mutation(s) within the areas targeted by this assay, and inadequate number of viral copies(<138 copies/mL). A negative result must be combined with clinical observations, patient history, and epidemiological information. The expected result is Negative.  Fact Sheet for Patients:  BloggerCourse.com  Fact Sheet for Healthcare Providers:  SeriousBroker.it  This test is no t yet approved or cleared by the [place] FDA and  has been authorized for detection and/or diagnosis of SARS-CoV-2 by FDA under an Emergency Use Authorization (EUA). This EUA will remain  in effect (meaning this test can be used) for the duration of the COVID-19 declaration under Section 564(b)(1) of the Act, 21 U.S.C.section 360bbb-3(b)(1), unless the authorization is terminated  or revoked sooner.        Influenza A by PCR NEGATIVE NEGATIVE Final   Influenza B by PCR NEGATIVE NEGATIVE Final    Comment: (NOTE) The Xpert Xpress SARS-CoV-2/FLU/RSV plus assay is intended as an aid in the diagnosis of influenza from Nasopharyngeal swab specimens and should not be used as a sole basis for treatment. Nasal washings and aspirates are unacceptable for Xpert Xpress SARS-CoV-2/FLU/RSV testing.  Fact Sheet for Patients: BloggerCourse.com  Fact Sheet for Healthcare Providers: SeriousBroker.it  This test is not yet approved or cleared by the [place] FDA and has been authorized for detection and/or diagnosis of SARS-CoV-2 by FDA under an Emergency Use Authorization (EUA). This EUA will remain in effect (meaning this test can be used) for the duration of the COVID-19 declaration under Section 564(b)(1) of the Act, 21 U.S.C. section 360bbb-3(b)(1), unless the authorization is terminated or revoked.  Performed at [facility], [address] [place], [place] [address]   Resp Panel by RT-PCR (Flu A&B, Covid) Nasopharyngeal Swab     Status: None   Collection Time: 03/09/20  2:27 PM   Specimen: Nasopharyngeal Swab; Nasopharyngeal(NP) swabs in vial transport medium  Result Value  Ref Range Status   SARS Coronavirus 2 by RT PCR NEGATIVE NEGATIVE Final    Comment: (NOTE) SARS-CoV-2 target nucleic acids are NOT DETECTED.  The SARS-CoV-2 RNA is generally detectable in upper respiratory specimens during the acute phase of infection. The lowest concentration of SARS-CoV-2 viral copies this assay can detect is 138 copies/mL. A negative result does not preclude SARS-Cov-2 infection and should not be used as the sole basis for treatment or other patient management decisions. A negative result may occur with  improper specimen collection/handling, submission of specimen other than nasopharyngeal swab, presence of viral mutation(s)  within the areas targeted by this assay, and inadequate number of viral copies(<138 copies/mL). A negative result must be combined with clinical observations, patient history, and epidemiological information. The expected result is Negative.  Fact Sheet for Patients:  BloggerCourse.com  Fact Sheet for Healthcare Providers:  SeriousBroker.it  This test is no t yet approved or cleared by the [place] FDA and  has been authorized for detection and/or diagnosis of SARS-CoV-2 by FDA under an Emergency Use Authorization (EUA). This EUA will remain  in effect (meaning this test can be used) for the duration of the COVID-19 declaration under Section 564(b)(1) of the Act, 21 U.S.C.section 360bbb-3(b)(1), unless the authorization is terminated  or revoked sooner.       Influenza A by PCR NEGATIVE NEGATIVE Final   Influenza B by PCR NEGATIVE NEGATIVE Final    Comment: (NOTE) The Xpert Xpress SARS-CoV-2/FLU/RSV plus assay is intended as an aid in the diagnosis of influenza from Nasopharyngeal swab specimens and should not be used as a sole basis for treatment. Nasal washings and aspirates are unacceptable for Xpert Xpress SARS-CoV-2/FLU/RSV testing.  Fact Sheet for Patients: BloggerCourse.com  Fact Sheet for Healthcare Providers: SeriousBroker.it  This test is not yet approved or cleared by the [place] FDA and has been authorized for detection and/or diagnosis of SARS-CoV-2 by FDA under an Emergency Use Authorization (EUA). This EUA will remain in effect (meaning this test can be used) for the duration of the COVID-19 declaration under Section 564(b)(1) of the Act, 21 U.S.C. section 360bbb-3(b)(1), unless the authorization is terminated or revoked.  Performed at [facility], [address], [place], [place] [address]   Culture, blood (routine x 2)     Status: None  (Preliminary result)   Collection Time: 03/09/20  6:41 PM   Specimen: BLOOD  Result Value Ref Range Status   Specimen Description BLOOD SITE NOT SPECIFIED  Final   Special Requests   Final    BOTTLES DRAWN AEROBIC AND ANAEROBIC Blood Culture results may not be optimal due to an inadequate volume of blood received in culture bottles   Culture   Final    NO GROWTH < 24 HOURS Performed at [facility], [address] [place], [place] [address]    Report Status PENDING  Incomplete  Culture, blood (routine x 2)     Status: None (Preliminary result)   Collection Time: 03/09/20  6:42 PM   Specimen: BLOOD  Result Value Ref Range Status   Specimen Description BLOOD SITE NOT SPECIFIED  Final   Special Requests   Final    BOTTLES DRAWN AEROBIC AND ANAEROBIC Blood Culture adequate volume   Culture   Final    NO GROWTH < 24 HOURS Performed at [facility], [address], [place], [place] [address]    Report Status PENDING  Incomplete     Signed:  Correll Denbow  [facility] 03/11/2020, 12:16 PM

## 2020-03-11 NOTE — TOC Transition Note (Signed)
Transition of Care [facility]) - CM/SW Discharge Note   Patient Details  Name: Victoria Levine MRN: [id number] Date of Birth: 1950-03-28  Transition of Care [facility]) CM/SW Contact:  Coralee Pesa, [place] Phone Number: 03/11/2020, 12:58 PM   Clinical Narrative:     room 128. number to call report is [phone].    Final next level of care: [place] Barriers to Discharge: Barriers Resolved   Patient Goals and CMS Choice        Discharge Placement              Patient chooses bed at: [facility] Patient to be transferred to facility by: [place] Name of family member notified: Loney Loh Patient and family notified of of transfer: 03/11/20  Discharge Plan and Services                                     Social Determinants of Health (SDOH) Interventions     Readmission Risk Interventions Readmission Risk Prevention Plan 02/25/2020  Transportation Screening Complete  Medication Review ([place]) Complete  PCP or Specialist appointment within 3-5 days of discharge Complete  SW Recovery Care/Counseling Consult Complete  Skilled Nursing Facility Complete  Some recent data might be hidden

## 2020-03-11 NOTE — Progress Notes (Addendum)
Called facility, gave report to [facility], all questions addressed. Pt not in distress, to discharge to SNF via PTAR.   Pt discharged to facility with belongings via PTAR.

## 2020-03-11 NOTE — Evaluation (Signed)
Physical Therapy Evaluation Patient Details Name: Victoria Levine MRN: [id number] DOB: 1950/12/15 Today's Date: 03/11/2020   History of Present Illness  Victoria Levine is a 69 y.o. female with PMHx Suicide attempt, EtOH abuse, Bipolar disorder, Herpes, anxiety DO, degenerative disc disease, GERD, asthma, hypothyroidism, osteoporosis, recent left hip fracture, who presented from SNF on 03/09/2020 secondary to worsening encephalopathy over the past few days.  Clinical Impression  Patient from SNF following AMS and there for L THA. Unable to fully assess level of function at SNF due to impaired cognition. Supervision for bed mobility and min guard for t/f with RW. Patient ambulated 66' with RW and min guard. Patient presents with deficits in cognition, endurance, strength, balance, and mobility. Patient will benefit from skilled PT services to address listed deficits. Recommend SNF following discharge to maximize functional mobility and return to PLOF.     Follow Up Recommendations SNF    Equipment Recommendations  None recommended by PT    Recommendations for Other Services       Precautions / Restrictions Precautions Precautions: Fall Restrictions Weight Bearing Restrictions: No      Mobility  Bed Mobility Overal bed mobility: Needs Assistance Bed Mobility: Supine to Sit;Sit to Supine     Supine to sit: Supervision Sit to supine: Supervision   General bed mobility comments: supervision for safety    Transfers Overall transfer level: Needs assistance Equipment used: Rolling Prarthana Parlin (2 wheeled) Transfers: Sit to/from [facility] Sit to Stand: Min guard;From elevated surface Stand pivot transfers: Min guard       General transfer comment: min guard for safety due to unsteadiness. stand pivot bed<>BSC  Ambulation/Gait Ambulation/Gait assistance: Min guard Gait Distance (Feet): 12 Feet Assistive device: Rolling Nashali Ditmer (2 wheeled) Gait  Pattern/deviations: Step-through pattern;Decreased stride length;Trunk flexed     General Gait Details: unpredictable on when patient needs rest break, but at times able to verbalize when she needs a break  Financial trader Rankin (Stroke Patients Only)       Balance Overall balance assessment: Needs assistance Sitting-balance support: Feet supported;No upper extremity supported Sitting balance-Leahy Scale: Fair     Standing balance support: Bilateral upper extremity supported;During functional activity Standing balance-Leahy Scale: Poor                               Pertinent Vitals/Pain Pain Assessment: Faces Faces Pain Scale: Hurts little more Pain Location: L hip with mobility Pain Descriptors / Indicators: Grimacing;Guarding Pain Intervention(s): Monitored during session    Home Living Family/patient expects to be discharged to:: Skilled nursing facility Living Arrangements: Alone               Additional Comments: per notes, patient has had multiple falls in the past week    Prior Function           Comments: unsure of assistance level from SNF with patient at times stating she couldn't walk more than 5' and in same conversation states she was walking laps at the last place     Hand Dominance        Extremity/Trunk Assessment   Upper Extremity Assessment Upper Extremity Assessment: Generalized weakness    Lower Extremity Assessment Lower Extremity Assessment: Generalized weakness LLE Deficits / Details: >4/5       Communication   Communication: No difficulties  Cognition Arousal/Alertness:  Awake/alert Behavior During Therapy: Anxious Overall Cognitive Status: Impaired/Different from baseline Area of Impairment: Orientation;Attention;Memory;Following commands;Safety/judgement;Problem solving;Awareness                 Orientation Level: Disoriented to;Situation Current Attention  Level: Selective Memory: Decreased short-term memory Following Commands: Follows one step commands with increased time;Follows multi-step commands inconsistently Safety/Judgement: Decreased awareness of safety;Decreased awareness of deficits Awareness: Emergent Problem Solving: Slow processing;Difficulty sequencing;Requires verbal cues General Comments: intermittent confusion to current situation, easily redirected      General Comments      Exercises     Assessment/Plan    PT Assessment Patient needs continued PT services  PT Problem List Decreased strength;Decreased range of motion;Decreased activity tolerance;Decreased mobility;Decreased balance;Decreased safety awareness;Pain       PT Treatment Interventions DME instruction;Gait training;Functional mobility training;Therapeutic activities;Therapeutic exercise;Balance training;Patient/family education    PT Goals (Current goals can be found in the Care Plan section)  Acute Rehab PT Goals Patient Stated Goal: to get better and go home PT Goal Formulation: With patient Time For Goal Achievement: 03/25/20 Potential to Achieve Goals: Fair    Frequency Min 2X/week   Barriers to discharge        Co-evaluation               AM-PAC PT "6 Clicks" Mobility  Outcome Measure Help needed turning from your back to your side while in a flat bed without using bedrails?: A Little Help needed moving from lying on your back to sitting on the side of a flat bed without using bedrails?: A Little Help needed moving to and from a bed to a chair (including a wheelchair)?: A Little Help needed standing up from a chair using your arms (e.g., wheelchair or bedside chair)?: A Little Help needed to walk in hospital room?: A Little Help needed climbing 3-5 steps with a railing? : A Lot 6 Click Score: 17    End of Session Equipment Utilized During Treatment: Gait belt Activity Tolerance: Patient tolerated treatment well Patient left: in  bed;with call bell/phone within reach;with bed alarm set Nurse Communication: Mobility status PT Visit Diagnosis: Difficulty in walking, not elsewhere classified (R26.2);History of falling (Z91.81)    Time: 4970-2637 PT Time Calculation (min) (ACUTE ONLY): 29 min   Charges:   PT Evaluation $PT Eval Moderate Complexity: 1 Mod PT Treatments $Therapeutic Activity: 8-22 mins        Gregor Hams, PT, DPT Acute Rehabilitation Services Pager [phone] Office [phone]   Inez Pilgrim K Allred 03/11/2020, 11:11 AM

## 2020-03-14 LAB — CULTURE, BLOOD (ROUTINE X 2)
Culture: NO GROWTH
Culture: NO GROWTH
Special Requests: ADEQUATE

## 2020-10-20 DIAGNOSIS — F4481 Dissociative identity disorder: Secondary | ICD-10-CM | POA: Diagnosis not present

## 2020-10-20 DIAGNOSIS — F319 Bipolar disorder, unspecified: Secondary | ICD-10-CM | POA: Diagnosis not present

## 2020-10-20 DIAGNOSIS — F4310 Post-traumatic stress disorder, unspecified: Secondary | ICD-10-CM | POA: Diagnosis not present

## 2020-11-16 DIAGNOSIS — Z6827 Body mass index (BMI) 27.0-27.9, adult: Secondary | ICD-10-CM | POA: Diagnosis not present

## 2020-11-16 DIAGNOSIS — M47816 Spondylosis without myelopathy or radiculopathy, lumbar region: Secondary | ICD-10-CM | POA: Diagnosis not present

## 2020-11-16 DIAGNOSIS — F1120 Opioid dependence, uncomplicated: Secondary | ICD-10-CM | POA: Diagnosis not present

## 2020-11-16 DIAGNOSIS — Z79899 Other long term (current) drug therapy: Secondary | ICD-10-CM | POA: Diagnosis not present

## 2020-11-16 DIAGNOSIS — R030 Elevated blood-pressure reading, without diagnosis of hypertension: Secondary | ICD-10-CM | POA: Diagnosis not present

## 2020-11-16 DIAGNOSIS — G894 Chronic pain syndrome: Secondary | ICD-10-CM | POA: Diagnosis not present

## 2020-12-16 DIAGNOSIS — Z79899 Other long term (current) drug therapy: Secondary | ICD-10-CM | POA: Diagnosis not present

## 2020-12-16 DIAGNOSIS — M47816 Spondylosis without myelopathy or radiculopathy, lumbar region: Secondary | ICD-10-CM | POA: Diagnosis not present

## 2020-12-16 DIAGNOSIS — F1120 Opioid dependence, uncomplicated: Secondary | ICD-10-CM | POA: Diagnosis not present

## 2020-12-16 DIAGNOSIS — G894 Chronic pain syndrome: Secondary | ICD-10-CM | POA: Diagnosis not present

## 2020-12-16 DIAGNOSIS — R030 Elevated blood-pressure reading, without diagnosis of hypertension: Secondary | ICD-10-CM | POA: Diagnosis not present

## 2020-12-20 DIAGNOSIS — Z03818 Encounter for observation for suspected exposure to other biological agents ruled out: Secondary | ICD-10-CM | POA: Diagnosis not present

## 2020-12-20 DIAGNOSIS — R059 Cough, unspecified: Secondary | ICD-10-CM | POA: Diagnosis not present

## 2020-12-20 DIAGNOSIS — R0602 Shortness of breath: Secondary | ICD-10-CM | POA: Diagnosis not present

## 2020-12-20 DIAGNOSIS — J4521 Mild intermittent asthma with (acute) exacerbation: Secondary | ICD-10-CM | POA: Diagnosis not present

## 2020-12-20 DIAGNOSIS — R5383 Other fatigue: Secondary | ICD-10-CM | POA: Diagnosis not present

## 2020-12-20 DIAGNOSIS — J40 Bronchitis, not specified as acute or chronic: Secondary | ICD-10-CM | POA: Diagnosis not present

## 2021-01-04 ENCOUNTER — Other Ambulatory Visit: Payer: Self-pay

## 2021-01-04 ENCOUNTER — Emergency Department (HOSPITAL_BASED_OUTPATIENT_CLINIC_OR_DEPARTMENT_OTHER): Payer: Medicare HMO

## 2021-01-04 ENCOUNTER — Encounter (HOSPITAL_BASED_OUTPATIENT_CLINIC_OR_DEPARTMENT_OTHER): Payer: Self-pay | Admitting: Emergency Medicine

## 2021-01-04 ENCOUNTER — Emergency Department (HOSPITAL_BASED_OUTPATIENT_CLINIC_OR_DEPARTMENT_OTHER)
Admission: EM | Admit: 2021-01-04 | Discharge: 2021-01-04 | Disposition: A | Discharge status: Home / Self Care | Payer: Medicare HMO | Attending: Emergency Medicine | Admitting: Emergency Medicine

## 2021-01-04 DIAGNOSIS — S0012XA Contusion of left eyelid and periocular area, initial encounter: Secondary | ICD-10-CM | POA: Insufficient documentation

## 2021-01-04 DIAGNOSIS — E039 Hypothyroidism, unspecified: Secondary | ICD-10-CM | POA: Diagnosis not present

## 2021-01-04 DIAGNOSIS — S0990XA Unspecified injury of head, initial encounter: Secondary | ICD-10-CM | POA: Diagnosis not present

## 2021-01-04 DIAGNOSIS — S0033XA Contusion of nose, initial encounter: Secondary | ICD-10-CM | POA: Diagnosis not present

## 2021-01-04 DIAGNOSIS — S0993XA Unspecified injury of face, initial encounter: Secondary | ICD-10-CM | POA: Diagnosis not present

## 2021-01-04 DIAGNOSIS — Z79899 Other long term (current) drug therapy: Secondary | ICD-10-CM | POA: Insufficient documentation

## 2021-01-04 DIAGNOSIS — J45909 Unspecified asthma, uncomplicated: Secondary | ICD-10-CM | POA: Diagnosis not present

## 2021-01-04 DIAGNOSIS — Y92009 Unspecified place in unspecified non-institutional (private) residence as the place of occurrence of the external cause: Secondary | ICD-10-CM | POA: Diagnosis not present

## 2021-01-04 DIAGNOSIS — W19XXXA Unspecified fall, initial encounter: Secondary | ICD-10-CM

## 2021-01-04 DIAGNOSIS — Z96642 Presence of left artificial hip joint: Secondary | ICD-10-CM | POA: Diagnosis not present

## 2021-01-04 DIAGNOSIS — W01198A Fall on same level from slipping, tripping and stumbling with subsequent striking against other object, initial encounter: Secondary | ICD-10-CM | POA: Diagnosis not present

## 2021-01-04 NOTE — ED Provider Notes (Signed)
[place] EMERGENCY DEPT Provider Note   CSN: [id number] Arrival date & time: 01/04/21  1304     History Chief Complaint  Patient presents with   Lytle Michaels    Victoria Levine is a 70 y.o. female.   Fall Pertinent negatives include no chest pain, no abdominal pain, no headaches and no shortness of breath. Patient presents for evaluation of facial injury.  2 days ago, she was at home and tripped over her cat.  This caused her to lose her balance and fall to the floor.  She struck her face on the floor and subsequently developed left periorbital swelling and bruising.  She denies losing consciousness at the time of the fall.  She denies any areas of pain or suspected injury other than the area around her left eye.  Swelling, over the past 2 days, has diminished.  Area of ecchymosis has increased.  She has tenderness to the lateral orbital rim.  She was seen by her primary care doctor who advised her to come to the ED for imaging studies.  She denies any visual disturbances or pain with EOM.     Past Medical History:  Diagnosis Date   Anemia    low iron   Anxiety    Asthma    Bipolar disorder (HCC)    Chronic back pain    Compression fracture of L2 lumbar vertebra (HCC)    DDD (degenerative disc disease)    Depression    Dyspnea    GERD (gastroesophageal reflux disease)    H/O alcohol abuse    28 years sober   H/O suicide attempt    Herpes    History of hiatal hernia    Hypothyroidism    Migraines    Osteoporosis 11/2018   T score of -2.6   Pneumonia    PONV (postoperative nausea and vomiting)    S/P adenoidectomy    S/P tonsillectomy     Patient Active Problem List   Diagnosis Date Noted   Encephalopathy 03/09/2020   Pressure injury of skin 40/98/1191   Acute metabolic encephalopathy 47/82/9562   Displaced intertrochanteric fracture of left femur, initial encounter for closed fracture ([place]) 02/23/2020   Hypokalemia 02/23/2020   Intractable nausea  and vomiting 08/31/2019   Epigastric pain 08/31/2019   Alcohol abuse 08/31/2019   Microcytic anemia 08/31/2019   Closed compression fracture of L2 vertebra ([place]) 12/11/2017   Pain 07/26/2015   Anxiety state 06/28/2015   Benzodiazepine dependence ([place]) 10/13/2014   Opioid abuse with opioid-induced mood disorder ([place]) 10/13/2014   Bipolar 1 disorder, depressed ([place]) 10/12/2014   Spondylolisthesis of lumbar region 07/28/2014   Osteoporosis 11/17/2013   Migraines    Herpes    Hypothyroidism 08/13/2008   HYPERCHOLESTEROLEMIA 08/13/2008   Bipolar disorder ([place]) 08/13/2008   Asthma-chronic obstructive pulmonary disease overlap syndrome ([place]) 08/13/2008   GERD (gastroesophageal reflux disease) 08/13/2008   HIATAL HERNIA 08/13/2008   ALLERGY 08/13/2008    Past Surgical History:  Procedure Laterality Date   CESAREAN SECTION     COLONOSCOPY     as a teenager   Compression fracture spine     DILATION AND CURETTAGE OF UTERUS     EYE SURGERY Bilateral    CATARACTS REMOVED, NO LENS PLACED   HYSTEROSCOPY     KYPHOPLASTY N/A 12/19/2016   Procedure: THORACIC 12 KYPHOPLASTY; REQUEST 60 MINS AND FLIP ROOM;  Surgeon: Phylliss Bob, MD;  Location: [facility];  Service: Orthopedics;  Laterality: N/A;  THORACIC 12 KYPHOPLASTY;  REQUEST 60 MINS AND FLIP ROOM   KYPHOPLASTY N/A 12/11/2017   Procedure: LUMBAR 2 KYPHOPLASTY;  Surgeon: Phylliss Bob, MD;  Location: [facility];  Service: Orthopedics;  Laterality: N/A;   LUMBAR FUSION     TONSILLECTOMY     TOTAL HIP ARTHROPLASTY Left 02/24/2020   Procedure: TOTAL HIP ARTHROPLASTY ANTERIOR APPROACH;  Surgeon: Rod Can, MD;  Location: [facility];  Service: Orthopedics;  Laterality: Left;     OB History     Gravida  3   Para  1   Term  1   Preterm      AB  2   Living  1      SAB      IAB      Ectopic      Multiple      Live Births              Family History  Adopted: Yes    Social History   Tobacco Use   Smoking status: Never    Smokeless tobacco: Never  Vaping Use   Vaping Use: Never used  Substance Use Topics   Alcohol use: Yes    Alcohol/week: 0.0 standard drinks   Drug use: Not Currently    Types: Benzodiazepines, Cocaine    Home Medications Prior to Admission medications   Medication Sig Start Date End Date Taking? Authorizing Provider  albuterol (PROVENTIL HFA;VENTOLIN HFA) 108 (90 Base) MCG/ACT inhaler Inhale 2 puffs into the lungs every 6 (six) hours as needed for wheezing or shortness of breath.    [provider]  budesonide (PULMICORT) 0.25 MG/2ML nebulizer solution Take 2 mLs (0.25 mg total) by nebulization 2 (two) times daily. 03/02/20   Lorella Nimrod, MD  buPROPion (WELLBUTRIN XL) 150 MG 24 hr tablet Take 300 mg by mouth daily.  10/01/16   [provider]  calcium citrate-vitamin D (CALCIUM + D) 315-200 MG-UNIT per tablet Take 1 tablet by mouth daily. For bone health Patient taking differently: Take 1 tablet by mouth daily. 10/18/14   Lindell Spar I, NP  cyclobenzaprine (FLEXERIL) 10 MG tablet Take 10 mg by mouth every 8 (eight) hours as needed for muscle spasms.    [provider]  denosumab (PROLIA) 60 MG/ML SOLN injection Inject 60 mg into the skin every 6 (six) months. Administer in upper arm, thigh, or abdomen  NEXT IS DUE 03-2017 Patient not taking: Reported on 03/09/2020 03/05/17   [name], NP  dicyclomine (BENTYL) 20 MG tablet Take 1 tablet (20 mg total) by mouth every 8 (eight) hours as needed for spasms. 12/31/19   Petrucelli, Samantha R, PA-C  docusate sodium (COLACE) 100 MG capsule Take 1 capsule (100 mg total) by mouth 2 (two) times daily. Patient taking differently: Take 100 mg by mouth daily. 03/02/20   Lorella Nimrod, MD  enoxaparin (LOVENOX) 40 MG/0.4ML injection Inject 0.4 mLs (40 mg total) into the skin daily. 02/27/20 03/28/20  Cherlynn June B, PA  EPINEPHrine 0.3 mg/0.3 mL IJ SOAJ injection Inject 0.3 mg into the skin daily as needed for anaphylaxis.   04/10/19   [provider]  famotidine (PEPCID) 20 MG tablet Take 1 tablet (20 mg total) by mouth at bedtime. 09/03/19 02/23/20  Amin, Jeanella Flattery, MD  feeding supplement (ENSURE ENLIVE / ENSURE PLUS) LIQD Take 237 mLs by mouth daily. 03/03/20   Lorella Nimrod, MD  Ferrous Fumarate-Folic Acid 268-3 MG TABS Take 1 tablet by mouth 2 (two) times daily after a meal.  [provider]  FLUoxetine (PROZAC) 20 MG capsule Take 20 mg by mouth 2 (two) times daily.     [provider]  levothyroxine (SYNTHROID, LEVOTHROID) 112 MCG tablet Take 112 mcg by mouth daily before breakfast.  09/04/17   [provider]  magnesium oxide (MAG-OX) 400 (241.3 Mg) MG tablet Take 1 tablet (400 mg total) by mouth daily. 03/03/20   Lorella Nimrod, MD  Multiple Vitamin (MULTIVITAMIN) tablet Take 1 tablet by mouth daily. For low vitamin 10/18/14   Lindell Spar I, NP  naproxen sodium (ALEVE) 220 MG tablet Take 220 mg by mouth every 12 (twelve) hours as needed (pain).    [provider]  ondansetron (ZOFRAN ODT) 4 MG disintegrating tablet Take 1 tablet (4 mg total) by mouth every 8 (eight) hours as needed for nausea or vomiting. 12/18/19   Joy, Shawn C, PA-C  oxcarbazepine (TRILEPTAL) 600 MG tablet Take 600 mg by mouth daily.     [provider]  oxyCODONE ER 13.5 MG C12A Take 13.5 mg by mouth 2 (two) times daily. 11/22/16   [provider]  Oxycodone HCl 10 MG TABS Take 1 tablet (10 mg total) by mouth every 4 (four) hours as needed (for breakthrough pain). 02/26/20   Dorothyann Peng, PA  pantoprazole (PROTONIX) 40 MG tablet Take 1 tablet (40 mg total) by mouth 2 (two) times daily before a meal. 09/03/19   Amin, Jeanella Flattery, MD  polyethylene glycol (MIRALAX / GLYCOLAX) 17 g packet Take 17 g by mouth daily as needed for moderate constipation or severe constipation. 09/03/19   Amin, Jeanella Flattery, MD  QUEtiapine (SEROQUEL) 100 MG tablet Take 1 tablet (100 mg total) by mouth at  bedtime as needed (sleep). 03/02/20   Lorella Nimrod, MD  rizatriptan (MAXALT-MLT) 10 MG disintegrating tablet Take 10 mg by mouth daily as needed for migraine.  11/08/14   [provider]  sucralfate (CARAFATE) 1 g tablet Take 1 tablet (1 g total) by mouth 4 (four) times daily -  with meals and at bedtime. 12/18/19 02/23/20  Joy, Shawn C, PA-C  thiamine 100 MG tablet Take 1 tablet (100 mg total) by mouth daily. 03/03/20   Lorella Nimrod, MD  valACYclovir (VALTREX) 500 MG tablet Take one tablet twice daily 3-5 days then daily as needed. Patient taking differently: Take by mouth See admin instructions. Take 1 tablet (500mg ) by mouth twice daily for 3-5 days then daily as needed for herpes virus infection. 05/01/17   [name], NP    Allergies    Prednisone, Lithium carbonate, and Sulfa antibiotics  Review of Systems   Review of Systems  Constitutional:  Negative for activity change, appetite change, chills, fatigue and fever.  HENT:  Positive for facial swelling. Negative for congestion, ear pain, nosebleeds, rhinorrhea, sore throat, trouble swallowing and voice change.   Eyes:  Negative for photophobia, pain and visual disturbance.  Respiratory:  Negative for cough, chest tightness and shortness of breath.   Cardiovascular:  Negative for chest pain and palpitations.  Gastrointestinal:  Negative for abdominal pain, nausea and vomiting.  Genitourinary:  Negative for dysuria, flank pain, hematuria and pelvic pain.  Musculoskeletal:  Negative for arthralgias, back pain, gait problem, joint swelling, myalgias and neck pain.  Skin:  Negative for pallor and rash.  Neurological:  Negative for dizziness, seizures, syncope, speech difficulty, weakness, light-headedness, numbness and headaches.  Psychiatric/Behavioral:  Negative for confusion and decreased concentration.   All other systems reviewed and are negative.  Physical Exam Updated Vital Signs BP (!) 107/92   Pulse 92   Temp 98.8  F (37.1 C) (Oral)   Resp 18   Ht 5\' 5"  (1.651 m)   Wt 72.6 kg   SpO2 98%   BMI 26.63 kg/m   Physical Exam Vitals and nursing note reviewed.  Constitutional:      General: She is not in acute distress.    Appearance: Normal appearance. She is well-developed. She is not ill-appearing, toxic-appearing or diaphoretic.  HENT:     Head: Normocephalic.     Right Ear: External ear normal.     Left Ear: External ear normal.     Nose:     Comments: Bruising without swelling to nasal bridge Eyes:     General: No scleral icterus.    Extraocular Movements: Extraocular movements intact.     Conjunctiva/sclera: Conjunctivae normal.     Pupils: Pupils are equal, round, and reactive to light.     Comments: Left periorbital swelling and ecchymosis.  Cardiovascular:     Rate and Rhythm: Normal rate and regular rhythm.     Heart sounds: No murmur heard. Pulmonary:     Effort: Pulmonary effort is normal. No respiratory distress.     Breath sounds: Normal breath sounds.  Chest:     Chest wall: No tenderness.  Abdominal:     Palpations: Abdomen is soft.     Tenderness: There is no abdominal tenderness.  Musculoskeletal:        General: No deformity. Normal range of motion.     Cervical back: Normal range of motion and neck supple. No tenderness.  Skin:    General: Skin is warm and dry.     Coloration: Skin is not jaundiced or pale.     Findings: Bruising present.  Neurological:     General: No focal deficit present.     Mental Status: She is alert and oriented to person, place, and time.     Cranial Nerves: No cranial nerve deficit.     Sensory: No sensory deficit.     Motor: No weakness.     Coordination: Coordination normal.     Gait: Gait normal.  Psychiatric:        Mood and Affect: Mood normal.        Behavior: Behavior normal.    ED Results / Procedures / Treatments   Labs (all labs ordered are listed, but only abnormal results are displayed) Labs Reviewed - No data to  display  EKG None  Radiology CT HEAD WO CONTRAST (5MM)  Result Date: 01/04/2021 CLINICAL DATA:  [place] 2 days ago with trauma to the head and face. EXAM: CT HEAD WITHOUT CONTRAST TECHNIQUE: Contiguous axial images were obtained from the base of the skull through the vertex without intravenous contrast. COMPARISON:  03/09/2020 FINDINGS: Brain: Mild age related volume loss. No evidence of old or acute focal infarction, mass lesion, hemorrhage, hydrocephalus or extra-axial collection. Vascular: There is atherosclerotic calcification of the major vessels at the base of the brain. Skull: Negative Sinuses/Orbits: Clear/normal Other: None IMPRESSION: No acute or traumatic finding.  Mild age related volume loss. Electronically Signed   By: Nelson Chimes M.D.   On: 01/04/2021 14:44   CT Maxillofacial Wo Contrast  Result Date: 01/04/2021 CLINICAL DATA:  [place] 2 days ago with facial injury and bruising. EXAM: CT MAXILLOFACIAL WITHOUT CONTRAST TECHNIQUE: Multidetector CT imaging of the maxillofacial structures was performed. Multiplanar CT image reconstructions were also generated. COMPARISON:  None.  FINDINGS: Osseous: No facial fracture or focal bone lesion. Orbits: No evidence of orbital injury. Sinuses: Sinuses are clear. No traumatic fluid or inflammatory changes. Soft tissues: No soft tissue finding of note by CT. Limited intracranial: Normal IMPRESSION: Negative CT scan of the face. No evidence of fracture or orbital injury. Electronically Signed   By: Nelson Chimes M.D.   On: 01/04/2021 14:43    Procedures Procedures   Medications Ordered in ED Medications - No data to display  ED Course  I have reviewed the triage vital signs and the nursing notes.  Pertinent labs & imaging results that were available during my care of the patient were reviewed by me and considered in my medical decision making (see chart for details).    MDM Rules/Calculators/A&P                          Patient is a  70 year old female who presents to the ED upon the advice of her primary care doctor for facial CT imaging.  2 days ago, she suffered a fall at home and struck her face.  She had left periorbital swelling.  This was shown to me on her cell phone.  When compared to previous photos, swelling has subsided and patient has periorbital ecchymosis remaining.  She denies any LOC with the fall.  He denies any current areas of discomfort.  On exam, she has no focal neurologic deficits.  Prior to being bedded in the ED, CT scans were obtained of head and face.  Results were negative for acute fractures.  Patient was informed of imaging results.  At this point, patient request discharge home.  Given that she has had minimal symptoms over the past 2 days and has a reassuring exam and negative imaging today, I feel that this is appropriate.  Patient was discharged in good condition.  Final Clinical Impression(s) / ED Diagnoses Final diagnoses:  Fall, initial encounter    Rx / DC Orders ED Discharge Orders     None        Godfrey Pick, MD 01/05/21 2137

## 2021-01-04 NOTE — ED Triage Notes (Signed)
Reports she had an incident with her cat two days ago causing her to trip and fell to the floor.  Has significant bruising to face. Worse on left eye.  Seen by PCP today.  Sent here for imaging.

## 2021-01-13 ENCOUNTER — Emergency Department (HOSPITAL_COMMUNITY): Payer: Medicare HMO

## 2021-01-13 ENCOUNTER — Inpatient Hospital Stay (HOSPITAL_COMMUNITY)
Admission: EM | Admit: 2021-01-13 | Discharge: 2021-01-17 | DRG: 640 | Disposition: A | Discharge status: Home / Self Care | Payer: Medicare HMO | Attending: Internal Medicine | Admitting: Internal Medicine

## 2021-01-13 ENCOUNTER — Encounter (HOSPITAL_COMMUNITY): Payer: Self-pay

## 2021-01-13 ENCOUNTER — Other Ambulatory Visit: Payer: Self-pay

## 2021-01-13 DIAGNOSIS — R739 Hyperglycemia, unspecified: Secondary | ICD-10-CM | POA: Diagnosis present

## 2021-01-13 DIAGNOSIS — E039 Hypothyroidism, unspecified: Secondary | ICD-10-CM | POA: Diagnosis not present

## 2021-01-13 DIAGNOSIS — E860 Dehydration: Principal | ICD-10-CM

## 2021-01-13 DIAGNOSIS — L89151 Pressure ulcer of sacral region, stage 1: Secondary | ICD-10-CM | POA: Diagnosis present

## 2021-01-13 DIAGNOSIS — E876 Hypokalemia: Secondary | ICD-10-CM | POA: Diagnosis not present

## 2021-01-13 DIAGNOSIS — R4182 Altered mental status, unspecified: Secondary | ICD-10-CM | POA: Diagnosis not present

## 2021-01-13 DIAGNOSIS — R1111 Vomiting without nausea: Secondary | ICD-10-CM | POA: Diagnosis not present

## 2021-01-13 DIAGNOSIS — S22060A Wedge compression fracture of T7-T8 vertebra, initial encounter for closed fracture: Secondary | ICD-10-CM | POA: Diagnosis not present

## 2021-01-13 DIAGNOSIS — E872 Acidosis, unspecified: Secondary | ICD-10-CM | POA: Diagnosis not present

## 2021-01-13 DIAGNOSIS — Z981 Arthrodesis status: Secondary | ICD-10-CM

## 2021-01-13 DIAGNOSIS — R197 Diarrhea, unspecified: Secondary | ICD-10-CM | POA: Diagnosis not present

## 2021-01-13 DIAGNOSIS — R9431 Abnormal electrocardiogram [ECG] [EKG]: Secondary | ICD-10-CM | POA: Diagnosis not present

## 2021-01-13 DIAGNOSIS — Z66 Do not resuscitate: Secondary | ICD-10-CM | POA: Diagnosis not present

## 2021-01-13 DIAGNOSIS — Z9841 Cataract extraction status, right eye: Secondary | ICD-10-CM

## 2021-01-13 DIAGNOSIS — R110 Nausea: Secondary | ICD-10-CM | POA: Diagnosis not present

## 2021-01-13 DIAGNOSIS — K8020 Calculus of gallbladder without cholecystitis without obstruction: Secondary | ICD-10-CM | POA: Diagnosis not present

## 2021-01-13 DIAGNOSIS — R531 Weakness: Secondary | ICD-10-CM | POA: Diagnosis present

## 2021-01-13 DIAGNOSIS — G9341 Metabolic encephalopathy: Secondary | ICD-10-CM | POA: Diagnosis present

## 2021-01-13 DIAGNOSIS — R1011 Right upper quadrant pain: Secondary | ICD-10-CM | POA: Diagnosis not present

## 2021-01-13 DIAGNOSIS — Z888 Allergy status to other drugs, medicaments and biological substances status: Secondary | ICD-10-CM

## 2021-01-13 DIAGNOSIS — Z7951 Long term (current) use of inhaled steroids: Secondary | ICD-10-CM

## 2021-01-13 DIAGNOSIS — R7401 Elevation of levels of liver transaminase levels: Secondary | ICD-10-CM | POA: Diagnosis not present

## 2021-01-13 DIAGNOSIS — D72829 Elevated white blood cell count, unspecified: Secondary | ICD-10-CM | POA: Diagnosis not present

## 2021-01-13 DIAGNOSIS — G319 Degenerative disease of nervous system, unspecified: Secondary | ICD-10-CM | POA: Diagnosis not present

## 2021-01-13 DIAGNOSIS — N179 Acute kidney failure, unspecified: Principal | ICD-10-CM | POA: Diagnosis present

## 2021-01-13 DIAGNOSIS — S0990XA Unspecified injury of head, initial encounter: Secondary | ICD-10-CM | POA: Diagnosis not present

## 2021-01-13 DIAGNOSIS — Z882 Allergy status to sulfonamides status: Secondary | ICD-10-CM

## 2021-01-13 DIAGNOSIS — Z20822 Contact with and (suspected) exposure to covid-19: Secondary | ICD-10-CM | POA: Diagnosis not present

## 2021-01-13 DIAGNOSIS — Z79890 Hormone replacement therapy (postmenopausal): Secondary | ICD-10-CM

## 2021-01-13 DIAGNOSIS — M810 Age-related osteoporosis without current pathological fracture: Secondary | ICD-10-CM | POA: Diagnosis present

## 2021-01-13 DIAGNOSIS — K219 Gastro-esophageal reflux disease without esophagitis: Secondary | ICD-10-CM | POA: Diagnosis present

## 2021-01-13 DIAGNOSIS — Z9842 Cataract extraction status, left eye: Secondary | ICD-10-CM

## 2021-01-13 DIAGNOSIS — I639 Cerebral infarction, unspecified: Secondary | ICD-10-CM | POA: Diagnosis not present

## 2021-01-13 DIAGNOSIS — Z96642 Presence of left artificial hip joint: Secondary | ICD-10-CM | POA: Diagnosis present

## 2021-01-13 DIAGNOSIS — K5730 Diverticulosis of large intestine without perforation or abscess without bleeding: Secondary | ICD-10-CM | POA: Diagnosis not present

## 2021-01-13 DIAGNOSIS — F419 Anxiety disorder, unspecified: Secondary | ICD-10-CM | POA: Diagnosis present

## 2021-01-13 DIAGNOSIS — D75839 Thrombocytosis, unspecified: Secondary | ICD-10-CM | POA: Diagnosis not present

## 2021-01-13 DIAGNOSIS — J45909 Unspecified asthma, uncomplicated: Secondary | ICD-10-CM | POA: Diagnosis not present

## 2021-01-13 DIAGNOSIS — A419 Sepsis, unspecified organism: Secondary | ICD-10-CM | POA: Diagnosis not present

## 2021-01-13 DIAGNOSIS — R6510 Systemic inflammatory response syndrome (SIRS) of non-infectious origin without acute organ dysfunction: Secondary | ICD-10-CM | POA: Diagnosis not present

## 2021-01-13 DIAGNOSIS — R112 Nausea with vomiting, unspecified: Secondary | ICD-10-CM | POA: Diagnosis not present

## 2021-01-13 DIAGNOSIS — I6529 Occlusion and stenosis of unspecified carotid artery: Secondary | ICD-10-CM | POA: Diagnosis not present

## 2021-01-13 DIAGNOSIS — K449 Diaphragmatic hernia without obstruction or gangrene: Secondary | ICD-10-CM | POA: Diagnosis not present

## 2021-01-13 DIAGNOSIS — K760 Fatty (change of) liver, not elsewhere classified: Secondary | ICD-10-CM | POA: Diagnosis present

## 2021-01-13 DIAGNOSIS — Z79899 Other long term (current) drug therapy: Secondary | ICD-10-CM

## 2021-01-13 DIAGNOSIS — I959 Hypotension, unspecified: Secondary | ICD-10-CM | POA: Diagnosis not present

## 2021-01-13 LAB — URINALYSIS, ROUTINE W REFLEX MICROSCOPIC
Bilirubin Urine: NEGATIVE
Glucose, UA: NEGATIVE mg/dL
Hgb urine dipstick: NEGATIVE
Ketones, ur: NEGATIVE mg/dL
Leukocytes,Ua: NEGATIVE
Nitrite: NEGATIVE
Protein, ur: NEGATIVE mg/dL
Specific Gravity, Urine: 1.015 (ref 1.005–1.030)
pH: 5 (ref 5.0–8.0)

## 2021-01-13 LAB — PROTIME-INR
INR: 1.2 (ref 0.8–1.2)
INR: 1.2 (ref 0.8–1.2)
Prothrombin Time: 14.9 seconds (ref 11.4–15.2)
Prothrombin Time: 15.5 seconds — ABNORMAL HIGH (ref 11.4–15.2)

## 2021-01-13 LAB — COMPREHENSIVE METABOLIC PANEL WITH GFR
ALT: 374 U/L — ABNORMAL HIGH (ref 0–44)
AST: 148 U/L — ABNORMAL HIGH (ref 15–41)
Albumin: 4.1 g/dL (ref 3.5–5.0)
Alkaline Phosphatase: 136 U/L — ABNORMAL HIGH (ref 38–126)
Anion gap: 22 — ABNORMAL HIGH (ref 5–15)
BUN: 169 mg/dL — ABNORMAL HIGH (ref 8–23)
CO2: 17 mmol/L — ABNORMAL LOW (ref 22–32)
Calcium: 11.4 mg/dL — ABNORMAL HIGH (ref 8.9–10.3)
Chloride: 105 mmol/L (ref 98–111)
Creatinine, Ser: 3.86 mg/dL — ABNORMAL HIGH (ref 0.44–1.00)
GFR, Estimated: 12 mL/min — ABNORMAL LOW
Glucose, Bld: 221 mg/dL — ABNORMAL HIGH (ref 70–99)
Potassium: 3.4 mmol/L — ABNORMAL LOW (ref 3.5–5.1)
Sodium: 144 mmol/L (ref 135–145)
Total Bilirubin: 2.5 mg/dL — ABNORMAL HIGH (ref 0.3–1.2)
Total Protein: 7.9 g/dL (ref 6.5–8.1)

## 2021-01-13 LAB — RAPID URINE DRUG SCREEN, HOSP PERFORMED
Amphetamines: NOT DETECTED
Barbiturates: NOT DETECTED
Benzodiazepines: NOT DETECTED
Cocaine: NOT DETECTED
Opiates: NOT DETECTED
Tetrahydrocannabinol: NOT DETECTED

## 2021-01-13 LAB — CBC WITH DIFFERENTIAL/PLATELET
Abs Immature Granulocytes: 0.23 10*3/uL — ABNORMAL HIGH (ref 0.00–0.07)
Basophils Absolute: 0 10*3/uL (ref 0.0–0.1)
Basophils Relative: 0 %
Eosinophils Absolute: 0 10*3/uL (ref 0.0–0.5)
Eosinophils Relative: 0 %
HCT: 58.7 % — ABNORMAL HIGH (ref 36.0–46.0)
Hemoglobin: 19.3 g/dL — ABNORMAL HIGH (ref 12.0–15.0)
Immature Granulocytes: 1 %
Lymphocytes Relative: 6 %
Lymphs Abs: 1.4 10*3/uL (ref 0.7–4.0)
MCH: 30.8 pg (ref 26.0–34.0)
MCHC: 32.9 g/dL (ref 30.0–36.0)
MCV: 93.8 fL (ref 80.0–100.0)
Monocytes Absolute: 1 10*3/uL (ref 0.1–1.0)
Monocytes Relative: 5 %
Neutro Abs: 18.9 10*3/uL — ABNORMAL HIGH (ref 1.7–7.7)
Neutrophils Relative %: 88 %
Platelets: 438 10*3/uL — ABNORMAL HIGH (ref 150–400)
RBC: 6.26 MIL/uL — ABNORMAL HIGH (ref 3.87–5.11)
RDW: 15.1 % (ref 11.5–15.5)
WBC: 21.6 10*3/uL — ABNORMAL HIGH (ref 4.0–10.5)
nRBC: 0 % (ref 0.0–0.2)

## 2021-01-13 LAB — I-STAT VENOUS BLOOD GAS, ED
Acid-base deficit: 1 mmol/L (ref 0.0–2.0)
Bicarbonate: 23.2 mmol/L (ref 20.0–28.0)
Calcium, Ion: 1.21 mmol/L (ref 1.15–1.40)
HCT: 49 % — ABNORMAL HIGH (ref 36.0–46.0)
Hemoglobin: 16.7 g/dL — ABNORMAL HIGH (ref 12.0–15.0)
O2 Saturation: 98 %
Potassium: 3.3 mmol/L — ABNORMAL LOW (ref 3.5–5.1)
Sodium: 148 mmol/L — ABNORMAL HIGH (ref 135–145)
TCO2: 24 mmol/L (ref 22–32)
pCO2, Ven: 35.6 mmHg — ABNORMAL LOW (ref 44.0–60.0)
pH, Ven: 7.422 (ref 7.250–7.430)
pO2, Ven: 96 mmHg — ABNORMAL HIGH (ref 32.0–45.0)

## 2021-01-13 LAB — CK: Total CK: 29 U/L — ABNORMAL LOW (ref 38–234)

## 2021-01-13 LAB — RESP PANEL BY RT-PCR (FLU A&B, COVID) ARPGX2
Influenza A by PCR: NEGATIVE
Influenza B by PCR: NEGATIVE
SARS Coronavirus 2 by RT PCR: NEGATIVE

## 2021-01-13 LAB — LACTIC ACID, PLASMA
Lactic Acid, Venous: 4.5 mmol/L (ref 0.5–1.9)
Lactic Acid, Venous: 3.6 mmol/L (ref 0.5–1.9)

## 2021-01-13 LAB — TSH: TSH: 15.713 u[IU]/mL — ABNORMAL HIGH (ref 0.350–4.500)

## 2021-01-13 LAB — ETHANOL: Alcohol, Ethyl (B): <10 mg/dL (ref <10)

## 2021-01-13 LAB — APTT
aPTT: 26 seconds (ref 24–36)
aPTT: 23 seconds — ABNORMAL LOW (ref 24–36)

## 2021-01-13 LAB — T4, FREE: Free T4: >5.5 ng/dL — ABNORMAL HIGH (ref 0.61–1.12)

## 2021-01-13 MED ORDER — SODIUM CHLORIDE 0.9 % IV SOLN: 2.0000 g | INTRAVENOUS | Status: DC

## 2021-01-13 MED ORDER — LACTATED RINGERS IV BOLUS (SEPSIS)
1000.0000 mL | Freq: Once | INTRAVENOUS | Status: AC
  Administered 2021-01-13: 1000 mL via INTRAVENOUS

## 2021-01-13 MED ORDER — LACTATED RINGERS IV BOLUS
500.0000 mL | Freq: Once | INTRAVENOUS | Status: AC
  Administered 2021-01-13: 500 mL via INTRAVENOUS

## 2021-01-13 MED ORDER — METRONIDAZOLE 500 MG/100ML IV SOLN
500.0000 mg | Freq: Once | INTRAVENOUS | Status: AC
  Administered 2021-01-13: 500 mg via INTRAVENOUS
  Filled 2021-01-13: qty 100

## 2021-01-13 MED ORDER — LACTATED RINGERS IV BOLUS (SEPSIS)
250.0000 mL | Freq: Once | INTRAVENOUS | Status: AC
  Administered 2021-01-13: 250 mL via INTRAVENOUS

## 2021-01-13 MED ORDER — VANCOMYCIN VARIABLE DOSE PER UNSTABLE RENAL FUNCTION (PHARMACIST DOSING): Status: DC

## 2021-01-13 MED ORDER — SODIUM CHLORIDE 0.9 % IV SOLN
2.0000 g | Freq: Once | INTRAVENOUS | Status: AC
  Administered 2021-01-13: 2 g via INTRAVENOUS
  Filled 2021-01-13: qty 2

## 2021-01-13 MED ORDER — ENOXAPARIN SODIUM 30 MG/0.3ML IJ SOSY
30.0000 mg | PREFILLED_SYRINGE | Freq: Every day | INTRAMUSCULAR | Status: DC
  Administered 2021-01-14 – 2021-01-15 (×3): 30 mg via SUBCUTANEOUS
  Filled 2021-01-13 (×3): qty 0.3

## 2021-01-13 MED ORDER — VANCOMYCIN HCL 1500 MG/300ML IV SOLN
1500.0000 mg | Freq: Once | INTRAVENOUS | Status: AC
  Administered 2021-01-13: 1500 mg via INTRAVENOUS
  Filled 2021-01-13: qty 300

## 2021-01-13 MED ORDER — LACTATED RINGERS IV SOLN: INTRAVENOUS | Status: DC

## 2021-01-13 NOTE — ED Notes (Signed)
1st set of blood cultures obtained my phlebotomy at this time.

## 2021-01-13 NOTE — Progress Notes (Signed)
Pharmacy Antibiotic Note  Victoria Levine is a 70 y.o. female admitted on 01/13/2021 with sepsis.  Pharmacy has been consulted for vancomycin and cefepime dosing.  Plan: Vancomycin 1500mg  x1 then vanc variable given Cr >2 and bsl closer to 0.7 Cefepime 2g IV q24h -Monitor renal function, clinical status, and antibiotic plan -Order vanc levels as necessary  Height: 5\' 5"  (165.1 cm) Weight: 72.6 kg (160 lb) IBW/kg (Calculated) : 57  Temp (24hrs), Avg:93.3 F (34.1 C), Min:90.6 F (32.6 C), Max:96.4 F (35.8 C)  Recent Labs  Lab 01/13/21 1418 01/13/21 1525 01/13/21 1715  WBC 21.6*  --   --   CREATININE 3.86*  --   --   LATICACIDVEN  --  4.5* 3.6*    Estimated Creatinine Clearance: 13.3 mL/min (A) (by C-G formula based on SCr of 3.86 mg/dL (H)).    Allergies  Allergen Reactions   Prednisone Other (See Comments)    Cannot tolerate ORALLY, but can tolerate by shot (interacts with anxiety & bipolarity)   Lithium Carbonate Other (See Comments)    Patient was taken off of this because of a weight gain of 40 pounds   Sulfa Antibiotics Other (See Comments)    From childhood; reaction not recalled    Antimicrobials this admission: Vanc 10/28 >>  Cefepime 10/28 >>   Dose adjustments this admission: N/A  Microbiology results: 10/28 BCx:  10/28 UCx:   Thank you for allowing pharmacy to be a part of this patient's care.  Joetta Manners, PharmD, [facility] Emergency Medicine Clinical Pharmacist ED RPh Phone: [place]: [phone]

## 2021-01-13 NOTE — ED Notes (Signed)
Labs obtained by phlebotomy

## 2021-01-13 NOTE — Sepsis Progress Note (Signed)
Notified bedside nurse of need to draw lactic acid and Blood Cultures.. 

## 2021-01-13 NOTE — ED Notes (Signed)
Pt transported to CT ?

## 2021-01-13 NOTE — ED Notes (Signed)
Bair hugger applied.

## 2021-01-13 NOTE — ED Provider Notes (Signed)
[facility] EMERGENCY DEPARTMENT Provider Note   CSN: [id number] Arrival date & time: 01/13/21  1408     History Chief Complaint  Patient presents with   Code Sepsis    Victoria Levine is a 70 y.o. female with a past medical history significant for iron deficiency anemia, anxiety, asthma, bipolar disorder, chronic back pain, GERD, history of alcohol abuse, and hypothyroidism who presents to the ED as a code sepsis.  Per EMS, found in her house covered in feces and urine. Patient had not been seen by neighbors in 3 days. Upon EMS arrival patient found to be tachycardic and cool to touch.  Patient appears slightly altered at bedside.  Patient states she is unsure if she has been taking her medications.  No treatment prior to arrival. Patient denies any pain.   Spoke to patient's brother, Jenny Reichmann on the phone who states he has not spoken to patient in 5-7 days. Cousin went to go check on her today and she appeared altered and unable to perform ADLs, so EMS was called. John notes that neighbors have noticed her mail piling up on her front porch and no one has been able to reach her for almost a week. Per Jenny Reichmann, patient told EMS she was feeling unwell and was having breathing difficulties. Patient told Jenny Reichmann that she typically gets pneumonia this time of year and was complaining about a cough a few days ago. Jenny Reichmann is also concerned about drug and alcohol abuse. He also states some concern about her psychiatric illness.   Per chart review, patient had a fall on 10/17  where she was evaluated in the ED on 10/19 with negative CT scans.   Level 5 caveat secondary to altered mental status.  History obtained from patient and past medical records. No interpreter used during encounter.       Past Medical History:  Diagnosis Date   Anemia    low iron   Anxiety    Asthma    Bipolar disorder (HCC)    Chronic back pain    Compression fracture of L2 lumbar vertebra (HCC)    DDD  (degenerative disc disease)    Depression    Dyspnea    GERD (gastroesophageal reflux disease)    H/O alcohol abuse    28 years sober   H/O suicide attempt    Herpes    History of hiatal hernia    Hypothyroidism    Migraines    Osteoporosis 11/2018   T score of -2.6   Pneumonia    PONV (postoperative nausea and vomiting)    S/P adenoidectomy    S/P tonsillectomy     Patient Active Problem List   Diagnosis Date Noted   Encephalopathy 03/09/2020   Pressure injury of skin 16/96/7893   Acute metabolic encephalopathy 81/03/7508   Displaced intertrochanteric fracture of left femur, initial encounter for closed fracture ([place]) 02/23/2020   Hypokalemia 02/23/2020   Intractable nausea and vomiting 08/31/2019   Epigastric pain 08/31/2019   Alcohol abuse 08/31/2019   Microcytic anemia 08/31/2019   Closed compression fracture of L2 vertebra ([place]) 12/11/2017   Pain 07/26/2015   Anxiety state 06/28/2015   Benzodiazepine dependence ([place]) 10/13/2014   Opioid abuse with opioid-induced mood disorder ([place]) 10/13/2014   Bipolar 1 disorder, depressed ([place]) 10/12/2014   Spondylolisthesis of lumbar region 07/28/2014   Osteoporosis 11/17/2013   Migraines    Herpes    Hypothyroidism 08/13/2008   HYPERCHOLESTEROLEMIA 08/13/2008   Bipolar disorder ([place])  08/13/2008   Asthma-chronic obstructive pulmonary disease overlap syndrome ([place]) 08/13/2008   GERD (gastroesophageal reflux disease) 08/13/2008   HIATAL HERNIA 08/13/2008   ALLERGY 08/13/2008    Past Surgical History:  Procedure Laterality Date   CESAREAN SECTION     COLONOSCOPY     as a teenager   Compression fracture spine     DILATION AND CURETTAGE OF UTERUS     EYE SURGERY Bilateral    CATARACTS REMOVED, NO LENS PLACED   HYSTEROSCOPY     KYPHOPLASTY N/A 12/19/2016   Procedure: THORACIC 12 KYPHOPLASTY; REQUEST 60 MINS AND FLIP ROOM;  Surgeon: Phylliss Bob, MD;  Location: [facility];  Service: Orthopedics;  Laterality: N/A;  THORACIC 12  KYPHOPLASTY; REQUEST 60 MINS AND FLIP ROOM   KYPHOPLASTY N/A 12/11/2017   Procedure: LUMBAR 2 KYPHOPLASTY;  Surgeon: Phylliss Bob, MD;  Location: [facility];  Service: Orthopedics;  Laterality: N/A;   LUMBAR FUSION     TONSILLECTOMY     TOTAL HIP ARTHROPLASTY Left 02/24/2020   Procedure: TOTAL HIP ARTHROPLASTY ANTERIOR APPROACH;  Surgeon: Rod Can, MD;  Location: [facility];  Service: Orthopedics;  Laterality: Left;     OB History     Gravida  3   Para  1   Term  1   Preterm      AB  2   Living  1      SAB      IAB      Ectopic      Multiple      Live Births              Family History  Adopted: Yes    Social History   Tobacco Use   Smoking status: Never   Smokeless tobacco: Never  Vaping Use   Vaping Use: Never used  Substance Use Topics   Alcohol use: Yes    Alcohol/week: 0.0 standard drinks   Drug use: Not Currently    Types: Benzodiazepines, Cocaine    Home Medications Prior to Admission medications   Medication Sig Start Date End Date Taking? Authorizing Provider  albuterol (PROVENTIL HFA;VENTOLIN HFA) 108 (90 Base) MCG/ACT inhaler Inhale 2 puffs into the lungs every 6 (six) hours as needed for wheezing or shortness of breath.    [provider]  budesonide (PULMICORT) 0.25 MG/2ML nebulizer solution Take 2 mLs (0.25 mg total) by nebulization 2 (two) times daily. 03/02/20   Lorella Nimrod, MD  buPROPion (WELLBUTRIN XL) 150 MG 24 hr tablet Take 300 mg by mouth daily.  10/01/16   [provider]  calcium citrate-vitamin D (CALCIUM + D) 315-200 MG-UNIT per tablet Take 1 tablet by mouth daily. For bone health Patient taking differently: Take 1 tablet by mouth daily. 10/18/14   Lindell Spar I, NP  cyclobenzaprine (FLEXERIL) 10 MG tablet Take 10 mg by mouth every 8 (eight) hours as needed for muscle spasms.    [provider]  denosumab (PROLIA) 60 MG/ML SOLN injection Inject 60 mg into the skin every 6 (six) months. Administer in  upper arm, thigh, or abdomen  NEXT IS DUE 03-2017 Patient not taking: Reported on 03/09/2020 03/05/17   [name], NP  dicyclomine (BENTYL) 20 MG tablet Take 1 tablet (20 mg total) by mouth every 8 (eight) hours as needed for spasms. 12/31/19   Petrucelli, Samantha R, PA-C  docusate sodium (COLACE) 100 MG capsule Take 1 capsule (100 mg total) by mouth 2 (two) times daily. Patient taking differently: Take 100  mg by mouth daily. 03/02/20   Lorella Nimrod, MD  enoxaparin (LOVENOX) 40 MG/0.4ML injection Inject 0.4 mLs (40 mg total) into the skin daily. 02/27/20 03/28/20  Cherlynn June B, PA  EPINEPHrine 0.3 mg/0.3 mL IJ SOAJ injection Inject 0.3 mg into the skin daily as needed for anaphylaxis.  04/10/19   [provider]  famotidine (PEPCID) 20 MG tablet Take 1 tablet (20 mg total) by mouth at bedtime. 09/03/19 02/23/20  Amin, Jeanella Flattery, MD  feeding supplement (ENSURE ENLIVE / ENSURE PLUS) LIQD Take 237 mLs by mouth daily. 03/03/20   Lorella Nimrod, MD  Ferrous Fumarate-Folic Acid 856-3 MG TABS Take 1 tablet by mouth 2 (two) times daily after a meal.    [provider]  FLUoxetine (PROZAC) 20 MG capsule Take 20 mg by mouth 2 (two) times daily.     [provider]  levothyroxine (SYNTHROID, LEVOTHROID) 112 MCG tablet Take 112 mcg by mouth daily before breakfast.  09/04/17   [provider]  magnesium oxide (MAG-OX) 400 (241.3 Mg) MG tablet Take 1 tablet (400 mg total) by mouth daily. 03/03/20   Lorella Nimrod, MD  Multiple Vitamin (MULTIVITAMIN) tablet Take 1 tablet by mouth daily. For low vitamin 10/18/14   Lindell Spar I, NP  naproxen sodium (ALEVE) 220 MG tablet Take 220 mg by mouth every 12 (twelve) hours as needed (pain).    [provider]  ondansetron (ZOFRAN ODT) 4 MG disintegrating tablet Take 1 tablet (4 mg total) by mouth every 8 (eight) hours as needed for nausea or vomiting. 12/18/19   Joy, Shawn C, PA-C  oxcarbazepine (TRILEPTAL) 600 MG tablet  Take 600 mg by mouth daily.     [provider]  oxyCODONE ER 13.5 MG C12A Take 13.5 mg by mouth 2 (two) times daily. 11/22/16   [provider]  Oxycodone HCl 10 MG TABS Take 1 tablet (10 mg total) by mouth every 4 (four) hours as needed (for breakthrough pain). 02/26/20   Dorothyann Peng, PA  pantoprazole (PROTONIX) 40 MG tablet Take 1 tablet (40 mg total) by mouth 2 (two) times daily before a meal. 09/03/19   Amin, Jeanella Flattery, MD  polyethylene glycol (MIRALAX / GLYCOLAX) 17 g packet Take 17 g by mouth daily as needed for moderate constipation or severe constipation. 09/03/19   Amin, Jeanella Flattery, MD  QUEtiapine (SEROQUEL) 100 MG tablet Take 1 tablet (100 mg total) by mouth at bedtime as needed (sleep). 03/02/20   Lorella Nimrod, MD  rizatriptan (MAXALT-MLT) 10 MG disintegrating tablet Take 10 mg by mouth daily as needed for migraine.  11/08/14   [provider]  sucralfate (CARAFATE) 1 g tablet Take 1 tablet (1 g total) by mouth 4 (four) times daily -  with meals and at bedtime. 12/18/19 02/23/20  Joy, Shawn C, PA-C  thiamine 100 MG tablet Take 1 tablet (100 mg total) by mouth daily. 03/03/20   Lorella Nimrod, MD  valACYclovir (VALTREX) 500 MG tablet Take one tablet twice daily 3-5 days then daily as needed. Patient taking differently: Take by mouth See admin instructions. Take 1 tablet (500mg ) by mouth twice daily for 3-5 days then daily as needed for herpes virus infection. 05/01/17   [name], NP    Allergies    Prednisone, Lithium carbonate, and Sulfa antibiotics  Review of Systems   Review of Systems  Unable to perform ROS: Mental status change   Physical Exam Updated Vital Signs BP (!) 80/52   Pulse  80   Temp (!) 93.6 F (34.2 C) (Rectal)   Resp 19   Ht 5\' 5"  (1.651 m)   Wt 72.6 kg   SpO2 96%   BMI 26.63 kg/m   Physical Exam Vitals and nursing note reviewed.  Constitutional:      General: She is not in acute distress.    Appearance: She is  ill-appearing.  HENT:     Head: Normocephalic.     Nose:     Comments: Ecchymosis to bridge of nose. Eyes:     Pupils: Pupils are equal, round, and reactive to light.  Cardiovascular:     Rate and Rhythm: Normal rate and regular rhythm.     Pulses: Normal pulses.     Heart sounds: Normal heart sounds. No murmur heard.   No friction rub. No gallop.  Pulmonary:     Effort: Pulmonary effort is normal.     Breath sounds: Normal breath sounds.  Abdominal:     General: Abdomen is flat. There is no distension.     Palpations: Abdomen is soft.     Tenderness: There is abdominal tenderness. There is no guarding or rebound.     Comments: Suprapubic tenderness  Musculoskeletal:        General: Normal range of motion.     Cervical back: Neck supple.  Skin:    General: Skin is warm and dry.     Comments: Cool to touch  Neurological:     General: No focal deficit present.     Mental Status: She is alert.     Comments: Unable to give year, but able to give full name and location Normal speech No facial droop Moves all 4 extremities without difficulty.   Psychiatric:        Mood and Affect: Mood normal.        Behavior: Behavior normal.    ED Results / Procedures / Treatments   Labs (all labs ordered are listed, but only abnormal results are displayed) Labs Reviewed  RESP PANEL BY RT-PCR (FLU A&B, COVID) ARPGX2  CULTURE, BLOOD (ROUTINE X 2)  CULTURE, BLOOD (ROUTINE X 2)  URINE CULTURE  LACTIC ACID, PLASMA  LACTIC ACID, PLASMA  COMPREHENSIVE METABOLIC PANEL  CBC WITH DIFFERENTIAL/PLATELET  PROTIME-INR  APTT  URINALYSIS, ROUTINE W REFLEX MICROSCOPIC  TSH    EKG None  Radiology No results found.  Procedures .Critical Care Performed by: Suzy Bouchard, PA-C Authorized by: Suzy Bouchard, PA-C   Critical care provider statement:    Critical care time (minutes):  35   Critical care was necessary to treat or prevent imminent or life-threatening deterioration of  the following conditions:  Sepsis   Critical care was time spent personally by me on the following activities:  Examination of patient, pulse oximetry, ordering and review of laboratory studies, ordering and performing treatments and interventions and obtaining history from patient or surrogate   I assumed direction of critical care for this patient from another provider in my specialty: no     Medications Ordered in ED Medications  lactated ringers infusion (has no administration in time range)  lactated ringers bolus 1,000 mL (has no administration in time range)    And  lactated ringers bolus 1,000 mL (has no administration in time range)    And  lactated ringers bolus 250 mL (has no administration in time range)  ceFEPIme (MAXIPIME) 2 g in sodium chloride 0.9 % 100 mL IVPB (has no administration in time range)  metroNIDAZOLE (FLAGYL) IVPB 500 mg (has no administration in time range)  vancomycin (VANCOCIN) IVPB 1000 mg/200 mL premix (has no administration in time range)    ED Course  I have reviewed the triage vital signs and the nursing notes.  Pertinent labs & imaging results that were available during my care of the patient were reviewed by me and considered in my medical decision making (see chart for details).    MDM Rules/Calculators/A&P                          70 year old female presents to the ED as code sepsis.  Patient has not been seen or heard of in 3 days.  Upon EMS arrival, patient found covered in feces and urine.  Upon arrival, patient hypothermic at 93.6 F and hypotensive at 80/52 however, during my initial evaluation I personally rechecked blood pressure which was 107/80.  Normal oxygen saturation and heart rate. Patient appears altered at bedside and is unable to answer many questions; however able to tell me where she is and full name. Sepsis labs ordered. IVFs (30cc/kg) and IV antibiotics started. Given AMS and recent fall, CT head ordered. TSH to rule out thyroid  disease. Ethanol and UDS due to brother's concern. Discussed with Dr. Regenia Skeeter who agrees with assessment and plan.   Patient handed off to Dr. Haskell Riling at shift change pending labs and sepsis work-up.  Final Clinical Impression(s) / ED Diagnoses Final diagnoses:  None    Rx / DC Orders ED Discharge Orders     None        Karie Kirks 01/13/21 1512    Sherwood Gambler, MD 01/13/21 [phone]

## 2021-01-13 NOTE — ED Notes (Signed)
MD and PA aware that only 1 BC has been able to be collected d/t pt poor perfusion

## 2021-01-13 NOTE — Sepsis Progress Note (Signed)
ELink tracking the Code Sepsis. 

## 2021-01-13 NOTE — ED Notes (Signed)
Patient transported to CT 

## 2021-01-13 NOTE — ED Provider Notes (Signed)
Physical Exam  BP (!) 146/78 (BP Location: Right Arm)   Pulse 86   Temp 98.1 F (36.7 C)   Resp 16   Ht 5\' 5"  (1.651 m)   Wt 72.6 kg   SpO2 100%   BMI 26.63 kg/m   Physical Exam  ED Course/Procedures     Procedures  MDM  Care of patient assumed from previous provider at shift change. Please refer to their note for further history and plan.  In brief, patient is a 70 y.o. y/o female presenting with: - AMS, hypothermia, h/o drug/etoh use, cough x 3 days with h/o pna - PMHx: Past Medical History:  Diagnosis Date   Anemia    low iron   Anxiety    Asthma    Bipolar disorder (HCC)    Chronic back pain    Compression fracture of L2 lumbar vertebra (HCC)    DDD (degenerative disc disease)    Depression    Dyspnea    GERD (gastroesophageal reflux disease)    H/O alcohol abuse    28 years sober   H/O suicide attempt    Herpes    History of hiatal hernia    Hypothyroidism    Migraines    Osteoporosis 11/2018   T score of -2.6   Pneumonia    PONV (postoperative nausea and vomiting)    S/P adenoidectomy    S/P tonsillectomy     Review of ED course: - code sepsis, hypotensive and hypothermic - broad abx, difficulty obtaining labs. 30 cc/kg fluid bolus   Plan at the time of handoff is as follows: - CT head pending, labs pending - Difficulty obtaining labs   Additional MDM:  VS upon my assumption of care were sig for hypotension, hypothermia.  EKG interpretation: NSR, rate 85 bpm, normal intervals, no ST elevations or depressions, no T wave inversions, premature atrial complexes.  Labs: COVID/flu negative.  Mild hypokalemia, elevated BUN and AKI with creatinine of 3.86, increased from prior baseline of 0.78, hypercalcemia, transaminitis with elevated T bili, anion gap of 22.  Leukocytosis with left shift, hemoconcentration with thrombocytosis.  Cultures obtained.  TSH and T4 were elevated.  CK WNL.  Lactic acidosis of 4.5, repeat 3.6.  UA unremarkable.  VBG WNL.   UDS unremarkable.  Imaging: CXR unremarkable.  CT head negative for acute findings.  RUQ ultrasound notable for gallbladder sludge, otherwise unremarkable.  CT AP with sigmoid diverticulosis without diverticulitis. Imaging reviewed by radiology and personally by me.   Medications: Medications  lactated ringers infusion ( Intravenous New Bag/Given 01/13/21 1645)  vancomycin variable dose per unstable renal function (pharmacist dosing) (has no administration in time range)  ceFEPIme (MAXIPIME) 2 g in sodium chloride 0.9 % 100 mL IVPB (has no administration in time range)  enoxaparin (LOVENOX) injection 30 mg (has no administration in time range)  lactated ringers bolus 1,000 mL (0 mLs Intravenous Stopped 01/13/21 1643)    And  lactated ringers bolus 1,000 mL (0 mLs Intravenous Stopped 01/13/21 1643)    And  lactated ringers bolus 250 mL (0 mLs Intravenous Stopped 01/13/21 1801)  ceFEPIme (MAXIPIME) 2 g in sodium chloride 0.9 % 100 mL IVPB (0 g Intravenous Stopped 01/13/21 1554)  metroNIDAZOLE (FLAGYL) IVPB 500 mg (0 mg Intravenous Stopped 01/13/21 1747)  vancomycin (VANCOREADY) IVPB 1500 mg/300 mL (0 mg Intravenous Stopped 01/13/21 1854)  lactated ringers bolus 500 mL (0 mLs Intravenous Stopped 01/13/21 1833)    Patient re-evaluated to admission.  Hypothermia resolved, temp sensing  Foley in place.  Bair hugger changed out for warm blankets.  Hemodynamically stable with normotension and no tachycardia after fluid resuscitation.  Remains on room air.  Neurologic exam unchanged.  Patient remains unable to further describe what happened over the past 3 days.  Admitted to hospitalist in stable condition for undifferentiated sepsis and suspected prerenal AKI. Patient understands and agrees with the plan. Transferred from the ED without issue.  The plan for this patient was discussed with my attending physician, who voiced agreement and who oversaw evaluation and treatment of this patient.      Note: Estate manager/land agent was used in the creation of this note.   1. Right upper quadrant abdominal pain            Cherly Hensen, DO 01/14/21 0007    Luna Fuse, MD 01/18/21 [phone]

## 2021-01-13 NOTE — Sepsis Progress Note (Signed)
Notified provider of need to order additional  fluid bolus. 

## 2021-01-13 NOTE — ED Triage Notes (Signed)
Pt arrived to ED via EMS from home where pt lives alone. CC Sepsis. EMS called out for "sick person". Upon EMS arrival pt was in bed presumably for 3 days d/t neighbors saw her 3 days ago. EMS reports pt was covered in feces and urine. Pt alert but oriented to person, place and intermittently to time. Pt had transitional fall 5 days ago which left her w/ bruising to the bridge of nose. Pt was seen for the fall. EMS VS: 142/70, HR 100-110, RR 25, ETCO2 20, 98% RA, cool to touch per EMS

## 2021-01-13 NOTE — ED Notes (Signed)
Admitting MD at bedside and updated about passing swallow screen. Diet order changed and pt given coke per request. No acute changes noted. Will continue to monitor.

## 2021-01-14 DIAGNOSIS — E872 Acidosis, unspecified: Secondary | ICD-10-CM

## 2021-01-14 DIAGNOSIS — E860 Dehydration: Secondary | ICD-10-CM

## 2021-01-14 DIAGNOSIS — D75839 Thrombocytosis, unspecified: Secondary | ICD-10-CM

## 2021-01-14 DIAGNOSIS — G9341 Metabolic encephalopathy: Secondary | ICD-10-CM

## 2021-01-14 DIAGNOSIS — N179 Acute kidney failure, unspecified: Secondary | ICD-10-CM | POA: Diagnosis not present

## 2021-01-14 DIAGNOSIS — A419 Sepsis, unspecified organism: Secondary | ICD-10-CM

## 2021-01-14 DIAGNOSIS — D72829 Elevated white blood cell count, unspecified: Secondary | ICD-10-CM

## 2021-01-14 DIAGNOSIS — R7401 Elevation of levels of liver transaminase levels: Secondary | ICD-10-CM

## 2021-01-14 DIAGNOSIS — E876 Hypokalemia: Secondary | ICD-10-CM

## 2021-01-14 LAB — BASIC METABOLIC PANEL
Anion gap: 17 — ABNORMAL HIGH (ref 5–15)
BUN: 135 mg/dL — ABNORMAL HIGH (ref 8–23)
CO2: 18 mmol/L — ABNORMAL LOW (ref 22–32)
Calcium: 9.6 mg/dL (ref 8.9–10.3)
Chloride: 110 mmol/L (ref 98–111)
Creatinine, Ser: 3.14 mg/dL — ABNORMAL HIGH (ref 0.44–1.00)
GFR, Estimated: 15 mL/min — ABNORMAL LOW (ref >60)
Glucose, Bld: 207 mg/dL — ABNORMAL HIGH (ref 70–99)
Potassium: 3.1 mmol/L — ABNORMAL LOW (ref 3.5–5.1)
Sodium: 145 mmol/L (ref 135–145)

## 2021-01-14 LAB — URINE CULTURE: Culture: NO GROWTH

## 2021-01-14 LAB — HEMOGLOBIN A1C
Hgb A1c MFr Bld: 5.6 % (ref 4.8–5.6)
Mean Plasma Glucose: 114.02 mg/dL

## 2021-01-14 LAB — GLUCOSE, CAPILLARY
Glucose-Capillary: 207 mg/dL — ABNORMAL HIGH (ref 70–99)
Glucose-Capillary: 189 mg/dL — ABNORMAL HIGH (ref 70–99)
Glucose-Capillary: 151 mg/dL — ABNORMAL HIGH (ref 70–99)
Glucose-Capillary: 132 mg/dL — ABNORMAL HIGH (ref 70–99)

## 2021-01-14 LAB — CBC
HCT: 44.7 % (ref 36.0–46.0)
Hemoglobin: 14.7 g/dL (ref 12.0–15.0)
MCH: 31.1 pg (ref 26.0–34.0)
MCHC: 32.9 g/dL (ref 30.0–36.0)
MCV: 94.7 fL (ref 80.0–100.0)
Platelets: 285 10*3/uL (ref 150–400)
RBC: 4.72 MIL/uL (ref 3.87–5.11)
RDW: 14.8 % (ref 11.5–15.5)
WBC: 15.2 10*3/uL — ABNORMAL HIGH (ref 4.0–10.5)
nRBC: 0 % (ref 0.0–0.2)

## 2021-01-14 LAB — LACTIC ACID, PLASMA: Lactic Acid, Venous: 3.1 mmol/L (ref 0.5–1.9)

## 2021-01-14 LAB — PROTIME-INR
INR: 1.3 — ABNORMAL HIGH (ref 0.8–1.2)
Prothrombin Time: 15.9 seconds — ABNORMAL HIGH (ref 11.4–15.2)

## 2021-01-14 LAB — APTT: aPTT: 27 seconds (ref 24–36)

## 2021-01-14 LAB — MAGNESIUM: Magnesium: 2.4 mg/dL (ref 1.7–2.4)

## 2021-01-14 LAB — PHOSPHORUS: Phosphorus: 6.2 mg/dL — ABNORMAL HIGH (ref 2.5–4.6)

## 2021-01-14 LAB — PROCALCITONIN: Procalcitonin: 0.2 ng/mL

## 2021-01-14 MED ORDER — SENNOSIDES-DOCUSATE SODIUM 8.6-50 MG PO TABS: 1.0000 | ORAL_TABLET | Freq: Every evening | ORAL | Status: DC | PRN

## 2021-01-14 MED ORDER — POTASSIUM CHLORIDE CRYS ER 20 MEQ PO TBCR
20.0000 meq | EXTENDED_RELEASE_TABLET | Freq: Once | ORAL | Status: AC
  Administered 2021-01-14: 20 meq via ORAL
  Filled 2021-01-14: qty 1

## 2021-01-14 MED ORDER — ONDANSETRON HCL 4 MG/2ML IJ SOLN
4.0000 mg | Freq: Four times a day (QID) | INTRAMUSCULAR | Status: DC | PRN
  Administered 2021-01-14 – 2021-01-16 (×3): 4 mg via INTRAVENOUS
  Filled 2021-01-14 (×3): qty 2

## 2021-01-14 MED ORDER — GUAIFENESIN 100 MG/5ML PO LIQD: 5.0000 mL | ORAL | Status: DC | PRN

## 2021-01-14 MED ORDER — ACETAMINOPHEN 325 MG PO TABS: 650.0000 mg | ORAL_TABLET | Freq: Four times a day (QID) | ORAL | Status: DC | PRN

## 2021-01-14 MED ORDER — METOPROLOL TARTRATE 5 MG/5ML IV SOLN: 5.0000 mg | INTRAVENOUS | Status: DC | PRN

## 2021-01-14 MED ORDER — LACTATED RINGERS IV SOLN: INTRAVENOUS | Status: DC

## 2021-01-14 MED ORDER — CHLORHEXIDINE GLUCONATE CLOTH 2 % EX PADS
6.0000 | MEDICATED_PAD | Freq: Every day | CUTANEOUS | Status: DC
  Administered 2021-01-14 – 2021-01-17 (×4): 6 via TOPICAL

## 2021-01-14 MED ORDER — HYDRALAZINE HCL 20 MG/ML IJ SOLN: 10.0000 mg | INTRAMUSCULAR | Status: DC | PRN

## 2021-01-14 MED ORDER — SODIUM CHLORIDE 0.9 % IV SOLN: INTRAVENOUS | Status: DC

## 2021-01-14 MED ORDER — ENSURE ENLIVE PO LIQD
237.0000 mL | Freq: Three times a day (TID) | ORAL | Status: DC
  Administered 2021-01-14 – 2021-01-15 (×4): 237 mL via ORAL

## 2021-01-14 NOTE — Progress Notes (Signed)
Initial Nutrition Assessment  DOCUMENTATION CODES:   Not applicable  INTERVENTION:  Provide Ensure Enlive po TID, each supplement provides 350 kcal and 20 grams of protein.  Encourage adequate PO intake.   NUTRITION DIAGNOSIS:   Increased nutrient needs related to acute illness as evidenced by estimated needs.  GOAL:   Patient will meet greater than or equal to 90% of their needs  MONITOR:   PO intake, Supplement acceptance, Skin, Weight trends, Labs, I & O's  REASON FOR ASSESSMENT:   Malnutrition Screening Tool    ASSESSMENT:   70 year old with history of GERD, asthma, hypothyroidism, alcohol use, bipolar, osteoporosis, anxiety presents with complaints of weakness and confusion. Noted to be dehydrated with evidence of mild rhabdomyolysis and acute kidney injury  Unable to obtain pt nutrition history at this time. Meal completion 5-25%. RD to order nutritional supplements to aid in caloric and protein needs. Unable to complete Nutrition-Focused physical exam at this time.   Labs and medications reviewed.   Diet Order:   Diet Order             Diet Heart Room service appropriate? Yes; Fluid consistency: Thin  Diet effective now                   EDUCATION NEEDS:   Not appropriate for education at this time  Skin:  Skin Assessment: Reviewed RN Assessment  Last BM:  Unknown  Height:   Ht Readings from Last 1 Encounters:  01/13/21 5\' 5"  (1.651 m)    Weight:   Wt Readings from Last 1 Encounters:  01/13/21 68.9 kg   BMI:  Body mass index is 25.28 kg/m.  Estimated Nutritional Needs:   Kcal:  1700-1900  Protein:  80-90 grams  Fluid:  >/= 1.7 L/day  Corrin Parker, MS, RD, LDN RD pager number/after hours weekend pager number on [facility].

## 2021-01-14 NOTE — Progress Notes (Signed)
PROGRESS NOTE    Victoria Levine  OMV:[id number] DOB: [birth year] DOA: 01/13/2021 PCP: [name], MD   Brief Narrative:  70 year old with history of GERD, asthma, hypothyroidism, alcohol use, bipolar, osteoporosis, anxiety comes to the hospital with complaints of weakness and confusion.  Apparently patient was not seen by her neighbor for several days therefore cousin went to check up on her and was noted to be confused therefore EMS was called and she was brought to the hospital.  Upon arrival she was noted to be dehydrated with evidence of mild rhabdomyolysis and acute kidney injury.  Chest x-ray, CT abdomen pelvis was unremarkable.  Right upper quadrant showed some hepatic steatosis.  CT head was negative.  She was started on broad-spectrum antibiotics.   Assessment & Plan:   Principal Problem:   Acute kidney injury ([facility]) Active Problems:   Hypokalemia   Acute metabolic encephalopathy   Lactic acidosis   Sepsis (H[facility])   Hypercalcemia   Leukocytosis   Thrombocytosis   Dehydration   Transaminitis   Acute kidney injury - Suspect prerenal from dehydration.  Baseline creatinine 0.8.  Admission creatinine 3.86 - Periodically monitor BMP, if creatinine not improving will get bladder scan, renal ultrasound and further work-up as appropriate.   SIRS Leukocytosis, improving -no obvious evidence of infection at this time.  Procalcitonin 0.2, UA is unremarkable, chest x-ray is negative.  Follow culture data.  Will discontinue antibiotics and monitor clinically  Hypokalemia - Repletion as necessary   Lactic acidosis  -Likely from dehydration.  Change lactated Ringer to normal saline.  Acute metabolic encephalopathy -improving.  CT head is negative-likely from dehydration.  Continue to monitor.  Hypercalcemia Calcium 11.4, continue IV hydration  Transaminitis possibly due to shock liver/biliary sludge AST 148, ALT 374, ALP 136 -Trend LFTs for now.  Right upper quadrant  ultrasound shows some hepatic steatosis  Thrombocytosis possibly reactive -From dehydration.  Resolved    Dehydration Continue IV hydration   Increased anion gap/hyperglycemia -Likely from dehydration, continue to monitor   ??  Hyperthyroidism TSH was 15.713, T4 > 5.5 No clinical signs of hyperthyroidism Consider repeatin-elevated TSH and T4.  Repeat labs outpatient g this in a nonacute state    DVT prophylaxis: enoxaparin (LOVENOX) injection 30 mg Start: 01/13/21 2330 SCDs Start: 01/13/21 2321 Code Status: Patient confirms that she is a DNR Family Communication:    Status is: Inpatient  Remains inpatient appropriate because: Ongoing evaluation for acute kidney injury getting IV fluids.  Needs PT/OT also for safe disposition planning upon discharge       Nutritional status           Body mass index is 25.28 kg/m.  Pressure Injury 03/09/20 Sacrum Posterior Stage 1 -  Intact skin with non-blanchable redness of a localized area usually over a bony prominence. dressing from nursing home in place on the sacrum, but to the right stage 1 noted.  (Active)  03/09/20 1600  Location: Sacrum  Location Orientation: Posterior  Staging: Stage 1 -  Intact skin with non-blanchable redness of a localized area usually over a bony prominence.  Wound Description (Comments): dressing from nursing home in place on the sacrum, but to the right stage 1 noted.   Present on Admission: Yes          Subjective: Feeling better but still overall tired.   Review of Systems Otherwise negative except as per HPI, including: General = no fevers, chills, dizziness,  fatigue HEENT/EYES = negative for loss of vision, double  vision, blurred vision,  sore throa Cardiovascular= negative for chest pain, palpitation Respiratory/lungs= negative for shortness of breath, cough, wheezing; hemoptysis,  Gastrointestinal= negative for nausea, vomiting, abdominal pain Genitourinary= negative for  Dysuria MSK = Negative for arthralgia, myalgias Neurology= Negative for headache, numbness, tingling  Psychiatry= Negative for suicidal and homocidal ideation Skin= Negative for Rash   Examination:  Constitutional: Not in acute distress Respiratory: Clear to auscultation bilaterally Cardiovascular: Normal sinus rhythm, no rubs Abdomen: Nontender nondistended good bowel sounds Musculoskeletal: No edema noted Skin: Ecchymosis/bruising around her left eye orbit Neurologic: CN 2-12 grossly intact.  And nonfocal Psychiatric: Normal judgment and insight. Alert and oriented x 3. Normal mood.     Objective: Vitals:   01/13/21 2353 01/14/21 0053 01/14/21 0453 01/14/21 0938  BP: (!) 146/78  125/73 114/65  Pulse: 86  66 80  Resp: 16  20 18   Temp: (!) 97.4 F (36.3 C) 98 F (36.7 C) (!) 97.5 F (36.4 C) 98.6 F (37 C)  TempSrc: Axillary Axillary Axillary   SpO2: 100%  99% 98%  Weight:      Height:        Intake/Output Summary (Last 24 hours) at 01/14/2021 1005 Last data filed at 01/14/2021 0600 Gross per 24 hour  Intake 4846.3 ml  Output 150 ml  Net 4696.3 ml   Filed Weights   01/13/21 1420 01/13/21 2230  Weight: 72.6 kg 68.9 kg     Data Reviewed:   CBC: Recent Labs  Lab 01/13/21 1418 01/13/21 1702 01/14/21 0722  WBC 21.6*  --  15.2*  NEUTROABS 18.9*  --   --   HGB 19.3* 16.7* 14.7  HCT 58.7* 49.0* 44.7  MCV 93.8  --  94.7  PLT 438*  --  614   Basic Metabolic Panel: Recent Labs  Lab 01/13/21 1418 01/13/21 1702 01/14/21 0722  NA 144 148*  --   K 3.4* 3.3*  --   CL 105  --   --   CO2 17*  --   --   GLUCOSE 221*  --   --   BUN 169*  --   --   CREATININE 3.86*  --   --   CALCIUM 11.4*  --   --   MG  --   --  2.4  PHOS  --   --  6.2*   GFR: Estimated Creatinine Clearance: 13 mL/min (A) (by C-G formula based on SCr of 3.86 mg/dL (H)). Liver Function Tests: Recent Labs  Lab 01/13/21 1418  AST 148*  ALT 374*  ALKPHOS 136*  BILITOT 2.5*  PROT 7.9   ALBUMIN 4.1   No results for input(s): LIPASE, AMYLASE in the last 168 hours. No results for input(s): AMMONIA in the last 168 hours. Coagulation Profile: Recent Labs  Lab 01/13/21 1418 01/13/21 1630 01/14/21 0722  INR 1.2 1.2 1.3*   Cardiac Enzymes: Recent Labs  Lab 01/13/21 1424  CKTOTAL 29*   BNP (last 3 results) No results for input(s): PROBNP in the last 8760 hours. HbA1C: Recent Labs    01/14/21 0722  HGBA1C 5.6   CBG: Recent Labs  Lab 01/14/21 0609  GLUCAP 207*   Lipid Profile: No results for input(s): CHOL, HDL, LDLCALC, TRIG, CHOLHDL, LDLDIRECT in the last 72 hours. Thyroid Function Tests: Recent Labs    01/13/21 1424  TSH 15.713*  FREET4 >5.50*   Anemia Panel: No results for input(s): VITAMINB12, FOLATE, FERRITIN, TIBC, IRON, RETICCTPCT in the last 72 hours. Sepsis Labs: Recent Labs  Lab 01/13/21 1525 01/13/21 1715 01/14/21 0722  PROCALCITON  --   --  0.20  LATICACIDVEN 4.5* 3.6* 3.1*    Recent Results (from the past 240 hour(s))  Resp Panel by RT-PCR (Flu A&B, Covid) Nasopharyngeal Swab     Status: None   Collection Time: 01/13/21  2:18 PM   Specimen: Nasopharyngeal Swab; Nasopharyngeal(NP) swabs in vial transport medium  Result Value Ref Range Status   SARS Coronavirus 2 by RT PCR NEGATIVE NEGATIVE Final    Comment: (NOTE) SARS-CoV-2 target nucleic acids are NOT DETECTED.  The SARS-CoV-2 RNA is generally detectable in upper respiratory specimens during the acute phase of infection. The lowest concentration of SARS-CoV-2 viral copies this assay can detect is 138 copies/mL. A negative result does not preclude SARS-Cov-2 infection and should not be used as the sole basis for treatment or other patient management decisions. A negative result may occur with  improper specimen collection/handling, submission of specimen other than nasopharyngeal swab, presence of viral mutation(s) within the areas targeted by this assay, and inadequate  number of viral copies(<138 copies/mL). A negative result must be combined with clinical observations, patient history, and epidemiological information. The expected result is Negative.  Fact Sheet for Patients:  EntrepreneurPulse.com.au  Fact Sheet for Healthcare Providers:  IncredibleEmployment.be  This test is no t yet approved or cleared by the [place] FDA and  has been authorized for detection and/or diagnosis of SARS-CoV-2 by FDA under an Emergency Use Authorization (EUA). This EUA will remain  in effect (meaning this test can be used) for the duration of the COVID-19 declaration under Section 564(b)(1) of the Act, 21 U.S.C.section 360bbb-3(b)(1), unless the authorization is terminated  or revoked sooner.       Influenza A by PCR NEGATIVE NEGATIVE Final   Influenza B by PCR NEGATIVE NEGATIVE Final    Comment: (NOTE) The Xpert Xpress SARS-CoV-2/FLU/RSV plus assay is intended as an aid in the diagnosis of influenza from Nasopharyngeal swab specimens and should not be used as a sole basis for treatment. Nasal washings and aspirates are unacceptable for Xpert Xpress SARS-CoV-2/FLU/RSV testing.  Fact Sheet for Patients: EntrepreneurPulse.com.au  Fact Sheet for Healthcare Providers: IncredibleEmployment.be  This test is not yet approved or cleared by the [place] FDA and has been authorized for detection and/or diagnosis of SARS-CoV-2 by FDA under an Emergency Use Authorization (EUA). This EUA will remain in effect (meaning this test can be used) for the duration of the COVID-19 declaration under Section 564(b)(1) of the Act, 21 U.S.C. section 360bbb-3(b)(1), unless the authorization is terminated or revoked.  Performed at [facility], [place] [address], [place], [place] [address]   Blood Culture (routine x 2)     Status: None (Preliminary result)   Collection Time: 01/13/21  2:50 PM    Specimen: BLOOD  Result Value Ref Range Status   Specimen Description BLOOD LEFT ANTECUBITAL  Final   Special Requests   Final    BOTTLES DRAWN AEROBIC AND ANAEROBIC Blood Culture adequate volume   Culture   Final    NO GROWTH < 24 HOURS Performed at [facility], [place] [address], [place], [place] [address]    Report Status PENDING  Incomplete         Radiology Studies: CT ABDOMEN PELVIS WO CONTRAST  Result Date: 01/13/2021 CLINICAL DATA:  Sepsis EXAM: CT ABDOMEN AND PELVIS WITHOUT CONTRAST TECHNIQUE: Multidetector CT imaging of the abdomen and pelvis was performed following the standard protocol without IV  contrast. COMPARISON:  Virtual colonoscopy dated 11/17/2019 FINDINGS: Lower chest: Although not ordered, almost the entire chest is within the field of view. No evidence of pneumonia. Mild compressive atelectasis in the medial right middle lobe. No pleural effusion or pneumothorax. Large fat containing anterior hernia along the right anterior aspect of the heart. The heart is normal in size. No pericardial effusion. Mild atherosclerotic calcifications of the arch. Hepatobiliary: Unenhanced liver is unremarkable. Gallbladder is unremarkable. No intrahepatic or extrahepatic ductal dilatation. Pancreas: Within normal limits. Spleen: Within normal limits. Adrenals/Urinary Tract: Adrenal glands are within normal limits. Kidneys are within normal limits. No renal calculi or hydronephrosis. Bladder decompressed by an indwelling Foley catheter with trace gas. Stomach/Bowel: Stomach is notable for a moderate hiatal hernia/intrathoracic stomach. No evidence of bowel obstruction. Normal appendix (series 8/image 4). Mild sigmoid diverticulosis, without evidence of diverticulitis. Vascular/Lymphatic: No evidence of abdominal aortic aneurysm. Atherosclerotic calcifications of the abdominal aorta and branch vessels. No suspicious abdominopelvic lymphadenopathy. Reproductive: Uterus is within normal  limits. Bilateral ovaries are unremarkable. Other: No abdominopelvic ascites. Musculoskeletal: Severe compression fracture deformity at T3 with mild retropulsion, age indeterminate. Mild superior endplate compression fracture deformity at T8, likely chronic. Prior vertebral augmentation at T12 and L2. Status post PLIF at L3-5. Degenerative changes of the lumbar spine. Left hip arthroplasty. IMPRESSION: No evidence of bowel obstruction. Normal appendix. Mild sigmoid diverticulosis, without evidence of diverticulitis. Bladder decompressed by an indwelling Foley catheter. No evidence of pneumonia. Moderate hiatal hernia.  Large anterior hernia with fat. Severe compression fracture deformity at T3 with mild retropulsion, age indeterminate. Electronically Signed   By: Julian Hy M.D.   On: 01/13/2021 20:07   CT Head Wo Contrast  Result Date: 01/13/2021 CLINICAL DATA:  Head trauma EXAM: CT HEAD WITHOUT CONTRAST TECHNIQUE: Contiguous axial images were obtained from the base of the skull through the vertex without intravenous contrast. COMPARISON:  CT brain 01/04/2021, 03/02/2020, 02/23/2020 FINDINGS: Brain: No acute territorial infarction, hemorrhage or intracranial mass is visualized. Atrophy and mild chronic small vessel ischemic changes of the white matter. Small chronic infarcts in the cerebellum. Stable left posterior parafalcine round calcification likely calcified meningioma. Stable ventricle size. Vascular: No hyperdense vessels.  Carotid vascular calcification Skull: No fracture. Stable 11 mm intra osseous ground-glass lesion within the left frontal bone, felt benign. Sinuses/Orbits: No acute finding. Other: None IMPRESSION: 1. No CT evidence for acute intracranial abnormality. 2. Atrophy and mild chronic small vessel ischemic changes of the white matter Electronically Signed   By: Donavan Foil M.D.   On: 01/13/2021 16:12   DG Chest Port 1 View  Result Date: 01/13/2021 CLINICAL DATA:  Sepsis  EXAM: PORTABLE CHEST 1 VIEW COMPARISON:  03/09/2020 FINDINGS: Single frontal view of the chest demonstrates a stable cardiac silhouette. Stable hiatal hernia and prominent epicardial fat. No acute airspace disease, effusion, or pneumothorax. No acute bony abnormalities. IMPRESSION: 1. Stable chest, no acute intrathoracic process. Electronically Signed   By: Randa Ngo M.D.   On: 01/13/2021 16:00   US Abdomen Limited RUQ (LIVER/GB)  Result Date: 01/13/2021 CLINICAL DATA:  Right upper quadrant abdominal pain. EXAM: ULTRASOUND ABDOMEN LIMITED RIGHT UPPER QUADRANT COMPARISON:  Abdominal ultrasound and CT 08/31/2019 FINDINGS: Gallbladder: Physiologically distended. Layering intraluminal sludge. Gallstones on prior ultrasound are not well seen on the current exam. No gallbladder wall thickening. No sonographic Murphy sign noted by sonographer. Common bile duct: Diameter: 6 mm. Liver: Heterogeneous increased parenchymal echogenicity. The liver is difficult to penetrate. Allowing for this, no focal lesion.  Technically limited assessment due to altered mental status and difficulty with breath hold. Portal vein is patent on color Doppler imaging with normal direction of blood flow towards the liver. Other: No right upper quadrant ascites. IMPRESSION: 1. Gallbladder sludge. Gallstones on prior ultrasound are not well seen on the current exam. No sonographic findings of acute cholecystitis. 2. No biliary dilatation. 3. Suggestion of hepatic steatosis. Electronically Signed   By: Keith Rake M.D.   On: 01/13/2021 18:10        Scheduled Meds:  Chlorhexidine Gluconate Cloth  6 each Topical Daily   enoxaparin (LOVENOX) injection  30 mg Subcutaneous QHS   vancomycin variable dose per unstable renal function (pharmacist dosing)   Does not apply See admin instructions   Continuous Infusions:  sodium chloride 75 mL/hr at 01/14/21 0841   ceFEPime (MAXIPIME) IV       LOS: 1 day   Time spent= 35  mins    Endrit Gittins Arsenio Loader, MD [facility]  If 7PM-7AM, please contact night-coverage  01/14/2021, 10:05 AM

## 2021-01-14 NOTE — Progress Notes (Signed)
New Admission Note:    Arrival Method: Stretcher from ED Mental Orientation:   A&O X3 Telemetry: NSR Assessment: see flowsheet IV:   right wrist, left upper arm Pain:  No c/o pain on admission  Tubes: none  Safety Measures: Safety Fall Prevention Plan has been given,  Admission: Completed [facility] Orientation: Patient has been orientated to the room, unit and staff.  Family:         Orders have been reviewed and implemented. Will continue to monitor the patient. Call light has been placed within reach and bed alarm has been activated.

## 2021-01-14 NOTE — H&P (Signed)
History and Physical  Victoria Levine EHU:[id number] DOB: 1950-12-14 DOA: 01/13/2021  Referring physician: Cherly Hensen, DO PCP: [name], MD  Patient coming from: Home  Chief Complaint: Code sepsis  HPI: Victoria Levine is a 70 y.o. female with medical history significant for GERD, asthma, hypothyroidism, alcohol abuse, bipolar disorder, osteoporosis, anxiety disorder who presents to the emergency department due to presumed sepsis.  Patient states that she lives alone and that she did not remember what happened and that she only knew that she was brought to the ED for further evaluation.  History was obtained from ED physician and ED medical record.  Per report, patient's neighbors did not see her in 3 days,  a cousin went to check on her today and she was noted to have altered mental status and was unable to perform ADLs (change from baseline), so EMS was activated, on arrival of EMS team, patient was found covered in feces and urine, mental status was slightly altered. Patient complained to brother of usually having pneumonia around this time of year, however, patient's brother Jenny Reichmann) was concerned of drug and alcohol abuse as well as some psychiatric illness per ED medical record.  ED Course:  In the emergency department, patient was 96.73F on arrival to the ED, respiration 25/min, pulse 87 bpm, BP 152/99, O2 sat 99% on room air.  Work-up in the ED showed leukocytosis, thrombocytosis and elevated H/H19.3/58.7.  Potassium 3.4, BUN/creatinine 169/3.86 (baseline creatinine at 0.5-0.8), anion gap at 22, transaminitis, TSH 15.713, calcium 11.4, CBG 221.  Influenza A, B, SARS coronavirus 2 was negative.  Chest x-ray showed stable chest with no acute intrathoracic process CT abdomen and pelvis without contrast showed no evidence of bowel obstruction. Right upper quadrant ultrasound showed gallbladder sludge and a suggestion of hepatic steatosis CT of head without contrast showed no  acute intracranial abnormality Bair hugger was provided with improvement in body temperature.  She was empirically treated with IV cefepime, metronidazole and vancomycin, IV hydration per sepsis protocol was provided.  Review of Systems: Constitutional: Negative for chills and fever.  HENT: Negative for ear pain and sore throat.   Eyes: Negative for pain and visual disturbance.  Respiratory: Negative for cough, chest tightness and shortness of breath.   Cardiovascular: Negative for chest pain and palpitations.  Gastrointestinal: Negative for abdominal pain and vomiting.  Endocrine: Negative for polyphagia and polyuria.  Genitourinary: Negative for decreased urine volume, dysuria, enuresis Musculoskeletal: Negative for arthralgias and back pain.  Skin: Negative for color change and rash.  Allergic/Immunologic: Negative for immunocompromised state.  Neurological: Negative for tremors, syncope, speech difficulty, weakness, light-headedness and headaches.  Hematological: Does not bruise/bleed easily.  All other systems reviewed and are negative   Past Medical History:  Diagnosis Date   Anemia    low iron   Anxiety    Asthma    Bipolar disorder (HCC)    Chronic back pain    Compression fracture of L2 lumbar vertebra (HCC)    DDD (degenerative disc disease)    Depression    Dyspnea    GERD (gastroesophageal reflux disease)    H/O alcohol abuse    28 years sober   H/O suicide attempt    Herpes    History of hiatal hernia    Hypothyroidism    Migraines    Osteoporosis 11/2018   T score of -2.6   Pneumonia    PONV (postoperative nausea and vomiting)    S/P adenoidectomy    S/P tonsillectomy  Past Surgical History:  Procedure Laterality Date   CESAREAN SECTION     COLONOSCOPY     as a teenager   Compression fracture spine     DILATION AND CURETTAGE OF UTERUS     EYE SURGERY Bilateral    CATARACTS REMOVED, NO LENS PLACED   HYSTEROSCOPY     KYPHOPLASTY N/A 12/19/2016    Procedure: THORACIC 12 KYPHOPLASTY; REQUEST 60 MINS AND FLIP ROOM;  Surgeon: Phylliss Bob, MD;  Location: [facility];  Service: Orthopedics;  Laterality: N/A;  THORACIC 12 KYPHOPLASTY; REQUEST 60 MINS AND FLIP ROOM   KYPHOPLASTY N/A 12/11/2017   Procedure: LUMBAR 2 KYPHOPLASTY;  Surgeon: Phylliss Bob, MD;  Location: [facility];  Service: Orthopedics;  Laterality: N/A;   LUMBAR FUSION     TONSILLECTOMY     TOTAL HIP ARTHROPLASTY Left 02/24/2020   Procedure: TOTAL HIP ARTHROPLASTY ANTERIOR APPROACH;  Surgeon: Rod Can, MD;  Location: [facility];  Service: Orthopedics;  Laterality: Left;    Social History:  reports that she has never smoked. She has never used smokeless tobacco. She reports current alcohol use. She reports that she does not currently use drugs after having used the following drugs: Benzodiazepines and Cocaine.   Allergies  Allergen Reactions   Prednisone Other (See Comments)    Cannot tolerate ORALLY, but can tolerate by shot (interacts with anxiety & bipolarity)   Lithium Carbonate Other (See Comments)    Patient was taken off of this because of a weight gain of 40 pounds   Sulfa Antibiotics Other (See Comments)    From childhood; reaction not recalled    Family History  Adopted: Yes     Prior to Admission medications   Medication Sig Start Date End Date Taking? Authorizing Provider  albuterol (PROVENTIL HFA;VENTOLIN HFA) 108 (90 Base) MCG/ACT inhaler Inhale 2 puffs into the lungs every 6 (six) hours as needed for wheezing or shortness of breath.    [provider]  budesonide (PULMICORT) 0.25 MG/2ML nebulizer solution Take 2 mLs (0.25 mg total) by nebulization 2 (two) times daily. 03/02/20   Lorella Nimrod, MD  buPROPion (WELLBUTRIN XL) 150 MG 24 hr tablet Take 300 mg by mouth daily.  10/01/16   [provider]  calcium citrate-vitamin D (CALCIUM + D) 315-200 MG-UNIT per tablet Take 1 tablet by mouth daily. For bone health Patient taking differently: Take 1  tablet by mouth daily. 10/18/14   Lindell Spar I, NP  cyclobenzaprine (FLEXERIL) 10 MG tablet Take 10 mg by mouth every 8 (eight) hours as needed for muscle spasms.    [provider]  denosumab (PROLIA) 60 MG/ML SOLN injection Inject 60 mg into the skin every 6 (six) months. Administer in upper arm, thigh, or abdomen  NEXT IS DUE 03-2017 Patient not taking: Reported on 03/09/2020 03/05/17   [name], NP  dicyclomine (BENTYL) 20 MG tablet Take 1 tablet (20 mg total) by mouth every 8 (eight) hours as needed for spasms. 12/31/19   Petrucelli, Samantha R, PA-C  docusate sodium (COLACE) 100 MG capsule Take 1 capsule (100 mg total) by mouth 2 (two) times daily. Patient taking differently: Take 100 mg by mouth daily. 03/02/20   Lorella Nimrod, MD  enoxaparin (LOVENOX) 40 MG/0.4ML injection Inject 0.4 mLs (40 mg total) into the skin daily. 02/27/20 03/28/20  Cherlynn June B, PA  EPINEPHrine 0.3 mg/0.3 mL IJ SOAJ injection Inject 0.3 mg into the skin daily as needed for anaphylaxis.  04/10/19   [provider]  famotidine (PEPCID) 20 MG tablet Take 1 tablet (20 mg total) by mouth at bedtime. 09/03/19 02/23/20  Amin, Jeanella Flattery, MD  feeding supplement (ENSURE ENLIVE / ENSURE PLUS) LIQD Take 237 mLs by mouth daily. 03/03/20   Lorella Nimrod, MD  Ferrous Fumarate-Folic Acid 287-8 MG TABS Take 1 tablet by mouth 2 (two) times daily after a meal.    [provider]  FLUoxetine (PROZAC) 20 MG capsule Take 20 mg by mouth 2 (two) times daily.     [provider]  levothyroxine (SYNTHROID, LEVOTHROID) 112 MCG tablet Take 112 mcg by mouth daily before breakfast.  09/04/17   [provider]  magnesium oxide (MAG-OX) 400 (241.3 Mg) MG tablet Take 1 tablet (400 mg total) by mouth daily. 03/03/20   Lorella Nimrod, MD  Multiple Vitamin (MULTIVITAMIN) tablet Take 1 tablet by mouth daily. For low vitamin 10/18/14   Lindell Spar I, NP  naproxen sodium (ALEVE) 220 MG tablet Take 220 mg  by mouth every 12 (twelve) hours as needed (pain).    [provider]  ondansetron (ZOFRAN ODT) 4 MG disintegrating tablet Take 1 tablet (4 mg total) by mouth every 8 (eight) hours as needed for nausea or vomiting. 12/18/19   Joy, Shawn C, PA-C  oxcarbazepine (TRILEPTAL) 600 MG tablet Take 600 mg by mouth daily.     [provider]  oxyCODONE ER 13.5 MG C12A Take 13.5 mg by mouth 2 (two) times daily. 11/22/16   [provider]  Oxycodone HCl 10 MG TABS Take 1 tablet (10 mg total) by mouth every 4 (four) hours as needed (for breakthrough pain). 02/26/20   Dorothyann Peng, PA  pantoprazole (PROTONIX) 40 MG tablet Take 1 tablet (40 mg total) by mouth 2 (two) times daily before a meal. 09/03/19   Amin, Jeanella Flattery, MD  polyethylene glycol (MIRALAX / GLYCOLAX) 17 g packet Take 17 g by mouth daily as needed for moderate constipation or severe constipation. 09/03/19   Amin, Jeanella Flattery, MD  QUEtiapine (SEROQUEL) 100 MG tablet Take 1 tablet (100 mg total) by mouth at bedtime as needed (sleep). 03/02/20   Lorella Nimrod, MD  rizatriptan (MAXALT-MLT) 10 MG disintegrating tablet Take 10 mg by mouth daily as needed for migraine.  11/08/14   [provider]  sucralfate (CARAFATE) 1 g tablet Take 1 tablet (1 g total) by mouth 4 (four) times daily -  with meals and at bedtime. 12/18/19 02/23/20  Joy, Shawn C, PA-C  thiamine 100 MG tablet Take 1 tablet (100 mg total) by mouth daily. 03/03/20   Lorella Nimrod, MD  valACYclovir (VALTREX) 500 MG tablet Take one tablet twice daily 3-5 days then daily as needed. Patient taking differently: Take by mouth See admin instructions. Take 1 tablet (500mg ) by mouth twice daily for 3-5 days then daily as needed for herpes virus infection. 05/01/17   [name], NP    Physical Exam: BP (!) 146/78 (BP Location: Right Arm)   Pulse 86   Temp 98 F (36.7 C) (Axillary)   Resp 16   Ht 5\' 5"  (1.651 m)   Wt 72.6 kg   SpO2 100%   BMI 26.63 kg/m    General: 70 y.o. year-old female here ill-appearing but in no acute distress.   HEENT: Dry mucous membrane.  Ecchymosis to bridge of nose as well as infraorbital area.  EOMI Neck: Supple, trachea medial Cardiovascular: Regular rate and rhythm with no rubs or gallops.  No thyromegaly or JVD noted.  No lower extremity edema. 2/4 pulses in all 4 extremities. Respiratory: Clear to auscultation with no wheezes or rales. Good inspiratory effort. Abdomen: Soft, nontender nondistended with normal bowel sounds x4 quadrants. Muskuloskeletal: No cyanosis, clubbing or edema noted bilaterally Neuro: CN II-XII intact, strength 5/5 x 4, sensation, reflexes intact, alert and oriented x 2 (person and place) Skin: No ulcerative lesions noted or rashes Psychiatry: Judgement and insight appear normal. Mood is appropriate for condition and setting          Labs on Admission:  Basic Metabolic Panel: Recent Labs  Lab 01/13/21 1418 01/13/21 1702  NA 144 148*  K 3.4* 3.3*  CL 105  --   CO2 17*  --   GLUCOSE 221*  --   BUN 169*  --   CREATININE 3.86*  --   CALCIUM 11.4*  --    Liver Function Tests: Recent Labs  Lab 01/13/21 1418  AST 148*  ALT 374*  ALKPHOS 136*  BILITOT 2.5*  PROT 7.9  ALBUMIN 4.1   No results for input(s): LIPASE, AMYLASE in the last 168 hours. No results for input(s): AMMONIA in the last 168 hours. CBC: Recent Labs  Lab 01/13/21 1418 01/13/21 1702  WBC 21.6*  --   NEUTROABS 18.9*  --   HGB 19.3* 16.7*  HCT 58.7* 49.0*  MCV 93.8  --   PLT 438*  --    Cardiac Enzymes: Recent Labs  Lab 01/13/21 1424  CKTOTAL 29*    BNP (last 3 results) No results for input(s): BNP in the last 8760 hours.  ProBNP (last 3 results) No results for input(s): PROBNP in the last 8760 hours.  CBG: No results for input(s): GLUCAP in the last 168 hours.  Radiological Exams on Admission: CT ABDOMEN PELVIS WO CONTRAST  Result Date: 01/13/2021 CLINICAL DATA:  Sepsis EXAM: CT  ABDOMEN AND PELVIS WITHOUT CONTRAST TECHNIQUE: Multidetector CT imaging of the abdomen and pelvis was performed following the standard protocol without IV contrast. COMPARISON:  Virtual colonoscopy dated 11/17/2019 FINDINGS: Lower chest: Although not ordered, almost the entire chest is within the field of view. No evidence of pneumonia. Mild compressive atelectasis in the medial right middle lobe. No pleural effusion or pneumothorax. Large fat containing anterior hernia along the right anterior aspect of the heart. The heart is normal in size. No pericardial effusion. Mild atherosclerotic calcifications of the arch. Hepatobiliary: Unenhanced liver is unremarkable. Gallbladder is unremarkable. No intrahepatic or extrahepatic ductal dilatation. Pancreas: Within normal limits. Spleen: Within normal limits. Adrenals/Urinary Tract: Adrenal glands are within normal limits. Kidneys are within normal limits. No renal calculi or hydronephrosis. Bladder decompressed by an indwelling Foley catheter with trace gas. Stomach/Bowel: Stomach is notable for a moderate hiatal hernia/intrathoracic stomach. No evidence of bowel obstruction. Normal appendix (series 8/image 4). Mild sigmoid diverticulosis, without evidence of diverticulitis. Vascular/Lymphatic: No evidence of abdominal aortic aneurysm. Atherosclerotic calcifications of the abdominal aorta and branch vessels. No suspicious abdominopelvic lymphadenopathy. Reproductive: Uterus is within normal limits. Bilateral ovaries are unremarkable. Other: No abdominopelvic ascites. Musculoskeletal: Severe compression fracture deformity at T3 with mild retropulsion, age indeterminate. Mild superior endplate compression fracture deformity at T8, likely chronic. Prior vertebral augmentation at T12 and L2. Status post PLIF at L3-5. Degenerative changes of the lumbar spine. Left hip arthroplasty. IMPRESSION: No evidence of bowel obstruction. Normal appendix. Mild sigmoid diverticulosis,  without evidence of diverticulitis. Bladder decompressed by an indwelling Foley catheter. No evidence of pneumonia. Moderate hiatal hernia.  Large anterior hernia with fat. Severe  compression fracture deformity at T3 with mild retropulsion, age indeterminate. Electronically Signed   By: Julian Hy M.D.   On: 01/13/2021 20:07   CT Head Wo Contrast  Result Date: 01/13/2021 CLINICAL DATA:  Head trauma EXAM: CT HEAD WITHOUT CONTRAST TECHNIQUE: Contiguous axial images were obtained from the base of the skull through the vertex without intravenous contrast. COMPARISON:  CT brain 01/04/2021, 03/02/2020, 02/23/2020 FINDINGS: Brain: No acute territorial infarction, hemorrhage or intracranial mass is visualized. Atrophy and mild chronic small vessel ischemic changes of the white matter. Small chronic infarcts in the cerebellum. Stable left posterior parafalcine round calcification likely calcified meningioma. Stable ventricle size. Vascular: No hyperdense vessels.  Carotid vascular calcification Skull: No fracture. Stable 11 mm intra osseous ground-glass lesion within the left frontal bone, felt benign. Sinuses/Orbits: No acute finding. Other: None IMPRESSION: 1. No CT evidence for acute intracranial abnormality. 2. Atrophy and mild chronic small vessel ischemic changes of the white matter Electronically Signed   By: Donavan Foil M.D.   On: 01/13/2021 16:12   DG Chest Port 1 View  Result Date: 01/13/2021 CLINICAL DATA:  Sepsis EXAM: PORTABLE CHEST 1 VIEW COMPARISON:  03/09/2020 FINDINGS: Single frontal view of the chest demonstrates a stable cardiac silhouette. Stable hiatal hernia and prominent epicardial fat. No acute airspace disease, effusion, or pneumothorax. No acute bony abnormalities. IMPRESSION: 1. Stable chest, no acute intrathoracic process. Electronically Signed   By: Randa Ngo M.D.   On: 01/13/2021 16:00   US Abdomen Limited RUQ (LIVER/GB)  Result Date: 01/13/2021 CLINICAL DATA:  Right  upper quadrant abdominal pain. EXAM: ULTRASOUND ABDOMEN LIMITED RIGHT UPPER QUADRANT COMPARISON:  Abdominal ultrasound and CT 08/31/2019 FINDINGS: Gallbladder: Physiologically distended. Layering intraluminal sludge. Gallstones on prior ultrasound are not well seen on the current exam. No gallbladder wall thickening. No sonographic Murphy sign noted by sonographer. Common bile duct: Diameter: 6 mm. Liver: Heterogeneous increased parenchymal echogenicity. The liver is difficult to penetrate. Allowing for this, no focal lesion. Technically limited assessment due to altered mental status and difficulty with breath hold. Portal vein is patent on color Doppler imaging with normal direction of blood flow towards the liver. Other: No right upper quadrant ascites. IMPRESSION: 1. Gallbladder sludge. Gallstones on prior ultrasound are not well seen on the current exam. No sonographic findings of acute cholecystitis. 2. No biliary dilatation. 3. Suggestion of hepatic steatosis. Electronically Signed   By: Keith Rake M.D.   On: 01/13/2021 18:10    EKG: I independently viewed the EKG done and my findings are as followed: Normal sinus rhythm at a rate of 85 bpm with APCs  Assessment/Plan Present on Admission:  Acute kidney injury ([place])  Hypokalemia  Acute metabolic encephalopathy  Principal Problem:   Acute kidney injury ([place]) Active Problems:   Hypokalemia   Acute metabolic encephalopathy   Lactic acidosis   Sepsis ([place])   Hypercalcemia   Leukocytosis   Thrombocytosis   Dehydration   Transaminitis  Acute kidney injury BUN/creatinine 169/3.86 (baseline creatinine at 0.5-0.8) Continue gentle hydration Renally adjust medications, avoid nephrotoxic agents/dehydration/hypotension  Presumed sepsis due to unknown source Patient was hypothermic, WBC was elevated, however, there was no known source of infection.  Lactic acid was elevated so patient was empirically started on IV vancomycin, cefepime and  metronidazole, we shall continue with antibiotics at this time with plan to discontinue/de-escalate based on blood culture, urine culture and procalcitonin  Lactic acidosis possibly secondary to multifactorial Lactic acid 4.5 > 3.6; continue to trend lactic acid  This could be due to dehydration, sepsis, alcohol ketoacidosis etc Patient states that she has not had any alcohol drink in 3 years, alcohol level was negative  Acute metabolic encephalopathy-improving Alcohol level was negative Urine drug screen was negative CNS acting drugs will be held at this time Continue fall precaution and neurochecks  Hypokalemia 3.4, this will be replenished  Hypercalcemia Calcium 11.4, continue IV hydration  Transaminitis possibly due to shock liver/biliary sludge AST 148, ALT 374, ALP 136 Continue to monitor liver enzymes  Thrombocytosis possibly reactive Platelets 438, continue to monitor platelet levels  Leukocytosis possibly due to multifactorial including presumed sepsis/hemoconcentration Continue with IV antibiotics as described above for sepsis Continue IV hydration  Dehydration Continue IV hydration  Increased anion gap/hyperglycemia This may be due to patient's acidotic state, considering bicarb of 17 and elevated lactic acid level CBG was also elevated at 221, but patient has no history of type 2 diabetes mellitus, hemoglobin A1c will be checked Continue IV hydration Continue to monitor anion gap for closure  ??  Hyperthyroidism TSH was 15.713, T4 > 5.5 No clinical signs of hyperthyroidism Consider repeating this in a nonacute state   DVT prophylaxis: Lovenox  Code Status: Full code  Family Communication: None at bedside  Disposition Plan:  Patient is from:                        home Anticipated DC to:                   SNF or family members home Anticipated DC date:               2-3 days Anticipated DC barriers:          Patient requires inpatient management due to  acute kidney injury, lactic acidosis, presumed sepsis requiring IV hydration and further pending test to rule out sepsis    Consults called: None  Admission status: Inpatient    Bernadette Hoit MD [facility]  01/14/2021, 1:16 AM

## 2021-01-15 ENCOUNTER — Other Ambulatory Visit: Payer: Self-pay

## 2021-01-15 DIAGNOSIS — N179 Acute kidney failure, unspecified: Secondary | ICD-10-CM | POA: Diagnosis not present

## 2021-01-15 LAB — COMPREHENSIVE METABOLIC PANEL
ALT: 162 U/L — ABNORMAL HIGH (ref 0–44)
AST: 59 U/L — ABNORMAL HIGH (ref 15–41)
Albumin: 2.4 g/dL — ABNORMAL LOW (ref 3.5–5.0)
Alkaline Phosphatase: 69 U/L (ref 38–126)
Anion gap: 10 (ref 5–15)
BUN: 87 mg/dL — ABNORMAL HIGH (ref 8–23)
CO2: 21 mmol/L — ABNORMAL LOW (ref 22–32)
Calcium: 9.2 mg/dL (ref 8.9–10.3)
Chloride: 114 mmol/L — ABNORMAL HIGH (ref 98–111)
Creatinine, Ser: 2.11 mg/dL — ABNORMAL HIGH (ref 0.44–1.00)
GFR, Estimated: 25 mL/min — ABNORMAL LOW (ref >60)
Glucose, Bld: 111 mg/dL — ABNORMAL HIGH (ref 70–99)
Potassium: 3.2 mmol/L — ABNORMAL LOW (ref 3.5–5.1)
Sodium: 145 mmol/L (ref 135–145)
Total Bilirubin: 1.1 mg/dL (ref 0.3–1.2)
Total Protein: 4.7 g/dL — ABNORMAL LOW (ref 6.5–8.1)

## 2021-01-15 LAB — CBC
HCT: 36 % (ref 36.0–46.0)
Hemoglobin: 11.6 g/dL — ABNORMAL LOW (ref 12.0–15.0)
MCH: 30.9 pg (ref 26.0–34.0)
MCHC: 32.2 g/dL (ref 30.0–36.0)
MCV: 96 fL (ref 80.0–100.0)
Platelets: 207 10*3/uL (ref 150–400)
RBC: 3.75 MIL/uL — ABNORMAL LOW (ref 3.87–5.11)
RDW: 14.7 % (ref 11.5–15.5)
WBC: 11.1 10*3/uL — ABNORMAL HIGH (ref 4.0–10.5)
nRBC: 0 % (ref 0.0–0.2)

## 2021-01-15 LAB — GLUCOSE, CAPILLARY
Glucose-Capillary: 97 mg/dL (ref 70–99)
Glucose-Capillary: 102 mg/dL — ABNORMAL HIGH (ref 70–99)
Glucose-Capillary: 215 mg/dL — ABNORMAL HIGH (ref 70–99)
Glucose-Capillary: 240 mg/dL — ABNORMAL HIGH (ref 70–99)

## 2021-01-15 LAB — CK: Total CK: 100 U/L (ref 38–234)

## 2021-01-15 LAB — MAGNESIUM: Magnesium: 2.1 mg/dL (ref 1.7–2.4)

## 2021-01-15 LAB — LACTIC ACID, PLASMA: Lactic Acid, Venous: 0.8 mmol/L (ref 0.5–1.9)

## 2021-01-15 MED ORDER — POTASSIUM CHLORIDE CRYS ER 20 MEQ PO TBCR
40.0000 meq | EXTENDED_RELEASE_TABLET | ORAL | Status: AC
  Administered 2021-01-15 (×2): 40 meq via ORAL
  Filled 2021-01-15 (×2): qty 2

## 2021-01-15 NOTE — Progress Notes (Signed)
PROGRESS NOTE    Victoria Levine  XBD:[id number] DOB: [birth year] DOA: 01/13/2021 PCP: [name], MD   Brief Narrative:  70 year old with history of GERD, asthma, hypothyroidism, alcohol use, bipolar, osteoporosis, anxiety comes to the hospital with complaints of weakness and confusion.  Apparently patient was not seen by her neighbor for several days therefore cousin went to check up on her and was noted to be confused therefore EMS was called and she was brought to the hospital.  Upon arrival she was noted to be dehydrated with evidence of mild rhabdomyolysis and acute kidney injury.  Chest x-ray, CT abdomen pelvis was unremarkable.  Right upper quadrant showed some hepatic steatosis.  CT head was negative.  She was started on broad-spectrum antibiotics, which was eventually stopped. AKI improved with IVF.    Assessment & Plan:   Principal Problem:   Acute kidney injury ([place]) Active Problems:   Hypokalemia   Acute metabolic encephalopathy   Lactic acidosis   Sepsis (H[facility])   Hypercalcemia   Leukocytosis   Thrombocytosis   Dehydration   Transaminitis   Acute kidney injury - Suspect prerenal from dehydration.  Baseline creatinine 0.8.  Admission creatinine 3.86 -> 3.14 >2.11 - Renal US if Cr fails to improve with IVF.   SIRS Leukocytosis, improving -no obvious evidence of infection at this time.  Procalcitonin 0.2, UA is unremarkable, chest x-ray is negative.  Follow culture data.  Will discontinue antibiotics and monitor clinically.   Hypokalemia - Repletion as necessary   Lactic acidosis/ Dehydration.  Increased anion gap/hyperglycemia -Likely from dehydration. Now on NS, will recheck Lactate today to ensure resolution.   Acute metabolic encephalopathy -Resolved.  CT head is negative-likely from dehydration.  Continue to monitor.  Hypercalcemia Resolved with IVF  Transaminitis possibly due to shock liver/biliary sludge AST 148, ALT 374, ALP 136 -Trend LFTs  for now.  Right upper quadrant ultrasound shows some hepatic steatosis  Thrombocytosis possibly reactive -From dehydration.  Resolved    ??  Hyperthyroidism TSH was 15.713, T4 > 5.5 No clinical signs of hyperthyroidism Consider repeatin-elevated TSH and T4.  Repeat labs outpatient g this in a nonacute state  PT/OT- pending. Will consult TOC    DVT prophylaxis: enoxaparin (LOVENOX) injection 30 mg Start: 01/13/21 2330 SCDs Start: 01/13/21 2321 Code Status: DNR - ptn confirmed.  Family Communication:    Status is: Inpatient  Remains inpatient appropriate because: Ongoing evaluation for acute kidney injury getting IV fluids.  Needs PT/OT also for safe disposition planning upon discharge   Nutritional status  Nutrition Problem: Increased nutrient needs Etiology: acute illness  Signs/Symptoms: estimated needs  Interventions: Ensure Enlive (each supplement provides 350kcal and 20 grams of protein)  Body mass index is 25.28 kg/m.  Pressure Injury 03/09/20 Sacrum Posterior Stage 1 -  Intact skin with non-blanchable redness of a localized area usually over a bony prominence. dressing from nursing home in place on the sacrum, but to the right stage 1 noted.  (Active)  03/09/20 1600  Location: Sacrum  Location Orientation: Posterior  Staging: Stage 1 -  Intact skin with non-blanchable redness of a localized area usually over a bony prominence.  Wound Description (Comments): dressing from nursing home in place on the sacrum, but to the right stage 1 noted.   Present on Admission: Yes          Subjective: Feeling little better, Still weak overall and not quite back to her baseline.   Review of Systems Otherwise negative except as per HPI,  including: General = no fevers, chills, dizziness,  fatigue HEENT/EYES = negative for loss of vision, double vision, blurred vision,  sore throa Cardiovascular= negative for chest pain, palpitation Respiratory/lungs= negative for  shortness of breath, cough, wheezing; hemoptysis,  Gastrointestinal= negative for nausea, vomiting, abdominal pain Genitourinary= negative for Dysuria MSK = Negative for arthralgia, myalgias Neurology= Negative for headache, numbness, tingling  Psychiatry= Negative for suicidal and homocidal ideation Skin= Negative for Rash   Examination:  Constitutional: Not in acute distress Respiratory: Clear to auscultation bilaterally Cardiovascular: Normal sinus rhythm, no rubs Abdomen: Nontender nondistended good bowel sounds Musculoskeletal: No edema noted Skin: bruising around her left eye and LUE noted.  Neurologic: CN 2-12 grossly intact.  And nonfocal Psychiatric: Normal judgment and insight. Alert and oriented x 3. Normal mood.    Objective: Vitals:   01/14/21 0938 01/14/21 1626 01/14/21 2046 01/15/21 0459  BP: 114/65 (!) 151/92 (!) 152/92 (!) 153/84  Pulse: 80 67 65 65  Resp: 18 18 20 16   Temp: 98.6 F (37 C) 98.4 F (36.9 C) (!) 97.4 F (36.3 C) 97.8 F (36.6 C)  TempSrc:   Oral Oral  SpO2: 98% 98% 100% 94%  Weight:      Height:        Intake/Output Summary (Last 24 hours) at 01/15/2021 0815 Last data filed at 01/15/2021 0600 Gross per 24 hour  Intake 3118.38 ml  Output 2000 ml  Net 1118.38 ml   Filed Weights   01/13/21 1420 01/13/21 2230  Weight: 72.6 kg 68.9 kg     Data Reviewed:   CBC: Recent Labs  Lab 01/13/21 1418 01/13/21 1702 01/14/21 0722  WBC 21.6*  --  15.2*  NEUTROABS 18.9*  --   --   HGB 19.3* 16.7* 14.7  HCT 58.7* 49.0* 44.7  MCV 93.8  --  94.7  PLT 438*  --  716   Basic Metabolic Panel: Recent Labs  Lab 01/13/21 1418 01/13/21 1702 01/14/21 0722 01/14/21 1013  NA 144 148*  --  145  K 3.4* 3.3*  --  3.1*  CL 105  --   --  110  CO2 17*  --   --  18*  GLUCOSE 221*  --   --  207*  BUN 169*  --   --  135*  CREATININE 3.86*  --   --  3.14*  CALCIUM 11.4*  --   --  9.6  MG  --   --  2.4  --   PHOS  --   --  6.2*  --     GFR: Estimated Creatinine Clearance: 16 mL/min (A) (by C-G formula based on SCr of 3.14 mg/dL (H)). Liver Function Tests: Recent Labs  Lab 01/13/21 1418  AST 148*  ALT 374*  ALKPHOS 136*  BILITOT 2.5*  PROT 7.9  ALBUMIN 4.1   No results for input(s): LIPASE, AMYLASE in the last 168 hours. No results for input(s): AMMONIA in the last 168 hours. Coagulation Profile: Recent Labs  Lab 01/13/21 1418 01/13/21 1630 01/14/21 0722  INR 1.2 1.2 1.3*   Cardiac Enzymes: Recent Labs  Lab 01/13/21 1424  CKTOTAL 29*   BNP (last 3 results) No results for input(s): PROBNP in the last 8760 hours. HbA1C: Recent Labs    01/14/21 0722  HGBA1C 5.6   CBG: Recent Labs  Lab 01/14/21 0609 01/14/21 1115 01/14/21 1624 01/14/21 2316 01/15/21 0545  GLUCAP 207* 189* 151* 132* 97   Lipid Profile: No results for input(s): CHOL, HDL,  LDLCALC, TRIG, CHOLHDL, LDLDIRECT in the last 72 hours. Thyroid Function Tests: Recent Labs    01/13/21 1424  TSH 15.713*  FREET4 >5.50*   Anemia Panel: No results for input(s): VITAMINB12, FOLATE, FERRITIN, TIBC, IRON, RETICCTPCT in the last 72 hours. Sepsis Labs: Recent Labs  Lab 01/13/21 1525 01/13/21 1715 01/14/21 0722  PROCALCITON  --   --  0.20  LATICACIDVEN 4.5* 3.6* 3.1*    Recent Results (from the past 240 hour(s))  Resp Panel by RT-PCR (Flu A&B, Covid) Nasopharyngeal Swab     Status: None   Collection Time: 01/13/21  2:18 PM   Specimen: Nasopharyngeal Swab; Nasopharyngeal(NP) swabs in vial transport medium  Result Value Ref Range Status   SARS Coronavirus 2 by RT PCR NEGATIVE NEGATIVE Final    Comment: (NOTE) SARS-CoV-2 target nucleic acids are NOT DETECTED.  The SARS-CoV-2 RNA is generally detectable in upper respiratory specimens during the acute phase of infection. The lowest concentration of SARS-CoV-2 viral copies this assay can detect is 138 copies/mL. A negative result does not preclude SARS-Cov-2 infection and should  not be used as the sole basis for treatment or other patient management decisions. A negative result may occur with  improper specimen collection/handling, submission of specimen other than nasopharyngeal swab, presence of viral mutation(s) within the areas targeted by this assay, and inadequate number of viral copies(<138 copies/mL). A negative result must be combined with clinical observations, patient history, and epidemiological information. The expected result is Negative.  Fact Sheet for Patients:  EntrepreneurPulse.com.au  Fact Sheet for Healthcare Providers:  IncredibleEmployment.be  This test is no t yet approved or cleared by the [place] FDA and  has been authorized for detection and/or diagnosis of SARS-CoV-2 by FDA under an Emergency Use Authorization (EUA). This EUA will remain  in effect (meaning this test can be used) for the duration of the COVID-19 declaration under Section 564(b)(1) of the Act, 21 U.S.C.section 360bbb-3(b)(1), unless the authorization is terminated  or revoked sooner.       Influenza A by PCR NEGATIVE NEGATIVE Final   Influenza B by PCR NEGATIVE NEGATIVE Final    Comment: (NOTE) The Xpert Xpress SARS-CoV-2/FLU/RSV plus assay is intended as an aid in the diagnosis of influenza from Nasopharyngeal swab specimens and should not be used as a sole basis for treatment. Nasal washings and aspirates are unacceptable for Xpert Xpress SARS-CoV-2/FLU/RSV testing.  Fact Sheet for Patients: EntrepreneurPulse.com.au  Fact Sheet for Healthcare Providers: IncredibleEmployment.be  This test is not yet approved or cleared by the [place] FDA and has been authorized for detection and/or diagnosis of SARS-CoV-2 by FDA under an Emergency Use Authorization (EUA). This EUA will remain in effect (meaning this test can be used) for the duration of the COVID-19 declaration under  Section 564(b)(1) of the Act, 21 U.S.C. section 360bbb-3(b)(1), unless the authorization is terminated or revoked.  Performed at [facility], [place] [address] [place], [place] [address]   Blood Culture (routine x 2)     Status: None (Preliminary result)   Collection Time: 01/13/21  2:50 PM   Specimen: BLOOD  Result Value Ref Range Status   Specimen Description BLOOD LEFT ANTECUBITAL  Final   Special Requests   Final    BOTTLES DRAWN AEROBIC AND ANAEROBIC Blood Culture adequate volume   Culture   Final    NO GROWTH < 24 HOURS Performed at [facility], [place] [address] [place], [place] [address]    Report Status PENDING  Incomplete  Urine Culture     Status: None   Collection Time: 01/13/21  5:29 PM   Specimen: In/Out Cath Urine  Result Value Ref Range Status   Specimen Description IN/OUT CATH URINE  Final   Special Requests NONE  Final   Culture   Final    NO GROWTH Performed at [facility], [place] [address], [place], [place] [address]    Report Status 01/14/2021 FINAL  Final         Radiology Studies: CT ABDOMEN PELVIS WO CONTRAST  Result Date: 01/13/2021 CLINICAL DATA:  Sepsis EXAM: CT ABDOMEN AND PELVIS WITHOUT CONTRAST TECHNIQUE: Multidetector CT imaging of the abdomen and pelvis was performed following the standard protocol without IV contrast. COMPARISON:  Virtual colonoscopy dated 11/17/2019 FINDINGS: Lower chest: Although not ordered, almost the entire chest is within the field of view. No evidence of pneumonia. Mild compressive atelectasis in the medial right middle lobe. No pleural effusion or pneumothorax. Large fat containing anterior hernia along the right anterior aspect of the heart. The heart is normal in size. No pericardial effusion. Mild atherosclerotic calcifications of the arch. Hepatobiliary: Unenhanced liver is unremarkable. Gallbladder is unremarkable. No intrahepatic or extrahepatic ductal dilatation. Pancreas: Within normal limits.  Spleen: Within normal limits. Adrenals/Urinary Tract: Adrenal glands are within normal limits. Kidneys are within normal limits. No renal calculi or hydronephrosis. Bladder decompressed by an indwelling Foley catheter with trace gas. Stomach/Bowel: Stomach is notable for a moderate hiatal hernia/intrathoracic stomach. No evidence of bowel obstruction. Normal appendix (series 8/image 4). Mild sigmoid diverticulosis, without evidence of diverticulitis. Vascular/Lymphatic: No evidence of abdominal aortic aneurysm. Atherosclerotic calcifications of the abdominal aorta and branch vessels. No suspicious abdominopelvic lymphadenopathy. Reproductive: Uterus is within normal limits. Bilateral ovaries are unremarkable. Other: No abdominopelvic ascites. Musculoskeletal: Severe compression fracture deformity at T3 with mild retropulsion, age indeterminate. Mild superior endplate compression fracture deformity at T8, likely chronic. Prior vertebral augmentation at T12 and L2. Status post PLIF at L3-5. Degenerative changes of the lumbar spine. Left hip arthroplasty. IMPRESSION: No evidence of bowel obstruction. Normal appendix. Mild sigmoid diverticulosis, without evidence of diverticulitis. Bladder decompressed by an indwelling Foley catheter. No evidence of pneumonia. Moderate hiatal hernia.  Large anterior hernia with fat. Severe compression fracture deformity at T3 with mild retropulsion, age indeterminate. Electronically Signed   By: Julian Hy M.D.   On: 01/13/2021 20:07   CT Head Wo Contrast  Result Date: 01/13/2021 CLINICAL DATA:  Head trauma EXAM: CT HEAD WITHOUT CONTRAST TECHNIQUE: Contiguous axial images were obtained from the base of the skull through the vertex without intravenous contrast. COMPARISON:  CT brain 01/04/2021, 03/02/2020, 02/23/2020 FINDINGS: Brain: No acute territorial infarction, hemorrhage or intracranial mass is visualized. Atrophy and mild chronic small vessel ischemic changes of the  white matter. Small chronic infarcts in the cerebellum. Stable left posterior parafalcine round calcification likely calcified meningioma. Stable ventricle size. Vascular: No hyperdense vessels.  Carotid vascular calcification Skull: No fracture. Stable 11 mm intra osseous ground-glass lesion within the left frontal bone, felt benign. Sinuses/Orbits: No acute finding. Other: None IMPRESSION: 1. No CT evidence for acute intracranial abnormality. 2. Atrophy and mild chronic small vessel ischemic changes of the white matter Electronically Signed   By: Donavan Foil M.D.   On: 01/13/2021 16:12   DG Chest Port 1 View  Result Date: 01/13/2021 CLINICAL DATA:  Sepsis EXAM: PORTABLE CHEST 1 VIEW COMPARISON:  03/09/2020 FINDINGS: Single frontal view of the chest demonstrates a stable cardiac silhouette.  Stable hiatal hernia and prominent epicardial fat. No acute airspace disease, effusion, or pneumothorax. No acute bony abnormalities. IMPRESSION: 1. Stable chest, no acute intrathoracic process. Electronically Signed   By: Randa Ngo M.D.   On: 01/13/2021 16:00   US Abdomen Limited RUQ (LIVER/GB)  Result Date: 01/13/2021 CLINICAL DATA:  Right upper quadrant abdominal pain. EXAM: ULTRASOUND ABDOMEN LIMITED RIGHT UPPER QUADRANT COMPARISON:  Abdominal ultrasound and CT 08/31/2019 FINDINGS: Gallbladder: Physiologically distended. Layering intraluminal sludge. Gallstones on prior ultrasound are not well seen on the current exam. No gallbladder wall thickening. No sonographic Murphy sign noted by sonographer. Common bile duct: Diameter: 6 mm. Liver: Heterogeneous increased parenchymal echogenicity. The liver is difficult to penetrate. Allowing for this, no focal lesion. Technically limited assessment due to altered mental status and difficulty with breath hold. Portal vein is patent on color Doppler imaging with normal direction of blood flow towards the liver. Other: No right upper quadrant ascites. IMPRESSION: 1.  Gallbladder sludge. Gallstones on prior ultrasound are not well seen on the current exam. No sonographic findings of acute cholecystitis. 2. No biliary dilatation. 3. Suggestion of hepatic steatosis. Electronically Signed   By: Keith Rake M.D.   On: 01/13/2021 18:10        Scheduled Meds:  Chlorhexidine Gluconate Cloth  6 each Topical Daily   enoxaparin (LOVENOX) injection  30 mg Subcutaneous QHS   feeding supplement  237 mL Oral TID BM   Continuous Infusions:  sodium chloride 75 mL/hr at 01/14/21 2218     LOS: 2 days   Time spent= 35 mins    Zayne Draheim Arsenio Loader, MD [facility]  If 7PM-7AM, please contact night-coverage  01/15/2021, 8:15 AM

## 2021-01-15 NOTE — Evaluation (Signed)
Physical Therapy Evaluation Patient Details Name: [name] MRN: [id number] DOB: 01-06-1951 Today's Date: 01/15/2021  History of Present Illness  ILLONA Levine is a 70 y.o. female presents to the emergency department due to presumed sepsis, found to have acute kidney injury, hypokalemia, acute metabolic encephalopathy, mild rhabdomyolysis, and hypothermia. PHMx: GERD, asthma, hypothyroidism, alcohol abuse, bipolar disorder, osteoporosis, anxiety disorder.  Clinical Impression   Pt admitted secondary to problem above with deficits below. PTA patient was living alone, however was not seen for several days and admitted with rhabdomyolysis. She states she was not taking care of herself. Pt currently requires total assist for mobility with decr cognition and therefore limited participation. Pt was very sleepy with frequent yawning during session. If pt's cognition significantly clears, she may progress to a point to consider home with support, but currently needs constant care (total assist).  Anticipate patient will benefit from PT to address problems listed below.Will continue to follow acutely to maximize functional mobility independence and safety.          Recommendations for follow up therapy are one component of a multi-disciplinary discharge planning process, led by the attending physician.  Recommendations may be updated based on patient status, additional functional criteria and insurance authorization.  Follow Up Recommendations Skilled nursing-short term rehab (<3 hours/day)    Assistance Recommended at Discharge Frequent or constant Supervision/Assistance  Functional Status Assessment Patient has had a recent decline in their functional status and demonstrates the ability to make significant improvements in function in a reasonable and predictable amount of time.  Equipment Recommendations  Wheelchair (measurements PT);Wheelchair cushion (measurements PT);Hospital bed     Recommendations for Other Services       Precautions / Restrictions Precautions Precautions: Fall Restrictions Weight Bearing Restrictions: No      Mobility  Bed Mobility Overal bed mobility: Needs Assistance Bed Mobility: Rolling Rolling: Mod assist (Mod A to roll in attempt to sit up (twice as pt returned to supine and then rolled again)) Sidelying to sit:  (didn't want to sit up on side of bed)     Sit to sidelying:  (wanted to lay back down) General bed mobility comments: step by step ues for rolling; did not assist with scooting up toward [facility] "too tired"    Transfers                   General transfer comment: Would not attempt with me    Ambulation/Gait                Stairs            Wheelchair Mobility    Modified Rankin (Stroke Patients Only)       Balance Overall balance assessment: Needs assistance Sitting-balance support: Bilateral upper extremity supported;Feet supported Sitting balance-Leahy Scale: Poor                                       Pertinent Vitals/Pain Pain Assessment: Faces Faces Pain Scale: Hurts a little bit Pain Location: abd with rolling Pain Descriptors / Indicators: Cramping Pain Intervention(s): Limited activity within patient's tolerance;Monitored during session;Repositioned    Home Living Family/patient expects to be discharged to:: Skilled nursing facility Living Arrangements: Alone Available Help at Discharge: Available PRN/intermittently;Friend(s) Type of Home: House Home Access: Level entry       Home Layout: One level Home Equipment: Rollator (4 wheels);Rolling Walker (2  wheels);Shower seat Additional Comments: per notes, patient has had multiple falls in the past week    Prior Function Prior Level of Function : Independent/Modified Independent;Driving             Mobility Comments: was living alone; multiple recent falls per chart       Hand Dominance   Dominant  Hand: Right    Extremity/Trunk Assessment   Upper Extremity Assessment Upper Extremity Assessment: Defer to OT evaluation    Lower Extremity Assessment Lower Extremity Assessment: Generalized weakness;Difficult to assess due to impaired cognition       Communication   Communication: Expressive difficulties (slightly slurred; very sleepy)  Cognition Arousal/Alertness: Lethargic (easily aroused, but sleepy wtih lots of yawning) Behavior During Therapy: Flat affect Overall Cognitive Status: No family/caregiver present to determine baseline cognitive functioning Area of Impairment: Following commands;Awareness;Problem solving;Orientation;Attention;Safety/judgement                 Orientation Level: Time (November) Current Attention Level: Sustained   Following Commands: Follows one step commands inconsistently (Appears more that she does not want to follow some commands v. can't (ie: bend up your knees so you can roll over and sit up--"oh, I can't do that"; but then bend up your knees so you can scoot yourself up in bed--weak, but did it)) Safety/Judgement: Decreased awareness of deficits Awareness: Intellectual Problem Solving: Requires verbal cues;Requires tactile cues;Slow processing;Decreased initiation;Difficulty sequencing General Comments: Goal is to get back home to her cat "Chili"; however when educated on the best way to do that is to get up and move to get her strength back--however this did not presuade her to try and do any more with me. Not motivated by sitting up to eat her lunch despite saying she hasn't eaten in days        General Comments      Exercises     Assessment/Plan    PT Assessment Patient needs continued PT services  PT Problem List Decreased strength;Decreased activity tolerance;Decreased balance;Decreased mobility;Decreased cognition;Decreased knowledge of use of DME;Decreased safety awareness       PT Treatment Interventions DME  instruction;Gait training;Functional mobility training;Therapeutic activities;Therapeutic exercise;Balance training;Cognitive remediation;Patient/family education    PT Goals (Current goals can be found in the Care Plan section)  Acute Rehab PT Goals Patient Stated Goal: go home to see her cat PT Goal Formulation: With patient Time For Goal Achievement: 01/29/21 Potential to Achieve Goals: Fair    Frequency Min 2X/week   Barriers to discharge Decreased caregiver support      Co-evaluation               AM-PAC PT "6 Clicks" Mobility  Outcome Measure Help needed turning from your back to your side while in a flat bed without using bedrails?: A Lot Help needed moving from lying on your back to sitting on the side of a flat bed without using bedrails?: Total Help needed moving to and from a bed to a chair (including a wheelchair)?: Total Help needed standing up from a chair using your arms (e.g., wheelchair or bedside chair)?: Total Help needed to walk in hospital room?: Total Help needed climbing 3-5 steps with a railing? : Total 6 Click Score: 7    End of Session   Activity Tolerance: Patient limited by fatigue;Patient limited by lethargy Patient left: in bed;with call bell/phone within reach;with bed alarm set Nurse Communication: Need for lift equipment;Mobility status;Other (comment) (?cognition) PT Visit Diagnosis: Muscle weakness (generalized) (M62.81);Repeated falls (R29.6)  Time: 8403-7543 PT Time Calculation (min) (ACUTE ONLY): 27 min   Charges:   PT Evaluation $PT Eval Moderate Complexity: 1 Mod PT Treatments $Therapeutic Activity: 8-22 mins         Arby Barrette, PT Acute Rehabilitation Services  Pager [phone] Office [phone]   Rexanne Mano 01/15/2021, 1:18 PM

## 2021-01-15 NOTE — Evaluation (Signed)
Occupational Therapy Evaluation Patient Details Name: Victoria Levine MRN: [id number] DOB: 03-Sep-1950 Today's Date: 01/15/2021   History of Present Illness Victoria Levine is a 70 y.o. female presents to the emergency department due to presumed sepsis, found to have acute kidney injury, hypokalemia, acute metabolic encephalopathy, mild rhabdomyolysis, and hypothermia. PHMx: GERD, asthma, hypothyroidism, alcohol abuse, bipolar disorder, osteoporosis, anxiety disorder.   Clinical Impression   This 70 yo female admitted with above presents to acute OT with PLOF per her report of being independent with basic ADLs and having a person come in once a month for deep cleaning. Currently pt is total A-setup/S for basic ADLs with showing some capability to be at a higher level, but limited in what she wanted to do with me at this time. Feel she will continue to benefit from acute OT with follow up at SNF to get to a S level or better.     Recommendations for follow up therapy are one component of a multi-disciplinary discharge planning process, led by the attending physician.  Recommendations may be updated based on patient status, additional functional criteria and insurance authorization.   Follow Up Recommendations  Skilled nursing-short term rehab (<3 hours/day)    Assistance Recommended at Discharge Frequent or constant Supervision/Assistance  Functional Status Assessment  Patient has had a recent decline in their functional status and demonstrates the ability to make significant improvements in function in a reasonable and predictable amount of time.  Equipment Recommendations  Other (comment) (TBD next venue)       Precautions / Restrictions Precautions Precautions: Fall Restrictions Weight Bearing Restrictions: No      Mobility Bed Mobility Overal bed mobility: Needs Assistance Bed Mobility: Rolling;Sidelying to Sit;Sit to Sidelying Rolling: Supervision;Mod assist (Mod A to  roll in attempt to sit up; S to roll to get bed pad changed out from under her.) Sidelying to sit: Mod assist;HOB elevated (didn't want to sit up on side of bed)     Sit to sidelying: Supervision (wanted to lay back down) General bed mobility comments: With step by step VCs pt was able to bend her knees up and reach with Bil UEs for upper bed rails to scoot herself up in bed with min A    Transfers                   General transfer comment: Would not attempt with me      Balance Overall balance assessment: Needs assistance Sitting-balance support: Bilateral upper extremity supported;Feet supported Sitting balance-Leahy Scale: Poor                                     ADL either performed or assessed with clinical judgement   ADL Overall ADL's : Needs assistance/impaired Eating/Feeding: Set up;Bed level   Grooming: Supervision/safety;Set up;Wash/dry face;Bed level   Upper Body Bathing: Minimal assistance;Bed level   Lower Body Bathing: Total assistance;Bed level   Upper Body Dressing : Maximal assistance;Bed level   Lower Body Dressing: Total assistance;Bed level                       Vision Patient Visual Report: No change from baseline              Pertinent Vitals/Pain Pain Assessment: Faces Faces Pain Scale: Hurts a little bit Pain Location: Initially said no pain. When started to move she said  her back hurt. When asked at end of session she said "I hurt all over, but that is nothing new" Pain Descriptors / Indicators: Sore;Aching Pain Intervention(s): Limited activity within patient's tolerance;Monitored during session;Repositioned     Hand Dominance Right   Extremity/Trunk Assessment Upper Extremity Assessment Upper Extremity Assessment: Generalized weakness           Communication Communication Communication:  ("tangled up" words at times)   Cognition Arousal/Alertness: Awake/alert Behavior During Therapy:   (Agreeable to session to get up and move, but then when it came to moving did not want to) Overall Cognitive Status: No family/caregiver present to determine baseline cognitive functioning Area of Impairment: Orientation;Following commands;Awareness;Problem solving                 Orientation Level: Time ("2020 something")     Following Commands: Follows one step commands inconsistently (Appears more that she does not want to follow some commands v. can't (ie: bend up your knees so you can roll over and sit up--"oh, I can't do that"; but then bend up your knees so you can scoot yourself up in bed--weak, but did it))   Awareness: Intellectual Problem Solving: Requires verbal cues General Comments: Goal is to get back home to her cat "Chili"; however when educated on the best way to do that is to get up and move to get her strength back--however this did not presuade her to try and do any more with me. Informed her that PT would be by to see her later to again work on getting up and her response was "I came in on a strecher, I can go out one one"--again I re-iterated to her that if she wanted to be able to get go home and see her cat, then getting up and moving was what would help with this.                Home Living Family/patient expects to be discharged to:: Skilled nursing facility Living Arrangements: Alone Available Help at Discharge: Available PRN/intermittently;Friend(s) Type of Home: House Home Access: Level entry     Home Layout: One level     Bathroom Shower/Tub: Walk-in shower;Curtain   Bathroom Toilet: Handicapped height Bathroom Accessibility: No   Home Equipment: Rollator (4 wheels);Rolling Walker (2 wheels);Shower seat          Prior Functioning/Environment Prior Level of Function : Independent/Modified Independent;Driving                        OT Problem List: Impaired balance (sitting and/or standing);Decreased safety awareness;Decreased  cognition;Pain      OT Treatment/Interventions: Self-care/ADL training;DME and/or AE instruction;Patient/family education;Balance training    OT Goals(Current goals can be found in the care plan section) Acute Rehab OT Goals Patient Stated Goal: to go home and see my cat "Chili" OT Goal Formulation: With patient Time For Goal Achievement: 01/29/21 Potential to Achieve Goals: Good  OT Frequency: Min 2X/week              AM-PAC OT "6 Clicks" Daily Activity     Outcome Measure Help from another person eating meals?: A Little Help from another person taking care of personal grooming?: A Little Help from another person toileting, which includes using toliet, bedpan, or urinal?: A Lot Help from another person bathing (including washing, rinsing, drying)?: A Lot Help from another person to put on and taking off regular upper body clothing?: A Lot Help from another person to  put on and taking off regular lower body clothing?: Total 6 Click Score: 13   End of Session Nurse Communication: Mobility status (sat up on EOB reluctantly, but would not get up OOB to chair)  Activity Tolerance:  (limited by wanting to lay back down) Patient left: in bed;with call bell/phone within reach;with bed alarm set  OT Visit Diagnosis: Other abnormalities of gait and mobility (R26.89);Muscle weakness (generalized) (M62.81);Other symptoms and signs involving cognitive function;Pain Pain - part of body:  ("all over-nothing new", did report specific back pain when going from left sidelying to sit on EOB)                Time: 5830-9407 OT Time Calculation (min): 27 min Charges:  OT General Charges $OT Visit: 1 Visit OT Evaluation $OT Eval Moderate Complexity: 1 Mod OT Treatments $Self Care/Home Management : 8-22 mins  Golden Circle, OTR/L [facility] [employer] Pager [phone] Office [phone]    Almon Register 01/15/2021, 9:43 AM

## 2021-01-15 NOTE — Progress Notes (Signed)
Patient has an order for Q6 CBG checks. 1800 CBG check was 240, rechecked and it was 215. MD Reesa Chew was notified. Patients family member just brought her a milkshake from [employer] about an hour ago. MD is aware.

## 2021-01-16 DIAGNOSIS — N179 Acute kidney failure, unspecified: Secondary | ICD-10-CM | POA: Diagnosis not present

## 2021-01-16 LAB — CBC
HCT: 38.6 % (ref 36.0–46.0)
Hemoglobin: 12.3 g/dL (ref 12.0–15.0)
MCH: 30.9 pg (ref 26.0–34.0)
MCHC: 31.9 g/dL (ref 30.0–36.0)
MCV: 97 fL (ref 80.0–100.0)
Platelets: 223 10*3/uL (ref 150–400)
RBC: 3.98 MIL/uL (ref 3.87–5.11)
RDW: 14.5 % (ref 11.5–15.5)
WBC: 10.6 10*3/uL — ABNORMAL HIGH (ref 4.0–10.5)
nRBC: 0 % (ref 0.0–0.2)

## 2021-01-16 LAB — COMPREHENSIVE METABOLIC PANEL
ALT: 117 U/L — ABNORMAL HIGH (ref 0–44)
AST: 35 U/L (ref 15–41)
Albumin: 2.3 g/dL — ABNORMAL LOW (ref 3.5–5.0)
Alkaline Phosphatase: 77 U/L (ref 38–126)
Anion gap: 6 (ref 5–15)
BUN: 46 mg/dL — ABNORMAL HIGH (ref 8–23)
CO2: 24 mmol/L (ref 22–32)
Calcium: 9.1 mg/dL (ref 8.9–10.3)
Chloride: 116 mmol/L — ABNORMAL HIGH (ref 98–111)
Creatinine, Ser: 1.34 mg/dL — ABNORMAL HIGH (ref 0.44–1.00)
GFR, Estimated: 42 mL/min — ABNORMAL LOW (ref >60)
Glucose, Bld: 100 mg/dL — ABNORMAL HIGH (ref 70–99)
Potassium: 3.2 mmol/L — ABNORMAL LOW (ref 3.5–5.1)
Sodium: 146 mmol/L — ABNORMAL HIGH (ref 135–145)
Total Bilirubin: 0.5 mg/dL (ref 0.3–1.2)
Total Protein: 5 g/dL — ABNORMAL LOW (ref 6.5–8.1)

## 2021-01-16 LAB — GLUCOSE, CAPILLARY
Glucose-Capillary: 101 mg/dL — ABNORMAL HIGH (ref 70–99)
Glucose-Capillary: 105 mg/dL — ABNORMAL HIGH (ref 70–99)
Glucose-Capillary: 107 mg/dL — ABNORMAL HIGH (ref 70–99)
Glucose-Capillary: 108 mg/dL — ABNORMAL HIGH (ref 70–99)

## 2021-01-16 LAB — MAGNESIUM: Magnesium: 1.8 mg/dL (ref 1.7–2.4)

## 2021-01-16 MED ORDER — POTASSIUM CHLORIDE 20 MEQ PO PACK
40.0000 meq | PACK | ORAL | Status: AC
  Administered 2021-01-16: 40 meq via ORAL
  Filled 2021-01-16: qty 2

## 2021-01-16 MED ORDER — POTASSIUM CHLORIDE CRYS ER 20 MEQ PO TBCR
40.0000 meq | EXTENDED_RELEASE_TABLET | Freq: Once | ORAL | Status: DC
  Filled 2021-01-16: qty 2

## 2021-01-16 MED ORDER — ENOXAPARIN SODIUM 40 MG/0.4ML IJ SOSY
40.0000 mg | PREFILLED_SYRINGE | Freq: Every day | INTRAMUSCULAR | Status: DC
  Administered 2021-01-16: 40 mg via SUBCUTANEOUS
  Filled 2021-01-16: qty 0.4

## 2021-01-16 MED ORDER — SODIUM CHLORIDE 0.45 % IV SOLN: INTRAVENOUS | Status: DC

## 2021-01-16 MED ORDER — POTASSIUM CHLORIDE 20 MEQ PO PACK
40.0000 meq | PACK | Freq: Once | ORAL | Status: AC
  Administered 2021-01-16: 40 meq via ORAL
  Filled 2021-01-16: qty 2

## 2021-01-16 NOTE — Progress Notes (Signed)
PROGRESS NOTE    Victoria Levine  JYN:[id number] DOB: 1950-11-20 DOA: 01/13/2021 PCP: [name], MD   Brief Narrative:  70 year old with history of GERD, asthma, hypothyroidism, alcohol use, bipolar, osteoporosis, anxiety comes to the hospital with complaints of weakness and confusion.  Apparently patient was not seen by her neighbor for several days therefore cousin went to check up on her and was noted to be confused therefore EMS was called and she was brought to the hospital.  Upon arrival she was noted to be dehydrated with evidence of mild rhabdomyolysis and acute kidney injury.  Chest x-ray, CT abdomen pelvis was unremarkable.  Right upper quadrant showed some hepatic steatosis.  CT head was negative.  She was started on broad-spectrum antibiotics, which was eventually stopped. AKI improved with IVF.  PT recommended SNF.   Assessment & Plan:   Principal Problem:   Acute kidney injury ([place]) Active Problems:   Hypokalemia   Acute metabolic encephalopathy   Lactic acidosis   Sepsis ([facility])   Hypercalcemia   Leukocytosis   Thrombocytosis   Dehydration   Transaminitis   Acute kidney injury - Suspect prerenal from dehydration.  Baseline creatinine 0.8.  Admission creatinine 3.86 -> 3.14 >2.11 > 1.34 - Advised nursing staff to remove Foley catheter today.  Sodium and chloride slightly trending upwards therefore will transition to half-normal saline for 24 hours.  SIRS Leukocytosis, improving -no obvious evidence of infection at this time.  Procalcitonin 0.2, UA is unremarkable, chest x-ray is negative.  Cultures remain negative  Hypokalemia - Repletion as necessary   Lactic acidosis/ Dehydration.  Increased anion gap/hyperglycemia - Resolved with IV fluids  Acute metabolic encephalopathy -Resolved.  CT head is negative-likely from dehydration.  Continue to monitor.  Hypercalcemia Resolved with IVF  Transaminitis possibly due to shock liver/biliary sludge AST  148, ALT 374, ALP 136 -Trend LFTs for now.  Right upper quadrant ultrasound shows some hepatic steatosis  Thrombocytosis possibly reactive -From dehydration.  Resolved    ??  Hyperthyroidism TSH was 15.713, T4 > 5.5 No clinical signs of hyperthyroidism Consider repeatin-elevated TSH and T4.  Repeat labs outpatient g this in a nonacute state  PT/OT-SNF, TOC consulted    DVT prophylaxis: enoxaparin (LOVENOX) injection 30 mg Start: 01/13/21 2330 SCDs Start: 01/13/21 2321 Code Status: DNR - ptn confirmed.  Family Communication:    Status is: Inpatient  Remains inpatient appropriate because: TOC working on placement  Nutritional status  Nutrition Problem: Increased nutrient needs Etiology: acute illness  Signs/Symptoms: estimated needs  Interventions: Ensure Enlive (each supplement provides 350kcal and 20 grams of protein)  Body mass index is 25.28 kg/m.  Pressure Injury 03/09/20 Sacrum Posterior Stage 1 -  Intact skin with non-blanchable redness of a localized area usually over a bony prominence. dressing from nursing home in place on the sacrum, but to the right stage 1 noted.  (Active)  03/09/20 1600  Location: Sacrum  Location Orientation: Posterior  Staging: Stage 1 -  Intact skin with non-blanchable redness of a localized area usually over a bony prominence.  Wound Description (Comments): dressing from nursing home in place on the sacrum, but to the right stage 1 noted.   Present on Admission: Yes          Subjective: feeling okay no complaints.  Had brief episode of nausea this morning.  Had 1 episode of diarrhea yesterday.  Review of Systems Otherwise negative except as per HPI, including: General = no fevers, chills, dizziness,  fatigue HEENT/EYES =  negative for loss of vision, double vision, blurred vision,  sore throa Cardiovascular= negative for chest pain, palpitation Respiratory/lungs= negative for shortness of breath, cough, wheezing; hemoptysis,   Gastrointestinal= negative for nausea, vomiting, abdominal pain Genitourinary= negative for Dysuria MSK = Negative for arthralgia, myalgias Neurology= Negative for headache, numbness, tingling  Psychiatry= Negative for suicidal and homocidal ideation Skin= Negative for Rash  Examination: Constitutional: Not in acute distress Respiratory: Clear to auscultation bilaterally Cardiovascular: Normal sinus rhythm, no rubs Abdomen: Nontender nondistended good bowel sounds Musculoskeletal: No edema noted Skin: Some bruising around her left eye and left arm Neurologic: CN 2-12 grossly intact.  And nonfocal Psychiatric: Normal judgment and insight. Alert and oriented x 3. Normal mood.    Objective: Vitals:   01/15/21 0921 01/15/21 2118 01/16/21 0511 01/16/21 0919  BP: (!) 150/83 (!) 164/89 (!) 157/80 (!) 155/96  Pulse: 61 60 70 61  Resp: 18 17 17 18   Temp: 98.4 F (36.9 C) 97.9 F (36.6 C) 98.5 F (36.9 C) 98 F (36.7 C)  TempSrc:  Oral    SpO2: 97% 99% 98% 99%  Weight:      Height:        Intake/Output Summary (Last 24 hours) at 01/16/2021 1149 Last data filed at 01/16/2021 0600 Gross per 24 hour  Intake 2033.51 ml  Output 1000 ml  Net 1033.51 ml   Filed Weights   01/13/21 1420 01/13/21 2230  Weight: 72.6 kg 68.9 kg     Data Reviewed:   CBC: Recent Labs  Lab 01/13/21 1418 01/13/21 1702 01/14/21 0722 01/15/21 0806 01/16/21 0525  WBC 21.6*  --  15.2* 11.1* 10.6*  NEUTROABS 18.9*  --   --   --   --   HGB 19.3* 16.7* 14.7 11.6* 12.3  HCT 58.7* 49.0* 44.7 36.0 38.6  MCV 93.8  --  94.7 96.0 97.0  PLT 438*  --  285 207 875   Basic Metabolic Panel: Recent Labs  Lab 01/13/21 1418 01/13/21 1702 01/14/21 0722 01/14/21 1013 01/15/21 0806 01/15/21 0831 01/16/21 0525  NA 144 148*  --  145  --  145 146*  K 3.4* 3.3*  --  3.1*  --  3.2* 3.2*  CL 105  --   --  110  --  114* 116*  CO2 17*  --   --  18*  --  21* 24  GLUCOSE 221*  --   --  207*  --  111* 100*  BUN  169*  --   --  135*  --  87* 46*  CREATININE 3.86*  --   --  3.14*  --  2.11* 1.34*  CALCIUM 11.4*  --   --  9.6  --  9.2 9.1  MG  --   --  2.4  --  2.1  --  1.8  PHOS  --   --  6.2*  --   --   --   --    GFR: Estimated Creatinine Clearance: 37.6 mL/min (A) (by C-G formula based on SCr of 1.34 mg/dL (H)). Liver Function Tests: Recent Labs  Lab 01/13/21 1418 01/15/21 0831 01/16/21 0525  AST 148* 59* 35  ALT 374* 162* 117*  ALKPHOS 136* 69 77  BILITOT 2.5* 1.1 0.5  PROT 7.9 4.7* 5.0*  ALBUMIN 4.1 2.4* 2.3*   No results for input(s): LIPASE, AMYLASE in the last 168 hours. No results for input(s): AMMONIA in the last 168 hours. Coagulation Profile: Recent Labs  Lab 01/13/21 1418  01/13/21 1630 01/14/21 0722  INR 1.2 1.2 1.3*   Cardiac Enzymes: Recent Labs  Lab 01/13/21 1424 01/15/21 0806  CKTOTAL 29* 100   BNP (last 3 results) No results for input(s): PROBNP in the last 8760 hours. HbA1C: Recent Labs    01/14/21 0722  HGBA1C 5.6   CBG: Recent Labs  Lab 01/15/21 1140 01/15/21 1714 01/15/21 1715 01/16/21 0016 01/16/21 0651  GLUCAP 102* 240* 215* 107* 105*   Lipid Profile: No results for input(s): CHOL, HDL, LDLCALC, TRIG, CHOLHDL, LDLDIRECT in the last 72 hours. Thyroid Function Tests: Recent Labs    01/13/21 1424  TSH 15.713*  FREET4 >5.50*   Anemia Panel: No results for input(s): VITAMINB12, FOLATE, FERRITIN, TIBC, IRON, RETICCTPCT in the last 72 hours. Sepsis Labs: Recent Labs  Lab 01/13/21 1525 01/13/21 1715 01/14/21 0722 01/15/21 1031  PROCALCITON  --   --  0.20  --   LATICACIDVEN 4.5* 3.6* 3.1* 0.8    Recent Results (from the past 240 hour(s))  Resp Panel by RT-PCR (Flu A&B, Covid) Nasopharyngeal Swab     Status: None   Collection Time: 01/13/21  2:18 PM   Specimen: Nasopharyngeal Swab; Nasopharyngeal(NP) swabs in vial transport medium  Result Value Ref Range Status   SARS Coronavirus 2 by RT PCR NEGATIVE NEGATIVE Final    Comment:  (NOTE) SARS-CoV-2 target nucleic acids are NOT DETECTED.  The SARS-CoV-2 RNA is generally detectable in upper respiratory specimens during the acute phase of infection. The lowest concentration of SARS-CoV-2 viral copies this assay can detect is 138 copies/mL. A negative result does not preclude SARS-Cov-2 infection and should not be used as the sole basis for treatment or other patient management decisions. A negative result may occur with  improper specimen collection/handling, submission of specimen other than nasopharyngeal swab, presence of viral mutation(s) within the areas targeted by this assay, and inadequate number of viral copies(<138 copies/mL). A negative result must be combined with clinical observations, patient history, and epidemiological information. The expected result is Negative.  Fact Sheet for Patients:  EntrepreneurPulse.com.au  Fact Sheet for Healthcare Providers:  IncredibleEmployment.be  This test is no t yet approved or cleared by the [place] FDA and  has been authorized for detection and/or diagnosis of SARS-CoV-2 by FDA under an Emergency Use Authorization (EUA). This EUA will remain  in effect (meaning this test can be used) for the duration of the COVID-19 declaration under Section 564(b)(1) of the Act, 21 U.S.C.section 360bbb-3(b)(1), unless the authorization is terminated  or revoked sooner.       Influenza A by PCR NEGATIVE NEGATIVE Final   Influenza B by PCR NEGATIVE NEGATIVE Final    Comment: (NOTE) The Xpert Xpress SARS-CoV-2/FLU/RSV plus assay is intended as an aid in the diagnosis of influenza from Nasopharyngeal swab specimens and should not be used as a sole basis for treatment. Nasal washings and aspirates are unacceptable for Xpert Xpress SARS-CoV-2/FLU/RSV testing.  Fact Sheet for Patients: EntrepreneurPulse.com.au  Fact Sheet for Healthcare  Providers: IncredibleEmployment.be  This test is not yet approved or cleared by the [place] FDA and has been authorized for detection and/or diagnosis of SARS-CoV-2 by FDA under an Emergency Use Authorization (EUA). This EUA will remain in effect (meaning this test can be used) for the duration of the COVID-19 declaration under Section 564(b)(1) of the Act, 21 U.S.C. section 360bbb-3(b)(1), unless the authorization is terminated or revoked.  Performed at [facility], [place] [address] [place], [place] [address]  Blood Culture (routine x 2)     Status: None (Preliminary result)   Collection Time: 01/13/21  2:50 PM   Specimen: BLOOD  Result Value Ref Range Status   Specimen Description BLOOD LEFT ANTECUBITAL  Final   Special Requests   Final    BOTTLES DRAWN AEROBIC AND ANAEROBIC Blood Culture adequate volume   Culture   Final    NO GROWTH 3 DAYS Performed at [facility], [address], [place],  [place] [address]    Report Status PENDING  Incomplete  Urine Culture     Status: None   Collection Time: 01/13/21  5:29 PM   Specimen: In/Out Cath Urine  Result Value Ref Range Status   Specimen Description IN/OUT CATH URINE  Final   Special Requests NONE  Final   Culture   Final    NO GROWTH Performed at [facility], [place] [address] [place], [place] [address]    Report Status 01/14/2021 FINAL  Final  Blood Culture (routine x 2)     Status: None (Preliminary result)   Collection Time: 01/14/21  8:22 AM   Specimen: BLOOD  Result Value Ref Range Status   Specimen Description BLOOD LEFT ANTECUBITAL  Final   Special Requests   Final    BOTTLES DRAWN AEROBIC AND ANAEROBIC Blood Culture adequate volume   Culture   Final    NO GROWTH 2 DAYS Performed at [facility], [place] [address], [place], [place] [address]    Report Status PENDING  Incomplete         Radiology Studies: No results found.      Scheduled Meds:   Chlorhexidine Gluconate Cloth  6 each Topical Daily   enoxaparin (LOVENOX) injection  30 mg Subcutaneous QHS   feeding supplement  237 mL Oral TID BM   potassium chloride  40 mEq Oral Q4H   Continuous Infusions:  sodium chloride 75 mL/hr at 01/16/21 0842     LOS: 3 days   Time spent= 35 mins    Haddy Mullinax Arsenio Loader, MD [facility]  If 7PM-7AM, please contact night-coverage  01/16/2021, 11:49 AM

## 2021-01-16 NOTE — Care Management Important Message (Signed)
Important Message  Patient Details  Name: Victoria Levine MRN: [id number] Date of Birth: 04/08/1950   Medicare Important Message Given:  Yes     Orbie Pyo 01/16/2021, 2:20 PM

## 2021-01-16 NOTE — TOC Initial Note (Signed)
Transition of Care [facility]) - Initial/Assessment Note    Patient Details  Name: Victoria Levine MRN: [id number] Date of Birth: 21-Aug-1950  Transition of Care [facility]) CM/SW Contact:    Emeterio Reeve, LCSW Phone Number: 01/16/2021, 1:50 PM  Clinical Narrative:                  CSW received SNF consult. CSW met with pt at bedside. CSW introduced self and explained role at the hospital. Pt reports that PTA She lives at [facility], an independent living facility. Pt reports she was independent with mobility and ADL's.   CSW reviewed PT/OT recommendations for SNF. Pt reports that she has no interest in SNF and will return home to her cat. CSW explained the importance of discharging to  a safe environment. Pt states she has 6 neighbors/friends that will take turns making she she has everything she needs.  Pt stated she would be open to [facility] PT, no preference in facilities. CSW informed RNCM.   CSW will continue to follow.   Expected Discharge Plan: Skilled Nursing Facility Barriers to Discharge: Continued Medical Work up, Ship broker   Patient Goals and CMS Choice Patient states their goals for this hospitalization and ongoing recovery are:: To return home CMS Medicare.gov Compare Post Acute Care list provided to:: Patient Choice offered to / list presented to : Patient  Expected Discharge Plan and Services Expected Discharge Plan: [place]       Living arrangements for the past 2 months: [place]                                      Prior Living Arrangements/Services Living arrangements for the past 2 months: [place] Lives with:: Self   Do you feel safe going back to the place where you live?: Yes      Need for Family Participation in Patient Care: Yes (Comment) Care giver support system in place?: No (comment)      Activities of Daily Living Home Assistive Devices/Equipment: Walker (specify  type) (rollator) ADL Screening (condition at time of admission) Patient's cognitive ability adequate to safely complete daily activities?: Yes Is the patient deaf or have difficulty hearing?: No Does the patient have difficulty seeing, even when wearing glasses/contacts?: No Does the patient have difficulty concentrating, remembering, or making decisions?: No Patient able to express need for assistance with ADLs?: No Does the patient have difficulty dressing or bathing?: No Independently performs ADLs?: Yes (appropriate for developmental age) Does the patient have difficulty walking or climbing stairs?: No Weakness of Legs: Both Weakness of Arms/Hands: Both  Permission Sought/Granted   Permission granted to share information with : No              Emotional Assessment Appearance:: Appears stated age Attitude/Demeanor/Rapport: Engaged Affect (typically observed): Appropriate Orientation: : Oriented to Self, Oriented to Place, Oriented to  Time Alcohol / Substance Use: Not Applicable Psych Involvement: No (comment)  Admission diagnosis:  Acute kidney injury ([place]) [N17.9] Right upper quadrant abdominal pain [R10.11] Patient Active Problem List   Diagnosis Date Noted   Lactic acidosis 01/14/2021   Sepsis ([place]) 01/14/2021   Hypercalcemia 01/14/2021   Leukocytosis 01/14/2021   Thrombocytosis 01/14/2021   Dehydration 01/14/2021   Transaminitis 01/14/2021   Acute kidney injury ([place]) 01/13/2021   Encephalopathy 03/09/2020   Pressure injury of skin 14/97/0263   Acute metabolic  encephalopathy 02/27/2020   Displaced intertrochanteric fracture of left femur, initial encounter for closed fracture ([place]) 02/23/2020   Hypokalemia 02/23/2020   Intractable nausea and vomiting 08/31/2019   Epigastric pain 08/31/2019   Alcohol abuse 08/31/2019   Microcytic anemia 08/31/2019   Closed compression fracture of L2 vertebra ([place]) 12/11/2017   Pain 07/26/2015   Anxiety state 06/28/2015    Benzodiazepine dependence ([place]) 10/13/2014   Opioid abuse with opioid-induced mood disorder ([place]) 10/13/2014   Bipolar 1 disorder, depressed ([place]) 10/12/2014   Spondylolisthesis of lumbar region 07/28/2014   Osteoporosis 11/17/2013   Migraines    Herpes    Hypothyroidism 08/13/2008   HYPERCHOLESTEROLEMIA 08/13/2008   Bipolar disorder ([place]) 08/13/2008   Asthma-chronic obstructive pulmonary disease overlap syndrome ([place]) 08/13/2008   GERD (gastroesophageal reflux disease) 08/13/2008   HIATAL HERNIA 08/13/2008   ALLERGY 08/13/2008   PCP:  [name], MD Pharmacy:   [id number] [place] [facility] [address] [place]Phone: [phone]: [phone]    Social Determinants of Health (SDOH) Interventions    Readmission Risk Interventions Readmission Risk Prevention Plan 02/25/2020  Transportation Screening Complete  Medication Review (Press photographer Complete  PCP or Specialist appointment within 3-5 days of discharge Complete  SW Recovery Care/Counseling Consult Complete  [place]  Some recent data might be hidden    MEmeterio Reeve LCSW Clinical Social Worker

## 2021-01-17 ENCOUNTER — Observation Stay (HOSPITAL_COMMUNITY)
Admission: EM | Admit: 2021-01-17 | Discharge: 2021-01-27 | Disposition: A | Discharge status: Home / Self Care | Payer: Medicare HMO | Attending: Student | Admitting: Student

## 2021-01-17 ENCOUNTER — Encounter (HOSPITAL_COMMUNITY): Payer: Self-pay | Admitting: Emergency Medicine

## 2021-01-17 ENCOUNTER — Other Ambulatory Visit: Payer: Self-pay

## 2021-01-17 DIAGNOSIS — Z20822 Contact with and (suspected) exposure to covid-19: Secondary | ICD-10-CM | POA: Diagnosis not present

## 2021-01-17 DIAGNOSIS — R197 Diarrhea, unspecified: Principal | ICD-10-CM | POA: Diagnosis present

## 2021-01-17 DIAGNOSIS — R7401 Elevation of levels of liver transaminase levels: Secondary | ICD-10-CM | POA: Diagnosis present

## 2021-01-17 DIAGNOSIS — R531 Weakness: Secondary | ICD-10-CM

## 2021-01-17 DIAGNOSIS — D72829 Elevated white blood cell count, unspecified: Secondary | ICD-10-CM | POA: Diagnosis present

## 2021-01-17 DIAGNOSIS — E039 Hypothyroidism, unspecified: Secondary | ICD-10-CM | POA: Insufficient documentation

## 2021-01-17 DIAGNOSIS — R112 Nausea with vomiting, unspecified: Secondary | ICD-10-CM | POA: Insufficient documentation

## 2021-01-17 DIAGNOSIS — R609 Edema, unspecified: Secondary | ICD-10-CM

## 2021-01-17 DIAGNOSIS — G8929 Other chronic pain: Secondary | ICD-10-CM | POA: Diagnosis present

## 2021-01-17 DIAGNOSIS — J45909 Unspecified asthma, uncomplicated: Secondary | ICD-10-CM | POA: Diagnosis not present

## 2021-01-17 DIAGNOSIS — N179 Acute kidney failure, unspecified: Secondary | ICD-10-CM | POA: Diagnosis not present

## 2021-01-17 DIAGNOSIS — F319 Bipolar disorder, unspecified: Secondary | ICD-10-CM | POA: Diagnosis present

## 2021-01-17 DIAGNOSIS — R109 Unspecified abdominal pain: Secondary | ICD-10-CM

## 2021-01-17 DIAGNOSIS — E860 Dehydration: Secondary | ICD-10-CM

## 2021-01-17 DIAGNOSIS — Z96642 Presence of left artificial hip joint: Secondary | ICD-10-CM | POA: Insufficient documentation

## 2021-01-17 DIAGNOSIS — E876 Hypokalemia: Secondary | ICD-10-CM | POA: Insufficient documentation

## 2021-01-17 DIAGNOSIS — Z79899 Other long term (current) drug therapy: Secondary | ICD-10-CM | POA: Insufficient documentation

## 2021-01-17 LAB — COMPREHENSIVE METABOLIC PANEL
ALT: 82 U/L — ABNORMAL HIGH (ref 0–44)
AST: 30 U/L (ref 15–41)
Albumin: 2.3 g/dL — ABNORMAL LOW (ref 3.5–5.0)
Alkaline Phosphatase: 76 U/L (ref 38–126)
Anion gap: 8 (ref 5–15)
BUN: 21 mg/dL (ref 8–23)
CO2: 23 mmol/L (ref 22–32)
Calcium: 8.8 mg/dL — ABNORMAL LOW (ref 8.9–10.3)
Chloride: 109 mmol/L (ref 98–111)
Creatinine, Ser: 1.06 mg/dL — ABNORMAL HIGH (ref 0.44–1.00)
GFR, Estimated: 56 mL/min — ABNORMAL LOW (ref >60)
Glucose, Bld: 76 mg/dL (ref 70–99)
Potassium: 3 mmol/L — ABNORMAL LOW (ref 3.5–5.1)
Sodium: 140 mmol/L (ref 135–145)
Total Bilirubin: 0.6 mg/dL (ref 0.3–1.2)
Total Protein: 4.8 g/dL — ABNORMAL LOW (ref 6.5–8.1)

## 2021-01-17 LAB — CBC
HCT: 38.4 % (ref 36.0–46.0)
Hemoglobin: 12.4 g/dL (ref 12.0–15.0)
MCH: 31 pg (ref 26.0–34.0)
MCHC: 32.3 g/dL (ref 30.0–36.0)
MCV: 96 fL (ref 80.0–100.0)
Platelets: 234 10*3/uL (ref 150–400)
RBC: 4 MIL/uL (ref 3.87–5.11)
RDW: 13.9 % (ref 11.5–15.5)
WBC: 12.1 10*3/uL — ABNORMAL HIGH (ref 4.0–10.5)
nRBC: 0 % (ref 0.0–0.2)

## 2021-01-17 LAB — GLUCOSE, CAPILLARY
Glucose-Capillary: 74 mg/dL (ref 70–99)
Glucose-Capillary: 81 mg/dL (ref 70–99)
Glucose-Capillary: 88 mg/dL (ref 70–99)

## 2021-01-17 LAB — MAGNESIUM: Magnesium: 1.3 mg/dL — ABNORMAL LOW (ref 1.7–2.4)

## 2021-01-17 MED ORDER — POTASSIUM CHLORIDE 10 MEQ/100ML IV SOLN
10.0000 meq | INTRAVENOUS | Status: AC
  Administered 2021-01-17 (×4): 10 meq via INTRAVENOUS
  Filled 2021-01-17 (×4): qty 100

## 2021-01-17 MED ORDER — LOPERAMIDE HCL 2 MG PO CAPS: 4.0000 mg | ORAL_CAPSULE | ORAL | 0 refills | Status: DC | PRN

## 2021-01-17 MED ORDER — POTASSIUM CHLORIDE 20 MEQ PO PACK
40.0000 meq | PACK | Freq: Every day | ORAL | Status: DC
  Filled 2021-01-17: qty 2

## 2021-01-17 MED ORDER — LOPERAMIDE HCL 2 MG PO CAPS: 4.0000 mg | ORAL_CAPSULE | ORAL | Status: DC | PRN

## 2021-01-17 MED ORDER — MAGNESIUM SULFATE 4 GM/100ML IV SOLN
4.0000 g | Freq: Once | INTRAVENOUS | Status: AC
  Administered 2021-01-17: 4 g via INTRAVENOUS
  Filled 2021-01-17: qty 100

## 2021-01-17 MED ORDER — ONDANSETRON 4 MG PO TBDP
4.0000 mg | ORAL_TABLET | Freq: Once | ORAL | Status: AC
  Administered 2021-01-17: 4 mg via ORAL
  Filled 2021-01-17: qty 1

## 2021-01-17 NOTE — TOC Transition Note (Addendum)
Transition of Care [facility]) - CM/SW Discharge Note   Patient Details  Name: Victoria Levine MRN: [id number] Date of Birth: Jul 18, 1950  Transition of Care [facility]) CM/SW Contact:  Tom-Johnson, Renea Ee, RN Phone Number: 01/17/2021, 3:12 PM   Clinical Narrative:    CM spoke with patient at bedside about needs for post hospital transition. Patient is from home alone. Presented with confusion and weakness. States she has a daughter  who lives in [place] and does not want to bother her and three grand children. Has a brother and cousin who lives in [place] and very supportive. Independent with care and drives self prior to hospitalization. Retired Mining engineer. Ambulates with a rollator at home and has bedside commode. P[facility] is [name] [facility] [address]. CM spoke with patient about PT recommending SNF for rehab and patient declined. States she wants to go home to her own environment and do what she wants. CM asked patient if she could call her daughter or brother and patient states she does not want to bother daughter and not to call. Gave permission to call cousin, Lenis Noon for her house keys. CM called sarah [phone]) and Judson Roch states she will be at patient's house waiting for her. Request patient use [facility] transportation at discharge. SCM spoke with Jodell Cipro [phone]) and ordered bedside commode and light weight wheelchair. To be delivered at bedside. CM called [employer] and scheduled for 5:30 pick up time. Nurse notified. CM called [facility] and spoke with Erline Levine [phone]) and referral made for home health PT/OT. Information placed on chart. No further TOC needs noted.   Final next level of care: [place] Barriers to Discharge: No Barriers Identified   Patient Goals and CMS Choice Patient states their goals for this hospitalization and ongoing recovery are:: To go home CMS Medicare.gov Compare Post Acute  Care list provided to:: Patient Choice offered to / list presented to : Patient  Discharge Placement                       Discharge Plan and Services                DME Arranged: N/A DME Agency: NA       HH Arranged: PT, OT HH Agency: [place] Date [place]: 01/17/21 Time [place]: 5400 Representative spoke with at [place]: [place] ([place]) Interventions     Readmission Risk Interventions Readmission Risk Prevention Plan 02/25/2020  Transportation Screening Complete  Medication Review Press photographer) Complete  PCP or Specialist appointment within 3-5 days of discharge Complete  SW Recovery Care/Counseling Consult Complete  Skilled Nursing Facility Complete  Some recent data might be hidden

## 2021-01-17 NOTE — Progress Notes (Addendum)
PT Cancellation Note  Patient Details Name: Victoria Levine MRN: [id number] DOB: June 20, 1950   Cancelled Treatment:    Reason Eval/Treat Not Completed: Patient declined, no reason specified Pt refused PT after x 3 attempts. On first attempt, pt stating she had a BM and requesting NT clean her up. On second attempt, pt stating she was "out of sorts," and asked therapist to check back in. On third attempt, pt stating she could not mobilize to the edge of bed due to stomach pain. PT provided max encouragement to participate keeping in mind pt goal is to get home. Recommending 3 in 1 bedside commode and wheelchair and HHPT in light of pt refusal of SNF.  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  A walker alone will not resolve the issues with performing activities of daily living. A wheelchair will allow patient to safely perform daily activities.  The patient can self propel in the home or has a caregiver who can provide assistance.      Wyona Almas, PT, DPT Acute Rehabilitation Services Pager [phone] Office [phone]    Deno Etienne 01/17/2021, 11:55 AM

## 2021-01-17 NOTE — Discharge Summary (Signed)
Physician Discharge Summary  Victoria Levine YTK:[id number] DOB: 1951-03-12 DOA: 01/13/2021  PCP: [name], MD  Admit date: 01/13/2021 Discharge date: 01/17/2021  Admitted From: Home Disposition:  Home  Recommendations for Outpatient Follow-up:  Follow up with PCP in 1-2 weeks Please obtain CMP/CBC in one week your next doctors visit.     Discharge Condition: Stable CODE STATUS: DNR Diet recommendation: Heart healthy  Brief/Interim Summary: 70 year old with history of GERD, asthma, hypothyroidism, alcohol use, bipolar, osteoporosis, anxiety comes to the hospital with complaints of weakness and confusion.  Apparently patient was not seen by her neighbor for several days therefore cousin went to check up on her and was noted to be confused therefore EMS was called and she was brought to the hospital.  Upon arrival she was noted to be dehydrated with evidence of mild rhabdomyolysis and acute kidney injury.  Chest x-ray, CT abdomen pelvis was unremarkable.  Right upper quadrant showed some hepatic steatosis.  CT head was negative.  She was started on broad-spectrum antibiotics, which was eventually stopped. AKI improved with IVF.  PT recommended SNF but patient declined therefere will arrange for her to go home.      Assessment & Plan:   Principal Problem:   Acute kidney injury ([name]) Active Problems:   Hypokalemia   Acute metabolic encephalopathy   Lactic acidosis   Sepsis (HCC)   Hypercalcemia   Leukocytosis   Thrombocytosis   Dehydration   Transaminitis     Acute kidney injury, resolved - Suspect prerenal from dehydration.  Baseline creatinine 0.8.  Admission creatinine 3.86 -> 3.14 >2.11 > 1.34 > 1.06    SIRS, resolved Leukocytosis, improving -no obvious evidence of infection at this time.  Procalcitonin 0.2, UA is unremarkable, chest x-ray is negative.  Cultures remain negative   Hypokalemia/hypomagnesemia - Repletion prior to her discharge   Lactic  acidosis/ Dehydration.  Increased anion gap/hyperglycemia - Resolved with IV fluids  Acute metabolic encephalopathy -Resolved.  CT head is negative-likely from dehydration.  Continue to monitor.  Hypercalcemia Resolved with IVF  Transaminitis possibly due to shock liver/biliary sludge AST 148, ALT 374, ALP 136 - Improved, repeat outpatient.  Right upper quadrant ultrasound shows some hepatic steatosis  Thrombocytosis possibly reactive -From dehydration.  Resolved     ??  Hyperthyroidism TSH was 15.713, T4 > 5.5 No clinical signs of hyperthyroidism Consider repeatin-elevated TSH and T4.  Repeat labs outpatient g this in a nonacute state   PT/OT-SNF, TOC consulted.  Patient is declining SNF placement therefore we will transition her home with home health  Body mass index is 25.28 kg/m.  Pressure Injury 03/09/20 Sacrum Posterior Stage 1 -  Intact skin with non-blanchable redness of a localized area usually over a bony prominence. dressing from nursing home in place on the sacrum, but to the right stage 1 noted.  (Active)  03/09/20 1600  Location: Sacrum  Location Orientation: Posterior  Staging: Stage 1 -  Intact skin with non-blanchable redness of a localized area usually over a bony prominence.  Wound Description (Comments): dressing from nursing home in place on the sacrum, but to the right stage 1 noted.   Present on Admission: Yes        Discharge Diagnoses:  Principal Problem:   Acute kidney injury ([name]) Active Problems:   Hypokalemia   Acute metabolic encephalopathy   Lactic acidosis   Sepsis (HCC)   Hypercalcemia   Leukocytosis   Thrombocytosis   Dehydration   Transaminitis    Subjective:  Patient is declining to go to SNF therefore we will arrange for home with home health.  She is aware of this.  Denies any other complaints at this time.  Discharge Exam: Vitals:   01/17/21 0922 01/17/21 1157  BP: (!) 179/92 (!) 162/89  Pulse: (!) 59   Resp: 18    Temp: 98.6 F (37 C)   SpO2: 97%    Vitals:   01/16/21 2041 01/17/21 0601 01/17/21 0922 01/17/21 1157  BP: (!) 168/109 (!) 160/105 (!) 179/92 (!) 162/89  Pulse: 67 64 (!) 59   Resp: 18 18 18    Temp: 98.1 F (36.7 C) 98.2 F (36.8 C) 98.6 F (37 C)   TempSrc: Oral Oral    SpO2: 100% 97% 97%   Weight:      Height:        General: Pt is alert, awake, not in acute distress Cardiovascular: RRR, S1/S2 +, no rubs, no gallops Respiratory: CTA bilaterally, no wheezing, no rhonchi Abdominal: Soft, NT, ND, bowel sounds + Extremities: no edema, no cyanosis  Discharge Instructions   Allergies as of 01/17/2021       Reactions   Prednisone Other (See Comments)   Cannot tolerate ORALLY, but can tolerate by shot (interacts with anxiety & bipolarity)   Lithium Carbonate Other (See Comments)   Patient was taken off of this because of a weight gain of 40 pounds   Other Other (See Comments)   Serious reaction to 1st COVID vaccination - unknown type   Sulfa Antibiotics Other (See Comments)   From childhood; reaction not recalled        Medication List     TAKE these medications    albuterol 108 (90 Base) MCG/ACT inhaler Commonly known as: VENTOLIN HFA Inhale 2 puffs into the lungs every 6 (six) hours as needed for wheezing or shortness of breath.   albuterol (2.5 MG/3ML) 0.083% nebulizer solution Commonly known as: PROVENTIL Take 2.5 mg by nebulization every 6 (six) hours as needed for wheezing or shortness of breath.   budesonide 0.25 MG/2ML nebulizer solution Commonly known as: PULMICORT Take 2 mLs (0.25 mg total) by nebulization 2 (two) times daily.   buPROPion 150 MG 24 hr tablet Commonly known as: WELLBUTRIN XL Take 150 mg by mouth 2 (two) times daily.   EPINEPHrine 0.3 mg/0.3 mL Soaj injection Commonly known as: EPI-PEN Inject 0.3 mg into the skin daily as needed for anaphylaxis.   famotidine 20 MG tablet Commonly known as: PEPCID Take 1 tablet (20 mg total) by  mouth at bedtime.   FLUoxetine 20 MG capsule Commonly known as: PROZAC Take 20 mg by mouth 2 (two) times daily.   gabapentin 600 MG tablet Commonly known as: NEURONTIN Take 600 mg by mouth 3 (three) times daily as needed (pain).   levothyroxine 112 MCG tablet Commonly known as: SYNTHROID Take 112 mcg by mouth daily before breakfast.   multivitamin with minerals Tabs tablet Take 1 tablet by mouth daily.   naproxen sodium 220 MG tablet Commonly known as: ALEVE Take 440 mg by mouth every 12 (twelve) hours as needed (pain).   oxcarbazepine 600 MG tablet Commonly known as: TRILEPTAL Take 600 mg by mouth 2 (two) times daily.   Oxycodone HCl 10 MG Tabs Take 1 tablet (10 mg total) by mouth every 4 (four) hours as needed (for breakthrough pain). What changed:  when to take this reasons to take this   QUEtiapine 400 MG tablet Commonly known as: SEROQUEL Take 400 mg  by mouth every evening.   rizatriptan 10 MG disintegrating tablet Commonly known as: MAXALT-MLT Take 10 mg by mouth daily as needed for migraine.   Xtampza ER 9 MG C12a Generic drug: oxyCODONE ER Take 9 mg by mouth 2 (two) times daily.        Follow-up Information     [name], MD Follow up in 1 week(s).   Specialty: Family Medicine Contact information: [address] [place] [address] [phone]                Allergies  Allergen Reactions   Prednisone Other (See Comments)    Cannot tolerate ORALLY, but can tolerate by shot (interacts with anxiety & bipolarity)   Lithium Carbonate Other (See Comments)    Patient was taken off of this because of a weight gain of 40 pounds   Other Other (See Comments)    Serious reaction to 1st COVID vaccination - unknown type   Sulfa Antibiotics Other (See Comments)    From childhood; reaction not recalled    You were cared for by a hospitalist during your hospital stay. If you have any questions about your discharge  medications or the care you received while you were in the hospital after you are discharged, you can call the unit and asked to speak with the hospitalist on call if the hospitalist that took care of you is not available. Once you are discharged, your primary care physician will handle any further medical issues. Please note that no refills for any discharge medications will be authorized once you are discharged, as it is imperative that you return to your primary care physician (or establish a relationship with a primary care physician if you do not have one) for your aftercare needs so that they can reassess your need for medications and monitor your lab values.   Procedures/Studies: CT ABDOMEN PELVIS WO CONTRAST  Result Date: 01/13/2021 CLINICAL DATA:  Sepsis EXAM: CT ABDOMEN AND PELVIS WITHOUT CONTRAST TECHNIQUE: Multidetector CT imaging of the abdomen and pelvis was performed following the standard protocol without IV contrast. COMPARISON:  Virtual colonoscopy dated 11/17/2019 FINDINGS: Lower chest: Although not ordered, almost the entire chest is within the field of view. No evidence of pneumonia. Mild compressive atelectasis in the medial right middle lobe. No pleural effusion or pneumothorax. Large fat containing anterior hernia along the right anterior aspect of the heart. The heart is normal in size. No pericardial effusion. Mild atherosclerotic calcifications of the arch. Hepatobiliary: Unenhanced liver is unremarkable. Gallbladder is unremarkable. No intrahepatic or extrahepatic ductal dilatation. Pancreas: Within normal limits. Spleen: Within normal limits. Adrenals/Urinary Tract: Adrenal glands are within normal limits. Kidneys are within normal limits. No renal calculi or hydronephrosis. Bladder decompressed by an indwelling Foley catheter with trace gas. Stomach/Bowel: Stomach is notable for a moderate hiatal hernia/intrathoracic stomach. No evidence of bowel obstruction. Normal appendix  (series 8/image 4). Mild sigmoid diverticulosis, without evidence of diverticulitis. Vascular/Lymphatic: No evidence of abdominal aortic aneurysm. Atherosclerotic calcifications of the abdominal aorta and branch vessels. No suspicious abdominopelvic lymphadenopathy. Reproductive: Uterus is within normal limits. Bilateral ovaries are unremarkable. Other: No abdominopelvic ascites. Musculoskeletal: Severe compression fracture deformity at T3 with mild retropulsion, age indeterminate. Mild superior endplate compression fracture deformity at T8, likely chronic. Prior vertebral augmentation at T12 and L2. Status post PLIF at L3-5. Degenerative changes of the lumbar spine. Left hip arthroplasty. IMPRESSION: No evidence of bowel obstruction. Normal appendix. Mild sigmoid diverticulosis, without evidence of diverticulitis. Bladder  decompressed by an indwelling Foley catheter. No evidence of pneumonia. Moderate hiatal hernia.  Large anterior hernia with fat. Severe compression fracture deformity at T3 with mild retropulsion, age indeterminate. Electronically Signed   By: Julian Hy M.D.   On: 01/13/2021 20:07   CT Head Wo Contrast  Result Date: 01/13/2021 CLINICAL DATA:  Head trauma EXAM: CT HEAD WITHOUT CONTRAST TECHNIQUE: Contiguous axial images were obtained from the base of the skull through the vertex without intravenous contrast. COMPARISON:  CT brain 01/04/2021, 03/02/2020, 02/23/2020 FINDINGS: Brain: No acute territorial infarction, hemorrhage or intracranial mass is visualized. Atrophy and mild chronic small vessel ischemic changes of the white matter. Small chronic infarcts in the cerebellum. Stable left posterior parafalcine round calcification likely calcified meningioma. Stable ventricle size. Vascular: No hyperdense vessels.  Carotid vascular calcification Skull: No fracture. Stable 11 mm intra osseous ground-glass lesion within the left frontal bone, felt benign. Sinuses/Orbits: No acute finding.  Other: None IMPRESSION: 1. No CT evidence for acute intracranial abnormality. 2. Atrophy and mild chronic small vessel ischemic changes of the white matter Electronically Signed   By: Donavan Foil M.D.   On: 01/13/2021 16:12   CT HEAD WO CONTRAST (5MM)  Result Date: 01/04/2021 CLINICAL DATA:  [place] 2 days ago with trauma to the head and face. EXAM: CT HEAD WITHOUT CONTRAST TECHNIQUE: Contiguous axial images were obtained from the base of the skull through the vertex without intravenous contrast. COMPARISON:  03/09/2020 FINDINGS: Brain: Mild age related volume loss. No evidence of old or acute focal infarction, mass lesion, hemorrhage, hydrocephalus or extra-axial collection. Vascular: There is atherosclerotic calcification of the major vessels at the base of the brain. Skull: Negative Sinuses/Orbits: Clear/normal Other: None IMPRESSION: No acute or traumatic finding.  Mild age related volume loss. Electronically Signed   By: Nelson Chimes M.D.   On: 01/04/2021 14:44   DG Chest Port 1 View  Result Date: 01/13/2021 CLINICAL DATA:  Sepsis EXAM: PORTABLE CHEST 1 VIEW COMPARISON:  03/09/2020 FINDINGS: Single frontal view of the chest demonstrates a stable cardiac silhouette. Stable hiatal hernia and prominent epicardial fat. No acute airspace disease, effusion, or pneumothorax. No acute bony abnormalities. IMPRESSION: 1. Stable chest, no acute intrathoracic process. Electronically Signed   By: Randa Ngo M.D.   On: 01/13/2021 16:00   CT Maxillofacial Wo Contrast  Result Date: 01/04/2021 CLINICAL DATA:  [place] 2 days ago with facial injury and bruising. EXAM: CT MAXILLOFACIAL WITHOUT CONTRAST TECHNIQUE: Multidetector CT imaging of the maxillofacial structures was performed. Multiplanar CT image reconstructions were also generated. COMPARISON:  None. FINDINGS: Osseous: No facial fracture or focal bone lesion. Orbits: No evidence of orbital injury. Sinuses: Sinuses are clear. No traumatic fluid or  inflammatory changes. Soft tissues: No soft tissue finding of note by CT. Limited intracranial: Normal IMPRESSION: Negative CT scan of the face. No evidence of fracture or orbital injury. Electronically Signed   By: Nelson Chimes M.D.   On: 01/04/2021 14:43   US Abdomen Limited RUQ (LIVER/GB)  Result Date: 01/13/2021 CLINICAL DATA:  Right upper quadrant abdominal pain. EXAM: ULTRASOUND ABDOMEN LIMITED RIGHT UPPER QUADRANT COMPARISON:  Abdominal ultrasound and CT 08/31/2019 FINDINGS: Gallbladder: Physiologically distended. Layering intraluminal sludge. Gallstones on prior ultrasound are not well seen on the current exam. No gallbladder wall thickening. No sonographic Murphy sign noted by sonographer. Common bile duct: Diameter: 6 mm. Liver: Heterogeneous increased parenchymal echogenicity. The liver is difficult to penetrate. Allowing for this, no focal lesion. Technically limited assessment due to altered  mental status and difficulty with breath hold. Portal vein is patent on color Doppler imaging with normal direction of blood flow towards the liver. Other: No right upper quadrant ascites. IMPRESSION: 1. Gallbladder sludge. Gallstones on prior ultrasound are not well seen on the current exam. No sonographic findings of acute cholecystitis. 2. No biliary dilatation. 3. Suggestion of hepatic steatosis. Electronically Signed   By: Keith Rake M.D.   On: 01/13/2021 18:10     The results of significant diagnostics from this hospitalization (including imaging, microbiology, ancillary and laboratory) are listed below for reference.     Microbiology: Recent Results (from the past 240 hour(s))  Resp Panel by RT-PCR (Flu A&B, Covid) Nasopharyngeal Swab     Status: None   Collection Time: 01/13/21  2:18 PM   Specimen: Nasopharyngeal Swab; Nasopharyngeal(NP) swabs in vial transport medium  Result Value Ref Range Status   SARS Coronavirus 2 by RT PCR NEGATIVE NEGATIVE Final    Comment: (NOTE) SARS-CoV-2  target nucleic acids are NOT DETECTED.  The SARS-CoV-2 RNA is generally detectable in upper respiratory specimens during the acute phase of infection. The lowest concentration of SARS-CoV-2 viral copies this assay can detect is 138 copies/mL. A negative result does not preclude SARS-Cov-2 infection and should not be used as the sole basis for treatment or other patient management decisions. A negative result may occur with  improper specimen collection/handling, submission of specimen other than nasopharyngeal swab, presence of viral mutation(s) within the areas targeted by this assay, and inadequate number of viral copies(<138 copies/mL). A negative result must be combined with clinical observations, patient history, and epidemiological information. The expected result is Negative.  Fact Sheet for Patients:  EntrepreneurPulse.com.au  Fact Sheet for Healthcare Providers:  IncredibleEmployment.be  This test is no t yet approved or cleared by the [place] FDA and  has been authorized for detection and/or diagnosis of SARS-CoV-2 by FDA under an Emergency Use Authorization (EUA). This EUA will remain  in effect (meaning this test can be used) for the duration of the COVID-19 declaration under Section 564(b)(1) of the Act, 21 U.S.C.section 360bbb-3(b)(1), unless the authorization is terminated  or revoked sooner.       Influenza A by PCR NEGATIVE NEGATIVE Final   Influenza B by PCR NEGATIVE NEGATIVE Final    Comment: (NOTE) The Xpert Xpress SARS-CoV-2/FLU/RSV plus assay is intended as an aid in the diagnosis of influenza from Nasopharyngeal swab specimens and should not be used as a sole basis for treatment. Nasal washings and aspirates are unacceptable for Xpert Xpress SARS-CoV-2/FLU/RSV testing.  Fact Sheet for Patients: EntrepreneurPulse.com.au  Fact Sheet for Healthcare  Providers: IncredibleEmployment.be  This test is not yet approved or cleared by the [place] FDA and has been authorized for detection and/or diagnosis of SARS-CoV-2 by FDA under an Emergency Use Authorization (EUA). This EUA will remain in effect (meaning this test can be used) for the duration of the COVID-19 declaration under Section 564(b)(1) of the Act, 21 U.S.C. section 360bbb-3(b)(1), unless the authorization is terminated or revoked.  Performed at [facility], [place] [address], [place], [place] [address]   Blood Culture (routine x 2)     Status: None (Preliminary result)   Collection Time: 01/13/21  2:50 PM   Specimen: BLOOD  Result Value Ref Range Status   Specimen Description BLOOD LEFT ANTECUBITAL  Final   Special Requests   Final    BOTTLES DRAWN AEROBIC AND ANAEROBIC Blood Culture adequate volume   Culture  Final    NO GROWTH 4 DAYS Performed at [facility], [place] [address] [place], [place] [address]    Report Status PENDING  Incomplete  Urine Culture     Status: None   Collection Time: 01/13/21  5:29 PM   Specimen: In/Out Cath Urine  Result Value Ref Range Status   Specimen Description IN/OUT CATH URINE  Final   Special Requests NONE  Final   Culture   Final    NO GROWTH Performed at [facility], [name] [address] [place], [place] [address]    Report Status 01/14/2021 FINAL  Final  Blood Culture (routine x 2)     Status: None (Preliminary result)   Collection Time: 01/14/21  8:22 AM   Specimen: BLOOD  Result Value Ref Range Status   Specimen Description BLOOD LEFT ANTECUBITAL  Final   Special Requests   Final    BOTTLES DRAWN AEROBIC AND ANAEROBIC Blood Culture adequate volume   Culture   Final    NO GROWTH 3 DAYS Performed at [facility], [place] [address], [place], [place] [address]    Report Status PENDING  Incomplete     Labs: BNP (last 3 results) No results for input(s): BNP in the last 8760  hours. Basic Metabolic Panel: Recent Labs  Lab 01/13/21 1418 01/13/21 1702 01/14/21 0722 01/14/21 1013 01/15/21 0806 01/15/21 0831 01/16/21 0525 01/17/21 0501  NA 144 148*  --  145  --  145 146* 140  K 3.4* 3.3*  --  3.1*  --  3.2* 3.2* 3.0*  CL 105  --   --  110  --  114* 116* 109  CO2 17*  --   --  18*  --  21* 24 23  GLUCOSE 221*  --   --  207*  --  111* 100* 76  BUN 169*  --   --  135*  --  87* 46* 21  CREATININE 3.86*  --   --  3.14*  --  2.11* 1.34* 1.06*  CALCIUM 11.4*  --   --  9.6  --  9.2 9.1 8.8*  MG  --   --  2.4  --  2.1  --  1.8 1.3*  PHOS  --   --  6.2*  --   --   --   --   --    Liver Function Tests: Recent Labs  Lab 01/13/21 1418 01/15/21 0831 01/16/21 0525 01/17/21 0501  AST 148* 59* 35 30  ALT 374* 162* 117* 82*  ALKPHOS 136* 69 77 76  BILITOT 2.5* 1.1 0.5 0.6  PROT 7.9 4.7* 5.0* 4.8*  ALBUMIN 4.1 2.4* 2.3* 2.3*   No results for input(s): LIPASE, AMYLASE in the last 168 hours. No results for input(s): AMMONIA in the last 168 hours. CBC: Recent Labs  Lab 01/13/21 1418 01/13/21 1702 01/14/21 0722 01/15/21 0806 01/16/21 0525 01/17/21 0501  WBC 21.6*  --  15.2* 11.1* 10.6* 12.1*  NEUTROABS 18.9*  --   --   --   --   --   HGB 19.3* 16.7* 14.7 11.6* 12.3 12.4  HCT 58.7* 49.0* 44.7 36.0 38.6 38.4  MCV 93.8  --  94.7 96.0 97.0 96.0  PLT 438*  --  285 207 223 234   Cardiac Enzymes: Recent Labs  Lab 01/13/21 1424 01/15/21 0806  CKTOTAL 29* 100   BNP: Invalid input(s): POCBNP CBG: Recent Labs  Lab 01/16/21 1136 01/16/21 2135 01/17/21 0007  01/17/21 0558 01/17/21 1134  GLUCAP 101* 108* 88 74 81   D-Dimer No results for input(s): DDIMER in the last 72 hours. Hgb A1c No results for input(s): HGBA1C in the last 72 hours. Lipid Profile No results for input(s): CHOL, HDL, LDLCALC, TRIG, CHOLHDL, LDLDIRECT in the last 72 hours. Thyroid function studies No results for input(s): TSH, T4TOTAL, T3FREE, THYROIDAB in the last 72  hours.  Invalid input(s): FREET3 Anemia work up No results for input(s): VITAMINB12, FOLATE, FERRITIN, TIBC, IRON, RETICCTPCT in the last 72 hours. Urinalysis    Component Value Date/Time   COLORURINE AMBER (A) 01/13/2021 1729   APPEARANCEUR HAZY (A) 01/13/2021 1729   LABSPEC 1.015 01/13/2021 1729   PHURINE 5.0 01/13/2021 1729   GLUCOSEU NEGATIVE 01/13/2021 1729   HGBUR NEGATIVE 01/13/2021 1729   BILIRUBINUR NEGATIVE 01/13/2021 1729   KETONESUR NEGATIVE 01/13/2021 1729   PROTEINUR NEGATIVE 01/13/2021 1729   UROBILINOGEN 0.2 11/12/2013 2233   NITRITE NEGATIVE 01/13/2021 1729   LEUKOCYTESUR NEGATIVE 01/13/2021 1729   Sepsis Labs Invalid input(s): PROCALCITONIN,  WBC,  LACTICIDVEN Microbiology Recent Results (from the past 240 hour(s))  Resp Panel by RT-PCR (Flu A&B, Covid) Nasopharyngeal Swab     Status: None   Collection Time: 01/13/21  2:18 PM   Specimen: Nasopharyngeal Swab; Nasopharyngeal(NP) swabs in vial transport medium  Result Value Ref Range Status   SARS Coronavirus 2 by RT PCR NEGATIVE NEGATIVE Final    Comment: (NOTE) SARS-CoV-2 target nucleic acids are NOT DETECTED.  The SARS-CoV-2 RNA is generally detectable in upper respiratory specimens during the acute phase of infection. The lowest concentration of SARS-CoV-2 viral copies this assay can detect is 138 copies/mL. A negative result does not preclude SARS-Cov-2 infection and should not be used as the sole basis for treatment or other patient management decisions. A negative result may occur with  improper specimen collection/handling, submission of specimen other than nasopharyngeal swab, presence of viral mutation(s) within the areas targeted by this assay, and inadequate number of viral copies(<138 copies/mL). A negative result must be combined with clinical observations, patient history, and epidemiological information. The expected result is Negative.  Fact Sheet for Patients:   EntrepreneurPulse.com.au  Fact Sheet for Healthcare Providers:  IncredibleEmployment.be  This test is no t yet approved or cleared by the [place] FDA and  has been authorized for detection and/or diagnosis of SARS-CoV-2 by FDA under an Emergency Use Authorization (EUA). This EUA will remain  in effect (meaning this test can be used) for the duration of the COVID-19 declaration under Section 564(b)(1) of the Act, 21 U.S.C.section 360bbb-3(b)(1), unless the authorization is terminated  or revoked sooner.       Influenza A by PCR NEGATIVE NEGATIVE Final   Influenza B by PCR NEGATIVE NEGATIVE Final    Comment: (NOTE) The Xpert Xpress SARS-CoV-2/FLU/RSV plus assay is intended as an aid in the diagnosis of influenza from Nasopharyngeal swab specimens and should not be used as a sole basis for treatment. Nasal washings and aspirates are unacceptable for Xpert Xpress SARS-CoV-2/FLU/RSV testing.  Fact Sheet for Patients: EntrepreneurPulse.com.au  Fact Sheet for Healthcare Providers: IncredibleEmployment.be  This test is not yet approved or cleared by the [place] FDA and has been authorized for detection and/or diagnosis of SARS-CoV-2 by FDA under an Emergency Use Authorization (EUA). This EUA will remain in effect (meaning this test can be used) for the duration of the COVID-19 declaration under Section 564(b)(1) of the Act, 21 U.S.C. section 360bbb-3(b)(1), unless the authorization is  terminated or revoked.  Performed at [facility], [place] [address], [place], [place] [address]   Blood Culture (routine x 2)     Status: None (Preliminary result)   Collection Time: 01/13/21  2:50 PM   Specimen: BLOOD  Result Value Ref Range Status   Specimen Description BLOOD LEFT ANTECUBITAL  Final   Special Requests   Final    BOTTLES DRAWN AEROBIC AND ANAEROBIC Blood Culture adequate volume   Culture    Final    NO GROWTH 4 DAYS Performed at [facility], [place] [address] [place], [place] [address]    Report Status PENDING  Incomplete  Urine Culture     Status: None   Collection Time: 01/13/21  5:29 PM   Specimen: In/Out Cath Urine  Result Value Ref Range Status   Specimen Description IN/OUT CATH URINE  Final   Special Requests NONE  Final   Culture   Final    NO GROWTH Performed at [facility], [place] [address], [place], [place] [address]    Report Status 01/14/2021 FINAL  Final  Blood Culture (routine x 2)     Status: None (Preliminary result)   Collection Time: 01/14/21  8:22 AM   Specimen: BLOOD  Result Value Ref Range Status   Specimen Description BLOOD LEFT ANTECUBITAL  Final   Special Requests   Final    BOTTLES DRAWN AEROBIC AND ANAEROBIC Blood Culture adequate volume   Culture   Final    NO GROWTH 3 DAYS Performed at [facility], [place] [address] [place],  [address]    Report Status PENDING  Incomplete     Time coordinating discharge:  I have spent 35 minutes face to face with the patient and on the ward discussing the patients care, assessment, plan and disposition with other care givers. >50% of the time was devoted counseling the patient about the risks and benefits of treatment/Discharge disposition and coordinating care.   SIGNED:   Damita Lack, MD  [facility] 01/17/2021, 12:14 PM   If 7PM-7AM, please contact night-coverage

## 2021-01-17 NOTE — ED Provider Notes (Signed)
Emergency Medicine Provider Triage Evaluation Note  Victoria Levine , a 70 y.o. female  was evaluated in triage.  Pt complains of nausea, vomiting, and diarrhea.  Just released from the hospital today after having been admitted for AKI.  States that upon getting home she continued to have vomiting and diarrhea.  States that she doesn't have the strength to go home..  Review of Systems  Positive: Fatigue, nausea, diarrhea Negative: Fever, chills  Physical Exam  BP (!) 155/107   Pulse 78   Temp 98.6 F (37 C) (Oral)   Resp 12   SpO2 98%  Gen:   Awake, no distress   Resp:  Normal effort  MSK:   Moves extremities without difficulty  Other:    Medical Decision Making  Medically screening exam initiated at 11:36 PM.  Appropriate orders placed.  Victoria Levine was informed that the remainder of the evaluation will be completed by another provider, this initial triage assessment does not replace that evaluation, and the importance of remaining in the ED until their evaluation is complete.     Montine Circle, PA-C 01/17/21 [id number]    Lucrezia Starch, MD 01/20/21 [phone]

## 2021-01-17 NOTE — ED Triage Notes (Signed)
Per EMS, pt from home c/o "same symptoms I left the hospital today with."  Pt c/o diarrhea and "I'm so weak I can't take care of myself."

## 2021-01-18 ENCOUNTER — Observation Stay (HOSPITAL_COMMUNITY): Payer: Medicare HMO

## 2021-01-18 ENCOUNTER — Encounter (HOSPITAL_COMMUNITY): Payer: Self-pay | Admitting: Emergency Medicine

## 2021-01-18 DIAGNOSIS — E038 Other specified hypothyroidism: Secondary | ICD-10-CM

## 2021-01-18 DIAGNOSIS — R7401 Elevation of levels of liver transaminase levels: Secondary | ICD-10-CM | POA: Diagnosis not present

## 2021-01-18 DIAGNOSIS — R197 Diarrhea, unspecified: Secondary | ICD-10-CM | POA: Diagnosis not present

## 2021-01-18 DIAGNOSIS — R531 Weakness: Secondary | ICD-10-CM | POA: Diagnosis not present

## 2021-01-18 DIAGNOSIS — M7989 Other specified soft tissue disorders: Secondary | ICD-10-CM | POA: Diagnosis not present

## 2021-01-18 DIAGNOSIS — F319 Bipolar disorder, unspecified: Secondary | ICD-10-CM | POA: Diagnosis not present

## 2021-01-18 DIAGNOSIS — D72829 Elevated white blood cell count, unspecified: Secondary | ICD-10-CM | POA: Diagnosis not present

## 2021-01-18 DIAGNOSIS — E876 Hypokalemia: Secondary | ICD-10-CM

## 2021-01-18 DIAGNOSIS — G8929 Other chronic pain: Secondary | ICD-10-CM

## 2021-01-18 LAB — CBC WITH DIFFERENTIAL/PLATELET
Abs Immature Granulocytes: 0.33 10*3/uL — ABNORMAL HIGH (ref 0.00–0.07)
Basophils Absolute: 0 10*3/uL (ref 0.0–0.1)
Basophils Relative: 0 %
Eosinophils Absolute: 0.2 10*3/uL (ref 0.0–0.5)
Eosinophils Relative: 1 %
HCT: 42.6 % (ref 36.0–46.0)
Hemoglobin: 14.2 g/dL (ref 12.0–15.0)
Immature Granulocytes: 2 %
Lymphocytes Relative: 8 %
Lymphs Abs: 1.2 10*3/uL (ref 0.7–4.0)
MCH: 31.3 pg (ref 26.0–34.0)
MCHC: 33.3 g/dL (ref 30.0–36.0)
MCV: 94 fL (ref 80.0–100.0)
Monocytes Absolute: 1 10*3/uL (ref 0.1–1.0)
Monocytes Relative: 7 %
Neutro Abs: 11.5 10*3/uL — ABNORMAL HIGH (ref 1.7–7.7)
Neutrophils Relative %: 82 %
Platelets: 253 10*3/uL (ref 150–400)
RBC: 4.53 MIL/uL (ref 3.87–5.11)
RDW: 13.5 % (ref 11.5–15.5)
WBC: 14.2 10*3/uL — ABNORMAL HIGH (ref 4.0–10.5)
nRBC: 0 % (ref 0.0–0.2)

## 2021-01-18 LAB — BASIC METABOLIC PANEL
Anion gap: 9 (ref 5–15)
BUN: 13 mg/dL (ref 8–23)
CO2: 24 mmol/L (ref 22–32)
Calcium: 8.7 mg/dL — ABNORMAL LOW (ref 8.9–10.3)
Chloride: 103 mmol/L (ref 98–111)
Creatinine, Ser: 0.88 mg/dL (ref 0.44–1.00)
GFR, Estimated: >60 mL/min (ref >60)
Glucose, Bld: 72 mg/dL (ref 70–99)
Potassium: 3.5 mmol/L (ref 3.5–5.1)
Sodium: 136 mmol/L (ref 135–145)

## 2021-01-18 LAB — COMPREHENSIVE METABOLIC PANEL
ALT: 76 U/L — ABNORMAL HIGH (ref 0–44)
AST: 33 U/L (ref 15–41)
Albumin: 2.6 g/dL — ABNORMAL LOW (ref 3.5–5.0)
Alkaline Phosphatase: 82 U/L (ref 38–126)
Anion gap: 11 (ref 5–15)
BUN: 14 mg/dL (ref 8–23)
CO2: 22 mmol/L (ref 22–32)
Calcium: 8.9 mg/dL (ref 8.9–10.3)
Chloride: 102 mmol/L (ref 98–111)
Creatinine, Ser: 0.9 mg/dL (ref 0.44–1.00)
GFR, Estimated: >60 mL/min (ref >60)
Glucose, Bld: 84 mg/dL (ref 70–99)
Potassium: 2.9 mmol/L — ABNORMAL LOW (ref 3.5–5.1)
Sodium: 135 mmol/L (ref 135–145)
Total Bilirubin: 0.8 mg/dL (ref 0.3–1.2)
Total Protein: 5.3 g/dL — ABNORMAL LOW (ref 6.5–8.1)

## 2021-01-18 LAB — C DIFFICILE QUICK SCREEN W PCR REFLEX
C Diff antigen: NEGATIVE
C Diff interpretation: NOT DETECTED
C Diff toxin: NEGATIVE

## 2021-01-18 LAB — T4, FREE: Free T4: 0.81 ng/dL (ref 0.61–1.12)

## 2021-01-18 LAB — CULTURE, BLOOD (ROUTINE X 2)
Culture: NO GROWTH
Special Requests: ADEQUATE

## 2021-01-18 LAB — RESP PANEL BY RT-PCR (FLU A&B, COVID) ARPGX2
Influenza A by PCR: NEGATIVE
Influenza B by PCR: NEGATIVE
SARS Coronavirus 2 by RT PCR: NEGATIVE

## 2021-01-18 LAB — MAGNESIUM: Magnesium: 1.9 mg/dL (ref 1.7–2.4)

## 2021-01-18 LAB — TSH: TSH: 12.822 u[IU]/mL — ABNORMAL HIGH (ref 0.350–4.500)

## 2021-01-18 MED ORDER — POTASSIUM CHLORIDE 10 MEQ/100ML IV SOLN
10.0000 meq | INTRAVENOUS | Status: AC
  Administered 2021-01-18 (×5): 10 meq via INTRAVENOUS
  Filled 2021-01-18 (×5): qty 100

## 2021-01-18 MED ORDER — ONDANSETRON HCL 4 MG/2ML IJ SOLN
4.0000 mg | Freq: Four times a day (QID) | INTRAMUSCULAR | Status: DC | PRN
  Filled 2021-01-18: qty 2

## 2021-01-18 MED ORDER — ACETAMINOPHEN 325 MG PO TABS
650.0000 mg | ORAL_TABLET | Freq: Four times a day (QID) | ORAL | Status: DC | PRN
  Administered 2021-01-19 – 2021-01-26 (×2): 650 mg via ORAL
  Filled 2021-01-18 (×2): qty 2

## 2021-01-18 MED ORDER — ACETAMINOPHEN 650 MG RE SUPP: 650.0000 mg | Freq: Four times a day (QID) | RECTAL | Status: DC | PRN

## 2021-01-18 MED ORDER — GABAPENTIN 300 MG PO CAPS: 600.0000 mg | ORAL_CAPSULE | Freq: Three times a day (TID) | ORAL | Status: DC | PRN

## 2021-01-18 MED ORDER — SODIUM CHLORIDE 0.9 % IV SOLN: Freq: Once | INTRAVENOUS | Status: AC

## 2021-01-18 MED ORDER — OXYCODONE HCL ER 10 MG PO T12A
10.0000 mg | EXTENDED_RELEASE_TABLET | Freq: Two times a day (BID) | ORAL | Status: DC
  Administered 2021-01-18 – 2021-01-27 (×19): 10 mg via ORAL
  Filled 2021-01-18 (×20): qty 1

## 2021-01-18 MED ORDER — QUETIAPINE FUMARATE 50 MG PO TABS
400.0000 mg | ORAL_TABLET | Freq: Every evening | ORAL | Status: DC
  Administered 2021-01-19 – 2021-01-21 (×3): 400 mg via ORAL
  Filled 2021-01-18: qty 1
  Filled 2021-01-18 (×4): qty 8

## 2021-01-18 MED ORDER — ONDANSETRON HCL 4 MG PO TABS
4.0000 mg | ORAL_TABLET | Freq: Four times a day (QID) | ORAL | Status: DC | PRN
  Administered 2021-01-21 – 2021-01-26 (×2): 4 mg via ORAL
  Filled 2021-01-18 (×2): qty 1

## 2021-01-18 MED ORDER — FENTANYL CITRATE PF 50 MCG/ML IJ SOSY
25.0000 ug | PREFILLED_SYRINGE | Freq: Once | INTRAMUSCULAR | Status: AC
  Administered 2021-01-18: 25 ug via INTRAVENOUS
  Filled 2021-01-18: qty 1

## 2021-01-18 MED ORDER — OXYCODONE HCL 5 MG PO TABS
10.0000 mg | ORAL_TABLET | Freq: Two times a day (BID) | ORAL | Status: DC | PRN
  Administered 2021-01-23: 10 mg via ORAL
  Filled 2021-01-18: qty 2

## 2021-01-18 MED ORDER — BUPROPION HCL ER (XL) 150 MG PO TB24
150.0000 mg | ORAL_TABLET | Freq: Two times a day (BID) | ORAL | Status: DC
  Administered 2021-01-18 – 2021-01-27 (×19): 150 mg via ORAL
  Filled 2021-01-18 (×20): qty 1

## 2021-01-18 MED ORDER — LEVOTHYROXINE SODIUM 112 MCG PO TABS
112.0000 ug | ORAL_TABLET | Freq: Every day | ORAL | Status: DC
  Administered 2021-01-19 – 2021-01-21 (×3): 112 ug via ORAL
  Filled 2021-01-18 (×3): qty 1

## 2021-01-18 MED ORDER — ONDANSETRON HCL 4 MG/2ML IJ SOLN
4.0000 mg | Freq: Once | INTRAMUSCULAR | Status: AC
  Administered 2021-01-18: 4 mg via INTRAVENOUS
  Filled 2021-01-18: qty 2

## 2021-01-18 MED ORDER — FAMOTIDINE 20 MG PO TABS
20.0000 mg | ORAL_TABLET | Freq: Every day | ORAL | Status: DC
  Administered 2021-01-18 – 2021-01-26 (×9): 20 mg via ORAL
  Filled 2021-01-18 (×9): qty 1

## 2021-01-18 MED ORDER — OXCARBAZEPINE 300 MG PO TABS
600.0000 mg | ORAL_TABLET | Freq: Two times a day (BID) | ORAL | Status: DC
  Administered 2021-01-18 – 2021-01-27 (×19): 600 mg via ORAL
  Filled 2021-01-18 (×22): qty 2

## 2021-01-18 MED ORDER — POTASSIUM CHLORIDE IN NACL 20-0.45 MEQ/L-% IV SOLN
INTRAVENOUS | Status: AC
  Filled 2021-01-18 (×3): qty 1000

## 2021-01-18 MED ORDER — FENTANYL CITRATE PF 50 MCG/ML IJ SOSY: 25.0000 ug | PREFILLED_SYRINGE | INTRAMUSCULAR | Status: DC | PRN

## 2021-01-18 MED ORDER — SUMATRIPTAN SUCCINATE 25 MG PO TABS
25.0000 mg | ORAL_TABLET | Freq: Every day | ORAL | Status: DC | PRN
  Filled 2021-01-18: qty 1

## 2021-01-18 MED ORDER — ALBUTEROL SULFATE (2.5 MG/3ML) 0.083% IN NEBU: 2.5000 mg | INHALATION_SOLUTION | Freq: Four times a day (QID) | RESPIRATORY_TRACT | Status: DC | PRN

## 2021-01-18 MED ORDER — LOPERAMIDE HCL 2 MG PO CAPS: 2.0000 mg | ORAL_CAPSULE | ORAL | Status: DC | PRN

## 2021-01-18 MED ORDER — MAGNESIUM SULFATE 2 GM/50ML IV SOLN
2.0000 g | Freq: Once | INTRAVENOUS | Status: AC
  Administered 2021-01-18: 2 g via INTRAVENOUS
  Filled 2021-01-18: qty 50

## 2021-01-18 MED ORDER — ENOXAPARIN SODIUM 40 MG/0.4ML IJ SOSY
40.0000 mg | PREFILLED_SYRINGE | INTRAMUSCULAR | Status: DC
  Administered 2021-01-18 – 2021-01-27 (×10): 40 mg via SUBCUTANEOUS
  Filled 2021-01-18 (×10): qty 0.4

## 2021-01-18 MED ORDER — FLUOXETINE HCL 20 MG PO CAPS
20.0000 mg | ORAL_CAPSULE | Freq: Two times a day (BID) | ORAL | Status: DC
  Administered 2021-01-18 – 2021-01-27 (×19): 20 mg via ORAL
  Filled 2021-01-18 (×20): qty 1

## 2021-01-18 MED ORDER — HYDRALAZINE HCL 20 MG/ML IJ SOLN
10.0000 mg | INTRAMUSCULAR | Status: DC | PRN
  Filled 2021-01-18: qty 1

## 2021-01-18 MED ORDER — SODIUM CHLORIDE 0.9% FLUSH
3.0000 mL | Freq: Two times a day (BID) | INTRAVENOUS | Status: DC
  Administered 2021-01-18 – 2021-01-19 (×2): 3 mL via INTRAVENOUS

## 2021-01-18 MED ORDER — SODIUM CHLORIDE 0.9 % IV SOLN: INTRAVENOUS | Status: DC

## 2021-01-18 NOTE — ED Notes (Signed)
Pandora Mccrackin brother [phone] requesting an update on the patient

## 2021-01-18 NOTE — ED Notes (Signed)
Pt incont of urine. Patient brief changed and patient provided clean dry blankets

## 2021-01-18 NOTE — H&P (Addendum)
History and Physical    Victoria Levine KDX:[id number] DOB: May 18, 1950 DOA: 01/17/2021  Referring MD/NP/PA: Shela Leff, MD PCP: [name], MD  Patient coming from: Home via EMS  Chief Complaint: Diarrhea and weakness  I have personally briefly reviewed patient's old medical records in [place]   HPI: Victoria Levine is a 70 y.o. female with medical history significant of hypothyroidism, asthma, GERD, osteoporosis, anxiety, and alcohol use [place] with complaints of diarrhea and weakness. Patient had just been hospitalized from 10/28-11/1 for weakness and confusion.  She initially met SIRS criteria and had been treated with empiric antibiotics, but antibiotics were discontinued as no source could be found.  Patient was noted to have multiple electrolyte abnormalities in addition to suspect prerenal acute kidney injury secondary to dehydration.  She was treated with IV fluids and electrolytes replaced with improvement.  Labs were also significant for TSH 15.713 and free T4 > 5.5. PT/OT evaluated patient and recommended skilled nursing facility placement.  However, patient reported feeling stronger than she actually was.  After getting home she was unable to get out of bed due to being so weak.  Complained of having multiple episodes of diarrhea in addition to crampy lower abdominal pain.  Notes that diarrhea was present prior to leaving the hospital but not as severe.  Associated symptoms included nausea, vomiting, and malaise.   ED Course: Upon admission into the emergency department patient was afebrile with blood pressures 147/80 3-1 90/106, and all other vital signs maintained.  Labs significant for WBC 14.2 potassium 2.9, albumin 2.6, and ALT 76.  Influenza and COVID-19 screening were negative.  Patient had been ordered potassium chloride 50 mEq IV, Zofran, magnesium sulfate  2 g IV, and placed on normal saline IV fluids at 125 mL/h.  Review of Systems  Constitutional:   Positive for malaise/fatigue. Negative for fever.  HENT:  Negative for congestion and nosebleeds.   Eyes:  Negative for photophobia and pain.  Respiratory:  Negative for cough and shortness of breath.   Cardiovascular:  Negative for chest pain and leg swelling.  Gastrointestinal:  Positive for abdominal pain, diarrhea, nausea and vomiting.  Genitourinary:  Negative for dysuria and hematuria.  Musculoskeletal:  Negative for falls and myalgias.  Neurological:  Positive for weakness. Negative for loss of consciousness.  Psychiatric/Behavioral:  Negative for substance abuse.    Past Medical History:  Diagnosis Date   Anemia    low iron   Anxiety    Asthma    Bipolar disorder (HCC)    Chronic back pain    Compression fracture of L2 lumbar vertebra (HCC)    DDD (degenerative disc disease)    Depression    Dyspnea    GERD (gastroesophageal reflux disease)    H/O alcohol abuse    28 years sober   H/O suicide attempt    Herpes    History of hiatal hernia    Hypothyroidism    Migraines    Osteoporosis 11/2018   T score of -2.6   Pneumonia    PONV (postoperative nausea and vomiting)    S/P adenoidectomy    S/P tonsillectomy     Past Surgical History:  Procedure Laterality Date   CESAREAN SECTION     COLONOSCOPY     as a teenager   Compression fracture spine     DILATION AND CURETTAGE OF UTERUS     EYE SURGERY Bilateral    CATARACTS REMOVED, NO LENS PLACED   HYSTEROSCOPY  KYPHOPLASTY N/A 12/19/2016   Procedure: THORACIC 12 KYPHOPLASTY; REQUEST 60 MINS AND FLIP ROOM;  Surgeon: Phylliss Bob, MD;  Location: [facility];  Service: Orthopedics;  Laterality: N/A;  THORACIC 12 KYPHOPLASTY; REQUEST 60 MINS AND FLIP ROOM   KYPHOPLASTY N/A 12/11/2017   Procedure: LUMBAR 2 KYPHOPLASTY;  Surgeon: Phylliss Bob, MD;  Location: [facility];  Service: Orthopedics;  Laterality: N/A;   LUMBAR FUSION     TONSILLECTOMY     TOTAL HIP ARTHROPLASTY Left 02/24/2020   Procedure: TOTAL HIP ARTHROPLASTY  ANTERIOR APPROACH;  Surgeon: Rod Can, MD;  Location: [facility];  Service: Orthopedics;  Laterality: Left;     reports that she has never smoked. She has never used smokeless tobacco. She reports current alcohol use. She reports that she does not currently use drugs after having used the following drugs: Benzodiazepines and Cocaine.  Allergies  Allergen Reactions   Prednisone Other (See Comments)    Cannot tolerate ORALLY, but can tolerate by shot (interacts with anxiety & bipolarity)   Lithium Carbonate Other (See Comments)    Patient was taken off of this because of a weight gain of 40 pounds   Other Other (See Comments)    Serious reaction to 1st COVID vaccination - unknown type   Sulfa Antibiotics Other (See Comments)    From childhood; reaction not recalled    Family History  Adopted: Yes    Prior to Admission medications   Medication Sig Start Date End Date Taking? Authorizing Provider  albuterol (PROVENTIL HFA;VENTOLIN HFA) 108 (90 Base) MCG/ACT inhaler Inhale 2 puffs into the lungs every 6 (six) hours as needed for wheezing or shortness of breath.   Yes [provider]  albuterol (PROVENTIL) (2.5 MG/3ML) 0.083% nebulizer solution Take 2.5 mg by nebulization every 6 (six) hours as needed for wheezing or shortness of breath.   Yes [provider]  buPROPion (WELLBUTRIN XL) 150 MG 24 hr tablet Take 150 mg by mouth 2 (two) times daily. 10/01/16  Yes [provider]  EPINEPHrine 0.3 mg/0.3 mL IJ SOAJ injection Inject 0.3 mg into the skin daily as needed for anaphylaxis.  04/10/19  Yes [provider]  famotidine (PEPCID) 20 MG tablet Take 1 tablet (20 mg total) by mouth at bedtime. 09/03/19 03/18/21 Yes Amin, Jeanella Flattery, MD  FLUoxetine (PROZAC) 20 MG capsule Take 20 mg by mouth 2 (two) times daily.    Yes [provider]  gabapentin (NEURONTIN) 600 MG tablet Take 600 mg by mouth 3 (three) times daily as needed (pain). 01/09/21  Yes  [provider]  levothyroxine (SYNTHROID, LEVOTHROID) 112 MCG tablet Take 112 mcg by mouth daily before breakfast.  09/04/17  Yes [provider]  loperamide (IMODIUM) 2 MG capsule Take 2 capsules (4 mg total) by mouth as needed for diarrhea or loose stools. 01/17/21  Yes Amin, Jeanella Flattery, MD  Multiple Vitamin (MULTIVITAMIN WITH MINERALS) TABS tablet Take 1 tablet by mouth daily.   Yes [provider]  naproxen sodium (ALEVE) 220 MG tablet Take 440 mg by mouth every 12 (twelve) hours as needed (pain).   Yes [provider]  oxcarbazepine (TRILEPTAL) 600 MG tablet Take 600 mg by mouth 2 (two) times daily.   Yes [provider]  oxyCODONE ER (XTAMPZA ER) 9 MG C12A Take 9 mg by mouth 2 (two) times daily.   Yes [provider]  Oxycodone HCl 10 MG TABS Take 1 tablet (10 mg total) by mouth every 4 (four) hours as  needed (for breakthrough pain). Patient taking differently: Take 10 mg by mouth 2 (two) times daily as needed (pain). 02/26/20  Yes Cherlynn June B, PA  QUEtiapine (SEROQUEL) 400 MG tablet Take 400 mg by mouth every evening. 11/12/20  Yes [provider]  rizatriptan (MAXALT-MLT) 10 MG disintegrating tablet Take 10 mg by mouth daily as needed for migraine. 11/08/14  Yes [provider]  budesonide (PULMICORT) 0.25 MG/2ML nebulizer solution Take 2 mLs (0.25 mg total) by nebulization 2 (two) times daily. Patient not taking: No sig reported 03/02/20   Lorella Nimrod, MD    Physical Exam:  Constitutional: Elderly female who appears ill, but in no acute distress. Vitals:   01/18/21 0253 01/18/21 0526 01/18/21 0600 01/18/21 0813  BP: (!) 147/83 (!) 190/106 (!) 185/100 (!) 182/102  Pulse: 82 87  79  Resp: '18 15 16 17  ' Temp: 98.1 F (36.7 C)   97.8 F (36.6 C)  TempSrc: Oral   Oral  SpO2: 98% 99% 98% 97%   Eyes: PERRL, lids and conjunctivae normal ENMT: Mucous membranes are dry. Posterior pharynx clear of any exudate or  lesions.   Neck: normal, supple, no masses, no thyromegaly Respiratory: clear to auscultation bilaterally, no wheezing, no crackles. Normal respiratory effort. No accessory muscle use.  Cardiovascular: Regular rate and rhythm, no murmurs / rubs / gallops. No extremity edema. 2+ pedal pulses. No carotid bruits.  Abdomen: Tenderness palpation of the mid to lower abdomen.  Normal bowel sounds. Musculoskeletal: no clubbing / cyanosis. No joint deformity upper and lower extremities. Good ROM, no contractures. Normal muscle tone.  Skin: Bruising noted underneath the right Neurologic: CN 2-12 grossly intact.  Strength 4/5 in all 4 Psychiatric: Normal judgment and insight. Alert and oriented x 3. Normal mood.     Labs on Admission: I have personally reviewed following labs and imaging studies  CBC: Recent Labs  Lab 01/13/21 1418 01/13/21 1702 01/14/21 0722 01/15/21 0806 01/16/21 0525 01/17/21 0501 01/17/21 2353  WBC 21.6*  --  15.2* 11.1* 10.6* 12.1* 14.2*  NEUTROABS 18.9*  --   --   --   --   --  11.5*  HGB 19.3*   < > 14.7 11.6* 12.3 12.4 14.2  HCT 58.7*   < > 44.7 36.0 38.6 38.4 42.6  MCV 93.8  --  94.7 96.0 97.0 96.0 94.0  PLT 438*  --  285 207 223 234 253   < > = values in this interval not displayed.   Basic Metabolic Panel: Recent Labs  Lab 01/14/21 0722 01/14/21 1013 01/15/21 0806 01/15/21 0831 01/16/21 0525 01/17/21 0501 01/17/21 2353  NA  --  145  --  145 146* 140 135  K  --  3.1*  --  3.2* 3.2* 3.0* 2.9*  CL  --  110  --  114* 116* 109 102  CO2  --  18*  --  21* '24 23 22  ' GLUCOSE  --  207*  --  111* 100* 76 84  BUN  --  135*  --  87* 46* 21 14  CREATININE  --  3.14*  --  2.11* 1.34* 1.06* 0.90  CALCIUM  --  9.6  --  9.2 9.1 8.8* 8.9  MG 2.4  --  2.1  --  1.8 1.3*  --   PHOS 6.2*  --   --   --   --   --   --    GFR: Estimated Creatinine Clearance: 55.9 mL/min (by C-G  formula based on SCr of 0.9 mg/dL). Liver Function Tests: Recent Labs  Lab 01/13/21 1418  01/15/21 0831 01/16/21 0525 01/17/21 0501 01/17/21 2353  AST 148* 59* 35 30 33  ALT 374* 162* 117* 82* 76*  ALKPHOS 136* 69 77 76 82  BILITOT 2.5* 1.1 0.5 0.6 0.8  PROT 7.9 4.7* 5.0* 4.8* 5.3*  ALBUMIN 4.1 2.4* 2.3* 2.3* 2.6*   No results for input(s): LIPASE, AMYLASE in the last 168 hours. No results for input(s): AMMONIA in the last 168 hours. Coagulation Profile: Recent Labs  Lab 01/13/21 1418 01/13/21 1630 01/14/21 0722  INR 1.2 1.2 1.3*   Cardiac Enzymes: Recent Labs  Lab 01/13/21 1424 01/15/21 0806  CKTOTAL 29* 100   BNP (last 3 results) No results for input(s): PROBNP in the last 8760 hours. HbA1C: No results for input(s): HGBA1C in the last 72 hours. CBG: Recent Labs  Lab 01/16/21 1136 01/16/21 2135 01/17/21 0007 01/17/21 0558 01/17/21 1134  GLUCAP 101* 108* 88 74 81   Lipid Profile: No results for input(s): CHOL, HDL, LDLCALC, TRIG, CHOLHDL, LDLDIRECT in the last 72 hours. Thyroid Function Tests: No results for input(s): TSH, T4TOTAL, FREET4, T3FREE, THYROIDAB in the last 72 hours. Anemia Panel: No results for input(s): VITAMINB12, FOLATE, FERRITIN, TIBC, IRON, RETICCTPCT in the last 72 hours. Urine analysis:    Component Value Date/Time   COLORURINE AMBER (A) 01/13/2021 1729   APPEARANCEUR HAZY (A) 01/13/2021 1729   LABSPEC 1.015 01/13/2021 1729   PHURINE 5.0 01/13/2021 1729   GLUCOSEU NEGATIVE 01/13/2021 1729   HGBUR NEGATIVE 01/13/2021 1729   BILIRUBINUR NEGATIVE 01/13/2021 1729   KETONESUR NEGATIVE 01/13/2021 1729   PROTEINUR NEGATIVE 01/13/2021 1729   UROBILINOGEN 0.2 11/12/2013 2233   NITRITE NEGATIVE 01/13/2021 1729   LEUKOCYTESUR NEGATIVE 01/13/2021 1729   Sepsis Labs: Recent Results (from the past 240 hour(s))  Resp Panel by RT-PCR (Flu A&B, Covid) Nasopharyngeal Swab     Status: None   Collection Time: 01/13/21  2:18 PM   Specimen: Nasopharyngeal Swab; Nasopharyngeal(NP) swabs in vial transport medium  Result Value Ref Range  Status   SARS Coronavirus 2 by RT PCR NEGATIVE NEGATIVE Final    Comment: (NOTE) SARS-CoV-2 target nucleic acids are NOT DETECTED.  The SARS-CoV-2 RNA is generally detectable in upper respiratory specimens during the acute phase of infection. The lowest concentration of SARS-CoV-2 viral copies this assay can detect is 138 copies/mL. A negative result does not preclude SARS-Cov-2 infection and should not be used as the sole basis for treatment or other patient management decisions. A negative result may occur with  improper specimen collection/handling, submission of specimen other than nasopharyngeal swab, presence of viral mutation(s) within the areas targeted by this assay, and inadequate number of viral copies(<138 copies/mL). A negative result must be combined with clinical observations, patient history, and epidemiological information. The expected result is Negative.  Fact Sheet for Patients:  EntrepreneurPulse.com.au  Fact Sheet for Healthcare Providers:  IncredibleEmployment.be  This test is no t yet approved or cleared by the [place] FDA and  has been authorized for detection and/or diagnosis of SARS-CoV-2 by FDA under an Emergency Use Authorization (EUA). This EUA will remain  in effect (meaning this test can be used) for the duration of the COVID-19 declaration under Section 564(b)(1) of the Act, 21 U.S.C.section 360bbb-3(b)(1), unless the authorization is terminated  or revoked sooner.       Influenza A by PCR NEGATIVE NEGATIVE Final   Influenza B by PCR NEGATIVE NEGATIVE  Final    Comment: (NOTE) The Xpert Xpress SARS-CoV-2/FLU/RSV plus assay is intended as an aid in the diagnosis of influenza from Nasopharyngeal swab specimens and should not be used as a sole basis for treatment. Nasal washings and aspirates are unacceptable for Xpert Xpress SARS-CoV-2/FLU/RSV testing.  Fact Sheet for  Patients: EntrepreneurPulse.com.au  Fact Sheet for Healthcare Providers: IncredibleEmployment.be  This test is not yet approved or cleared by the [place] FDA and has been authorized for detection and/or diagnosis of SARS-CoV-2 by FDA under an Emergency Use Authorization (EUA). This EUA will remain in effect (meaning this test can be used) for the duration of the COVID-19 declaration under Section 564(b)(1) of the Act, 21 U.S.C. section 360bbb-3(b)(1), unless the authorization is terminated or revoked.  Performed at [facility], [place] [address] [place], [place] [address]   Blood Culture (routine x 2)     Status: None   Collection Time: 01/13/21  2:50 PM   Specimen: BLOOD  Result Value Ref Range Status   Specimen Description BLOOD LEFT ANTECUBITAL  Final   Special Requests   Final    BOTTLES DRAWN AEROBIC AND ANAEROBIC Blood Culture adequate volume   Culture   Final    NO GROWTH 5 DAYS Performed at [facility], [place] [address] [place], [place] [address]    Report Status 01/18/2021 FINAL  Final  Urine Culture     Status: None   Collection Time: 01/13/21  5:29 PM   Specimen: In/Out Cath Urine  Result Value Ref Range Status   Specimen Description IN/OUT CATH URINE  Final   Special Requests NONE  Final   Culture   Final    NO GROWTH Performed at [facility], [place] [address] [place], [place] [address]    Report Status 01/14/2021 FINAL  Final  Blood Culture (routine x 2)     Status: None (Preliminary result)   Collection Time: 01/14/21  8:22 AM   Specimen: BLOOD  Result Value Ref Range Status   Specimen Description BLOOD LEFT ANTECUBITAL  Final   Special Requests   Final    BOTTLES DRAWN AEROBIC AND ANAEROBIC Blood Culture adequate volume   Culture   Final    NO GROWTH 4 DAYS Performed at [facility], [place] [address], [place], [place] [address]    Report Status PENDING  Incomplete  Resp Panel by RT-PCR  (Flu A&B, Covid) Nasopharyngeal Swab     Status: None   Collection Time: 01/18/21  6:33 AM   Specimen: Nasopharyngeal Swab; Nasopharyngeal(NP) swabs in vial transport medium  Result Value Ref Range Status   SARS Coronavirus 2 by RT PCR NEGATIVE NEGATIVE Final    Comment: (NOTE) SARS-CoV-2 target nucleic acids are NOT DETECTED.  The SARS-CoV-2 RNA is generally detectable in upper respiratory specimens during the acute phase of infection. The lowest concentration of SARS-CoV-2 viral copies this assay can detect is 138 copies/mL. A negative result does not preclude SARS-Cov-2 infection and should not be used as the sole basis for treatment or other patient management decisions. A negative result may occur with  improper specimen collection/handling, submission of specimen other than nasopharyngeal swab, presence of viral mutation(s) within the areas targeted by this assay, and inadequate number of viral copies(<138 copies/mL). A negative result must be combined with clinical observations, patient history, and epidemiological information. The expected result is Negative.  Fact Sheet for Patients:  EntrepreneurPulse.com.au  Fact Sheet for Healthcare Providers:  IncredibleEmployment.be  This test is no t yet approved or cleared by the [place] and  has been authorized for detection and/or diagnosis of SARS-CoV-2 by FDA under an Emergency Use Authorization (EUA). This EUA will remain  in effect (meaning this test can be used) for the duration of the COVID-19 declaration under Section 564(b)(1) of the Act, 21 U.S.C.section 360bbb-3(b)(1), unless the authorization is terminated  or revoked sooner.       Influenza A by PCR NEGATIVE NEGATIVE Final   Influenza B by PCR NEGATIVE NEGATIVE Final    Comment: (NOTE) The Xpert Xpress SARS-CoV-2/FLU/RSV plus assay is intended as an aid in the diagnosis of influenza from Nasopharyngeal swab specimens  and should not be used as a sole basis for treatment. Nasal washings and aspirates are unacceptable for Xpert Xpress SARS-CoV-2/FLU/RSV testing.  Fact Sheet for Patients: EntrepreneurPulse.com.au  Fact Sheet for Healthcare Providers: IncredibleEmployment.be  This test is not yet approved or cleared by the [place] FDA and has been authorized for detection and/or diagnosis of SARS-CoV-2 by FDA under an Emergency Use Authorization (EUA). This EUA will remain in effect (meaning this test can be used) for the duration of the COVID-19 declaration under Section 564(b)(1) of the Act, 21 U.S.C. section 360bbb-3(b)(1), unless the authorization is terminated or revoked.  Performed at [facility], [place] [address], [place], [place] [address]      Radiological Exams on Admission: No results found.   Assessment/Plan Nausea, vomiting, and diarrhea: Acute.  Patient presents with complaints of crampy lower abdominal pain and diarrhea that started prior to her leaving the hospital.  Noted associated symptoms of intermittent nausea and vomiting.  Received empiric antibiotics in the hospital during prior hospitalization due to concern for possible sepsis although no source was found.  Unclear patient's cause of symptoms at this time, but she had been recently given antibiotics for which C. difficile could be a possibility.  Differential also includes the possibility of thyroid issues vs. opiate withdrawal symptoms. -Admit to a medical telemetry bed -Monitor intake and output -Enteric precautions -Check C. difficile and GI panel -Check acute abdominal series -Imodium as needed if no signs of infection appreciated -Antiemetics as needed for nausea and vomiting  Hypokalemia: Acute.  On admission potassium 2.9.  Patient had been ordered 50 mEq of potassium chloride IV. -Normal saline IV fluids with 20 mEq of potassium chloride at 75 mL/h -Continue to  replace as needed  Leukocytosis: Acute.  WBC 14.2 which appears slightly higher than previous. -Recheck CBC tomorrow morning  Generalized weakness: Patient reports after getting home she was unable to get up for move due to weakness.  During last admission patient was recommended at the need for -PT/OT to evaluate and treat  Hypothyroidism: Last check TSH -15.713 and free T4 -> 5.5 on 10/28.  Patient is on levothyroxine 112 mcg daily.  Case discussed with Dr. Delrae Rend of endocrinology who inquired if the patient had been on any biotin supplement which can sometimes throw off the free T4 readings.  TSH reading is likely real and patient may need increased dose of levothyroxine unless she has been missing doses.  Patient reports that she was on some hair and nail supplement up until few days ago when she completed it.   -Recheck TSH and free T4 -Continue levothyroxine May warrant increasing dose - Dr. Buddy Duty of endocrinology stated that he would be more than happy to follow the patient in the outpatient setting  Chronic pain chronic opioid  use: Patient at baseline reports having issues with pain for which she is on chronic opioids.  UDS during last hospitalization on 10/28 was negative.  A secondary cause of patient's symptoms could be withdrawal if she had ran out of her prescriptions of opioid pain medications. -Continue current pain regimen  Depression bipolar disorder -Continue Prozac, Wellbutrin, and Seroquel  Transaminitis: ALT 76, but appears to be still trending down from initial high is a 374 during last hospitalization. -Continue to monitor  DVT prophylaxis: Lovenox Code Status: DNR Family Communication: None requested Disposition Plan: To be determined Consults called: None Admission status: Observation  Norval Morton MD [facility]   If 7PM-7AM, please contact night-coverage   01/18/2021, 8:52 AM

## 2021-01-18 NOTE — Progress Notes (Addendum)
C. difficile testing was negative and GI panel was still pending.  Repeat TSH -12.822 and free T4 -0.81 at this time.  As discussed with Dr. Buddy Duty of [facility] endocrinology patient had been on a hair nail supplement that likely had biotin in it that may have been the cause for acutely elevated free T4 value reported on 10/28.  He suspected that the TSH was likely elevated and still remains elevated.  He had recommended possibly increasing levothyroxine dosage in the patient if she had not reported missing any doses.  He is available to see the patient in the outpatient setting, but she would likely need referral.  No acute changes have been made to her dosing at this time as it is not totally clear why her doses had not changed and her thyroid levels previously have been within normal limits earlier this year.

## 2021-01-18 NOTE — Evaluation (Signed)
Physical Therapy Evaluation Patient Details Name: Victoria Levine MRN: [id number] DOB: 1950-08-08 Today's Date: 01/18/2021  History of Present Illness  Pt is a 70 y/o female admitted secondary to diarrhea and inability to care for herself at home on 11/1 after being discharged the same day. Pt with recent admission for AKI and rhabdomyolysis. PMH includes alcohol abuse, bipolar disorder, osteoporosis and anxiety.  Clinical Impression  Pt admitted secondary to problem above with deficits below. Pt requiring mod A for bed mobility and min A +2 to stand and ambulate short distance from stretcher to hospital bed in ED. Pt reporting dizziness, but BP WFL. Pt recently discharged and reports she could barely get in her home and was unable to care for herself. Recommending SNF level therapies at d/c to increase independence and safety with mobility. Will continue to follow acutely.        Recommendations for follow up therapy are one component of a multi-disciplinary discharge planning process, led by the attending physician.  Recommendations may be updated based on patient status, additional functional criteria and insurance authorization.  Follow Up Recommendations Skilled nursing-short term rehab (<3 hours/day)    Assistance Recommended at Discharge Frequent or constant Supervision/Assistance  Functional Status Assessment Patient has had a recent decline in their functional status and demonstrates the ability to make significant improvements in function in a reasonable and predictable amount of time.  Equipment Recommendations  Wheelchair (measurements PT);Wheelchair cushion (measurements PT);Hospital bed    Recommendations for Other Services       Precautions / Restrictions Precautions Precautions: Fall Restrictions Weight Bearing Restrictions: No      Mobility  Bed Mobility Overal bed mobility: Needs Assistance Bed Mobility: Supine to Sit;Sit to Supine     Supine to sit: Mod  assist Sit to supine: Supervision   General bed mobility comments: Mod A for trunk elevation to come to sitting. Pt reporting dizziness in sitting; BP WFL. Supervision to return to supine.    Transfers Overall transfer level: Needs assistance Equipment used: 2 person hand held assist Transfers: Sit to/from Stand Sit to Stand: Min assist;+2 physical assistance           General transfer comment: Min A +2 for steadying assist to stand from stretcher.    Ambulation/Gait Ambulation/Gait assistance: Min assist;+2 physical assistance Gait Distance (Feet): 2 Feet Assistive device: 2 person hand held assist Gait Pattern/deviations: Step-through pattern;Decreased stride length Gait velocity: Decreased   General Gait Details: Ambulated short distance from stretcher to regular hospital bed. Min A +2 for steadying assist. Pt reporting increased dizziness, so mobility limited. BP WFL  Stairs            Wheelchair Mobility    Modified Rankin (Stroke Patients Only)       Balance Overall balance assessment: Needs assistance Sitting-balance support: Feet supported;No upper extremity supported Sitting balance-Leahy Scale: Fair     Standing balance support: Bilateral upper extremity supported Standing balance-Leahy Scale: Poor Standing balance comment: Reliant on UE and external support                             Pertinent Vitals/Pain Pain Assessment: Faces Faces Pain Scale: Hurts a little bit Pain Location: abdomen Pain Descriptors / Indicators: Cramping Pain Intervention(s): Monitored during session;Limited activity within patient's tolerance;Repositioned    Home Living Family/patient expects to be discharged to:: Skilled nursing facility Living Arrangements: Alone Available Help at Discharge: Available PRN/intermittently;Friend(s) Type of Home: House  Home Access: Level entry       Home Layout: One level Home Equipment: Rollator (4 wheels);Rolling  Walker (2 wheels);Shower seat;Cane - single point;BSC      Prior Function               Mobility Comments: Pt reports she was independent prior to last admission. Pt reports when she got home after being discharged, she was very unstable and had to get back in the ambulance to come back       Hand Dominance        Extremity/Trunk Assessment   Upper Extremity Assessment Upper Extremity Assessment: Defer to OT evaluation    Lower Extremity Assessment Lower Extremity Assessment: Generalized weakness    Cervical / Trunk Assessment Cervical / Trunk Assessment: Kyphotic  Communication   Communication: No difficulties  Cognition Arousal/Alertness: Awake/alert Behavior During Therapy: WFL for tasks assessed/performed Overall Cognitive Status: No family/caregiver present to determine baseline cognitive functioning                                          General Comments      Exercises     Assessment/Plan    PT Assessment Patient needs continued PT services  PT Problem List Decreased strength;Decreased activity tolerance;Decreased balance;Decreased mobility;Decreased cognition;Decreased knowledge of use of DME;Decreased safety awareness;Decreased knowledge of precautions       PT Treatment Interventions DME instruction;Gait training;Functional mobility training;Therapeutic activities;Therapeutic exercise;Balance training;Cognitive remediation;Patient/family education    PT Goals (Current goals can be found in the Care Plan section)  Acute Rehab PT Goals Patient Stated Goal: to be able to walk PT Goal Formulation: With patient Time For Goal Achievement: 02/01/21 Potential to Achieve Goals: Fair    Frequency Min 2X/week   Barriers to discharge Decreased caregiver support      Co-evaluation               AM-PAC PT "6 Clicks" Mobility  Outcome Measure Help needed turning from your back to your side while in a flat bed without using  bedrails?: A Lot Help needed moving from lying on your back to sitting on the side of a flat bed without using bedrails?: A Lot Help needed moving to and from a bed to a chair (including a wheelchair)?: A Lot Help needed standing up from a chair using your arms (e.g., wheelchair or bedside chair)?: A Lot Help needed to walk in hospital room?: A Lot Help needed climbing 3-5 steps with a railing? : Total 6 Click Score: 11    End of Session Equipment Utilized During Treatment: Gait belt Activity Tolerance: Treatment limited secondary to medical complications (Comment) (dizziness) Patient left: in bed;with call bell/phone within reach (on bed in ED) Nurse Communication: Mobility status PT Visit Diagnosis: Unsteadiness on feet (R26.81);Difficulty in walking, not elsewhere classified (R26.2);History of falling (Z91.81);Repeated falls (R29.6);Muscle weakness (generalized) (M62.81)    Time: 7903-8333 PT Time Calculation (min) (ACUTE ONLY): 17 min   Charges:   PT Evaluation $PT Eval Moderate Complexity: 1 Mod          Reuel Derby, PT, DPT  Acute Rehabilitation Services  Pager: [phone] Office: [phone]   Rudean Hitt 01/18/2021, 4:31 PM

## 2021-01-18 NOTE — ED Notes (Signed)
ED TO INPATIENT HANDOFF REPORT  ED Nurse Name and Phone #:Zhavia Cunanan, RN [place] Name/Age/Gender [name] 70 y.o. female Room/Bed: 043C/043C  Code Status   Code Status: DNR  Home/SNF/Other Home Patient oriented to: self, place, time, and situation Is this baseline? Yes   Triage Complete: Triage complete  Chief Complaint Diarrhea [R19.7]  Triage Note Per EMS, pt from home c/o "same symptoms I left the hospital today with."  Pt c/o diarrhea and "I'm so weak I can't take care of myself."     Allergies Allergies  Allergen Reactions   Prednisone Other (See Comments)    Cannot tolerate ORALLY, but can tolerate by shot (interacts with anxiety & bipolarity)   Lithium Carbonate Other (See Comments)    Patient was taken off of this because of a weight gain of 40 pounds   Other Other (See Comments)    Serious reaction to 1st COVID vaccination - unknown type   Sulfa Antibiotics Other (See Comments)    From childhood; reaction not recalled    Level of Care/Admitting Diagnosis ED Disposition     ED Disposition  Admit   Condition  --   [place]: [place] [[id number]]  Level of Care: Telemetry Medical [104]  May place patient in observation at [facility] or [place] if equivalent level of care is available:: Yes  Covid Evaluation: Asymptomatic Screening Protocol (No Symptoms)  Diagnosis: Diarrhea [787.91.ICD-9-CM]  Admitting Physician: Shela Leff [[id number]]  Attending Physician: Shela Leff [[id number]]          B Medical/Surgery History Past Medical History:  Diagnosis Date   Anemia    low iron   Anxiety    Asthma    Bipolar disorder ([place])    Chronic back pain    Compression fracture of L2 lumbar vertebra (HCC)    DDD (degenerative disc disease)    Depression    Dyspnea    GERD (gastroesophageal reflux disease)    H/O alcohol abuse    28 years sober   H/O suicide attempt    Herpes    History of hiatal  hernia    Hypothyroidism    Migraines    Osteoporosis 11/2018   T score of -2.6   Pneumonia    PONV (postoperative nausea and vomiting)    S/P adenoidectomy    S/P tonsillectomy    Past Surgical History:  Procedure Laterality Date   CESAREAN SECTION     COLONOSCOPY     as a teenager   Compression fracture spine     DILATION AND CURETTAGE OF UTERUS     EYE SURGERY Bilateral    CATARACTS REMOVED, NO LENS PLACED   HYSTEROSCOPY     KYPHOPLASTY N/A 12/19/2016   Procedure: THORACIC 12 KYPHOPLASTY; REQUEST 60 MINS AND FLIP ROOM;  Surgeon: Phylliss Bob, MD;  Location: [facility];  Service: Orthopedics;  Laterality: N/A;  THORACIC 12 KYPHOPLASTY; REQUEST 60 MINS AND FLIP ROOM   KYPHOPLASTY N/A 12/11/2017   Procedure: LUMBAR 2 KYPHOPLASTY;  Surgeon: Phylliss Bob, MD;  Location: [facility];  Service: Orthopedics;  Laterality: N/A;   LUMBAR FUSION     TONSILLECTOMY     TOTAL HIP ARTHROPLASTY Left 02/24/2020   Procedure: TOTAL HIP ARTHROPLASTY ANTERIOR APPROACH;  Surgeon: Rod Can, MD;  Location: [facility];  Service: Orthopedics;  Laterality: Left;     A IV Location/Drains/Wounds Patient Lines/Drains/Airways Status     Active Line/Drains/Airways     Name Placement date  Placement time Site Days   Peripheral IV 01/18/21 18 G Left Antecubital 01/18/21  0543  Antecubital  less than 1   External Urinary Catheter 01/16/21  2200  --  2   Pressure Injury 03/09/20 Sacrum Posterior Stage 1 -  Intact skin with non-blanchable redness of a localized area usually over a bony prominence. dressing from nursing home in place on the sacrum, but to the right stage 1 noted.  03/09/20  1600  -- 315            Intake/Output Last 24 hours  Intake/Output Summary (Last 24 hours) at 01/18/2021 1507 Last data filed at 01/18/2021 1317 Gross per 24 hour  Intake 1373.78 ml  Output --  Net 1373.78 ml    Labs/Imaging Results for orders placed or performed during the hospital encounter of 01/17/21 (from the  past 48 hour(s))  CBC with Differential/Platelet     Status: Abnormal   Collection Time: 01/17/21 11:53 PM  Result Value Ref Range   WBC 14.2 (H) 4.0 - 10.5 K/uL   RBC 4.53 3.87 - 5.11 MIL/uL   Hemoglobin 14.2 12.0 - 15.0 g/dL   HCT 42.6 36.0 - 46.0 %   MCV 94.0 80.0 - 100.0 fL   MCH 31.3 26.0 - 34.0 pg   MCHC 33.3 30.0 - 36.0 g/dL   RDW 13.5 11.5 - 15.5 %   Platelets 253 150 - 400 K/uL   nRBC 0.0 0.0 - 0.2 %   Neutrophils Relative % 82 %   Neutro Abs 11.5 (H) 1.7 - 7.7 K/uL   Lymphocytes Relative 8 %   Lymphs Abs 1.2 0.7 - 4.0 K/uL   Monocytes Relative 7 %   Monocytes Absolute 1.0 0.1 - 1.0 K/uL   Eosinophils Relative 1 %   Eosinophils Absolute 0.2 0.0 - 0.5 K/uL   Basophils Relative 0 %   Basophils Absolute 0.0 0.0 - 0.1 K/uL   Immature Granulocytes 2 %   Abs Immature Granulocytes 0.33 (H) 0.00 - 0.07 K/uL    Comment: Performed at [facility], [address], [place], [place] [address]  Comprehensive metabolic panel     Status: Abnormal   Collection Time: 01/17/21 11:53 PM  Result Value Ref Range   Sodium 135 135 - 145 mmol/L   Potassium 2.9 (L) 3.5 - 5.1 mmol/L   Chloride 102 98 - 111 mmol/L   CO2 22 22 - 32 mmol/L   Glucose, Bld 84 70 - 99 mg/dL    Comment: Glucose reference range applies only to samples taken after fasting for at least 8 hours.   BUN 14 8 - 23 mg/dL   Creatinine, Ser 0.90 0.44 - 1.00 mg/dL   Calcium 8.9 8.9 - 10.3 mg/dL   Total Protein 5.3 (L) 6.5 - 8.1 g/dL   Albumin 2.6 (L) 3.5 - 5.0 g/dL   AST 33 15 - 41 U/L   ALT 76 (H) 0 - 44 U/L   Alkaline Phosphatase 82 38 - 126 U/L   Total Bilirubin 0.8 0.3 - 1.2 mg/dL   GFR, Estimated >60 >60 mL/min    Comment: (NOTE) Calculated using the CKD-EPI Creatinine Equation (2021)    Anion gap 11 5 - 15    Comment: Performed at [facility], [place] [address] [place], [place] [address]  Resp Panel by RT-PCR (Flu A&B, Covid) Nasopharyngeal Swab     Status: None   Collection Time: 01/18/21  6:33 AM    Specimen: Nasopharyngeal Swab;  Nasopharyngeal(NP) swabs in vial transport medium  Result Value Ref Range   SARS Coronavirus 2 by RT PCR NEGATIVE NEGATIVE    Comment: (NOTE) SARS-CoV-2 target nucleic acids are NOT DETECTED.  The SARS-CoV-2 RNA is generally detectable in upper respiratory specimens during the acute phase of infection. The lowest concentration of SARS-CoV-2 viral copies this assay can detect is 138 copies/mL. A negative result does not preclude SARS-Cov-2 infection and should not be used as the sole basis for treatment or other patient management decisions. A negative result may occur with  improper specimen collection/handling, submission of specimen other than nasopharyngeal swab, presence of viral mutation(s) within the areas targeted by this assay, and inadequate number of viral copies(<138 copies/mL). A negative result must be combined with clinical observations, patient history, and epidemiological information. The expected result is Negative.  Fact Sheet for Patients:  EntrepreneurPulse.com.au  Fact Sheet for Healthcare Providers:  IncredibleEmployment.be  This test is no t yet approved or cleared by the [place] FDA and  has been authorized for detection and/or diagnosis of SARS-CoV-2 by FDA under an Emergency Use Authorization (EUA). This EUA will remain  in effect (meaning this test can be used) for the duration of the COVID-19 declaration under Section 564(b)(1) of the Act, 21 U.S.C.section 360bbb-3(b)(1), unless the authorization is terminated  or revoked sooner.       Influenza A by PCR NEGATIVE NEGATIVE   Influenza B by PCR NEGATIVE NEGATIVE    Comment: (NOTE) The Xpert Xpress SARS-CoV-2/FLU/RSV plus assay is intended as an aid in the diagnosis of influenza from Nasopharyngeal swab specimens and should not be used as a sole basis for treatment. Nasal washings and aspirates are unacceptable for Xpert Xpress  SARS-CoV-2/FLU/RSV testing.  Fact Sheet for Patients: EntrepreneurPulse.com.au  Fact Sheet for Healthcare Providers: IncredibleEmployment.be  This test is not yet approved or cleared by the [place] FDA and has been authorized for detection and/or diagnosis of SARS-CoV-2 by FDA under an Emergency Use Authorization (EUA). This EUA will remain in effect (meaning this test can be used) for the duration of the COVID-19 declaration under Section 564(b)(1) of the Act, 21 U.S.C. section 360bbb-3(b)(1), unless the authorization is terminated or revoked.  Performed at [facility], [place] [address] [place], [place] [address]   Basic metabolic panel     Status: Abnormal   Collection Time: 01/18/21 12:20 PM  Result Value Ref Range   Sodium 136 135 - 145 mmol/L   Potassium 3.5 3.5 - 5.1 mmol/L   Chloride 103 98 - 111 mmol/L   CO2 24 22 - 32 mmol/L   Glucose, Bld 72 70 - 99 mg/dL    Comment: Glucose reference range applies only to samples taken after fasting for at least 8 hours.   BUN 13 8 - 23 mg/dL   Creatinine, Ser 0.88 0.44 - 1.00 mg/dL   Calcium 8.7 (L) 8.9 - 10.3 mg/dL   GFR, Estimated >60 >60 mL/min    Comment: (NOTE) Calculated using the CKD-EPI Creatinine Equation (2021)    Anion gap 9 5 - 15    Comment: Performed at [place] [address] [place], [place] [address]  C Difficile Quick Screen w PCR reflex     Status: None   Collection Time: 01/18/21  1:09 PM   Specimen: Stool  Result Value Ref Range   C Diff antigen NEGATIVE NEGATIVE   C Diff toxin NEGATIVE NEGATIVE   C Diff interpretation No C. difficile detected.  Comment: Performed at [facility], [place] [address], [place], [place] [address]  Magnesium     Status: None   Collection Time: 01/18/21  1:13 PM  Result Value Ref Range   Magnesium 1.9 1.7 - 2.4 mg/dL    Comment: Performed at [facility] [address], [place], [place] [address]  TSH      Status: Abnormal   Collection Time: 01/18/21  1:13 PM  Result Value Ref Range   TSH 12.822 (H) 0.350 - 4.500 uIU/mL    Comment: Performed by a 3rd Generation assay with a functional sensitivity of <=0.01 uIU/mL. Performed at [facility], [place] [address], [place], [place] [address]   T4, free     Status: None   Collection Time: 01/18/21  1:13 PM  Result Value Ref Range   Free T4 0.81 0.61 - 1.12 ng/dL    Comment: (NOTE) Biotin ingestion may interfere with free T4 tests. If the results are inconsistent with the TSH level, previous test results, or the clinical presentation, then consider biotin interference. If needed, order repeat testing after stopping biotin. Performed at [facility], [place] [address], [place], [place] [address]    No results found.  Pending Labs Unresulted Labs (From admission, onward)     Start     Ordered   01/19/21 0500  CBC  Tomorrow morning,   R        01/18/21 0908   01/19/21 6010  Basic metabolic panel  Tomorrow morning,   R        01/18/21 0908   01/19/21 0500  Magnesium  Tomorrow morning,   R        01/18/21 0908   01/18/21 0533  Gastrointestinal Panel by PCR , Stool  (Gastrointestinal Panel by PCR, Stool                                                                                                                                                     **Does Not include CLOSTRIDIUM DIFFICILE testing. **If CDIFF testing is needed, place order from the "C Difficile Testing" order set.**)  Once,   STAT       Question Answer Comment  Patient immune status Normal   Release to patient Immediate      01/18/21 0532   01/17/21 2337  Urinalysis, Routine w reflex microscopic  Once,   STAT        01/17/21 2336            Vitals/Pain Today's Vitals   01/18/21 0813 01/18/21 1039 01/18/21 1258 01/18/21 1454  BP: (!) 182/102 (!) 190/111 (!) 163/96   Pulse: 79 88 85   Resp: 17 18 18    Temp: 97.8 F (36.6 C) 97.7 F (36.5 C)    TempSrc:  Oral Oral  SpO2: 97% 98% 96%   PainSc: 6  6  7   0-No pain    Isolation Precautions Enteric precautions (UV disinfection)  Medications Medications  buPROPion (WELLBUTRIN XL) 24 hr tablet 150 mg (150 mg Oral Given 01/18/21 1055)  FLUoxetine (PROZAC) capsule 20 mg (20 mg Oral Given 01/18/21 1056)  QUEtiapine (SEROQUEL) tablet 400 mg (has no administration in time range)  levothyroxine (SYNTHROID) tablet 112 mcg (has no administration in time range)  Oxcarbazepine (TRILEPTAL) tablet 600 mg (600 mg Oral Given 01/18/21 1056)  gabapentin (NEURONTIN) capsule 600 mg (has no administration in time range)  famotidine (PEPCID) tablet 20 mg (has no administration in time range)  enoxaparin (LOVENOX) injection 40 mg (40 mg Subcutaneous Given 01/18/21 1055)  sodium chloride flush (NS) 0.9 % injection 3 mL (3 mLs Intravenous Given 01/18/21 1058)  acetaminophen (TYLENOL) tablet 650 mg (has no administration in time range)    Or  acetaminophen (TYLENOL) suppository 650 mg (has no administration in time range)  ondansetron (ZOFRAN) tablet 4 mg (has no administration in time range)    Or  ondansetron (ZOFRAN) injection 4 mg (has no administration in time range)  albuterol (PROVENTIL) (2.5 MG/3ML) 0.083% nebulizer solution 2.5 mg (has no administration in time range)  hydrALAZINE (APRESOLINE) injection 10 mg (has no administration in time range)  oxyCODONE (OXYCONTIN) 12 hr tablet 10 mg (has no administration in time range)  oxyCODONE (Oxy IR/ROXICODONE) immediate release tablet 10 mg (has no administration in time range)  SUMAtriptan (IMITREX) tablet 25 mg (has no administration in time range)  0.45 % NaCl with KCl 20 mEq / L infusion (has no administration in time range)  loperamide (IMODIUM) capsule 2 mg (has no administration in time range)  ondansetron (ZOFRAN-ODT) disintegrating tablet 4 mg (4 mg Oral Given 01/17/21 2339)  ondansetron (ZOFRAN) injection 4 mg (4 mg Intravenous Given 01/18/21 0551)   magnesium sulfate IVPB 2 g 50 mL (0 g Intravenous Stopped 01/18/21 0655)  potassium chloride 10 mEq in 100 mL IVPB (0 mEq Intravenous Stopped 01/18/21 1208)  fentaNYL (SUBLIMAZE) injection 25 mcg (25 mcg Intravenous Given 01/18/21 1256)  0.9 %  sodium chloride infusion ( Intravenous New Bag/Given 01/18/21 1318)    Mobility walks with person assist High fall risk   Focused Assessments Cardiac Assessment Handoff:    Lab Results  Component Value Date   CKTOTAL 100 01/15/2021   CKMB 1.5 10/05/2010   TROPONINI <0.30 10/05/2010   No results found for: DDIMER Does the Patient currently have chest pain? No    R Recommendations: See Admitting Provider Note  Report given to:   Additional Notes:

## 2021-01-18 NOTE — ED Provider Notes (Signed)
[facility] EMERGENCY DEPARTMENT Provider Note   CSN: [id number] Arrival date & time: 01/17/21  2148     History Chief Complaint  Patient presents with   Diarrhea   Weakness    Victoria Levine is a 70 y.o. female.   Diarrhea Quality:  Copious and watery Severity:  Severe Onset quality:  Gradual Timing:  Intermittent Progression:  Worsening Relieved by:  Nothing Worsened by:  Nothing Ineffective treatments:  None tried Associated symptoms: vomiting   Associated symptoms: no fever   Risk factors: no recent antibiotic use   Weakness Severity:  Severe Onset quality:  Gradual Duration:  1 week Timing:  Constant Progression:  Worsening Chronicity:  New Context: not urinary tract infection   Relieved by:  Nothing Worsened by:  Nothing Ineffective treatments:  None tried Associated symptoms: diarrhea, falls, nausea and vomiting   Associated symptoms: no aphasia and no fever     70 y.o. F here with N/V/D.  Just discharged from hospital yesterday afternoon at [facility] after admission for same.  States she feels no better and is too weak to care for herself at home.  No fever.  Past Medical History:  Diagnosis Date   Anemia    low iron   Anxiety    Asthma    Bipolar disorder ([place])    Chronic back pain    Compression fracture of L2 lumbar vertebra (HCC)    DDD (degenerative disc disease)    Depression    Dyspnea    GERD (gastroesophageal reflux disease)    H/O alcohol abuse    28 years sober   H/O suicide attempt    Herpes    History of hiatal hernia    Hypothyroidism    Migraines    Osteoporosis 11/2018   T score of -2.6   Pneumonia    PONV (postoperative nausea and vomiting)    S/P adenoidectomy    S/P tonsillectomy     Patient Active Problem List   Diagnosis Date Noted   Lactic acidosis 01/14/2021   Sepsis ([place]) 01/14/2021   Hypercalcemia 01/14/2021   Leukocytosis 01/14/2021   Thrombocytosis 01/14/2021   Dehydration 01/14/2021    Transaminitis 01/14/2021   Acute kidney injury ([place]) 01/13/2021   Encephalopathy 03/09/2020   Pressure injury of skin 98/33/8250   Acute metabolic encephalopathy 53/97/6734   Displaced intertrochanteric fracture of left femur, initial encounter for closed fracture ([place]) 02/23/2020   Hypokalemia 02/23/2020   Intractable nausea and vomiting 08/31/2019   Epigastric pain 08/31/2019   Alcohol abuse 08/31/2019   Microcytic anemia 08/31/2019   Closed compression fracture of L2 vertebra ([place]) 12/11/2017   Pain 07/26/2015   Anxiety state 06/28/2015   Benzodiazepine dependence ([place]) 10/13/2014   Opioid abuse with opioid-induced mood disorder ([place]) 10/13/2014   Bipolar 1 disorder, depressed ([place]) 10/12/2014   Spondylolisthesis of lumbar region 07/28/2014   Osteoporosis 11/17/2013   Migraines    Herpes    Hypothyroidism 08/13/2008   HYPERCHOLESTEROLEMIA 08/13/2008   Bipolar disorder ([place]) 08/13/2008   Asthma-chronic obstructive pulmonary disease overlap syndrome ([place]) 08/13/2008   GERD (gastroesophageal reflux disease) 08/13/2008   HIATAL HERNIA 08/13/2008   ALLERGY 08/13/2008    Past Surgical History:  Procedure Laterality Date   CESAREAN SECTION     COLONOSCOPY     as a teenager   Compression fracture spine     DILATION AND CURETTAGE OF UTERUS     EYE SURGERY Bilateral    CATARACTS REMOVED, NO LENS PLACED  HYSTEROSCOPY     KYPHOPLASTY N/A 12/19/2016   Procedure: THORACIC 12 KYPHOPLASTY; REQUEST 60 MINS AND FLIP ROOM;  Surgeon: Phylliss Bob, MD;  Location: [place];  Service: Orthopedics;  Laterality: N/A;  THORACIC 12 KYPHOPLASTY; REQUEST 60 MINS AND FLIP ROOM   KYPHOPLASTY N/A 12/11/2017   Procedure: LUMBAR 2 KYPHOPLASTY;  Surgeon: Phylliss Bob, MD;  Location: [place];  Service: Orthopedics;  Laterality: N/A;   LUMBAR FUSION     TONSILLECTOMY     TOTAL HIP ARTHROPLASTY Left 02/24/2020   Procedure: TOTAL HIP ARTHROPLASTY ANTERIOR APPROACH;  Surgeon: Rod Can, MD;  Location:  [facility];  Service: Orthopedics;  Laterality: Left;     OB History     Gravida  3   Para  1   Term  1   Preterm      AB  2   Living  1      SAB      IAB      Ectopic      Multiple      Live Births              Family History  Adopted: Yes    Social History   Tobacco Use   Smoking status: Never   Smokeless tobacco: Never  Vaping Use   Vaping Use: Never used  Substance Use Topics   Alcohol use: Yes    Alcohol/week: 0.0 standard drinks   Drug use: Not Currently    Types: Benzodiazepines, Cocaine    Home Medications Prior to Admission medications   Medication Sig Start Date End Date Taking? Authorizing Provider  albuterol (PROVENTIL HFA;VENTOLIN HFA) 108 (90 Base) MCG/ACT inhaler Inhale 2 puffs into the lungs every 6 (six) hours as needed for wheezing or shortness of breath.    [provider]  albuterol (PROVENTIL) (2.5 MG/3ML) 0.083% nebulizer solution Take 2.5 mg by nebulization every 6 (six) hours as needed for wheezing or shortness of breath.    [provider]  budesonide (PULMICORT) 0.25 MG/2ML nebulizer solution Take 2 mLs (0.25 mg total) by nebulization 2 (two) times daily. Patient not taking: No sig reported 03/02/20   Lorella Nimrod, MD  buPROPion (WELLBUTRIN XL) 150 MG 24 hr tablet Take 150 mg by mouth 2 (two) times daily. 10/01/16   [provider]  EPINEPHrine 0.3 mg/0.3 mL IJ SOAJ injection Inject 0.3 mg into the skin daily as needed for anaphylaxis.  04/10/19   [provider]  famotidine (PEPCID) 20 MG tablet Take 1 tablet (20 mg total) by mouth at bedtime. 09/03/19 03/18/21  Amin, Jeanella Flattery, MD  FLUoxetine (PROZAC) 20 MG capsule Take 20 mg by mouth 2 (two) times daily.     [provider]  gabapentin (NEURONTIN) 600 MG tablet Take 600 mg by mouth 3 (three) times daily as needed (pain). 01/09/21   [provider]  levothyroxine (SYNTHROID, LEVOTHROID) 112 MCG tablet Take 112 mcg by mouth  daily before breakfast.  09/04/17   [provider]  loperamide (IMODIUM) 2 MG capsule Take 2 capsules (4 mg total) by mouth as needed for diarrhea or loose stools. 01/17/21   Amin, Jeanella Flattery, MD  Multiple Vitamin (MULTIVITAMIN WITH MINERALS) TABS tablet Take 1 tablet by mouth daily.    [provider]  naproxen sodium (ALEVE) 220 MG tablet Take 440 mg by mouth every 12 (twelve) hours as needed (pain).    [provider]  oxcarbazepine (TRILEPTAL) 600 MG tablet Take 600 mg by mouth 2 (  two) times daily.    [provider]  oxyCODONE ER (XTAMPZA ER) 9 MG C12A Take 9 mg by mouth 2 (two) times daily.    [provider]  Oxycodone HCl 10 MG TABS Take 1 tablet (10 mg total) by mouth every 4 (four) hours as needed (for breakthrough pain). Patient taking differently: Take 10 mg by mouth 2 (two) times daily as needed (pain). 02/26/20   Dorothyann Peng, PA  QUEtiapine (SEROQUEL) 400 MG tablet Take 400 mg by mouth every evening. 11/12/20   [provider]  rizatriptan (MAXALT-MLT) 10 MG disintegrating tablet Take 10 mg by mouth daily as needed for migraine.  Patient not taking: No sig reported 11/08/14   [provider]    Allergies    Prednisone, Lithium carbonate, Other, and Sulfa antibiotics  Review of Systems   Review of Systems  Constitutional:  Negative for fever.  HENT:  Negative for voice change.   Eyes:  Negative for redness.  Respiratory:  Negative for wheezing and stridor.   Cardiovascular:  Negative for leg swelling.  Gastrointestinal:  Positive for diarrhea, nausea and vomiting.  Musculoskeletal:  Positive for falls. Negative for neck stiffness.  Neurological:  Positive for weakness.  Psychiatric/Behavioral:  Negative for agitation.   All other systems reviewed and are negative.  Physical Exam Updated Vital Signs BP (!) 190/106 (BP Location: Left Arm)   Pulse 87   Temp 98.1 F (36.7 C) (Oral)   Resp 15   SpO2 99%    Physical Exam Vitals and nursing note reviewed.  Constitutional:      General: She is not in acute distress.    Appearance: She is well-developed. She is not diaphoretic.  HENT:     Head: Normocephalic and atraumatic.     Comments: Bruise around left eye and into cheek, appears old    Right Ear: External ear normal.     Left Ear: External ear normal.     Nose: No rhinorrhea.     Comments: Ecchymosis  Eyes:     Pupils: Pupils are equal, round, and reactive to light.  Cardiovascular:     Rate and Rhythm: Normal rate and regular rhythm.     Pulses: Normal pulses.     Heart sounds: Normal heart sounds.  Pulmonary:     Effort: No respiratory distress.     Breath sounds: Normal breath sounds.  Abdominal:     General: Bowel sounds are normal. There is no distension.     Palpations: Abdomen is soft.     Tenderness: There is no abdominal tenderness. There is no guarding or rebound.  Genitourinary:    Vagina: No vaginal discharge.  Musculoskeletal:        General: Normal range of motion.     Cervical back: Normal range of motion and neck supple.  Skin:    Capillary Refill: Capillary refill takes less than 2 seconds.     Findings: No erythema or rash.  Neurological:     General: No focal deficit present.     Deep Tendon Reflexes: Reflexes normal.  Psychiatric:        Mood and Affect: Mood normal.        Behavior: Behavior normal.    ED Results / Procedures / Treatments   Labs (all labs ordered are listed, but only abnormal results are displayed) Results for orders placed or performed during the hospital encounter of 01/17/21  CBC with Differential/Platelet  Result Value Ref Range   WBC  14.2 (H) 4.0 - 10.5 K/uL   RBC 4.53 3.87 - 5.11 MIL/uL   Hemoglobin 14.2 12.0 - 15.0 g/dL   HCT 42.6 36.0 - 46.0 %   MCV 94.0 80.0 - 100.0 fL   MCH 31.3 26.0 - 34.0 pg   MCHC 33.3 30.0 - 36.0 g/dL   RDW 13.5 11.5 - 15.5 %   Platelets 253 150 - 400 K/uL   nRBC 0.0 0.0 - 0.2 %    Neutrophils Relative % 82 %   Neutro Abs 11.5 (H) 1.7 - 7.7 K/uL   Lymphocytes Relative 8 %   Lymphs Abs 1.2 0.7 - 4.0 K/uL   Monocytes Relative 7 %   Monocytes Absolute 1.0 0.1 - 1.0 K/uL   Eosinophils Relative 1 %   Eosinophils Absolute 0.2 0.0 - 0.5 K/uL   Basophils Relative 0 %   Basophils Absolute 0.0 0.0 - 0.1 K/uL   Immature Granulocytes 2 %   Abs Immature Granulocytes 0.33 (H) 0.00 - 0.07 K/uL  Comprehensive metabolic panel  Result Value Ref Range   Sodium 135 135 - 145 mmol/L   Potassium 2.9 (L) 3.5 - 5.1 mmol/L   Chloride 102 98 - 111 mmol/L   CO2 22 22 - 32 mmol/L   Glucose, Bld 84 70 - 99 mg/dL   BUN 14 8 - 23 mg/dL   Creatinine, Ser 0.90 0.44 - 1.00 mg/dL   Calcium 8.9 8.9 - 10.3 mg/dL   Total Protein 5.3 (L) 6.5 - 8.1 g/dL   Albumin 2.6 (L) 3.5 - 5.0 g/dL   AST 33 15 - 41 U/L   ALT 76 (H) 0 - 44 U/L   Alkaline Phosphatase 82 38 - 126 U/L   Total Bilirubin 0.8 0.3 - 1.2 mg/dL   GFR, Estimated >60 >60 mL/min   Anion gap 11 5 - 15   CT ABDOMEN PELVIS WO CONTRAST  Result Date: 01/13/2021 CLINICAL DATA:  Sepsis EXAM: CT ABDOMEN AND PELVIS WITHOUT CONTRAST TECHNIQUE: Multidetector CT imaging of the abdomen and pelvis was performed following the standard protocol without IV contrast. COMPARISON:  Virtual colonoscopy dated 11/17/2019 FINDINGS: Lower chest: Although not ordered, almost the entire chest is within the field of view. No evidence of pneumonia. Mild compressive atelectasis in the medial right middle lobe. No pleural effusion or pneumothorax. Large fat containing anterior hernia along the right anterior aspect of the heart. The heart is normal in size. No pericardial effusion. Mild atherosclerotic calcifications of the arch. Hepatobiliary: Unenhanced liver is unremarkable. Gallbladder is unremarkable. No intrahepatic or extrahepatic ductal dilatation. Pancreas: Within normal limits. Spleen: Within normal limits. Adrenals/Urinary Tract: Adrenal glands are within  normal limits. Kidneys are within normal limits. No renal calculi or hydronephrosis. Bladder decompressed by an indwelling Foley catheter with trace gas. Stomach/Bowel: Stomach is notable for a moderate hiatal hernia/intrathoracic stomach. No evidence of bowel obstruction. Normal appendix (series 8/image 4). Mild sigmoid diverticulosis, without evidence of diverticulitis. Vascular/Lymphatic: No evidence of abdominal aortic aneurysm. Atherosclerotic calcifications of the abdominal aorta and branch vessels. No suspicious abdominopelvic lymphadenopathy. Reproductive: Uterus is within normal limits. Bilateral ovaries are unremarkable. Other: No abdominopelvic ascites. Musculoskeletal: Severe compression fracture deformity at T3 with mild retropulsion, age indeterminate. Mild superior endplate compression fracture deformity at T8, likely chronic. Prior vertebral augmentation at T12 and L2. Status post PLIF at L3-5. Degenerative changes of the lumbar spine. Left hip arthroplasty. IMPRESSION: No evidence of bowel obstruction. Normal appendix. Mild sigmoid diverticulosis, without evidence of diverticulitis. Bladder decompressed by an  indwelling Foley catheter. No evidence of pneumonia. Moderate hiatal hernia.  Large anterior hernia with fat. Severe compression fracture deformity at T3 with mild retropulsion, age indeterminate. Electronically Signed   By: Julian Hy M.D.   On: 01/13/2021 20:07   CT Head Wo Contrast  Result Date: 01/13/2021 CLINICAL DATA:  Head trauma EXAM: CT HEAD WITHOUT CONTRAST TECHNIQUE: Contiguous axial images were obtained from the base of the skull through the vertex without intravenous contrast. COMPARISON:  CT brain 01/04/2021, 03/02/2020, 02/23/2020 FINDINGS: Brain: No acute territorial infarction, hemorrhage or intracranial mass is visualized. Atrophy and mild chronic small vessel ischemic changes of the white matter. Small chronic infarcts in the cerebellum. Stable left posterior  parafalcine round calcification likely calcified meningioma. Stable ventricle size. Vascular: No hyperdense vessels.  Carotid vascular calcification Skull: No fracture. Stable 11 mm intra osseous ground-glass lesion within the left frontal bone, felt benign. Sinuses/Orbits: No acute finding. Other: None IMPRESSION: 1. No CT evidence for acute intracranial abnormality. 2. Atrophy and mild chronic small vessel ischemic changes of the white matter Electronically Signed   By: Donavan Foil M.D.   On: 01/13/2021 16:12   CT HEAD WO CONTRAST (5MM)  Result Date: 01/04/2021 CLINICAL DATA:  [place] 2 days ago with trauma to the head and face. EXAM: CT HEAD WITHOUT CONTRAST TECHNIQUE: Contiguous axial images were obtained from the base of the skull through the vertex without intravenous contrast. COMPARISON:  03/09/2020 FINDINGS: Brain: Mild age related volume loss. No evidence of old or acute focal infarction, mass lesion, hemorrhage, hydrocephalus or extra-axial collection. Vascular: There is atherosclerotic calcification of the major vessels at the base of the brain. Skull: Negative Sinuses/Orbits: Clear/normal Other: None IMPRESSION: No acute or traumatic finding.  Mild age related volume loss. Electronically Signed   By: Nelson Chimes M.D.   On: 01/04/2021 14:44   DG Chest Port 1 View  Result Date: 01/13/2021 CLINICAL DATA:  Sepsis EXAM: PORTABLE CHEST 1 VIEW COMPARISON:  03/09/2020 FINDINGS: Single frontal view of the chest demonstrates a stable cardiac silhouette. Stable hiatal hernia and prominent epicardial fat. No acute airspace disease, effusion, or pneumothorax. No acute bony abnormalities. IMPRESSION: 1. Stable chest, no acute intrathoracic process. Electronically Signed   By: Randa Ngo M.D.   On: 01/13/2021 16:00   CT Maxillofacial Wo Contrast  Result Date: 01/04/2021 CLINICAL DATA:  [place] 2 days ago with facial injury and bruising. EXAM: CT MAXILLOFACIAL WITHOUT CONTRAST TECHNIQUE: Multidetector  CT imaging of the maxillofacial structures was performed. Multiplanar CT image reconstructions were also generated. COMPARISON:  None. FINDINGS: Osseous: No facial fracture or focal bone lesion. Orbits: No evidence of orbital injury. Sinuses: Sinuses are clear. No traumatic fluid or inflammatory changes. Soft tissues: No soft tissue finding of note by CT. Limited intracranial: Normal IMPRESSION: Negative CT scan of the face. No evidence of fracture or orbital injury. Electronically Signed   By: Nelson Chimes M.D.   On: 01/04/2021 14:43   US Abdomen Limited RUQ (LIVER/GB)  Result Date: 01/13/2021 CLINICAL DATA:  Right upper quadrant abdominal pain. EXAM: ULTRASOUND ABDOMEN LIMITED RIGHT UPPER QUADRANT COMPARISON:  Abdominal ultrasound and CT 08/31/2019 FINDINGS: Gallbladder: Physiologically distended. Layering intraluminal sludge. Gallstones on prior ultrasound are not well seen on the current exam. No gallbladder wall thickening. No sonographic Murphy sign noted by sonographer. Common bile duct: Diameter: 6 mm. Liver: Heterogeneous increased parenchymal echogenicity. The liver is difficult to penetrate. Allowing for this, no focal lesion. Technically limited assessment due to altered mental status and  difficulty with breath hold. Portal vein is patent on color Doppler imaging with normal direction of blood flow towards the liver. Other: No right upper quadrant ascites. IMPRESSION: 1. Gallbladder sludge. Gallstones on prior ultrasound are not well seen on the current exam. No sonographic findings of acute cholecystitis. 2. No biliary dilatation. 3. Suggestion of hepatic steatosis. Electronically Signed   By: Keith Rake M.D.   On: 01/13/2021 18:10    EKG None  Radiology No results found.  Procedures Procedures   Medications Ordered in ED Medications  0.9 %  sodium chloride infusion ( Intravenous New Bag/Given 01/18/21 0550)  ondansetron (ZOFRAN) injection 4 mg (has no administration in time  range)  magnesium sulfate IVPB 2 g 50 mL (has no administration in time range)  potassium chloride 10 mEq in 100 mL IVPB (has no administration in time range)  ondansetron (ZOFRAN-ODT) disintegrating tablet 4 mg (4 mg Oral Given 01/17/21 2339)    ED Course  I have reviewed the triage vital signs and the nursing notes.  Pertinent labs & imaging results that were available during my care of the patient were reviewed by me and considered in my medical decision making (see chart for details).     Final Clinical Impression(s) / ED Diagnoses Final diagnoses:  Dehydration  Diarrhea, unspecified type  Generalized weakness       Evania Lyne, MD 01/18/21 0071

## 2021-01-18 NOTE — ED Notes (Signed)
Pt in bed, pt had a watery mucous bm, sample obtained for brief, cleaned pt, changed brief and any soiled linen.  Pt resting comfortable at this time.  Pt reports a decrease in pain after fentanyl.

## 2021-01-18 NOTE — ED Notes (Signed)
Patient cleaned up and changed 

## 2021-01-19 ENCOUNTER — Observation Stay (HOSPITAL_COMMUNITY): Payer: Medicare HMO

## 2021-01-19 DIAGNOSIS — J984 Other disorders of lung: Secondary | ICD-10-CM | POA: Diagnosis not present

## 2021-01-19 DIAGNOSIS — R197 Diarrhea, unspecified: Secondary | ICD-10-CM | POA: Diagnosis not present

## 2021-01-19 DIAGNOSIS — J9811 Atelectasis: Secondary | ICD-10-CM | POA: Diagnosis not present

## 2021-01-19 DIAGNOSIS — K469 Unspecified abdominal hernia without obstruction or gangrene: Secondary | ICD-10-CM | POA: Diagnosis not present

## 2021-01-19 LAB — GASTROINTESTINAL PANEL BY PCR, STOOL (REPLACES STOOL CULTURE)

## 2021-01-19 LAB — BASIC METABOLIC PANEL
Anion gap: 5 (ref 5–15)
BUN: 14 mg/dL (ref 8–23)
CO2: 24 mmol/L (ref 22–32)
Calcium: 8.2 mg/dL — ABNORMAL LOW (ref 8.9–10.3)
Chloride: 105 mmol/L (ref 98–111)
Creatinine, Ser: 0.88 mg/dL (ref 0.44–1.00)
GFR, Estimated: >60 mL/min (ref >60)
Glucose, Bld: 78 mg/dL (ref 70–99)
Potassium: 3.2 mmol/L — ABNORMAL LOW (ref 3.5–5.1)
Sodium: 134 mmol/L — ABNORMAL LOW (ref 135–145)

## 2021-01-19 LAB — CBC
HCT: 36.2 % (ref 36.0–46.0)
Hemoglobin: 12.2 g/dL (ref 12.0–15.0)
MCH: 31 pg (ref 26.0–34.0)
MCHC: 33.7 g/dL (ref 30.0–36.0)
MCV: 91.9 fL (ref 80.0–100.0)
Platelets: 269 10*3/uL (ref 150–400)
RBC: 3.94 MIL/uL (ref 3.87–5.11)
RDW: 13.5 % (ref 11.5–15.5)
WBC: 12.6 10*3/uL — ABNORMAL HIGH (ref 4.0–10.5)
nRBC: 0 % (ref 0.0–0.2)

## 2021-01-19 LAB — CULTURE, BLOOD (ROUTINE X 2)
Culture: NO GROWTH
Special Requests: ADEQUATE

## 2021-01-19 LAB — MAGNESIUM: Magnesium: 1.5 mg/dL — ABNORMAL LOW (ref 1.7–2.4)

## 2021-01-19 MED ORDER — POTASSIUM CHLORIDE CRYS ER 20 MEQ PO TBCR
40.0000 meq | EXTENDED_RELEASE_TABLET | Freq: Once | ORAL | Status: AC
  Administered 2021-01-19: 40 meq via ORAL
  Filled 2021-01-19: qty 2

## 2021-01-19 MED ORDER — POTASSIUM CHLORIDE CRYS ER 20 MEQ PO TBCR: 20.0000 meq | EXTENDED_RELEASE_TABLET | Freq: Once | ORAL | Status: DC

## 2021-01-19 MED ORDER — POTASSIUM CHLORIDE 20 MEQ PO PACK
20.0000 meq | PACK | Freq: Once | ORAL | Status: AC
  Administered 2021-01-19: 20 meq via ORAL
  Filled 2021-01-19: qty 1

## 2021-01-19 MED ORDER — MAGNESIUM SULFATE 2 GM/50ML IV SOLN
2.0000 g | Freq: Once | INTRAVENOUS | Status: AC
  Administered 2021-01-19: 2 g via INTRAVENOUS
  Filled 2021-01-19: qty 50

## 2021-01-19 NOTE — Evaluation (Signed)
Occupational Therapy Evaluation Patient Details Name: Victoria Levine MRN: [id number] DOB: 03-10-1951 Today's Date: 01/19/2021   History of Present Illness 70 y/o female admitted secondary to diarrhea and inability to care for herself at home on 11/1 after being discharged the same day. Pt with recent admission for AKI and rhabdomyolysis. PMH includes alcohol abuse, bipolar disorder, osteoporosis and anxiety.   Clinical Impression   PTA, pt was living at home and was performing ADLs and IADLs; recent dc and reporting "I wasn't able to care for myself at home". Pt currently requiring Min Guard-Min A for UB ADLs, Min-Mod A for LB ADLs, and Min A for functional transfers with RW. Pt limited by dizziness; taking BP and stable. Pt presenting with decreased balance, strength, and cognition impacting her functional performance. Pt would benefit from further acute OT to facilitate safe dc. Recommend dc to SNF for further OT to optimize safety, independence with ADLs, and return to PLOF.      Recommendations for follow up therapy are one component of a multi-disciplinary discharge planning process, led by the attending physician.  Recommendations may be updated based on patient status, additional functional criteria and insurance authorization.   Follow Up Recommendations  Skilled nursing-short term rehab (<3 hours/day)    Assistance Recommended at Discharge Set up Supervision/Assistance  Functional Status Assessment  Patient has had a recent decline in their functional status and demonstrates the ability to make significant improvements in function in a reasonable and predictable amount of time.  Equipment Recommendations  None recommended by OT    Recommendations for Other Services PT consult     Precautions / Restrictions Precautions Precautions: Fall Restrictions Weight Bearing Restrictions: No      Mobility Bed Mobility Overal bed mobility: Needs Assistance Bed Mobility: Supine to  Sit     Supine to sit: Min guard;HOB elevated     General bed mobility comments: Min Guard A for safety    Transfers Overall transfer level: Needs assistance Equipment used: Rolling walker (2 wheels) Transfers: Sit to/from [facility] Sit to Stand: Min assist Stand pivot transfers: Min assist         General transfer comment: Min A for standing balance      Balance Overall balance assessment: Needs assistance Sitting-balance support: No upper extremity supported Sitting balance-Leahy Scale: Good Sitting balance - Comments: able to don socks   Standing balance support: During functional activity;Reliant on assistive device for balance Standing balance-Leahy Scale: Poor Standing balance comment: Reliant on UE support                           ADL either performed or assessed with clinical judgement   ADL Overall ADL's : Needs assistance/impaired Eating/Feeding: Set up;Sitting   Grooming: Supervision/safety;Set up;Wash/dry face;Sitting   Upper Body Bathing: Min guard;Bed level   Lower Body Bathing: Moderate assistance;Sit to/from stand   Upper Body Dressing : Minimal assistance;Sitting   Lower Body Dressing: Supervision/safety;Sit to/from stand;Minimal assistance Lower Body Dressing Details (indicate cue type and reason): donning socks at EOB with supervision. Min A for standing balance Toilet Transfer: Rolling walker (2 wheels);Stand-pivot;Cueing for sequencing;Cueing for safety;Minimal assistance Toilet Transfer Details (indicate cue type and reason): simulated toilet transfer [place] and Hygiene: Moderate assistance;Minimal assistance;Sit to/from stand       Functional mobility during ADLs: Minimal assistance;Rolling walker (2 wheels) (stand pivot) General ADL Comments: Pt with dizziness throughout. Decreased cognition, balance, and safety  Vision Baseline Vision/History: 1 Wears glasses Patient Visual  Report: No change from baseline Vision Assessment?: No apparent visual deficits     Perception     Praxis      Pertinent Vitals/Pain Pain Assessment: 0-10 Pain Score: 5  Pain Location: abdomen Pain Descriptors / Indicators: Cramping Pain Intervention(s): Limited activity within patient's tolerance;Monitored during session;Repositioned     Hand Dominance Right   Extremity/Trunk Assessment Upper Extremity Assessment Upper Extremity Assessment: Overall WFL for tasks assessed   Lower Extremity Assessment Lower Extremity Assessment: Defer to PT evaluation   Cervical / Trunk Assessment Cervical / Trunk Assessment: Kyphotic   Communication Communication Communication: Other (comment)   Cognition Arousal/Alertness: Awake/alert Behavior During Therapy: WFL for tasks assessed/performed Overall Cognitive Status: No family/caregiver present to determine baseline cognitive functioning Area of Impairment: Safety/judgement;Memory;Attention;Following commands;Awareness;Problem solving                   Current Attention Level: Sustained Memory: Decreased short-term memory Following Commands: Follows one step commands with increased time Safety/Judgement: Decreased awareness of deficits Awareness: Intellectual Problem Solving: Requires verbal cues;Requires tactile cues;Slow processing;Decreased initiation;Difficulty sequencing General Comments: Baseline bipolar disorder. Pt's goal is to return home to care for pet cat. Pt recalling 1/3 ST memory words; able to recall last two words with Max cues. Pt tangiental in conversation and impulsive.     General Comments  Pt frequently c/o dizziness during session, BP taken twice, once while seated EOB (139/90) and once after transferring to chair (137/77)    Exercises     Shoulder Instructions      Home Living Family/patient expects to be discharged to:: Skilled nursing facility Living Arrangements: Alone Available Help at  Discharge: Available PRN/intermittently;Friend(s) Type of Home: House Home Access: Level entry     Home Layout: One level     Bathroom Shower/Tub: Walk-in Hydrologist: Handicapped height     Home Equipment: Rollator (4 wheels);Rolling Walker (2 wheels);Shower seat;Cane - single point;BSC          Prior Functioning/Environment Prior Level of Function : Independent/Modified Independent             Mobility Comments: Uses rollator for mobility ADLs Comments: ADLs, IADLs, driving, and has a lady come clean once a month        OT Problem List: Impaired balance (sitting and/or standing);Decreased safety awareness;Decreased cognition;Pain      OT Treatment/Interventions: Self-care/ADL training;DME and/or AE instruction;Patient/family education;Balance training    OT Goals(Current goals can be found in the care plan section) Acute Rehab OT Goals Patient Stated Goal: to return home and care for cat OT Goal Formulation: With patient Time For Goal Achievement: 02/02/21 Potential to Achieve Goals: Good ADL Goals Pt Will Perform Upper Body Dressing: with set-up;sitting Pt Will Perform Lower Body Dressing: with min assist;sit to/from stand Pt Will Perform Toileting - Clothing Manipulation and hygiene: with min assist;sit to/from stand Pt Will Perform Tub/Shower Transfer: Shower transfer;Tub transfer;with min assist;3 in 1;rolling walker  OT Frequency: Min 2X/week   Barriers to D/C:            Co-evaluation              AM-PAC OT "6 Clicks" Daily Activity     Outcome Measure Help from another person eating meals?: None Help from another person taking care of personal grooming?: A Little Help from another person toileting, which includes using toliet, bedpan, or urinal?: A Little Help from another person bathing (including washing,  rinsing, drying)?: A Lot Help from another person to put on and taking off regular upper body clothing?: A  Little Help from another person to put on and taking off regular lower body clothing?: A Little 6 Click Score: 18   End of Session Equipment Utilized During Treatment: Rolling walker (2 wheels);Gait belt Nurse Communication: Mobility status  Activity Tolerance: Patient tolerated treatment well Patient left: in chair;with chair alarm set;with call bell/phone within reach  OT Visit Diagnosis: Other abnormalities of gait and mobility (R26.89);Muscle weakness (generalized) (M62.81);Other symptoms and signs involving cognitive function;Pain                Time: 1137-1207 OT Time Calculation (min): 30 min Charges:  OT General Charges $OT Visit: 1 Visit OT Evaluation $OT Eval Low Complexity: 1 Low OT Treatments $Self Care/Home Management : 8-22 mins  Leoni Goodness MSOT, OTR/L Acute Rehab Pager: [phone] Office: [place] 01/19/2021, 1:51 PM

## 2021-01-19 NOTE — Progress Notes (Signed)
Mobility Specialist Progress Note:   01/19/21 1400  Mobility  Activity Transferred:  Chair to bed  Level of Assistance Minimal assist, patient does 75% or more  Assistive Device Front wheel walker  Distance Ambulated (ft) 2 ft  Mobility Ambulated with assistance in room  Mobility Response Tolerated well  Mobility performed by Mobility specialist;Nurse tech  $Mobility charge 1 Mobility   Stated she was dizzy upon arrival. Pt required minA for safety, otherwise independent.   Nelta Numbers Mobility Specialist  Phone [phone]

## 2021-01-19 NOTE — Progress Notes (Signed)
PROGRESS NOTE    Victoria Levine  NMM:[id number] DOB: 25-Sep-1950 DOA: 01/17/2021 PCP: [name], MD   Brief Narrative: 70 year old with past medical history significant for hypothyroidism, asthma, GERD, osteoporosis, anxiety, alcohol use who presents complaining of diarrhea and weakness.  Patient had just been hospitalized from 10/28 until 11/1 for weakness and confusion.  She initially met SIRS criteria and had been treated with empiric antibiotics, but antibiotics were discontinued as no source could be found.  She was found to have multiple electrolytes abnormality in addition to suspected prerenal acute kidney injury secondary to dehydration.  She was treated with IV fluids and her electrolytes improved.  She was also found to have a TSH of 15 and free T4 of 5.5.  Patient presented with worsening diarrhea. C. difficile antigen negative GI pathogen negative.  Diarrhea has improved.  PT has seen her again and is recommending skilled rehab   Assessment & Plan:   Principal Problem:   Diarrhea Active Problems:   Hypothyroidism   Bipolar 1 disorder, depressed (HCC)   Chronic pain   Hypokalemia   Leukocytosis   Transaminitis   Generalized weakness  1-Nausea vomiting and diarrhea acute: C. Diff  rule out Likely related to gastroenteritis. Symptoms  have improved. Continue with supportive care  2-Hypokalemia/ hypomagnesemia; replete IV and oral.    Leukocytosis: trending down.   Generalized weakness: She will benefit from rehab.  She agreed to go to rehab.  Hypothyroidism: Advised patient to stop taking biotin supplements. Repeated TSH: down to 12. Free t4 0.81 Continue with current dose of Synthroid  Chronic pain, chronic opioid use: UDS last hospitalization was negative.  Depression: Continue with Prozac Wellbutrin and Seroquel  Transaminases: Trending down Edema; no focal edema on [place].     Pressure Injury 03/09/20 Sacrum Posterior Stage 1 -  Intact skin with  non-blanchable redness of a localized area usually over a bony prominence. dressing from nursing home in place on the sacrum, but to the right stage 1 noted.  (Active)  03/09/20 1600  Location: Sacrum  Location Orientation: Posterior  Staging: Stage 1 -  Intact skin with non-blanchable redness of a localized area usually over a bony prominence.  Wound Description (Comments): dressing from nursing home in place on the sacrum, but to the right stage 1 noted.   Present on Admission: Yes      Estimated body mass index is 25.28 kg/m as calculated from the following:   Height as of 01/13/21: 5' 5" (1.651 m).   Weight as of 01/13/21: 68.9 kg.   DVT prophylaxis: Lovenox Code Status: DNR Family Communication: Discussed with patient Disposition Plan:  Status is: Observation  The patient remains OBS appropriate and will d/c before 2 midnights.       Consultants:  None  Procedures:    Antimicrobials:    Subjective: She reported improvement of diarrhea, no diarrhea so far this morning.  Denies abdominal pain.  She is to feel weak.  She is willing to go to rehab  Objective: Vitals:   01/18/21 1039 01/18/21 1258 01/18/21 1525 01/18/21 1719  BP: (!) 190/111 (!) 163/96 (!) 157/89 (!) 149/97  Pulse: 88 85 75 81  Resp: _0 Temp: 97.7 F (36.5 C)  97.9 F (36.6 C) 97.9 F (36.6 C)  TempSrc: Oral  Oral Oral  SpO2: 98% 96% 96% 97%    Intake/Output Summary (Last 24 hours) at 01/19/2021 0806 Last data filed at 01/19/2021 0450 Gross per 24 hour  Intake 2660.1 ml  Output 200 ml  Net 2460.1 ml   There were no vitals filed for this visit.  Examination:  General exam: Appears calm and comfortable  Respiratory system: Clear to auscultation. Respiratory effort normal. Cardiovascular system: S1 & S2 heard, RRR. No JVD, murmurs, rubs, gallops or clicks. No pedal edema. Gastrointestinal system: Abdomen is nondistended, soft and nontender. No organomegaly or masses felt.  Normal bowel sounds heard. Central nervous system: Alert and oriented. No focal neurological deficits. Extremities: Symmetric 5 x 5 power.  Data Reviewed: I have personally reviewed following labs and imaging studies  CBC: Recent Labs  Lab 01/13/21 1418 01/13/21 1702 01/15/21 0806 01/16/21 0525 01/17/21 0501 01/17/21 2353 01/19/21 0037  WBC 21.6*   < > 11.1* 10.6* 12.1* 14.2* 12.6*  NEUTROABS 18.9*  --   --   --   --  11.5*  --   HGB 19.3*   < > 11.6* 12.3 12.4 14.2 12.2  HCT 58.7*   < > 36.0 38.6 38.4 42.6 36.2  MCV 93.8   < > 96.0 97.0 96.0 94.0 91.9  PLT 438*   < > 207 223 234 253 269   < > = values in this interval not displayed.   Basic Metabolic Panel: Recent Labs  Lab 01/14/21 0722 01/14/21 1013 01/15/21 0806 01/15/21 0831 01/16/21 0525 01/17/21 0501 01/17/21 2353 01/18/21 1220 01/18/21 1313 01/19/21 0037  NA  --    < >  --    < > 146* 140 135 136  --  134*  K  --    < >  --    < > 3.2* 3.0* 2.9* 3.5  --  3.2*  CL  --    < >  --    < > 116* 109 102 103  --  105  CO2  --    < >  --    < > 24 23 22 24  --  24  GLUCOSE  --    < >  --    < > 100* 76 84 72  --  78  BUN  --    < >  --    < > 46* 21 14 13  --  14  CREATININE  --    < >  --    < > 1.34* 1.06* 0.90 0.88  --  0.88  CALCIUM  --    < >  --    < > 9.1 8.8* 8.9 8.7*  --  8.2*  MG 2.4  --  2.1  --  1.8 1.3*  --   --  1.9 1.5*  PHOS 6.2*  --   --   --   --   --   --   --   --   --    < > = values in this interval not displayed.   GFR: Estimated Creatinine Clearance: 57.2 mL/min (by C-G formula based on SCr of 0.88 mg/dL). Liver Function Tests: Recent Labs  Lab 01/13/21 1418 01/15/21 0831 01/16/21 0525 01/17/21 0501 01/17/21 2353  AST 148* 59* 35 30 33  ALT 374* 162* 117* 82* 76*  ALKPHOS 136* 69 77 76 82  BILITOT 2.5* 1.1 0.5 0.6 0.8  PROT 7.9 4.7* 5.0* 4.8* 5.3*  ALBUMIN 4.1 2.4* 2.3* 2.3* 2.6*   No results for input(s): LIPASE, AMYLASE in the last 168 hours. No results for input(s): AMMONIA  in the last 168 hours. Coagulation Profile: Recent Labs  Lab   01/13/21 1418 01/13/21 1630 01/14/21 0722  INR 1.2 1.2 1.3*   Cardiac Enzymes: Recent Labs  Lab 01/13/21 1424 01/15/21 0806  CKTOTAL 29* 100   BNP (last 3 results) No results for input(s): PROBNP in the last 8760 hours. HbA1C: No results for input(s): HGBA1C in the last 72 hours. CBG: Recent Labs  Lab 01/16/21 1136 01/16/21 2135 01/17/21 0007 01/17/21 0558 01/17/21 1134  GLUCAP 101* 108* 88 74 81   Lipid Profile: No results for input(s): CHOL, HDL, LDLCALC, TRIG, CHOLHDL, LDLDIRECT in the last 72 hours. Thyroid Function Tests: Recent Labs    01/18/21 1313  TSH 12.822*  FREET4 0.81   Anemia Panel: No results for input(s): VITAMINB12, FOLATE, FERRITIN, TIBC, IRON, RETICCTPCT in the last 72 hours. Sepsis Labs: Recent Labs  Lab 01/13/21 1525 01/13/21 1715 01/14/21 0722 01/15/21 1031  PROCALCITON  --   --  0.20  --   LATICACIDVEN 4.5* 3.6* 3.1* 0.8    Recent Results (from the past 240 hour(s))  Resp Panel by RT-PCR (Flu A&B, Covid) Nasopharyngeal Swab     Status: None   Collection Time: 01/13/21  2:18 PM   Specimen: Nasopharyngeal Swab; Nasopharyngeal(NP) swabs in vial transport medium  Result Value Ref Range Status   SARS Coronavirus 2 by RT PCR NEGATIVE NEGATIVE Final    Comment: (NOTE) SARS-CoV-2 target nucleic acids are NOT DETECTED.  The SARS-CoV-2 RNA is generally detectable in upper respiratory specimens during the acute phase of infection. The lowest concentration of SARS-CoV-2 viral copies this assay can detect is 138 copies/mL. A negative result does not preclude SARS-Cov-2 infection and should not be used as the sole basis for treatment or other patient management decisions. A negative result may occur with  improper specimen collection/handling, submission of specimen other than nasopharyngeal swab, presence of viral mutation(s) within the areas targeted by this assay, and  inadequate number of viral copies(<138 copies/mL). A negative result must be combined with clinical observations, patient history, and epidemiological information. The expected result is Negative.  Fact Sheet for Patients:  https://www.fda.gov/media/152166/download  Fact Sheet for Healthcare Providers:  https://www.fda.gov/media/152162/download  This test is no t yet approved or cleared by the [place] FDA and  has been authorized for detection and/or diagnosis of SARS-CoV-2 by FDA under an Emergency Use Authorization (EUA). This EUA will remain  in effect (meaning this test can be used) for the duration of the COVID-19 declaration under Section 564(b)(1) of the Act, 21 U.S.C.section 360bbb-3(b)(1), unless the authorization is terminated  or revoked sooner.       Influenza A by PCR NEGATIVE NEGATIVE Final   Influenza B by PCR NEGATIVE NEGATIVE Final    Comment: (NOTE) The Xpert Xpress SARS-CoV-2/FLU/RSV plus assay is intended as an aid in the diagnosis of influenza from Nasopharyngeal swab specimens and should not be used as a sole basis for treatment. Nasal washings and aspirates are unacceptable for Xpert Xpress SARS-CoV-2/FLU/RSV testing.  Fact Sheet for Patients: https://www.fda.gov/media/152166/download  Fact Sheet for Healthcare Providers: https://www.fda.gov/media/152162/download  This test is not yet approved or cleared by the [place] FDA and has been authorized for detection and/or diagnosis of SARS-CoV-2 by FDA under an Emergency Use Authorization (EUA). This EUA will remain in effect (meaning this test can be used) for the duration of the COVID-19 declaration under Section 564(b)(1) of the Act, 21 U.S.C. section 360bbb-3(b)(1), unless the authorization is terminated or revoked.  Performed at [facility], [address] [place], [place] [address]   Blood   Culture (routine x 2)     Status: None   Collection Time: 01/13/21  2:50 PM    Specimen: BLOOD  Result Value Ref Range Status   Specimen Description BLOOD LEFT ANTECUBITAL  Final   Special Requests   Final    BOTTLES DRAWN AEROBIC AND ANAEROBIC Blood Culture adequate volume   Culture   Final    NO GROWTH 5 DAYS Performed at [facility], [address] [place], [place] [address]    Report Status 01/18/2021 FINAL  Final  Urine Culture     Status: None   Collection Time: 01/13/21  5:29 PM   Specimen: In/Out Cath Urine  Result Value Ref Range Status   Specimen Description IN/OUT CATH URINE  Final   Special Requests NONE  Final   Culture   Final    NO GROWTH Performed at [facility], [address] [place], [place] [address]    Report Status 01/14/2021 FINAL  Final  Blood Culture (routine x 2)     Status: None   Collection Time: 01/14/21  8:22 AM   Specimen: BLOOD  Result Value Ref Range Status   Specimen Description BLOOD LEFT ANTECUBITAL  Final   Special Requests   Final    BOTTLES DRAWN AEROBIC AND ANAEROBIC Blood Culture adequate volume   Culture   Final    NO GROWTH 5 DAYS Performed at [facility], [address] [place], [place] [address]    Report Status 01/19/2021 FINAL  Final  Resp Panel by RT-PCR (Flu A&B, Covid) Nasopharyngeal Swab     Status: None   Collection Time: 01/18/21  6:33 AM   Specimen: Nasopharyngeal Swab; Nasopharyngeal(NP) swabs in vial transport medium  Result Value Ref Range Status   SARS Coronavirus 2 by RT PCR NEGATIVE NEGATIVE Final    Comment: (NOTE) SARS-CoV-2 target nucleic acids are NOT DETECTED.  The SARS-CoV-2 RNA is generally detectable in upper respiratory specimens during the acute phase of infection. The lowest concentration of SARS-CoV-2 viral copies this assay can detect is 138 copies/mL. A negative result does not preclude SARS-Cov-2 infection and should not be used as the sole basis for treatment or other patient management decisions. A negative result may occur with  improper specimen  collection/handling, submission of specimen other than nasopharyngeal swab, presence of viral mutation(s) within the areas targeted by this assay, and inadequate number of viral copies(<138 copies/mL). A negative result must be combined with clinical observations, patient history, and epidemiological information. The expected result is Negative.  Fact Sheet for Patients:  https://www.fda.gov/media/152166/download  Fact Sheet for Healthcare Providers:  https://www.fda.gov/media/152162/download  This test is no t yet approved or cleared by the [place] FDA and  has been authorized for detection and/or diagnosis of SARS-CoV-2 by FDA under an Emergency Use Authorization (EUA). This EUA will remain  in effect (meaning this test can be used) for the duration of the COVID-19 declaration under Section 564(b)(1) of the Act, 21 U.S.C.section 360bbb-3(b)(1), unless the authorization is terminated  or revoked sooner.       Influenza A by PCR NEGATIVE NEGATIVE Final   Influenza B by PCR NEGATIVE NEGATIVE Final    Comment: (NOTE) The Xpert Xpress SARS-CoV-2/FLU/RSV plus assay is intended as an aid in the diagnosis of influenza from Nasopharyngeal swab specimens and should not be used as a sole basis for treatment. Nasal washings and aspirates are unacceptable for Xpert Xpress SARS-CoV-2/FLU/RSV testing.  Fact Sheet for Patients: https://www.fda.gov/media/152166/download    Fact Sheet for Healthcare Providers: https://www.fda.gov/media/152162/download  This test is not yet approved or cleared by the [place] FDA and has been authorized for detection and/or diagnosis of SARS-CoV-2 by FDA under an Emergency Use Authorization (EUA). This EUA will remain in effect (meaning this test can be used) for the duration of the COVID-19 declaration under Section 564(b)(1) of the Act, 21 U.S.C. section 360bbb-3(b)(1), unless the authorization is terminated or revoked.  Performed at [facility], [address] [place], [place] [address]   C Difficile Quick Screen w PCR reflex     Status: None   Collection Time: 01/18/21  1:09 PM   Specimen: Stool  Result Value Ref Range Status   C Diff antigen NEGATIVE NEGATIVE Final   C Diff toxin NEGATIVE NEGATIVE Final   C Diff interpretation No C. difficile detected.  Final    Comment: Performed at [facility], [address] [place], [place] [address]         Radiology Studies: DG ABD ACUTE 2+V W 1V CHEST  Result Date: 01/19/2021 CLINICAL DATA:  Abdominal pain EXAM: DG ABDOMEN ACUTE WITH 1 VIEW CHEST COMPARISON:  CT abdomen/pelvis dated 01/05/2021 FINDINGS: Large hernia at the right lung base. Bilateral lower lobe atelectasis. Lungs are otherwise clear.  No pleural effusion or pneumothorax. The heart is normal in size. Nonobstructive bowel gas pattern. No evidence of free air under the diaphragm on the upright view. Lumbar spine fixation hardware with prior vertebral augmentation. IMPRESSION: Negative abdominal radiographs.  No acute cardiopulmonary disease. Electronically Signed   By: Sriyesh  Krishnan M.D.   On: 01/19/2021 01:32   US LT UPPER EXTREM LTD SOFT TISSUE NON VASCULAR  Result Date: 01/18/2021 CLINICAL DATA:  Left arm swelling EXAM: ULTRASOUND LEFT UPPER EXTREMITY LIMITED TECHNIQUE: Ultrasound examination of the upper extremity soft tissues was performed in the area of clinical concern. COMPARISON:  None. FINDINGS: Dedicated grayscale and color Doppler sonography was performed within the left antecubital fossa in the area of focal swelling. No abnormal subcutaneous soft tissue mass, fluid collection, or calcification is seen within this region. IMPRESSION: No sonographic correlate for the patient's reported focal swelling Electronically Signed   By: Ashesh  Parikh M.D.   On: 01/18/2021 19:39        Scheduled Meds:  buPROPion  150 mg Oral BID   enoxaparin (LOVENOX) injection  40 mg Subcutaneous Q24H    famotidine  20 mg Oral QHS   FLUoxetine  20 mg Oral BID   levothyroxine  112 mcg Oral QAC breakfast   oxcarbazepine  600 mg Oral BID   oxyCODONE  10 mg Oral Q12H   QUEtiapine  400 mg Oral QPM   sodium chloride flush  3 mL Intravenous Q12H   Continuous Infusions:  0.45 % NaCl with KCl 20 mEq / L 75 mL/hr at 01/18/21 1740     LOS: 0 days    Time spent: 35 minutes     A , MD [facility]   If 7PM-7AM, please contact night-coverage [email or web address]  01/19/2021, 8:06 AM   

## 2021-01-19 NOTE — NC FL2 (Signed)
[place] LEVEL OF CARE SCREENING TOOL     IDENTIFICATION  Patient Name: Victoria Levine Birthdate: Oct 14, 1950 Sex: female Admission Date (Current Location): 01/17/2021  [facility] and [place] Number:  Herbalist and Address:  [facility], [place] [address] [place], [place] [address]      Provider Number: [id number]  Attending Physician Name and Address:  Elmarie Shiley, MD  Relative Name and Phone Number:       Current Level of Care: Hospital Recommended Level of Care: [place] Prior Approval Number:    Date Approved/Denied:   PASRR Number: pending level 2  Discharge Plan: SNF    Current Diagnoses: Patient Active Problem List   Diagnosis Date Noted   Diarrhea 01/18/2021   Generalized weakness 01/18/2021   Lactic acidosis 01/14/2021   Sepsis ([place]) 01/14/2021   Hypercalcemia 01/14/2021   Leukocytosis 01/14/2021   Thrombocytosis 01/14/2021   Dehydration 01/14/2021   Transaminitis 01/14/2021   Acute kidney injury ([place]) 01/13/2021   Encephalopathy 03/09/2020   Pressure injury of skin 01/60/1093   Acute metabolic encephalopathy 23/55/7322   Displaced intertrochanteric fracture of left femur, initial encounter for closed fracture ([place]) 02/23/2020   Hypokalemia 02/23/2020   Intractable nausea and vomiting 08/31/2019   Epigastric pain 08/31/2019   Alcohol abuse 08/31/2019   Microcytic anemia 08/31/2019   Closed compression fracture of L2 vertebra ([place]) 12/11/2017   Chronic pain 07/26/2015   Anxiety state 06/28/2015   Benzodiazepine dependence ([place]) 10/13/2014   Opioid abuse with opioid-induced mood disorder ([place]) 10/13/2014   Bipolar 1 disorder, depressed ([place]) 10/12/2014   Spondylolisthesis of lumbar region 07/28/2014   Osteoporosis 11/17/2013   Migraines    Herpes    Hypothyroidism 08/13/2008   HYPERCHOLESTEROLEMIA 08/13/2008   Bipolar disorder ([place]) 08/13/2008   Asthma-chronic obstructive  pulmonary disease overlap syndrome ([place]) 08/13/2008   GERD (gastroesophageal reflux disease) 08/13/2008   HIATAL HERNIA 08/13/2008   ALLERGY 08/13/2008    Orientation RESPIRATION BLADDER Height & Weight     Self, Time, Situation, Place  Normal Continent Weight:   Height:     BEHAVIORAL SYMPTOMS/MOOD NEUROLOGICAL BOWEL NUTRITION STATUS      Continent Diet  AMBULATORY STATUS COMMUNICATION OF NEEDS Skin   Extensive Assist Verbally Normal                       Personal Care Assistance Level of Assistance  Bathing, Feeding, Dressing Bathing Assistance: Maximum assistance Feeding assistance: Limited assistance Dressing Assistance: Maximum assistance     Functional Limitations Info             SPECIAL CARE FACTORS FREQUENCY  PT (By licensed PT), OT (By licensed OT)     PT Frequency: 5x a week OT Frequency: 5x a week            Contractures Contractures Info: Not present    Additional Factors Info  Code Status, Allergies Code Status Info: DNR Allergies Info: Prednisone   Lithium Carbonate   Other   Sulfa Antibiotics           Current Medications (01/19/2021):  This is the current hospital active medication list Current Facility-Administered Medications  Medication Dose Route Frequency Provider Last Rate Last Admin   acetaminophen (TYLENOL) tablet 650 mg  650 mg Oral Q6H PRN Fuller Plan A, MD       Or   acetaminophen (TYLENOL) suppository 650 mg  650 mg Rectal Q6H PRN Tamala Julian,  Rondell A, MD       albuterol (PROVENTIL) (2.5 MG/3ML) 0.083% nebulizer solution 2.5 mg  2.5 mg Nebulization Q6H PRN Tamala Julian, Rondell A, MD       buPROPion (WELLBUTRIN XL) 24 hr tablet 150 mg  150 mg Oral BID Tamala Julian, Rondell A, MD   150 mg at 01/19/21 0840   enoxaparin (LOVENOX) injection 40 mg  40 mg Subcutaneous Q24H Smith, Rondell A, MD   40 mg at 01/19/21 1211   famotidine (PEPCID) tablet 20 mg  20 mg Oral QHS Smith, Rondell A, MD   20 mg at 01/18/21 2241   FLUoxetine (PROZAC) capsule  20 mg  20 mg Oral BID Fuller Plan A, MD   20 mg at 01/19/21 0840   gabapentin (NEURONTIN) capsule 600 mg  600 mg Oral TID PRN Fuller Plan A, MD       hydrALAZINE (APRESOLINE) injection 10 mg  10 mg Intravenous Q4H PRN Norval Morton, MD       levothyroxine (SYNTHROID) tablet 112 mcg  112 mcg Oral QAC breakfast Fuller Plan A, MD   112 mcg at 01/19/21 3810   loperamide (IMODIUM) capsule 2 mg  2 mg Oral PRN Fuller Plan A, MD       magnesium sulfate IVPB 2 g 50 mL  2 g Intravenous Once Regalado, Belkys A, MD 50 mL/hr at 01/19/21 1603 2 g at 01/19/21 1603   ondansetron (ZOFRAN) tablet 4 mg  4 mg Oral Q6H PRN Norval Morton, MD       Or   ondansetron (ZOFRAN) injection 4 mg  4 mg Intravenous Q6H PRN Norval Morton, MD       Oxcarbazepine (TRILEPTAL) tablet 600 mg  600 mg Oral BID Fuller Plan A, MD   600 mg at 01/19/21 1207   oxyCODONE (Oxy IR/ROXICODONE) immediate release tablet 10 mg  10 mg Oral BID PRN Norval Morton, MD       oxyCODONE (OXYCONTIN) 12 hr tablet 10 mg  10 mg Oral Q12H Smith, Rondell A, MD   10 mg at 01/19/21 0840   potassium chloride (KLOR-CON) packet 20 mEq  20 mEq Oral Once Regalado, Belkys A, MD       QUEtiapine (SEROQUEL) tablet 400 mg  400 mg Oral QPM Smith, Rondell A, MD       sodium chloride flush (NS) 0.9 % injection 3 mL  3 mL Intravenous Q12H Smith, Rondell A, MD   3 mL at 01/18/21 1058   SUMAtriptan (IMITREX) tablet 25 mg  25 mg Oral Daily PRN Norval Morton, MD         Discharge Medications: Please see discharge summary for a list of discharge medications.  Relevant Imaging Results:  Relevant Lab Results:   Additional Information FBP:[id number]  Emeterio Reeve, [place]

## 2021-01-20 DIAGNOSIS — R197 Diarrhea, unspecified: Secondary | ICD-10-CM | POA: Diagnosis not present

## 2021-01-20 LAB — BASIC METABOLIC PANEL
Anion gap: 6 (ref 5–15)
BUN: 10 mg/dL (ref 8–23)
CO2: 26 mmol/L (ref 22–32)
Calcium: 8.5 mg/dL — ABNORMAL LOW (ref 8.9–10.3)
Chloride: 102 mmol/L (ref 98–111)
Creatinine, Ser: 0.81 mg/dL (ref 0.44–1.00)
GFR, Estimated: >60 mL/min (ref >60)
Glucose, Bld: 146 mg/dL — ABNORMAL HIGH (ref 70–99)
Potassium: 3.5 mmol/L (ref 3.5–5.1)
Sodium: 134 mmol/L — ABNORMAL LOW (ref 135–145)

## 2021-01-20 LAB — MAGNESIUM: Magnesium: 1.8 mg/dL (ref 1.7–2.4)

## 2021-01-20 LAB — BASIC METABOLIC PANEL WITH GFR
Anion gap: 4 — ABNORMAL LOW (ref 5–15)
BUN: 10 mg/dL (ref 8–23)
CO2: 28 mmol/L (ref 22–32)
Calcium: 8.5 mg/dL — ABNORMAL LOW (ref 8.9–10.3)
Chloride: 104 mmol/L (ref 98–111)
Creatinine, Ser: 0.81 mg/dL (ref 0.44–1.00)
GFR, Estimated: >60 mL/min (ref >60)
Glucose, Bld: 83 mg/dL (ref 70–99)
Potassium: 2.9 mmol/L — ABNORMAL LOW (ref 3.5–5.1)
Sodium: 136 mmol/L (ref 135–145)

## 2021-01-20 LAB — RESP PANEL BY RT-PCR (FLU A&B, COVID) ARPGX2
Influenza A by PCR: NEGATIVE
Influenza B by PCR: NEGATIVE
SARS Coronavirus 2 by RT PCR: NEGATIVE

## 2021-01-20 MED ORDER — LEVOTHYROXINE SODIUM 125 MCG PO TABS: 125.0000 ug | ORAL_TABLET | Freq: Every day | ORAL | 1 refills | Status: DC

## 2021-01-20 MED ORDER — POTASSIUM CHLORIDE 20 MEQ PO PACK
40.0000 meq | PACK | Freq: Two times a day (BID) | ORAL | Status: AC
  Administered 2021-01-20 (×2): 40 meq via ORAL
  Filled 2021-01-20 (×2): qty 2

## 2021-01-20 MED ORDER — POTASSIUM CHLORIDE CRYS ER 20 MEQ PO TBCR
40.0000 meq | EXTENDED_RELEASE_TABLET | Freq: Once | ORAL | Status: AC
  Administered 2021-01-20: 40 meq via ORAL
  Filled 2021-01-20: qty 2

## 2021-01-20 MED ORDER — POTASSIUM CHLORIDE 20 MEQ PO PACK: 40.0000 meq | PACK | Freq: Two times a day (BID) | ORAL | 0 refills | Status: DC

## 2021-01-20 NOTE — Progress Notes (Signed)
Physical Therapy Treatment Patient Details Name: Victoria Levine MRN: [id number] DOB: [birth year] Today's Date: 01/20/2021   History of Present Illness 71 y/o female admitted secondary to diarrhea and inability to care for herself at home on 11/1 after being discharged the same day. Pt with recent admission for AKI and rhabdomyolysis. PMH includes alcohol abuse, bipolar disorder, osteoporosis and anxiety.    PT Comments    Pt admitted with above diagnosis. Pt self limiting.  Difficult to get pt to sit up much less OOB to chair. Was able to encourage OOB and she took a few steps to the chair with min to mod asssist overall for pt safety.  She is impulsive and needs constant assist and cues with mobility.  Pt currently with functional limitations due to balance and endruance deficits. Pt will benefit from skilled PT to increase their independence and safety with mobility to allow discharge to the venue listed below.      Recommendations for follow up therapy are one component of a multi-disciplinary discharge planning process, led by the attending physician.  Recommendations may be updated based on patient status, additional functional criteria and insurance authorization.  Follow Up Recommendations  Skilled nursing-short term rehab (<3 hours/day)     Assistance Recommended at Discharge Frequent or constant Supervision/Assistance  Equipment Recommendations  Wheelchair (measurements PT);Wheelchair cushion (measurements PT);Hospital bed    Recommendations for Other Services       Precautions / Restrictions Precautions Precautions: Fall Restrictions Weight Bearing Restrictions: No     Mobility  Bed Mobility Overal bed mobility: Needs Assistance Bed Mobility: Supine to Sit Rolling: Min guard;Min assist (Min A to roll in attempt to sit up (multiple x as pt returned to supine and then rolled again)) Sidelying to sit: Min assist;Min guard (didn't want to sit up on side of bed) Supine  to sit: Min guard;HOB elevated Sit to supine: Supervision Sit to sidelying:  (wanted to lay back down) General bed mobility comments: Min Guard A for safety with pt needing assist at times more for encouragement and directional cues.  Pt not wanting to get up and kept lying back down.    Transfers Overall transfer level: Needs assistance Equipment used: Rolling walker (2 wheels) Transfers: Sit to/from [facility] Sit to Stand: Mod assist Stand pivot transfers: Min assist         General transfer comment: Min A for standing balance as pt tooke incr time to talk into even sitting EOB.  Pt would not stand to RW therefore PT allowed her to hold onto PT and stand up holding to PT.  Pt leaning on PT at times but was able to take a few pivotal steps to chair.  Her lunch was delivered and pt was excited to eat her salad.    Ambulation/Gait                 Stairs             Wheelchair Mobility    Modified Rankin (Stroke Patients Only)       Balance Overall balance assessment: Needs assistance Sitting-balance support: No upper extremity supported Sitting balance-Leahy Scale: Good     Standing balance support: During functional activity;Reliant on assistive device for balance Standing balance-Leahy Scale: Poor Standing balance comment: Reliant on UE support as well as external support.  Cognition Arousal/Alertness: Awake/alert Behavior During Therapy: WFL for tasks assessed/performed Overall Cognitive Status: No family/caregiver present to determine baseline cognitive functioning Area of Impairment: Safety/judgement;Memory;Attention;Following commands;Awareness;Problem solving                 Orientation Level: Time (November) Current Attention Level: Sustained Memory: Decreased short-term memory Following Commands: Follows one step commands with increased time Safety/Judgement: Decreased awareness of  deficits Awareness: Intellectual Problem Solving: Requires verbal cues;Requires tactile cues;Slow processing;Decreased initiation;Difficulty sequencing General Comments: Baseline bipolar disorder. Pt's goal is to return home to care for pet cat Pt tangiental in conversation and impulsive.        Exercises      General Comments        Pertinent Vitals/Pain Pain Assessment: No/denies pain    Home Living                          Prior Function            PT Goals (current goals can now be found in the care plan section) Acute Rehab PT Goals Patient Stated Goal: to be able to walk Progress towards PT goals: Progressing toward goals    Frequency    Min 2X/week      PT Plan Current plan remains appropriate    Co-evaluation              AM-PAC PT "6 Clicks" Mobility   Outcome Measure  Help needed turning from your back to your side while in a flat bed without using bedrails?: A Little Help needed moving from lying on your back to sitting on the side of a flat bed without using bedrails?: A Little Help needed moving to and from a bed to a chair (including a wheelchair)?: A Lot Help needed standing up from a chair using your arms (e.g., wheelchair or bedside chair)?: A Lot Help needed to walk in hospital room?: Total Help needed climbing 3-5 steps with a railing? : Total 6 Click Score: 12    End of Session Equipment Utilized During Treatment: Gait belt Activity Tolerance: Patient limited by fatigue (self limting) Patient left: with call bell/phone within reach;in chair;with chair alarm set Nurse Communication: Mobility status PT Visit Diagnosis: Unsteadiness on feet (R26.81);Difficulty in walking, not elsewhere classified (R26.2);History of falling (Z91.81);Repeated falls (R29.6);Muscle weakness (generalized) (M62.81)     Time: 1246-1300 PT Time Calculation (min) (ACUTE ONLY): 14 min  Charges:  $Therapeutic Activity: 8-22 mins                      Annasofia Pohl M,PT Acute Rehab Services [phone] (pager)    Alvira Philips 01/20/2021, 3:22 PM

## 2021-01-20 NOTE — Discharge Summary (Signed)
Physician Discharge Summary  Victoria Levine QTM:[id number] DOB: [birth year] DOA: 01/17/2021  PCP: [name], MD  Admit date: 01/17/2021 Discharge date: 01/20/2021  Admitted From:  Home  Disposition:  SNF  Recommendations for Outpatient Follow-up:  Follow up with PCP in 1-2 weeks Please obtain BMP/CBC in one week Needs follow up with Endocrinologist in 2 -4 weeks.  Monitor K level. Replete as needed.    Discharge Condition: Stable.  CODE STATUS: DNR Diet recommendation: Heart Healthy   Brief/Interim Summary:   1-Nausea vomiting and diarrhea acute: C. Diff  rule out Likely related to gastroenteritis. Symptoms  have improved. Continue with supportive care.   2-Hypokalemia/ hypomagnesemia; replete orally. Discharge on supplement.      Leukocytosis: trending down.    Generalized weakness: She will benefit from rehab.  She agreed to go to rehab.   Hypothyroidism: Advised patient to stop taking biotin supplements. Repeated TSH: down to 12. Free t4 0.81 She has been compliant with her medication. Plan to increase synthroid to 125 mcg.    Chronic pain, chronic opioid use: UDS last hospitalization was negative.   Depression: Continue with Prozac Wellbutrin and Seroquel   Transaminases: Trending down Edema; no focal edema on [place].    Discharge Diagnoses:  Principal Problem:   Diarrhea Active Problems:   Hypothyroidism   Bipolar 1 disorder, depressed (HCC)   Chronic pain   Hypokalemia   Leukocytosis   Transaminitis   Generalized weakness    Discharge Instructions  Discharge Instructions     Diet - low sodium heart healthy   Complete by: As directed    Increase activity slowly   Complete by: As directed       Allergies as of 01/20/2021       Reactions   Prednisone Other (See Comments)   Cannot tolerate ORALLY, but can tolerate by shot (interacts with anxiety & bipolarity)   Lithium Carbonate Other (See Comments)   Patient was taken off of this  because of a weight gain of 40 pounds   Other Other (See Comments)   Serious reaction to 1st COVID vaccination - unknown type   Sulfa Antibiotics Other (See Comments)   From childhood; reaction not recalled        Medication List     TAKE these medications    albuterol 108 (90 Base) MCG/ACT inhaler Commonly known as: VENTOLIN HFA Inhale 2 puffs into the lungs every 6 (six) hours as needed for wheezing or shortness of breath.   albuterol (2.5 MG/3ML) 0.083% nebulizer solution Commonly known as: PROVENTIL Take 2.5 mg by nebulization every 6 (six) hours as needed for wheezing or shortness of breath.   buPROPion 150 MG 24 hr tablet Commonly known as: WELLBUTRIN XL Take 150 mg by mouth 2 (two) times daily.   EPINEPHrine 0.3 mg/0.3 mL Soaj injection Commonly known as: EPI-PEN Inject 0.3 mg into the skin daily as needed for anaphylaxis.   famotidine 20 MG tablet Commonly known as: PEPCID Take 1 tablet (20 mg total) by mouth at bedtime.   FLUoxetine 20 MG capsule Commonly known as: PROZAC Take 20 mg by mouth 2 (two) times daily.   gabapentin 600 MG tablet Commonly known as: NEURONTIN Take 600 mg by mouth 3 (three) times daily as needed (pain).   levothyroxine 125 MCG tablet Commonly known as: SYNTHROID Take 1 tablet (125 mcg total) by mouth daily before breakfast. What changed:  medication strength how much to take   loperamide 2 MG capsule Commonly known as:  IMODIUM Take 2 capsules (4 mg total) by mouth as needed for diarrhea or loose stools.   multivitamin with minerals Tabs tablet Take 1 tablet by mouth daily.   naproxen sodium 220 MG tablet Commonly known as: ALEVE Take 440 mg by mouth every 12 (twelve) hours as needed (pain).   oxcarbazepine 600 MG tablet Commonly known as: TRILEPTAL Take 600 mg by mouth 2 (two) times daily.   Oxycodone HCl 10 MG Tabs Take 1 tablet (10 mg total) by mouth every 4 (four) hours as needed (for breakthrough pain). What  changed:  when to take this reasons to take this   potassium chloride 20 MEQ packet Commonly known as: KLOR-CON Take 40 mEq by mouth 2 (two) times daily for 5 days.   QUEtiapine 400 MG tablet Commonly known as: SEROQUEL Take 400 mg by mouth every evening.   rizatriptan 10 MG disintegrating tablet Commonly known as: MAXALT-MLT Take 10 mg by mouth daily as needed for migraine.   Xtampza ER 9 MG C12a Generic drug: oxyCODONE ER Take 9 mg by mouth 2 (two) times daily.        Allergies  Allergen Reactions   Prednisone Other (See Comments)    Cannot tolerate ORALLY, but can tolerate by shot (interacts with anxiety & bipolarity)   Lithium Carbonate Other (See Comments)    Patient was taken off of this because of a weight gain of 40 pounds   Other Other (See Comments)    Serious reaction to 1st COVID vaccination - unknown type   Sulfa Antibiotics Other (See Comments)    From childhood; reaction not recalled    Consultations: None   Procedures/Studies: CT ABDOMEN PELVIS WO CONTRAST  Result Date: 01/13/2021 CLINICAL DATA:  Sepsis EXAM: CT ABDOMEN AND PELVIS WITHOUT CONTRAST TECHNIQUE: Multidetector CT imaging of the abdomen and pelvis was performed following the standard protocol without IV contrast. COMPARISON:  Virtual colonoscopy dated 11/17/2019 FINDINGS: Lower chest: Although not ordered, almost the entire chest is within the field of view. No evidence of pneumonia. Mild compressive atelectasis in the medial right middle lobe. No pleural effusion or pneumothorax. Large fat containing anterior hernia along the right anterior aspect of the heart. The heart is normal in size. No pericardial effusion. Mild atherosclerotic calcifications of the arch. Hepatobiliary: Unenhanced liver is unremarkable. Gallbladder is unremarkable. No intrahepatic or extrahepatic ductal dilatation. Pancreas: Within normal limits. Spleen: Within normal limits. Adrenals/Urinary Tract: Adrenal glands are  within normal limits. Kidneys are within normal limits. No renal calculi or hydronephrosis. Bladder decompressed by an indwelling Foley catheter with trace gas. Stomach/Bowel: Stomach is notable for a moderate hiatal hernia/intrathoracic stomach. No evidence of bowel obstruction. Normal appendix (series 8/image 4). Mild sigmoid diverticulosis, without evidence of diverticulitis. Vascular/Lymphatic: No evidence of abdominal aortic aneurysm. Atherosclerotic calcifications of the abdominal aorta and branch vessels. No suspicious abdominopelvic lymphadenopathy. Reproductive: Uterus is within normal limits. Bilateral ovaries are unremarkable. Other: No abdominopelvic ascites. Musculoskeletal: Severe compression fracture deformity at T3 with mild retropulsion, age indeterminate. Mild superior endplate compression fracture deformity at T8, likely chronic. Prior vertebral augmentation at T12 and L2. Status post PLIF at L3-5. Degenerative changes of the lumbar spine. Left hip arthroplasty. IMPRESSION: No evidence of bowel obstruction. Normal appendix. Mild sigmoid diverticulosis, without evidence of diverticulitis. Bladder decompressed by an indwelling Foley catheter. No evidence of pneumonia. Moderate hiatal hernia.  Large anterior hernia with fat. Severe compression fracture deformity at T3 with mild retropulsion, age indeterminate. Electronically Signed   By: Bertis Ruddy  [place] Pink M.D.   On: 01/13/2021 20:07   CT Head Wo Contrast  Result Date: 01/13/2021 CLINICAL DATA:  Head trauma EXAM: CT HEAD WITHOUT CONTRAST TECHNIQUE: Contiguous axial images were obtained from the base of the skull through the vertex without intravenous contrast. COMPARISON:  CT brain 01/04/2021, 03/02/2020, 02/23/2020 FINDINGS: Brain: No acute territorial infarction, hemorrhage or intracranial mass is visualized. Atrophy and mild chronic small vessel ischemic changes of the white matter. Small chronic infarcts in the cerebellum. Stable left  posterior parafalcine round calcification likely calcified meningioma. Stable ventricle size. Vascular: No hyperdense vessels.  Carotid vascular calcification Skull: No fracture. Stable 11 mm intra osseous ground-glass lesion within the left frontal bone, felt benign. Sinuses/Orbits: No acute finding. Other: None IMPRESSION: 1. No CT evidence for acute intracranial abnormality. 2. Atrophy and mild chronic small vessel ischemic changes of the white matter Electronically Signed   By: Donavan Foil M.D.   On: 01/13/2021 16:12   CT HEAD WO CONTRAST (5MM)  Result Date: 01/04/2021 CLINICAL DATA:  [place] 2 days ago with trauma to the head and face. EXAM: CT HEAD WITHOUT CONTRAST TECHNIQUE: Contiguous axial images were obtained from the base of the skull through the vertex without intravenous contrast. COMPARISON:  03/09/2020 FINDINGS: Brain: Mild age related volume loss. No evidence of old or acute focal infarction, mass lesion, hemorrhage, hydrocephalus or extra-axial collection. Vascular: There is atherosclerotic calcification of the major vessels at the base of the brain. Skull: Negative Sinuses/Orbits: Clear/normal Other: None IMPRESSION: No acute or traumatic finding.  Mild age related volume loss. Electronically Signed   By: Nelson Chimes M.D.   On: 01/04/2021 14:44   DG Chest Port 1 View  Result Date: 01/13/2021 CLINICAL DATA:  Sepsis EXAM: PORTABLE CHEST 1 VIEW COMPARISON:  03/09/2020 FINDINGS: Single frontal view of the chest demonstrates a stable cardiac silhouette. Stable hiatal hernia and prominent epicardial fat. No acute airspace disease, effusion, or pneumothorax. No acute bony abnormalities. IMPRESSION: 1. Stable chest, no acute intrathoracic process. Electronically Signed   By: Randa Ngo M.D.   On: 01/13/2021 16:00   DG ABD ACUTE 2+V W 1V CHEST  Result Date: 01/19/2021 CLINICAL DATA:  Abdominal pain EXAM: DG ABDOMEN ACUTE WITH 1 VIEW CHEST COMPARISON:  CT abdomen/pelvis dated 01/05/2021  FINDINGS: Large hernia at the right lung base. Bilateral lower lobe atelectasis. Lungs are otherwise clear.  No pleural effusion or pneumothorax. The heart is normal in size. Nonobstructive bowel gas pattern. No evidence of free air under the diaphragm on the upright view. Lumbar spine fixation hardware with prior vertebral augmentation. IMPRESSION: Negative abdominal radiographs.  No acute cardiopulmonary disease. Electronically Signed   By: Julian Hy M.D.   On: 01/19/2021 01:32   [place] LT UPPER EXTREM LTD SOFT TISSUE NON VASCULAR  Result Date: 01/18/2021 CLINICAL DATA:  Left arm swelling EXAM: ULTRASOUND LEFT UPPER EXTREMITY LIMITED TECHNIQUE: Ultrasound examination of the upper extremity soft tissues was performed in the area of clinical concern. COMPARISON:  None. FINDINGS: Dedicated grayscale and color Doppler sonography was performed within the left antecubital fossa in the area of focal swelling. No abnormal subcutaneous soft tissue mass, fluid collection, or calcification is seen within this region. IMPRESSION: No sonographic correlate for the patient's reported focal swelling Electronically Signed   By: Fidela Salisbury M.D.   On: 01/18/2021 19:39   CT Maxillofacial Wo Contrast  Result Date: 01/04/2021 CLINICAL DATA:  [place] 2 days ago with facial injury and bruising. EXAM: CT MAXILLOFACIAL WITHOUT CONTRAST TECHNIQUE: Multidetector  CT imaging of the maxillofacial structures was performed. Multiplanar CT image reconstructions were also generated. COMPARISON:  None. FINDINGS: Osseous: No facial fracture or focal bone lesion. Orbits: No evidence of orbital injury. Sinuses: Sinuses are clear. No traumatic fluid or inflammatory changes. Soft tissues: No soft tissue finding of note by CT. Limited intracranial: Normal IMPRESSION: Negative CT scan of the face. No evidence of fracture or orbital injury. Electronically Signed   By: Nelson Chimes M.D.   On: 01/04/2021 14:43   US Abdomen Limited RUQ  (LIVER/GB)  Result Date: 01/13/2021 CLINICAL DATA:  Right upper quadrant abdominal pain. EXAM: ULTRASOUND ABDOMEN LIMITED RIGHT UPPER QUADRANT COMPARISON:  Abdominal ultrasound and CT 08/31/2019 FINDINGS: Gallbladder: Physiologically distended. Layering intraluminal sludge. Gallstones on prior ultrasound are not well seen on the current exam. No gallbladder wall thickening. No sonographic Murphy sign noted by sonographer. Common bile duct: Diameter: 6 mm. Liver: Heterogeneous increased parenchymal echogenicity. The liver is difficult to penetrate. Allowing for this, no focal lesion. Technically limited assessment due to altered mental status and difficulty with breath hold. Portal vein is patent on color Doppler imaging with normal direction of blood flow towards the liver. Other: No right upper quadrant ascites. IMPRESSION: 1. Gallbladder sludge. Gallstones on prior ultrasound are not well seen on the current exam. No sonographic findings of acute cholecystitis. 2. No biliary dilatation. 3. Suggestion of hepatic steatosis. Electronically Signed   By: Keith Rake M.D.   On: 01/13/2021 18:10    Subjective: She is alert, diarrhea improved. Only had episode last night  Discharge Exam: Vitals:   01/20/21 0447 01/20/21 0751  BP: 134/77 (!) 143/107  Pulse: 72 71  Resp: 16 18  Temp: 98.1 F (36.7 C) 97.8 F (36.6 C)  SpO2: 96% 98%     General: Pt is alert, awake, not in acute distress Cardiovascular: RRR, S1/S2 +, no rubs, no gallops Respiratory: CTA bilaterally, no wheezing, no rhonchi Abdominal: Soft, NT, ND, bowel sounds + Extremities: no edema, no cyanosis    The results of significant diagnostics from this hospitalization (including imaging, microbiology, ancillary and laboratory) are listed below for reference.     Microbiology: Recent Results (from the past 240 hour(s))  Resp Panel by RT-PCR (Flu A&B, Covid) Nasopharyngeal Swab     Status: None   Collection Time: 01/13/21   2:18 PM   Specimen: Nasopharyngeal Swab; Nasopharyngeal(NP) swabs in vial transport medium  Result Value Ref Range Status   SARS Coronavirus 2 by RT PCR NEGATIVE NEGATIVE Final    Comment: (NOTE) SARS-CoV-2 target nucleic acids are NOT DETECTED.  The SARS-CoV-2 RNA is generally detectable in upper respiratory specimens during the acute phase of infection. The lowest concentration of SARS-CoV-2 viral copies this assay can detect is 138 copies/mL. A negative result does not preclude SARS-Cov-2 infection and should not be used as the sole basis for treatment or other patient management decisions. A negative result may occur with  improper specimen collection/handling, submission of specimen other than nasopharyngeal swab, presence of viral mutation(s) within the areas targeted by this assay, and inadequate number of viral copies(<138 copies/mL). A negative result must be combined with clinical observations, patient history, and epidemiological information. The expected result is Negative.  Fact Sheet for Patients:  EntrepreneurPulse.com.au  Fact Sheet for Healthcare Providers:  IncredibleEmployment.be  This test is no t yet approved or cleared by the [place] FDA and  has been authorized for detection and/or diagnosis of SARS-CoV-2 by FDA under an Emergency Use Authorization (EUA).  This EUA will remain  in effect (meaning this test can be used) for the duration of the COVID-19 declaration under Section 564(b)(1) of the Act, 21 U.S.C.section 360bbb-3(b)(1), unless the authorization is terminated  or revoked sooner.       Influenza A by PCR NEGATIVE NEGATIVE Final   Influenza B by PCR NEGATIVE NEGATIVE Final    Comment: (NOTE) The Xpert Xpress SARS-CoV-2/FLU/RSV plus assay is intended as an aid in the diagnosis of influenza from Nasopharyngeal swab specimens and should not be used as a sole basis for treatment. Nasal washings and aspirates  are unacceptable for Xpert Xpress SARS-CoV-2/FLU/RSV testing.  Fact Sheet for Patients: EntrepreneurPulse.com.au  Fact Sheet for Healthcare Providers: IncredibleEmployment.be  This test is not yet approved or cleared by the [place] FDA and has been authorized for detection and/or diagnosis of SARS-CoV-2 by FDA under an Emergency Use Authorization (EUA). This EUA will remain in effect (meaning this test can be used) for the duration of the COVID-19 declaration under Section 564(b)(1) of the Act, 21 U.S.C. section 360bbb-3(b)(1), unless the authorization is terminated or revoked.  Performed at [facility], [place] [address] [place], [place] [address]   Blood Culture (routine x 2)     Status: None   Collection Time: 01/13/21  2:50 PM   Specimen: BLOOD  Result Value Ref Range Status   Specimen Description BLOOD LEFT ANTECUBITAL  Final   Special Requests   Final    BOTTLES DRAWN AEROBIC AND ANAEROBIC Blood Culture adequate volume   Culture   Final    NO GROWTH 5 DAYS Performed at [facility], [place] [address] [place], [place] [address]    Report Status 01/18/2021 FINAL  Final  Urine Culture     Status: None   Collection Time: 01/13/21  5:29 PM   Specimen: In/Out Cath Urine  Result Value Ref Range Status   Specimen Description IN/OUT CATH URINE  Final   Special Requests NONE  Final   Culture   Final    NO GROWTH Performed at [facility], [place] [address] [place], [place] [address]    Report Status 01/14/2021 FINAL  Final  Blood Culture (routine x 2)     Status: None   Collection Time: 01/14/21  8:22 AM   Specimen: BLOOD  Result Value Ref Range Status   Specimen Description BLOOD LEFT ANTECUBITAL  Final   Special Requests   Final    BOTTLES DRAWN AEROBIC AND ANAEROBIC Blood Culture adequate volume   Culture   Final    NO GROWTH 5 DAYS Performed at [facility], [place] [address] [place], [place] [address]     Report Status 01/19/2021 FINAL  Final  Resp Panel by RT-PCR (Flu A&B, Covid) Nasopharyngeal Swab     Status: None   Collection Time: 01/18/21  6:33 AM   Specimen: Nasopharyngeal Swab; Nasopharyngeal(NP) swabs in vial transport medium  Result Value Ref Range Status   SARS Coronavirus 2 by RT PCR NEGATIVE NEGATIVE Final    Comment: (NOTE) SARS-CoV-2 target nucleic acids are NOT DETECTED.  The SARS-CoV-2 RNA is generally detectable in upper respiratory specimens during the acute phase of infection. The lowest concentration of SARS-CoV-2 viral copies this assay can detect is 138 copies/mL. A negative result does not preclude SARS-Cov-2 infection and should not be used as the sole basis for treatment or other patient management decisions. A negative result may occur with  improper specimen collection/handling, submission of  specimen other than nasopharyngeal swab, presence of viral mutation(s) within the areas targeted by this assay, and inadequate number of viral copies(<138 copies/mL). A negative result must be combined with clinical observations, patient history, and epidemiological information. The expected result is Negative.  Fact Sheet for Patients:  EntrepreneurPulse.com.au  Fact Sheet for Healthcare Providers:  IncredibleEmployment.be  This test is no t yet approved or cleared by the [place] FDA and  has been authorized for detection and/or diagnosis of SARS-CoV-2 by FDA under an Emergency Use Authorization (EUA). This EUA will remain  in effect (meaning this test can be used) for the duration of the COVID-19 declaration under Section 564(b)(1) of the Act, 21 U.S.C.section 360bbb-3(b)(1), unless the authorization is terminated  or revoked sooner.       Influenza A by PCR NEGATIVE NEGATIVE Final   Influenza B by PCR NEGATIVE NEGATIVE Final    Comment: (NOTE) The Xpert Xpress SARS-CoV-2/FLU/RSV plus assay is intended as an aid in  the diagnosis of influenza from Nasopharyngeal swab specimens and should not be used as a sole basis for treatment. Nasal washings and aspirates are unacceptable for Xpert Xpress SARS-CoV-2/FLU/RSV testing.  Fact Sheet for Patients: EntrepreneurPulse.com.au  Fact Sheet for Healthcare Providers: IncredibleEmployment.be  This test is not yet approved or cleared by the [place] FDA and has been authorized for detection and/or diagnosis of SARS-CoV-2 by FDA under an Emergency Use Authorization (EUA). This EUA will remain in effect (meaning this test can be used) for the duration of the COVID-19 declaration under Section 564(b)(1) of the Act, 21 U.S.C. section 360bbb-3(b)(1), unless the authorization is terminated or revoked.  Performed at [facility], [place] [address] [place], [place] [address]   Gastrointestinal Panel by PCR , Stool     Status: None   Collection Time: 01/18/21  1:09 PM   Specimen: Stool  Result Value Ref Range Status   Campylobacter species NOT DETECTED NOT DETECTED Final   Plesimonas shigelloides NOT DETECTED NOT DETECTED Final   Salmonella species NOT DETECTED NOT DETECTED Final   Yersinia enterocolitica NOT DETECTED NOT DETECTED Final   Vibrio species NOT DETECTED NOT DETECTED Final   Vibrio cholerae NOT DETECTED NOT DETECTED Final   Enteroaggregative E coli (EAEC) NOT DETECTED NOT DETECTED Final   Enteropathogenic E coli (EPEC) NOT DETECTED NOT DETECTED Final   Enterotoxigenic E coli (ETEC) NOT DETECTED NOT DETECTED Final   Shiga like toxin producing E coli (STEC) NOT DETECTED NOT DETECTED Final   Shigella/Enteroinvasive E coli (EIEC) NOT DETECTED NOT DETECTED Final   Cryptosporidium NOT DETECTED NOT DETECTED Final   Cyclospora cayetanensis NOT DETECTED NOT DETECTED Final   Entamoeba histolytica NOT DETECTED NOT DETECTED Final   Giardia lamblia NOT DETECTED NOT DETECTED Final   Adenovirus F40/41 NOT DETECTED NOT  DETECTED Final   Astrovirus NOT DETECTED NOT DETECTED Final   Norovirus GI/GII NOT DETECTED NOT DETECTED Final   Rotavirus A NOT DETECTED NOT DETECTED Final   Sapovirus (I, II, IV, and V) NOT DETECTED NOT DETECTED Final    Comment: Performed at [facility], [place] [place], [place] [address]  C Difficile Quick Screen w PCR reflex     Status: None   Collection Time: 01/18/21  1:09 PM   Specimen: Stool  Result Value Ref Range Status   C Diff antigen NEGATIVE NEGATIVE Final   C Diff toxin NEGATIVE NEGATIVE Final   C Diff interpretation No C. difficile detected.  Final  Comment: Performed at [facility], [place] [address], [place], [place] [address]     Labs: BNP (last 3 results) No results for input(s): BNP in the last 8760 hours. Basic Metabolic Panel: Recent Labs  Lab 01/14/21 0722 01/14/21 1013 01/16/21 0525 01/17/21 0501 01/17/21 2353 01/18/21 1220 01/18/21 1313 01/19/21 0037 01/20/21 0645 01/20/21 1235  NA  --    < > 146* 140 135 136  --  134* 136 134*  K  --    < > 3.2* 3.0* 2.9* 3.5  --  3.2* 2.9* 3.5  CL  --    < > 116* 109 102 103  --  105 104 102  CO2  --    < > 24 23 22 24   --  24 28 26   GLUCOSE  --    < > 100* 76 84 72  --  78 83 146*  BUN  --    < > 46* 21 14 13   --  14 10 10   CREATININE  --    < > 1.34* 1.06* 0.90 0.88  --  0.88 0.81 0.81  CALCIUM  --    < > 9.1 8.8* 8.9 8.7*  --  8.2* 8.5* 8.5*  MG 2.4   < > 1.8 1.3*  --   --  1.9 1.5* 1.8  --   PHOS 6.2*  --   --   --   --   --   --   --   --   --    < > = values in this interval not displayed.   Liver Function Tests: Recent Labs  Lab 01/15/21 0831 01/16/21 0525 01/17/21 0501 01/17/21 2353  AST 59* 35 30 33  ALT 162* 117* 82* 76*  ALKPHOS 69 77 76 82  BILITOT 1.1 0.5 0.6 0.8  PROT 4.7* 5.0* 4.8* 5.3*  ALBUMIN 2.4* 2.3* 2.3* 2.6*   No results for input(s): LIPASE, AMYLASE in the last 168 hours. No results for input(s): AMMONIA in the last 168 hours. CBC: Recent Labs   Lab 01/15/21 0806 01/16/21 0525 01/17/21 0501 01/17/21 2353 01/19/21 0037  WBC 11.1* 10.6* 12.1* 14.2* 12.6*  NEUTROABS  --   --   --  11.5*  --   HGB 11.6* 12.3 12.4 14.2 12.2  HCT 36.0 38.6 38.4 42.6 36.2  MCV 96.0 97.0 96.0 94.0 91.9  PLT 207 223 234 253 269   Cardiac Enzymes: Recent Labs  Lab 01/15/21 0806  CKTOTAL 100   BNP: Invalid input(s): POCBNP CBG: Recent Labs  Lab 01/16/21 1136 01/16/21 2135 01/17/21 0007 01/17/21 0558 01/17/21 1134  GLUCAP 101* 108* 88 74 81   D-Dimer No results for input(s): DDIMER in the last 72 hours. Hgb A1c No results for input(s): HGBA1C in the last 72 hours. Lipid Profile No results for input(s): CHOL, HDL, LDLCALC, TRIG, CHOLHDL, LDLDIRECT in the last 72 hours. Thyroid function studies Recent Labs    01/18/21 1313  TSH 12.822*   Anemia work up No results for input(s): VITAMINB12, FOLATE, FERRITIN, TIBC, IRON, RETICCTPCT in the last 72 hours. Urinalysis    Component Value Date/Time   COLORURINE AMBER (A) 01/13/2021 1729   APPEARANCEUR HAZY (A) 01/13/2021 1729   LABSPEC 1.015 01/13/2021 1729   PHURINE 5.0 01/13/2021 1729   GLUCOSEU NEGATIVE 01/13/2021 1729   HGBUR NEGATIVE 01/13/2021 1729   BILIRUBINUR NEGATIVE 01/13/2021 1729   KETONESUR NEGATIVE 01/13/2021 1729   PROTEINUR NEGATIVE 01/13/2021 1729   UROBILINOGEN 0.2 11/12/2013  [place] 01/13/2021 [place] 01/13/2021 1729   Sepsis Labs Invalid input(s): PROCALCITONIN,  WBC,  LACTICIDVEN Microbiology Recent Results (from the past 240 hour(s))  Resp Panel by RT-PCR (Flu A&B, Covid) Nasopharyngeal Swab     Status: None   Collection Time: 01/13/21  2:18 PM   Specimen: Nasopharyngeal Swab; Nasopharyngeal(NP) swabs in vial transport medium  Result Value Ref Range Status   SARS Coronavirus 2 by RT PCR NEGATIVE NEGATIVE Final    Comment: (NOTE) SARS-CoV-2 target nucleic acids are NOT DETECTED.  The SARS-CoV-2 RNA is generally  detectable in upper respiratory specimens during the acute phase of infection. The lowest concentration of SARS-CoV-2 viral copies this assay can detect is 138 copies/mL. A negative result does not preclude SARS-Cov-2 infection and should not be used as the sole basis for treatment or other patient management decisions. A negative result may occur with  improper specimen collection/handling, submission of specimen other than nasopharyngeal swab, presence of viral mutation(s) within the areas targeted by this assay, and inadequate number of viral copies(<138 copies/mL). A negative result must be combined with clinical observations, patient history, and epidemiological information. The expected result is Negative.  Fact Sheet for Patients:  EntrepreneurPulse.com.au  Fact Sheet for Healthcare Providers:  IncredibleEmployment.be  This test is no t yet approved or cleared by the [place] FDA and  has been authorized for detection and/or diagnosis of SARS-CoV-2 by FDA under an Emergency Use Authorization (EUA). This EUA will remain  in effect (meaning this test can be used) for the duration of the COVID-19 declaration under Section 564(b)(1) of the Act, 21 U.S.C.section 360bbb-3(b)(1), unless the authorization is terminated  or revoked sooner.       Influenza A by PCR NEGATIVE NEGATIVE Final   Influenza B by PCR NEGATIVE NEGATIVE Final    Comment: (NOTE) The Xpert Xpress SARS-CoV-2/FLU/RSV plus assay is intended as an aid in the diagnosis of influenza from Nasopharyngeal swab specimens and should not be used as a sole basis for treatment. Nasal washings and aspirates are unacceptable for Xpert Xpress SARS-CoV-2/FLU/RSV testing.  Fact Sheet for Patients: EntrepreneurPulse.com.au  Fact Sheet for Healthcare Providers: IncredibleEmployment.be  This test is not yet approved or cleared by the [place] FDA  and has been authorized for detection and/or diagnosis of SARS-CoV-2 by FDA under an Emergency Use Authorization (EUA). This EUA will remain in effect (meaning this test can be used) for the duration of the COVID-19 declaration under Section 564(b)(1) of the Act, 21 U.S.C. section 360bbb-3(b)(1), unless the authorization is terminated or revoked.  Performed at [facility], [place] [address] [place], [place] [address]   Blood Culture (routine x 2)     Status: None   Collection Time: 01/13/21  2:50 PM   Specimen: BLOOD  Result Value Ref Range Status   Specimen Description BLOOD LEFT ANTECUBITAL  Final   Special Requests   Final    BOTTLES DRAWN AEROBIC AND ANAEROBIC Blood Culture adequate volume   Culture   Final    NO GROWTH 5 DAYS Performed at [facility], [place] [address] [place],  [address]    Report Status 01/18/2021 FINAL  Final  Urine Culture     Status: None   Collection Time: 01/13/21  5:29 PM   Specimen: In/Out Cath Urine  Result Value Ref Range Status   Specimen Description IN/OUT CATH URINE  Final   Special Requests NONE  Final   Culture  Final    NO GROWTH Performed at [facility], [place] [address], [place], [place] [address]    Report Status 01/14/2021 FINAL  Final  Blood Culture (routine x 2)     Status: None   Collection Time: 01/14/21  8:22 AM   Specimen: BLOOD  Result Value Ref Range Status   Specimen Description BLOOD LEFT ANTECUBITAL  Final   Special Requests   Final    BOTTLES DRAWN AEROBIC AND ANAEROBIC Blood Culture adequate volume   Culture   Final    NO GROWTH 5 DAYS Performed at [facility], [place] [address], [place], [place] [address]    Report Status 01/19/2021 FINAL  Final  Resp Panel by RT-PCR (Flu A&B, Covid) Nasopharyngeal Swab     Status: None   Collection Time: 01/18/21  6:33 AM   Specimen: Nasopharyngeal Swab; Nasopharyngeal(NP) swabs in vial transport medium  Result Value Ref Range Status   SARS  Coronavirus 2 by RT PCR NEGATIVE NEGATIVE Final    Comment: (NOTE) SARS-CoV-2 target nucleic acids are NOT DETECTED.  The SARS-CoV-2 RNA is generally detectable in upper respiratory specimens during the acute phase of infection. The lowest concentration of SARS-CoV-2 viral copies this assay can detect is 138 copies/mL. A negative result does not preclude SARS-Cov-2 infection and should not be used as the sole basis for treatment or other patient management decisions. A negative result may occur with  improper specimen collection/handling, submission of specimen other than nasopharyngeal swab, presence of viral mutation(s) within the areas targeted by this assay, and inadequate number of viral copies(<138 copies/mL). A negative result must be combined with clinical observations, patient history, and epidemiological information. The expected result is Negative.  Fact Sheet for Patients:  EntrepreneurPulse.com.au  Fact Sheet for Healthcare Providers:  IncredibleEmployment.be  This test is no t yet approved or cleared by the [place] FDA and  has been authorized for detection and/or diagnosis of SARS-CoV-2 by FDA under an Emergency Use Authorization (EUA). This EUA will remain  in effect (meaning this test can be used) for the duration of the COVID-19 declaration under Section 564(b)(1) of the Act, 21 U.S.C.section 360bbb-3(b)(1), unless the authorization is terminated  or revoked sooner.       Influenza A by PCR NEGATIVE NEGATIVE Final   Influenza B by PCR NEGATIVE NEGATIVE Final    Comment: (NOTE) The Xpert Xpress SARS-CoV-2/FLU/RSV plus assay is intended as an aid in the diagnosis of influenza from Nasopharyngeal swab specimens and should not be used as a sole basis for treatment. Nasal washings and aspirates are unacceptable for Xpert Xpress SARS-CoV-2/FLU/RSV testing.  Fact Sheet for  Patients: EntrepreneurPulse.com.au  Fact Sheet for Healthcare Providers: IncredibleEmployment.be  This test is not yet approved or cleared by the [place] FDA and has been authorized for detection and/or diagnosis of SARS-CoV-2 by FDA under an Emergency Use Authorization (EUA). This EUA will remain in effect (meaning this test can be used) for the duration of the COVID-19 declaration under Section 564(b)(1) of the Act, 21 U.S.C. section 360bbb-3(b)(1), unless the authorization is terminated or revoked.  Performed at [facility], [place] [address] [place], [place] [address]   Gastrointestinal Panel by PCR , Stool     Status: None   Collection Time: 01/18/21  1:09 PM   Specimen: Stool  Result Value Ref Range Status   Campylobacter species NOT DETECTED NOT DETECTED Final   Plesimonas shigelloides NOT DETECTED NOT DETECTED Final   Salmonella  species NOT DETECTED NOT DETECTED Final   Yersinia enterocolitica NOT DETECTED NOT DETECTED Final   Vibrio species NOT DETECTED NOT DETECTED Final   Vibrio cholerae NOT DETECTED NOT DETECTED Final   Enteroaggregative E coli (EAEC) NOT DETECTED NOT DETECTED Final   Enteropathogenic E coli (EPEC) NOT DETECTED NOT DETECTED Final   Enterotoxigenic E coli (ETEC) NOT DETECTED NOT DETECTED Final   Shiga like toxin producing E coli (STEC) NOT DETECTED NOT DETECTED Final   Shigella/Enteroinvasive E coli (EIEC) NOT DETECTED NOT DETECTED Final   Cryptosporidium NOT DETECTED NOT DETECTED Final   Cyclospora cayetanensis NOT DETECTED NOT DETECTED Final   Entamoeba histolytica NOT DETECTED NOT DETECTED Final   Giardia lamblia NOT DETECTED NOT DETECTED Final   Adenovirus F40/41 NOT DETECTED NOT DETECTED Final   Astrovirus NOT DETECTED NOT DETECTED Final   Norovirus GI/GII NOT DETECTED NOT DETECTED Final   Rotavirus A NOT DETECTED NOT DETECTED Final   Sapovirus (I, II, IV, and V) NOT DETECTED NOT DETECTED Final     Comment: Performed at [facility], [place]., [place], [place] [address]  C Difficile Quick Screen w PCR reflex     Status: None   Collection Time: 01/18/21  1:09 PM   Specimen: Stool  Result Value Ref Range Status   C Diff antigen NEGATIVE NEGATIVE Final   C Diff toxin NEGATIVE NEGATIVE Final   C Diff interpretation No C. difficile detected.  Final    Comment: Performed at [facility], [place] [address], [place], [place] [address]     Time coordinating discharge: 40 minutes  SIGNED:   Elmarie Shiley, MD  [facility]

## 2021-01-20 NOTE — TOC Progression Note (Addendum)
Transition of Care [facility]) - Progression Note    Patient Details  Name: Victoria Levine MRN: [id number] Date of Birth: 1951/01/21  Transition of Care [facility]) CM/SW Contact  Emeterio Reeve, [place] Phone Number: 01/20/2021, 11:27 AM  Clinical Narrative:     Pt accepted bed at [facility]. [place] starting insurance auth. CSW requested covid test.        Expected Discharge Plan and Services                                                 Social Determinants of Health (SDOH) Interventions    Readmission Risk Interventions Readmission Risk Prevention Plan 02/25/2020  Transportation Screening Complete  Medication Review ([place]) Complete  PCP or Specialist appointment within 3-5 days of discharge Complete  SW Recovery Care/Counseling Consult Complete  Skilled [place] Complete  Some recent data might be hidden   Emeterio Reeve, LCSW Clinical Social Worker

## 2021-01-20 NOTE — Progress Notes (Signed)
Spoke with Victoria Levine. Pt. Is not currently receiving any IV meds or fluids and therefore does not need a new IV. Advised that they can call us any time IV is medically needed.

## 2021-01-21 DIAGNOSIS — R197 Diarrhea, unspecified: Secondary | ICD-10-CM | POA: Diagnosis not present

## 2021-01-21 LAB — BASIC METABOLIC PANEL
Anion gap: 9 (ref 5–15)
BUN: 11 mg/dL (ref 8–23)
CO2: 25 mmol/L (ref 22–32)
Calcium: 8.7 mg/dL — ABNORMAL LOW (ref 8.9–10.3)
Chloride: 105 mmol/L (ref 98–111)
Creatinine, Ser: 0.83 mg/dL (ref 0.44–1.00)
GFR, Estimated: >60 mL/min (ref >60)
Glucose, Bld: 94 mg/dL (ref 70–99)
Potassium: 3.6 mmol/L (ref 3.5–5.1)
Sodium: 139 mmol/L (ref 135–145)

## 2021-01-21 MED ORDER — POTASSIUM CHLORIDE CRYS ER 20 MEQ PO TBCR
40.0000 meq | EXTENDED_RELEASE_TABLET | Freq: Once | ORAL | Status: AC
  Administered 2021-01-21: 40 meq via ORAL
  Filled 2021-01-21: qty 2

## 2021-01-21 MED ORDER — POTASSIUM CHLORIDE 20 MEQ PO PACK: 40.0000 meq | PACK | Freq: Every day | ORAL | 0 refills | Status: DC

## 2021-01-21 MED ORDER — LEVOTHYROXINE SODIUM 100 MCG PO TABS
125.0000 ug | ORAL_TABLET | Freq: Every day | ORAL | Status: DC
  Administered 2021-01-22 – 2021-01-27 (×6): 125 ug via ORAL
  Filled 2021-01-21 (×6): qty 1

## 2021-01-21 NOTE — TOC Progression Note (Addendum)
Transition of Care [facility]) - Progression Note    Patient Details  Name: Victoria Levine MRN: [id number] Date of Birth: [birth year]  Transition [facility] Care [facility]) CM/SW Contact  [address], [place], [place] Phone Number: 01/21/2021, 1:09 PM  Clinical Narrative:     Phone call from [facility]. Insurance authorization approved, however they are unable to take patient until her [name] is updated. Status of PASRR still shows pending in [place] MUST system.  Transition of Care to continue to follow  [facility], LCSW Transition of Care [phone]         Expected Discharge Plan and Services           Expected Discharge Date: 01/20/21                                     Social Determinants of Health (SDOH) Interventions    Readmission Risk Interventions Readmission Risk Prevention Plan 02/25/2020  Transportation Screening Complete  Medication Review (RN Care Manager) Complete  PCP or Specialist appointment within 3-5 days of discharge Complete  SW Recovery Care/Counseling Consult Complete  Skilled Nursing Facility Complete  Some recent data might be hidden

## 2021-01-21 NOTE — Discharge Summary (Signed)
Physician Discharge Summary  Victoria Levine XKG:[id number] DOB: May 17, 1950 DOA: 01/17/2021  PCP: Victoria Jordan, MD  Admit date: 01/17/2021 Discharge date: 01/21/2021  Admitted From:  Home  Disposition:  SNF  Recommendations for Outpatient Follow-up:  Follow up with PCP in 1-2 weeks Please obtain BMP/CBC in one week Needs follow up with Endocrinologist in 2 -4 weeks.  Monitor K level. Replete as needed.    Discharge Condition: Stable.  CODE STATUS: DNR Diet recommendation: Heart Healthy   Brief/Interim Summary:   1-Nausea vomiting and diarrhea acute: C. Diff  rule out Likely related to gastroenteritis. Symptoms  have improved. Continue with supportive care.   2-Hypokalemia/ hypomagnesemia; replete orally. Discharge on supplement.      Leukocytosis: trending down.    Generalized weakness: She will benefit from rehab.  She agreed to go to rehab.   Hypothyroidism: Advised patient to stop taking biotin supplements. Repeated TSH: down to 12. Free t4 0.81 She has been compliant with her medication. Plan to increase synthroid to 125 mcg.    Chronic pain, chronic opioid use: UDS last hospitalization was negative.   Depression: Continue with Prozac Wellbutrin and Seroquel   Transaminases: Trending down Edema; no focal edema on [place].    Stable to be transfer to Rehab.   Discharge Diagnoses:  Principal Problem:   Diarrhea Active Problems:   Hypothyroidism   Bipolar 1 disorder, depressed (HCC)   Chronic pain   Hypokalemia   Leukocytosis   Transaminitis   Generalized weakness    Discharge Instructions  Discharge Instructions     Diet - low sodium heart healthy   Complete by: As directed    Increase activity slowly   Complete by: As directed       Allergies as of 01/21/2021       Reactions   Prednisone Other (See Comments)   Cannot tolerate ORALLY, but can tolerate by shot (interacts with anxiety & bipolarity)   Lithium Carbonate Other (See Comments)    Patient was taken off of this because of a weight gain of 40 pounds   Other Other (See Comments)   Serious reaction to 1st COVID vaccination - unknown type   Sulfa Antibiotics Other (See Comments)   From childhood; reaction not recalled        Medication List     TAKE these medications    albuterol 108 (90 Base) MCG/ACT inhaler Commonly known as: VENTOLIN HFA Inhale 2 puffs into the lungs every 6 (six) hours as needed for wheezing or shortness of breath.   albuterol (2.5 MG/3ML) 0.083% nebulizer solution Commonly known as: PROVENTIL Take 2.5 mg by nebulization every 6 (six) hours as needed for wheezing or shortness of breath.   buPROPion 150 MG 24 hr tablet Commonly known as: WELLBUTRIN XL Take 150 mg by mouth 2 (two) times daily.   EPINEPHrine 0.3 mg/0.3 mL Soaj injection Commonly known as: EPI-PEN Inject 0.3 mg into the skin daily as needed for anaphylaxis.   famotidine 20 MG tablet Commonly known as: PEPCID Take 1 tablet (20 mg total) by mouth at bedtime.   FLUoxetine 20 MG capsule Commonly known as: PROZAC Take 20 mg by mouth 2 (two) times daily.   gabapentin 600 MG tablet Commonly known as: NEURONTIN Take 600 mg by mouth 3 (three) times daily as needed (pain).   levothyroxine 125 MCG tablet Commonly known as: SYNTHROID Take 1 tablet (125 mcg total) by mouth daily before breakfast. What changed:  medication strength how much to take  loperamide 2 MG capsule Commonly known as: IMODIUM Take 2 capsules (4 mg total) by mouth as needed for diarrhea or loose stools.   multivitamin with minerals Tabs tablet Take 1 tablet by mouth daily.   naproxen sodium 220 MG tablet Commonly known as: ALEVE Take 440 mg by mouth every 12 (twelve) hours as needed (pain).   oxcarbazepine 600 MG tablet Commonly known as: TRILEPTAL Take 600 mg by mouth 2 (two) times daily.   Oxycodone HCl 10 MG Tabs Take 1 tablet (10 mg total) by mouth every 4 (four) hours as needed (for  breakthrough pain). What changed:  when to take this reasons to take this   potassium chloride 20 MEQ packet Commonly known as: KLOR-CON Take 40 mEq by mouth daily for 5 days.   QUEtiapine 400 MG tablet Commonly known as: SEROQUEL Take 400 mg by mouth every evening.   rizatriptan 10 MG disintegrating tablet Commonly known as: MAXALT-MLT Take 10 mg by mouth daily as needed for migraine.   Xtampza ER 9 MG C12a Generic drug: oxyCODONE ER Take 9 mg by mouth 2 (two) times daily.        Allergies  Allergen Reactions   Prednisone Other (See Comments)    Cannot tolerate ORALLY, but can tolerate by shot (interacts with anxiety & bipolarity)   Lithium Carbonate Other (See Comments)    Patient was taken off of this because of a weight gain of 40 pounds   Other Other (See Comments)    Serious reaction to 1st COVID vaccination - unknown type   Sulfa Antibiotics Other (See Comments)    From childhood; reaction not recalled    Consultations: None   Procedures/Studies: CT ABDOMEN PELVIS WO CONTRAST  Result Date: 01/13/2021 CLINICAL DATA:  Sepsis EXAM: CT ABDOMEN AND PELVIS WITHOUT CONTRAST TECHNIQUE: Multidetector CT imaging of the abdomen and pelvis was performed following the standard protocol without IV contrast. COMPARISON:  Virtual colonoscopy dated 11/17/2019 FINDINGS: Lower chest: Although not ordered, almost the entire chest is within the field of view. No evidence of pneumonia. Mild compressive atelectasis in the medial right middle lobe. No pleural effusion or pneumothorax. Large fat containing anterior hernia along the right anterior aspect of the heart. The heart is normal in size. No pericardial effusion. Mild atherosclerotic calcifications of the arch. Hepatobiliary: Unenhanced liver is unremarkable. Gallbladder is unremarkable. No intrahepatic or extrahepatic ductal dilatation. Pancreas: Within normal limits. Spleen: Within normal limits. Adrenals/Urinary Tract: Adrenal  glands are within normal limits. Kidneys are within normal limits. No renal calculi or hydronephrosis. Bladder decompressed by an indwelling Foley catheter with trace gas. Stomach/Bowel: Stomach is notable for a moderate hiatal hernia/intrathoracic stomach. No evidence of bowel obstruction. Normal appendix (series 8/image 4). Mild sigmoid diverticulosis, without evidence of diverticulitis. Vascular/Lymphatic: No evidence of abdominal aortic aneurysm. Atherosclerotic calcifications of the abdominal aorta and branch vessels. No suspicious abdominopelvic lymphadenopathy. Reproductive: Uterus is within normal limits. Bilateral ovaries are unremarkable. Other: No abdominopelvic ascites. Musculoskeletal: Severe compression fracture deformity at T3 with mild retropulsion, age indeterminate. Mild superior endplate compression fracture deformity at T8, likely chronic. Prior vertebral augmentation at T12 and L2. Status post PLIF at L3-5. Degenerative changes of the lumbar spine. Left hip arthroplasty. IMPRESSION: No evidence of bowel obstruction. Normal appendix. Mild sigmoid diverticulosis, without evidence of diverticulitis. Bladder decompressed by an indwelling Foley catheter. No evidence of pneumonia. Moderate hiatal hernia.  Large anterior hernia with fat. Severe compression fracture deformity at T3 with mild retropulsion, age indeterminate. Electronically Signed  By: Julian Hy M.D.   On: 01/13/2021 20:07   CT Head Wo Contrast  Result Date: 01/13/2021 CLINICAL DATA:  Head trauma EXAM: CT HEAD WITHOUT CONTRAST TECHNIQUE: Contiguous axial images were obtained from the base of the skull through the vertex without intravenous contrast. COMPARISON:  CT brain 01/04/2021, 03/02/2020, 02/23/2020 FINDINGS: Brain: No acute territorial infarction, hemorrhage or intracranial mass is visualized. Atrophy and mild chronic small vessel ischemic changes of the white matter. Small chronic infarcts in the cerebellum. Stable  left posterior parafalcine round calcification likely calcified meningioma. Stable ventricle size. Vascular: No hyperdense vessels.  Carotid vascular calcification Skull: No fracture. Stable 11 mm intra osseous ground-glass lesion within the left frontal bone, felt benign. Sinuses/Orbits: No acute finding. Other: None IMPRESSION: 1. No CT evidence for acute intracranial abnormality. 2. Atrophy and mild chronic small vessel ischemic changes of the white matter Electronically Signed   By: Donavan Foil M.D.   On: 01/13/2021 16:12   CT HEAD WO CONTRAST (5MM)  Result Date: 01/04/2021 CLINICAL DATA:  [place] 2 days ago with trauma to the head and face. EXAM: CT HEAD WITHOUT CONTRAST TECHNIQUE: Contiguous axial images were obtained from the base of the skull through the vertex without intravenous contrast. COMPARISON:  03/09/2020 FINDINGS: Brain: Mild age related volume loss. No evidence of old or acute focal infarction, mass lesion, hemorrhage, hydrocephalus or extra-axial collection. Vascular: There is atherosclerotic calcification of the major vessels at the base of the brain. Skull: Negative Sinuses/Orbits: Clear/normal Other: None IMPRESSION: No acute or traumatic finding.  Mild age related volume loss. Electronically Signed   By: Nelson Chimes M.D.   On: 01/04/2021 14:44   DG Chest Port 1 View  Result Date: 01/13/2021 CLINICAL DATA:  Sepsis EXAM: PORTABLE CHEST 1 VIEW COMPARISON:  03/09/2020 FINDINGS: Single frontal view of the chest demonstrates a stable cardiac silhouette. Stable hiatal hernia and prominent epicardial fat. No acute airspace disease, effusion, or pneumothorax. No acute bony abnormalities. IMPRESSION: 1. Stable chest, no acute intrathoracic process. Electronically Signed   By: Randa Ngo M.D.   On: 01/13/2021 16:00   DG ABD ACUTE 2+V W 1V CHEST  Result Date: 01/19/2021 CLINICAL DATA:  Abdominal pain EXAM: DG ABDOMEN ACUTE WITH 1 VIEW CHEST COMPARISON:  CT abdomen/pelvis dated  01/05/2021 FINDINGS: Large hernia at the right lung base. Bilateral lower lobe atelectasis. Lungs are otherwise clear.  No pleural effusion or pneumothorax. The heart is normal in size. Nonobstructive bowel gas pattern. No evidence of free air under the diaphragm on the upright view. Lumbar spine fixation hardware with prior vertebral augmentation. IMPRESSION: Negative abdominal radiographs.  No acute cardiopulmonary disease. Electronically Signed   By: Julian Hy M.D.   On: 01/19/2021 01:32   [place] LT UPPER EXTREM LTD SOFT TISSUE NON VASCULAR  Result Date: 01/18/2021 CLINICAL DATA:  Left arm swelling EXAM: ULTRASOUND LEFT UPPER EXTREMITY LIMITED TECHNIQUE: Ultrasound examination of the upper extremity soft tissues was performed in the area of clinical concern. COMPARISON:  None. FINDINGS: Dedicated grayscale and color Doppler sonography was performed within the left antecubital fossa in the area of focal swelling. No abnormal subcutaneous soft tissue mass, fluid collection, or calcification is seen within this region. IMPRESSION: No sonographic correlate for the patient's reported focal swelling Electronically Signed   By: Fidela Salisbury M.D.   On: 01/18/2021 19:39   CT Maxillofacial Wo Contrast  Result Date: 01/04/2021 CLINICAL DATA:  [place] 2 days ago with facial injury and bruising. EXAM: CT MAXILLOFACIAL WITHOUT  CONTRAST TECHNIQUE: Multidetector CT imaging of the maxillofacial structures was performed. Multiplanar CT image reconstructions were also generated. COMPARISON:  None. FINDINGS: Osseous: No facial fracture or focal bone lesion. Orbits: No evidence of orbital injury. Sinuses: Sinuses are clear. No traumatic fluid or inflammatory changes. Soft tissues: No soft tissue finding of note by CT. Limited intracranial: Normal IMPRESSION: Negative CT scan of the face. No evidence of fracture or orbital injury. Electronically Signed   By: Nelson Chimes M.D.   On: 01/04/2021 14:43   US Abdomen Limited  RUQ (LIVER/GB)  Result Date: 01/13/2021 CLINICAL DATA:  Right upper quadrant abdominal pain. EXAM: ULTRASOUND ABDOMEN LIMITED RIGHT UPPER QUADRANT COMPARISON:  Abdominal ultrasound and CT 08/31/2019 FINDINGS: Gallbladder: Physiologically distended. Layering intraluminal sludge. Gallstones on prior ultrasound are not well seen on the current exam. No gallbladder wall thickening. No sonographic Murphy sign noted by sonographer. Common bile duct: Diameter: 6 mm. Liver: Heterogeneous increased parenchymal echogenicity. The liver is difficult to penetrate. Allowing for this, no focal lesion. Technically limited assessment due to altered mental status and difficulty with breath hold. Portal vein is patent on color Doppler imaging with normal direction of blood flow towards the liver. Other: No right upper quadrant ascites. IMPRESSION: 1. Gallbladder sludge. Gallstones on prior ultrasound are not well seen on the current exam. No sonographic findings of acute cholecystitis. 2. No biliary dilatation. 3. Suggestion of hepatic steatosis. Electronically Signed   By: Keith Rake M.D.   On: 01/13/2021 18:10    Subjective: She is alert, diarrhea improved. Only had episode last night  Discharge Exam: Vitals:   01/21/21 0434 01/21/21 0726  BP: (!) 147/77 133/72  Pulse: 77 76  Resp: 19 16  Temp: 98.9 F (37.2 C) 98.3 F (36.8 C)  SpO2: 96% 96%     General: Pt is alert, awake, not in acute distress Cardiovascular: RRR, S1/S2 +, no rubs, no gallops Respiratory: CTA bilaterally, no wheezing, no rhonchi Abdominal: Soft, NT, ND, bowel sounds + Extremities: no edema, no cyanosis    The results of significant diagnostics from this hospitalization (including imaging, microbiology, ancillary and laboratory) are listed below for reference.     Microbiology: Recent Results (from the past 240 hour(s))  Resp Panel by RT-PCR (Flu A&B, Covid) Nasopharyngeal Swab     Status: None   Collection Time: 01/13/21   2:18 PM   Specimen: Nasopharyngeal Swab; Nasopharyngeal(NP) swabs in vial transport medium  Result Value Ref Range Status   SARS Coronavirus 2 by RT PCR NEGATIVE NEGATIVE Final    Comment: (NOTE) SARS-CoV-2 target nucleic acids are NOT DETECTED.  The SARS-CoV-2 RNA is generally detectable in upper respiratory specimens during the acute phase of infection. The lowest concentration of SARS-CoV-2 viral copies this assay can detect is 138 copies/mL. A negative result does not preclude SARS-Cov-2 infection and should not be used as the sole basis for treatment or other patient management decisions. A negative result may occur with  improper specimen collection/handling, submission of specimen other than nasopharyngeal swab, presence of viral mutation(s) within the areas targeted by this assay, and inadequate number of viral copies(<138 copies/mL). A negative result must be combined with clinical observations, patient history, and epidemiological information. The expected result is Negative.  Fact Sheet for Patients:  EntrepreneurPulse.com.au  Fact Sheet for Healthcare Providers:  IncredibleEmployment.be  This test is no t yet approved or cleared by the [place] FDA and  has been authorized for detection and/or diagnosis of SARS-CoV-2 by FDA under an Emergency  Use Authorization (EUA). This EUA will remain  in effect (meaning this test can be used) for the duration of the COVID-19 declaration under Section 564(b)(1) of the Act, 21 U.S.C.section 360bbb-3(b)(1), unless the authorization is terminated  or revoked sooner.       Influenza A by PCR NEGATIVE NEGATIVE Final   Influenza B by PCR NEGATIVE NEGATIVE Final    Comment: (NOTE) The Xpert Xpress SARS-CoV-2/FLU/RSV plus assay is intended as an aid in the diagnosis of influenza from Nasopharyngeal swab specimens and should not be used as a sole basis for treatment. Nasal washings and aspirates  are unacceptable for Xpert Xpress SARS-CoV-2/FLU/RSV testing.  Fact Sheet for Patients: EntrepreneurPulse.com.au  Fact Sheet for Healthcare Providers: IncredibleEmployment.be  This test is not yet approved or cleared by the [place] FDA and has been authorized for detection and/or diagnosis of SARS-CoV-2 by FDA under an Emergency Use Authorization (EUA). This EUA will remain in effect (meaning this test can be used) for the duration of the COVID-19 declaration under Section 564(b)(1) of the Act, 21 U.S.C. section 360bbb-3(b)(1), unless the authorization is terminated or revoked.  Performed at [facility], [place] [address], [place], [place] [address]   Blood Culture (routine x 2)     Status: None   Collection Time: 01/13/21  2:50 PM   Specimen: BLOOD  Result Value Ref Range Status   Specimen Description BLOOD LEFT ANTECUBITAL  Final   Special Requests   Final    BOTTLES DRAWN AEROBIC AND ANAEROBIC Blood Culture adequate volume   Culture   Final    NO GROWTH 5 DAYS Performed at [facility], [place] [address] [place], [place] [address]    Report Status 01/18/2021 FINAL  Final  Urine Culture     Status: None   Collection Time: 01/13/21  5:29 PM   Specimen: In/Out Cath Urine  Result Value Ref Range Status   Specimen Description IN/OUT CATH URINE  Final   Special Requests NONE  Final   Culture   Final    NO GROWTH Performed at [facility], [place] [address] [place], [place] [address]    Report Status 01/14/2021 FINAL  Final  Blood Culture (routine x 2)     Status: None   Collection Time: 01/14/21  8:22 AM   Specimen: BLOOD  Result Value Ref Range Status   Specimen Description BLOOD LEFT ANTECUBITAL  Final   Special Requests   Final    BOTTLES DRAWN AEROBIC AND ANAEROBIC Blood Culture adequate volume   Culture   Final    NO GROWTH 5 DAYS Performed at [facility], [place] [address], [place], [place] [address]     Report Status 01/19/2021 FINAL  Final  Resp Panel by RT-PCR (Flu A&B, Covid) Nasopharyngeal Swab     Status: None   Collection Time: 01/18/21  6:33 AM   Specimen: Nasopharyngeal Swab; Nasopharyngeal(NP) swabs in vial transport medium  Result Value Ref Range Status   SARS Coronavirus 2 by RT PCR NEGATIVE NEGATIVE Final    Comment: (NOTE) SARS-CoV-2 target nucleic acids are NOT DETECTED.  The SARS-CoV-2 RNA is generally detectable in upper respiratory specimens during the acute phase of infection. The lowest concentration of SARS-CoV-2 viral copies this assay can detect is 138 copies/mL. A negative result does not preclude SARS-Cov-2 infection and should not be used as the sole basis for treatment or other patient management decisions. A negative result may occur with  improper specimen  collection/handling, submission of specimen other than nasopharyngeal swab, presence of viral mutation(s) within the areas targeted by this assay, and inadequate number of viral copies(<138 copies/mL). A negative result must be combined with clinical observations, patient history, and epidemiological information. The expected result is Negative.  Fact Sheet for Patients:  EntrepreneurPulse.com.au  Fact Sheet for Healthcare Providers:  IncredibleEmployment.be  This test is no t yet approved or cleared by the [place] FDA and  has been authorized for detection and/or diagnosis of SARS-CoV-2 by FDA under an Emergency Use Authorization (EUA). This EUA will remain  in effect (meaning this test can be used) for the duration of the COVID-19 declaration under Section 564(b)(1) of the Act, 21 U.S.C.section 360bbb-3(b)(1), unless the authorization is terminated  or revoked sooner.       Influenza A by PCR NEGATIVE NEGATIVE Final   Influenza B by PCR NEGATIVE NEGATIVE Final    Comment: (NOTE) The Xpert Xpress SARS-CoV-2/FLU/RSV plus assay is intended as an aid in  the diagnosis of influenza from Nasopharyngeal swab specimens and should not be used as a sole basis for treatment. Nasal washings and aspirates are unacceptable for Xpert Xpress SARS-CoV-2/FLU/RSV testing.  Fact Sheet for Patients: EntrepreneurPulse.com.au  Fact Sheet for Healthcare Providers: IncredibleEmployment.be  This test is not yet approved or cleared by the [place] FDA and has been authorized for detection and/or diagnosis of SARS-CoV-2 by FDA under an Emergency Use Authorization (EUA). This EUA will remain in effect (meaning this test can be used) for the duration of the COVID-19 declaration under Section 564(b)(1) of the Act, 21 U.S.C. section 360bbb-3(b)(1), unless the authorization is terminated or revoked.  Performed at [facility], [place] [address], [place], [place] [address]   Gastrointestinal Panel by PCR , Stool     Status: None   Collection Time: 01/18/21  1:09 PM   Specimen: Stool  Result Value Ref Range Status   Campylobacter species NOT DETECTED NOT DETECTED Final   Plesimonas shigelloides NOT DETECTED NOT DETECTED Final   Salmonella species NOT DETECTED NOT DETECTED Final   Yersinia enterocolitica NOT DETECTED NOT DETECTED Final   Vibrio species NOT DETECTED NOT DETECTED Final   Vibrio cholerae NOT DETECTED NOT DETECTED Final   Enteroaggregative E coli (EAEC) NOT DETECTED NOT DETECTED Final   Enteropathogenic E coli (EPEC) NOT DETECTED NOT DETECTED Final   Enterotoxigenic E coli (ETEC) NOT DETECTED NOT DETECTED Final   Shiga like toxin producing E coli (STEC) NOT DETECTED NOT DETECTED Final   Shigella/Enteroinvasive E coli (EIEC) NOT DETECTED NOT DETECTED Final   Cryptosporidium NOT DETECTED NOT DETECTED Final   Cyclospora cayetanensis NOT DETECTED NOT DETECTED Final   Entamoeba histolytica NOT DETECTED NOT DETECTED Final   Giardia lamblia NOT DETECTED NOT DETECTED Final   Adenovirus F40/41 NOT DETECTED NOT  DETECTED Final   Astrovirus NOT DETECTED NOT DETECTED Final   Norovirus GI/GII NOT DETECTED NOT DETECTED Final   Rotavirus A NOT DETECTED NOT DETECTED Final   Sapovirus (I, II, IV, and V) NOT DETECTED NOT DETECTED Final    Comment: Performed at [facility], [place] [place], [place] [address]  C Difficile Quick Screen w PCR reflex     Status: None   Collection Time: 01/18/21  1:09 PM   Specimen: Stool  Result Value Ref Range Status   C Diff antigen NEGATIVE NEGATIVE Final   C Diff toxin NEGATIVE NEGATIVE Final   C Diff interpretation No C. difficile detected.  Final  Comment: Performed at [facility], [place] [address], [place], [place] [address]  Resp Panel by RT-PCR (Flu A&B, Covid) Nasopharyngeal Swab     Status: None   Collection Time: 01/20/21  2:32 PM   Specimen: Nasopharyngeal Swab; Nasopharyngeal(NP) swabs in vial transport medium  Result Value Ref Range Status   SARS Coronavirus 2 by RT PCR NEGATIVE NEGATIVE Final    Comment: (NOTE) SARS-CoV-2 target nucleic acids are NOT DETECTED.  The SARS-CoV-2 RNA is generally detectable in upper respiratory specimens during the acute phase of infection. The lowest concentration of SARS-CoV-2 viral copies this assay can detect is 138 copies/mL. A negative result does not preclude SARS-Cov-2 infection and should not be used as the sole basis for treatment or other patient management decisions. A negative result may occur with  improper specimen collection/handling, submission of specimen other than nasopharyngeal swab, presence of viral mutation(s) within the areas targeted by this assay, and inadequate number of viral copies(<138 copies/mL). A negative result must be combined with clinical observations, patient history, and epidemiological information. The expected result is Negative.  Fact Sheet for Patients:  EntrepreneurPulse.com.au  Fact Sheet for Healthcare Providers:   IncredibleEmployment.be  This test is no t yet approved or cleared by the [place] FDA and  has been authorized for detection and/or diagnosis of SARS-CoV-2 by FDA under an Emergency Use Authorization (EUA). This EUA will remain  in effect (meaning this test can be used) for the duration of the COVID-19 declaration under Section 564(b)(1) of the Act, 21 U.S.C.section 360bbb-3(b)(1), unless the authorization is terminated  or revoked sooner.       Influenza A by PCR NEGATIVE NEGATIVE Final   Influenza B by PCR NEGATIVE NEGATIVE Final    Comment: (NOTE) The Xpert Xpress SARS-CoV-2/FLU/RSV plus assay is intended as an aid in the diagnosis of influenza from Nasopharyngeal swab specimens and should not be used as a sole basis for treatment. Nasal washings and aspirates are unacceptable for Xpert Xpress SARS-CoV-2/FLU/RSV testing.  Fact Sheet for Patients: EntrepreneurPulse.com.au  Fact Sheet for Healthcare Providers: IncredibleEmployment.be  This test is not yet approved or cleared by the [place] FDA and has been authorized for detection and/or diagnosis of SARS-CoV-2 by FDA under an Emergency Use Authorization (EUA). This EUA will remain in effect (meaning this test can be used) for the duration of the COVID-19 declaration under Section 564(b)(1) of the Act, 21 U.S.C. section 360bbb-3(b)(1), unless the authorization is terminated or revoked.  Performed at [facility], [place] [address], [place],  [address]      Labs: BNP (last 3 results) No results for input(s): BNP in the last 8760 hours. Basic Metabolic Panel: Recent Labs  Lab 01/16/21 0525 01/17/21 0501 01/17/21 2353 01/18/21 1220 01/18/21 1313 01/19/21 0037 01/20/21 0645 01/20/21 1235 01/21/21 0840  NA 146* 140   < > 136  --  134* 136 134* 139  K 3.2* 3.0*   < > 3.5  --  3.2* 2.9* 3.5 3.6  CL 116* 109   < > 103  --  105 104 102 105   CO2 24 23   < > 24  --  24 28 26 25   GLUCOSE 100* 76   < > 72  --  78 83 146* 94  BUN 46* 21   < > 13  --  14 10 10 11   CREATININE 1.34* 1.06*   < > 0.88  --  0.88 0.81 0.81 0.83  CALCIUM 9.1  8.8*   < > 8.7*  --  8.2* 8.5* 8.5* 8.7*  MG 1.8 1.3*  --   --  1.9 1.5* 1.8  --   --    < > = values in this interval not displayed.   Liver Function Tests: Recent Labs  Lab 01/15/21 0831 01/16/21 0525 01/17/21 0501 01/17/21 2353  AST 59* 35 30 33  ALT 162* 117* 82* 76*  ALKPHOS 69 77 76 82  BILITOT 1.1 0.5 0.6 0.8  PROT 4.7* 5.0* 4.8* 5.3*  ALBUMIN 2.4* 2.3* 2.3* 2.6*   No results for input(s): LIPASE, AMYLASE in the last 168 hours. No results for input(s): AMMONIA in the last 168 hours. CBC: Recent Labs  Lab 01/15/21 0806 01/16/21 0525 01/17/21 0501 01/17/21 2353 01/19/21 0037  WBC 11.1* 10.6* 12.1* 14.2* 12.6*  NEUTROABS  --   --   --  11.5*  --   HGB 11.6* 12.3 12.4 14.2 12.2  HCT 36.0 38.6 38.4 42.6 36.2  MCV 96.0 97.0 96.0 94.0 91.9  PLT 207 223 234 253 269   Cardiac Enzymes: Recent Labs  Lab 01/15/21 0806  CKTOTAL 100   BNP: Invalid input(s): POCBNP CBG: Recent Labs  Lab 01/16/21 1136 01/16/21 2135 01/17/21 0007 01/17/21 0558 01/17/21 1134  GLUCAP 101* 108* 88 74 81   D-Dimer No results for input(s): DDIMER in the last 72 hours. Hgb A1c No results for input(s): HGBA1C in the last 72 hours. Lipid Profile No results for input(s): CHOL, HDL, LDLCALC, TRIG, CHOLHDL, LDLDIRECT in the last 72 hours. Thyroid function studies Recent Labs    01/18/21 1313  TSH 12.822*   Anemia work up No results for input(s): VITAMINB12, FOLATE, FERRITIN, TIBC, IRON, RETICCTPCT in the last 72 hours. Urinalysis    Component Value Date/Time   COLORURINE AMBER (A) 01/13/2021 1729   APPEARANCEUR HAZY (A) 01/13/2021 1729   LABSPEC 1.015 01/13/2021 1729   PHURINE 5.0 01/13/2021 1729   GLUCOSEU NEGATIVE 01/13/2021 1729   HGBUR NEGATIVE 01/13/2021 1729   BILIRUBINUR  NEGATIVE 01/13/2021 1729   KETONESUR NEGATIVE 01/13/2021 1729   PROTEINUR NEGATIVE 01/13/2021 1729   UROBILINOGEN 0.2 11/12/2013 2233   NITRITE NEGATIVE 01/13/2021 1729   LEUKOCYTESUR NEGATIVE 01/13/2021 1729   Sepsis Labs Invalid input(s): PROCALCITONIN,  WBC,  LACTICIDVEN Microbiology Recent Results (from the past 240 hour(s))  Resp Panel by RT-PCR (Flu A&B, Covid) Nasopharyngeal Swab     Status: None   Collection Time: 01/13/21  2:18 PM   Specimen: Nasopharyngeal Swab; Nasopharyngeal(NP) swabs in vial transport medium  Result Value Ref Range Status   SARS Coronavirus 2 by RT PCR NEGATIVE NEGATIVE Final    Comment: (NOTE) SARS-CoV-2 target nucleic acids are NOT DETECTED.  The SARS-CoV-2 RNA is generally detectable in upper respiratory specimens during the acute phase of infection. The lowest concentration of SARS-CoV-2 viral copies this assay can detect is 138 copies/mL. A negative result does not preclude SARS-Cov-2 infection and should not be used as the sole basis for treatment or other patient management decisions. A negative result may occur with  improper specimen collection/handling, submission of specimen other than nasopharyngeal swab, presence of viral mutation(s) within the areas targeted by this assay, and inadequate number of viral copies(<138 copies/mL). A negative result must be combined with clinical observations, patient history, and epidemiological information. The expected result is Negative.  Fact Sheet for Patients:  EntrepreneurPulse.com.au  Fact Sheet for Healthcare Providers:  IncredibleEmployment.be  This test is no t yet approved or cleared by  the [employer] and  has been authorized for detection and/or diagnosis of SARS-CoV-2 by FDA under an Emergency Use Authorization (EUA). This EUA will remain  in effect (meaning this test can be used) for the duration of the COVID-19 declaration under Section  564(b)(1) of the Act, 21 U.S.C.section 360bbb-3(b)(1), unless the authorization is terminated  or revoked sooner.       Influenza A by PCR NEGATIVE NEGATIVE Final   Influenza B by PCR NEGATIVE NEGATIVE Final    Comment: (NOTE) The Xpert Xpress SARS-CoV-2/FLU/RSV plus assay is intended as an aid in the diagnosis of influenza from Nasopharyngeal swab specimens and should not be used as a sole basis for treatment. Nasal washings and aspirates are unacceptable for Xpert Xpress SARS-CoV-2/FLU/RSV testing.  Fact Sheet for Patients: EntrepreneurPulse.com.au  Fact Sheet for Healthcare Providers: IncredibleEmployment.be  This test is not yet approved or cleared by the [place] FDA and has been authorized for detection and/or diagnosis of SARS-CoV-2 by FDA under an Emergency Use Authorization (EUA). This EUA will remain in effect (meaning this test can be used) for the duration of the COVID-19 declaration under Section 564(b)(1) of the Act, 21 U.S.C. section 360bbb-3(b)(1), unless the authorization is terminated or revoked.  Performed at [facility], [place] [address] [place], [place] [address]   Blood Culture (routine x 2)     Status: None   Collection Time: 01/13/21  2:50 PM   Specimen: BLOOD  Result Value Ref Range Status   Specimen Description BLOOD LEFT ANTECUBITAL  Final   Special Requests   Final    BOTTLES DRAWN AEROBIC AND ANAEROBIC Blood Culture adequate volume   Culture   Final    NO GROWTH 5 DAYS Performed at [facility], [place] [address] [place], [place] [address]    Report Status 01/18/2021 FINAL  Final  Urine Culture     Status: None   Collection Time: 01/13/21  5:29 PM   Specimen: In/Out Cath Urine  Result Value Ref Range Status   Specimen Description IN/OUT CATH URINE  Final   Special Requests NONE  Final   Culture   Final    NO GROWTH Performed at [facility], [place] [address], [place], [place]  [address]    Report Status 01/14/2021 FINAL  Final  Blood Culture (routine x 2)     Status: None   Collection Time: 01/14/21  8:22 AM   Specimen: BLOOD  Result Value Ref Range Status   Specimen Description BLOOD LEFT ANTECUBITAL  Final   Special Requests   Final    BOTTLES DRAWN AEROBIC AND ANAEROBIC Blood Culture adequate volume   Culture   Final    NO GROWTH 5 DAYS Performed at [facility], [place] [address], [place], [place] [address]    Report Status 01/19/2021 FINAL  Final  Resp Panel by RT-PCR (Flu A&B, Covid) Nasopharyngeal Swab     Status: None   Collection Time: 01/18/21  6:33 AM   Specimen: Nasopharyngeal Swab; Nasopharyngeal(NP) swabs in vial transport medium  Result Value Ref Range Status   SARS Coronavirus 2 by RT PCR NEGATIVE NEGATIVE Final    Comment: (NOTE) SARS-CoV-2 target nucleic acids are NOT DETECTED.  The SARS-CoV-2 RNA is generally detectable in upper respiratory specimens during the acute phase of infection. The lowest concentration of SARS-CoV-2 viral copies this assay can detect is 138 copies/mL. A negative result does not preclude SARS-Cov-2 infection and should not be used  as the sole basis for treatment or other patient management decisions. A negative result may occur with  improper specimen collection/handling, submission of specimen other than nasopharyngeal swab, presence of viral mutation(s) within the areas targeted by this assay, and inadequate number of viral copies(<138 copies/mL). A negative result must be combined with clinical observations, patient history, and epidemiological information. The expected result is Negative.  Fact Sheet for Patients:  EntrepreneurPulse.com.au  Fact Sheet for Healthcare Providers:  IncredibleEmployment.be  This test is no t yet approved or cleared by the [place] FDA and  has been authorized for detection and/or diagnosis of SARS-CoV-2 by FDA under an Emergency  Use Authorization (EUA). This EUA will remain  in effect (meaning this test can be used) for the duration of the COVID-19 declaration under Section 564(b)(1) of the Act, 21 U.S.C.section 360bbb-3(b)(1), unless the authorization is terminated  or revoked sooner.       Influenza A by PCR NEGATIVE NEGATIVE Final   Influenza B by PCR NEGATIVE NEGATIVE Final    Comment: (NOTE) The Xpert Xpress SARS-CoV-2/FLU/RSV plus assay is intended as an aid in the diagnosis of influenza from Nasopharyngeal swab specimens and should not be used as a sole basis for treatment. Nasal washings and aspirates are unacceptable for Xpert Xpress SARS-CoV-2/FLU/RSV testing.  Fact Sheet for Patients: EntrepreneurPulse.com.au  Fact Sheet for Healthcare Providers: IncredibleEmployment.be  This test is not yet approved or cleared by the [place] FDA and has been authorized for detection and/or diagnosis of SARS-CoV-2 by FDA under an Emergency Use Authorization (EUA). This EUA will remain in effect (meaning this test can be used) for the duration of the COVID-19 declaration under Section 564(b)(1) of the Act, 21 U.S.C. section 360bbb-3(b)(1), unless the authorization is terminated or revoked.  Performed at [facility], [place] [address], [place], [place] [address]   Gastrointestinal Panel by PCR , Stool     Status: None   Collection Time: 01/18/21  1:09 PM   Specimen: Stool  Result Value Ref Range Status   Campylobacter species NOT DETECTED NOT DETECTED Final   Plesimonas shigelloides NOT DETECTED NOT DETECTED Final   Salmonella species NOT DETECTED NOT DETECTED Final   Yersinia enterocolitica NOT DETECTED NOT DETECTED Final   Vibrio species NOT DETECTED NOT DETECTED Final   Vibrio cholerae NOT DETECTED NOT DETECTED Final   Enteroaggregative E coli (EAEC) NOT DETECTED NOT DETECTED Final   Enteropathogenic E coli (EPEC) NOT DETECTED NOT DETECTED Final    Enterotoxigenic E coli (ETEC) NOT DETECTED NOT DETECTED Final   Shiga like toxin producing E coli (STEC) NOT DETECTED NOT DETECTED Final   Shigella/Enteroinvasive E coli (EIEC) NOT DETECTED NOT DETECTED Final   Cryptosporidium NOT DETECTED NOT DETECTED Final   Cyclospora cayetanensis NOT DETECTED NOT DETECTED Final   Entamoeba histolytica NOT DETECTED NOT DETECTED Final   Giardia lamblia NOT DETECTED NOT DETECTED Final   Adenovirus F40/41 NOT DETECTED NOT DETECTED Final   Astrovirus NOT DETECTED NOT DETECTED Final   Norovirus GI/GII NOT DETECTED NOT DETECTED Final   Rotavirus A NOT DETECTED NOT DETECTED Final   Sapovirus (I, II, IV, and V) NOT DETECTED NOT DETECTED Final    Comment: Performed at [facility], [place] [place], [place] [address]  C Difficile Quick Screen w PCR reflex     Status: None   Collection Time: 01/18/21  1:09 PM   Specimen: Stool  Result Value Ref Range Status   C Diff antigen NEGATIVE NEGATIVE  Final   C Diff toxin NEGATIVE NEGATIVE Final   C Diff interpretation No C. difficile detected.  Final    Comment: Performed at [facility], [place] [address] [place], [place] [address]  Resp Panel by RT-PCR (Flu A&B, Covid) Nasopharyngeal Swab     Status: None   Collection Time: 01/20/21  2:32 PM   Specimen: Nasopharyngeal Swab; Nasopharyngeal(NP) swabs in vial transport medium  Result Value Ref Range Status   SARS Coronavirus 2 by RT PCR NEGATIVE NEGATIVE Final    Comment: (NOTE) SARS-CoV-2 target nucleic acids are NOT DETECTED.  The SARS-CoV-2 RNA is generally detectable in upper respiratory specimens during the acute phase of infection. The lowest concentration of SARS-CoV-2 viral copies this assay can detect is 138 copies/mL. A negative result does not preclude SARS-Cov-2 infection and should not be used as the sole basis for treatment or other patient management decisions. A negative result may occur with  improper specimen  collection/handling, submission of specimen other than nasopharyngeal swab, presence of viral mutation(s) within the areas targeted by this assay, and inadequate number of viral copies(<138 copies/mL). A negative result must be combined with clinical observations, patient history, and epidemiological information. The expected result is Negative.  Fact Sheet for Patients:  EntrepreneurPulse.com.au  Fact Sheet for Healthcare Providers:  IncredibleEmployment.be  This test is no t yet approved or cleared by the [place] FDA and  has been authorized for detection and/or diagnosis of SARS-CoV-2 by FDA under an Emergency Use Authorization (EUA). This EUA will remain  in effect (meaning this test can be used) for the duration of the COVID-19 declaration under Section 564(b)(1) of the Act, 21 U.S.C.section 360bbb-3(b)(1), unless the authorization is terminated  or revoked sooner.       Influenza A by PCR NEGATIVE NEGATIVE Final   Influenza B by PCR NEGATIVE NEGATIVE Final    Comment: (NOTE) The Xpert Xpress SARS-CoV-2/FLU/RSV plus assay is intended as an aid in the diagnosis of influenza from Nasopharyngeal swab specimens and should not be used as a sole basis for treatment. Nasal washings and aspirates are unacceptable for Xpert Xpress SARS-CoV-2/FLU/RSV testing.  Fact Sheet for Patients: EntrepreneurPulse.com.au  Fact Sheet for Healthcare Providers: IncredibleEmployment.be  This test is not yet approved or cleared by the [place] FDA and has been authorized for detection and/or diagnosis of SARS-CoV-2 by FDA under an Emergency Use Authorization (EUA). This EUA will remain in effect (meaning this test can be used) for the duration of the COVID-19 declaration under Section 564(b)(1) of the Act, 21 U.S.C. section 360bbb-3(b)(1), unless the authorization is terminated or revoked.  Performed at [facility], [place] [address], [place],  [address]      Time coordinating discharge: 40 minutes  SIGNED:   Elmarie Shiley, MD  [facility]

## 2021-01-22 DIAGNOSIS — R197 Diarrhea, unspecified: Secondary | ICD-10-CM | POA: Diagnosis not present

## 2021-01-22 LAB — BASIC METABOLIC PANEL
Anion gap: 5 (ref 5–15)
BUN: 12 mg/dL (ref 8–23)
CO2: 28 mmol/L (ref 22–32)
Calcium: 8.6 mg/dL — ABNORMAL LOW (ref 8.9–10.3)
Chloride: 102 mmol/L (ref 98–111)
Creatinine, Ser: 0.82 mg/dL (ref 0.44–1.00)
GFR, Estimated: >60 mL/min (ref >60)
Glucose, Bld: 103 mg/dL — ABNORMAL HIGH (ref 70–99)
Potassium: 3.5 mmol/L (ref 3.5–5.1)
Sodium: 135 mmol/L (ref 135–145)

## 2021-01-22 MED ORDER — POTASSIUM CHLORIDE CRYS ER 20 MEQ PO TBCR
20.0000 meq | EXTENDED_RELEASE_TABLET | Freq: Once | ORAL | Status: AC
  Administered 2021-01-22: 20 meq via ORAL
  Filled 2021-01-22: qty 1

## 2021-01-22 MED ORDER — QUETIAPINE FUMARATE 50 MG PO TABS
400.0000 mg | ORAL_TABLET | Freq: Every evening | ORAL | Status: DC
  Administered 2021-01-22 – 2021-01-25 (×4): 400 mg via ORAL
  Filled 2021-01-22 (×4): qty 8

## 2021-01-22 NOTE — Progress Notes (Signed)
PROGRESS NOTE    Victoria Levine  FKC:[id number] DOB: 1950/04/05 DOA: 01/17/2021 PCP: [name], MD   Brief Narrative: 70 year old with past medical history significant for hypothyroidism, asthma, GERD, osteoporosis, anxiety, alcohol use who presents complaining of diarrhea and weakness.  Patient had just been hospitalized from 10/28 until 11/1 for weakness and confusion.  She initially met SIRS criteria and had been treated with empiric antibiotics, but antibiotics were discontinued as no source could be found.  She was found to have multiple electrolytes abnormality in addition to suspected prerenal acute kidney injury secondary to dehydration.  She was treated with IV fluids and her electrolytes improved.  She was also found to have a TSH of 15 and free T4 of 5.5.  Patient presented with worsening diarrhea. C. difficile antigen negative GI pathogen negative.  Diarrhea has improved.  PT has seen her again and is recommending skilled rehab   Assessment & Plan:   Principal Problem:   Diarrhea Active Problems:   Hypothyroidism   Bipolar 1 disorder, depressed (HCC)   Chronic pain   Hypokalemia   Leukocytosis   Transaminitis   Generalized weakness  1-Nausea vomiting and diarrhea acute: C. Diff  rule out Likely related to gastroenteritis. Symptoms  have improved. Continue with supportive care Tolerating diet.   2-Hypokalemia/ hypomagnesemia; replaced.   Leukocytosis: trending down.   Generalized weakness: She will benefit from rehab.  She agreed to go to rehab.  Hypothyroidism: Advised patient to stop taking biotin supplements. Repeated TSH: down to 12. Free t4 0.81 Continue with current dose of Synthroid  Chronic pain, chronic opioid use: UDS last hospitalization was negative.  Depression: Continue with Prozac Wellbutrin and Seroquel  Transaminases: Trending down Edema; no focal edema on [place].   Awaiting rehab.    Pressure Injury 03/09/20 Sacrum Posterior  Stage 1 -  Intact skin with non-blanchable redness of a localized area usually over a bony prominence. dressing from nursing home in place on the sacrum, but to the right stage 1 noted.  (Active)  03/09/20 1600  Location: Sacrum  Location Orientation: Posterior  Staging: Stage 1 -  Intact skin with non-blanchable redness of a localized area usually over a bony prominence.  Wound Description (Comments): dressing from nursing home in place on the sacrum, but to the right stage 1 noted.   Present on Admission: Yes      Estimated body mass index is 25.28 kg/m as calculated from the following:   Height as of this encounter: '5\' 5"'  (1.651 m).   Weight as of this encounter: 68.9 kg.   DVT prophylaxis: Lovenox Code Status: DNR Family Communication: Discussed with patient Disposition Plan:  Status is: Observation  The patient remains OBS appropriate and will d/c before 2 midnights.       Consultants:  None  Procedures:    Antimicrobials:    Subjective: She is alert , report got too early her Seroquel.    Objective: Vitals:   01/21/21 2042 01/21/21 2043 01/22/21 0552 01/22/21 0747  BP: 112/67 112/67 (!) 147/90 139/74  Pulse: 94 94 71 73  Resp: '18  18 19  ' Temp: 99.1 F (37.3 C) 99.1 F (37.3 C) 98.4 F (36.9 C) 99 F (37.2 C)  TempSrc: Oral Oral Oral Oral  SpO2: 94% 94% 99% 93%  Weight:      Height:        Intake/Output Summary (Last 24 hours) at 01/22/2021 1412 Last data filed at 01/21/2021 2040 Gross per 24 hour  Intake --  Output 350 ml  Net -350 ml    Filed Weights   01/19/21 0856  Weight: 68.9 kg    Examination:  General exam: NAD Respiratory system: CTA Cardiovascular system: S 1, s 2 RRR Gastrointestinal system: BS present, soft, nt Central nervous system: alert Extremities: Symmetric power  Data Reviewed: I have personally reviewed following labs and imaging studies  CBC: Recent Labs  Lab 01/16/21 0525 01/17/21 0501 01/17/21 2353  01/19/21 0037  WBC 10.6* 12.1* 14.2* 12.6*  NEUTROABS  --   --  11.5*  --   HGB 12.3 12.4 14.2 12.2  HCT 38.6 38.4 42.6 36.2  MCV 97.0 96.0 94.0 91.9  PLT 223 234 253 828    Basic Metabolic Panel: Recent Labs  Lab 01/16/21 0525 01/17/21 0501 01/17/21 2353 01/18/21 1313 01/19/21 0037 01/20/21 0645 01/20/21 1235 01/21/21 0840 01/22/21 0928  NA 146* 140   < >  --  134* 136 134* 139 135  K 3.2* 3.0*   < >  --  3.2* 2.9* 3.5 3.6 3.5  CL 116* 109   < >  --  105 104 102 105 102  CO2 24 23   < >  --  '24 28 26 25 28  ' GLUCOSE 100* 76   < >  --  78 83 146* 94 103*  BUN 46* 21   < >  --  '14 10 10 11 12  ' CREATININE 1.34* 1.06*   < >  --  0.88 0.81 0.81 0.83 0.82  CALCIUM 9.1 8.8*   < >  --  8.2* 8.5* 8.5* 8.7* 8.6*  MG 1.8 1.3*  --  1.9 1.5* 1.8  --   --   --    < > = values in this interval not displayed.    GFR: Estimated Creatinine Clearance: 61.4 mL/min (by C-G formula based on SCr of 0.82 mg/dL). Liver Function Tests: Recent Labs  Lab 01/16/21 0525 01/17/21 0501 01/17/21 2353  AST 35 30 33  ALT 117* 82* 76*  ALKPHOS 77 76 82  BILITOT 0.5 0.6 0.8  PROT 5.0* 4.8* 5.3*  ALBUMIN 2.3* 2.3* 2.6*    No results for input(s): LIPASE, AMYLASE in the last 168 hours. No results for input(s): AMMONIA in the last 168 hours. Coagulation Profile: No results for input(s): INR, PROTIME in the last 168 hours.  Cardiac Enzymes: No results for input(s): CKTOTAL, CKMB, CKMBINDEX, TROPONINI in the last 168 hours.  BNP (last 3 results) No results for input(s): PROBNP in the last 8760 hours. HbA1C: No results for input(s): HGBA1C in the last 72 hours. CBG: Recent Labs  Lab 01/16/21 1136 01/16/21 2135 01/17/21 0007 01/17/21 0558 01/17/21 1134  GLUCAP 101* 108* 88 74 81    Lipid Profile: No results for input(s): CHOL, HDL, LDLCALC, TRIG, CHOLHDL, LDLDIRECT in the last 72 hours. Thyroid Function Tests: No results for input(s): TSH, T4TOTAL, FREET4, T3FREE, THYROIDAB in the  last 72 hours.  Anemia Panel: No results for input(s): VITAMINB12, FOLATE, FERRITIN, TIBC, IRON, RETICCTPCT in the last 72 hours. Sepsis Labs: No results for input(s): PROCALCITON, LATICACIDVEN in the last 168 hours.   Recent Results (from the past 240 hour(s))  Resp Panel by RT-PCR (Flu A&B, Covid) Nasopharyngeal Swab     Status: None   Collection Time: 01/13/21  2:18 PM   Specimen: Nasopharyngeal Swab; Nasopharyngeal(NP) swabs in vial transport medium  Result Value Ref Range Status   SARS Coronavirus 2 by RT PCR NEGATIVE NEGATIVE Final  Comment: (NOTE) SARS-CoV-2 target nucleic acids are NOT DETECTED.  The SARS-CoV-2 RNA is generally detectable in upper respiratory specimens during the acute phase of infection. The lowest concentration of SARS-CoV-2 viral copies this assay can detect is 138 copies/mL. A negative result does not preclude SARS-Cov-2 infection and should not be used as the sole basis for treatment or other patient management decisions. A negative result may occur with  improper specimen collection/handling, submission of specimen other than nasopharyngeal swab, presence of viral mutation(s) within the areas targeted by this assay, and inadequate number of viral copies(<138 copies/mL). A negative result must be combined with clinical observations, patient history, and epidemiological information. The expected result is Negative.  Fact Sheet for Patients:  EntrepreneurPulse.com.au  Fact Sheet for Healthcare Providers:  IncredibleEmployment.be  This test is no t yet approved or cleared by the [place] FDA and  has been authorized for detection and/or diagnosis of SARS-CoV-2 by FDA under an Emergency Use Authorization (EUA). This EUA will remain  in effect (meaning this test can be used) for the duration of the COVID-19 declaration under Section 564(b)(1) of the Act, 21 U.S.C.section 360bbb-3(b)(1), unless the  authorization is terminated  or revoked sooner.       Influenza A by PCR NEGATIVE NEGATIVE Final   Influenza B by PCR NEGATIVE NEGATIVE Final    Comment: (NOTE) The Xpert Xpress SARS-CoV-2/FLU/RSV plus assay is intended as an aid in the diagnosis of influenza from Nasopharyngeal swab specimens and should not be used as a sole basis for treatment. Nasal washings and aspirates are unacceptable for Xpert Xpress SARS-CoV-2/FLU/RSV testing.  Fact Sheet for Patients: EntrepreneurPulse.com.au  Fact Sheet for Healthcare Providers: IncredibleEmployment.be  This test is not yet approved or cleared by the [place] FDA and has been authorized for detection and/or diagnosis of SARS-CoV-2 by FDA under an Emergency Use Authorization (EUA). This EUA will remain in effect (meaning this test can be used) for the duration of the COVID-19 declaration under Section 564(b)(1) of the Act, 21 U.S.C. section 360bbb-3(b)(1), unless the authorization is terminated or revoked.  Performed at [facility], [place] [address], [place], [place] [address]   Blood Culture (routine x 2)     Status: None   Collection Time: 01/13/21  2:50 PM   Specimen: BLOOD  Result Value Ref Range Status   Specimen Description BLOOD LEFT ANTECUBITAL  Final   Special Requests   Final    BOTTLES DRAWN AEROBIC AND ANAEROBIC Blood Culture adequate volume   Culture   Final    NO GROWTH 5 DAYS Performed at [facility], [place] [address] [place], [place] [address]    Report Status 01/18/2021 FINAL  Final  Urine Culture     Status: None   Collection Time: 01/13/21  5:29 PM   Specimen: In/Out Cath Urine  Result Value Ref Range Status   Specimen Description IN/OUT CATH URINE  Final   Special Requests NONE  Final   Culture   Final    NO GROWTH Performed at [facility], [place] [address], [place], [place] [address]    Report Status 01/14/2021 FINAL  Final  Blood Culture  (routine x 2)     Status: None   Collection Time: 01/14/21  8:22 AM   Specimen: BLOOD  Result Value Ref Range Status   Specimen Description BLOOD LEFT ANTECUBITAL  Final   Special Requests   Final    BOTTLES DRAWN AEROBIC AND ANAEROBIC Blood Culture adequate  volume   Culture   Final    NO GROWTH 5 DAYS Performed at [facility], [place] [address] [place], [place] [address]    Report Status 01/19/2021 FINAL  Final  Resp Panel by RT-PCR (Flu A&B, Covid) Nasopharyngeal Swab     Status: None   Collection Time: 01/18/21  6:33 AM   Specimen: Nasopharyngeal Swab; Nasopharyngeal(NP) swabs in vial transport medium  Result Value Ref Range Status   SARS Coronavirus 2 by RT PCR NEGATIVE NEGATIVE Final    Comment: (NOTE) SARS-CoV-2 target nucleic acids are NOT DETECTED.  The SARS-CoV-2 RNA is generally detectable in upper respiratory specimens during the acute phase of infection. The lowest concentration of SARS-CoV-2 viral copies this assay can detect is 138 copies/mL. A negative result does not preclude SARS-Cov-2 infection and should not be used as the sole basis for treatment or other patient management decisions. A negative result may occur with  improper specimen collection/handling, submission of specimen other than nasopharyngeal swab, presence of viral mutation(s) within the areas targeted by this assay, and inadequate number of viral copies(<138 copies/mL). A negative result must be combined with clinical observations, patient history, and epidemiological information. The expected result is Negative.  Fact Sheet for Patients:  EntrepreneurPulse.com.au  Fact Sheet for Healthcare Providers:  IncredibleEmployment.be  This test is no t yet approved or cleared by the [place] FDA and  has been authorized for detection and/or diagnosis of SARS-CoV-2 by FDA under an Emergency Use Authorization (EUA). This EUA will remain  in effect (meaning  this test can be used) for the duration of the COVID-19 declaration under Section 564(b)(1) of the Act, 21 U.S.C.section 360bbb-3(b)(1), unless the authorization is terminated  or revoked sooner.       Influenza A by PCR NEGATIVE NEGATIVE Final   Influenza B by PCR NEGATIVE NEGATIVE Final    Comment: (NOTE) The Xpert Xpress SARS-CoV-2/FLU/RSV plus assay is intended as an aid in the diagnosis of influenza from Nasopharyngeal swab specimens and should not be used as a sole basis for treatment. Nasal washings and aspirates are unacceptable for Xpert Xpress SARS-CoV-2/FLU/RSV testing.  Fact Sheet for Patients: EntrepreneurPulse.com.au  Fact Sheet for Healthcare Providers: IncredibleEmployment.be  This test is not yet approved or cleared by the [place] FDA and has been authorized for detection and/or diagnosis of SARS-CoV-2 by FDA under an Emergency Use Authorization (EUA). This EUA will remain in effect (meaning this test can be used) for the duration of the COVID-19 declaration under Section 564(b)(1) of the Act, 21 U.S.C. section 360bbb-3(b)(1), unless the authorization is terminated or revoked.  Performed at [facility], [place] [address] [place], [place] [address]   Gastrointestinal Panel by PCR , Stool     Status: None   Collection Time: 01/18/21  1:09 PM   Specimen: Stool  Result Value Ref Range Status   Campylobacter species NOT DETECTED NOT DETECTED Final   Plesimonas shigelloides NOT DETECTED NOT DETECTED Final   Salmonella species NOT DETECTED NOT DETECTED Final   Yersinia enterocolitica NOT DETECTED NOT DETECTED Final   Vibrio species NOT DETECTED NOT DETECTED Final   Vibrio cholerae NOT DETECTED NOT DETECTED Final   Enteroaggregative E coli (EAEC) NOT DETECTED NOT DETECTED Final   Enteropathogenic E coli (EPEC) NOT DETECTED NOT DETECTED Final   Enterotoxigenic E coli (ETEC) NOT DETECTED NOT DETECTED Final   Shiga like  toxin producing E coli (STEC) NOT DETECTED NOT DETECTED Final   Shigella/Enteroinvasive E coli (  EIEC) NOT DETECTED NOT DETECTED Final   Cryptosporidium NOT DETECTED NOT DETECTED Final   Cyclospora cayetanensis NOT DETECTED NOT DETECTED Final   Entamoeba histolytica NOT DETECTED NOT DETECTED Final   Giardia lamblia NOT DETECTED NOT DETECTED Final   Adenovirus F40/41 NOT DETECTED NOT DETECTED Final   Astrovirus NOT DETECTED NOT DETECTED Final   Norovirus GI/GII NOT DETECTED NOT DETECTED Final   Rotavirus A NOT DETECTED NOT DETECTED Final   Sapovirus (I, II, IV, and V) NOT DETECTED NOT DETECTED Final    Comment: Performed at [facility], [address] [place], [place] [address]  C Difficile Quick Screen w PCR reflex     Status: None   Collection Time: 01/18/21  1:09 PM   Specimen: Stool  Result Value Ref Range Status   C Diff antigen NEGATIVE NEGATIVE Final   C Diff toxin NEGATIVE NEGATIVE Final   C Diff interpretation No C. difficile detected.  Final    Comment: Performed at [facility], [place] [address], [place], [place] [address]  Resp Panel by RT-PCR (Flu A&B, Covid) Nasopharyngeal Swab     Status: None   Collection Time: 01/20/21  2:32 PM   Specimen: Nasopharyngeal Swab; Nasopharyngeal(NP) swabs in vial transport medium  Result Value Ref Range Status   SARS Coronavirus 2 by RT PCR NEGATIVE NEGATIVE Final    Comment: (NOTE) SARS-CoV-2 target nucleic acids are NOT DETECTED.  The SARS-CoV-2 RNA is generally detectable in upper respiratory specimens during the acute phase of infection. The lowest concentration of SARS-CoV-2 viral copies this assay can detect is 138 copies/mL. A negative result does not preclude SARS-Cov-2 infection and should not be used as the sole basis for treatment or other patient management decisions. A negative result may occur with  improper specimen collection/handling, submission of specimen other than nasopharyngeal swab, presence of  viral mutation(s) within the areas targeted by this assay, and inadequate number of viral copies(<138 copies/mL). A negative result must be combined with clinical observations, patient history, and epidemiological information. The expected result is Negative.  Fact Sheet for Patients:  EntrepreneurPulse.com.au  Fact Sheet for Healthcare Providers:  IncredibleEmployment.be  This test is no t yet approved or cleared by the [place] FDA and  has been authorized for detection and/or diagnosis of SARS-CoV-2 by FDA under an Emergency Use Authorization (EUA). This EUA will remain  in effect (meaning this test can be used) for the duration of the COVID-19 declaration under Section 564(b)(1) of the Act, 21 U.S.C.section 360bbb-3(b)(1), unless the authorization is terminated  or revoked sooner.       Influenza A by PCR NEGATIVE NEGATIVE Final   Influenza B by PCR NEGATIVE NEGATIVE Final    Comment: (NOTE) The Xpert Xpress SARS-CoV-2/FLU/RSV plus assay is intended as an aid in the diagnosis of influenza from Nasopharyngeal swab specimens and should not be used as a sole basis for treatment. Nasal washings and aspirates are unacceptable for Xpert Xpress SARS-CoV-2/FLU/RSV testing.  Fact Sheet for Patients: EntrepreneurPulse.com.au  Fact Sheet for Healthcare Providers: IncredibleEmployment.be  This test is not yet approved or cleared by the [place] FDA and has been authorized for detection and/or diagnosis of SARS-CoV-2 by FDA under an Emergency Use Authorization (EUA). This EUA will remain in effect (meaning this test can be used) for the duration of the COVID-19 declaration under Section 564(b)(1) of the Act, 21 U.S.C. section 360bbb-3(b)(1), unless the authorization is terminated or revoked.  Performed at [facility], [address] [place], [place] [address]           Radiology  Studies: No results found.      Scheduled Meds:  buPROPion  150 mg Oral BID   enoxaparin (LOVENOX) injection  40 mg Subcutaneous Q24H   famotidine  20 mg Oral QHS   FLUoxetine  20 mg Oral BID   levothyroxine  125 mcg Oral QAC breakfast   oxcarbazepine  600 mg Oral BID   oxyCODONE  10 mg Oral Q12H   potassium chloride  20 mEq Oral Once   QUEtiapine  400 mg Oral QPM   Continuous Infusions:     LOS: 0 days    Time spent: 35 minutes    Jasamine Pottinger A Iain Sawchuk, MD [facility]   If 7PM-7AM, please contact night-coverage [email or web address]  01/22/2021, 2:12 PM

## 2021-01-22 NOTE — Plan of Care (Signed)

## 2021-01-22 NOTE — TOC Progression Note (Addendum)
Transition of Care [facility]) - Progression Note    Patient Details  Name: Victoria Levine MRN: [id number] Date of Birth: 07/07/1950  Transition of Care [facility]) CM/SW [place], [place] Phone Number: 01/22/2021, 12:25 PM  Clinical Narrative:     Patient has SNF bed at [facility]. CSW to call [facility] must tomorrow to follow up on PASSR status for patient. MD and Juliann Pulse with [facility] updated. CSW will continue to follow and assist with patients dc planning needs.       Expected Discharge Plan and Services           Expected Discharge Date: 01/20/21                                     Social Determinants of Health (SDOH) Interventions    Readmission Risk Interventions Readmission Risk Prevention Plan 02/25/2020  Transportation Screening Complete  Medication Review [employer]) Complete  PCP or Specialist appointment within 3-5 days of discharge Complete  SW Recovery Care/Counseling Consult Complete  Skilled Nursing Facility Complete  Some recent data might be hidden

## 2021-01-22 NOTE — Plan of Care (Signed)
  Problem: Education: Goal: Knowledge of General Education information will improve Description: Including pain rating scale, medication(s)/side effects and non-pharmacologic comfort measures Outcome: Progressing   Problem: Clinical Measurements: Goal: Will remain free from infection Outcome: Progressing Goal: Diagnostic test results will improve Outcome: Progressing   

## 2021-01-23 DIAGNOSIS — R197 Diarrhea, unspecified: Secondary | ICD-10-CM | POA: Diagnosis not present

## 2021-01-23 MED ORDER — POTASSIUM CHLORIDE CRYS ER 20 MEQ PO TBCR
40.0000 meq | EXTENDED_RELEASE_TABLET | Freq: Once | ORAL | Status: AC
  Administered 2021-01-23: 40 meq via ORAL
  Filled 2021-01-23: qty 2

## 2021-01-23 NOTE — Social Work (Signed)
To Whom It May Concern:  Please be advised that the above-named patient will require a short-term nursing home stay - anticipated 30 days or less for rehabilitation and strengthening.  The plan is for return home.  

## 2021-01-23 NOTE — Consult Note (Signed)
[facility] Inpatient Consult   01/23/2021  Victoria Levine Aug 31, 1950 [id number]  [place] Organization [ACO] Patient: [name]   Patient chart has been reviewed for unplanned readmission less than 30 days. Patient assessed for community [place] Management follow up needs.  Per review, current disposition is for skilled nursing facility. Patient needs will be met at SNF level of care.  Of note, [facility] Care Management services does not replace or interfere with any services that are arranged by inpatient case management or social work.   Netta Cedars, MSN, RN [facility] [employer] [phone]  Toll free office [phone]

## 2021-01-23 NOTE — Progress Notes (Signed)
Physical Therapy Treatment Patient Details Name: Victoria Levine MRN: [id number] DOB: 1950-08-08 Today's Date: 01/23/2021   History of Present Illness 70 y/o female admitted secondary to diarrhea and inability to care for herself at home on 11/1 after being discharged the same day. Pt with recent admission for AKI and rhabdomyolysis. PMH includes alcohol abuse, bipolar disorder, osteoporosis and anxiety.    PT Comments    Patient progressing slowly towards PT goals. Reports feeling dizzy and having abdominal pain due to just having finished chewing/eating however agreeable to participate in mobility. Requires Min A for transfers and short distance ambulation in room with RW. Poor awareness of safety/deficits. No dizziness reported during mobility. Worked on functional strengthening of LEs.  Continues to be appropriate for SNF. Will follow.   Recommendations for follow up therapy are one component of a multi-disciplinary discharge planning process, led by the attending physician.  Recommendations may be updated based on patient status, additional functional criteria and insurance authorization.  Follow Up Recommendations  Skilled nursing-short term rehab (<3 hours/day)     Assistance Recommended at Discharge Frequent or constant Supervision/Assistance  Equipment Recommendations  Wheelchair (measurements PT);Wheelchair cushion (measurements PT);Hospital bed    Recommendations for Other Services       Precautions / Restrictions Precautions Precautions: Fall Precaution Comments: monitor BP (reports stars and dizziness today with standing) Restrictions Weight Bearing Restrictions: No     Mobility  Bed Mobility Overal bed mobility: Needs Assistance Bed Mobility: Supine to Sit;Sit to Supine Rolling: Supervision   Supine to sit: Supervision;HOB elevated Sit to supine: Supervision;HOB elevated   General bed mobility comments: Increased time, use of rail.    Transfers Overall  transfer level: Needs assistance Equipment used: Rolling walker (2 wheels) Transfers: Sit to/from Stand Sit to Stand: Min assist;Min guard Stand pivot transfers: Min assist         General transfer comment: Min A initially progressing to min guard with cues for hand placement. Stood from [employer].    Ambulation/Gait Ambulation/Gait assistance: Min assist Gait Distance (Feet): 20 Feet Assistive device: Rolling walker (2 wheels) Gait Pattern/deviations: Step-through pattern;Decreased stride length Gait velocity: Decreased     General Gait Details: Slow, mildly unsteady gait wtih bil knee instability but no buckling. No dizziness reported.   Stairs             Wheelchair Mobility    Modified Rankin (Stroke Patients Only)       Balance Overall balance assessment: Needs assistance Sitting-balance support: No upper extremity supported;Feet supported Sitting balance-Leahy Scale: Good     Standing balance support: During functional activity Standing balance-Leahy Scale: Poor Standing balance comment: Reliant on UE support                            Cognition Arousal/Alertness: Awake/alert Behavior During Therapy: WFL for tasks assessed/performed Overall Cognitive Status: No family/caregiver present to determine baseline cognitive functioning Area of Impairment: Safety/judgement;Following commands                 Orientation Level:  ("where am I now", but remembered my turtle name badge and that the last time she saw me I was wearing my halloween shirt.)     Following Commands: Follows one step commands with increased time Safety/Judgement: Decreased awareness of safety     General Comments: Baseline bipolar disorder. Pt tangiental in conversation. Easily redirectable. Reports her mesh underwear are soaking wet, however would not let therapist help  change them, "it will be alright, it will dry as soon as I lay back down."        Exercises       General Comments General comments (skin integrity, edema, etc.): No dizziness reported today.      Pertinent Vitals/Pain Pain Assessment: Faces Faces Pain Scale: Hurts a little bit Pain Location: abdomen Pain Descriptors / Indicators: Cramping Pain Intervention(s): Monitored during session    Home Living                          Prior Function            PT Goals (current goals can now be found in the care plan section) Progress towards PT goals: Progressing toward goals (slowly)    Frequency    Min 2X/week      PT Plan Current plan remains appropriate    Co-evaluation              AM-PAC PT "6 Clicks" Mobility   Outcome Measure  Help needed turning from your back to your side while in a flat bed without using bedrails?: A Little Help needed moving from lying on your back to sitting on the side of a flat bed without using bedrails?: A Little Help needed moving to and from a bed to a chair (including a wheelchair)?: A Little Help needed standing up from a chair using your arms (e.g., wheelchair or bedside chair)?: A Little Help needed to walk in hospital room?: A Lot Help needed climbing 3-5 steps with a railing? : A Lot 6 Click Score: 16    End of Session Equipment Utilized During Treatment: Gait belt Activity Tolerance: Patient limited by fatigue Patient left: in bed;with call bell/phone within reach;with bed alarm set Nurse Communication: Mobility status PT Visit Diagnosis: Unsteadiness on feet (R26.81);Difficulty in walking, not elsewhere classified (R26.2);History of falling (Z91.81);Repeated falls (R29.6);Muscle weakness (generalized) (M62.81)     Time: 1438-1500 PT Time Calculation (min) (ACUTE ONLY): 22 min  Charges:  $Therapeutic Activity: 8-22 mins                     Marisa Severin, PT, DPT Acute Rehabilitation Services Pager [phone] Office [place] 01/23/2021, 3:21 PM

## 2021-01-23 NOTE — TOC Progression Note (Signed)
Transition of Care [facility]) - Progression Note    Patient Details  Name: Victoria Levine MRN: [id number] Date of Birth: February 11, 1951  Transition of Care [facility] CM/SW [place], [place] Phone Number: 01/23/2021, 10:26 AM  Clinical Narrative:    CSW followed up with PASSR. They stated they had no record that pt's PASSR was started, though it is documented on our end. CSW resubmitted PASSR, it went to a level 2 again. CSW submitted additional clinicals and updated facility and MD, will continue to follow.         Expected Discharge Plan and Services           Expected Discharge Date: 01/20/21                                     Social Determinants of Health (SDOH) Interventions    Readmission Risk Interventions Readmission Risk Prevention Plan 02/25/2020  Transportation Screening Complete  Medication Review Press photographer) Complete  PCP or Specialist appointment within 3-5 days of discharge Complete  SW Recovery Care/Counseling Consult Complete  Skilled Nursing Facility Complete  Some recent data might be hidden

## 2021-01-23 NOTE — Progress Notes (Signed)
PROGRESS NOTE    Victoria Levine  JQG:[id number] DOB: 14-Aug-1950 DOA: 01/17/2021 PCP: [name], MD   Brief Narrative: 70 year old with past medical history significant for hypothyroidism, asthma, GERD, osteoporosis, anxiety, alcohol use who presents complaining of diarrhea and weakness.  Patient had just been hospitalized from 10/28 until 11/1 for weakness and confusion.  She initially met SIRS criteria and had been treated with empiric antibiotics, but antibiotics were discontinued as no source could be found.  She was found to have multiple electrolytes abnormality in addition to suspected prerenal acute kidney injury secondary to dehydration.  She was treated with IV fluids and her electrolytes improved.  She was also found to have a TSH of 15 and free T4 of 5.5.  Patient presented with worsening diarrhea. C. difficile antigen negative GI pathogen negative.  Diarrhea has improved.  PT has seen her again and is recommending skilled rehab   Assessment & Plan:   Principal Problem:   Diarrhea Active Problems:   Hypothyroidism   Bipolar 1 disorder, depressed (HCC)   Chronic pain   Hypokalemia   Leukocytosis   Transaminitis   Generalized weakness  1-Nausea vomiting and diarrhea acute: C. Diff  rule out Likely related to gastroenteritis. Symptoms  have resolved.  Continue with supportive care Tolerating diet.   2-Hypokalemia/ hypomagnesemia; replaced.   Leukocytosis: trending down.   Generalized weakness: She will benefit from rehab.  She agreed to go to rehab.  Hypothyroidism: Advised patient to stop taking biotin supplements. Repeated TSH: down to 12. Free t4 0.81 She has been taking her synthroid. Plan to increase synthroid to 125 mcg.   Chronic pain, chronic opioid use: UDS last hospitalization was negative.  Depression: Continue with Prozac Wellbutrin and Seroquel  Transaminases: Trending down Edema; no focal edema on [place].   Awaiting rehab.    Pressure  Injury 03/09/20 Sacrum Posterior Stage 1 -  Intact skin with non-blanchable redness of a localized area usually over a bony prominence. dressing from nursing home in place on the sacrum, but to the right stage 1 noted.  (Active)  03/09/20 1600  Location: Sacrum  Location Orientation: Posterior  Staging: Stage 1 -  Intact skin with non-blanchable redness of a localized area usually over a bony prominence.  Wound Description (Comments): dressing from nursing home in place on the sacrum, but to the right stage 1 noted.   Present on Admission: Yes      Estimated body mass index is 25.28 kg/m as calculated from the following:   Height as of this encounter: '5\' 5"'  (1.651 m).   Weight as of this encounter: 68.9 kg.   DVT prophylaxis: Lovenox Code Status: DNR Family Communication: Discussed with patient Disposition Plan:  Status is: Observation  The patient remains OBS appropriate and will d/c before 2 midnights.       Consultants:  None  Procedures:    Antimicrobials:    Subjective: Alert, denies pain.  Awaiting to go to rehab.   Objective: Vitals:   01/22/21 1819 01/22/21 2125 01/23/21 0505 01/23/21 0752  BP: (!) 176/98 (!) 141/78 (!) 143/88 (!) 133/95  Pulse: 79 88 81 79  Resp: '18 17 18 16  ' Temp: 99.2 F (37.3 C) 98.2 F (36.8 C) 98.2 F (36.8 C) 98.1 F (36.7 C)  TempSrc: Oral Oral Oral Oral  SpO2: 97%  97% 98%  Weight:      Height:        Intake/Output Summary (Last 24 hours) at 01/23/2021 1617 Last data  filed at 01/23/2021 0508 Gross per 24 hour  Intake 360 ml  Output 1300 ml  Net -940 ml    Filed Weights   01/19/21 0856  Weight: 68.9 kg    Examination:  General exam: NAD Respiratory system: CTA Cardiovascular system: S 1, S  2 RRR Gastrointestinal system: BS present, soft, nt Central nervous system: Alert Extremities: Symmetric power  Data Reviewed: I have personally reviewed following labs and imaging studies  CBC: Recent Labs  Lab  01/17/21 0501 01/17/21 2353 01/19/21 0037  WBC 12.1* 14.2* 12.6*  NEUTROABS  --  11.5*  --   HGB 12.4 14.2 12.2  HCT 38.4 42.6 36.2  MCV 96.0 94.0 91.9  PLT 234 253 570    Basic Metabolic Panel: Recent Labs  Lab 01/17/21 0501 01/17/21 2353 01/18/21 1313 01/19/21 0037 01/20/21 0645 01/20/21 1235 01/21/21 0840 01/22/21 0928  NA 140   < >  --  134* 136 134* 139 135  K 3.0*   < >  --  3.2* 2.9* 3.5 3.6 3.5  CL 109   < >  --  105 104 102 105 102  CO2 23   < >  --  '24 28 26 25 28  ' GLUCOSE 76   < >  --  78 83 146* 94 103*  BUN 21   < >  --  '14 10 10 11 12  ' CREATININE 1.06*   < >  --  0.88 0.81 0.81 0.83 0.82  CALCIUM 8.8*   < >  --  8.2* 8.5* 8.5* 8.7* 8.6*  MG 1.3*  --  1.9 1.5* 1.8  --   --   --    < > = values in this interval not displayed.    GFR: Estimated Creatinine Clearance: 61.4 mL/min (by C-G formula based on SCr of 0.82 mg/dL). Liver Function Tests: Recent Labs  Lab 01/17/21 0501 01/17/21 2353  AST 30 33  ALT 82* 76*  ALKPHOS 76 82  BILITOT 0.6 0.8  PROT 4.8* 5.3*  ALBUMIN 2.3* 2.6*    No results for input(s): LIPASE, AMYLASE in the last 168 hours. No results for input(s): AMMONIA in the last 168 hours. Coagulation Profile: No results for input(s): INR, PROTIME in the last 168 hours.  Cardiac Enzymes: No results for input(s): CKTOTAL, CKMB, CKMBINDEX, TROPONINI in the last 168 hours.  BNP (last 3 results) No results for input(s): PROBNP in the last 8760 hours. HbA1C: No results for input(s): HGBA1C in the last 72 hours. CBG: Recent Labs  Lab 01/16/21 2135 01/17/21 0007 01/17/21 0558 01/17/21 1134  GLUCAP 108* 88 74 81    Lipid Profile: No results for input(s): CHOL, HDL, LDLCALC, TRIG, CHOLHDL, LDLDIRECT in the last 72 hours. Thyroid Function Tests: No results for input(s): TSH, T4TOTAL, FREET4, T3FREE, THYROIDAB in the last 72 hours.  Anemia Panel: No results for input(s): VITAMINB12, FOLATE, FERRITIN, TIBC, IRON, RETICCTPCT in the  last 72 hours. Sepsis Labs: No results for input(s): PROCALCITON, LATICACIDVEN in the last 168 hours.   Recent Results (from the past 240 hour(s))  Urine Culture     Status: None   Collection Time: 01/13/21  5:29 PM   Specimen: In/Out Cath Urine  Result Value Ref Range Status   Specimen Description IN/OUT CATH URINE  Final   Special Requests NONE  Final   Culture   Final    NO GROWTH Performed at [facility], [address] [place], [place] [address]  Report Status 01/14/2021 FINAL  Final  Blood Culture (routine x 2)     Status: None   Collection Time: 01/14/21  8:22 AM   Specimen: BLOOD  Result Value Ref Range Status   Specimen Description BLOOD LEFT ANTECUBITAL  Final   Special Requests   Final    BOTTLES DRAWN AEROBIC AND ANAEROBIC Blood Culture adequate volume   Culture   Final    NO GROWTH 5 DAYS Performed at [facility], [place] [address] [place], [place] [address]    Report Status 01/19/2021 FINAL  Final  Resp Panel by RT-PCR (Flu A&B, Covid) Nasopharyngeal Swab     Status: None   Collection Time: 01/18/21  6:33 AM   Specimen: Nasopharyngeal Swab; Nasopharyngeal(NP) swabs in vial transport medium  Result Value Ref Range Status   SARS Coronavirus 2 by RT PCR NEGATIVE NEGATIVE Final    Comment: (NOTE) SARS-CoV-2 target nucleic acids are NOT DETECTED.  The SARS-CoV-2 RNA is generally detectable in upper respiratory specimens during the acute phase of infection. The lowest concentration of SARS-CoV-2 viral copies this assay can detect is 138 copies/mL. A negative result does not preclude SARS-Cov-2 infection and should not be used as the sole basis for treatment or other patient management decisions. A negative result may occur with  improper specimen collection/handling, submission of specimen other than nasopharyngeal swab, presence of viral mutation(s) within the areas targeted by this assay, and inadequate number of viral copies(<138 copies/mL). A  negative result must be combined with clinical observations, patient history, and epidemiological information. The expected result is Negative.  Fact Sheet for Patients:  EntrepreneurPulse.com.au  Fact Sheet for Healthcare Providers:  IncredibleEmployment.be  This test is no t yet approved or cleared by the [place] FDA and  has been authorized for detection and/or diagnosis of SARS-CoV-2 by FDA under an Emergency Use Authorization (EUA). This EUA will remain  in effect (meaning this test can be used) for the duration of the COVID-19 declaration under Section 564(b)(1) of the Act, 21 U.S.C.section 360bbb-3(b)(1), unless the authorization is terminated  or revoked sooner.       Influenza A by PCR NEGATIVE NEGATIVE Final   Influenza B by PCR NEGATIVE NEGATIVE Final    Comment: (NOTE) The Xpert Xpress SARS-CoV-2/FLU/RSV plus assay is intended as an aid in the diagnosis of influenza from Nasopharyngeal swab specimens and should not be used as a sole basis for treatment. Nasal washings and aspirates are unacceptable for Xpert Xpress SARS-CoV-2/FLU/RSV testing.  Fact Sheet for Patients: EntrepreneurPulse.com.au  Fact Sheet for Healthcare Providers: IncredibleEmployment.be  This test is not yet approved or cleared by the [place] FDA and has been authorized for detection and/or diagnosis of SARS-CoV-2 by FDA under an Emergency Use Authorization (EUA). This EUA will remain in effect (meaning this test can be used) for the duration of the COVID-19 declaration under Section 564(b)(1) of the Act, 21 U.S.C. section 360bbb-3(b)(1), unless the authorization is terminated or revoked.  Performed at [facility], [place] [address], [place], [place] [address]   Gastrointestinal Panel by PCR , Stool     Status: None   Collection Time: 01/18/21  1:09 PM   Specimen: Stool  Result Value Ref Range Status    Campylobacter species NOT DETECTED NOT DETECTED Final   Plesimonas shigelloides NOT DETECTED NOT DETECTED Final   Salmonella species NOT DETECTED NOT DETECTED Final   Yersinia enterocolitica NOT DETECTED NOT DETECTED Final   Vibrio species NOT DETECTED NOT  DETECTED Final   Vibrio cholerae NOT DETECTED NOT DETECTED Final   Enteroaggregative E coli (EAEC) NOT DETECTED NOT DETECTED Final   Enteropathogenic E coli (EPEC) NOT DETECTED NOT DETECTED Final   Enterotoxigenic E coli (ETEC) NOT DETECTED NOT DETECTED Final   Shiga like toxin producing E coli (STEC) NOT DETECTED NOT DETECTED Final   Shigella/Enteroinvasive E coli (EIEC) NOT DETECTED NOT DETECTED Final   Cryptosporidium NOT DETECTED NOT DETECTED Final   Cyclospora cayetanensis NOT DETECTED NOT DETECTED Final   Entamoeba histolytica NOT DETECTED NOT DETECTED Final   Giardia lamblia NOT DETECTED NOT DETECTED Final   Adenovirus F40/41 NOT DETECTED NOT DETECTED Final   Astrovirus NOT DETECTED NOT DETECTED Final   Norovirus GI/GII NOT DETECTED NOT DETECTED Final   Rotavirus A NOT DETECTED NOT DETECTED Final   Sapovirus (I, II, IV, and V) NOT DETECTED NOT DETECTED Final    Comment: Performed at [facility], [address] [place], [place] [address]  C Difficile Quick Screen w PCR reflex     Status: None   Collection Time: 01/18/21  1:09 PM   Specimen: Stool  Result Value Ref Range Status   C Diff antigen NEGATIVE NEGATIVE Final   C Diff toxin NEGATIVE NEGATIVE Final   C Diff interpretation No C. difficile detected.  Final    Comment: Performed at [facility], [place] [address] [place], [place] [address]  Resp Panel by RT-PCR (Flu A&B, Covid) Nasopharyngeal Swab     Status: None   Collection Time: 01/20/21  2:32 PM   Specimen: Nasopharyngeal Swab; Nasopharyngeal(NP) swabs in vial transport medium  Result Value Ref Range Status   SARS Coronavirus 2 by RT PCR NEGATIVE NEGATIVE Final    Comment: (NOTE) SARS-CoV-2 target  nucleic acids are NOT DETECTED.  The SARS-CoV-2 RNA is generally detectable in upper respiratory specimens during the acute phase of infection. The lowest concentration of SARS-CoV-2 viral copies this assay can detect is 138 copies/mL. A negative result does not preclude SARS-Cov-2 infection and should not be used as the sole basis for treatment or other patient management decisions. A negative result may occur with  improper specimen collection/handling, submission of specimen other than nasopharyngeal swab, presence of viral mutation(s) within the areas targeted by this assay, and inadequate number of viral copies(<138 copies/mL). A negative result must be combined with clinical observations, patient history, and epidemiological information. The expected result is Negative.  Fact Sheet for Patients:  EntrepreneurPulse.com.au  Fact Sheet for Healthcare Providers:  IncredibleEmployment.be  This test is no t yet approved or cleared by the [place] FDA and  has been authorized for detection and/or diagnosis of SARS-CoV-2 by FDA under an Emergency Use Authorization (EUA). This EUA will remain  in effect (meaning this test can be used) for the duration of the COVID-19 declaration under Section 564(b)(1) of the Act, 21 U.S.C.section 360bbb-3(b)(1), unless the authorization is terminated  or revoked sooner.       Influenza A by PCR NEGATIVE NEGATIVE Final   Influenza B by PCR NEGATIVE NEGATIVE Final    Comment: (NOTE) The Xpert Xpress SARS-CoV-2/FLU/RSV plus assay is intended as an aid in the diagnosis of influenza from Nasopharyngeal swab specimens and should not be used as a sole basis for treatment. Nasal washings and aspirates are unacceptable for Xpert Xpress SARS-CoV-2/FLU/RSV testing.  Fact Sheet for Patients: EntrepreneurPulse.com.au  Fact Sheet for Healthcare  Providers: IncredibleEmployment.be  This test is not yet approved or cleared by the [place]  States FDA and has been authorized for detection and/or diagnosis of SARS-CoV-2 by FDA under an Emergency Use Authorization (EUA). This EUA will remain in effect (meaning this test can be used) for the duration of the COVID-19 declaration under Section 564(b)(1) of the Act, 21 U.S.C. section 360bbb-3(b)(1), unless the authorization is terminated or revoked.  Performed at [facility], [place] [address] [place], [place] [address]           Radiology Studies: No results found.      Scheduled Meds:  buPROPion  150 mg Oral BID   enoxaparin (LOVENOX) injection  40 mg Subcutaneous Q24H   famotidine  20 mg Oral QHS   FLUoxetine  20 mg Oral BID   levothyroxine  125 mcg Oral QAC breakfast   oxcarbazepine  600 mg Oral BID   oxyCODONE  10 mg Oral Q12H   potassium chloride  40 mEq Oral Once   QUEtiapine  400 mg Oral QPM   Continuous Infusions:     LOS: 0 days    Time spent: 35 minutes    Eyonna Sandstrom A Eligah Anello, MD [facility]   If 7PM-7AM, please contact night-coverage [email or web address]  01/23/2021, 4:17 PM

## 2021-01-23 NOTE — Progress Notes (Signed)
Occupational Therapy Treatment Patient Details Name: Victoria Levine MRN: [id number] DOB: 1950/09/29 Today's Date: 01/23/2021   History of present illness 70 y/o female admitted secondary to diarrhea and inability to care for herself at home on 11/1 after being discharged the same day. Pt with recent admission for AKI and rhabdomyolysis. PMH includes alcohol abuse, bipolar disorder, osteoporosis and anxiety.   OT comments  This 70 yo female seen today with hopeful to ambulate to bathroom to stand at sink and do grooming tasks; however her c/o of dizziness and seeing "stars" when standing changed the plan to sitting EOB for 2 tasks and standing for one at bedside. (See BP's below).Pt will continue to benefit from acute OT with follow up at SNF to work towards getting back home to her cat.   Recommendations for follow up therapy are one component of a multi-disciplinary discharge planning process, led by the attending physician.  Recommendations may be updated based on patient status, additional functional criteria and insurance authorization.    Follow Up Recommendations  Skilled nursing-short term rehab (<3 hours/day)    Assistance Recommended at Discharge Frequent or constant Supervision/Assistance  Equipment Recommendations  Other (comment) (TBD next venue)       Precautions / Restrictions Precautions Precautions: Fall Precaution Comments: monitor BP (reports stars and dizziness today with standing) Restrictions Weight Bearing Restrictions: No       Mobility Bed Mobility Overal bed mobility: Needs Assistance Bed Mobility: Supine to Sit Rolling: Supervision         General bed mobility comments: Increased time    Transfers Overall transfer level: Needs assistance Equipment used: Rolling walker (2 wheels) Transfers: Sit to/from Stand Sit to Stand: Min assist Stand pivot transfers: Min assist         General transfer comment: VCs for safe hand placement      Balance Overall balance assessment: Needs assistance Sitting-balance support: No upper extremity supported;Feet supported Sitting balance-Leahy Scale: Good     Standing balance support: Single extremity supported;During functional activity Standing balance-Leahy Scale: Poor                             ADL either performed or assessed with clinical judgement   ADL Overall ADL's : Needs assistance/impaired     Grooming: Wash/dry face;Oral care;Brushing hair Grooming Details (indicate cue type and reason): sitting at EOB for wash face and oral care, stand at bedside table to brush hair             Lower Body Dressing: Supervision/safety;Set up Lower Body Dressing Details (indicate cue type and reason): min A sit<>stand Toilet Transfer: Minimal assistance;Stand-pivot;Rolling walker (2 wheels) Toilet Transfer Details (indicate cue type and reason): Bed > recliner 3 feet away                Extremity/Trunk Assessment Upper Extremity Assessment Upper Extremity Assessment: Overall WFL for tasks assessed            Vision Baseline Vision/History: 1 Wears glasses Patient Visual Report: No change from baseline            Cognition Arousal/Alertness: Awake/alert Behavior During Therapy: WFL for tasks assessed/performed Overall Cognitive Status: No family/caregiver present to determine baseline cognitive functioning Area of Impairment: Orientation;Memory;Following commands;Safety/judgement                 Orientation Level:  ("where am I now", but remembered my turtle name badge and that the last time  she saw me I was wearing my halloween shirt.)     Following Commands: Follows one step commands with increased time Safety/Judgement: Decreased awareness of safety     General Comments: Baseline bipolar disorder. Slow to move even though states she wants to get OOB. Pt tangiental in conversation.                General Comments Pt wtih c/o  dizziness and 'stars" when she stood up. Returned to sitting took blood pressure 146/99 HR 83; standing  129/84 HR 85; could not stand long enough to get 3 minute standing BP.    Pertinent Vitals/ Pain       Pain Assessment: No/denies pain         Frequency  Min 2X/week        Progress Toward Goals  OT Goals(current goals can now be found in the care plan section)  Progress towards OT goals: Progressing toward goals  Acute Rehab OT Goals Patient Stated Goal: to go to rehab today and then get home to cat OT Goal Formulation: With patient Time For Goal Achievement: 02/02/21 Potential to Achieve Goals: Good  Plan Discharge plan remains appropriate       AM-PAC OT "6 Clicks" Daily Activity     Outcome Measure   Help from another person eating meals?: None Help from another person taking care of personal grooming?: A Little Help from another person toileting, which includes using toliet, bedpan, or urinal?: A Little Help from another person bathing (including washing, rinsing, drying)?: A Little Help from another person to put on and taking off regular upper body clothing?: A Little Help from another person to put on and taking off regular lower body clothing?: A Little 6 Click Score: 19    End of Session Equipment Utilized During Treatment: Gait belt;Rolling walker (2 wheels)  OT Visit Diagnosis: Other abnormalities of gait and mobility (R26.89);Muscle weakness (generalized) (M62.81);Other symptoms and signs involving cognitive function   Activity Tolerance  (limited by dizziness and "stars" in standing BP did drop)   Patient Left in chair;with call bell/phone within reach;with chair alarm set           Time: 0823-0900 OT Time Calculation (min): 37 min  Charges: OT General Charges $OT Visit: 1 Visit OT Treatments $Self Care/Home Management : 23-37 mins  Golden Circle, OTR/L Acute [employer] Pager [phone] Office [phone]    Almon Register 01/23/2021, 12:05 PM

## 2021-01-24 DIAGNOSIS — R197 Diarrhea, unspecified: Secondary | ICD-10-CM | POA: Diagnosis not present

## 2021-01-24 LAB — BASIC METABOLIC PANEL
Anion gap: 6 (ref 5–15)
BUN: 12 mg/dL (ref 8–23)
CO2: 26 mmol/L (ref 22–32)
Calcium: 8.8 mg/dL — ABNORMAL LOW (ref 8.9–10.3)
Chloride: 101 mmol/L (ref 98–111)
Creatinine, Ser: 0.83 mg/dL (ref 0.44–1.00)
GFR, Estimated: >60 mL/min (ref >60)
Glucose, Bld: 83 mg/dL (ref 70–99)
Potassium: 3.9 mmol/L (ref 3.5–5.1)
Sodium: 133 mmol/L — ABNORMAL LOW (ref 135–145)

## 2021-01-24 LAB — RESP PANEL BY RT-PCR (FLU A&B, COVID) ARPGX2
Influenza A by PCR: NEGATIVE
Influenza B by PCR: NEGATIVE
SARS Coronavirus 2 by RT PCR: NEGATIVE

## 2021-01-24 NOTE — TOC Progression Note (Addendum)
Transition of Care [facility]) - Progression Note    Patient Details  Name: Victoria Levine MRN: [id number] Date of Birth: 21-Jan-1951  Transition of Care [facility]) CM/SW Contact  Joanne Chars, LCSW Phone Number: 01/24/2021, 10:22 AM  Clinical Narrative:   Message in [place] Must: need H and P, FL2 resubmitted.  Docs uploaded in [place].  1210: Another message in [place] Must: need 30 day note.  Doc uploaded.           Expected Discharge Plan and Services           Expected Discharge Date: 01/20/21                                     Social Determinants of Health (SDOH) Interventions    Readmission Risk Interventions Readmission Risk Prevention Plan 02/25/2020  Transportation Screening Complete  Medication Review [employer]) Complete  PCP or Specialist appointment within 3-5 days of discharge Complete  SW Recovery Care/Counseling Consult Complete  Skilled Nursing Facility Complete  Some recent data might be hidden

## 2021-01-24 NOTE — Progress Notes (Signed)
Mobility Specialist Progress Note:    01/24/21 0950  Mobility  Activity Transferred:  Bed to chair  Level of Assistance Contact guard assist, steadying assist  Assistive Device Front wheel walker  Distance Ambulated (ft) 3 ft  Mobility Out of bed to chair with meals  Mobility Response Tolerated well  Mobility performed by Mobility specialist  Bed Position Chair   Pt c/o significant dizziness upon sitting EOB. Agreeable to sitting in chair. Pivot transfer to chair.   Nelta Numbers Mobility Specialist  Phone [phone]

## 2021-01-24 NOTE — Discharge Summary (Signed)
Physician Discharge Summary  DEAMBER BUCKHALTER EQA:[id number] DOB: 1950/07/15 DOA: 01/17/2021  PCP: [name], MD  Admit date: 01/17/2021 Discharge date: 01/24/2021  Admitted From:  Home  Disposition:  SNF  Recommendations for Outpatient Follow-up:  Follow up with PCP in 1-2 weeks Please obtain BMP/CBC in one week Needs follow up with Endocrinologist in 2 -4 weeks.  Monitor K level. Replete as needed.   Discharge Condition: Stable.  CODE STATUS: DNR Diet recommendation: Heart Healthy   Brief/Interim Summary:  70 year old with past medical history significant for hypothyroidism, asthma, GERD, osteoporosis, anxiety, alcohol use who presents complaining of diarrhea and weakness.  Patient had just been hospitalized from 10/28 until 11/1 for weakness and confusion.  She initially met SIRS criteria and had been treated with empiric antibiotics, but antibiotics were discontinued as no source could be found.  She was found to have multiple electrolytes abnormality in addition to suspected prerenal acute kidney injury secondary to dehydration.  She was treated with IV fluids and her electrolytes improved.  She was also found to have a TSH of 15 and free T4 of 5.5.  Patient presented with worsening diarrhea. C. difficile antigen negative GI pathogen negative.  Diarrhea has improved.  PT has seen her again and is recommending skilled rehab.   1-Nausea vomiting and diarrhea acute: C. Diff  rule out Likely related to gastroenteritis. Resolved.  Continue with supportive care.   2-Hypokalemia/ hypomagnesemia; Replaced.      Leukocytosis: trending down.    Generalized weakness: She will benefit from rehab.  She agreed to go to rehab.   Hypothyroidism: Advised patient to stop taking biotin supplements. Repeated TSH: down to 12. Free t4 0.81 She has been compliant with her medication. Increase synthroid to 125 mcg.    Chronic pain, chronic opioid use: UDS last hospitalization was  negative.   Depression: Continue with Prozac Wellbutrin and Seroquel   Transaminases: Trending down Edema; no focal edema on [place].    Discharge Diagnoses:  Principal Problem:   Diarrhea Active Problems:   Hypothyroidism   Bipolar 1 disorder, depressed (HCC)   Chronic pain   Hypokalemia   Leukocytosis   Transaminitis   Generalized weakness    Discharge Instructions  Discharge Instructions     Diet - low sodium heart healthy   Complete by: As directed    Increase activity slowly   Complete by: As directed       Allergies as of 01/24/2021       Reactions   Prednisone Other (See Comments)   Cannot tolerate ORALLY, but can tolerate by shot (interacts with anxiety & bipolarity)   Lithium Carbonate Other (See Comments)   Patient was taken off of this because of a weight gain of 40 pounds   Other Other (See Comments)   Serious reaction to 1st COVID vaccination - unknown type   Sulfa Antibiotics Other (See Comments)   From childhood; reaction not recalled        Medication List     TAKE these medications    albuterol 108 (90 Base) MCG/ACT inhaler Commonly known as: VENTOLIN HFA Inhale 2 puffs into the lungs every 6 (six) hours as needed for wheezing or shortness of breath.   albuterol (2.5 MG/3ML) 0.083% nebulizer solution Commonly known as: PROVENTIL Take 2.5 mg by nebulization every 6 (six) hours as needed for wheezing or shortness of breath.   buPROPion 150 MG 24 hr tablet Commonly known as: WELLBUTRIN XL Take 150 mg by mouth 2 (two)  times daily.   EPINEPHrine 0.3 mg/0.3 mL Soaj injection Commonly known as: EPI-PEN Inject 0.3 mg into the skin daily as needed for anaphylaxis.   famotidine 20 MG tablet Commonly known as: PEPCID Take 1 tablet (20 mg total) by mouth at bedtime.   FLUoxetine 20 MG capsule Commonly known as: PROZAC Take 20 mg by mouth 2 (two) times daily.   gabapentin 600 MG tablet Commonly known as: NEURONTIN Take 600 mg by mouth 3  (three) times daily as needed (pain).   levothyroxine 125 MCG tablet Commonly known as: SYNTHROID Take 1 tablet (125 mcg total) by mouth daily before breakfast. What changed:  medication strength how much to take   loperamide 2 MG capsule Commonly known as: IMODIUM Take 2 capsules (4 mg total) by mouth as needed for diarrhea or loose stools.   multivitamin with minerals Tabs tablet Take 1 tablet by mouth daily.   naproxen sodium 220 MG tablet Commonly known as: ALEVE Take 440 mg by mouth every 12 (twelve) hours as needed (pain).   oxcarbazepine 600 MG tablet Commonly known as: TRILEPTAL Take 600 mg by mouth 2 (two) times daily.   Oxycodone HCl 10 MG Tabs Take 1 tablet (10 mg total) by mouth every 4 (four) hours as needed (for breakthrough pain). What changed:  when to take this reasons to take this   QUEtiapine 400 MG tablet Commonly known as: SEROQUEL Take 400 mg by mouth every evening.   rizatriptan 10 MG disintegrating tablet Commonly known as: MAXALT-MLT Take 10 mg by mouth daily as needed for migraine.   Xtampza ER 9 MG C12a Generic drug: oxyCODONE ER Take 9 mg by mouth 2 (two) times daily.        Allergies  Allergen Reactions   Prednisone Other (See Comments)    Cannot tolerate ORALLY, but can tolerate by shot (interacts with anxiety & bipolarity)   Lithium Carbonate Other (See Comments)    Patient was taken off of this because of a weight gain of 40 pounds   Other Other (See Comments)    Serious reaction to 1st COVID vaccination - unknown type   Sulfa Antibiotics Other (See Comments)    From childhood; reaction not recalled    Consultations: None   Procedures/Studies: CT ABDOMEN PELVIS WO CONTRAST  Result Date: 01/13/2021 CLINICAL DATA:  Sepsis EXAM: CT ABDOMEN AND PELVIS WITHOUT CONTRAST TECHNIQUE: Multidetector CT imaging of the abdomen and pelvis was performed following the standard protocol without IV contrast. COMPARISON:  Virtual  colonoscopy dated 11/17/2019 FINDINGS: Lower chest: Although not ordered, almost the entire chest is within the field of view. No evidence of pneumonia. Mild compressive atelectasis in the medial right middle lobe. No pleural effusion or pneumothorax. Large fat containing anterior hernia along the right anterior aspect of the heart. The heart is normal in size. No pericardial effusion. Mild atherosclerotic calcifications of the arch. Hepatobiliary: Unenhanced liver is unremarkable. Gallbladder is unremarkable. No intrahepatic or extrahepatic ductal dilatation. Pancreas: Within normal limits. Spleen: Within normal limits. Adrenals/Urinary Tract: Adrenal glands are within normal limits. Kidneys are within normal limits. No renal calculi or hydronephrosis. Bladder decompressed by an indwelling Foley catheter with trace gas. Stomach/Bowel: Stomach is notable for a moderate hiatal hernia/intrathoracic stomach. No evidence of bowel obstruction. Normal appendix (series 8/image 4). Mild sigmoid diverticulosis, without evidence of diverticulitis. Vascular/Lymphatic: No evidence of abdominal aortic aneurysm. Atherosclerotic calcifications of the abdominal aorta and branch vessels. No suspicious abdominopelvic lymphadenopathy. Reproductive: Uterus is within normal limits. Bilateral ovaries  are unremarkable. Other: No abdominopelvic ascites. Musculoskeletal: Severe compression fracture deformity at T3 with mild retropulsion, age indeterminate. Mild superior endplate compression fracture deformity at T8, likely chronic. Prior vertebral augmentation at T12 and L2. Status post PLIF at L3-5. Degenerative changes of the lumbar spine. Left hip arthroplasty. IMPRESSION: No evidence of bowel obstruction. Normal appendix. Mild sigmoid diverticulosis, without evidence of diverticulitis. Bladder decompressed by an indwelling Foley catheter. No evidence of pneumonia. Moderate hiatal hernia.  Large anterior hernia with fat. Severe  compression fracture deformity at T3 with mild retropulsion, age indeterminate. Electronically Signed   By: Julian Hy M.D.   On: 01/13/2021 20:07   CT Head Wo Contrast  Result Date: 01/13/2021 CLINICAL DATA:  Head trauma EXAM: CT HEAD WITHOUT CONTRAST TECHNIQUE: Contiguous axial images were obtained from the base of the skull through the vertex without intravenous contrast. COMPARISON:  CT brain 01/04/2021, 03/02/2020, 02/23/2020 FINDINGS: Brain: No acute territorial infarction, hemorrhage or intracranial mass is visualized. Atrophy and mild chronic small vessel ischemic changes of the white matter. Small chronic infarcts in the cerebellum. Stable left posterior parafalcine round calcification likely calcified meningioma. Stable ventricle size. Vascular: No hyperdense vessels.  Carotid vascular calcification Skull: No fracture. Stable 11 mm intra osseous ground-glass lesion within the left frontal bone, felt benign. Sinuses/Orbits: No acute finding. Other: None IMPRESSION: 1. No CT evidence for acute intracranial abnormality. 2. Atrophy and mild chronic small vessel ischemic changes of the white matter Electronically Signed   By: Donavan Foil M.D.   On: 01/13/2021 16:12   CT HEAD WO CONTRAST (5MM)  Result Date: 01/04/2021 CLINICAL DATA:  [place] 2 days ago with trauma to the head and face. EXAM: CT HEAD WITHOUT CONTRAST TECHNIQUE: Contiguous axial images were obtained from the base of the skull through the vertex without intravenous contrast. COMPARISON:  03/09/2020 FINDINGS: Brain: Mild age related volume loss. No evidence of old or acute focal infarction, mass lesion, hemorrhage, hydrocephalus or extra-axial collection. Vascular: There is atherosclerotic calcification of the major vessels at the base of the brain. Skull: Negative Sinuses/Orbits: Clear/normal Other: None IMPRESSION: No acute or traumatic finding.  Mild age related volume loss. Electronically Signed   By: Nelson Chimes M.D.   On:  01/04/2021 14:44   DG Chest Port 1 View  Result Date: 01/13/2021 CLINICAL DATA:  Sepsis EXAM: PORTABLE CHEST 1 VIEW COMPARISON:  03/09/2020 FINDINGS: Single frontal view of the chest demonstrates a stable cardiac silhouette. Stable hiatal hernia and prominent epicardial fat. No acute airspace disease, effusion, or pneumothorax. No acute bony abnormalities. IMPRESSION: 1. Stable chest, no acute intrathoracic process. Electronically Signed   By: Randa Ngo M.D.   On: 01/13/2021 16:00   DG ABD ACUTE 2+V W 1V CHEST  Result Date: 01/19/2021 CLINICAL DATA:  Abdominal pain EXAM: DG ABDOMEN ACUTE WITH 1 VIEW CHEST COMPARISON:  CT abdomen/pelvis dated 01/05/2021 FINDINGS: Large hernia at the right lung base. Bilateral lower lobe atelectasis. Lungs are otherwise clear.  No pleural effusion or pneumothorax. The heart is normal in size. Nonobstructive bowel gas pattern. No evidence of free air under the diaphragm on the upright view. Lumbar spine fixation hardware with prior vertebral augmentation. IMPRESSION: Negative abdominal radiographs.  No acute cardiopulmonary disease. Electronically Signed   By: Julian Hy M.D.   On: 01/19/2021 01:32   [place] LT UPPER EXTREM LTD SOFT TISSUE NON VASCULAR  Result Date: 01/18/2021 CLINICAL DATA:  Left arm swelling EXAM: ULTRASOUND LEFT UPPER EXTREMITY LIMITED TECHNIQUE: Ultrasound examination of the upper  extremity soft tissues was performed in the area of clinical concern. COMPARISON:  None. FINDINGS: Dedicated grayscale and color Doppler sonography was performed within the left antecubital fossa in the area of focal swelling. No abnormal subcutaneous soft tissue mass, fluid collection, or calcification is seen within this region. IMPRESSION: No sonographic correlate for the patient's reported focal swelling Electronically Signed   By: Fidela Salisbury M.D.   On: 01/18/2021 19:39   CT Maxillofacial Wo Contrast  Result Date: 01/04/2021 CLINICAL DATA:  [place] 2 days ago  with facial injury and bruising. EXAM: CT MAXILLOFACIAL WITHOUT CONTRAST TECHNIQUE: Multidetector CT imaging of the maxillofacial structures was performed. Multiplanar CT image reconstructions were also generated. COMPARISON:  None. FINDINGS: Osseous: No facial fracture or focal bone lesion. Orbits: No evidence of orbital injury. Sinuses: Sinuses are clear. No traumatic fluid or inflammatory changes. Soft tissues: No soft tissue finding of note by CT. Limited intracranial: Normal IMPRESSION: Negative CT scan of the face. No evidence of fracture or orbital injury. Electronically Signed   By: Nelson Chimes M.D.   On: 01/04/2021 14:43   US Abdomen Limited RUQ (LIVER/GB)  Result Date: 01/13/2021 CLINICAL DATA:  Right upper quadrant abdominal pain. EXAM: ULTRASOUND ABDOMEN LIMITED RIGHT UPPER QUADRANT COMPARISON:  Abdominal ultrasound and CT 08/31/2019 FINDINGS: Gallbladder: Physiologically distended. Layering intraluminal sludge. Gallstones on prior ultrasound are not well seen on the current exam. No gallbladder wall thickening. No sonographic Murphy sign noted by sonographer. Common bile duct: Diameter: 6 mm. Liver: Heterogeneous increased parenchymal echogenicity. The liver is difficult to penetrate. Allowing for this, no focal lesion. Technically limited assessment due to altered mental status and difficulty with breath hold. Portal vein is patent on color Doppler imaging with normal direction of blood flow towards the liver. Other: No right upper quadrant ascites. IMPRESSION: 1. Gallbladder sludge. Gallstones on prior ultrasound are not well seen on the current exam. No sonographic findings of acute cholecystitis. 2. No biliary dilatation. 3. Suggestion of hepatic steatosis. Electronically Signed   By: Keith Rake M.D.   On: 01/13/2021 18:10    Subjective: She is doing well, denies pain  Discharge Exam: Vitals:   01/24/21 0459 01/24/21 0837  BP: (!) 150/85 132/79  Pulse: 76 74  Resp: 19 17  Temp:  99.2 F (37.3 C) 98.3 F (36.8 C)  SpO2: 98% 94%     General: Pt is alert, awake, not in acute distress Cardiovascular: RRR, S1/S2 +, no rubs, no gallops Respiratory: CTA bilaterally, no wheezing, no rhonchi Abdominal: Soft, NT, ND, bowel sounds + Extremities: no edema, no cyanosis    The results of significant diagnostics from this hospitalization (including imaging, microbiology, ancillary and laboratory) are listed below for reference.     Microbiology: Recent Results (from the past 240 hour(s))  Resp Panel by RT-PCR (Flu A&B, Covid) Nasopharyngeal Swab     Status: None   Collection Time: 01/18/21  6:33 AM   Specimen: Nasopharyngeal Swab; Nasopharyngeal(NP) swabs in vial transport medium  Result Value Ref Range Status   SARS Coronavirus 2 by RT PCR NEGATIVE NEGATIVE Final    Comment: (NOTE) SARS-CoV-2 target nucleic acids are NOT DETECTED.  The SARS-CoV-2 RNA is generally detectable in upper respiratory specimens during the acute phase of infection. The lowest concentration of SARS-CoV-2 viral copies this assay can detect is 138 copies/mL. A negative result does not preclude SARS-Cov-2 infection and should not be used as the sole basis for treatment or other patient management decisions. A negative result may occur  with  improper specimen collection/handling, submission of specimen other than nasopharyngeal swab, presence of viral mutation(s) within the areas targeted by this assay, and inadequate number of viral copies(<138 copies/mL). A negative result must be combined with clinical observations, patient history, and epidemiological information. The expected result is Negative.  Fact Sheet for Patients:  EntrepreneurPulse.com.au  Fact Sheet for Healthcare Providers:  IncredibleEmployment.be  This test is no t yet approved or cleared by the [place] FDA and  has been authorized for detection and/or diagnosis of SARS-CoV-2  by FDA under an Emergency Use Authorization (EUA). This EUA will remain  in effect (meaning this test can be used) for the duration of the COVID-19 declaration under Section 564(b)(1) of the Act, 21 U.S.C.section 360bbb-3(b)(1), unless the authorization is terminated  or revoked sooner.       Influenza A by PCR NEGATIVE NEGATIVE Final   Influenza B by PCR NEGATIVE NEGATIVE Final    Comment: (NOTE) The Xpert Xpress SARS-CoV-2/FLU/RSV plus assay is intended as an aid in the diagnosis of influenza from Nasopharyngeal swab specimens and should not be used as a sole basis for treatment. Nasal washings and aspirates are unacceptable for Xpert Xpress SARS-CoV-2/FLU/RSV testing.  Fact Sheet for Patients: EntrepreneurPulse.com.au  Fact Sheet for Healthcare Providers: IncredibleEmployment.be  This test is not yet approved or cleared by the [place] FDA and has been authorized for detection and/or diagnosis of SARS-CoV-2 by FDA under an Emergency Use Authorization (EUA). This EUA will remain in effect (meaning this test can be used) for the duration of the COVID-19 declaration under Section 564(b)(1) of the Act, 21 U.S.C. section 360bbb-3(b)(1), unless the authorization is terminated or revoked.  Performed at [facility], [place] [address], [place], [place] [address]   Gastrointestinal Panel by PCR , Stool     Status: None   Collection Time: 01/18/21  1:09 PM   Specimen: Stool  Result Value Ref Range Status   Campylobacter species NOT DETECTED NOT DETECTED Final   Plesimonas shigelloides NOT DETECTED NOT DETECTED Final   Salmonella species NOT DETECTED NOT DETECTED Final   Yersinia enterocolitica NOT DETECTED NOT DETECTED Final   Vibrio species NOT DETECTED NOT DETECTED Final   Vibrio cholerae NOT DETECTED NOT DETECTED Final   Enteroaggregative E coli (EAEC) NOT DETECTED NOT DETECTED Final   Enteropathogenic E coli (EPEC) NOT DETECTED NOT  DETECTED Final   Enterotoxigenic E coli (ETEC) NOT DETECTED NOT DETECTED Final   Shiga like toxin producing E coli (STEC) NOT DETECTED NOT DETECTED Final   Shigella/Enteroinvasive E coli (EIEC) NOT DETECTED NOT DETECTED Final   Cryptosporidium NOT DETECTED NOT DETECTED Final   Cyclospora cayetanensis NOT DETECTED NOT DETECTED Final   Entamoeba histolytica NOT DETECTED NOT DETECTED Final   Giardia lamblia NOT DETECTED NOT DETECTED Final   Adenovirus F40/41 NOT DETECTED NOT DETECTED Final   Astrovirus NOT DETECTED NOT DETECTED Final   Norovirus GI/GII NOT DETECTED NOT DETECTED Final   Rotavirus A NOT DETECTED NOT DETECTED Final   Sapovirus (I, II, IV, and V) NOT DETECTED NOT DETECTED Final    Comment: Performed at [facility], [place] [address] [place], [place] [address]  C Difficile Quick Screen w PCR reflex     Status: None   Collection Time: 01/18/21  1:09 PM   Specimen: Stool  Result Value Ref Range Status   C Diff antigen NEGATIVE NEGATIVE Final   C Diff toxin NEGATIVE NEGATIVE Final   C Diff interpretation No C.  difficile detected.  Final    Comment: Performed at [facility], [place] [address] [place], [place] [address]  Resp Panel by RT-PCR (Flu A&B, Covid) Nasopharyngeal Swab     Status: None   Collection Time: 01/20/21  2:32 PM   Specimen: Nasopharyngeal Swab; Nasopharyngeal(NP) swabs in vial transport medium  Result Value Ref Range Status   SARS Coronavirus 2 by RT PCR NEGATIVE NEGATIVE Final    Comment: (NOTE) SARS-CoV-2 target nucleic acids are NOT DETECTED.  The SARS-CoV-2 RNA is generally detectable in upper respiratory specimens during the acute phase of infection. The lowest concentration of SARS-CoV-2 viral copies this assay can detect is 138 copies/mL. A negative result does not preclude SARS-Cov-2 infection and should not be used as the sole basis for treatment or other patient management decisions. A negative result may occur with  improper specimen  collection/handling, submission of specimen other than nasopharyngeal swab, presence of viral mutation(s) within the areas targeted by this assay, and inadequate number of viral copies(<138 copies/mL). A negative result must be combined with clinical observations, patient history, and epidemiological information. The expected result is Negative.  Fact Sheet for Patients:  EntrepreneurPulse.com.au  Fact Sheet for Healthcare Providers:  IncredibleEmployment.be  This test is no t yet approved or cleared by the [place] FDA and  has been authorized for detection and/or diagnosis of SARS-CoV-2 by FDA under an Emergency Use Authorization (EUA). This EUA will remain  in effect (meaning this test can be used) for the duration of the COVID-19 declaration under Section 564(b)(1) of the Act, 21 U.S.C.section 360bbb-3(b)(1), unless the authorization is terminated  or revoked sooner.       Influenza A by PCR NEGATIVE NEGATIVE Final   Influenza B by PCR NEGATIVE NEGATIVE Final    Comment: (NOTE) The Xpert Xpress SARS-CoV-2/FLU/RSV plus assay is intended as an aid in the diagnosis of influenza from Nasopharyngeal swab specimens and should not be used as a sole basis for treatment. Nasal washings and aspirates are unacceptable for Xpert Xpress SARS-CoV-2/FLU/RSV testing.  Fact Sheet for Patients: EntrepreneurPulse.com.au  Fact Sheet for Healthcare Providers: IncredibleEmployment.be  This test is not yet approved or cleared by the [place] FDA and has been authorized for detection and/or diagnosis of SARS-CoV-2 by FDA under an Emergency Use Authorization (EUA). This EUA will remain in effect (meaning this test can be used) for the duration of the COVID-19 declaration under Section 564(b)(1) of the Act, 21 U.S.C. section 360bbb-3(b)(1), unless the authorization is terminated or revoked.  Performed at [facility], [place] [address] [place], [place] [address]      Labs: BNP (last 3 results) No results for input(s): BNP in the last 8760 hours. Basic Metabolic Panel: Recent Labs  Lab 01/18/21 1313 01/19/21 0037 01/20/21 0645 01/20/21 1235 01/21/21 0840 01/22/21 0928 01/24/21 0156  NA  --  134* 136 134* 139 135 133*  K  --  3.2* 2.9* 3.5 3.6 3.5 3.9  CL  --  105 104 102 105 102 101  CO2  --  '24 28 26 25 28 26  ' GLUCOSE  --  78 83 146* 94 103* 83  BUN  --  '14 10 10 11 12 12  ' CREATININE  --  0.88 0.81 0.81 0.83 0.82 0.83  CALCIUM  --  8.2* 8.5* 8.5* 8.7* 8.6* 8.8*  MG 1.9 1.5* 1.8  --   --   --   --    Liver Function Tests: Recent Labs  Lab 01/17/21 2353  AST 33  ALT 76*  ALKPHOS 82  BILITOT 0.8  PROT 5.3*  ALBUMIN 2.6*   No results for input(s): LIPASE, AMYLASE in the last 168 hours. No results for input(s): AMMONIA in the last 168 hours. CBC: Recent Labs  Lab 01/17/21 2353 01/19/21 0037  WBC 14.2* 12.6*  NEUTROABS 11.5*  --   HGB 14.2 12.2  HCT 42.6 36.2  MCV 94.0 91.9  PLT 253 269   Cardiac Enzymes: No results for input(s): CKTOTAL, CKMB, CKMBINDEX, TROPONINI in the last 168 hours.  BNP: Invalid input(s): POCBNP CBG: No results for input(s): GLUCAP in the last 168 hours.  D-Dimer No results for input(s): DDIMER in the last 72 hours. Hgb A1c No results for input(s): HGBA1C in the last 72 hours. Lipid Profile No results for input(s): CHOL, HDL, LDLCALC, TRIG, CHOLHDL, LDLDIRECT in the last 72 hours. Thyroid function studies No results for input(s): TSH, T4TOTAL, T3FREE, THYROIDAB in the last 72 hours.  Invalid input(s): FREET3  Anemia work up No results for input(s): VITAMINB12, FOLATE, FERRITIN, TIBC, IRON, RETICCTPCT in the last 72 hours. Urinalysis    Component Value Date/Time   COLORURINE AMBER (A) 01/13/2021 1729   APPEARANCEUR HAZY (A) 01/13/2021 1729   LABSPEC 1.015 01/13/2021 1729   PHURINE 5.0 01/13/2021 1729   GLUCOSEU NEGATIVE  01/13/2021 1729   HGBUR NEGATIVE 01/13/2021 1729   BILIRUBINUR NEGATIVE 01/13/2021 1729   KETONESUR NEGATIVE 01/13/2021 1729   PROTEINUR NEGATIVE 01/13/2021 1729   UROBILINOGEN 0.2 11/12/2013 2233   NITRITE NEGATIVE 01/13/2021 1729   LEUKOCYTESUR NEGATIVE 01/13/2021 1729   Sepsis Labs Invalid input(s): PROCALCITONIN,  WBC,  LACTICIDVEN Microbiology Recent Results (from the past 240 hour(s))  Resp Panel by RT-PCR (Flu A&B, Covid) Nasopharyngeal Swab     Status: None   Collection Time: 01/18/21  6:33 AM   Specimen: Nasopharyngeal Swab; Nasopharyngeal(NP) swabs in vial transport medium  Result Value Ref Range Status   SARS Coronavirus 2 by RT PCR NEGATIVE NEGATIVE Final    Comment: (NOTE) SARS-CoV-2 target nucleic acids are NOT DETECTED.  The SARS-CoV-2 RNA is generally detectable in upper respiratory specimens during the acute phase of infection. The lowest concentration of SARS-CoV-2 viral copies this assay can detect is 138 copies/mL. A negative result does not preclude SARS-Cov-2 infection and should not be used as the sole basis for treatment or other patient management decisions. A negative result may occur with  improper specimen collection/handling, submission of specimen other than nasopharyngeal swab, presence of viral mutation(s) within the areas targeted by this assay, and inadequate number of viral copies(<138 copies/mL). A negative result must be combined with clinical observations, patient history, and epidemiological information. The expected result is Negative.  Fact Sheet for Patients:  EntrepreneurPulse.com.au  Fact Sheet for Healthcare Providers:  IncredibleEmployment.be  This test is no t yet approved or cleared by the [place] FDA and  has been authorized for detection and/or diagnosis of SARS-CoV-2 by FDA under an Emergency Use Authorization (EUA). This EUA will remain  in effect (meaning this test can be used) for  the duration of the COVID-19 declaration under Section 564(b)(1) of the Act, 21 U.S.C.section 360bbb-3(b)(1), unless the authorization is terminated  or revoked sooner.       Influenza A by PCR NEGATIVE NEGATIVE Final   Influenza B by PCR NEGATIVE NEGATIVE Final    Comment: (NOTE) The Xpert Xpress SARS-CoV-2/FLU/RSV plus assay is intended as an aid in the diagnosis of influenza from Nasopharyngeal swab  specimens and should not be used as a sole basis for treatment. Nasal washings and aspirates are unacceptable for Xpert Xpress SARS-CoV-2/FLU/RSV testing.  Fact Sheet for Patients: EntrepreneurPulse.com.au  Fact Sheet for Healthcare Providers: IncredibleEmployment.be  This test is not yet approved or cleared by the [place] FDA and has been authorized for detection and/or diagnosis of SARS-CoV-2 by FDA under an Emergency Use Authorization (EUA). This EUA will remain in effect (meaning this test can be used) for the duration of the COVID-19 declaration under Section 564(b)(1) of the Act, 21 U.S.C. section 360bbb-3(b)(1), unless the authorization is terminated or revoked.  Performed at [facility], [place] [address], [place], [place] [address]   Gastrointestinal Panel by PCR , Stool     Status: None   Collection Time: 01/18/21  1:09 PM   Specimen: Stool  Result Value Ref Range Status   Campylobacter species NOT DETECTED NOT DETECTED Final   Plesimonas shigelloides NOT DETECTED NOT DETECTED Final   Salmonella species NOT DETECTED NOT DETECTED Final   Yersinia enterocolitica NOT DETECTED NOT DETECTED Final   Vibrio species NOT DETECTED NOT DETECTED Final   Vibrio cholerae NOT DETECTED NOT DETECTED Final   Enteroaggregative E coli (EAEC) NOT DETECTED NOT DETECTED Final   Enteropathogenic E coli (EPEC) NOT DETECTED NOT DETECTED Final   Enterotoxigenic E coli (ETEC) NOT DETECTED NOT DETECTED Final   Shiga like toxin producing E coli  (STEC) NOT DETECTED NOT DETECTED Final   Shigella/Enteroinvasive E coli (EIEC) NOT DETECTED NOT DETECTED Final   Cryptosporidium NOT DETECTED NOT DETECTED Final   Cyclospora cayetanensis NOT DETECTED NOT DETECTED Final   Entamoeba histolytica NOT DETECTED NOT DETECTED Final   Giardia lamblia NOT DETECTED NOT DETECTED Final   Adenovirus F40/41 NOT DETECTED NOT DETECTED Final   Astrovirus NOT DETECTED NOT DETECTED Final   Norovirus GI/GII NOT DETECTED NOT DETECTED Final   Rotavirus A NOT DETECTED NOT DETECTED Final   Sapovirus (I, II, IV, and V) NOT DETECTED NOT DETECTED Final    Comment: Performed at [facility], [address] [place], [place] [address]  C Difficile Quick Screen w PCR reflex     Status: None   Collection Time: 01/18/21  1:09 PM   Specimen: Stool  Result Value Ref Range Status   C Diff antigen NEGATIVE NEGATIVE Final   C Diff toxin NEGATIVE NEGATIVE Final   C Diff interpretation No C. difficile detected.  Final    Comment: Performed at [facility], [place] [address] [place], [place] [address]  Resp Panel by RT-PCR (Flu A&B, Covid) Nasopharyngeal Swab     Status: None   Collection Time: 01/20/21  2:32 PM   Specimen: Nasopharyngeal Swab; Nasopharyngeal(NP) swabs in vial transport medium  Result Value Ref Range Status   SARS Coronavirus 2 by RT PCR NEGATIVE NEGATIVE Final    Comment: (NOTE) SARS-CoV-2 target nucleic acids are NOT DETECTED.  The SARS-CoV-2 RNA is generally detectable in upper respiratory specimens during the acute phase of infection. The lowest concentration of SARS-CoV-2 viral copies this assay can detect is 138 copies/mL. A negative result does not preclude SARS-Cov-2 infection and should not be used as the sole basis for treatment or other patient management decisions. A negative result may occur with  improper specimen collection/handling, submission of specimen other than nasopharyngeal swab, presence of viral mutation(s) within  the areas targeted by this assay, and inadequate number of viral copies(<138 copies/mL). A negative result must be combined with  clinical observations, patient history, and epidemiological information. The expected result is Negative.  Fact Sheet for Patients:  EntrepreneurPulse.com.au  Fact Sheet for Healthcare Providers:  IncredibleEmployment.be  This test is no t yet approved or cleared by the [place] FDA and  has been authorized for detection and/or diagnosis of SARS-CoV-2 by FDA under an Emergency Use Authorization (EUA). This EUA will remain  in effect (meaning this test can be used) for the duration of the COVID-19 declaration under Section 564(b)(1) of the Act, 21 U.S.C.section 360bbb-3(b)(1), unless the authorization is terminated  or revoked sooner.       Influenza A by PCR NEGATIVE NEGATIVE Final   Influenza B by PCR NEGATIVE NEGATIVE Final    Comment: (NOTE) The Xpert Xpress SARS-CoV-2/FLU/RSV plus assay is intended as an aid in the diagnosis of influenza from Nasopharyngeal swab specimens and should not be used as a sole basis for treatment. Nasal washings and aspirates are unacceptable for Xpert Xpress SARS-CoV-2/FLU/RSV testing.  Fact Sheet for Patients: EntrepreneurPulse.com.au  Fact Sheet for Healthcare Providers: IncredibleEmployment.be  This test is not yet approved or cleared by the [place] FDA and has been authorized for detection and/or diagnosis of SARS-CoV-2 by FDA under an Emergency Use Authorization (EUA). This EUA will remain in effect (meaning this test can be used) for the duration of the COVID-19 declaration under Section 564(b)(1) of the Act, 21 U.S.C. section 360bbb-3(b)(1), unless the authorization is terminated or revoked.  Performed at [facility], [place] [address] [place], [place] [address]      Time coordinating discharge: 40  minutes  SIGNED:   Elmarie Shiley, MD  [facility]

## 2021-01-25 DIAGNOSIS — D72825 Bandemia: Secondary | ICD-10-CM

## 2021-01-25 DIAGNOSIS — R197 Diarrhea, unspecified: Secondary | ICD-10-CM | POA: Diagnosis not present

## 2021-01-25 DIAGNOSIS — R7989 Other specified abnormal findings of blood chemistry: Secondary | ICD-10-CM

## 2021-01-25 DIAGNOSIS — R112 Nausea with vomiting, unspecified: Secondary | ICD-10-CM

## 2021-01-25 NOTE — Progress Notes (Signed)
Mobility Specialist Progress Note:   01/25/21 1000  Mobility  Activity Ambulated in room;Transferred:  Bed to chair  Level of Assistance Contact guard assist, steadying assist  Assistive Device Front wheel walker  Distance Ambulated (ft) 5 ft  Mobility Ambulated with assistance in room;Out of bed to chair with meals  Mobility Response Tolerated fair  Mobility performed by Mobility specialist  Bed Position Chair  $Mobility charge 1 Mobility   Pt c/o dizziness upon sitting EOB and standing. Transferred from bed to chair with contact G for safety.   Nelta Numbers Mobility Specialist  Phone [phone]

## 2021-01-25 NOTE — TOC Progression Note (Signed)
Transition of Care [facility]) - Progression Note    Patient Details  Name: Victoria Levine MRN: [id number] Date of Birth: 17-Oct-1950  [facility] CM/SW Contact  Emeterio Reeve, [place] Phone Number: 01/25/2021, 10:39 AM  Clinical Narrative:      Pts Passr is still pending. CSW will continue to check database.       Expected Discharge Plan and Services           Expected Discharge Date: 01/20/21                                     Social Determinants of Health (SDOH) Interventions    Readmission Risk Interventions Readmission Risk Prevention Plan 02/25/2020  Transportation Screening Complete  Medication Review [employer]) Complete  PCP or Specialist appointment within 3-5 days of discharge Complete  SW Recovery Care/Counseling Consult Complete  Skilled [place] Complete  Some recent data might be hidden   Emeterio Reeve, LCSW Clinical Social Worker

## 2021-01-25 NOTE — Progress Notes (Signed)
Patient admitted with nausea, vomiting, diarrhea and generalized weakness in the setting of gastroenteritis.  Symptoms resolved.  She was discharged to SNF on 01/24/2021 but remained in-house pending insurance authorization.  No major events overnight or this morning.  Discharge summary from 01/24/2021 stands.

## 2021-01-25 NOTE — Progress Notes (Signed)
PROGRESS NOTE  Victoria Levine DOB: 05/16/1950   PCP: [name], MD  Patient is from: Home.  Lives alone.  DOA: 01/17/2021 LOS: 0  Chief complaints:  Chief Complaint  Patient presents with   Diarrhea   Weakness     Brief Narrative / Interim history: 70 year old F with PMH of asthma, hypothyroidism, GERD, osteoporosis, anxiety, BPD, alcohol use and recent hospitalization from 10/28-11/1 for weakness, confusion, AKI, SIRS and electrolyte treatment returning with nausea, vomiting, diarrhea and generalized weakness, and and admitted with working diagnosis of gastroenteritis, dehydration and electrolyte derangement.  Initially had SIRS and started on antibiotics that were discontinued as there was no clear source of bacterial infection.  C. difficile and GI panel negative.  She was treated with IV fluids.   GI symptoms and electrolyte derangements resolved. Therapy recommended SNF. Note that therapy recommended SNF prior hospitalization when she declined and decided to go home with home health. Now she agreeable to SNF.   Subjective: Seen and examined earlier this morning.  No major events overnight of this morning.  She is nervous about going to rehab stating that they did not help her much when she went to rehab in the past.  Otherwise no complaints.  She denies chest pain, dyspnea, GI or UTI symptoms.  Objective: Vitals:   01/24/21 1641 01/24/21 1952 01/25/21 0441 01/25/21 0736  BP: (!) 132/91 (!) 157/93 121/85 (!) 122/98  Pulse: 93 86 73 92  Resp: 14 18 19 18   Temp: 98.5 F (36.9 C) 98.9 F (37.2 C) 97.6 F (36.4 C) 98.3 F (36.8 C)  TempSrc: Oral Oral Oral   SpO2: 96% 96% 98% 98%  Weight:      Height:        Intake/Output Summary (Last 24 hours) at 01/25/2021 1510 Last data filed at 01/25/2021 1123 Gross per 24 hour  Intake 480 ml  Output 1000 ml  Net -520 ml   Filed Weights   01/19/21 0856  Weight: 68.9 kg    Examination:  GENERAL: No  apparent distress.  Nontoxic. HEENT: MMM.  Vision and hearing grossly intact.  NECK: Supple.  No apparent JVD.  RESP:  No IWOB.  Fair aeration bilaterally. CVS:  RRR. Heart sounds normal.  ABD/GI/GU: BS+. Abd soft, NTND.  MSK/EXT:  Moves extremities. No apparent deformity. No edema.  SKIN: no apparent skin lesion or wound NEURO: Awake, alert and oriented appropriately.  No apparent focal neuro deficit. PSYCH: Calm. Normal affect.   Procedures:  None  Microbiology summarized: JOINO-67 and influenza PCR nonreactive. C. difficile PCR negative. GI panel negative.  Assessment & Plan: Acute nausea/vomiting/diarrhea/dehydration: Seems to have resolved.  SIRS: Likely from dehydration.  Sepsis ruled out.  Hypokalemia/hypomagnesemia: Resolved.   Leukocytosis/bandemia: trending down. -Recheck CBC at follow-up  Hypothyroidism/elevated TSH: Free T4 0.81.  TSH was 15.7 with free T4> 5.5 previous admission.  Doubt accuracy in acute setting.  She might be not compliant with Synthroid. -Continue home Synthroid -Recheck TSH in 4 to 6 weeks.   Generalized weakness/physical deconditioning: Previously refused SNF and went home just to return 3 days later.  She is now agreeable to SNF.  Waiting on insurance authorization  Chronic pain, chronic opioid use: -As needed pain meds -Avoid opiates.   Anxiety/depression/BPD: Stable.   -Continue with Prozac Wellbutrin and Seroquel   Transaminases: Improved. -Recheck CMP at follow-up   Body mass index is 25.28 kg/m.        DVT prophylaxis:  enoxaparin (LOVENOX) injection 40 mg  Start: 01/18/21 1000  Code Status: DNR/DNI Family Communication: Patient and/or RN. Available if any question.  Level of care: Telemetry Medical Status is: Observation  The patient remains OBS appropriate and will d/c before 2 midnights.      Consultants:  None   Sch Meds:  Scheduled Meds:  buPROPion  150 mg Oral BID   enoxaparin (LOVENOX) injection  40  mg Subcutaneous Q24H   famotidine  20 mg Oral QHS   FLUoxetine  20 mg Oral BID   levothyroxine  125 mcg Oral QAC breakfast   oxcarbazepine  600 mg Oral BID   oxyCODONE  10 mg Oral Q12H   QUEtiapine  400 mg Oral QPM   Continuous Infusions: PRN Meds:.acetaminophen **OR** acetaminophen, albuterol, gabapentin, hydrALAZINE, loperamide, ondansetron **OR** ondansetron (ZOFRAN) IV, oxyCODONE, SUMAtriptan  Antimicrobials: Anti-infectives (From admission, onward)    None        I have personally reviewed the following labs and images: CBC: Recent Labs  Lab 01/19/21 0037  WBC 12.6*  HGB 12.2  HCT 36.2  MCV 91.9  PLT 269   BMP &GFR Recent Labs  Lab 01/19/21 0037 01/20/21 0645 01/20/21 1235 01/21/21 0840 01/22/21 0928 01/24/21 0156  NA 134* 136 134* 139 135 133*  K 3.2* 2.9* 3.5 3.6 3.5 3.9  CL 105 104 102 105 102 101  CO2 24 28 26 25 28 26   GLUCOSE 78 83 146* 94 103* 83  BUN 14 10 10 11 12 12   CREATININE 0.88 0.81 0.81 0.83 0.82 0.83  CALCIUM 8.2* 8.5* 8.5* 8.7* 8.6* 8.8*  MG 1.5* 1.8  --   --   --   --    Estimated Creatinine Clearance: 60.7 mL/min (by C-G formula based on SCr of 0.83 mg/dL). Liver & Pancreas: No results for input(s): AST, ALT, ALKPHOS, BILITOT, PROT, ALBUMIN in the last 168 hours. No results for input(s): LIPASE, AMYLASE in the last 168 hours. No results for input(s): AMMONIA in the last 168 hours. Diabetic: No results for input(s): HGBA1C in the last 72 hours. No results for input(s): GLUCAP in the last 168 hours. Cardiac Enzymes: No results for input(s): CKTOTAL, CKMB, CKMBINDEX, TROPONINI in the last 168 hours. No results for input(s): PROBNP in the last 8760 hours. Coagulation Profile: No results for input(s): INR, PROTIME in the last 168 hours. Thyroid Function Tests: No results for input(s): TSH, T4TOTAL, FREET4, T3FREE, THYROIDAB in the last 72 hours. Lipid Profile: No results for input(s): CHOL, HDL, LDLCALC, TRIG, CHOLHDL, LDLDIRECT in  the last 72 hours. Anemia Panel: No results for input(s): VITAMINB12, FOLATE, FERRITIN, TIBC, IRON, RETICCTPCT in the last 72 hours. Urine analysis:    Component Value Date/Time   COLORURINE AMBER (A) 01/13/2021 1729   APPEARANCEUR HAZY (A) 01/13/2021 1729   LABSPEC 1.015 01/13/2021 1729   PHURINE 5.0 01/13/2021 1729   GLUCOSEU NEGATIVE 01/13/2021 1729   HGBUR NEGATIVE 01/13/2021 1729   BILIRUBINUR NEGATIVE 01/13/2021 1729   KETONESUR NEGATIVE 01/13/2021 1729   PROTEINUR NEGATIVE 01/13/2021 1729   UROBILINOGEN 0.2 11/12/2013 2233   NITRITE NEGATIVE 01/13/2021 1729   LEUKOCYTESUR NEGATIVE 01/13/2021 1729   Sepsis Labs: Invalid input(s): PROCALCITONIN, [place]  Microbiology: Recent Results (from the past 240 hour(s))  Resp Panel by RT-PCR (Flu A&B, Covid) Nasopharyngeal Swab     Status: None   Collection Time: 01/18/21  6:33 AM   Specimen: Nasopharyngeal Swab; Nasopharyngeal(NP) swabs in vial transport medium  Result Value Ref Range Status   SARS Coronavirus 2 by RT PCR  NEGATIVE NEGATIVE Final    Comment: (NOTE) SARS-CoV-2 target nucleic acids are NOT DETECTED.  The SARS-CoV-2 RNA is generally detectable in upper respiratory specimens during the acute phase of infection. The lowest concentration of SARS-CoV-2 viral copies this assay can detect is 138 copies/mL. A negative result does not preclude SARS-Cov-2 infection and should not be used as the sole basis for treatment or other patient management decisions. A negative result may occur with  improper specimen collection/handling, submission of specimen other than nasopharyngeal swab, presence of viral mutation(s) within the areas targeted by this assay, and inadequate number of viral copies(<138 copies/mL). A negative result must be combined with clinical observations, patient history, and epidemiological information. The expected result is Negative.  Fact Sheet for Patients:   EntrepreneurPulse.com.au  Fact Sheet for Healthcare Providers:  IncredibleEmployment.be  This test is no t yet approved or cleared by the [place] FDA and  has been authorized for detection and/or diagnosis of SARS-CoV-2 by FDA under an Emergency Use Authorization (EUA). This EUA will remain  in effect (meaning this test can be used) for the duration of the COVID-19 declaration under Section 564(b)(1) of the Act, 21 U.S.C.section 360bbb-3(b)(1), unless the authorization is terminated  or revoked sooner.       Influenza A by PCR NEGATIVE NEGATIVE Final   Influenza B by PCR NEGATIVE NEGATIVE Final    Comment: (NOTE) The Xpert Xpress SARS-CoV-2/FLU/RSV plus assay is intended as an aid in the diagnosis of influenza from Nasopharyngeal swab specimens and should not be used as a sole basis for treatment. Nasal washings and aspirates are unacceptable for Xpert Xpress SARS-CoV-2/FLU/RSV testing.  Fact Sheet for Patients: EntrepreneurPulse.com.au  Fact Sheet for Healthcare Providers: IncredibleEmployment.be  This test is not yet approved or cleared by the [place] FDA and has been authorized for detection and/or diagnosis of SARS-CoV-2 by FDA under an Emergency Use Authorization (EUA). This EUA will remain in effect (meaning this test can be used) for the duration of the COVID-19 declaration under Section 564(b)(1) of the Act, 21 U.S.C. section 360bbb-3(b)(1), unless the authorization is terminated or revoked.  Performed at [facility], [place] [address] [place], [place] [address]   Gastrointestinal Panel by PCR , Stool     Status: None   Collection Time: 01/18/21  1:09 PM   Specimen: Stool  Result Value Ref Range Status   Campylobacter species NOT DETECTED NOT DETECTED Final   Plesimonas shigelloides NOT DETECTED NOT DETECTED Final   Salmonella species NOT DETECTED NOT DETECTED Final    Yersinia enterocolitica NOT DETECTED NOT DETECTED Final   Vibrio species NOT DETECTED NOT DETECTED Final   Vibrio cholerae NOT DETECTED NOT DETECTED Final   Enteroaggregative E coli (EAEC) NOT DETECTED NOT DETECTED Final   Enteropathogenic E coli (EPEC) NOT DETECTED NOT DETECTED Final   Enterotoxigenic E coli (ETEC) NOT DETECTED NOT DETECTED Final   Shiga like toxin producing E coli (STEC) NOT DETECTED NOT DETECTED Final   Shigella/Enteroinvasive E coli (EIEC) NOT DETECTED NOT DETECTED Final   Cryptosporidium NOT DETECTED NOT DETECTED Final   Cyclospora cayetanensis NOT DETECTED NOT DETECTED Final   Entamoeba histolytica NOT DETECTED NOT DETECTED Final   Giardia lamblia NOT DETECTED NOT DETECTED Final   Adenovirus F40/41 NOT DETECTED NOT DETECTED Final   Astrovirus NOT DETECTED NOT DETECTED Final   Norovirus GI/GII NOT DETECTED NOT DETECTED Final   Rotavirus A NOT DETECTED NOT DETECTED Final   Sapovirus (I, II, IV, and V) NOT  DETECTED NOT DETECTED Final    Comment: Performed at [facility], [place], [place] [address]  C Difficile Quick Screen w PCR reflex     Status: None   Collection Time: 01/18/21  1:09 PM   Specimen: Stool  Result Value Ref Range Status   C Diff antigen NEGATIVE NEGATIVE Final   C Diff toxin NEGATIVE NEGATIVE Final   C Diff interpretation No C. difficile detected.  Final    Comment: Performed at [facility], [place] [address], [place], [place] [address]  Resp Panel by RT-PCR (Flu A&B, Covid) Nasopharyngeal Swab     Status: None   Collection Time: 01/20/21  2:32 PM   Specimen: Nasopharyngeal Swab; Nasopharyngeal(NP) swabs in vial transport medium  Result Value Ref Range Status   SARS Coronavirus 2 by RT PCR NEGATIVE NEGATIVE Final    Comment: (NOTE) SARS-CoV-2 target nucleic acids are NOT DETECTED.  The SARS-CoV-2 RNA is generally detectable in upper respiratory specimens during the acute phase of infection. The  lowest concentration of SARS-CoV-2 viral copies this assay can detect is 138 copies/mL. A negative result does not preclude SARS-Cov-2 infection and should not be used as the sole basis for treatment or other patient management decisions. A negative result may occur with  improper specimen collection/handling, submission of specimen other than nasopharyngeal swab, presence of viral mutation(s) within the areas targeted by this assay, and inadequate number of viral copies(<138 copies/mL). A negative result must be combined with clinical observations, patient history, and epidemiological information. The expected result is Negative.  Fact Sheet for Patients:  EntrepreneurPulse.com.au  Fact Sheet for Healthcare Providers:  IncredibleEmployment.be  This test is no t yet approved or cleared by the [place] FDA and  has been authorized for detection and/or diagnosis of SARS-CoV-2 by FDA under an Emergency Use Authorization (EUA). This EUA will remain  in effect (meaning this test can be used) for the duration of the COVID-19 declaration under Section 564(b)(1) of the Act, 21 U.S.C.section 360bbb-3(b)(1), unless the authorization is terminated  or revoked sooner.       Influenza A by PCR NEGATIVE NEGATIVE Final   Influenza B by PCR NEGATIVE NEGATIVE Final    Comment: (NOTE) The Xpert Xpress SARS-CoV-2/FLU/RSV plus assay is intended as an aid in the diagnosis of influenza from Nasopharyngeal swab specimens and should not be used as a sole basis for treatment. Nasal washings and aspirates are unacceptable for Xpert Xpress SARS-CoV-2/FLU/RSV testing.  Fact Sheet for Patients: EntrepreneurPulse.com.au  Fact Sheet for Healthcare Providers: IncredibleEmployment.be  This test is not yet approved or cleared by the [place] FDA and has been authorized for detection and/or diagnosis of SARS-CoV-2 by FDA under  an Emergency Use Authorization (EUA). This EUA will remain in effect (meaning this test can be used) for the duration of the COVID-19 declaration under Section 564(b)(1) of the Act, 21 U.S.C. section 360bbb-3(b)(1), unless the authorization is terminated or revoked.  Performed at [facility], [place] [address], [place],  [address]   Resp Panel by RT-PCR (Flu A&B, Covid) Nasopharyngeal Swab     Status: None   Collection Time: 01/24/21  2:07 PM   Specimen: Nasopharyngeal Swab; Nasopharyngeal(NP) swabs in vial transport medium  Result Value Ref Range Status   SARS Coronavirus 2 by RT PCR NEGATIVE NEGATIVE Final    Comment: (NOTE) SARS-CoV-2 target nucleic acids are NOT DETECTED.  The SARS-CoV-2 RNA is generally detectable in upper respiratory specimens during the  acute phase of infection. The lowest concentration of SARS-CoV-2 viral copies this assay can detect is 138 copies/mL. A negative result does not preclude SARS-Cov-2 infection and should not be used as the sole basis for treatment or other patient management decisions. A negative result may occur with  improper specimen collection/handling, submission of specimen other than nasopharyngeal swab, presence of viral mutation(s) within the areas targeted by this assay, and inadequate number of viral copies(<138 copies/mL). A negative result must be combined with clinical observations, patient history, and epidemiological information. The expected result is Negative.  Fact Sheet for Patients:  EntrepreneurPulse.com.au  Fact Sheet for Healthcare Providers:  IncredibleEmployment.be  This test is no t yet approved or cleared by the [place] FDA and  has been authorized for detection and/or diagnosis of SARS-CoV-2 by FDA under an Emergency Use Authorization (EUA). This EUA will remain  in effect (meaning this test can be used) for the duration of the COVID-19 declaration under  Section 564(b)(1) of the Act, 21 U.S.C.section 360bbb-3(b)(1), unless the authorization is terminated  or revoked sooner.       Influenza A by PCR NEGATIVE NEGATIVE Final   Influenza B by PCR NEGATIVE NEGATIVE Final    Comment: (NOTE) The Xpert Xpress SARS-CoV-2/FLU/RSV plus assay is intended as an aid in the diagnosis of influenza from Nasopharyngeal swab specimens and should not be used as a sole basis for treatment. Nasal washings and aspirates are unacceptable for Xpert Xpress SARS-CoV-2/FLU/RSV testing.  Fact Sheet for Patients: EntrepreneurPulse.com.au  Fact Sheet for Healthcare Providers: IncredibleEmployment.be  This test is not yet approved or cleared by the [place] FDA and has been authorized for detection and/or diagnosis of SARS-CoV-2 by FDA under an Emergency Use Authorization (EUA). This EUA will remain in effect (meaning this test can be used) for the duration of the COVID-19 declaration under Section 564(b)(1) of the Act, 21 U.S.C. section 360bbb-3(b)(1), unless the authorization is terminated or revoked.  Performed at [facility], [place] [address] [place], [place] [address]     Radiology Studies: No results found.     Victoria Levine  If 7PM-7AM, please contact night-coverage [email or web address] 01/25/2021, 3:10 PM

## 2021-01-26 ENCOUNTER — Observation Stay (HOSPITAL_COMMUNITY): Payer: Medicare HMO

## 2021-01-26 DIAGNOSIS — E860 Dehydration: Secondary | ICD-10-CM

## 2021-01-26 DIAGNOSIS — E038 Other specified hypothyroidism: Secondary | ICD-10-CM | POA: Diagnosis not present

## 2021-01-26 DIAGNOSIS — E876 Hypokalemia: Secondary | ICD-10-CM | POA: Diagnosis not present

## 2021-01-26 DIAGNOSIS — R519 Headache, unspecified: Secondary | ICD-10-CM | POA: Diagnosis not present

## 2021-01-26 DIAGNOSIS — R112 Nausea with vomiting, unspecified: Secondary | ICD-10-CM | POA: Diagnosis not present

## 2021-01-26 DIAGNOSIS — R531 Weakness: Secondary | ICD-10-CM | POA: Diagnosis not present

## 2021-01-26 DIAGNOSIS — R7401 Elevation of levels of liver transaminase levels: Secondary | ICD-10-CM | POA: Diagnosis not present

## 2021-01-26 DIAGNOSIS — R197 Diarrhea, unspecified: Secondary | ICD-10-CM | POA: Diagnosis not present

## 2021-01-26 MED ORDER — METOCLOPRAMIDE HCL 5 MG/ML IJ SOLN
10.0000 mg | Freq: Once | INTRAMUSCULAR | Status: AC
  Administered 2021-01-26: 10 mg via INTRAVENOUS
  Filled 2021-01-26: qty 2

## 2021-01-26 MED ORDER — SUMATRIPTAN SUCCINATE 50 MG PO TABS
50.0000 mg | ORAL_TABLET | ORAL | Status: DC | PRN
  Administered 2021-01-27: 50 mg via ORAL
  Filled 2021-01-26 (×2): qty 1

## 2021-01-26 MED ORDER — KETOROLAC TROMETHAMINE 30 MG/ML IJ SOLN: 30.0000 mg | Freq: Once | INTRAMUSCULAR | Status: DC

## 2021-01-26 MED ORDER — NAPROXEN SODIUM 275 MG PO TABS
550.0000 mg | ORAL_TABLET | Freq: Two times a day (BID) | ORAL | Status: DC | PRN
  Filled 2021-01-26: qty 2

## 2021-01-26 MED ORDER — DEXAMETHASONE SODIUM PHOSPHATE 10 MG/ML IJ SOLN
10.0000 mg | Freq: Once | INTRAMUSCULAR | Status: DC
  Filled 2021-01-26: qty 1

## 2021-01-26 MED ORDER — SUMATRIPTAN SUCCINATE 6 MG/0.5ML ~~LOC~~ SOLN
6.0000 mg | SUBCUTANEOUS | Status: AC | PRN
  Administered 2021-01-26 (×2): 6 mg via SUBCUTANEOUS
  Filled 2021-01-26 (×4): qty 0.5

## 2021-01-26 MED ORDER — METOCLOPRAMIDE HCL 5 MG/ML IJ SOLN: 5.0000 mg | Freq: Four times a day (QID) | INTRAMUSCULAR | Status: DC | PRN

## 2021-01-26 MED ORDER — DIPHENHYDRAMINE HCL 50 MG/ML IJ SOLN: 25.0000 mg | Freq: Once | INTRAMUSCULAR | Status: DC

## 2021-01-26 NOTE — TOC Initial Note (Signed)
Transition of Care [facility]) - Initial/Assessment Note    Patient Details  Name: Victoria Levine MRN: [id number] Date of Birth: 1951-02-28  Transition of [facility]) CM/SW Contact:    Marilu Favre, RN Phone Number: 01/26/2021, 10:27 AM  Clinical Narrative:                 Spoke to patient at bedside with SW.    Discussed PT recommendation for SNF. Patient voiced understanding but states she is tired of waiting on [facility], and wants to go home. Discussed last time she went home , she come right back. Patient states she is stronger this time, and she has friends to assist her. She lives at an independent living community and has "a lot" of neighbors who have already offered to cook meals for her and assist her.    Last admission patient was set up with [facility], patient was readmitted to hospital before they could make first home visit. Patient wants to continue with [facility]. NCM called Stacie with [facility], she has orders from last admission, and since this admission is observation she does not need new orders.   Patient is requesting wheel chair and a 3 in1.   Patient has 2 walkers , rollator and a 3 in 1 already. She would like another 3 in 1 if possible. She is requesting wheel chair and 3 in 1 be delivered to her address: [place], phone number to call for delivery is [phone].   NCM called Freda Munro with [facility], she still has orders for wheel chair and 3in 1 from last admission and will call patient for delivery.    Patient states she has transportation home.     Secure chatted MD and PT. PT will see patient again early afternoon today.       Expected Discharge Plan: [place]     Patient Goals and CMS Choice Patient states their goals for this hospitalization and ongoing recovery are:: to return to home CMS Medicare.gov Compare Post Acute Care list provided to:: Patient Choice offered to /  list presented to : Patient  Expected Discharge Plan and Services Expected Discharge Plan: [place]   Discharge Planning Services: CM Consult Post Acute Care Choice: Home Health, Durable Medical Equipment Living arrangements for the past 2 months: Apartment Expected Discharge Date: 01/26/21               DME Arranged: 3-N-1, Wheelchair manual DME Agency: [employer] Date DME Agency Contacted: 01/26/21 Time DME Agency Contacted: 1026 Representative spoke with at DME Agency: Freda Munro HH Arranged: PT, OT Long Agency: [place] Date [place]: 01/26/21 Time [place]: 36 Representative spoke with at [place]: [place] Arrangements/Services Living arrangements for the past 2 months: [place] with:: Self Patient language and need for interpreter reviewed:: Yes Do you feel safe going back to the place where you live?: Yes      Need for Family Participation in Patient Care: Yes (Comment) Care giver support system in place?: Yes (comment) Current home services: DME Criminal Activity/Legal Involvement Pertinent to Current Situation/Hospitalization: No - Comment as needed  Activities of Daily Living      Permission Sought/Granted   Permission granted to share information with : No              Emotional Assessment Appearance:: Appears stated age Attitude/Demeanor/Rapport: Engaged Affect (typically observed): Accepting  Orientation: : Oriented to Self, Oriented to Place, Oriented to  Time, Oriented to Situation Alcohol / Substance Use: Not Applicable Psych Involvement: No (comment)  Admission diagnosis:  Diarrhea [R19.7] Dehydration [E86.0] Hypokalemia [E87.6] Hypomagnesemia [E83.42] Generalized weakness [R53.1] Abdominal pain [R10.9] Diarrhea, unspecified type [R19.7] Patient Active Problem List   Diagnosis Date Noted   Diarrhea 01/18/2021   Generalized weakness 01/18/2021   Lactic acidosis  01/14/2021   Sepsis ([place]) 01/14/2021   Hypercalcemia 01/14/2021   Leukocytosis 01/14/2021   Thrombocytosis 01/14/2021   Dehydration 01/14/2021   Transaminitis 01/14/2021   Acute kidney injury ([place]) 01/13/2021   Encephalopathy 03/09/2020   Pressure injury of skin 15/72/6203   Acute metabolic encephalopathy 55/97/4163   Displaced intertrochanteric fracture of left femur, initial encounter for closed fracture ([place]) 02/23/2020   Hypokalemia 02/23/2020   Intractable nausea and vomiting 08/31/2019   Epigastric pain 08/31/2019   Alcohol abuse 08/31/2019   Microcytic anemia 08/31/2019   Closed compression fracture of L2 vertebra ([place]) 12/11/2017   Chronic pain 07/26/2015   Anxiety state 06/28/2015   Benzodiazepine dependence ([place]) 10/13/2014   Opioid abuse with opioid-induced mood disorder ([place]) 10/13/2014   Bipolar 1 disorder, depressed ([place]) 10/12/2014   Spondylolisthesis of lumbar region 07/28/2014   Osteoporosis 11/17/2013   Migraines    Herpes    Hypothyroidism 08/13/2008   HYPERCHOLESTEROLEMIA 08/13/2008   Bipolar disorder ([place]) 08/13/2008   Asthma-chronic obstructive pulmonary disease overlap syndrome ([place]) 08/13/2008   GERD (gastroesophageal reflux disease) 08/13/2008   HIATAL HERNIA 08/13/2008   ALLERGY 08/13/2008   PCP:  [name], MD Pharmacy:   [id number] - [place], [place] [address] [place] [place] [address] Phone: [phone] Fax: [phone]     Social Determinants of Health (SDOH) Interventions    Readmission Risk Interventions Readmission Risk Prevention Plan 02/25/2020  Transportation Screening Complete  Medication Review Press photographer) Complete  PCP or Specialist appointment within 3-5 days of discharge Complete  SW Recovery Care/Counseling Consult Complete  Skilled Nursing Facility Complete  Some recent data might be hidden

## 2021-01-26 NOTE — Care Management (Signed)
    Durable Medical Equipment  (From admission, onward)           Start     Ordered   01/26/21 1046  For home use only DME standard manual wheelchair with seat cushion  Once       Comments: Patient suffers from Generalized weakness which impairs their ability to perform daily activities like ambulating  in the home.  A cane will not resolve issue with performing activities of daily living. A wheelchair will allow patient to safely perform daily activities. Patient can safely propel the wheelchair in the home or has a caregiver who can provide assistance. Length of need lifetime . Accessories: elevating leg rests (ELRs), wheel locks, extensions and anti-tippers.  Seat and back cushions   Call patient for delivery [phone]   [address] [place] , [place] , [place]   01/26/21 1047

## 2021-01-26 NOTE — Progress Notes (Signed)
OT Cancellation Note  Patient Details Name: Victoria Levine MRN: [id number] DOB: 03/07/1951   Cancelled Treatment:    Reason Eval/Treat Not Completed: Medical issues which prohibited therapy (Pt dizzy and nauseous. RN placing IV. Will return as schedule allows.)  [place], OTR/L Acute Rehab Pager: [phone] Office: [phone] 01/26/2021, 3:24 PM

## 2021-01-26 NOTE — Progress Notes (Signed)
PROGRESS NOTE  Victoria Levine DZH:[id number] DOB: 17-Jul-1950   PCP: [name], MD  Patient is from: Home.  Lives alone.  DOA: 01/17/2021 LOS: 0  Chief complaints:  Chief Complaint  Patient presents with   Diarrhea   Weakness     Brief Narrative / Interim history: 70 year old F with PMH of asthma, hypothyroidism, GERD, osteoporosis, anxiety, BPD, alcohol use and recent hospitalization from 10/28-11/1 for weakness, confusion, AKI, SIRS and electrolyte treatment returning with nausea, vomiting, diarrhea and generalized weakness, and and admitted with working diagnosis of gastroenteritis, dehydration and electrolyte derangement.  Initially had SIRS and started on antibiotics that were discontinued as there was no clear source of bacterial infection.  C. difficile and GI panel negative.  She was treated with IV fluids.   GI symptoms and electrolyte derangements resolved. Therapy recommended SNF again but she is "tired of waiting" and wants to go home with [facility] and DME.   Subjective: Seen and examined earlier this morning.  She reports throbbing pain and a swimming head that she attributes to her migraine.  She reports taking Maxalt at home.  She has some nausea and vomiting later in the morning.  She has no focal neuro symptoms  Objective: Vitals:   01/25/21 1542 01/25/21 2142 01/26/21 0547 01/26/21 0732  BP: (!) 156/97 (!) 130/91 124/75 135/76  Pulse: 76 89 78 77  Resp: 18  18 18   Temp: 99.3 F (37.4 C) 98.6 F (37 C) 98.5 F (36.9 C) 98.3 F (36.8 C)  TempSrc: Oral Oral Oral Oral  SpO2: 97% 96% 96% 95%  Weight:      Height:        Intake/Output Summary (Last 24 hours) at 01/26/2021 1520 Last data filed at 01/26/2021 1258 Gross per 24 hour  Intake 480 ml  Output --  Net 480 ml   Filed Weights   01/19/21 0856  Weight: 68.9 kg    Examination:  GENERAL: No apparent distress.  Nontoxic. HEENT: MMM.  Vision and hearing grossly intact.  NECK: Supple.  No apparent  JVD.  RESP: 100% on RA.  No IWOB.  Fair aeration bilaterally. CVS:  RRR. Heart sounds normal.  ABD/GI/GU: BS+. Abd soft, NTND.  MSK/EXT:  Moves extremities. No apparent deformity. No edema.  SKIN: no apparent skin lesion or wound NEURO: Awake, alert and oriented appropriately. Speech clear. Cranial nerves II-XII intact. Motor 5/5 in all muscle groups of UE and LE bilaterally, Normal tone. Light sensation intact in all dermatomes of upper and lower ext bilaterally. Patellar reflex symmetric.  No pronator drift.  Finger to nose intact.  No nystagmus. PSYCH: Calm. Normal affect.   Procedures:  None  Microbiology summarized: [id number] and influenza PCR nonreactive. C. difficile PCR negative. GI panel negative.  Assessment & Plan: Intractable headache due to migraine-has throbbing headache with dizziness, nausea and vomiting.  Typical of her migraine.  No focal neurodeficits on exam.  Migraine did not improve with Imitrex injection x2. -Check CT head without contrast although low suspicion for CVA or significant intracranial process. -Migraine cocktail with Decadron, Toradol, Reglan and Benadryl.  Acute nausea/vomiting/diarrhea/dehydration: Now with nausea and vomiting likely due to migraine headache. -Management as above  SIRS: Likely from dehydration.  Sepsis ruled out.  Hypokalemia/hypomagnesemia: Resolved.   Leukocytosis/bandemia: trending down. -Recheck CBC at follow-up  Hypothyroidism/elevated TSH: Free T4 0.81.  TSH was 15.7 with free T4> 5.5 previous admission.  Doubt accuracy in acute setting.  She might be not compliant with Synthroid. -Continue home  Synthroid -Recheck TSH in 4 to 6 weeks.   Generalized weakness/physical deconditioning: Therapy recommended SNF but she is "tired of waiting" and wanted to go home with home health and DME. -TOC notified  Chronic pain, chronic opioid use: -As needed pain meds -Avoid opiates.   Anxiety/depression/BPD: Stable.   -Continue  with Prozac Wellbutrin and Seroquel   Transaminases: Improved. -Recheck CMP at follow-up   Body mass index is 25.28 kg/m.        DVT prophylaxis:  enoxaparin (LOVENOX) injection 40 mg Start: 01/18/21 1000  Code Status: DNR/DNI Family Communication: Patient and/or RN. Available if any question.  Level of care: Telemetry Medical Status is: Observation  The patient will require care spanning > 2 midnights and should be moved to inpatient because: Intractable headache with dizziness, nausea and vomiting due to migraine requiring IV medications      Consultants:  None   Sch Meds:  Scheduled Meds:  buPROPion  150 mg Oral BID   dexamethasone (DECADRON) injection  10 mg Intravenous Once   diphenhydrAMINE  25 mg Intravenous Once   enoxaparin (LOVENOX) injection  40 mg Subcutaneous Q24H   famotidine  20 mg Oral QHS   FLUoxetine  20 mg Oral BID   ketorolac  30 mg Intravenous Once   levothyroxine  125 mcg Oral QAC breakfast   metoCLOPramide (REGLAN) injection  10 mg Intravenous Once   oxcarbazepine  600 mg Oral BID   oxyCODONE  10 mg Oral Q12H   QUEtiapine  400 mg Oral QPM   Continuous Infusions: PRN Meds:.acetaminophen **OR** acetaminophen, albuterol, gabapentin, hydrALAZINE, loperamide, ondansetron **OR** ondansetron (ZOFRAN) IV, oxyCODONE  Antimicrobials: Anti-infectives (From admission, onward)    None        I have personally reviewed the following labs and images: CBC: No results for input(s): WBC, NEUTROABS, HGB, HCT, MCV, PLT in the last 168 hours.  BMP &GFR Recent Labs  Lab 01/20/21 0645 01/20/21 1235 01/21/21 0840 01/22/21 0928 01/24/21 0156  NA 136 134* 139 135 133*  K 2.9* 3.5 3.6 3.5 3.9  CL 104 102 105 102 101  CO2 28 26 25 28 26   GLUCOSE 83 146* 94 103* 83  BUN 10 10 11 12 12   CREATININE 0.81 0.81 0.83 0.82 0.83  CALCIUM 8.5* 8.5* 8.7* 8.6* 8.8*  MG 1.8  --   --   --   --    Estimated Creatinine Clearance: 60.7 mL/min (by C-G formula  based on SCr of 0.83 mg/dL). Liver & Pancreas: No results for input(s): AST, ALT, ALKPHOS, BILITOT, PROT, ALBUMIN in the last 168 hours. No results for input(s): LIPASE, AMYLASE in the last 168 hours. No results for input(s): AMMONIA in the last 168 hours. Diabetic: No results for input(s): HGBA1C in the last 72 hours. No results for input(s): GLUCAP in the last 168 hours. Cardiac Enzymes: No results for input(s): CKTOTAL, CKMB, CKMBINDEX, TROPONINI in the last 168 hours. No results for input(s): PROBNP in the last 8760 hours. Coagulation Profile: No results for input(s): INR, PROTIME in the last 168 hours. Thyroid Function Tests: No results for input(s): TSH, T4TOTAL, FREET4, T3FREE, THYROIDAB in the last 72 hours. Lipid Profile: No results for input(s): CHOL, HDL, LDLCALC, TRIG, CHOLHDL, LDLDIRECT in the last 72 hours. Anemia Panel: No results for input(s): VITAMINB12, FOLATE, FERRITIN, TIBC, IRON, RETICCTPCT in the last 72 hours. Urine analysis:    Component Value Date/Time   COLORURINE AMBER (A) 01/13/2021 1729   APPEARANCEUR HAZY (A) 01/13/2021 1729  LABSPEC 1.015 01/13/2021 1729   PHURINE 5.0 01/13/2021 1729   GLUCOSEU NEGATIVE 01/13/2021 1729   HGBUR NEGATIVE 01/13/2021 1729   BILIRUBINUR NEGATIVE 01/13/2021 1729   KETONESUR NEGATIVE 01/13/2021 1729   PROTEINUR NEGATIVE 01/13/2021 1729   UROBILINOGEN 0.2 11/12/2013 2233   NITRITE NEGATIVE 01/13/2021 1729   LEUKOCYTESUR NEGATIVE 01/13/2021 1729   Sepsis Labs: Invalid input(s): PROCALCITONIN, [place]  Microbiology: Recent Results (from the past 240 hour(s))  Resp Panel by RT-PCR (Flu A&B, Covid) Nasopharyngeal Swab     Status: None   Collection Time: 01/18/21  6:33 AM   Specimen: Nasopharyngeal Swab; Nasopharyngeal(NP) swabs in vial transport medium  Result Value Ref Range Status   SARS Coronavirus 2 by RT PCR NEGATIVE NEGATIVE Final    Comment: (NOTE) SARS-CoV-2 target nucleic acids are NOT DETECTED.  The  SARS-CoV-2 RNA is generally detectable in upper respiratory specimens during the acute phase of infection. The lowest concentration of SARS-CoV-2 viral copies this assay can detect is 138 copies/mL. A negative result does not preclude SARS-Cov-2 infection and should not be used as the sole basis for treatment or other patient management decisions. A negative result may occur with  improper specimen collection/handling, submission of specimen other than nasopharyngeal swab, presence of viral mutation(s) within the areas targeted by this assay, and inadequate number of viral copies(<138 copies/mL). A negative result must be combined with clinical observations, patient history, and epidemiological information. The expected result is Negative.  Fact Sheet for Patients:  EntrepreneurPulse.com.au  Fact Sheet for Healthcare Providers:  IncredibleEmployment.be  This test is no t yet approved or cleared by the [place] FDA and  has been authorized for detection and/or diagnosis of SARS-CoV-2 by FDA under an Emergency Use Authorization (EUA). This EUA will remain  in effect (meaning this test can be used) for the duration of the COVID-19 declaration under Section 564(b)(1) of the Act, 21 U.S.C.section 360bbb-3(b)(1), unless the authorization is terminated  or revoked sooner.       Influenza A by PCR NEGATIVE NEGATIVE Final   Influenza B by PCR NEGATIVE NEGATIVE Final    Comment: (NOTE) The Xpert Xpress SARS-CoV-2/FLU/RSV plus assay is intended as an aid in the diagnosis of influenza from Nasopharyngeal swab specimens and should not be used as a sole basis for treatment. Nasal washings and aspirates are unacceptable for Xpert Xpress SARS-CoV-2/FLU/RSV testing.  Fact Sheet for Patients: EntrepreneurPulse.com.au  Fact Sheet for Healthcare Providers: IncredibleEmployment.be  This test is not yet approved or  cleared by the [place] FDA and has been authorized for detection and/or diagnosis of SARS-CoV-2 by FDA under an Emergency Use Authorization (EUA). This EUA will remain in effect (meaning this test can be used) for the duration of the COVID-19 declaration under Section 564(b)(1) of the Act, 21 U.S.C. section 360bbb-3(b)(1), unless the authorization is terminated or revoked.  Performed at [facility], [place] [address], [place], [place] [address]   Gastrointestinal Panel by PCR , Stool     Status: None   Collection Time: 01/18/21  1:09 PM   Specimen: Stool  Result Value Ref Range Status   Campylobacter species NOT DETECTED NOT DETECTED Final   Plesimonas shigelloides NOT DETECTED NOT DETECTED Final   Salmonella species NOT DETECTED NOT DETECTED Final   Yersinia enterocolitica NOT DETECTED NOT DETECTED Final   Vibrio species NOT DETECTED NOT DETECTED Final   Vibrio cholerae NOT DETECTED NOT DETECTED Final   Enteroaggregative E coli (EAEC) NOT DETECTED NOT DETECTED Final  Enteropathogenic E coli (EPEC) NOT DETECTED NOT DETECTED Final   Enterotoxigenic E coli (ETEC) NOT DETECTED NOT DETECTED Final   Shiga like toxin producing E coli (STEC) NOT DETECTED NOT DETECTED Final   Shigella/Enteroinvasive E coli (EIEC) NOT DETECTED NOT DETECTED Final   Cryptosporidium NOT DETECTED NOT DETECTED Final   Cyclospora cayetanensis NOT DETECTED NOT DETECTED Final   Entamoeba histolytica NOT DETECTED NOT DETECTED Final   Giardia lamblia NOT DETECTED NOT DETECTED Final   Adenovirus F40/41 NOT DETECTED NOT DETECTED Final   Astrovirus NOT DETECTED NOT DETECTED Final   Norovirus GI/GII NOT DETECTED NOT DETECTED Final   Rotavirus A NOT DETECTED NOT DETECTED Final   Sapovirus (I, II, IV, and V) NOT DETECTED NOT DETECTED Final    Comment: Performed at [facility], [address] [place], [place] [address]  C Difficile Quick Screen w PCR reflex     Status: None   Collection Time:  01/18/21  1:09 PM   Specimen: Stool  Result Value Ref Range Status   C Diff antigen NEGATIVE NEGATIVE Final   C Diff toxin NEGATIVE NEGATIVE Final   C Diff interpretation No C. difficile detected.  Final    Comment: Performed at [facility], [place] [address] [place], [place] [address]  Resp Panel by RT-PCR (Flu A&B, Covid) Nasopharyngeal Swab     Status: None   Collection Time: 01/20/21  2:32 PM   Specimen: Nasopharyngeal Swab; Nasopharyngeal(NP) swabs in vial transport medium  Result Value Ref Range Status   SARS Coronavirus 2 by RT PCR NEGATIVE NEGATIVE Final    Comment: (NOTE) SARS-CoV-2 target nucleic acids are NOT DETECTED.  The SARS-CoV-2 RNA is generally detectable in upper respiratory specimens during the acute phase of infection. The lowest concentration of SARS-CoV-2 viral copies this assay can detect is 138 copies/mL. A negative result does not preclude SARS-Cov-2 infection and should not be used as the sole basis for treatment or other patient management decisions. A negative result may occur with  improper specimen collection/handling, submission of specimen other than nasopharyngeal swab, presence of viral mutation(s) within the areas targeted by this assay, and inadequate number of viral copies(<138 copies/mL). A negative result must be combined with clinical observations, patient history, and epidemiological information. The expected result is Negative.  Fact Sheet for Patients:  EntrepreneurPulse.com.au  Fact Sheet for Healthcare Providers:  IncredibleEmployment.be  This test is no t yet approved or cleared by the [place] FDA and  has been authorized for detection and/or diagnosis of SARS-CoV-2 by FDA under an Emergency Use Authorization (EUA). This EUA will remain  in effect (meaning this test can be used) for the duration of the COVID-19 declaration under Section 564(b)(1) of the Act, 21 U.S.C.section  360bbb-3(b)(1), unless the authorization is terminated  or revoked sooner.       Influenza A by PCR NEGATIVE NEGATIVE Final   Influenza B by PCR NEGATIVE NEGATIVE Final    Comment: (NOTE) The Xpert Xpress SARS-CoV-2/FLU/RSV plus assay is intended as an aid in the diagnosis of influenza from Nasopharyngeal swab specimens and should not be used as a sole basis for treatment. Nasal washings and aspirates are unacceptable for Xpert Xpress SARS-CoV-2/FLU/RSV testing.  Fact Sheet for Patients: EntrepreneurPulse.com.au  Fact Sheet for Healthcare Providers: IncredibleEmployment.be  This test is not yet approved or cleared by the [place] FDA and has been authorized for detection and/or diagnosis of SARS-CoV-2 by FDA under an Emergency Use Authorization (EUA). This EUA will remain  in effect (meaning this test can be used) for the duration of the COVID-19 declaration under Section 564(b)(1) of the Act, 21 U.S.C. section 360bbb-3(b)(1), unless the authorization is terminated or revoked.  Performed at [facility], [place] [address], [place], [place] [address]   Resp Panel by RT-PCR (Flu A&B, Covid) Nasopharyngeal Swab     Status: None   Collection Time: 01/24/21  2:07 PM   Specimen: Nasopharyngeal Swab; Nasopharyngeal(NP) swabs in vial transport medium  Result Value Ref Range Status   SARS Coronavirus 2 by RT PCR NEGATIVE NEGATIVE Final    Comment: (NOTE) SARS-CoV-2 target nucleic acids are NOT DETECTED.  The SARS-CoV-2 RNA is generally detectable in upper respiratory specimens during the acute phase of infection. The lowest concentration of SARS-CoV-2 viral copies this assay can detect is 138 copies/mL. A negative result does not preclude SARS-Cov-2 infection and should not be used as the sole basis for treatment or other patient management decisions. A negative result may occur with  improper specimen collection/handling, submission of  specimen other than nasopharyngeal swab, presence of viral mutation(s) within the areas targeted by this assay, and inadequate number of viral copies(<138 copies/mL). A negative result must be combined with clinical observations, patient history, and epidemiological information. The expected result is Negative.  Fact Sheet for Patients:  EntrepreneurPulse.com.au  Fact Sheet for Healthcare Providers:  IncredibleEmployment.be  This test is no t yet approved or cleared by the [place] FDA and  has been authorized for detection and/or diagnosis of SARS-CoV-2 by FDA under an Emergency Use Authorization (EUA). This EUA will remain  in effect (meaning this test can be used) for the duration of the COVID-19 declaration under Section 564(b)(1) of the Act, 21 U.S.C.section 360bbb-3(b)(1), unless the authorization is terminated  or revoked sooner.       Influenza A by PCR NEGATIVE NEGATIVE Final   Influenza B by PCR NEGATIVE NEGATIVE Final    Comment: (NOTE) The Xpert Xpress SARS-CoV-2/FLU/RSV plus assay is intended as an aid in the diagnosis of influenza from Nasopharyngeal swab specimens and should not be used as a sole basis for treatment. Nasal washings and aspirates are unacceptable for Xpert Xpress SARS-CoV-2/FLU/RSV testing.  Fact Sheet for Patients: EntrepreneurPulse.com.au  Fact Sheet for Healthcare Providers: IncredibleEmployment.be  This test is not yet approved or cleared by the [place] FDA and has been authorized for detection and/or diagnosis of SARS-CoV-2 by FDA under an Emergency Use Authorization (EUA). This EUA will remain in effect (meaning this test can be used) for the duration of the COVID-19 declaration under Section 564(b)(1) of the Act, 21 U.S.C. section 360bbb-3(b)(1), unless the authorization is terminated or revoked.  Performed at [facility], [place] [address],  [place], [place] [address]     Radiology Studies: No results found.     Kihanna Kamiya T. [place]  If 7PM-7AM, please contact night-coverage [email or web address] 01/26/2021, 3:20 PM

## 2021-01-26 NOTE — Progress Notes (Signed)
PT Cancellation Note  Patient Details Name: Victoria Levine MRN: [id number] DOB: Feb 25, 1951   Cancelled Treatment:    Reason Eval/Treat Not Completed: Patient not medically ready.  Pt currently vomiting on arrival.  Will hold today and see as able 11/11. 01/26/2021  Ginger Carne., PT Acute Rehabilitation Services [phone]  (pager) [phone]  (office)   Victoria Levine 01/26/2021, 4:13 PM

## 2021-01-26 NOTE — Progress Notes (Signed)
Mobility Specialist Progress Note:   01/26/21 1005  Mobility  Activity Ambulated in hall  Level of Assistance Contact guard assist, steadying assist  Assistive Device Front wheel walker  Distance Ambulated (ft) 220 ft  Mobility Ambulated independently in hallway  Mobility Response Tolerated well  Mobility performed by Mobility specialist  Bed Position Chair  Transport method Ambulatory  $Mobility charge 1 Mobility   Pt c/o dizziness upon standing and during ambulation. Required x1 seated rest break. Pt pleased with improvement. Eager for d/c.  Nelta Numbers Mobility Specialist  Phone [phone]

## 2021-01-27 ENCOUNTER — Other Ambulatory Visit (HOSPITAL_COMMUNITY): Payer: Self-pay

## 2021-01-27 DIAGNOSIS — F32A Depression, unspecified: Secondary | ICD-10-CM

## 2021-01-27 DIAGNOSIS — E038 Other specified hypothyroidism: Secondary | ICD-10-CM | POA: Diagnosis not present

## 2021-01-27 DIAGNOSIS — M545 Low back pain, unspecified: Secondary | ICD-10-CM

## 2021-01-27 DIAGNOSIS — R7401 Elevation of levels of liver transaminase levels: Secondary | ICD-10-CM | POA: Diagnosis not present

## 2021-01-27 DIAGNOSIS — F319 Bipolar disorder, unspecified: Secondary | ICD-10-CM | POA: Diagnosis not present

## 2021-01-27 DIAGNOSIS — Z79891 Long term (current) use of opiate analgesic: Secondary | ICD-10-CM

## 2021-01-27 DIAGNOSIS — R197 Diarrhea, unspecified: Secondary | ICD-10-CM | POA: Diagnosis not present

## 2021-01-27 DIAGNOSIS — E876 Hypokalemia: Secondary | ICD-10-CM | POA: Diagnosis not present

## 2021-01-27 DIAGNOSIS — G43819 Other migraine, intractable, without status migrainosus: Secondary | ICD-10-CM

## 2021-01-27 DIAGNOSIS — R112 Nausea with vomiting, unspecified: Secondary | ICD-10-CM | POA: Diagnosis not present

## 2021-01-27 DIAGNOSIS — F419 Anxiety disorder, unspecified: Secondary | ICD-10-CM

## 2021-01-27 DIAGNOSIS — R5381 Other malaise: Secondary | ICD-10-CM

## 2021-01-27 LAB — RENAL FUNCTION PANEL
Albumin: 2.7 g/dL — ABNORMAL LOW (ref 3.5–5.0)
Anion gap: 6 (ref 5–15)
BUN: 11 mg/dL (ref 8–23)
CO2: 29 mmol/L (ref 22–32)
Calcium: 9.1 mg/dL (ref 8.9–10.3)
Chloride: 100 mmol/L (ref 98–111)
Creatinine, Ser: 0.8 mg/dL (ref 0.44–1.00)
GFR, Estimated: >60 mL/min (ref >60)
Glucose, Bld: 99 mg/dL (ref 70–99)
Phosphorus: 2.4 mg/dL — ABNORMAL LOW (ref 2.5–4.6)
Potassium: 3.7 mmol/L (ref 3.5–5.1)
Sodium: 135 mmol/L (ref 135–145)

## 2021-01-27 LAB — CBC
HCT: 35 % — ABNORMAL LOW (ref 36.0–46.0)
Hemoglobin: 11.6 g/dL — ABNORMAL LOW (ref 12.0–15.0)
MCH: 30.9 pg (ref 26.0–34.0)
MCHC: 33.1 g/dL (ref 30.0–36.0)
MCV: 93.3 fL (ref 80.0–100.0)
Platelets: 310 10*3/uL (ref 150–400)
RBC: 3.75 MIL/uL — ABNORMAL LOW (ref 3.87–5.11)
RDW: 13.3 % (ref 11.5–15.5)
WBC: 6.8 10*3/uL (ref 4.0–10.5)
nRBC: 0 % (ref 0.0–0.2)

## 2021-01-27 LAB — MAGNESIUM: Magnesium: 1.6 mg/dL — ABNORMAL LOW (ref 1.7–2.4)

## 2021-01-27 MED ORDER — RIZATRIPTAN BENZOATE 10 MG PO TABS
10.0000 mg | ORAL_TABLET | Freq: Every day | ORAL | 5 refills | Status: AC | PRN
  Filled 2021-01-27: qty 10, 10d supply, fill #0

## 2021-01-27 MED ORDER — MAGNESIUM SULFATE 2 GM/50ML IV SOLN
2.0000 g | Freq: Once | INTRAVENOUS | Status: AC
  Administered 2021-01-27: 2 g via INTRAVENOUS
  Filled 2021-01-27: qty 50

## 2021-01-27 MED ORDER — LEVOTHYROXINE SODIUM 125 MCG PO TABS
125.0000 ug | ORAL_TABLET | Freq: Every day | ORAL | 11 refills | Status: AC
  Filled 2021-01-27: qty 30, 30d supply, fill #0

## 2021-01-27 NOTE — Progress Notes (Signed)
Mobility Specialist Progress Note:   01/27/21 0950  Mobility  Activity Ambulated to bathroom  Level of Assistance Standby assist, set-up cues, supervision of patient - no hands on  Assistive Device Front wheel walker  Distance Ambulated (ft) 20 ft  Mobility Out of bed for toileting  Mobility Response Tolerated well  Mobility performed by Mobility specialist  $Mobility charge 1 Mobility   Pt asx during ambulation to BR. BM successful. No dizziness, N/V, weakness. Ready to be d/c.  Nelta Numbers Mobility Specialist  Phone [phone]

## 2021-01-27 NOTE — Discharge Summary (Signed)
Physician Discharge Summary  Victoria Levine MWU:[id number] DOB: 05/14/1950 DOA: 01/17/2021  PCP: [name], MD  Admit date: 01/17/2021 Discharge date: 01/27/2021 Admitted From: Home Disposition: Home Recommendations for Outpatient Follow-up:  Follow ups as below. Please obtain CBC/BMP/Mag at follow up Please follow up on the following pending results: None Home Health: Patient has home health set up previous hospitalization. Equipment/Devices: Wheelchair, and 3 in 1 commode.  Patient has rolling walker from previous admission Discharge Condition: Stable CODE STATUS: DNR/DNI  Follow-up Information     [name], MD. Schedule an appointment as soon as possible for a visit in 1 week(s).   Specialty: Family Medicine Contact information: [place] [address] [place] [facility] Course: 70 year old F with PMH of asthma, hypothyroidism, GERD, osteoporosis, anxiety, BPD, alcohol use and recent hospitalization from 10/28-11/1 for weakness, confusion, AKI, SIRS and electrolyte treatment returning with nausea, vomiting, diarrhea and generalized weakness, and and admitted with working diagnosis of gastroenteritis, dehydration and electrolyte derangement.  Initially had SIRS and started on antibiotics that were discontinued as there was no clear source of bacterial infection.  C. difficile and GI panel negative.  She was treated with IV fluids.   GI symptoms and electrolyte derangements resolved. Therapy recommended SNF again but she is "tired of waiting" and wants to go home with [facility] and DME. Patient states she is stronger this time, and she has friends to assist her. She lives at an independent living community and has "a lot" of neighbors who have already offered to cook meals for her and assist her.   See individual problem list below for more on hospital course.  Discharge Diagnoses:  Migraine headache-has throbbing  headache with dizziness, nausea and vomiting.  Typical of her migraine.  No focal neurodeficits on exam.  CT head reassuring.  Resolved after Imitrex and Reglan. -Renewed Rx for home Maxalt   Acute nausea/vomiting/diarrhea/dehydration: Unclear cause of this.  Work-up unrevealing.  Migraine and opiate use could contribute. Resolved. -Management as above   SIRS: Likely from dehydration.  Sepsis ruled out.   Hypokalemia/hypomagnesemia: Resolved.   Leukocytosis/bandemia: trending down. -Recheck CBC at follow-up   Hypothyroidism/elevated TSH: Free T4 0.81.  TSH was 15.7 with free T4> 5.5 previous admission.  Doubt accuracy in acute setting.  She might be not compliant with Synthroid. -Synthroid increased from 112 to 125 mcg daily -Recheck TSH in 4 to 6 weeks.   Generalized weakness/physical deconditioning: Therapy recommended SNF but she prefers to go home.  She has capacity to make medical decision. -Discharged home with home health and DME   Chronic pain, chronic opioid use: she is at risk for polypharmacy and opiate overuse -Continue home meds -Encouraged to discuss with her prescribers   Anxiety/depression/BPD: Stable.   -Continue with Prozac Wellbutrin and Seroquel   Transaminases: Improved. -Recheck CMP at follow-up  Body mass index is 25.28 kg/m.           Discharge Exam: Vitals:   01/26/21 0732 01/26/21 2010 01/27/21 0431 01/27/21 0743  BP: 135/76 130/84 121/86 (!) 156/92  Pulse: 77 79 80 76  Temp: 98.3 F (36.8 C) 98.7 F (37.1 C) 98.5 F (36.9 C) 98.2 F (36.8 C)  Resp: 18 18 17 18   Height:      Weight:      SpO2: 95% 96% 95% 95%  TempSrc: Oral Oral Oral   BMI (Calculated):  GENERAL: No apparent distress.  Nontoxic. HEENT: MMM.  Vision and hearing grossly intact.  NECK: Supple.  No apparent JVD.  RESP: 95% on RA.  No IWOB.  Fair aeration bilaterally. CVS:  RRR. Heart sounds normal.  ABD/GI/GU: Bowel sounds present. Soft. Non tender.  MSK/EXT:   Moves extremities. No apparent deformity. No edema.  SKIN: no apparent skin lesion or wound NEURO: Awake and alert.  Oriented appropriately.  No apparent focal neuro deficit. PSYCH: Calm. Normal affect.   Discharge Instructions  Discharge Instructions     Call MD for:  difficulty breathing, headache or visual disturbances   Complete by: As directed    Call MD for:  extreme fatigue   Complete by: As directed    Call MD for:  persistant dizziness or light-headedness   Complete by: As directed    Call MD for:  persistant nausea and vomiting   Complete by: As directed    Call MD for:  severe uncontrolled pain   Complete by: As directed    Diet - low sodium heart healthy   Complete by: As directed    Diet general   Complete by: As directed    Discharge instructions   Complete by: As directed    It has been a pleasure taking care of you!  You were hospitalized with generalized weakness, kidney injury and electrolyte abnormality likely from dehydration due to nausea, vomiting and diarrhea.  Your symptoms improved with IV fluid hydration and medication for nausea and vomiting.  Please review your new medication list and the directions on your medications before you take them. Follow-up with your primary care doctor in 1 to 2 weeks or sooner if needed.   Take care,   Increase activity slowly   Complete by: As directed    Increase activity slowly   Complete by: As directed    No wound care   Complete by: As directed       Allergies as of 01/27/2021       Reactions   Prednisone Other (See Comments)   Cannot tolerate ORALLY, but can tolerate by shot (interacts with anxiety & bipolarity)   Lithium Carbonate Other (See Comments)   Patient was taken off of this because of a weight gain of 40 pounds   Other Other (See Comments)   Serious reaction to 1st COVID vaccination - unknown type   Sulfa Antibiotics Other (See Comments)   From childhood; reaction not recalled         Medication List     STOP taking these medications    rizatriptan 10 MG disintegrating tablet Commonly known as: MAXALT-MLT Replaced by: rizatriptan 10 MG tablet       TAKE these medications    albuterol 108 (90 Base) MCG/ACT inhaler Commonly known as: VENTOLIN HFA Inhale 2 puffs into the lungs every 6 (six) hours as needed for wheezing or shortness of breath.   albuterol (2.5 MG/3ML) 0.083% nebulizer solution Commonly known as: PROVENTIL Take 2.5 mg by nebulization every 6 (six) hours as needed for wheezing or shortness of breath.   buPROPion 150 MG 24 hr tablet Commonly known as: WELLBUTRIN XL Take 150 mg by mouth 2 (two) times daily.   EPINEPHrine 0.3 mg/0.3 mL Soaj injection Commonly known as: EPI-PEN Inject 0.3 mg into the skin daily as needed for anaphylaxis.   famotidine 20 MG tablet Commonly known as: PEPCID Take 1 tablet (20 mg total) by mouth at bedtime.   FLUoxetine 20 MG capsule Commonly  known as: PROZAC Take 20 mg by mouth 2 (two) times daily.   gabapentin 600 MG tablet Commonly known as: NEURONTIN Take 600 mg by mouth 3 (three) times daily as needed (pain).   levothyroxine 125 MCG tablet Commonly known as: SYNTHROID Take 1 tablet (125 mcg total) by mouth daily. What changed:  medication strength how much to take when to take this   loperamide 2 MG capsule Commonly known as: IMODIUM Take 2 capsules (4 mg total) by mouth as needed for diarrhea or loose stools.   multivitamin with minerals Tabs tablet Take 1 tablet by mouth daily.   oxcarbazepine 600 MG tablet Commonly known as: TRILEPTAL Take 600 mg by mouth 2 (two) times daily.   Oxycodone HCl 10 MG Tabs Take 1 tablet (10 mg total) by mouth every 4 (four) hours as needed (for breakthrough pain). What changed:  when to take this reasons to take this   QUEtiapine 400 MG tablet Commonly known as: SEROQUEL Take 400 mg by mouth every evening.   rizatriptan 10 MG tablet Commonly known  as: MAXALT Take 1 tablet (10 mg total) by mouth daily as needed for migraine. Replaces: rizatriptan 10 MG disintegrating tablet   Xtampza ER 9 MG C12a Generic drug: oxyCODONE ER Take 9 mg by mouth 2 (two) times daily.               Durable Medical Equipment  (From admission, onward)           Start     Ordered   01/26/21 1046  For home use only DME standard manual wheelchair with seat cushion  Once       Comments: Patient suffers from Generalized weakness which impairs their ability to perform daily activities like ambulating  in the home.  A cane will not resolve issue with performing activities of daily living. A wheelchair will allow patient to safely perform daily activities. Patient can safely propel the wheelchair in the home or has a caregiver who can provide assistance. Length of need lifetime . Accessories: elevating leg rests (ELRs), wheel locks, extensions and anti-tippers.  Seat and back cushions   Call patient for delivery [phone]   [address] [place] , [place] , [place]   01/26/21 1047            Consultations: None  Procedures/Studies:   CT ABDOMEN PELVIS WO CONTRAST  Result Date: 01/13/2021 CLINICAL DATA:  Sepsis EXAM: CT ABDOMEN AND PELVIS WITHOUT CONTRAST TECHNIQUE: Multidetector CT imaging of the abdomen and pelvis was performed following the standard protocol without IV contrast. COMPARISON:  Virtual colonoscopy dated 11/17/2019 FINDINGS: Lower chest: Although not ordered, almost the entire chest is within the field of view. No evidence of pneumonia. Mild compressive atelectasis in the medial right middle lobe. No pleural effusion or pneumothorax. Large fat containing anterior hernia along the right anterior aspect of the heart. The heart is normal in size. No pericardial effusion. Mild atherosclerotic calcifications of the arch. Hepatobiliary: Unenhanced liver is unremarkable. Gallbladder is unremarkable. No intrahepatic or extrahepatic  ductal dilatation. Pancreas: Within normal limits. Spleen: Within normal limits. Adrenals/Urinary Tract: Adrenal glands are within normal limits. Kidneys are within normal limits. No renal calculi or hydronephrosis. Bladder decompressed by an indwelling Foley catheter with trace gas. Stomach/Bowel: Stomach is notable for a moderate hiatal hernia/intrathoracic stomach. No evidence of bowel obstruction. Normal appendix (series 8/image 4). Mild sigmoid diverticulosis, without evidence of diverticulitis. Vascular/Lymphatic: No evidence of abdominal aortic aneurysm. Atherosclerotic calcifications of  the abdominal aorta and branch vessels. No suspicious abdominopelvic lymphadenopathy. Reproductive: Uterus is within normal limits. Bilateral ovaries are unremarkable. Other: No abdominopelvic ascites. Musculoskeletal: Severe compression fracture deformity at T3 with mild retropulsion, age indeterminate. Mild superior endplate compression fracture deformity at T8, likely chronic. Prior vertebral augmentation at T12 and L2. Status post PLIF at L3-5. Degenerative changes of the lumbar spine. Left hip arthroplasty. IMPRESSION: No evidence of bowel obstruction. Normal appendix. Mild sigmoid diverticulosis, without evidence of diverticulitis. Bladder decompressed by an indwelling Foley catheter. No evidence of pneumonia. Moderate hiatal hernia.  Large anterior hernia with fat. Severe compression fracture deformity at T3 with mild retropulsion, age indeterminate. Electronically Signed   By: Julian Hy M.D.   On: 01/13/2021 20:07   CT HEAD WO CONTRAST (5MM)  Result Date: 01/26/2021 CLINICAL DATA:  Headache, classic migraine EXAM: CT HEAD WITHOUT CONTRAST TECHNIQUE: Contiguous axial images were obtained from the base of the skull through the vertex without intravenous contrast. COMPARISON:  CT head January 13, 2021. FINDINGS: Brain: No evidence of acute infarction, hemorrhage, hydrocephalus, extra-axial collection or  intraparenchymal mass lesion/mass effect. Similar 16 mm lobular focus of calcification along the posterior aspect of the left falx, likely representing a meningioma. Tiny remote right cerebellar infarct. Similar patchy white matter hypoattenuation, nonspecific but compatible with chronic microvascular ischemic disease. Similar atrophy. Vascular: No hyperdense vessel identified. Skull: No evidence of acute fracture. Subcentimeter ground-glass lesion in the left paramedian frontal calvarium which demonstrates long-term stability, compatible with benign lesion. Sinuses/Orbits: Visualized sinuses are largely clear. Other: No mastoid effusions. IMPRESSION: 1. No evidence of acute intracranial abnormality. 2. Tiny remote right cerebellar infarct. 3. Similar atrophy and chronic microvascular disease. 4. Similar 16 mm lobular focus of calcification along the posterior aspect of the left falx, likely representing a meningioma. Electronically Signed   By: Margaretha Sheffield M.D.   On: 01/26/2021 17:27   CT Head Wo Contrast  Result Date: 01/13/2021 CLINICAL DATA:  Head trauma EXAM: CT HEAD WITHOUT CONTRAST TECHNIQUE: Contiguous axial images were obtained from the base of the skull through the vertex without intravenous contrast. COMPARISON:  CT brain 01/04/2021, 03/02/2020, 02/23/2020 FINDINGS: Brain: No acute territorial infarction, hemorrhage or intracranial mass is visualized. Atrophy and mild chronic small vessel ischemic changes of the white matter. Small chronic infarcts in the cerebellum. Stable left posterior parafalcine round calcification likely calcified meningioma. Stable ventricle size. Vascular: No hyperdense vessels.  Carotid vascular calcification Skull: No fracture. Stable 11 mm intra osseous ground-glass lesion within the left frontal bone, felt benign. Sinuses/Orbits: No acute finding. Other: None IMPRESSION: 1. No CT evidence for acute intracranial abnormality. 2. Atrophy and mild chronic small vessel  ischemic changes of the white matter Electronically Signed   By: Donavan Foil M.D.   On: 01/13/2021 16:12   CT HEAD WO CONTRAST (5MM)  Result Date: 01/04/2021 CLINICAL DATA:  [place] 2 days ago with trauma to the head and face. EXAM: CT HEAD WITHOUT CONTRAST TECHNIQUE: Contiguous axial images were obtained from the base of the skull through the vertex without intravenous contrast. COMPARISON:  03/09/2020 FINDINGS: Brain: Mild age related volume loss. No evidence of old or acute focal infarction, mass lesion, hemorrhage, hydrocephalus or extra-axial collection. Vascular: There is atherosclerotic calcification of the major vessels at the base of the brain. Skull: Negative Sinuses/Orbits: Clear/normal Other: None IMPRESSION: No acute or traumatic finding.  Mild age related volume loss. Electronically Signed   By: Nelson Chimes M.D.   On: 01/04/2021 14:44  DG Chest Port 1 View  Result Date: 01/13/2021 CLINICAL DATA:  Sepsis EXAM: PORTABLE CHEST 1 VIEW COMPARISON:  03/09/2020 FINDINGS: Single frontal view of the chest demonstrates a stable cardiac silhouette. Stable hiatal hernia and prominent epicardial fat. No acute airspace disease, effusion, or pneumothorax. No acute bony abnormalities. IMPRESSION: 1. Stable chest, no acute intrathoracic process. Electronically Signed   By: Randa Ngo M.D.   On: 01/13/2021 16:00   DG ABD ACUTE 2+V W 1V CHEST  Result Date: 01/19/2021 CLINICAL DATA:  Abdominal pain EXAM: DG ABDOMEN ACUTE WITH 1 VIEW CHEST COMPARISON:  CT abdomen/pelvis dated 01/05/2021 FINDINGS: Large hernia at the right lung base. Bilateral lower lobe atelectasis. Lungs are otherwise clear.  No pleural effusion or pneumothorax. The heart is normal in size. Nonobstructive bowel gas pattern. No evidence of free air under the diaphragm on the upright view. Lumbar spine fixation hardware with prior vertebral augmentation. IMPRESSION: Negative abdominal radiographs.  No acute cardiopulmonary disease.  Electronically Signed   By: Julian Hy M.D.   On: 01/19/2021 01:32   [place] LT UPPER EXTREM LTD SOFT TISSUE NON VASCULAR  Result Date: 01/18/2021 CLINICAL DATA:  Left arm swelling EXAM: ULTRASOUND LEFT UPPER EXTREMITY LIMITED TECHNIQUE: Ultrasound examination of the upper extremity soft tissues was performed in the area of clinical concern. COMPARISON:  None. FINDINGS: Dedicated grayscale and color Doppler sonography was performed within the left antecubital fossa in the area of focal swelling. No abnormal subcutaneous soft tissue mass, fluid collection, or calcification is seen within this region. IMPRESSION: No sonographic correlate for the patient's reported focal swelling Electronically Signed   By: Fidela Salisbury M.D.   On: 01/18/2021 19:39   CT Maxillofacial Wo Contrast  Result Date: 01/04/2021 CLINICAL DATA:  [place] 2 days ago with facial injury and bruising. EXAM: CT MAXILLOFACIAL WITHOUT CONTRAST TECHNIQUE: Multidetector CT imaging of the maxillofacial structures was performed. Multiplanar CT image reconstructions were also generated. COMPARISON:  None. FINDINGS: Osseous: No facial fracture or focal bone lesion. Orbits: No evidence of orbital injury. Sinuses: Sinuses are clear. No traumatic fluid or inflammatory changes. Soft tissues: No soft tissue finding of note by CT. Limited intracranial: Normal IMPRESSION: Negative CT scan of the face. No evidence of fracture or orbital injury. Electronically Signed   By: Nelson Chimes M.D.   On: 01/04/2021 14:43   US Abdomen Limited RUQ (LIVER/GB)  Result Date: 01/13/2021 CLINICAL DATA:  Right upper quadrant abdominal pain. EXAM: ULTRASOUND ABDOMEN LIMITED RIGHT UPPER QUADRANT COMPARISON:  Abdominal ultrasound and CT 08/31/2019 FINDINGS: Gallbladder: Physiologically distended. Layering intraluminal sludge. Gallstones on prior ultrasound are not well seen on the current exam. No gallbladder wall thickening. No sonographic Murphy sign noted by  sonographer. Common bile duct: Diameter: 6 mm. Liver: Heterogeneous increased parenchymal echogenicity. The liver is difficult to penetrate. Allowing for this, no focal lesion. Technically limited assessment due to altered mental status and difficulty with breath hold. Portal vein is patent on color Doppler imaging with normal direction of blood flow towards the liver. Other: No right upper quadrant ascites. IMPRESSION: 1. Gallbladder sludge. Gallstones on prior ultrasound are not well seen on the current exam. No sonographic findings of acute cholecystitis. 2. No biliary dilatation. 3. Suggestion of hepatic steatosis. Electronically Signed   By: Keith Rake M.D.   On: 01/13/2021 18:10       The results of significant diagnostics from this hospitalization (including imaging, microbiology, ancillary and laboratory) are listed below for reference.     Microbiology: Recent Results (  from the past 240 hour(s))  Resp Panel by RT-PCR (Flu A&B, Covid) Nasopharyngeal Swab     Status: None   Collection Time: 01/18/21  6:33 AM   Specimen: Nasopharyngeal Swab; Nasopharyngeal(NP) swabs in vial transport medium  Result Value Ref Range Status   SARS Coronavirus 2 by RT PCR NEGATIVE NEGATIVE Final    Comment: (NOTE) SARS-CoV-2 target nucleic acids are NOT DETECTED.  The SARS-CoV-2 RNA is generally detectable in upper respiratory specimens during the acute phase of infection. The lowest concentration of SARS-CoV-2 viral copies this assay can detect is 138 copies/mL. A negative result does not preclude SARS-Cov-2 infection and should not be used as the sole basis for treatment or other patient management decisions. A negative result may occur with  improper specimen collection/handling, submission of specimen other than nasopharyngeal swab, presence of viral mutation(s) within the areas targeted by this assay, and inadequate number of viral copies(<138 copies/mL). A negative result must be combined  with clinical observations, patient history, and epidemiological information. The expected result is Negative.  Fact Sheet for Patients:  EntrepreneurPulse.com.au  Fact Sheet for Healthcare Providers:  IncredibleEmployment.be  This test is no t yet approved or cleared by the [place] FDA and  has been authorized for detection and/or diagnosis of SARS-CoV-2 by FDA under an Emergency Use Authorization (EUA). This EUA will remain  in effect (meaning this test can be used) for the duration of the COVID-19 declaration under Section 564(b)(1) of the Act, 21 U.S.C.section 360bbb-3(b)(1), unless the authorization is terminated  or revoked sooner.       Influenza A by PCR NEGATIVE NEGATIVE Final   Influenza B by PCR NEGATIVE NEGATIVE Final    Comment: (NOTE) The Xpert Xpress SARS-CoV-2/FLU/RSV plus assay is intended as an aid in the diagnosis of influenza from Nasopharyngeal swab specimens and should not be used as a sole basis for treatment. Nasal washings and aspirates are unacceptable for Xpert Xpress SARS-CoV-2/FLU/RSV testing.  Fact Sheet for Patients: EntrepreneurPulse.com.au  Fact Sheet for Healthcare Providers: IncredibleEmployment.be  This test is not yet approved or cleared by the [place] FDA and has been authorized for detection and/or diagnosis of SARS-CoV-2 by FDA under an Emergency Use Authorization (EUA). This EUA will remain in effect (meaning this test can be used) for the duration of the COVID-19 declaration under Section 564(b)(1) of the Act, 21 U.S.C. section 360bbb-3(b)(1), unless the authorization is terminated or revoked.  Performed at [facility], [place] [address], [place], [place] [address]   Gastrointestinal Panel by PCR , Stool     Status: None   Collection Time: 01/18/21  1:09 PM   Specimen: Stool  Result Value Ref Range Status   Campylobacter species NOT DETECTED  NOT DETECTED Final   Plesimonas shigelloides NOT DETECTED NOT DETECTED Final   Salmonella species NOT DETECTED NOT DETECTED Final   Yersinia enterocolitica NOT DETECTED NOT DETECTED Final   Vibrio species NOT DETECTED NOT DETECTED Final   Vibrio cholerae NOT DETECTED NOT DETECTED Final   Enteroaggregative E coli (EAEC) NOT DETECTED NOT DETECTED Final   Enteropathogenic E coli (EPEC) NOT DETECTED NOT DETECTED Final   Enterotoxigenic E coli (ETEC) NOT DETECTED NOT DETECTED Final   Shiga like toxin producing E coli (STEC) NOT DETECTED NOT DETECTED Final   Shigella/Enteroinvasive E coli (EIEC) NOT DETECTED NOT DETECTED Final   Cryptosporidium NOT DETECTED NOT DETECTED Final   Cyclospora cayetanensis NOT DETECTED NOT DETECTED Final   Entamoeba histolytica NOT DETECTED NOT DETECTED  Final   Giardia lamblia NOT DETECTED NOT DETECTED Final   Adenovirus F40/41 NOT DETECTED NOT DETECTED Final   Astrovirus NOT DETECTED NOT DETECTED Final   Norovirus GI/GII NOT DETECTED NOT DETECTED Final   Rotavirus A NOT DETECTED NOT DETECTED Final   Sapovirus (I, II, IV, and V) NOT DETECTED NOT DETECTED Final    Comment: Performed at [facility], [address], [place], [place] [address]  C Difficile Quick Screen w PCR reflex     Status: None   Collection Time: 01/18/21  1:09 PM   Specimen: Stool  Result Value Ref Range Status   C Diff antigen NEGATIVE NEGATIVE Final   C Diff toxin NEGATIVE NEGATIVE Final   C Diff interpretation No C. difficile detected.  Final    Comment: Performed at [facility], [place] [address] [place], [place] [address]  Resp Panel by RT-PCR (Flu A&B, Covid) Nasopharyngeal Swab     Status: None   Collection Time: 01/20/21  2:32 PM   Specimen: Nasopharyngeal Swab; Nasopharyngeal(NP) swabs in vial transport medium  Result Value Ref Range Status   SARS Coronavirus 2 by RT PCR NEGATIVE NEGATIVE Final    Comment: (NOTE) SARS-CoV-2 target nucleic acids are NOT  DETECTED.  The SARS-CoV-2 RNA is generally detectable in upper respiratory specimens during the acute phase of infection. The lowest concentration of SARS-CoV-2 viral copies this assay can detect is 138 copies/mL. A negative result does not preclude SARS-Cov-2 infection and should not be used as the sole basis for treatment or other patient management decisions. A negative result may occur with  improper specimen collection/handling, submission of specimen other than nasopharyngeal swab, presence of viral mutation(s) within the areas targeted by this assay, and inadequate number of viral copies(<138 copies/mL). A negative result must be combined with clinical observations, patient history, and epidemiological information. The expected result is Negative.  Fact Sheet for Patients:  EntrepreneurPulse.com.au  Fact Sheet for Healthcare Providers:  IncredibleEmployment.be  This test is no t yet approved or cleared by the [place] FDA and  has been authorized for detection and/or diagnosis of SARS-CoV-2 by FDA under an Emergency Use Authorization (EUA). This EUA will remain  in effect (meaning this test can be used) for the duration of the COVID-19 declaration under Section 564(b)(1) of the Act, 21 U.S.C.section 360bbb-3(b)(1), unless the authorization is terminated  or revoked sooner.       Influenza A by PCR NEGATIVE NEGATIVE Final   Influenza B by PCR NEGATIVE NEGATIVE Final    Comment: (NOTE) The Xpert Xpress SARS-CoV-2/FLU/RSV plus assay is intended as an aid in the diagnosis of influenza from Nasopharyngeal swab specimens and should not be used as a sole basis for treatment. Nasal washings and aspirates are unacceptable for Xpert Xpress SARS-CoV-2/FLU/RSV testing.  Fact Sheet for Patients: EntrepreneurPulse.com.au  Fact Sheet for Healthcare Providers: IncredibleEmployment.be  This test is not yet  approved or cleared by the [place] FDA and has been authorized for detection and/or diagnosis of SARS-CoV-2 by FDA under an Emergency Use Authorization (EUA). This EUA will remain in effect (meaning this test can be used) for the duration of the COVID-19 declaration under Section 564(b)(1) of the Act, 21 U.S.C. section 360bbb-3(b)(1), unless the authorization is terminated or revoked.  Performed at [facility], [place] [address], [place], [place] [address]   Resp Panel by RT-PCR (Flu A&B, Covid) Nasopharyngeal Swab     Status: None   Collection Time: 01/24/21  2:07  PM   Specimen: Nasopharyngeal Swab; Nasopharyngeal(NP) swabs in vial transport medium  Result Value Ref Range Status   SARS Coronavirus 2 by RT PCR NEGATIVE NEGATIVE Final    Comment: (NOTE) SARS-CoV-2 target nucleic acids are NOT DETECTED.  The SARS-CoV-2 RNA is generally detectable in upper respiratory specimens during the acute phase of infection. The lowest concentration of SARS-CoV-2 viral copies this assay can detect is 138 copies/mL. A negative result does not preclude SARS-Cov-2 infection and should not be used as the sole basis for treatment or other patient management decisions. A negative result may occur with  improper specimen collection/handling, submission of specimen other than nasopharyngeal swab, presence of viral mutation(s) within the areas targeted by this assay, and inadequate number of viral copies(<138 copies/mL). A negative result must be combined with clinical observations, patient history, and epidemiological information. The expected result is Negative.  Fact Sheet for Patients:  EntrepreneurPulse.com.au  Fact Sheet for Healthcare Providers:  IncredibleEmployment.be  This test is no t yet approved or cleared by the [place] FDA and  has been authorized for detection and/or diagnosis of SARS-CoV-2 by FDA under an Emergency Use Authorization  (EUA). This EUA will remain  in effect (meaning this test can be used) for the duration of the COVID-19 declaration under Section 564(b)(1) of the Act, 21 U.S.C.section 360bbb-3(b)(1), unless the authorization is terminated  or revoked sooner.       Influenza A by PCR NEGATIVE NEGATIVE Final   Influenza B by PCR NEGATIVE NEGATIVE Final    Comment: (NOTE) The Xpert Xpress SARS-CoV-2/FLU/RSV plus assay is intended as an aid in the diagnosis of influenza from Nasopharyngeal swab specimens and should not be used as a sole basis for treatment. Nasal washings and aspirates are unacceptable for Xpert Xpress SARS-CoV-2/FLU/RSV testing.  Fact Sheet for Patients: EntrepreneurPulse.com.au  Fact Sheet for Healthcare Providers: IncredibleEmployment.be  This test is not yet approved or cleared by the [place] FDA and has been authorized for detection and/or diagnosis of SARS-CoV-2 by FDA under an Emergency Use Authorization (EUA). This EUA will remain in effect (meaning this test can be used) for the duration of the COVID-19 declaration under Section 564(b)(1) of the Act, 21 U.S.C. section 360bbb-3(b)(1), unless the authorization is terminated or revoked.  Performed at [facility], [place] [address] [place], [place] [address]      Labs:  CBC: Recent Labs  Lab 01/27/21 0122  WBC 6.8  HGB 11.6*  HCT 35.0*  MCV 93.3  PLT 310   BMP &GFR Recent Labs  Lab 01/21/21 0840 01/22/21 0928 01/24/21 0156 01/27/21 0122  NA 139 135 133* 135  K 3.6 3.5 3.9 3.7  CL 105 102 101 100  CO2 25 28 26 29   GLUCOSE 94 103* 83 99  BUN 11 12 12 11   CREATININE 0.83 0.82 0.83 0.80  CALCIUM 8.7* 8.6* 8.8* 9.1  MG  --   --   --  1.6*  PHOS  --   --   --  2.4*   Estimated Creatinine Clearance: 62.9 mL/min (by C-G formula based on SCr of 0.8 mg/dL). Liver & Pancreas: Recent Labs  Lab 01/27/21 0122  ALBUMIN 2.7*   No results for input(s): LIPASE,  AMYLASE in the last 168 hours. No results for input(s): AMMONIA in the last 168 hours. Diabetic: No results for input(s): HGBA1C in the last 72 hours. No results for input(s): GLUCAP in the last 168 hours. Cardiac Enzymes: No results for input(s): CKTOTAL, CKMB,  CKMBINDEX, TROPONINI in the last 168 hours. No results for input(s): PROBNP in the last 8760 hours. Coagulation Profile: No results for input(s): INR, PROTIME in the last 168 hours. Thyroid Function Tests: No results for input(s): TSH, T4TOTAL, FREET4, T3FREE, THYROIDAB in the last 72 hours. Lipid Profile: No results for input(s): CHOL, HDL, LDLCALC, TRIG, CHOLHDL, LDLDIRECT in the last 72 hours. Anemia Panel: No results for input(s): VITAMINB12, FOLATE, FERRITIN, TIBC, IRON, RETICCTPCT in the last 72 hours. Urine analysis:    Component Value Date/Time   COLORURINE AMBER (A) 01/13/2021 1729   APPEARANCEUR HAZY (A) 01/13/2021 1729   LABSPEC 1.015 01/13/2021 1729   PHURINE 5.0 01/13/2021 1729   GLUCOSEU NEGATIVE 01/13/2021 1729   HGBUR NEGATIVE 01/13/2021 1729   BILIRUBINUR NEGATIVE 01/13/2021 1729   KETONESUR NEGATIVE 01/13/2021 1729   PROTEINUR NEGATIVE 01/13/2021 1729   UROBILINOGEN 0.2 11/12/2013 2233   NITRITE NEGATIVE 01/13/2021 1729   LEUKOCYTESUR NEGATIVE 01/13/2021 1729   Sepsis Labs: Invalid input(s): PROCALCITONIN, LACTICIDVEN   Time coordinating discharge: 45 minutes  SIGNED:  Mercy Riding, MD  [facility] 01/27/2021, 4:22 PM

## 2021-01-27 NOTE — Progress Notes (Signed)
[name] to be D/C'd per MD order. Discussed with the patient and all questions fully answered. ? VSS, Skin clean, dry and intact without evidence of skin break down, no evidence of skin tears noted. ? IV catheter discontinued intact. Site without signs and symptoms of complications. Dressing and pressure applied. ? An After Visit Summary was printed and given to the patient. Patient informed where to pickup prescriptions. ? D/c education completed with patient/family including follow up instructions, medication list, d/c activities limitations if indicated, with other d/c instructions as indicated by MD - patient able to verbalize understanding, all questions fully answered.  ? Patient instructed to return to ED, call 911, or call MD for any changes in condition.  ? Patient to be escorted via [place], and D/C home via private auto.

## 2021-02-20 ENCOUNTER — Other Ambulatory Visit (HOSPITAL_COMMUNITY): Payer: Self-pay

## 2021-02-24 DIAGNOSIS — Z79899 Other long term (current) drug therapy: Secondary | ICD-10-CM | POA: Diagnosis not present

## 2021-02-24 DIAGNOSIS — G894 Chronic pain syndrome: Secondary | ICD-10-CM | POA: Diagnosis not present

## 2021-02-24 DIAGNOSIS — M47816 Spondylosis without myelopathy or radiculopathy, lumbar region: Secondary | ICD-10-CM | POA: Diagnosis not present

## 2021-02-24 DIAGNOSIS — R030 Elevated blood-pressure reading, without diagnosis of hypertension: Secondary | ICD-10-CM | POA: Diagnosis not present

## 2021-02-24 DIAGNOSIS — F1120 Opioid dependence, uncomplicated: Secondary | ICD-10-CM | POA: Diagnosis not present

## 2021-03-02 DIAGNOSIS — R059 Cough, unspecified: Secondary | ICD-10-CM | POA: Diagnosis not present

## 2021-03-02 DIAGNOSIS — J019 Acute sinusitis, unspecified: Secondary | ICD-10-CM | POA: Diagnosis not present

## 2021-03-02 DIAGNOSIS — R062 Wheezing: Secondary | ICD-10-CM | POA: Diagnosis not present

## 2021-03-27 DIAGNOSIS — M47816 Spondylosis without myelopathy or radiculopathy, lumbar region: Secondary | ICD-10-CM | POA: Diagnosis not present

## 2021-03-27 DIAGNOSIS — F1120 Opioid dependence, uncomplicated: Secondary | ICD-10-CM | POA: Diagnosis not present

## 2021-03-27 DIAGNOSIS — Z79899 Other long term (current) drug therapy: Secondary | ICD-10-CM | POA: Diagnosis not present

## 2021-03-27 DIAGNOSIS — R030 Elevated blood-pressure reading, without diagnosis of hypertension: Secondary | ICD-10-CM | POA: Diagnosis not present

## 2021-03-27 DIAGNOSIS — M545 Low back pain, unspecified: Secondary | ICD-10-CM | POA: Diagnosis not present

## 2021-03-27 DIAGNOSIS — G894 Chronic pain syndrome: Secondary | ICD-10-CM | POA: Diagnosis not present

## 2021-04-05 DIAGNOSIS — E039 Hypothyroidism, unspecified: Secondary | ICD-10-CM | POA: Diagnosis not present

## 2021-04-05 DIAGNOSIS — J441 Chronic obstructive pulmonary disease with (acute) exacerbation: Secondary | ICD-10-CM | POA: Diagnosis not present

## 2021-04-05 DIAGNOSIS — I10 Essential (primary) hypertension: Secondary | ICD-10-CM | POA: Diagnosis not present

## 2021-04-05 DIAGNOSIS — J4540 Moderate persistent asthma, uncomplicated: Secondary | ICD-10-CM | POA: Diagnosis not present

## 2021-04-05 DIAGNOSIS — M810 Age-related osteoporosis without current pathological fracture: Secondary | ICD-10-CM | POA: Diagnosis not present

## 2021-04-05 DIAGNOSIS — K219 Gastro-esophageal reflux disease without esophagitis: Secondary | ICD-10-CM | POA: Diagnosis not present

## 2021-04-05 DIAGNOSIS — J42 Unspecified chronic bronchitis: Secondary | ICD-10-CM | POA: Diagnosis not present

## 2021-04-05 DIAGNOSIS — E7800 Pure hypercholesterolemia, unspecified: Secondary | ICD-10-CM | POA: Diagnosis not present

## 2021-04-14 ENCOUNTER — Other Ambulatory Visit (HOSPITAL_COMMUNITY): Payer: Self-pay

## 2021-04-19 ENCOUNTER — Other Ambulatory Visit (HOSPITAL_COMMUNITY): Payer: Self-pay

## 2021-04-27 DIAGNOSIS — M47816 Spondylosis without myelopathy or radiculopathy, lumbar region: Secondary | ICD-10-CM | POA: Diagnosis not present

## 2021-04-27 DIAGNOSIS — R030 Elevated blood-pressure reading, without diagnosis of hypertension: Secondary | ICD-10-CM | POA: Diagnosis not present

## 2021-04-27 DIAGNOSIS — M545 Low back pain, unspecified: Secondary | ICD-10-CM | POA: Diagnosis not present

## 2021-04-27 DIAGNOSIS — G894 Chronic pain syndrome: Secondary | ICD-10-CM | POA: Diagnosis not present

## 2021-04-27 DIAGNOSIS — Z79899 Other long term (current) drug therapy: Secondary | ICD-10-CM | POA: Diagnosis not present

## 2021-04-27 DIAGNOSIS — F1120 Opioid dependence, uncomplicated: Secondary | ICD-10-CM | POA: Diagnosis not present

## 2021-04-27 DIAGNOSIS — Z0000 Encounter for general adult medical examination without abnormal findings: Secondary | ICD-10-CM | POA: Diagnosis not present

## 2021-05-25 DIAGNOSIS — R030 Elevated blood-pressure reading, without diagnosis of hypertension: Secondary | ICD-10-CM | POA: Diagnosis not present

## 2021-05-25 DIAGNOSIS — M545 Low back pain, unspecified: Secondary | ICD-10-CM | POA: Diagnosis not present

## 2021-05-25 DIAGNOSIS — G894 Chronic pain syndrome: Secondary | ICD-10-CM | POA: Diagnosis not present

## 2021-05-25 DIAGNOSIS — M47816 Spondylosis without myelopathy or radiculopathy, lumbar region: Secondary | ICD-10-CM | POA: Diagnosis not present

## 2021-05-25 DIAGNOSIS — F1120 Opioid dependence, uncomplicated: Secondary | ICD-10-CM | POA: Diagnosis not present

## 2021-05-25 DIAGNOSIS — Z79899 Other long term (current) drug therapy: Secondary | ICD-10-CM | POA: Diagnosis not present

## 2021-06-07 DIAGNOSIS — J4521 Mild intermittent asthma with (acute) exacerbation: Secondary | ICD-10-CM | POA: Diagnosis not present

## 2021-06-07 DIAGNOSIS — K219 Gastro-esophageal reflux disease without esophagitis: Secondary | ICD-10-CM | POA: Diagnosis not present

## 2021-06-07 DIAGNOSIS — E039 Hypothyroidism, unspecified: Secondary | ICD-10-CM | POA: Diagnosis not present

## 2021-06-07 DIAGNOSIS — J441 Chronic obstructive pulmonary disease with (acute) exacerbation: Secondary | ICD-10-CM | POA: Diagnosis not present

## 2021-06-07 DIAGNOSIS — E7800 Pure hypercholesterolemia, unspecified: Secondary | ICD-10-CM | POA: Diagnosis not present

## 2021-06-16 ENCOUNTER — Emergency Department (HOSPITAL_BASED_OUTPATIENT_CLINIC_OR_DEPARTMENT_OTHER)
Admission: EM | Admit: 2021-06-16 | Discharge: 2021-06-17 | Disposition: A | Discharge status: Home / Self Care | Payer: Medicare HMO | Attending: Emergency Medicine | Admitting: Emergency Medicine

## 2021-06-16 ENCOUNTER — Encounter (HOSPITAL_BASED_OUTPATIENT_CLINIC_OR_DEPARTMENT_OTHER): Payer: Self-pay | Admitting: Emergency Medicine

## 2021-06-16 ENCOUNTER — Other Ambulatory Visit: Payer: Self-pay

## 2021-06-16 DIAGNOSIS — J45909 Unspecified asthma, uncomplicated: Secondary | ICD-10-CM | POA: Diagnosis not present

## 2021-06-16 DIAGNOSIS — K529 Noninfective gastroenteritis and colitis, unspecified: Secondary | ICD-10-CM | POA: Diagnosis not present

## 2021-06-16 DIAGNOSIS — Z96642 Presence of left artificial hip joint: Secondary | ICD-10-CM | POA: Insufficient documentation

## 2021-06-16 DIAGNOSIS — E039 Hypothyroidism, unspecified: Secondary | ICD-10-CM | POA: Diagnosis not present

## 2021-06-16 DIAGNOSIS — R1084 Generalized abdominal pain: Secondary | ICD-10-CM | POA: Diagnosis not present

## 2021-06-16 DIAGNOSIS — R112 Nausea with vomiting, unspecified: Secondary | ICD-10-CM | POA: Diagnosis not present

## 2021-06-16 DIAGNOSIS — Z79899 Other long term (current) drug therapy: Secondary | ICD-10-CM | POA: Diagnosis not present

## 2021-06-16 DIAGNOSIS — E876 Hypokalemia: Secondary | ICD-10-CM | POA: Diagnosis not present

## 2021-06-16 DIAGNOSIS — M549 Dorsalgia, unspecified: Secondary | ICD-10-CM | POA: Diagnosis not present

## 2021-06-16 DIAGNOSIS — R000 Tachycardia, unspecified: Secondary | ICD-10-CM | POA: Insufficient documentation

## 2021-06-16 DIAGNOSIS — I10 Essential (primary) hypertension: Secondary | ICD-10-CM | POA: Diagnosis not present

## 2021-06-16 DIAGNOSIS — R1111 Vomiting without nausea: Secondary | ICD-10-CM | POA: Diagnosis not present

## 2021-06-16 LAB — CBC WITH DIFFERENTIAL/PLATELET
Abs Immature Granulocytes: 0.06 10*3/uL (ref 0.00–0.07)
Basophils Absolute: 0 10*3/uL (ref 0.0–0.1)
Basophils Relative: 0 %
Eosinophils Absolute: 0 10*3/uL (ref 0.0–0.5)
Eosinophils Relative: 0 %
HCT: 36 % (ref 36.0–46.0)
Hemoglobin: 11.2 g/dL — ABNORMAL LOW (ref 12.0–15.0)
Immature Granulocytes: 1 %
Lymphocytes Relative: 12 %
Lymphs Abs: 1.6 10*3/uL (ref 0.7–4.0)
MCH: 25.5 pg — ABNORMAL LOW (ref 26.0–34.0)
MCHC: 31.1 g/dL (ref 30.0–36.0)
MCV: 81.8 fL (ref 80.0–100.0)
Monocytes Absolute: 1.3 10*3/uL — ABNORMAL HIGH (ref 0.1–1.0)
Monocytes Relative: 10 %
Neutro Abs: 10.1 10*3/uL — ABNORMAL HIGH (ref 1.7–7.7)
Neutrophils Relative %: 77 %
Platelets: 571 10*3/uL — ABNORMAL HIGH (ref 150–400)
RBC: 4.4 MIL/uL (ref 3.87–5.11)
RDW: 13.6 % (ref 11.5–15.5)
WBC: 13.1 10*3/uL — ABNORMAL HIGH (ref 4.0–10.5)
nRBC: 0 % (ref 0.0–0.2)

## 2021-06-16 MED ORDER — ONDANSETRON HCL 4 MG/2ML IJ SOLN
4.0000 mg | Freq: Once | INTRAMUSCULAR | Status: AC
Start: 2021-06-16 — End: 2021-06-16
  Administered 2021-06-16: 4 mg via INTRAVENOUS
  Filled 2021-06-16: qty 2

## 2021-06-16 MED ORDER — SODIUM CHLORIDE 0.9 % IV BOLUS
1000.0000 mL | Freq: Once | INTRAVENOUS | Status: AC
  Administered 2021-06-16: 1000 mL via INTRAVENOUS

## 2021-06-16 NOTE — ED Triage Notes (Signed)
N/V/D and generalized abd pain x 2 days.  Patient reports decreased urine output but hasn't been able to eat or drink in 2 days.  Patient denies blood in stool or vomit.  Negative for dizziness, negative for orthostatic changes per EMS. ?

## 2021-06-16 NOTE — ED Provider Notes (Addendum)
? ?[facility] ?Provider Note: Georgena Spurling, MD, FACEP ? ?CSN: [id number] ?MRN: [id number] ?ARRIVAL: 06/16/21 at 2322 ?ROOM: DB010/DB010 ? ? ?CHIEF COMPLAINT  ?N/V/D ? ? ?HISTORY OF PRESENT ILLNESS  ?06/16/21 11:30 PM ?Victoria Levine is a 71 y.o. female with 2 days of nausea, vomiting and diarrhea.  She has not been able to keep anything on her stomach.  Consequently her mouth is dry and her urine output has been decreased.  She denies abdominal pain with this although she is having some abdominal wall soreness from vomiting which she rates as a 6 out of 10.  Her abdomen is not tender to palpation.  She denies fever or lightheadedness.  She was noted to be tachycardic on arrival.  She denies hematemesis or blood in her stool. ? ? ?Past Medical History:  ?Diagnosis Date  ? Anemia   ? low iron  ? Anxiety   ? Asthma   ? Bipolar disorder ([place])   ? Chronic back pain   ? Compression fracture of L2 lumbar vertebra (HCC)   ? DDD (degenerative disc disease)   ? Depression   ? Dyspnea   ? GERD (gastroesophageal reflux disease)   ? H/O alcohol abuse   ? 28 years sober  ? H/O suicide attempt   ? Herpes   ? History of hiatal hernia   ? Hypothyroidism   ? Migraines   ? Osteoporosis 11/2018  ? T score of -2.6  ? Pneumonia   ? PONV (postoperative nausea and vomiting)   ? S/P adenoidectomy   ? S/P tonsillectomy   ? ? ?Past Surgical History:  ?Procedure Laterality Date  ? CESAREAN SECTION    ? COLONOSCOPY    ? as a teenager  ? Compression fracture spine    ? DILATION AND CURETTAGE OF UTERUS    ? EYE SURGERY Bilateral   ? CATARACTS REMOVED, NO LENS PLACED  ? HYSTEROSCOPY    ? KYPHOPLASTY N/A 12/19/2016  ? Procedure: THORACIC 12 KYPHOPLASTY; REQUEST 60 MINS AND FLIP ROOM;  Surgeon: Phylliss Bob, MD;  Location: [facility];  Service: Orthopedics;  Laterality: N/A;  THORACIC 12 KYPHOPLASTY; REQUEST 60 MINS AND FLIP ROOM  ? KYPHOPLASTY N/A 12/11/2017  ? Procedure: LUMBAR 2 KYPHOPLASTY;  Surgeon: Phylliss Bob, MD;  Location: [facility];   Service: Orthopedics;  Laterality: N/A;  ? LUMBAR FUSION    ? TONSILLECTOMY    ? TOTAL HIP ARTHROPLASTY Left 02/24/2020  ? Procedure: TOTAL HIP ARTHROPLASTY ANTERIOR APPROACH;  Surgeon: Rod Can, MD;  Location: [facility];  Service: Orthopedics;  Laterality: Left;  ? ? ?Family History  ?Adopted: Yes  ? ? ?Social History  ? ?Tobacco Use  ? Smoking status: Never  ? Smokeless tobacco: Never  ?Vaping Use  ? Vaping Use: Never used  ?Substance Use Topics  ? Alcohol use: Yes  ?  Alcohol/week: 0.0 standard drinks  ? Drug use: Not Currently  ?  Types: Benzodiazepines, Cocaine  ? ? ?Prior to Admission medications   ?Medication Sig Start Date End Date Taking? Authorizing Provider  ?ondansetron (ZOFRAN) 8 MG tablet Take 1 tablet (8 mg total) by mouth every 8 (eight) hours as needed for nausea or vomiting. 06/17/21  Yes Janaria Mccammon, MD  ?albuterol (PROVENTIL HFA;VENTOLIN HFA) 108 (90 Base) MCG/ACT inhaler Inhale 2 puffs into the lungs every 6 (six) hours as needed for wheezing or shortness of breath.    [provider]  ?albuterol (PROVENTIL) (2.5 MG/3ML) 0.083% nebulizer solution Take 2.5 mg by  nebulization every 6 (six) hours as needed for wheezing or shortness of breath.    [provider]  ?buPROPion (WELLBUTRIN XL) 150 MG 24 hr tablet Take 150 mg by mouth 2 (two) times daily. 10/01/16   [provider]  ?EPINEPHrine 0.3 mg/0.3 mL IJ SOAJ injection Inject 0.3 mg into the skin daily as needed for anaphylaxis.  04/10/19   [provider]  ?famotidine (PEPCID) 20 MG tablet Take 1 tablet (20 mg total) by mouth at bedtime. 09/03/19 03/18/21  Damita Lack, MD  ?FLUoxetine (PROZAC) 20 MG capsule Take 20 mg by mouth 2 (two) times daily.     [provider]  ?gabapentin (NEURONTIN) 600 MG tablet Take 600 mg by mouth 3 (three) times daily as needed (pain). 01/09/21   [provider]  ?levothyroxine (SYNTHROID) 125 MCG tablet Take 1 tablet (125 mcg total) by mouth daily.  01/27/21 01/27/22  Mercy Riding, MD  ?loperamide (IMODIUM) 2 MG capsule Take 2 capsules (4 mg total) by mouth as needed for diarrhea or loose stools. 01/17/21   Amin, Jeanella Flattery, MD  ?Multiple Vitamin (MULTIVITAMIN WITH MINERALS) TABS tablet Take 1 tablet by mouth daily.    [provider]  ?oxcarbazepine (TRILEPTAL) 600 MG tablet Take 600 mg by mouth 2 (two) times daily.    [provider]  ?oxyCODONE ER (XTAMPZA ER) 9 MG C12A Take 9 mg by mouth 2 (two) times daily.    [provider]  ?Oxycodone HCl 10 MG TABS Take 1 tablet (10 mg total) by mouth every 4 (four) hours as needed (for breakthrough pain). ?Patient taking differently: Take 10 mg by mouth 2 (two) times daily as needed (pain). 02/26/20   Dorothyann Peng, PA-C  ?QUEtiapine (SEROQUEL) 400 MG tablet Take 400 mg by mouth every evening. 11/12/20   [provider]  ?rizatriptan (MAXALT) 10 MG tablet Take 1 tablet (10 mg total) by mouth daily as needed for migraine. 01/27/21   Mercy Riding, MD  ? ? ?Allergies ?Prednisone, Lithium carbonate, Other, and Sulfa antibiotics ? ? ?REVIEW OF SYSTEMS  ?Negative except as noted here or in the History of Present Illness. ? ? ?PHYSICAL EXAMINATION  ?Initial Vital Signs ?Blood pressure (!) 168/106, pulse (!) 104, temperature 98.3 ?F (36.8 ?C), temperature source Oral, resp. rate 19, height '5\' 5"'$  (1.651 m), weight 68 kg, SpO2 96 %. ? ?Examination ?General: Well-developed, well-nourished female in no acute distress; appearance consistent with age of record ?HENT: normocephalic; atraumatic ?Eyes: pupils equal, round and reactive to light; extraocular muscles intact; bilateral pseudophakia ?Neck: supple ?Heart: regular rate and rhythm; tachycardia ?Lungs: clear to auscultation bilaterally ?Abdomen: soft; nondistended; nontender; bowel sounds present ?Extremities: No deformity; full range of motion; pulses normal ?Neurologic: Awake, alert and oriented; motor function intact in all  extremities and symmetric; no facial droop ?Skin: Warm and dry ?Psychiatric: Normal mood and affect ? ? ?RESULTS  ?Summary of this visit's results, reviewed and interpreted by myself: ? ? EKG Interpretation ? ?Date/Time:    ?Ventricular Rate:    ?PR Interval:    ?QRS Duration:   ?QT Interval:    ?QTC Calculation:   ?R Axis:     ?Text Interpretation:   ?  ? ?  ? ?Laboratory Studies: ?Results for orders placed or performed during the hospital encounter of 06/16/21 (from the past 24 hour(s))  ?CBC with Differential/Platelet     Status: Abnormal  ? Collection Time: 06/16/21 11:30 PM  ?Result Value Ref Range  ?  WBC 13.1 (H) 4.0 - 10.5 K/uL  ? RBC 4.40 3.87 - 5.11 MIL/uL  ? Hemoglobin 11.2 (L) 12.0 - 15.0 g/dL  ? HCT 36.0 36.0 - 46.0 %  ? MCV 81.8 80.0 - 100.0 fL  ? MCH 25.5 (L) 26.0 - 34.0 pg  ? MCHC 31.1 30.0 - 36.0 g/dL  ? RDW 13.6 11.5 - 15.5 %  ? Platelets 571 (H) 150 - 400 K/uL  ? nRBC 0.0 0.0 - 0.2 %  ? Neutrophils Relative % 77 %  ? Neutro Abs 10.1 (H) 1.7 - 7.7 K/uL  ? Lymphocytes Relative 12 %  ? Lymphs Abs 1.6 0.7 - 4.0 K/uL  ? Monocytes Relative 10 %  ? Monocytes Absolute 1.3 (H) 0.1 - 1.0 K/uL  ? Eosinophils Relative 0 %  ? Eosinophils Absolute 0.0 0.0 - 0.5 K/uL  ? Basophils Relative 0 %  ? Basophils Absolute 0.0 0.0 - 0.1 K/uL  ? Immature Granulocytes 1 %  ? Abs Immature Granulocytes 0.06 0.00 - 0.07 K/uL  ?Basic metabolic panel     Status: Abnormal  ? Collection Time: 06/16/21 11:30 PM  ?Result Value Ref Range  ? Sodium 132 (L) 135 - 145 mmol/L  ? Potassium 3.2 (L) 3.5 - 5.1 mmol/L  ? Chloride 96 (L) 98 - 111 mmol/L  ? CO2 21 (L) 22 - 32 mmol/L  ? Glucose, Bld 156 (H) 70 - 99 mg/dL  ? BUN 20 8 - 23 mg/dL  ? Creatinine, Ser 0.95 0.44 - 1.00 mg/dL  ? Calcium 10.3 8.9 - 10.3 mg/dL  ? GFR, Estimated >60 >60 mL/min  ? Anion gap 15 5 - 15  ?Urinalysis, Routine w reflex microscopic     Status: Abnormal  ? Collection Time: 06/17/21 12:43 AM  ?Result Value Ref Range  ? Color, Urine YELLOW YELLOW  ? APPearance CLEAR  CLEAR  ? Specific Gravity, Urine 1.013 1.005 - 1.030  ? pH 6.5 5.0 - 8.0  ? Glucose, UA NEGATIVE NEGATIVE mg/dL  ? Hgb urine dipstick SMALL (A) NEGATIVE  ? Bilirubin Urine NEGATIVE NEGATIVE  ? Ketones, ur 40 (A) NE

## 2021-06-17 LAB — URINALYSIS, ROUTINE W REFLEX MICROSCOPIC
Bilirubin Urine: NEGATIVE
Cellular Cast, UA: 4
Glucose, UA: NEGATIVE mg/dL
Ketones, ur: 40 mg/dL — AB
Leukocytes,Ua: NEGATIVE
Nitrite: NEGATIVE
Protein, ur: >300 mg/dL — AB
Specific Gravity, Urine: 1.013 (ref 1.005–1.030)
Trans Epithel, UA: <1
pH: 6.5 (ref 5.0–8.0)

## 2021-06-17 LAB — BASIC METABOLIC PANEL
Anion gap: 15 (ref 5–15)
BUN: 20 mg/dL (ref 8–23)
CO2: 21 mmol/L — ABNORMAL LOW (ref 22–32)
Calcium: 10.3 mg/dL (ref 8.9–10.3)
Chloride: 96 mmol/L — ABNORMAL LOW (ref 98–111)
Creatinine, Ser: 0.95 mg/dL (ref 0.44–1.00)
GFR, Estimated: >60 mL/min (ref >60)
Glucose, Bld: 156 mg/dL — ABNORMAL HIGH (ref 70–99)
Potassium: 3.2 mmol/L — ABNORMAL LOW (ref 3.5–5.1)
Sodium: 132 mmol/L — ABNORMAL LOW (ref 135–145)

## 2021-06-17 MED ORDER — METOCLOPRAMIDE HCL 5 MG/ML IJ SOLN
10.0000 mg | Freq: Once | INTRAMUSCULAR | Status: AC
Start: 2021-06-17 — End: 2021-06-17
  Administered 2021-06-17: 10 mg via INTRAVENOUS
  Filled 2021-06-17: qty 2

## 2021-06-17 MED ORDER — LORAZEPAM 2 MG/ML IJ SOLN
1.0000 mg | Freq: Once | INTRAMUSCULAR | Status: AC
Start: 2021-06-17 — End: 2021-06-17
  Administered 2021-06-17: 1 mg via INTRAVENOUS
  Filled 2021-06-17: qty 1

## 2021-06-17 MED ORDER — ONDANSETRON HCL 4 MG/2ML IJ SOLN
4.0000 mg | Freq: Once | INTRAMUSCULAR | Status: AC
  Administered 2021-06-17: 4 mg via INTRAVENOUS
  Filled 2021-06-17: qty 2

## 2021-06-17 MED ORDER — SODIUM CHLORIDE 0.9 % IV BOLUS
1000.0000 mL | Freq: Once | INTRAVENOUS | Status: AC
  Administered 2021-06-17: 1000 mL via INTRAVENOUS

## 2021-06-17 MED ORDER — ONDANSETRON HCL 8 MG PO TABS: 8.0000 mg | ORAL_TABLET | Freq: Three times a day (TID) | ORAL | 0 refills | Status: DC | PRN

## 2021-06-17 MED ORDER — POTASSIUM CHLORIDE CRYS ER 20 MEQ PO TBCR
40.0000 meq | EXTENDED_RELEASE_TABLET | Freq: Once | ORAL | Status: DC
  Filled 2021-06-17: qty 2

## 2021-06-22 DIAGNOSIS — G894 Chronic pain syndrome: Secondary | ICD-10-CM | POA: Diagnosis not present

## 2021-06-22 DIAGNOSIS — M545 Low back pain, unspecified: Secondary | ICD-10-CM | POA: Diagnosis not present

## 2021-06-22 DIAGNOSIS — Z79899 Other long term (current) drug therapy: Secondary | ICD-10-CM | POA: Diagnosis not present

## 2021-06-22 DIAGNOSIS — R030 Elevated blood-pressure reading, without diagnosis of hypertension: Secondary | ICD-10-CM | POA: Diagnosis not present

## 2021-06-22 DIAGNOSIS — M47816 Spondylosis without myelopathy or radiculopathy, lumbar region: Secondary | ICD-10-CM | POA: Diagnosis not present

## 2021-06-22 DIAGNOSIS — F1120 Opioid dependence, uncomplicated: Secondary | ICD-10-CM | POA: Diagnosis not present

## 2021-07-25 DIAGNOSIS — G894 Chronic pain syndrome: Secondary | ICD-10-CM | POA: Diagnosis not present

## 2021-07-25 DIAGNOSIS — M545 Low back pain, unspecified: Secondary | ICD-10-CM | POA: Diagnosis not present

## 2021-07-25 DIAGNOSIS — Z79899 Other long term (current) drug therapy: Secondary | ICD-10-CM | POA: Diagnosis not present

## 2021-07-25 DIAGNOSIS — F1120 Opioid dependence, uncomplicated: Secondary | ICD-10-CM | POA: Diagnosis not present

## 2021-07-25 DIAGNOSIS — R030 Elevated blood-pressure reading, without diagnosis of hypertension: Secondary | ICD-10-CM | POA: Diagnosis not present

## 2021-07-25 DIAGNOSIS — M47816 Spondylosis without myelopathy or radiculopathy, lumbar region: Secondary | ICD-10-CM | POA: Diagnosis not present

## 2021-07-26 DIAGNOSIS — M810 Age-related osteoporosis without current pathological fracture: Secondary | ICD-10-CM | POA: Diagnosis not present

## 2021-07-26 DIAGNOSIS — E039 Hypothyroidism, unspecified: Secondary | ICD-10-CM | POA: Diagnosis not present

## 2021-07-26 DIAGNOSIS — J441 Chronic obstructive pulmonary disease with (acute) exacerbation: Secondary | ICD-10-CM | POA: Diagnosis not present

## 2021-07-26 DIAGNOSIS — K219 Gastro-esophageal reflux disease without esophagitis: Secondary | ICD-10-CM | POA: Diagnosis not present

## 2021-07-26 DIAGNOSIS — E7800 Pure hypercholesterolemia, unspecified: Secondary | ICD-10-CM | POA: Diagnosis not present

## 2021-07-26 DIAGNOSIS — J4521 Mild intermittent asthma with (acute) exacerbation: Secondary | ICD-10-CM | POA: Diagnosis not present

## 2021-07-27 DIAGNOSIS — F4481 Dissociative identity disorder: Secondary | ICD-10-CM | POA: Diagnosis not present

## 2021-07-27 DIAGNOSIS — F319 Bipolar disorder, unspecified: Secondary | ICD-10-CM | POA: Diagnosis not present

## 2021-07-27 DIAGNOSIS — F4310 Post-traumatic stress disorder, unspecified: Secondary | ICD-10-CM | POA: Diagnosis not present

## 2021-08-24 DIAGNOSIS — R030 Elevated blood-pressure reading, without diagnosis of hypertension: Secondary | ICD-10-CM | POA: Diagnosis not present

## 2021-08-24 DIAGNOSIS — M47816 Spondylosis without myelopathy or radiculopathy, lumbar region: Secondary | ICD-10-CM | POA: Diagnosis not present

## 2021-08-24 DIAGNOSIS — Z79899 Other long term (current) drug therapy: Secondary | ICD-10-CM | POA: Diagnosis not present

## 2021-08-24 DIAGNOSIS — G894 Chronic pain syndrome: Secondary | ICD-10-CM | POA: Diagnosis not present

## 2021-08-24 DIAGNOSIS — F1120 Opioid dependence, uncomplicated: Secondary | ICD-10-CM | POA: Diagnosis not present

## 2021-08-24 DIAGNOSIS — M545 Low back pain, unspecified: Secondary | ICD-10-CM | POA: Diagnosis not present

## 2021-09-25 DIAGNOSIS — Z79899 Other long term (current) drug therapy: Secondary | ICD-10-CM | POA: Diagnosis not present

## 2021-09-25 DIAGNOSIS — E559 Vitamin D deficiency, unspecified: Secondary | ICD-10-CM | POA: Diagnosis not present

## 2021-09-25 DIAGNOSIS — M47816 Spondylosis without myelopathy or radiculopathy, lumbar region: Secondary | ICD-10-CM | POA: Diagnosis not present

## 2021-09-25 DIAGNOSIS — G894 Chronic pain syndrome: Secondary | ICD-10-CM | POA: Diagnosis not present

## 2021-09-25 DIAGNOSIS — M545 Low back pain, unspecified: Secondary | ICD-10-CM | POA: Diagnosis not present

## 2021-09-25 DIAGNOSIS — F1120 Opioid dependence, uncomplicated: Secondary | ICD-10-CM | POA: Diagnosis not present

## 2021-09-25 DIAGNOSIS — Z9181 History of falling: Secondary | ICD-10-CM | POA: Diagnosis not present

## 2021-09-25 DIAGNOSIS — R030 Elevated blood-pressure reading, without diagnosis of hypertension: Secondary | ICD-10-CM | POA: Diagnosis not present

## 2021-09-25 DIAGNOSIS — Z6827 Body mass index (BMI) 27.0-27.9, adult: Secondary | ICD-10-CM | POA: Diagnosis not present

## 2021-09-27 DIAGNOSIS — E039 Hypothyroidism, unspecified: Secondary | ICD-10-CM | POA: Diagnosis not present

## 2021-09-27 DIAGNOSIS — D649 Anemia, unspecified: Secondary | ICD-10-CM | POA: Diagnosis not present

## 2021-09-27 DIAGNOSIS — E559 Vitamin D deficiency, unspecified: Secondary | ICD-10-CM | POA: Diagnosis not present

## 2021-09-27 DIAGNOSIS — Z79899 Other long term (current) drug therapy: Secondary | ICD-10-CM | POA: Diagnosis not present

## 2021-09-27 DIAGNOSIS — I10 Essential (primary) hypertension: Secondary | ICD-10-CM | POA: Diagnosis not present

## 2021-10-05 DIAGNOSIS — M810 Age-related osteoporosis without current pathological fracture: Secondary | ICD-10-CM | POA: Diagnosis not present

## 2021-10-05 DIAGNOSIS — Z0000 Encounter for general adult medical examination without abnormal findings: Secondary | ICD-10-CM | POA: Diagnosis not present

## 2021-10-05 DIAGNOSIS — I10 Essential (primary) hypertension: Secondary | ICD-10-CM | POA: Diagnosis not present

## 2021-10-05 DIAGNOSIS — E039 Hypothyroidism, unspecified: Secondary | ICD-10-CM | POA: Diagnosis not present

## 2021-10-05 DIAGNOSIS — D62 Acute posthemorrhagic anemia: Secondary | ICD-10-CM | POA: Diagnosis not present

## 2021-10-05 DIAGNOSIS — D649 Anemia, unspecified: Secondary | ICD-10-CM | POA: Diagnosis not present

## 2021-10-05 DIAGNOSIS — I700 Atherosclerosis of aorta: Secondary | ICD-10-CM | POA: Diagnosis not present

## 2021-10-05 DIAGNOSIS — E559 Vitamin D deficiency, unspecified: Secondary | ICD-10-CM | POA: Diagnosis not present

## 2021-10-05 DIAGNOSIS — Z79899 Other long term (current) drug therapy: Secondary | ICD-10-CM | POA: Diagnosis not present

## 2021-10-12 DIAGNOSIS — D649 Anemia, unspecified: Secondary | ICD-10-CM | POA: Diagnosis not present

## 2021-10-18 ENCOUNTER — Other Ambulatory Visit: Payer: Self-pay

## 2021-10-18 ENCOUNTER — Emergency Department (HOSPITAL_COMMUNITY)
Admission: EM | Admit: 2021-10-18 | Discharge: 2021-10-19 | Disposition: A | Discharge status: Home / Self Care | Payer: Medicare HMO | Attending: Emergency Medicine | Admitting: Emergency Medicine

## 2021-10-18 ENCOUNTER — Encounter (HOSPITAL_COMMUNITY): Payer: Self-pay

## 2021-10-18 DIAGNOSIS — Z79899 Other long term (current) drug therapy: Secondary | ICD-10-CM | POA: Diagnosis not present

## 2021-10-18 DIAGNOSIS — J45909 Unspecified asthma, uncomplicated: Secondary | ICD-10-CM | POA: Diagnosis not present

## 2021-10-18 DIAGNOSIS — R1084 Generalized abdominal pain: Secondary | ICD-10-CM | POA: Insufficient documentation

## 2021-10-18 DIAGNOSIS — I10 Essential (primary) hypertension: Secondary | ICD-10-CM | POA: Diagnosis not present

## 2021-10-18 DIAGNOSIS — E039 Hypothyroidism, unspecified: Secondary | ICD-10-CM | POA: Diagnosis not present

## 2021-10-18 DIAGNOSIS — F419 Anxiety disorder, unspecified: Secondary | ICD-10-CM | POA: Insufficient documentation

## 2021-10-18 DIAGNOSIS — D72829 Elevated white blood cell count, unspecified: Secondary | ICD-10-CM | POA: Diagnosis not present

## 2021-10-18 DIAGNOSIS — R000 Tachycardia, unspecified: Secondary | ICD-10-CM | POA: Insufficient documentation

## 2021-10-18 DIAGNOSIS — I700 Atherosclerosis of aorta: Secondary | ICD-10-CM | POA: Diagnosis not present

## 2021-10-18 DIAGNOSIS — R112 Nausea with vomiting, unspecified: Secondary | ICD-10-CM | POA: Diagnosis not present

## 2021-10-18 DIAGNOSIS — R100 Acute abdomen: Secondary | ICD-10-CM | POA: Diagnosis not present

## 2021-10-18 DIAGNOSIS — Z7951 Long term (current) use of inhaled steroids: Secondary | ICD-10-CM | POA: Diagnosis not present

## 2021-10-18 LAB — CBC WITH DIFFERENTIAL/PLATELET
Abs Immature Granulocytes: 0.05 10*3/uL (ref 0.00–0.07)
Basophils Absolute: 0 10*3/uL (ref 0.0–0.1)
Basophils Relative: 0 %
Eosinophils Absolute: 0 10*3/uL (ref 0.0–0.5)
Eosinophils Relative: 0 %
HCT: 41.8 % (ref 36.0–46.0)
Hemoglobin: 12.6 g/dL (ref 12.0–15.0)
Immature Granulocytes: 0 %
Lymphocytes Relative: 11 %
Lymphs Abs: 1.4 10*3/uL (ref 0.7–4.0)
MCH: 25.3 pg — ABNORMAL LOW (ref 26.0–34.0)
MCHC: 30.1 g/dL (ref 30.0–36.0)
MCV: 83.9 fL (ref 80.0–100.0)
Monocytes Absolute: 1 10*3/uL (ref 0.1–1.0)
Monocytes Relative: 8 %
Neutro Abs: 9.7 10*3/uL — ABNORMAL HIGH (ref 1.7–7.7)
Neutrophils Relative %: 81 %
Platelets: 557 10*3/uL — ABNORMAL HIGH (ref 150–400)
RBC: 4.98 MIL/uL (ref 3.87–5.11)
WBC: 12.2 10*3/uL — ABNORMAL HIGH (ref 4.0–10.5)
nRBC: 0 % (ref 0.0–0.2)

## 2021-10-18 LAB — COMPREHENSIVE METABOLIC PANEL
ALT: 14 U/L (ref 0–44)
AST: 19 U/L (ref 15–41)
Albumin: 4.3 g/dL (ref 3.5–5.0)
Alkaline Phosphatase: 68 U/L (ref 38–126)
Anion gap: 14 (ref 5–15)
BUN: 22 mg/dL (ref 8–23)
CO2: 21 mmol/L — ABNORMAL LOW (ref 22–32)
Calcium: 10.3 mg/dL (ref 8.9–10.3)
Chloride: 98 mmol/L (ref 98–111)
Creatinine, Ser: 0.9 mg/dL (ref 0.44–1.00)
GFR, Estimated: >60 mL/min (ref >60)
Glucose, Bld: 126 mg/dL — ABNORMAL HIGH (ref 70–99)
Potassium: 3.6 mmol/L (ref 3.5–5.1)
Sodium: 133 mmol/L — ABNORMAL LOW (ref 135–145)
Total Bilirubin: 0.7 mg/dL (ref 0.3–1.2)
Total Protein: 7.2 g/dL (ref 6.5–8.1)

## 2021-10-18 LAB — LIPASE, BLOOD: Lipase: 59 U/L — ABNORMAL HIGH (ref 11–51)

## 2021-10-18 MED ORDER — HYDROMORPHONE HCL 1 MG/ML IJ SOLN
0.5000 mg | Freq: Once | INTRAMUSCULAR | Status: AC
  Administered 2021-10-18: 0.5 mg via INTRAVENOUS
  Filled 2021-10-18: qty 1

## 2021-10-18 MED ORDER — ONDANSETRON HCL 4 MG/2ML IJ SOLN
4.0000 mg | Freq: Once | INTRAMUSCULAR | Status: AC
  Administered 2021-10-18: 4 mg via INTRAVENOUS
  Filled 2021-10-18: qty 2

## 2021-10-18 MED ORDER — SODIUM CHLORIDE 0.9 % IV BOLUS
500.0000 mL | Freq: Once | INTRAVENOUS | Status: AC
  Administered 2021-10-18: 500 mL via INTRAVENOUS

## 2021-10-18 MED ORDER — LORAZEPAM 2 MG/ML IJ SOLN
1.0000 mg | Freq: Once | INTRAMUSCULAR | Status: AC
  Administered 2021-10-18: 1 mg via INTRAVENOUS
  Filled 2021-10-18: qty 1

## 2021-10-18 MED ORDER — SODIUM CHLORIDE 0.9 % IV SOLN: INTRAVENOUS | Status: DC

## 2021-10-18 NOTE — ED Provider Notes (Signed)
[facility] DEPT Provider Note   CSN: [id number] Arrival date & time: 10/18/21  2112     History  Chief Complaint  Patient presents with   Abdominal Pain    Victoria Levine is a 71 y.o. female.  Patient with onset of abdominal pain crampy in nature nausea and vomiting started last night.  Patient brought in by EMS.  Patient has poor oral intake and has not been eating or drinking.  They gave her Zofran and 100 mL normal saline.  Patient's oxygen sats on arrival 98%.  Temp 99.4 no true fever but a little elevated heart rate 110.  Blood pressure 146/94.  Patient states that some of the times the symptoms are due to her being very anxious.  Past medical history significant for history of bipolar disorder history of asthma history of chronic back pain history of alcohol abuse but has been sober for 28 years.  History of hypothyroidism.  Past surgical history significant for C-section lumbar fusion tonsillectomy kyphoplasty and total hip arthroplasty on the left.  Patient never smoked.       Home Medications Prior to Admission medications   Medication Sig Start Date End Date Taking? Authorizing Provider  albuterol (PROVENTIL HFA;VENTOLIN HFA) 108 (90 Base) MCG/ACT inhaler Inhale 2 puffs into the lungs every 6 (six) hours as needed for wheezing or shortness of breath.    [provider]  albuterol (PROVENTIL) (2.5 MG/3ML) 0.083% nebulizer solution Take 2.5 mg by nebulization every 6 (six) hours as needed for wheezing or shortness of breath.    [provider]  buPROPion (WELLBUTRIN XL) 150 MG 24 hr tablet Take 150 mg by mouth 2 (two) times daily. 10/01/16   [provider]  EPINEPHrine 0.3 mg/0.3 mL IJ SOAJ injection Inject 0.3 mg into the skin daily as needed for anaphylaxis.  04/10/19   [provider]  famotidine (PEPCID) 20 MG tablet Take 1 tablet (20 mg total) by mouth at bedtime. 09/03/19 03/18/21  Amin, Jeanella Flattery, MD   FLUoxetine (PROZAC) 20 MG capsule Take 20 mg by mouth 2 (two) times daily.     [provider]  gabapentin (NEURONTIN) 600 MG tablet Take 600 mg by mouth 3 (three) times daily as needed (pain). 01/09/21   [provider]  levothyroxine (SYNTHROID) 125 MCG tablet Take 1 tablet (125 mcg total) by mouth daily. 01/27/21 01/27/22  Mercy Riding, MD  loperamide (IMODIUM) 2 MG capsule Take 2 capsules (4 mg total) by mouth as needed for diarrhea or loose stools. 01/17/21   Amin, Jeanella Flattery, MD  Multiple Vitamin (MULTIVITAMIN WITH MINERALS) TABS tablet Take 1 tablet by mouth daily.    [provider]  ondansetron (ZOFRAN) 8 MG tablet Take 1 tablet (8 mg total) by mouth every 8 (eight) hours as needed for nausea or vomiting. 06/17/21   Molpus, John, MD  oxcarbazepine (TRILEPTAL) 600 MG tablet Take 600 mg by mouth 2 (two) times daily.    [provider]  oxyCODONE ER (XTAMPZA ER) 9 MG C12A Take 9 mg by mouth 2 (two) times daily.    [provider]  Oxycodone HCl 10 MG TABS Take 1 tablet (10 mg total) by mouth every 4 (four) hours as needed (for breakthrough pain). Patient taking differently: Take 10 mg by mouth 2 (two) times daily as needed (pain). 02/26/20   Dorothyann Peng, PA-C  QUEtiapine (SEROQUEL) 400 MG tablet Take 400 mg by mouth every evening. 11/12/20   [provider]  rizatriptan (MAXALT) 10 MG tablet Take 1 tablet (10 mg total) by mouth daily as needed for migraine. 01/27/21   Mercy Riding, MD      Allergies    Prednisone, Lithium carbonate, Other, and Sulfa antibiotics    Review of Systems   Review of Systems  Constitutional:  Negative for chills and fever.  HENT:  Negative for ear pain and sore throat.   Eyes:  Negative for pain and visual disturbance.  Respiratory:  Negative for cough and shortness of breath.   Cardiovascular:  Negative for chest pain and palpitations.  Gastrointestinal:  Positive for abdominal pain, nausea and  vomiting.  Genitourinary:  Negative for dysuria and hematuria.  Musculoskeletal:  Negative for arthralgias and back pain.  Skin:  Negative for color change and rash.  Neurological:  Negative for seizures and syncope.  Psychiatric/Behavioral:  The patient is nervous/anxious.   All other systems reviewed and are negative.   Physical Exam Updated Vital Signs BP (!) 151/106   Pulse (!) 106   Temp 99.4 F (37.4 C)   Resp 18   SpO2 97%  Physical Exam Vitals and nursing note reviewed.  Constitutional:      General: She is not in acute distress.    Appearance: She is well-developed.  HENT:     Head: Normocephalic and atraumatic.  Eyes:     Extraocular Movements: Extraocular movements intact.     Conjunctiva/sclera: Conjunctivae normal.     Pupils: Pupils are equal, round, and reactive to light.  Cardiovascular:     Rate and Rhythm: Normal rate and regular rhythm.     Heart sounds: No murmur heard. Pulmonary:     Effort: Pulmonary effort is normal. No respiratory distress.     Breath sounds: Normal breath sounds. No wheezing, rhonchi or rales.  Abdominal:     Palpations: Abdomen is soft.     Tenderness: There is no abdominal tenderness. There is no guarding.  Musculoskeletal:        General: No swelling.     Cervical back: Normal range of motion and neck supple. No rigidity.  Skin:    General: Skin is warm and dry.     Capillary Refill: Capillary refill takes less than 2 seconds.  Neurological:     General: No focal deficit present.     Mental Status: She is alert and oriented to person, place, and time.  Psychiatric:        Mood and Affect: Mood normal.     ED Results / Procedures / Treatments   Labs (all labs ordered are listed, but only abnormal results are displayed) Labs Reviewed  CBC WITH DIFFERENTIAL/PLATELET - Abnormal; Notable for the following components:      Result Value   WBC 12.2 (*)    MCH 25.3 (*)    Platelets 557 (*)    Neutro Abs 9.7 (*)    All  other components within normal limits  URINALYSIS, ROUTINE W REFLEX MICROSCOPIC  COMPREHENSIVE METABOLIC PANEL  LIPASE, BLOOD    EKG EKG Interpretation  Date/Time:  Wednesday October 18 2021 21:24:18 EDT Ventricular Rate:  102 PR Interval:  140 QRS Duration: 87 QT Interval:  333 QTC Calculation: 434 R Axis:   119 Text Interpretation: Sinus tachycardia Probable left atrial enlargement Right axis deviation Abnormal R-wave progression, late transition Nonspecific T abnormalities, anterior leads No significant change since last tracing Confirmed by Fredia Sorrow [phone]) on 10/18/2021 10:18:24 PM  Radiology No results found.  Procedures Procedures    Medications Ordered in ED Medications  0.9 %  sodium chloride infusion ( Intravenous New Bag/Given 10/18/21 2241)  LORazepam (ATIVAN) injection 1 mg (1 mg Intravenous Given 10/18/21 2245)  ondansetron (ZOFRAN) injection 4 mg (4 mg Intravenous Given 10/18/21 2243)  HYDROmorphone (DILAUDID) injection 0.5 mg (0.5 mg Intravenous Given 10/18/21 2247)  sodium chloride 0.9 % bolus 500 mL (0 mLs Intravenous Stopped 10/18/21 2321)    ED Course/ Medical Decision Making/ A&P                           Medical Decision Making Amount and/or Complexity of Data Reviewed Labs: ordered. Radiology: ordered.  Risk Prescription drug management.   Patient feels much better after some pain medicine antinausea medicine receiving some IV fluids.  In addition patient was given 1 mg of Ativan.  CBC white count is 12,000 hemoglobin good at 12.6.  Complete metabolic panel and lipase are still pending.  Patient will get CT scan of the abdomen to rule out bowel obstruction.  Patient has not had a CT scan recently.  But patient has been seen for similar symptoms in the past.  Patient was seen in March for nausea vomiting and diarrhea diagnosis gastroenteritis at [place].  And patient seen in November 22 for diarrhea and weakness known to be dehydration.  Patient  without any diarrhea with with today's presentation.   Final Clinical Impression(s) / ED Diagnoses Final diagnoses:  Generalized abdominal pain  Nausea and vomiting, unspecified vomiting type    Rx / DC Orders ED Discharge Orders     None         Fredia Sorrow, MD 10/18/21 2325

## 2021-10-18 NOTE — ED Triage Notes (Signed)
Pt. BIB [facility] c/o abdominal pain, N&V that started last night. Per EMS pt. Has had a poor oral intake and has not been eating or drinking. Given '4mg'$  zofran en route by EMS and 139m NS.  EMS VS: HR:110 BP: 160/100 O2:96% RA CBG: 131

## 2021-10-18 NOTE — ED Provider Notes (Signed)
  Provider Note MRN:  [id number]  Arrival date & time: 10/18/21    ED Course and Medical Decision Making  Assumed care from Dr. Rogene Houston at shift change.  Acute abdominal pain with nausea vomiting, doing much better at this time after medical intervention.  Abdomen soft and nontender, awaiting labs, CT.  Procedures  Final Clinical Impressions(s) / ED Diagnoses     ICD-10-CM   1. Generalized abdominal pain  R10.84     2. Nausea and vomiting, unspecified vomiting type  R11.2       ED Discharge Orders     None       Discharge Instructions   None     Barth Kirks. Sedonia Small, [place] [email or web address]

## 2021-10-19 ENCOUNTER — Emergency Department (HOSPITAL_COMMUNITY): Payer: Medicare HMO

## 2021-10-19 ENCOUNTER — Encounter (HOSPITAL_COMMUNITY): Payer: Self-pay

## 2021-10-19 DIAGNOSIS — I700 Atherosclerosis of aorta: Secondary | ICD-10-CM | POA: Diagnosis not present

## 2021-10-19 MED ORDER — ONDANSETRON HCL 4 MG/2ML IJ SOLN: 4.0000 mg | Freq: Once | INTRAMUSCULAR | Status: DC

## 2021-10-19 MED ORDER — ONDANSETRON 4 MG PO TBDP
4.0000 mg | ORAL_TABLET | Freq: Once | ORAL | Status: AC
  Administered 2021-10-19: 4 mg via ORAL
  Filled 2021-10-19: qty 1

## 2021-10-19 MED ORDER — IOHEXOL 300 MG/ML  SOLN
100.0000 mL | Freq: Once | INTRAMUSCULAR | Status: AC | PRN
  Administered 2021-10-19: 100 mL via INTRAVENOUS

## 2021-10-19 MED ORDER — SUCRALFATE 1 G PO TABS: 1.0000 g | ORAL_TABLET | Freq: Four times a day (QID) | ORAL | 0 refills | Status: AC | PRN

## 2021-10-19 MED ORDER — OMEPRAZOLE 20 MG PO CPDR: 20.0000 mg | DELAYED_RELEASE_CAPSULE | Freq: Every day | ORAL | 1 refills | Status: AC

## 2021-10-19 MED ORDER — SODIUM CHLORIDE (PF) 0.9 % IJ SOLN
INTRAMUSCULAR | Status: AC
  Filled 2021-10-19: qty 50

## 2021-10-19 NOTE — Discharge Instructions (Signed)
You were evaluated in the Emergency Department and after careful evaluation, we did not find any emergent condition requiring admission or further testing in the hospital.  Your exam/testing today was overall reassuring.  Symptoms seem to be due to acid reflux, possibly related to your hiatal hernia.  Recommend taking the omeprazole and Carafate as we discussed.  Please return to the Emergency Department if you experience any worsening of your condition.  Thank you for allowing [place] to be a part of your care.

## 2021-10-20 ENCOUNTER — Other Ambulatory Visit: Payer: Self-pay

## 2021-10-20 ENCOUNTER — Emergency Department (HOSPITAL_COMMUNITY)
Admission: EM | Admit: 2021-10-20 | Discharge: 2021-10-20 | Disposition: A | Discharge status: Home / Self Care | Payer: Medicare HMO | Attending: Emergency Medicine | Admitting: Emergency Medicine

## 2021-10-20 ENCOUNTER — Encounter (HOSPITAL_COMMUNITY): Payer: Self-pay

## 2021-10-20 ENCOUNTER — Emergency Department (HOSPITAL_COMMUNITY): Payer: Medicare HMO

## 2021-10-20 DIAGNOSIS — R1013 Epigastric pain: Secondary | ICD-10-CM | POA: Diagnosis not present

## 2021-10-20 DIAGNOSIS — R109 Unspecified abdominal pain: Secondary | ICD-10-CM | POA: Diagnosis not present

## 2021-10-20 DIAGNOSIS — R0602 Shortness of breath: Secondary | ICD-10-CM | POA: Insufficient documentation

## 2021-10-20 DIAGNOSIS — D649 Anemia, unspecified: Secondary | ICD-10-CM | POA: Diagnosis not present

## 2021-10-20 DIAGNOSIS — R1084 Generalized abdominal pain: Secondary | ICD-10-CM | POA: Diagnosis not present

## 2021-10-20 DIAGNOSIS — R079 Chest pain, unspecified: Secondary | ICD-10-CM | POA: Diagnosis not present

## 2021-10-20 DIAGNOSIS — K449 Diaphragmatic hernia without obstruction or gangrene: Secondary | ICD-10-CM | POA: Insufficient documentation

## 2021-10-20 DIAGNOSIS — R0600 Dyspnea, unspecified: Secondary | ICD-10-CM | POA: Diagnosis not present

## 2021-10-20 DIAGNOSIS — I700 Atherosclerosis of aorta: Secondary | ICD-10-CM | POA: Diagnosis not present

## 2021-10-20 DIAGNOSIS — R1110 Vomiting, unspecified: Secondary | ICD-10-CM | POA: Diagnosis not present

## 2021-10-20 DIAGNOSIS — I10 Essential (primary) hypertension: Secondary | ICD-10-CM | POA: Diagnosis not present

## 2021-10-20 DIAGNOSIS — J45909 Unspecified asthma, uncomplicated: Secondary | ICD-10-CM | POA: Insufficient documentation

## 2021-10-20 DIAGNOSIS — R1010 Upper abdominal pain, unspecified: Secondary | ICD-10-CM

## 2021-10-20 LAB — URINALYSIS, ROUTINE W REFLEX MICROSCOPIC
Bacteria, UA: NONE SEEN
Bilirubin Urine: NEGATIVE
Glucose, UA: NEGATIVE mg/dL
Ketones, ur: 5 mg/dL — AB
Nitrite: NEGATIVE
Protein, ur: 30 mg/dL — AB
Specific Gravity, Urine: 1.027 (ref 1.005–1.030)
pH: 5 (ref 5.0–8.0)

## 2021-10-20 LAB — COMPREHENSIVE METABOLIC PANEL
ALT: 12 U/L (ref 0–44)
AST: 15 U/L (ref 15–41)
Albumin: 4 g/dL (ref 3.5–5.0)
Alkaline Phosphatase: 59 U/L (ref 38–126)
Anion gap: 8 (ref 5–15)
BUN: 20 mg/dL (ref 8–23)
CO2: 26 mmol/L (ref 22–32)
Calcium: 10.1 mg/dL (ref 8.9–10.3)
Chloride: 101 mmol/L (ref 98–111)
Creatinine, Ser: 0.86 mg/dL (ref 0.44–1.00)
GFR, Estimated: >60 mL/min (ref >60)
Glucose, Bld: 114 mg/dL — ABNORMAL HIGH (ref 70–99)
Potassium: 3.2 mmol/L — ABNORMAL LOW (ref 3.5–5.1)
Sodium: 135 mmol/L (ref 135–145)
Total Bilirubin: 0.4 mg/dL (ref 0.3–1.2)
Total Protein: 6.8 g/dL (ref 6.5–8.1)

## 2021-10-20 LAB — CBC
HCT: 39.9 % (ref 36.0–46.0)
Hemoglobin: 11.9 g/dL — ABNORMAL LOW (ref 12.0–15.0)
MCH: 25.9 pg — ABNORMAL LOW (ref 26.0–34.0)
MCHC: 29.8 g/dL — ABNORMAL LOW (ref 30.0–36.0)
MCV: 86.9 fL (ref 80.0–100.0)
Platelets: 433 10*3/uL — ABNORMAL HIGH (ref 150–400)
RBC: 4.59 MIL/uL (ref 3.87–5.11)
WBC: 8.2 10*3/uL (ref 4.0–10.5)
nRBC: 0 % (ref 0.0–0.2)

## 2021-10-20 LAB — LIPASE, BLOOD: Lipase: 41 U/L (ref 11–51)

## 2021-10-20 LAB — TROPONIN I (HIGH SENSITIVITY)
Troponin I (High Sensitivity): 6 ng/L (ref <18)
Troponin I (High Sensitivity): 6 ng/L (ref <18)

## 2021-10-20 MED ORDER — SODIUM CHLORIDE 0.9 % IV BOLUS
500.0000 mL | Freq: Once | INTRAVENOUS | Status: AC
  Administered 2021-10-20: 500 mL via INTRAVENOUS

## 2021-10-20 MED ORDER — ALUM & MAG HYDROXIDE-SIMETH 200-200-20 MG/5ML PO SUSP
30.0000 mL | Freq: Once | ORAL | Status: AC
  Administered 2021-10-20: 30 mL via ORAL
  Filled 2021-10-20: qty 30

## 2021-10-20 MED ORDER — IOHEXOL 300 MG/ML  SOLN
100.0000 mL | Freq: Once | INTRAMUSCULAR | Status: AC | PRN
  Administered 2021-10-20: 100 mL via INTRAVENOUS

## 2021-10-20 MED ORDER — ONDANSETRON 4 MG PO TBDP: 4.0000 mg | ORAL_TABLET | Freq: Three times a day (TID) | ORAL | 0 refills | Status: AC | PRN

## 2021-10-20 MED ORDER — POTASSIUM CHLORIDE CRYS ER 20 MEQ PO TBCR
40.0000 meq | EXTENDED_RELEASE_TABLET | Freq: Once | ORAL | Status: AC
  Administered 2021-10-20: 40 meq via ORAL
  Filled 2021-10-20: qty 2

## 2021-10-20 MED ORDER — ONDANSETRON HCL 4 MG/2ML IJ SOLN
4.0000 mg | Freq: Once | INTRAMUSCULAR | Status: AC
  Administered 2021-10-20: 4 mg via INTRAVENOUS
  Filled 2021-10-20: qty 2

## 2021-10-20 MED ORDER — FAMOTIDINE 20 MG PO TABS: 20.0000 mg | ORAL_TABLET | Freq: Two times a day (BID) | ORAL | 0 refills | Status: AC

## 2021-10-20 MED ORDER — HYDROMORPHONE HCL 1 MG/ML IJ SOLN
0.5000 mg | Freq: Once | INTRAMUSCULAR | Status: AC
  Administered 2021-10-20: 0.5 mg via INTRAVENOUS
  Filled 2021-10-20: qty 1

## 2021-10-20 MED ORDER — OXYCODONE HCL 5 MG PO TABS
10.0000 mg | ORAL_TABLET | Freq: Once | ORAL | Status: AC
  Administered 2021-10-20: 10 mg via ORAL
  Filled 2021-10-20: qty 2

## 2021-10-20 NOTE — ED Provider Triage Note (Signed)
Emergency Medicine Provider Triage Evaluation Note  Victoria Levine , a 71 y.o. female  was evaluated in triage.  Pt complains of epigastric abdominal pain.  Evaluated for the same complaint 2 days ago.  Reports no improvement in symptoms since then.  Reports associated nausea and vomiting.  Pain radiates to her back.  Review of Systems  Positive: As above Negative: As above  Physical Exam  BP (!) 151/102   Pulse 91   Temp (!) 97.4 F (36.3 C) (Axillary)   Resp 16   Ht '5\' 6"'$  (1.676 m)   Wt 69.9 kg   SpO2 96%   BMI 24.86 kg/m  Gen:   Awake, no distress   Resp:  Normal effort MSK:   Moves extremities without difficulty  Other:  Abdominal tenderness present.  Without flank tenderness.  Medical Decision Making  Medically screening exam initiated at 6:12 PM.  Appropriate orders placed.  Victoria Levine was informed that the remainder of the evaluation will be completed by another provider, this initial triage assessment does not replace that evaluation, and the importance of remaining in the ED until their evaluation is complete.     Victoria Courier, PA-C 10/20/21 1813

## 2021-10-20 NOTE — ED Notes (Signed)
Pt is now complaining of ShOB.  Pt O2 currently 98%

## 2021-10-20 NOTE — Discharge Instructions (Addendum)
Increase your Prilosec/omeprazole from 20 mg to 40 mg/day.  Follow-up with general surgery for evaluation of your hiatal hernia.  If you develop worsening, continued, or recurrent abdominal pain, uncontrolled vomiting, fever, chest or back pain, or any other new/concerning symptoms then return to the ER for evaluation.

## 2021-10-20 NOTE — ED Triage Notes (Addendum)
PER EMS: pt is from home with c/o upper abd cramping and acid reflux x 3 days. She was seen here two days ago for same, diagnosed with acid reflux and discharged with Protonix but states it is not helping. BP- 182/90, HR-98, 99% RA

## 2021-10-20 NOTE — ED Provider Notes (Signed)
[facility] DEPT Provider Note   CSN: [id number] Arrival date & time: 10/20/21  1742     History  Chief Complaint  Patient presents with   Abdominal Pain    Victoria Levine is a 71 y.o. female.  HPI 71 year old female presents for continued abdominal pain.  She has been having pain for about 4 days.  It is primarily epigastric and at this point is a dull feeling.  She is also had vomiting.  She had a couple loose stools but otherwise no significant diarrhea.  No hematemesis.  No fevers.  She has been feeling short of breath over this time as well.  Has a history of asthma.  No chest pain.  The pain is currently severe.  She has not had alcohol in a couple days though she reports to me that she only drinks 1 glass per night.  She tried the medicines prescribed to her yesterday but it has not seemed to help.  Home Medications Prior to Admission medications   Medication Sig Start Date End Date Taking? Authorizing Provider  famotidine (PEPCID) 20 MG tablet Take 1 tablet (20 mg total) by mouth 2 (two) times daily. 10/20/21  Yes Sherwood Gambler, MD  ondansetron (ZOFRAN-ODT) 4 MG disintegrating tablet Take 1 tablet (4 mg total) by mouth every 8 (eight) hours as needed for nausea or vomiting. 10/20/21  Yes Sherwood Gambler, MD  albuterol (PROVENTIL HFA;VENTOLIN HFA) 108 (90 Base) MCG/ACT inhaler Inhale 2 puffs into the lungs every 6 (six) hours as needed for wheezing or shortness of breath.    [provider]  albuterol (PROVENTIL) (2.5 MG/3ML) 0.083% nebulizer solution Take 2.5 mg by nebulization every 6 (six) hours as needed for wheezing or shortness of breath. Patient not taking: Reported on 10/19/2021    [provider]  buPROPion (WELLBUTRIN XL) 150 MG 24 hr tablet Take 150 mg by mouth 2 (two) times daily. 10/01/16   [provider]  calcium carbonate (OS-CAL - DOSED IN MG OF ELEMENTAL CALCIUM) 1250 (500 Ca) MG tablet Take 1 tablet by mouth  daily with breakfast.    [provider]  cholecalciferol (VITAMIN D3) 25 MCG (1000 UNIT) tablet Take 1,000 Units by mouth daily.    [provider]  EPINEPHrine 0.3 mg/0.3 mL IJ SOAJ injection Inject 0.3 mg into the skin daily as needed for anaphylaxis.  Patient not taking: Reported on 10/19/2021 04/10/19   [provider]  ferrous sulfate 325 (65 FE) MG tablet Take 325 mg by mouth in the morning and at bedtime. 09/27/21   [provider]  FLUoxetine (PROZAC) 20 MG capsule Take 20 mg by mouth 2 (two) times daily.     [provider]  gabapentin (NEURONTIN) 600 MG tablet Take 600 mg by mouth 3 (three) times daily as needed (restless leg). 01/09/21   [provider]  levothyroxine (SYNTHROID) 125 MCG tablet Take 1 tablet (125 mcg total) by mouth daily. 01/27/21 01/27/22  Mercy Riding, MD  Multiple Vitamin (MULTIVITAMIN WITH MINERALS) TABS tablet Take 1 tablet by mouth daily.    [provider]  omeprazole (PRILOSEC) 20 MG capsule Take 1 capsule (20 mg total) by mouth daily. 10/19/21   Maudie Flakes, MD  oxcarbazepine (TRILEPTAL) 600 MG tablet Take 600 mg by mouth 2 (two) times daily.    [provider]  oxyCODONE ER (XTAMPZA ER) 9 MG C12A Take 9 mg by mouth 2 (two) times daily.    [provider]  Oxycodone HCl 10 MG TABS Take 1 tablet (10 mg total) by mouth every 4 (four) hours as needed (for breakthrough pain). Patient taking differently: Take 10 mg by mouth 2 (two) times daily as needed (for breakthrough pain). 02/26/20   Dorothyann Peng, PA-C  QUEtiapine (SEROQUEL) 400 MG tablet Take 200 mg by mouth every evening. 11/12/20   [provider]  rizatriptan (MAXALT) 10 MG tablet Take 1 tablet (10 mg total) by mouth daily as needed for migraine. Patient not taking: Reported on 10/19/2021 01/27/21   Mercy Riding, MD  sucralfate (CARAFATE) 1 g tablet Take 1 tablet (1 g total) by mouth 4 (four) times daily as needed.  10/19/21   Maudie Flakes, MD      Allergies    Prednisone, Lithium carbonate, Other, and Sulfa antibiotics    Review of Systems   Review of Systems  Constitutional:  Negative for fever.  Respiratory:  Positive for shortness of breath.   Cardiovascular:  Negative for chest pain.  Gastrointestinal:  Positive for abdominal pain, diarrhea and vomiting.    Physical Exam Updated Vital Signs BP (!) 144/97   Pulse 78   Temp (!) 97.5 F (36.4 C)   Resp 20   Ht '5\' 6"'$  (1.676 m)   Wt 69.9 kg   SpO2 96%   BMI 24.86 kg/m  Physical Exam Vitals and nursing note reviewed.  Constitutional:      Appearance: She is well-developed. She is not ill-appearing or diaphoretic.  HENT:     Head: Normocephalic and atraumatic.  Cardiovascular:     Rate and Rhythm: Normal rate and regular rhythm.     Heart sounds: Normal heart sounds.  Pulmonary:     Effort: Pulmonary effort is normal.     Breath sounds: Normal breath sounds. No wheezing.  Abdominal:     Palpations: Abdomen is soft.     Tenderness: There is abdominal tenderness in the right lower quadrant and epigastric area.  Skin:    General: Skin is warm and dry.  Neurological:     Mental Status: She is alert.     ED Results / Procedures / Treatments   Labs (all labs ordered are listed, but only abnormal results are displayed) Labs Reviewed  COMPREHENSIVE METABOLIC PANEL - Abnormal; Notable for the following components:      Result Value   Potassium 3.2 (*)    Glucose, Bld 114 (*)    All other components within normal limits  CBC - Abnormal; Notable for the following components:   Hemoglobin 11.9 (*)    MCH 25.9 (*)    MCHC 29.8 (*)    Platelets 433 (*)    All other components within normal limits  URINALYSIS, ROUTINE W REFLEX MICROSCOPIC - Abnormal; Notable for the following components:   Hgb urine dipstick SMALL (*)    Ketones, ur 5 (*)    Protein, ur 30 (*)    Leukocytes,Ua MODERATE (*)    All other components within normal  limits  LIPASE, BLOOD  TROPONIN I (HIGH SENSITIVITY)  TROPONIN I (HIGH SENSITIVITY)    EKG EKG Interpretation  Date/Time:  Friday October 20 2021 18:11:22 EDT Ventricular Rate:  88 PR Interval:  148 QRS Duration: 87 QT Interval:  348 QTC Calculation: 421 R Axis:   136 Text Interpretation: Sinus rhythm Right axis deviation Baseline wander in lead(s) V3 nonspecific T waves. overall similar to 2 days ago Confirmed by Sherwood Gambler [phone]) on 10/20/2021 8:51:19 PM  Radiology CT ABDOMEN PELVIS W CONTRAST  Result Date: 10/20/2021 CLINICAL DATA:  right lower quadrant abdominal pain. Epigastric pain. Nausea and vomiting EXAM: CT ABDOMEN AND PELVIS WITH CONTRAST TECHNIQUE: Multidetector CT imaging of the abdomen and pelvis was performed using the standard protocol following bolus administration of intravenous contrast. RADIATION DOSE REDUCTION: This exam was performed according to the departmental dose-optimization program which includes automated exposure control, adjustment of the mA and/or kV according to patient size and/or use of iterative reconstruction technique. CONTRAST:  165m OMNIPAQUE IOHEXOL 300 MG/ML  SOLN COMPARISON:  CT examination dated October 19, 2021 FINDINGS: Lower chest: Large fat containing hernia abutting the right heart border, unchanged. Moderate-sized hiatal hernia with left basilar atelectasis, unchanged. Hepatobiliary: No focal liver abnormality is seen. No gallstones, gallbladder wall thickening, or biliary dilatation. Pancreas: Unremarkable. No pancreatic ductal dilatation or surrounding inflammatory changes. Spleen: Normal in size without focal abnormality. Adrenals/Urinary Tract: Adrenal glands are unremarkable. Kidneys are normal, without renal calculi, focal lesion, or hydronephrosis. Bladder is unremarkable. Stomach/Bowel: Moderate-sized hiatal hernia. Stomach is within normal limits. Appendix appears normal. Sigmoid colonic diverticulosis without evidence of acute  diverticulitis. Vascular/Lymphatic: Aortic atherosclerosis. No enlarged abdominal or pelvic lymph nodes. Reproductive: Uterus and bilateral adnexa are unremarkable. Other: Fat containing umbilical hernia, evaluation of anterior abdominal wall is limited due to motion. Musculoskeletal: Status post left hip arthroplasty with intact hardware. Posterior spinal fusion at L3-L5. Compression deformities of T12 and L2 vertebral bodies with vertebroplasty, unchanged. No acute osseous abnormality. IMPRESSION: 1. Bowel loops are normal in caliber. No evidence of obstruction. Sigmoid colonic diverticulosis without evidence of acute diverticulitis. 2.  Normal appendix. 3.  Stable moderate hiatal hernia. 4. 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 5.  Additional chronic findings as above. Electronically Signed   By: IKeane PoliceD.O.   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 Additional moderate hiatal hernia. Stable cardiomediastinal silhouette. No focal consolidation, pleural effusion, or pneumothorax. No acute osseous abnormality. IMPRESSION: No active disease.  Unchanged Morgagni and hiatal hernias. Electronically Signed   By: TPlacido SouM.D.   On: 10/20/2021 21:39   CT Abdomen Pelvis W Contrast  Result Date: 10/19/2021 CLINICAL DATA:  Nausea and vomiting. Bowel obstruction suspected. Abdominal pain. EXAM: CT ABDOMEN AND PELVIS WITH CONTRAST TECHNIQUE: Multidetector CT imaging of the abdomen and pelvis was performed using the standard protocol following bolus administration of intravenous contrast. RADIATION DOSE REDUCTION: This exam was performed according to the departmental dose-optimization program which includes automated exposure control, adjustment of the mA and/or kV according to  patient size and/or use of iterative reconstruction technique. CONTRAST:  102mOMNIPAQUE IOHEXOL 300 MG/ML  SOLN COMPARISON:  01/13/2021. FINDINGS: Lower chest: Atelectasis is present at the lung bases. There is a large fat containing hernia anterior to the right heart border, unchanged from the prior exam. Hepatobiliary: No focal liver abnormality is seen. Mild hepatic steatosis. No gallstones, gallbladder wall thickening, or biliary dilatation. Pancreas: Unremarkable. No pancreatic ductal dilatation or surrounding inflammatory changes. Spleen: Normal in size without focal abnormality. Adrenals/Urinary Tract: There is thickening of the right adrenal gland with no evidence of discrete nodule. The left adrenal gland is within normal limits. The kidneys enhance symmetrically. A cyst is present in the upper pole the left kidney. No renal calculus or  hydronephrosis. The bladder is unremarkable. Stomach/Bowel: There is a large hiatal hernia, which is incompletely visualized on exam. No bowel obstruction, free air, or pneumatosis. Multiple scattered diverticula are present along the colon without evidence of diverticulitis. The appendix is prominent in size at 9 mm with no surrounding inflammatory changes. Vascular/Lymphatic: Aortic atherosclerosis. No enlarged abdominal or pelvic lymph nodes. Reproductive: Uterus and bilateral adnexa are unremarkable. Other: Small fat containing umbilical hernia. No abdominopelvic ascites. Musculoskeletal: Left hip arthroplasty changes are noted. There are compression deformities at T12 and L2 with kyphoplasty changes. Spinal fusion hardware is noted from L3-L5. Degenerative changes are noted in the thoracolumbar spine. No acute osseous abnormality. IMPRESSION: 1. No evidence of bowel obstruction. 2. Thickened appendix measuring 8 mm in diameter with no surrounding inflammatory changes. Findings are new from the previous exam. Clinical correlation is recommended to exclude early  appendicitis. 3. Moderate-to-large hiatal hernia, incompletely visualized on exam. Fat containing hernia in the anterior chest along the right heart border is unchanged. 4. Diverticulosis without diverticulitis. 5. Hepatic steatosis. 6. Left renal cyst. 7. Aortic atherosclerosis. Electronically Signed   By: Brett Fairy M.D.   On: 10/19/2021 01:46    Procedures Procedures    Medications Ordered in ED Medications  oxyCODONE (Oxy IR/ROXICODONE) immediate release tablet 10 mg (has no administration in time range)  HYDROmorphone (DILAUDID) injection 0.5 mg (0.5 mg Intravenous Given 10/20/21 2202)  sodium chloride 0.9 % bolus 500 mL (0 mLs Intravenous Stopped 10/20/21 2239)  ondansetron (ZOFRAN) injection 4 mg (4 mg Intravenous Given 10/20/21 2202)  iohexol (OMNIPAQUE) 300 MG/ML solution 100 mL (100 mLs Intravenous Contrast Given 10/20/21 2152)  alum & mag hydroxide-simeth (MAALOX/MYLANTA) 200-200-20 MG/5ML suspension 30 mL (30 mLs Oral Given 10/20/21 2238)  potassium chloride SA (KLOR-CON M) CR tablet 40 mEq (40 mEq Oral Given 10/20/21 2239)    ED Course/ Medical Decision Making/ A&P                           Medical Decision Making Amount and/or Complexity of Data Reviewed Labs: ordered.    Details: Troponins negative x2.  Lipase and LFTs unremarkable.  Mild hypokalemia Radiology: ordered and independent interpretation performed.    Details: No pneumonia on chest x-ray.  CT without obstruction or appendicitis.  Risk OTC drugs. Prescription drug management.   Patient presents a little over 24 hours after being in the emergency department for the same abdominal pain.  CT yesterday was concerning for a dilated appendix but no obvious appendicitis.  She has some very mild right lower quadrant pain so a repeat was obtained.  The CT is overall unremarkable besides her chronic hiatal hernia that is probably the cause of her symptoms today.  She was treated with IV Dilaudid, Zofran, fluids and GI cocktail.  Given potassium for hypokalemia.  She was able to tolerate p.o. and thus I think is stable for discharge.  She will be referred to general surgery for further evaluation of this hiatal hernia and possible surgical management.  Otherwise, she is chronically on oxycodone though states she has run out.  Given her consistent monthly prescriptions I do not think it is appropriate for me to give her further prescription and she will need to talk with her PCP.  We will give her a dose here but otherwise we will try to adjust her GI medicines and have her follow-up as an outpatient.  Given return precautions.  Final Clinical Impression(s) / ED Diagnoses Final diagnoses:  Upper abdominal pain  Hiatal hernia    Rx / DC Orders ED Discharge Orders          Ordered    ondansetron (ZOFRAN-ODT) 4 MG disintegrating tablet  Every 8 hours PRN        10/20/21 2312    famotidine (PEPCID) 20 MG tablet  2 times daily        10/20/21 2312              Sherwood Gambler, MD 10/20/21 2313

## 2021-10-20 NOTE — ED Notes (Signed)
Pt complaining of shob and not being able to breath.  Pt talking in full sentences. Will reassess vitals and make PA aware.

## 2021-10-24 DIAGNOSIS — Z79899 Other long term (current) drug therapy: Secondary | ICD-10-CM | POA: Diagnosis not present

## 2021-10-24 DIAGNOSIS — Z9181 History of falling: Secondary | ICD-10-CM | POA: Diagnosis not present

## 2021-10-24 DIAGNOSIS — F1120 Opioid dependence, uncomplicated: Secondary | ICD-10-CM | POA: Diagnosis not present

## 2021-10-24 DIAGNOSIS — G894 Chronic pain syndrome: Secondary | ICD-10-CM | POA: Diagnosis not present

## 2021-10-24 DIAGNOSIS — F4481 Dissociative identity disorder: Secondary | ICD-10-CM | POA: Diagnosis not present

## 2021-10-24 DIAGNOSIS — Z6827 Body mass index (BMI) 27.0-27.9, adult: Secondary | ICD-10-CM | POA: Diagnosis not present

## 2021-10-24 DIAGNOSIS — F319 Bipolar disorder, unspecified: Secondary | ICD-10-CM | POA: Diagnosis not present

## 2021-10-24 DIAGNOSIS — M545 Low back pain, unspecified: Secondary | ICD-10-CM | POA: Diagnosis not present

## 2021-10-24 DIAGNOSIS — R030 Elevated blood-pressure reading, without diagnosis of hypertension: Secondary | ICD-10-CM | POA: Diagnosis not present

## 2021-10-24 DIAGNOSIS — M47816 Spondylosis without myelopathy or radiculopathy, lumbar region: Secondary | ICD-10-CM | POA: Diagnosis not present

## 2021-10-24 DIAGNOSIS — D539 Nutritional anemia, unspecified: Secondary | ICD-10-CM | POA: Diagnosis not present

## 2021-10-24 DIAGNOSIS — F4310 Post-traumatic stress disorder, unspecified: Secondary | ICD-10-CM | POA: Diagnosis not present

## 2021-11-08 DIAGNOSIS — J441 Chronic obstructive pulmonary disease with (acute) exacerbation: Secondary | ICD-10-CM | POA: Diagnosis not present

## 2021-11-08 DIAGNOSIS — E039 Hypothyroidism, unspecified: Secondary | ICD-10-CM | POA: Diagnosis not present

## 2021-11-08 DIAGNOSIS — E7800 Pure hypercholesterolemia, unspecified: Secondary | ICD-10-CM | POA: Diagnosis not present

## 2021-11-08 DIAGNOSIS — J4540 Moderate persistent asthma, uncomplicated: Secondary | ICD-10-CM | POA: Diagnosis not present

## 2021-11-08 DIAGNOSIS — M810 Age-related osteoporosis without current pathological fracture: Secondary | ICD-10-CM | POA: Diagnosis not present

## 2021-11-08 DIAGNOSIS — K219 Gastro-esophageal reflux disease without esophagitis: Secondary | ICD-10-CM | POA: Diagnosis not present

## 2021-11-12 ENCOUNTER — Other Ambulatory Visit: Payer: Self-pay

## 2021-11-12 ENCOUNTER — Emergency Department (HOSPITAL_COMMUNITY)
Admission: EM | Admit: 2021-11-12 | Discharge: 2021-11-12 | Discharge status: Against Medical Advice | Payer: Medicare HMO | Attending: Emergency Medicine | Admitting: Emergency Medicine

## 2021-11-12 ENCOUNTER — Encounter (HOSPITAL_COMMUNITY): Payer: Self-pay

## 2021-11-12 DIAGNOSIS — J80 Acute respiratory distress syndrome: Secondary | ICD-10-CM | POA: Diagnosis not present

## 2021-11-12 DIAGNOSIS — Z5321 Procedure and treatment not carried out due to patient leaving prior to being seen by health care provider: Secondary | ICD-10-CM | POA: Insufficient documentation

## 2021-11-12 DIAGNOSIS — R1013 Epigastric pain: Secondary | ICD-10-CM | POA: Diagnosis not present

## 2021-11-12 DIAGNOSIS — K259 Gastric ulcer, unspecified as acute or chronic, without hemorrhage or perforation: Secondary | ICD-10-CM | POA: Diagnosis not present

## 2021-11-12 DIAGNOSIS — R42 Dizziness and giddiness: Secondary | ICD-10-CM | POA: Diagnosis not present

## 2021-11-12 DIAGNOSIS — I10 Essential (primary) hypertension: Secondary | ICD-10-CM | POA: Diagnosis not present

## 2021-11-12 LAB — COMPREHENSIVE METABOLIC PANEL
ALT: 12 U/L (ref 0–44)
AST: 18 U/L (ref 15–41)
Albumin: 4.2 g/dL (ref 3.5–5.0)
Alkaline Phosphatase: 63 U/L (ref 38–126)
Anion gap: 10 (ref 5–15)
BUN: 13 mg/dL (ref 8–23)
CO2: 23 mmol/L (ref 22–32)
Calcium: 9.8 mg/dL (ref 8.9–10.3)
Chloride: 103 mmol/L (ref 98–111)
Creatinine, Ser: 0.76 mg/dL (ref 0.44–1.00)
GFR, Estimated: >60 mL/min (ref >60)
Glucose, Bld: 121 mg/dL — ABNORMAL HIGH (ref 70–99)
Potassium: 3.9 mmol/L (ref 3.5–5.1)
Sodium: 136 mmol/L (ref 135–145)
Total Bilirubin: 0.8 mg/dL (ref 0.3–1.2)
Total Protein: 7.3 g/dL (ref 6.5–8.1)

## 2021-11-12 LAB — CBC
HCT: 39.4 % (ref 36.0–46.0)
Hemoglobin: 12.6 g/dL (ref 12.0–15.0)
MCH: 26.9 pg (ref 26.0–34.0)
MCHC: 32 g/dL (ref 30.0–36.0)
MCV: 84 fL (ref 80.0–100.0)
Platelets: 454 10*3/uL — ABNORMAL HIGH (ref 150–400)
RBC: 4.69 MIL/uL (ref 3.87–5.11)
RDW: 21.6 % — ABNORMAL HIGH (ref 11.5–15.5)
WBC: 9.4 10*3/uL (ref 4.0–10.5)
nRBC: 0 % (ref 0.0–0.2)

## 2021-11-12 LAB — LIPASE, BLOOD: Lipase: 37 U/L (ref 11–51)

## 2021-11-12 NOTE — ED Triage Notes (Signed)
BIB [facility] from home with c/o epigastric abd pain. Was well controlled at home with Protonix but ran out. Seen twice this month for same.   150 fent given by EMS.

## 2021-11-14 ENCOUNTER — Telehealth: Payer: Self-pay

## 2021-11-14 DIAGNOSIS — R1013 Epigastric pain: Secondary | ICD-10-CM | POA: Diagnosis not present

## 2021-11-14 DIAGNOSIS — Z1211 Encounter for screening for malignant neoplasm of colon: Secondary | ICD-10-CM | POA: Diagnosis not present

## 2021-11-14 NOTE — Telephone Encounter (Signed)
     Patient  visit on 8/27  at [facility] you been able to follow up with your primary care physician? Yes   The patient was or was not able to obtain any needed medicine or equipment.na  Are there diet recommendations that you are having difficulty following?na  Patient expresses understanding of discharge instructions and education provided has no other needs at this time. [place], [facility], Care Management  [phone] [address], [place], [place] [address] Phone: [phone] Email: [email or web address]

## 2021-11-15 DIAGNOSIS — I10 Essential (primary) hypertension: Secondary | ICD-10-CM | POA: Diagnosis not present

## 2021-11-15 DIAGNOSIS — D649 Anemia, unspecified: Secondary | ICD-10-CM | POA: Diagnosis not present

## 2021-11-23 DIAGNOSIS — Z6826 Body mass index (BMI) 26.0-26.9, adult: Secondary | ICD-10-CM | POA: Diagnosis not present

## 2021-11-23 DIAGNOSIS — M545 Low back pain, unspecified: Secondary | ICD-10-CM | POA: Diagnosis not present

## 2021-11-23 DIAGNOSIS — M47816 Spondylosis without myelopathy or radiculopathy, lumbar region: Secondary | ICD-10-CM | POA: Diagnosis not present

## 2021-11-23 DIAGNOSIS — G894 Chronic pain syndrome: Secondary | ICD-10-CM | POA: Diagnosis not present

## 2021-11-23 DIAGNOSIS — F1120 Opioid dependence, uncomplicated: Secondary | ICD-10-CM | POA: Diagnosis not present

## 2021-11-23 DIAGNOSIS — Z9181 History of falling: Secondary | ICD-10-CM | POA: Diagnosis not present

## 2021-11-23 DIAGNOSIS — R030 Elevated blood-pressure reading, without diagnosis of hypertension: Secondary | ICD-10-CM | POA: Diagnosis not present

## 2021-11-23 DIAGNOSIS — Z79899 Other long term (current) drug therapy: Secondary | ICD-10-CM | POA: Diagnosis not present

## 2021-11-26 ENCOUNTER — Emergency Department (HOSPITAL_COMMUNITY): Payer: Medicare HMO

## 2021-11-26 ENCOUNTER — Inpatient Hospital Stay (HOSPITAL_COMMUNITY): Payer: Medicare HMO

## 2021-11-26 ENCOUNTER — Other Ambulatory Visit: Payer: Self-pay

## 2021-11-26 ENCOUNTER — Inpatient Hospital Stay (HOSPITAL_COMMUNITY)
Admission: EM | Admit: 2021-11-26 | Discharge: 2021-12-01 | DRG: 521 | Disposition: A | Discharge status: Skilled Nursing Facility | Payer: Medicare HMO | Attending: Internal Medicine | Admitting: Internal Medicine

## 2021-11-26 ENCOUNTER — Encounter (HOSPITAL_COMMUNITY): Payer: Self-pay

## 2021-11-26 DIAGNOSIS — G894 Chronic pain syndrome: Secondary | ICD-10-CM | POA: Diagnosis not present

## 2021-11-26 DIAGNOSIS — F32A Depression, unspecified: Secondary | ICD-10-CM | POA: Diagnosis not present

## 2021-11-26 DIAGNOSIS — E871 Hypo-osmolality and hyponatremia: Secondary | ICD-10-CM

## 2021-11-26 DIAGNOSIS — S72001S Fracture of unspecified part of neck of right femur, sequela: Secondary | ICD-10-CM | POA: Diagnosis not present

## 2021-11-26 DIAGNOSIS — Z882 Allergy status to sulfonamides status: Secondary | ICD-10-CM | POA: Diagnosis not present

## 2021-11-26 DIAGNOSIS — E038 Other specified hypothyroidism: Secondary | ICD-10-CM

## 2021-11-26 DIAGNOSIS — Z8619 Personal history of other infectious and parasitic diseases: Secondary | ICD-10-CM | POA: Diagnosis not present

## 2021-11-26 DIAGNOSIS — R0902 Hypoxemia: Secondary | ICD-10-CM | POA: Diagnosis not present

## 2021-11-26 DIAGNOSIS — F418 Other specified anxiety disorders: Secondary | ICD-10-CM | POA: Diagnosis not present

## 2021-11-26 DIAGNOSIS — E039 Hypothyroidism, unspecified: Secondary | ICD-10-CM | POA: Diagnosis not present

## 2021-11-26 DIAGNOSIS — S72009A Fracture of unspecified part of neck of unspecified femur, initial encounter for closed fracture: Secondary | ICD-10-CM | POA: Diagnosis present

## 2021-11-26 DIAGNOSIS — Z887 Allergy status to serum and vaccine status: Secondary | ICD-10-CM | POA: Diagnosis not present

## 2021-11-26 DIAGNOSIS — Z9841 Cataract extraction status, right eye: Secondary | ICD-10-CM

## 2021-11-26 DIAGNOSIS — M4312 Spondylolisthesis, cervical region: Secondary | ICD-10-CM | POA: Diagnosis not present

## 2021-11-26 DIAGNOSIS — I10 Essential (primary) hypertension: Secondary | ICD-10-CM | POA: Diagnosis present

## 2021-11-26 DIAGNOSIS — Z9842 Cataract extraction status, left eye: Secondary | ICD-10-CM | POA: Diagnosis not present

## 2021-11-26 DIAGNOSIS — Z8701 Personal history of pneumonia (recurrent): Secondary | ICD-10-CM

## 2021-11-26 DIAGNOSIS — Y92009 Unspecified place in unspecified non-institutional (private) residence as the place of occurrence of the external cause: Secondary | ICD-10-CM | POA: Diagnosis not present

## 2021-11-26 DIAGNOSIS — W010XXA Fall on same level from slipping, tripping and stumbling without subsequent striking against object, initial encounter: Secondary | ICD-10-CM | POA: Diagnosis present

## 2021-11-26 DIAGNOSIS — G8929 Other chronic pain: Secondary | ICD-10-CM | POA: Diagnosis present

## 2021-11-26 DIAGNOSIS — J9601 Acute respiratory failure with hypoxia: Secondary | ICD-10-CM | POA: Diagnosis not present

## 2021-11-26 DIAGNOSIS — M545 Low back pain, unspecified: Secondary | ICD-10-CM | POA: Diagnosis present

## 2021-11-26 DIAGNOSIS — K219 Gastro-esophageal reflux disease without esophagitis: Secondary | ICD-10-CM | POA: Diagnosis not present

## 2021-11-26 DIAGNOSIS — Z7401 Bed confinement status: Secondary | ICD-10-CM | POA: Diagnosis not present

## 2021-11-26 DIAGNOSIS — Z79890 Hormone replacement therapy (postmenopausal): Secondary | ICD-10-CM

## 2021-11-26 DIAGNOSIS — F319 Bipolar disorder, unspecified: Secondary | ICD-10-CM | POA: Diagnosis not present

## 2021-11-26 DIAGNOSIS — E861 Hypovolemia: Secondary | ICD-10-CM | POA: Diagnosis present

## 2021-11-26 DIAGNOSIS — S0993XA Unspecified injury of face, initial encounter: Secondary | ICD-10-CM | POA: Diagnosis not present

## 2021-11-26 DIAGNOSIS — R41841 Cognitive communication deficit: Secondary | ICD-10-CM | POA: Diagnosis not present

## 2021-11-26 DIAGNOSIS — Z4789 Encounter for other orthopedic aftercare: Secondary | ICD-10-CM | POA: Diagnosis not present

## 2021-11-26 DIAGNOSIS — Z79899 Other long term (current) drug therapy: Secondary | ICD-10-CM

## 2021-11-26 DIAGNOSIS — Z96642 Presence of left artificial hip joint: Secondary | ICD-10-CM | POA: Diagnosis present

## 2021-11-26 DIAGNOSIS — Z79891 Long term (current) use of opiate analgesic: Secondary | ICD-10-CM

## 2021-11-26 DIAGNOSIS — M4692 Unspecified inflammatory spondylopathy, cervical region: Secondary | ICD-10-CM | POA: Diagnosis not present

## 2021-11-26 DIAGNOSIS — S72001A Fracture of unspecified part of neck of right femur, initial encounter for closed fracture: Principal | ICD-10-CM

## 2021-11-26 DIAGNOSIS — F419 Anxiety disorder, unspecified: Secondary | ICD-10-CM | POA: Diagnosis not present

## 2021-11-26 DIAGNOSIS — M810 Age-related osteoporosis without current pathological fracture: Secondary | ICD-10-CM | POA: Diagnosis present

## 2021-11-26 DIAGNOSIS — Z471 Aftercare following joint replacement surgery: Secondary | ICD-10-CM | POA: Diagnosis not present

## 2021-11-26 DIAGNOSIS — D649 Anemia, unspecified: Secondary | ICD-10-CM | POA: Diagnosis not present

## 2021-11-26 DIAGNOSIS — M25551 Pain in right hip: Secondary | ICD-10-CM | POA: Diagnosis present

## 2021-11-26 DIAGNOSIS — D72829 Elevated white blood cell count, unspecified: Secondary | ICD-10-CM | POA: Diagnosis present

## 2021-11-26 DIAGNOSIS — J9811 Atelectasis: Secondary | ICD-10-CM | POA: Diagnosis present

## 2021-11-26 DIAGNOSIS — S72011A Unspecified intracapsular fracture of right femur, initial encounter for closed fracture: Principal | ICD-10-CM | POA: Diagnosis present

## 2021-11-26 DIAGNOSIS — G43909 Migraine, unspecified, not intractable, without status migrainosus: Secondary | ICD-10-CM | POA: Diagnosis present

## 2021-11-26 DIAGNOSIS — Z888 Allergy status to other drugs, medicaments and biological substances status: Secondary | ICD-10-CM

## 2021-11-26 DIAGNOSIS — M6281 Muscle weakness (generalized): Secondary | ICD-10-CM | POA: Diagnosis not present

## 2021-11-26 DIAGNOSIS — J45909 Unspecified asthma, uncomplicated: Secondary | ICD-10-CM | POA: Diagnosis present

## 2021-11-26 DIAGNOSIS — D509 Iron deficiency anemia, unspecified: Secondary | ICD-10-CM | POA: Diagnosis not present

## 2021-11-26 DIAGNOSIS — Y9301 Activity, walking, marching and hiking: Secondary | ICD-10-CM | POA: Diagnosis present

## 2021-11-26 DIAGNOSIS — R531 Weakness: Secondary | ICD-10-CM | POA: Diagnosis not present

## 2021-11-26 DIAGNOSIS — R2689 Other abnormalities of gait and mobility: Secondary | ICD-10-CM | POA: Diagnosis not present

## 2021-11-26 DIAGNOSIS — F1120 Opioid dependence, uncomplicated: Secondary | ICD-10-CM | POA: Diagnosis not present

## 2021-11-26 DIAGNOSIS — W19XXXA Unspecified fall, initial encounter: Secondary | ICD-10-CM | POA: Diagnosis not present

## 2021-11-26 DIAGNOSIS — T07XXXA Unspecified multiple injuries, initial encounter: Secondary | ICD-10-CM | POA: Diagnosis not present

## 2021-11-26 DIAGNOSIS — I6381 Other cerebral infarction due to occlusion or stenosis of small artery: Secondary | ICD-10-CM | POA: Diagnosis not present

## 2021-11-26 DIAGNOSIS — M47812 Spondylosis without myelopathy or radiculopathy, cervical region: Secondary | ICD-10-CM | POA: Diagnosis not present

## 2021-11-26 DIAGNOSIS — S199XXA Unspecified injury of neck, initial encounter: Secondary | ICD-10-CM | POA: Diagnosis not present

## 2021-11-26 DIAGNOSIS — Z96641 Presence of right artificial hip joint: Secondary | ICD-10-CM | POA: Diagnosis not present

## 2021-11-26 DIAGNOSIS — Z981 Arthrodesis status: Secondary | ICD-10-CM | POA: Diagnosis not present

## 2021-11-26 DIAGNOSIS — K449 Diaphragmatic hernia without obstruction or gangrene: Secondary | ICD-10-CM | POA: Diagnosis not present

## 2021-11-26 DIAGNOSIS — J449 Chronic obstructive pulmonary disease, unspecified: Secondary | ICD-10-CM | POA: Diagnosis not present

## 2021-11-26 LAB — COMPREHENSIVE METABOLIC PANEL
ALT: 18 U/L (ref 0–44)
AST: 21 U/L (ref 15–41)
Albumin: 3.8 g/dL (ref 3.5–5.0)
Alkaline Phosphatase: 61 U/L (ref 38–126)
Anion gap: 11 (ref 5–15)
BUN: 18 mg/dL (ref 8–23)
CO2: 22 mmol/L (ref 22–32)
Calcium: 9.8 mg/dL (ref 8.9–10.3)
Chloride: 100 mmol/L (ref 98–111)
Creatinine, Ser: 0.81 mg/dL (ref 0.44–1.00)
GFR, Estimated: >60 mL/min (ref >60)
Glucose, Bld: 114 mg/dL — ABNORMAL HIGH (ref 70–99)
Potassium: 4.2 mmol/L (ref 3.5–5.1)
Sodium: 133 mmol/L — ABNORMAL LOW (ref 135–145)
Total Bilirubin: 0.4 mg/dL (ref 0.3–1.2)
Total Protein: 6.2 g/dL — ABNORMAL LOW (ref 6.5–8.1)

## 2021-11-26 LAB — CBC
HCT: 37.4 % (ref 36.0–46.0)
Hemoglobin: 11.5 g/dL — ABNORMAL LOW (ref 12.0–15.0)
MCH: 26.6 pg (ref 26.0–34.0)
MCHC: 30.7 g/dL (ref 30.0–36.0)
MCV: 86.6 fL (ref 80.0–100.0)
Platelets: 240 10*3/uL (ref 150–400)
RBC: 4.32 MIL/uL (ref 3.87–5.11)
RDW: 19.5 % — ABNORMAL HIGH (ref 11.5–15.5)
WBC: 13.9 10*3/uL — ABNORMAL HIGH (ref 4.0–10.5)
nRBC: 0 % (ref 0.0–0.2)

## 2021-11-26 LAB — CK TOTAL AND CKMB (NOT AT ARMC)
CK, MB: 3.1 ng/mL (ref 0.5–5.0)
Relative Index: INVALID (ref 0.0–2.5)
Total CK: 73 U/L (ref 38–234)

## 2021-11-26 MED ORDER — OXYCODONE HCL 5 MG PO TABS
5.0000 mg | ORAL_TABLET | ORAL | Status: DC | PRN
  Administered 2021-11-26 (×2): 5 mg via ORAL
  Filled 2021-11-26 (×2): qty 1

## 2021-11-26 MED ORDER — ONDANSETRON 4 MG PO TBDP: 4.0000 mg | ORAL_TABLET | Freq: Three times a day (TID) | ORAL | Status: DC | PRN

## 2021-11-26 MED ORDER — GABAPENTIN 300 MG PO CAPS
600.0000 mg | ORAL_CAPSULE | Freq: Three times a day (TID) | ORAL | Status: DC | PRN
  Administered 2021-11-28: 600 mg via ORAL
  Filled 2021-11-26: qty 2

## 2021-11-26 MED ORDER — ALBUTEROL SULFATE HFA 108 (90 BASE) MCG/ACT IN AERS
2.0000 | INHALATION_SPRAY | Freq: Four times a day (QID) | RESPIRATORY_TRACT | Status: DC | PRN
Start: 2021-11-26 — End: 2021-11-26

## 2021-11-26 MED ORDER — ACETAMINOPHEN 325 MG PO TABS
650.0000 mg | ORAL_TABLET | Freq: Four times a day (QID) | ORAL | Status: DC
  Administered 2021-11-26 (×3): 650 mg via ORAL
  Filled 2021-11-26 (×5): qty 2

## 2021-11-26 MED ORDER — FAMOTIDINE 20 MG PO TABS
20.0000 mg | ORAL_TABLET | Freq: Two times a day (BID) | ORAL | Status: DC
  Administered 2021-11-26 – 2021-12-01 (×9): 20 mg via ORAL
  Filled 2021-11-26 (×10): qty 1

## 2021-11-26 MED ORDER — FERROUS SULFATE 325 (65 FE) MG PO TABS
325.0000 mg | ORAL_TABLET | Freq: Every day | ORAL | Status: DC
  Administered 2021-11-28 – 2021-12-01 (×4): 325 mg via ORAL
  Filled 2021-11-26 (×5): qty 1

## 2021-11-26 MED ORDER — VITAMIN D 25 MCG (1000 UNIT) PO TABS
1000.0000 [IU] | ORAL_TABLET | Freq: Every day | ORAL | Status: DC
  Administered 2021-11-28 – 2021-12-01 (×4): 1000 [IU] via ORAL
  Filled 2021-11-26 (×5): qty 1

## 2021-11-26 MED ORDER — DEXTROSE-NACL 5-0.9 % IV SOLN: INTRAVENOUS | Status: DC

## 2021-11-26 MED ORDER — LEVOTHYROXINE SODIUM 125 MCG PO TABS
125.0000 ug | ORAL_TABLET | Freq: Every day | ORAL | Status: DC
  Administered 2021-11-28 – 2021-12-01 (×4): 125 ug via ORAL
  Filled 2021-11-26 (×4): qty 1

## 2021-11-26 MED ORDER — QUETIAPINE FUMARATE 50 MG PO TABS
200.0000 mg | ORAL_TABLET | Freq: Every evening | ORAL | Status: DC
  Administered 2021-11-26: 100 mg via ORAL
  Administered 2021-11-28 – 2021-12-01 (×4): 200 mg via ORAL
  Filled 2021-11-26: qty 4
  Filled 2021-11-26: qty 2
  Filled 2021-11-26 (×3): qty 4
  Filled 2021-11-26: qty 2

## 2021-11-26 MED ORDER — ADULT MULTIVITAMIN W/MINERALS CH
1.0000 | ORAL_TABLET | Freq: Every day | ORAL | Status: DC
  Administered 2021-11-26 – 2021-12-01 (×5): 1 via ORAL
  Filled 2021-11-26 (×7): qty 1

## 2021-11-26 MED ORDER — CALCIUM CARBONATE 1250 (500 CA) MG PO TABS
1.0000 | ORAL_TABLET | Freq: Every day | ORAL | Status: DC
  Administered 2021-11-28 – 2021-12-01 (×4): 1250 mg via ORAL
  Filled 2021-11-26 (×5): qty 1

## 2021-11-26 MED ORDER — LORAZEPAM 0.5 MG PO TABS
0.5000 mg | ORAL_TABLET | Freq: Once | ORAL | Status: AC | PRN
  Administered 2021-11-27: 0.5 mg via ORAL
  Filled 2021-11-26: qty 1

## 2021-11-26 MED ORDER — IOHEXOL 350 MG/ML SOLN
75.0000 mL | Freq: Once | INTRAVENOUS | Status: AC | PRN
  Administered 2021-11-26: 75 mL via INTRAVENOUS

## 2021-11-26 MED ORDER — LORAZEPAM 2 MG/ML IJ SOLN
INTRAMUSCULAR | Status: AC
  Administered 2021-11-26: 0.5 mg via INTRAVENOUS
  Filled 2021-11-26: qty 1

## 2021-11-26 MED ORDER — MORPHINE SULFATE (PF) 2 MG/ML IV SOLN
1.0000 mg | INTRAVENOUS | Status: DC | PRN
  Administered 2021-11-26 – 2021-11-27 (×8): 1 mg via INTRAVENOUS
  Filled 2021-11-26 (×8): qty 1

## 2021-11-26 MED ORDER — FENTANYL CITRATE PF 50 MCG/ML IJ SOSY
50.0000 ug | PREFILLED_SYRINGE | Freq: Once | INTRAMUSCULAR | Status: AC
  Administered 2021-11-26: 50 ug via INTRAVENOUS
  Filled 2021-11-26: qty 1

## 2021-11-26 MED ORDER — CHLORHEXIDINE GLUCONATE 4 % EX LIQD: 60.0000 mL | Freq: Once | CUTANEOUS | Status: DC

## 2021-11-26 MED ORDER — HYDROMORPHONE HCL 1 MG/ML IJ SOLN
0.5000 mg | Freq: Once | INTRAMUSCULAR | Status: AC
  Administered 2021-11-26: 0.5 mg via INTRAVENOUS
  Filled 2021-11-26: qty 1

## 2021-11-26 MED ORDER — CELECOXIB 200 MG PO CAPS
200.0000 mg | ORAL_CAPSULE | Freq: Two times a day (BID) | ORAL | Status: DC
  Administered 2021-11-26 – 2021-11-27 (×2): 200 mg via ORAL
  Filled 2021-11-26 (×3): qty 1

## 2021-11-26 MED ORDER — OXCARBAZEPINE 300 MG PO TABS
600.0000 mg | ORAL_TABLET | Freq: Two times a day (BID) | ORAL | Status: DC
  Administered 2021-11-26 – 2021-12-01 (×10): 600 mg via ORAL
  Filled 2021-11-26 (×12): qty 2

## 2021-11-26 MED ORDER — PANTOPRAZOLE SODIUM 40 MG PO TBEC
40.0000 mg | DELAYED_RELEASE_TABLET | Freq: Every day | ORAL | Status: DC
  Administered 2021-11-26 – 2021-12-01 (×5): 40 mg via ORAL
  Filled 2021-11-26 (×6): qty 1

## 2021-11-26 MED ORDER — LORAZEPAM 2 MG/ML IJ SOLN
0.5000 mg | Freq: Once | INTRAMUSCULAR | Status: AC
Start: 2021-11-26 — End: 2021-11-26

## 2021-11-26 MED ORDER — ALBUTEROL SULFATE (2.5 MG/3ML) 0.083% IN NEBU
2.5000 mg | INHALATION_SOLUTION | Freq: Four times a day (QID) | RESPIRATORY_TRACT | Status: DC | PRN
  Administered 2021-11-27 – 2021-11-29 (×4): 2.5 mg via RESPIRATORY_TRACT
  Filled 2021-11-26 (×4): qty 3

## 2021-11-26 MED ORDER — KCL IN DEXTROSE-NACL 20-5-0.45 MEQ/L-%-% IV SOLN
INTRAVENOUS | Status: DC
  Filled 2021-11-26 (×2): qty 1000

## 2021-11-26 MED ORDER — FLUOXETINE HCL 20 MG PO CAPS
20.0000 mg | ORAL_CAPSULE | Freq: Two times a day (BID) | ORAL | Status: DC
  Administered 2021-11-26 – 2021-12-01 (×10): 20 mg via ORAL
  Filled 2021-11-26 (×11): qty 1

## 2021-11-26 MED ORDER — BUPROPION HCL ER (XL) 150 MG PO TB24
150.0000 mg | ORAL_TABLET | Freq: Two times a day (BID) | ORAL | Status: DC
  Administered 2021-11-26 – 2021-12-01 (×9): 150 mg via ORAL
  Filled 2021-11-26 (×11): qty 1

## 2021-11-26 MED ORDER — OXYCODONE HCL 5 MG PO TABS
10.0000 mg | ORAL_TABLET | ORAL | Status: DC | PRN
  Administered 2021-11-26: 5 mg via ORAL
  Administered 2021-11-26 – 2021-12-01 (×11): 10 mg via ORAL
  Filled 2021-11-26 (×13): qty 2

## 2021-11-26 NOTE — ED Triage Notes (Signed)
Patient fell during the night and complaining of severe right hip pain with any movement. No loc. Patient received fentanyl 100 pta with relief. No obvious shortening noted

## 2021-11-26 NOTE — ED Notes (Signed)
Called [facility] to transport patient to [facility]

## 2021-11-26 NOTE — Assessment & Plan Note (Addendum)
Bipolar disorder.  Continue with oxcarbazepine, bupropion, fluoxetine and quetiapine.

## 2021-11-26 NOTE — Plan of Care (Signed)
  Problem: Education: Goal: Knowledge of General Education information will improve Description: Including pain rating scale, medication(s)/side effects and non-pharmacologic comfort measures Outcome: Progressing   Problem: Activity: Goal: Risk for activity intolerance will decrease Outcome: Progressing   Problem: Pain Managment: Goal: General experience of comfort will improve Outcome: Progressing   

## 2021-11-26 NOTE — ED Provider Triage Note (Signed)
Emergency Medicine Provider Triage Evaluation Note  VIOLANDA BOBECK , a 71 y.o. female  was evaluated in triage.  Pt complains of right hip pain after a fall that occurred around 10 PM last night.  She was unable to get up on her own.  She is not on blood thinners.  C-collar is in place.  Unsure of head injury.  Hx of left hip replacement  Review of Systems  Positive:  Negative:   Physical Exam  BP (!) 142/99 (BP Location: Right Arm)   Pulse 86   Temp 98.6 F (37 C) (Oral)   Resp 18   SpO2 91%  Gen:   Awake, no distress   Resp:  Normal effort  MSK:   Moves extremities without difficulty  Other:  2 + pedal pulses. No obvious deformity noted.   Medical Decision Making  Medically screening exam initiated at 7:40 AM.  Appropriate orders placed.  DARLISA SPRUIELL was informed that the remainder of the evaluation will be completed by another provider, this initial triage assessment does not replace that evaluation, and the importance of remaining in the ED until their evaluation is complete.     Adolphus Birchwood, [place] 11/26/21 [phone]

## 2021-11-26 NOTE — Assessment & Plan Note (Signed)
Her 02 saturation was 86% on room air on admission Imaging with no significant infiltrate, she does have a large hiatal hernia that predisposes to atelectasis. Plan to follow up on CT chest ordered in the ED Continue supplemental 02 per [place] to keep 02 saturation 92% or greater Incentive spirometer q 4 hrs and continue oxymetry monitoring.

## 2021-11-26 NOTE — ED Provider Notes (Signed)
[facility] Provider Note   CSN: [id number] Arrival date & time: 11/26/21  5784     History  Chief Complaint  Patient presents with   Hip Pain    [facility] is a 71 y.o. female with medical history of anemia, osteoporosis, chronic back pain, DDD, GERD, hypothyroidism, status post left hip replacement in 2022.  Patient presents to ED for evaluation of fall.  Patient reports that last night she was in her bathroom getting ready for bed when she had a ground-level fall due to the fact she tripped.  Patient reports landing on her right side at this time.  Patient reports immediate pain in her right hip as well as inability to straighten her right leg.  The patient reports she will the ground for possibly 6 to 7 hours before she was able to call EMS.  Patient states EMS provided her with 100 mcg of fentanyl which did not alleviate her pain.  On examination the patient is complaining of right hip pain.  The patient denies taking blood thinners, denies hitting her head.  Patient denies any headache, neck pain, nausea, vomiting, loss of consciousness.  Patient denies any shortness of breath, chest pain. Patient denies numbness in her right leg.   Hip Pain Pertinent negatives include no chest pain, no headaches and no shortness of breath.       Home Medications Prior to Admission medications   Medication Sig Start Date End Date Taking? Authorizing Provider  albuterol (PROVENTIL HFA;VENTOLIN HFA) 108 (90 Base) MCG/ACT inhaler Inhale 2 puffs into the lungs every 6 (six) hours as needed for wheezing or shortness of breath.    [provider]  albuterol (PROVENTIL) (2.5 MG/3ML) 0.083% nebulizer solution Take 2.5 mg by nebulization every 6 (six) hours as needed for wheezing or shortness of breath. Patient not taking: Reported on 10/19/2021    [provider]  buPROPion (WELLBUTRIN XL) 150 MG 24 hr tablet Take 150 mg by mouth 2 (two) times  daily. 10/01/16   [provider]  calcium carbonate (OS-CAL - DOSED IN MG OF ELEMENTAL CALCIUM) 1250 (500 Ca) MG tablet Take 1 tablet by mouth daily with breakfast.    [provider]  cholecalciferol (VITAMIN D3) 25 MCG (1000 UNIT) tablet Take 1,000 Units by mouth daily.    [provider]  EPINEPHrine 0.3 mg/0.3 mL IJ SOAJ injection Inject 0.3 mg into the skin daily as needed for anaphylaxis.  Patient not taking: Reported on 10/19/2021 04/10/19   [provider]  famotidine (PEPCID) 20 MG tablet Take 1 tablet (20 mg total) by mouth 2 (two) times daily. 10/20/21   Sherwood Gambler, MD  ferrous sulfate 325 (65 FE) MG tablet Take 325 mg by mouth in the morning and at bedtime. 09/27/21   [provider]  FLUoxetine (PROZAC) 20 MG capsule Take 20 mg by mouth 2 (two) times daily.     [provider]  gabapentin (NEURONTIN) 600 MG tablet Take 600 mg by mouth 3 (three) times daily as needed (restless leg). 01/09/21   [provider]  levothyroxine (SYNTHROID) 125 MCG tablet Take 1 tablet (125 mcg total) by mouth daily. 01/27/21 01/27/22  Mercy Riding, MD  Multiple Vitamin (MULTIVITAMIN WITH MINERALS) TABS tablet Take 1 tablet by mouth daily.    [provider]  omeprazole (PRILOSEC) 20 MG capsule Take 1 capsule (20 mg total) by mouth daily. 10/19/21   Maudie Flakes, MD  ondansetron (ZOFRAN-ODT)  4 MG disintegrating tablet Take 1 tablet (4 mg total) by mouth every 8 (eight) hours as needed for nausea or vomiting. 10/20/21   Sherwood Gambler, MD  oxcarbazepine (TRILEPTAL) 600 MG tablet Take 600 mg by mouth 2 (two) times daily.    [provider]  oxyCODONE ER (XTAMPZA ER) 9 MG C12A Take 9 mg by mouth 2 (two) times daily.    [provider]  Oxycodone HCl 10 MG TABS Take 1 tablet (10 mg total) by mouth every 4 (four) hours as needed (for breakthrough pain). Patient taking differently: Take 10 mg by mouth 2 (two) times daily as  needed (for breakthrough pain). 02/26/20   Dorothyann Peng, PA-C  QUEtiapine (SEROQUEL) 400 MG tablet Take 200 mg by mouth every evening. 11/12/20   [provider]  rizatriptan (MAXALT) 10 MG tablet Take 1 tablet (10 mg total) by mouth daily as needed for migraine. Patient not taking: Reported on 10/19/2021 01/27/21   Mercy Riding, MD  sucralfate (CARAFATE) 1 g tablet Take 1 tablet (1 g total) by mouth 4 (four) times daily as needed. 10/19/21   Maudie Flakes, MD      Allergies    Prednisone, Lithium carbonate, Other, and Sulfa antibiotics    Review of Systems   Review of Systems  Respiratory:  Negative for shortness of breath.   Cardiovascular:  Negative for chest pain.  Gastrointestinal:  Negative for nausea and vomiting.  Musculoskeletal:  Positive for arthralgias. Negative for neck pain.  Neurological:  Negative for syncope and headaches.  All other systems reviewed and are negative.   Physical Exam Updated Vital Signs BP (!) 155/87 (BP Location: Right Arm)   Pulse 74   Temp 97.8 F (36.6 C) (Oral)   Resp 16   Ht '5\' 6"'$  (1.676 m)   Wt 69.9 kg   SpO2 94%   BMI 24.86 kg/m  Physical Exam Vitals and nursing note reviewed.  Constitutional:      General: She is not in acute distress.    Appearance: Normal appearance. She is not ill-appearing, toxic-appearing or diaphoretic.  HENT:     Head: Normocephalic and atraumatic.     Nose: Nose normal. No congestion.     Mouth/Throat:     Mouth: Mucous membranes are moist.     Pharynx: Oropharynx is clear.  Eyes:     Extraocular Movements: Extraocular movements intact.     Conjunctiva/sclera: Conjunctivae normal.     Pupils: Pupils are equal, round, and reactive to light.  Cardiovascular:     Rate and Rhythm: Normal rate and regular rhythm.  Pulmonary:     Effort: Pulmonary effort is normal.     Breath sounds: Normal breath sounds. No wheezing.  Abdominal:     General: Abdomen is flat. Bowel sounds are normal.      Palpations: Abdomen is soft.     Tenderness: There is no abdominal tenderness.  Musculoskeletal:     Cervical back: Normal range of motion and neck supple. No tenderness.     Right hip: Deformity and tenderness present. Decreased range of motion.     Comments: Patient with shortening and rotation to her right leg.  Patient has 2+ DP pulse in her right foot.  Neurovascularly intact.  Patient with extreme tenderness to palpation about the right hip.  Skin:    General: Skin is warm and dry.     Capillary Refill: Capillary refill takes less than 2 seconds.  Neurological:  Mental Status: She is alert and oriented to person, place, and time.     ED Results / Procedures / Treatments   Labs (all labs ordered are listed, but only abnormal results are displayed) Labs Reviewed  COMPREHENSIVE METABOLIC PANEL - Abnormal; Notable for the following components:      Result Value   Sodium 133 (*)    Glucose, Bld 114 (*)    Total Protein 6.2 (*)    All other components within normal limits  CBC - Abnormal; Notable for the following components:   WBC 13.9 (*)    Hemoglobin 11.5 (*)    RDW 19.5 (*)    All other components within normal limits  CK TOTAL AND CKMB (NOT AT [facility])  URINALYSIS, ROUTINE W REFLEX MICROSCOPIC    EKG None  Radiology CT Angio Chest PE W and/or Wo Contrast  Result Date: 11/26/2021 CLINICAL DATA:  Suspect pulmonary embolism.  Unknown D-dimer. EXAM: CT ANGIOGRAPHY CHEST WITH CONTRAST TECHNIQUE: Multidetector CT imaging of the chest was performed using the standard protocol during bolus administration of intravenous contrast. Multiplanar CT image reconstructions and MIPs were obtained to evaluate the vascular anatomy. RADIATION DOSE REDUCTION: This exam was performed according to the departmental dose-optimization program which includes automated exposure control, adjustment of the mA and/or kV according to patient size and/or use of iterative reconstruction technique.  CONTRAST:  22m OMNIPAQUE IOHEXOL 350 MG/ML SOLN COMPARISON:  Chest x-ray 11/26/2021 CT thoracic spine 11/26/2016 FINDINGS: Cardiovascular: Borderline cardiomegaly. Prominent right pericardiac fat pad. Thoracic aorta is normal in caliber. Minimal calcified plaque over the descending thoracic aorta. Pulmonary arterial system is adequately opacified and demonstrates no evidence of emboli. Remaining vascular structures are unremarkable. Mediastinum/Nodes: No mediastinal or hilar adenopathy. Moderate to large hiatal hernia. Prominent right pericardiac fat. Lungs/Pleura: Lungs are adequately inflated with mild atelectasis/scarring over the right middle lobe adjacent the prominent right pericardiac fat pad. Minimal atelectatic change over the left lung base adjacent the lateral aspect of the large hiatal hernia. No acute airspace process or effusion. Airways are otherwise unremarkable. Upper Abdomen: Calcified plaque over the abdominal aorta. Mild diverticulosis of the colon. No acute findings. Musculoskeletal: Previous kyphoplasties of T12 and L2. Stable compression deformity of L1. Moderate compression fracture of T8 and severe compression fracture of T3 likely chronic, although new since 2018. Review of the MIP images confirms the above findings. IMPRESSION: 1. No evidence of pulmonary embolism. No acute cardiopulmonary disease. 2. Moderate to large hiatal hernia. Prominent right pericardiac fat pad. 3. Multiple thoracic compression fractures as described likely chronic, although new since 2018. Chronic lumbar compression fractures. 4. Colonic diverticulosis. 5. Aortic atherosclerosis. Aortic Atherosclerosis (ICD10-I70.0). Electronically Signed   By: DMarin OlpM.D.   On: 11/26/2021 10:53   CT Cervical Spine Wo Contrast  Result Date: 11/26/2021 CLINICAL DATA:  Neck trauma (Age >= 65y) EXAM: CT CERVICAL SPINE WITHOUT CONTRAST TECHNIQUE: Multidetector CT imaging of the cervical spine was performed without  intravenous contrast. Multiplanar CT image reconstructions were also generated. RADIATION DOSE REDUCTION: This exam was performed according to the departmental dose-optimization program which includes automated exposure control, adjustment of the mA and/or kV according to patient size and/or use of iterative reconstruction technique. COMPARISON:  None Available. FINDINGS: Alignment: Facet joints are aligned without dislocation or traumatic listhesis. Dens and lateral masses are aligned. Degenerative grade 1 anterolisthesis of C3 on C4. Skull base and vertebrae: No acute fracture. No primary bone lesion or focal pathologic process. Soft tissues and spinal canal: No prevertebral  fluid or swelling. No visible canal hematoma. Disc levels: Mild disc height loss, most pronounced at C5-6. Moderate-advanced multilevel facet arthropathy most severe at C3-4 on the left. Upper chest: Negative. Other: None. IMPRESSION: No acute fracture or traumatic listhesis of the cervical spine. Electronically Signed   By: Davina Poke D.O.   On: 11/26/2021 10:48   DG Chest 1 View  Result Date: 11/26/2021 CLINICAL DATA:  Status post fall. 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 Scar versus subsegmental atelectasis noted within the left midlung and left base. No signs of pleural effusion or edema. No airspace opacities identified. Visualized osseous structures appear grossly intact. IMPRESSION: 1. Scar versus platelike atelectasis within the left lung. No signs of pleural effusion or pneumothorax. No airspace opacities. 2. Large hiatal hernia. Electronically Signed   By: Kerby Moors M.D.   On: 11/26/2021 08:33   DG Hip Unilat W or Wo Pelvis 2-3 Views Right  Result Date: 11/26/2021 CLINICAL DATA:  Fall. EXAM: DG HIP (WITH OR WITHOUT PELVIS) 2-3V RIGHT COMPARISON:  03/09/2020 FINDINGS: There is  diffuse decreased bone mineralization. Examination demonstrates a displaced subcapital fracture of the right femoral neck with superolateral displacement of the distal fragment. Left hip arthroplasty intact and unchanged. Fusion hardware over the lumbar spine. Degenerative changes of the spine. IMPRESSION: Displaced subcapital fracture of the right femoral neck. Electronically Signed   By: Marin Olp M.D.   On: 11/26/2021 08:32   CT HEAD WO CONTRAST  Result Date: 11/26/2021 CLINICAL DATA:  Moderate to severe head and facial trauma EXAM: CT HEAD WITHOUT CONTRAST CT MAXILLOFACIAL WITHOUT CONTRAST TECHNIQUE: Multidetector CT imaging of the head and maxillofacial structures were performed using the standard protocol without intravenous contrast. Multiplanar CT image reconstructions of the maxillofacial structures were also generated. RADIATION DOSE REDUCTION: This exam was performed according to the departmental dose-optimization program which includes automated exposure control, adjustment of the mA and/or kV according to patient size and/or use of iterative reconstruction technique. COMPARISON:  Prior CT scan of the head 01/26/2021 FINDINGS: CT HEAD FINDINGS Brain: No evidence of acute infarction, hemorrhage, hydrocephalus, extra-axial collection or mass lesion/mass effect. Mild cerebral cortical volume loss. Tiny remote lacunar infarct in the right cerebellum again noted. Rounded calcification affiliated with the posterior falx measures 1.1 x 1.2 cm. This is essentially unchanged compared to prior. Vascular: No hyperdense vessel or unexpected calcification. Skull: Normal. Negative for fracture or focal lesion. Other: None. CT MAXILLOFACIAL FINDINGS Osseous: No fracture or mandibular dislocation. No destructive process. Orbits: Preseptal periorbital soft tissue edema on the right consistent with contusion. No evidence of injury to the globe or extension retro-orbital. mild exophthalmos bilaterally. Sinuses:  Clear. Soft tissues: Right preseptal periorbital soft tissue contusion. IMPRESSION: CT HEAD 1. No acute intracranial abnormality. 2. Stable cortical atrophy and small remote right cerebellar lacunar infarct. 3. Stable rounded calcification along the left posterior falx likely representing a benign meningioma. CT CSPINE 1. Right preseptal periorbital soft tissue contusion without evidence of globe injury, retro bulb are contusion were facial fracture. 2. Mild exophthalmos bilaterally. Electronically Signed   By: Jacqulynn Cadet M.D.   On: 11/26/2021 08:19   CT MAXILLOFACIAL WO CONTRAST  Result Date: 11/26/2021 CLINICAL DATA:  Moderate to severe head and facial trauma EXAM: CT HEAD WITHOUT CONTRAST CT MAXILLOFACIAL WITHOUT CONTRAST TECHNIQUE: Multidetector CT imaging of the head and maxillofacial structures were performed using the standard  protocol without intravenous contrast. Multiplanar CT image reconstructions of the maxillofacial structures were also generated. RADIATION DOSE REDUCTION: This exam was performed according to the departmental dose-optimization program which includes automated exposure control, adjustment of the mA and/or kV according to patient size and/or use of iterative reconstruction technique. COMPARISON:  Prior CT scan of the head 01/26/2021 FINDINGS: CT HEAD FINDINGS Brain: No evidence of acute infarction, hemorrhage, hydrocephalus, extra-axial collection or mass lesion/mass effect. Mild cerebral cortical volume loss. Tiny remote lacunar infarct in the right cerebellum again noted. Rounded calcification affiliated with the posterior falx measures 1.1 x 1.2 cm. This is essentially unchanged compared to prior. Vascular: No hyperdense vessel or unexpected calcification. Skull: Normal. Negative for fracture or focal lesion. Other: None. CT MAXILLOFACIAL FINDINGS Osseous: No fracture or mandibular dislocation. No destructive process. Orbits: Preseptal periorbital soft tissue edema on the  right consistent with contusion. No evidence of injury to the globe or extension retro-orbital. mild exophthalmos bilaterally. Sinuses: Clear. Soft tissues: Right preseptal periorbital soft tissue contusion. IMPRESSION: CT HEAD 1. No acute intracranial abnormality. 2. Stable cortical atrophy and small remote right cerebellar lacunar infarct. 3. Stable rounded calcification along the left posterior falx likely representing a benign meningioma. CT CSPINE 1. Right preseptal periorbital soft tissue contusion without evidence of globe injury, retro bulb are contusion were facial fracture. 2. Mild exophthalmos bilaterally. Electronically Signed   By: Jacqulynn Cadet M.D.   On: 11/26/2021 08:19    Procedures Procedures   Medications Ordered in ED Medications  buPROPion (WELLBUTRIN XL) 24 hr tablet 150 mg (has no administration in time range)  FLUoxetine (PROZAC) capsule 20 mg (has no administration in time range)  QUEtiapine (SEROQUEL) tablet 200 mg (has no administration in time range)  levothyroxine (SYNTHROID) tablet 125 mcg (has no administration in time range)  famotidine (PEPCID) tablet 20 mg (has no administration in time range)  pantoprazole (PROTONIX) EC tablet 40 mg (has no administration in time range)  ondansetron (ZOFRAN-ODT) disintegrating tablet 4 mg (has no administration in time range)  ferrous sulfate tablet 325 mg (has no administration in time range)  gabapentin (NEURONTIN) tablet 600 mg (has no administration in time range)  Oxcarbazepine (TRILEPTAL) tablet 600 mg (has no administration in time range)  calcium carbonate (OS-CAL - dosed in mg of elemental calcium) tablet 1,250 mg (has no administration in time range)  cholecalciferol (VITAMIN D3) 25 MCG (1000 UNIT) tablet 1,000 Units (has no administration in time range)  multivitamin with minerals tablet 1 tablet (has no administration in time range)  albuterol (PROVENTIL) (2.5 MG/3ML) 0.083% nebulizer solution 2.5 mg (has no  administration in time range)  dextrose 5 %-0.9 % sodium chloride infusion (has no administration in time range)  morphine (PF) 2 MG/ML injection 1 mg (1 mg Intravenous Given 11/26/21 1134)  oxyCODONE (Oxy IR/ROXICODONE) immediate release tablet 5 mg (5 mg Oral Given 11/26/21 1134)  acetaminophen (TYLENOL) tablet 650 mg (650 mg Oral Given 11/26/21 1137)  celecoxib (CELEBREX) capsule 200 mg (has no administration in time range)  fentaNYL (SUBLIMAZE) injection 50 mcg (50 mcg Intravenous Given 11/26/21 0841)  LORazepam (ATIVAN) injection 0.5 mg (0.5 mg Intravenous Given 11/26/21 0905)  HYDROmorphone (DILAUDID) injection 0.5 mg (0.5 mg Intravenous Given 11/26/21 0952)  iohexol (OMNIPAQUE) 350 MG/ML injection 75 mL (75 mLs Intravenous Contrast Given 11/26/21 1034)    ED Course/ Medical Decision Making/ A&P  Medical Decision Making Amount and/or Complexity of Data Reviewed Labs: ordered. Radiology: ordered.   71 year old female presents to ED for evaluation.  Please see HPI for further details.  On examination patient afebrile, nontachycardic.  Patient lung sounds are clear bilaterally but she is hypoxic on room air with an oxygen saturation of 86%.  Patient abdomen soft and compressible throughout.  Patient neurological examination shows no focal neurodeficits.  The patient right lower extremity without shortening or rotation.  There is tenderness to her right hip.  When the patient initially came in her oxygen saturation was 91%.  Patient was placed on 2 L of oxygen via nasal cannula at this time. The patient denies any chest pain, shortness of breath.  This patient's hypoxia could be related to narcotic pain medication she was given however she is still complaining of pain, states that the narcotic pain medication she received with EMS "did not touch her pain".  The patient denies any smoking history, COPD, home oxygen use.  The patient denies any history of blood clots,  recent travel, exogenous hormone use, unilateral leg swelling, hemoptysis.  We will scan this patient with CT angio to rule out blood clot.  We will also collect imaging studies to include CT cervical spine, plain film imaging of chest, right hip x-ray, CT head, CT maxillofacial.  We will order labs to include CBC, BMP, CK, urinalysis.   Patient CBC with slight leukocytosis of 13.9 however the patient is afebrile, nontachycardic.  The patient hemoglobin is decreased to 11.5 however this seems to be patient baseline on chart review.  Patient CK is stable, no elevation.  The patient BMP shows slightly decreased potassium to 133, elevated glucose at 114.  Patient urinalysis is pending.  Patient CT angio shows no evidence of pulmonary embolism, consolidation or effusion in the chest.  The patient CT cervical spine is unremarkable.  The patient plain film imaging of chest shows scar versus platelike atelectasis as well as a large hiatal hernia.  Patient CT head shows no intracranial abnormality.  Patient CT maxillofacial without contrast shows soft tissue swelling.  The patient unilateral film of her right hip shows a displaced subcapital fracture of the right femoral neck.  Patient reports she had original hip replacement on left with Dr. Lyla Glassing of [facility].  [facility] on-call provider Dr. Doran Durand has returned my call and advised that this patient will need to be transferred over to [facility] long so that Dr. Lyla Glassing can operate on her.  Dr. Doran Durand has asked that we make this patient n.p.o. after midnight and asked the hospitalist team to avoid any anticoagulation.  Patient was initially treated with 50 mcg of fentanyl which was ordered in triage.  After this was given, patient continued to complain of pain so we proceeded with 0.5 mg Dilaudid, 0.5 mg Ativan.  Dr. Cathlean Sauer of [facility] hospitalist team has agreed to admit this patient for further work-up and management.  The patient is stable at this time,  amenable to the plan.  Final Clinical Impression(s) / ED Diagnoses Final diagnoses:  Closed fracture of right hip, initial encounter [facility])  Hypoxia    Rx / DC Orders ED Discharge Orders     None         Azucena Cecil, PA-C 11/26/21 1143    Gareth Morgan, MD 11/26/21 2229

## 2021-11-26 NOTE — Assessment & Plan Note (Signed)
Continue with levothyroxine  

## 2021-11-26 NOTE — Assessment & Plan Note (Addendum)
Renal function has been stable, CK is not elevated Hyponatremia due to hypovolemia.   Plan to add isotonic saline and dextrose at 75 ml per hr and follow up electrolytes in am Avoid hypotension and nephrotoxic medications.

## 2021-11-26 NOTE — H&P (Addendum)
History and Physical    Patient: Victoria Levine VHQ:[id number] DOB: 1951/01/06 DOA: 11/26/2021 DOS: the patient was seen and examined on 11/26/2021 PCP: [name], MD  Patient coming from: Home  Chief Complaint:  Chief Complaint  Patient presents with   Hip Pain   HPI: Victoria Levine is a 71 y.o. female with medical history significant of of hypothyroidism, hypertension and depression who presented after suffering of a mechanical fall. Yesterday (11/25/21) around 10 am she was walking from her restroom to her bedroom when she slipped and fell, landing on her right hip. No significant head trauma and no loss of consciousness.  Post fall patient had severe pain at her right hip, 10/10, sharp in nature, worse with movement and with no radiation. She was not able to stand up and remained on the floor until the following day at 6:30 am when she finally was able to reach her phone and called EMS. She slowly crawled into her bedroom to get to her phone.  She had a recent follow up with her primary care provider and her blood pressure was noted to be high, she started taking antihypertensive medication 3 days ago.  No orthostatic symptoms.  Denies any nausea, vomiting, or diarrhea before the fall No fever or chills.    Review of Systems: As mentioned in the history of present illness. All other systems reviewed and are negative. Past Medical History:  Diagnosis Date   Anemia    low iron   Anxiety    Asthma    Bipolar disorder (HCC)    Chronic back pain    Compression fracture of L2 lumbar vertebra (HCC)    DDD (degenerative disc disease)    Depression    Dyspnea    GERD (gastroesophageal reflux disease)    H/O alcohol abuse    28 years sober   H/O suicide attempt    Herpes    History of hiatal hernia    Hypothyroidism    Migraines    Osteoporosis 11/2018   T score of -2.6   Pneumonia    PONV (postoperative nausea and vomiting)    S/P adenoidectomy    S/P  tonsillectomy    Past Surgical History:  Procedure Laterality Date   CESAREAN SECTION     COLONOSCOPY     as a teenager   Compression fracture spine     DILATION AND CURETTAGE OF UTERUS     EYE SURGERY Bilateral    CATARACTS REMOVED, NO LENS PLACED   HYSTEROSCOPY     KYPHOPLASTY N/A 12/19/2016   Procedure: THORACIC 12 KYPHOPLASTY; REQUEST 60 MINS AND FLIP ROOM;  Surgeon: Phylliss Bob, MD;  Location: [place];  Service: Orthopedics;  Laterality: N/A;  THORACIC 12 KYPHOPLASTY; REQUEST 60 MINS AND FLIP ROOM   KYPHOPLASTY N/A 12/11/2017   Procedure: LUMBAR 2 KYPHOPLASTY;  Surgeon: Phylliss Bob, MD;  Location: [place];  Service: Orthopedics;  Laterality: N/A;   LUMBAR FUSION     TONSILLECTOMY     TOTAL HIP ARTHROPLASTY Left 02/24/2020   Procedure: TOTAL HIP ARTHROPLASTY ANTERIOR APPROACH;  Surgeon: Rod Can, MD;  Location: [facility];  Service: Orthopedics;  Laterality: Left;   Social History:  reports that she has never smoked. She has never used smokeless tobacco. She reports current alcohol use. She reports that she does not currently use drugs after having used the following drugs: Benzodiazepines and Cocaine.  Allergies  Allergen Reactions   Prednisone Other (See Comments)    Cannot tolerate  ORALLY, but can tolerate by shot (interacts with anxiety & bipolarity)   Lithium Carbonate Other (See Comments)    Patient was taken off of this because of a weight gain of 40 pounds   Other Other (See Comments)    Serious reaction to 1st COVID vaccination - unknown type   Sulfa Antibiotics Other (See Comments)    From childhood; reaction not recalled    Family History  Adopted: Yes    Prior to Admission medications   Medication Sig Start Date End Date Taking? Authorizing Provider  albuterol (PROVENTIL HFA;VENTOLIN HFA) 108 (90 Base) MCG/ACT inhaler Inhale 2 puffs into the lungs every 6 (six) hours as needed for wheezing or shortness of breath.    [provider]  albuterol  (PROVENTIL) (2.5 MG/3ML) 0.083% nebulizer solution Take 2.5 mg by nebulization every 6 (six) hours as needed for wheezing or shortness of breath. Patient not taking: Reported on 10/19/2021    [provider]  buPROPion (WELLBUTRIN XL) 150 MG 24 hr tablet Take 150 mg by mouth 2 (two) times daily. 10/01/16   [provider]  calcium carbonate (OS-CAL - DOSED IN MG OF ELEMENTAL CALCIUM) 1250 (500 Ca) MG tablet Take 1 tablet by mouth daily with breakfast.    [provider]  cholecalciferol (VITAMIN D3) 25 MCG (1000 UNIT) tablet Take 1,000 Units by mouth daily.    [provider]  EPINEPHrine 0.3 mg/0.3 mL IJ SOAJ injection Inject 0.3 mg into the skin daily as needed for anaphylaxis.  Patient not taking: Reported on 10/19/2021 04/10/19   [provider]  famotidine (PEPCID) 20 MG tablet Take 1 tablet (20 mg total) by mouth 2 (two) times daily. 10/20/21   Sherwood Gambler, MD  ferrous sulfate 325 (65 FE) MG tablet Take 325 mg by mouth in the morning and at bedtime. 09/27/21   [provider]  FLUoxetine (PROZAC) 20 MG capsule Take 20 mg by mouth 2 (two) times daily.     [provider]  gabapentin (NEURONTIN) 600 MG tablet Take 600 mg by mouth 3 (three) times daily as needed (restless leg). 01/09/21   [provider]  levothyroxine (SYNTHROID) 125 MCG tablet Take 1 tablet (125 mcg total) by mouth daily. 01/27/21 01/27/22  Mercy Riding, MD  Multiple Vitamin (MULTIVITAMIN WITH MINERALS) TABS tablet Take 1 tablet by mouth daily.    [provider]  omeprazole (PRILOSEC) 20 MG capsule Take 1 capsule (20 mg total) by mouth daily. 10/19/21   Maudie Flakes, MD  ondansetron (ZOFRAN-ODT) 4 MG disintegrating tablet Take 1 tablet (4 mg total) by mouth every 8 (eight) hours as needed for nausea or vomiting. 10/20/21   Sherwood Gambler, MD  oxcarbazepine (TRILEPTAL) 600 MG tablet Take 600 mg by mouth 2 (two) times daily.    [provider]   oxyCODONE ER (XTAMPZA ER) 9 MG C12A Take 9 mg by mouth 2 (two) times daily.    [provider]  Oxycodone HCl 10 MG TABS Take 1 tablet (10 mg total) by mouth every 4 (four) hours as needed (for breakthrough pain). Patient taking differently: Take 10 mg by mouth 2 (two) times daily as needed (for breakthrough pain). 02/26/20   Dorothyann Peng, PA-C  QUEtiapine (SEROQUEL) 400 MG tablet Take 200 mg by mouth every evening. 11/12/20   [provider]  rizatriptan (MAXALT) 10 MG tablet Take 1 tablet (10 mg total) by mouth daily as needed for migraine. Patient not taking: Reported on 10/19/2021  01/27/21   Mercy Riding, MD  sucralfate (CARAFATE) 1 g tablet Take 1 tablet (1 g total) by mouth 4 (four) times daily as needed. 10/19/21   Maudie Flakes, MD    Physical Exam: Vitals:   11/26/21 4081 11/26/21 0853 11/26/21 0854 11/26/21 1009  BP:  105/80  123/87  Pulse:  85  72  Resp:  (!) 22  15  Temp:      TempSrc:      SpO2:  (!) 86% 95% 92%  Weight: 69.9 kg     Height: '5\' 6"'$  (1.676 m)      Neurology awake and alert ENT with mils pallor Cardiovascular with S1 and S2 present and rhythmic with no gallops, rubs or murmurs. No JVD Lungs with no wheezing, rales or rhonchi Abdomen with no distention  No lower extremity edema Right lower extremity is externally rotated and shortened.  Data Reviewed:   Traumatic subcapital fracture of the right femoral neck.   71 yo female with past medical history of depression and hypothyroidism, recently diagnosed with hypertension and started on antihypertensive regimen who presented after a mechanical fall. No features of syncope or orthostatic symptoms. Patient with severe pain and not able to stand by herself after the fall. She stayed on the floor for 8 hrs before she was able to call for help.  On her initial physical examination her 02 saturation was low to 86% and blood pressure low normal 105/80 mmHg. Cardiovascular examination within  normal limits, right lower extremity with shortening and lateral rotation.   Na 133, K 4,3 CL 100, bicarbonate 22, glucose 114, bun 18 cr 0,81  CK 73  Wbc 13,9 hgb 11,5 plt 240  Head CT with no acute changes. Small remote right cerebellar lacunar infarct Maxillofacial CT with right preseptal periorbital soft tissue contusion without evidence of globe injury.  Cervical CT pending   Hip radiograph with displaced subcapital fracture of the right femoral neck.  Chest radiograph with mild cardiomegaly with positive hiatal hernia, no infiltrates.  CT chest pending   Assessment and Plan: * Hip fracture [facility]) Patient will be admitted to [facility] per orthopedics recommendations. Patient is medically stable for surgical orthopedic procedure. Pain control with oral oxycodone and IV morphine Scheduled acetaminophen and celebrex Continue with home dose of gabapentin.  Per orthopedics recommendations no chemical DVT prophylaxis for today and NPO past midnight.  Patient will need Pt and Ot post operative.   Acute hypoxemic respiratory failure (HCC) Her 02 saturation was 86% on room air on admission Imaging with no significant infiltrate, she does have a large hiatal hernia that predisposes to atelectasis. Plan to follow up on CT chest ordered in the ED Continue supplemental 02 per [place] to keep 02 saturation 92% or greater Incentive spirometer q 4 hrs and continue oxymetry monitoring.   Hyponatremia Renal function has been stable, CK is not elevated Hyponatremia due to hypovolemia.   Plan to add isotonic saline and dextrose at 75 ml per hr and follow up electrolytes in am Avoid hypotension and nephrotoxic medications.   Chronic anemia Hgb is 11,5 plan to follow up post operative, currently with no indication for transfusion. Iron deficiency anemia, continue with iron supplementation.  Reactive leukocytosis, follow up cell count in am.   Depression Bipolar disorder.  Continue with oxcarbazepine,  bupropion, fluoxetine and quetiapine.   GERD (gastroesophageal reflux disease) Continue antiacid therapy with pantoprazole and famotidine Patient has a large hiatal hernia.   Hypothyroidism Continue with levothyroxine.  Advance Care Planning:   Code Status: Full Code   Consults: orthopedics   Family Communication: no family at the bedside   Severity of Illness: The appropriate patient status for this patient is INPATIENT. Inpatient status is judged to be reasonable and necessary in order to provide the required intensity of service to ensure the patient's safety. The patient's presenting symptoms, physical exam findings, and initial radiographic and laboratory data in the context of their chronic comorbidities is felt to place them at high risk for further clinical deterioration. Furthermore, it is not anticipated that the patient will be medically stable for discharge from the hospital within 2 midnights of admission.   * I certify that at the point of admission it is my clinical judgment that the patient will require inpatient hospital care spanning beyond 2 midnights from the point of admission due to high intensity of service, high risk for further deterioration and high frequency of surveillance required.*  Author: Tawni Millers, MD 11/26/2021 10:24 AM  For on call review [email or web address].

## 2021-11-26 NOTE — H&P (View-Only) (Signed)
Reason for Consult: Right hip pain   Referring Physician:  [name] is an 71 y.o. female.  HPI: 71 y/o female with PMH of chronic pain c/o R hip pain since a fall yesterday.  She takes oxycodone at baseline and c/o severe pain in the right hip.  She has a history of left hip replacement for hip fracture last year.  She denies any history of diabetes.  She is not a smoker.  She lives alone.  She fell while walking from the bedroom to the bathroom.  She denies any loss of consciousness.  She denies any pain elsewhere aside from her normal chronic low back pain.  She does not take blood thinners.  Past Medical History:  Diagnosis Date   Anemia    low iron   Anxiety    Asthma    Bipolar disorder (HCC)    Chronic back pain    Compression fracture of L2 lumbar vertebra (HCC)    DDD (degenerative disc disease)    Depression    Dyspnea    GERD (gastroesophageal reflux disease)    H/O alcohol abuse    28 years sober   H/O suicide attempt    Herpes    History of hiatal hernia    Hypothyroidism    Migraines    Osteoporosis 11/2018   T score of -2.6   Pneumonia    PONV (postoperative nausea and vomiting)    S/P adenoidectomy    S/P tonsillectomy     Past Surgical History:  Procedure Laterality Date   CESAREAN SECTION     COLONOSCOPY     as a teenager   Compression fracture spine     DILATION AND CURETTAGE OF UTERUS     EYE SURGERY Bilateral    CATARACTS REMOVED, NO LENS PLACED   HYSTEROSCOPY     KYPHOPLASTY N/A 12/19/2016   Procedure: THORACIC 12 KYPHOPLASTY; REQUEST 60 MINS AND FLIP ROOM;  Surgeon: Phylliss Bob, MD;  Location: [place];  Service: Orthopedics;  Laterality: N/A;  THORACIC 12 KYPHOPLASTY; REQUEST 60 MINS AND FLIP ROOM   KYPHOPLASTY N/A 12/11/2017   Procedure: LUMBAR 2 KYPHOPLASTY;  Surgeon: Phylliss Bob, MD;  Location: [place];  Service: Orthopedics;  Laterality: N/A;   LUMBAR FUSION     TONSILLECTOMY     TOTAL HIP ARTHROPLASTY Left  02/24/2020   Procedure: TOTAL HIP ARTHROPLASTY ANTERIOR APPROACH;  Surgeon: Rod Can, MD;  Location: [facility];  Service: Orthopedics;  Laterality: Left;    Family History  Adopted: Yes    Social History:  reports that she has never smoked. She has never used smokeless tobacco. She reports current alcohol use. She reports that she does not currently use drugs after having used the following drugs: Benzodiazepines and Cocaine.  Allergies:  Allergies  Allergen Reactions   Prednisone Other (See Comments)    Cannot tolerate ORALLY, but can tolerate by shot (interacts with anxiety & bipolarity)   Lithium Carbonate Other (See Comments)    Patient was taken off of this because of a weight gain of 40 pounds   Other Other (See Comments)    Serious reaction to 1st COVID vaccination - unknown type   Sulfa Antibiotics Other (See Comments)    From childhood; reaction not recalled    Medications: I have reviewed the patient's current medications.  Results for orders placed or performed during the hospital encounter of 11/26/21 (from the past 48 hour(s))  Comprehensive metabolic panel  Status: Abnormal   Collection Time: 11/26/21  7:55 AM  Result Value Ref Range   Sodium 133 (L) 135 - 145 mmol/L   Potassium 4.2 3.5 - 5.1 mmol/L   Chloride 100 98 - 111 mmol/L   CO2 22 22 - 32 mmol/L   Glucose, Bld 114 (H) 70 - 99 mg/dL    Comment: Glucose reference range applies only to samples taken after fasting for at least 8 hours.   BUN 18 8 - 23 mg/dL   Creatinine, Ser 0.81 0.44 - 1.00 mg/dL   Calcium 9.8 8.9 - 10.3 mg/dL   Total Protein 6.2 (L) 6.5 - 8.1 g/dL   Albumin 3.8 3.5 - 5.0 g/dL   AST 21 15 - 41 U/L   ALT 18 0 - 44 U/L   Alkaline Phosphatase 61 38 - 126 U/L   Total Bilirubin 0.4 0.3 - 1.2 mg/dL   GFR, Estimated >60 >60 mL/min    Comment: (NOTE) Calculated using the CKD-EPI Creatinine Equation (2021)    Anion gap 11 5 - 15    Comment: Performed at [facility]  [address], [place], [place] [address]  CBC     Status: Abnormal   Collection Time: 11/26/21  7:55 AM  Result Value Ref Range   WBC 13.9 (H) 4.0 - 10.5 K/uL   RBC 4.32 3.87 - 5.11 MIL/uL   Hemoglobin 11.5 (L) 12.0 - 15.0 g/dL   HCT 37.4 36.0 - 46.0 %   MCV 86.6 80.0 - 100.0 fL   MCH 26.6 26.0 - 34.0 pg   MCHC 30.7 30.0 - 36.0 g/dL   RDW 19.5 (H) 11.5 - 15.5 %   Platelets 240 150 - 400 K/uL    Comment: REPEATED TO VERIFY   nRBC 0.0 0.0 - 0.2 %    Comment: Performed at [facility], [place] [address] [place], [place] [address]  CK total and CKMB     Status: None   Collection Time: 11/26/21  7:55 AM  Result Value Ref Range   Total CK 73 38 - 234 U/L   CK, MB 3.1 0.5 - 5.0 ng/mL   Relative Index RELATIVE INDEX IS INVALID 0.0 - 2.5    Comment: WHEN CK < 100 U/L        Performed at [facility] [address] [place], [place] [address]     DG Chest 1 View  Result Date: 11/26/2021 CLINICAL DATA:  Status post fall. 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 Scar versus subsegmental atelectasis noted within the left midlung and left base. No signs of pleural effusion or edema. No airspace opacities identified. Visualized osseous structures appear grossly intact. IMPRESSION: 1. Scar versus platelike atelectasis within the left lung. No signs of pleural effusion or pneumothorax. No airspace opacities. 2. Large hiatal hernia. Electronically Signed   By: Kerby Moors M.D.   On: 11/26/2021 08:33   DG Hip Unilat W or Wo Pelvis 2-3 Views Right  Result Date: 11/26/2021 CLINICAL DATA:  Fall. EXAM: DG HIP (WITH OR WITHOUT PELVIS) 2-3V RIGHT COMPARISON:  03/09/2020 FINDINGS: There is diffuse decreased bone mineralization. Examination demonstrates a displaced subcapital fracture of the right femoral neck with superolateral displacement of the distal fragment. Left hip  arthroplasty intact and unchanged. Fusion hardware over the lumbar spine. Degenerative changes of the spine. IMPRESSION: Displaced subcapital fracture  of the right femoral neck. Electronically Signed   By: Marin Olp M.D.   On: 11/26/2021 08:32   CT HEAD WO CONTRAST  Result Date: 11/26/2021 CLINICAL DATA:  Moderate to severe head and facial trauma EXAM: CT HEAD WITHOUT CONTRAST CT MAXILLOFACIAL WITHOUT CONTRAST TECHNIQUE: Multidetector CT imaging of the head and maxillofacial structures were performed using the standard protocol without intravenous contrast. Multiplanar CT image reconstructions of the maxillofacial structures were also generated. RADIATION DOSE REDUCTION: This exam was performed according to the departmental dose-optimization program which includes automated exposure control, adjustment of the mA and/or kV according to patient size and/or use of iterative reconstruction technique. COMPARISON:  Prior CT scan of the head 01/26/2021 FINDINGS: CT HEAD FINDINGS Brain: No evidence of acute infarction, hemorrhage, hydrocephalus, extra-axial collection or mass lesion/mass effect. Mild cerebral cortical volume loss. Tiny remote lacunar infarct in the right cerebellum again noted. Rounded calcification affiliated with the posterior falx measures 1.1 x 1.2 cm. This is essentially unchanged compared to prior. Vascular: No hyperdense vessel or unexpected calcification. Skull: Normal. Negative for fracture or focal lesion. Other: None. CT MAXILLOFACIAL FINDINGS Osseous: No fracture or mandibular dislocation. No destructive process. Orbits: Preseptal periorbital soft tissue edema on the right consistent with contusion. No evidence of injury to the globe or extension retro-orbital. mild exophthalmos bilaterally. Sinuses: Clear. Soft tissues: Right preseptal periorbital soft tissue contusion. IMPRESSION: CT HEAD 1. No acute intracranial abnormality. 2. Stable cortical atrophy and small remote right cerebellar  lacunar infarct. 3. Stable rounded calcification along the left posterior falx likely representing a benign meningioma. CT CSPINE 1. Right preseptal periorbital soft tissue contusion without evidence of globe injury, retro bulb are contusion were facial fracture. 2. Mild exophthalmos bilaterally. Electronically Signed   By: Jacqulynn Cadet M.D.   On: 11/26/2021 08:19   CT MAXILLOFACIAL WO CONTRAST  Result Date: 11/26/2021 CLINICAL DATA:  Moderate to severe head and facial trauma EXAM: CT HEAD WITHOUT CONTRAST CT MAXILLOFACIAL WITHOUT CONTRAST TECHNIQUE: Multidetector CT imaging of the head and maxillofacial structures were performed using the standard protocol without intravenous contrast. Multiplanar CT image reconstructions of the maxillofacial structures were also generated. RADIATION DOSE REDUCTION: This exam was performed according to the departmental dose-optimization program which includes automated exposure control, adjustment of the mA and/or kV according to patient size and/or use of iterative reconstruction technique. COMPARISON:  Prior CT scan of the head 01/26/2021 FINDINGS: CT HEAD FINDINGS Brain: No evidence of acute infarction, hemorrhage, hydrocephalus, extra-axial collection or mass lesion/mass effect. Mild cerebral cortical volume loss. Tiny remote lacunar infarct in the right cerebellum again noted. Rounded calcification affiliated with the posterior falx measures 1.1 x 1.2 cm. This is essentially unchanged compared to prior. Vascular: No hyperdense vessel or unexpected calcification. Skull: Normal. Negative for fracture or focal lesion. Other: None. CT MAXILLOFACIAL FINDINGS Osseous: No fracture or mandibular dislocation. No destructive process. Orbits: Preseptal periorbital soft tissue edema on the right consistent with contusion. No evidence of injury to the globe or extension retro-orbital. mild exophthalmos bilaterally. Sinuses: Clear. Soft tissues: Right preseptal periorbital soft  tissue contusion. IMPRESSION: CT HEAD 1. No acute intracranial abnormality. 2. Stable cortical atrophy and small remote right cerebellar lacunar infarct. 3. Stable rounded calcification along the left posterior falx likely representing a benign meningioma. CT CSPINE 1. Right preseptal periorbital soft tissue contusion without evidence of globe injury, retro bulb are contusion were facial fracture. 2. Mild exophthalmos bilaterally. Electronically Signed   By: Jacqulynn Cadet M.D.   On: 11/26/2021  08:19    ROS: No recent fever, chills, nausea, vomiting or changes in her appetite.  10 system review was otherwise negative. PE:  Blood pressure 105/80, pulse 85, temperature 98.6 F (37 C), temperature source Oral, resp. rate (!) 22, height '5\' 6"'$  (1.676 m), weight 69.9 kg, SpO2 95 %. Well-developed elderly woman in no apparent distress.  Alert and oriented.  Normal mood and affect.  The right lower extremity is shortened and externally rotated.  Healthy skin over the right hip.  2+ dorsalis pedis pulse.  Normal sensibility to light touch dorsally and plantarly at the forefoot.  Active plantarflexion and dorsiflexion strength at the ankle and toes.  No lymphadenopathy.  Assessment/Plan: Right hip displaced femoral neck fracture -patient will be admitted by the hospitalist service.  Hold blood thinners.  She will need right hip replacement in the coming days.  I will contact Dr. Lyla Glassing.  Nonweightbearing right lower extremity until morning.  Wylene Simmer 11/26/2021, 10:07 AM

## 2021-11-26 NOTE — Assessment & Plan Note (Addendum)
Patient will be admitted to [facility] per orthopedics recommendations. Patient is medically stable for surgical orthopedic procedure. Pain control with oral oxycodone and IV morphine Scheduled acetaminophen and celebrex Continue with home dose of gabapentin.  Per orthopedics recommendations no chemical DVT prophylaxis for today and NPO past midnight.  Patient will need Pt and Ot post operative.

## 2021-11-26 NOTE — ED Notes (Signed)
Patient repositioned and placed on stretcher for comfort

## 2021-11-26 NOTE — Assessment & Plan Note (Addendum)
Hgb is 11,5 plan to follow up post operative, currently with no indication for transfusion. Iron deficiency anemia, continue with iron supplementation.  Reactive leukocytosis, follow up cell count in am.

## 2021-11-26 NOTE — Consult Note (Signed)
Reason for Consult: Right hip pain   Referring Physician:  [name] is an 71 y.o. female.  HPI: 71 y/o female with PMH of chronic pain c/o R hip pain since a fall yesterday.  She takes oxycodone at baseline and c/o severe pain in the right hip.  She has a history of left hip replacement for hip fracture last year.  She denies any history of diabetes.  She is not a smoker.  She lives alone.  She fell while walking from the bedroom to the bathroom.  She denies any loss of consciousness.  She denies any pain elsewhere aside from her normal chronic low back pain.  She does not take blood thinners.  Past Medical History:  Diagnosis Date   Anemia    low iron   Anxiety    Asthma    Bipolar disorder (HCC)    Chronic back pain    Compression fracture of L2 lumbar vertebra (HCC)    DDD (degenerative disc disease)    Depression    Dyspnea    GERD (gastroesophageal reflux disease)    H/O alcohol abuse    28 years sober   H/O suicide attempt    Herpes    History of hiatal hernia    Hypothyroidism    Migraines    Osteoporosis 11/2018   T score of -2.6   Pneumonia    PONV (postoperative nausea and vomiting)    S/P adenoidectomy    S/P tonsillectomy     Past Surgical History:  Procedure Laterality Date   CESAREAN SECTION     COLONOSCOPY     as a teenager   Compression fracture spine     DILATION AND CURETTAGE OF UTERUS     EYE SURGERY Bilateral    CATARACTS REMOVED, NO LENS PLACED   HYSTEROSCOPY     KYPHOPLASTY N/A 12/19/2016   Procedure: THORACIC 12 KYPHOPLASTY; REQUEST 60 MINS AND FLIP ROOM;  Surgeon: Phylliss Bob, MD;  Location: [facility];  Service: Orthopedics;  Laterality: N/A;  THORACIC 12 KYPHOPLASTY; REQUEST 60 MINS AND FLIP ROOM   KYPHOPLASTY N/A 12/11/2017   Procedure: LUMBAR 2 KYPHOPLASTY;  Surgeon: Phylliss Bob, MD;  Location: [facility];  Service: Orthopedics;  Laterality: N/A;   LUMBAR FUSION     TONSILLECTOMY     TOTAL HIP ARTHROPLASTY Left  02/24/2020   Procedure: TOTAL HIP ARTHROPLASTY ANTERIOR APPROACH;  Surgeon: Rod Can, MD;  Location: [facility];  Service: Orthopedics;  Laterality: Left;    Family History  Adopted: Yes    Social History:  reports that she has never smoked. She has never used smokeless tobacco. She reports current alcohol use. She reports that she does not currently use drugs after having used the following drugs: Benzodiazepines and Cocaine.  Allergies:  Allergies  Allergen Reactions   Prednisone Other (See Comments)    Cannot tolerate ORALLY, but can tolerate by shot (interacts with anxiety & bipolarity)   Lithium Carbonate Other (See Comments)    Patient was taken off of this because of a weight gain of 40 pounds   Other Other (See Comments)    Serious reaction to 1st COVID vaccination - unknown type   Sulfa Antibiotics Other (See Comments)    From childhood; reaction not recalled    Medications: I have reviewed the patient's current medications.  Results for orders placed or performed during the hospital encounter of 11/26/21 (from the past 48 hour(s))  Comprehensive metabolic panel  Status: Abnormal   Collection Time: 11/26/21  7:55 AM  Result Value Ref Range   Sodium 133 (L) 135 - 145 mmol/L   Potassium 4.2 3.5 - 5.1 mmol/L   Chloride 100 98 - 111 mmol/L   CO2 22 22 - 32 mmol/L   Glucose, Bld 114 (H) 70 - 99 mg/dL    Comment: Glucose reference range applies only to samples taken after fasting for at least 8 hours.   BUN 18 8 - 23 mg/dL   Creatinine, Ser 0.81 0.44 - 1.00 mg/dL   Calcium 9.8 8.9 - 10.3 mg/dL   Total Protein 6.2 (L) 6.5 - 8.1 g/dL   Albumin 3.8 3.5 - 5.0 g/dL   AST 21 15 - 41 U/L   ALT 18 0 - 44 U/L   Alkaline Phosphatase 61 38 - 126 U/L   Total Bilirubin 0.4 0.3 - 1.2 mg/dL   GFR, Estimated >60 >60 mL/min    Comment: (NOTE) Calculated using the CKD-EPI Creatinine Equation (2021)    Anion gap 11 5 - 15    Comment: Performed at [facility]  [address], [place], [place] [address]  CBC     Status: Abnormal   Collection Time: 11/26/21  7:55 AM  Result Value Ref Range   WBC 13.9 (H) 4.0 - 10.5 K/uL   RBC 4.32 3.87 - 5.11 MIL/uL   Hemoglobin 11.5 (L) 12.0 - 15.0 g/dL   HCT 37.4 36.0 - 46.0 %   MCV 86.6 80.0 - 100.0 fL   MCH 26.6 26.0 - 34.0 pg   MCHC 30.7 30.0 - 36.0 g/dL   RDW 19.5 (H) 11.5 - 15.5 %   Platelets 240 150 - 400 K/uL    Comment: REPEATED TO VERIFY   nRBC 0.0 0.0 - 0.2 %    Comment: Performed at [facility], [place] [address], [place], [place] [address]  CK total and CKMB     Status: None   Collection Time: 11/26/21  7:55 AM  Result Value Ref Range   Total CK 73 38 - 234 U/L   CK, MB 3.1 0.5 - 5.0 ng/mL   Relative Index RELATIVE INDEX IS INVALID 0.0 - 2.5    Comment: WHEN CK < 100 U/L        Performed at [facility] [address], [place], [place] [address]     DG Chest 1 View  Result Date: 11/26/2021 CLINICAL DATA:  Status post fall. 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 Scar versus subsegmental atelectasis noted within the left midlung and left base. No signs of pleural effusion or edema. No airspace opacities identified. Visualized osseous structures appear grossly intact. IMPRESSION: 1. Scar versus platelike atelectasis within the left lung. No signs of pleural effusion or pneumothorax. No airspace opacities. 2. Large hiatal hernia. Electronically Signed   By: Kerby Moors M.D.   On: 11/26/2021 08:33   DG Hip Unilat W or Wo Pelvis 2-3 Views Right  Result Date: 11/26/2021 CLINICAL DATA:  Fall. EXAM: DG HIP (WITH OR WITHOUT PELVIS) 2-3V RIGHT COMPARISON:  03/09/2020 FINDINGS: There is diffuse decreased bone mineralization. Examination demonstrates a displaced subcapital fracture of the right femoral neck with superolateral displacement of the distal fragment. Left hip  arthroplasty intact and unchanged. Fusion hardware over the lumbar spine. Degenerative changes of the spine. IMPRESSION: Displaced subcapital fracture  of the right femoral neck. Electronically Signed   By: Marin Olp M.D.   On: 11/26/2021 08:32   CT HEAD WO CONTRAST  Result Date: 11/26/2021 CLINICAL DATA:  Moderate to severe head and facial trauma EXAM: CT HEAD WITHOUT CONTRAST CT MAXILLOFACIAL WITHOUT CONTRAST TECHNIQUE: Multidetector CT imaging of the head and maxillofacial structures were performed using the standard protocol without intravenous contrast. Multiplanar CT image reconstructions of the maxillofacial structures were also generated. RADIATION DOSE REDUCTION: This exam was performed according to the departmental dose-optimization program which includes automated exposure control, adjustment of the mA and/or kV according to patient size and/or use of iterative reconstruction technique. COMPARISON:  Prior CT scan of the head 01/26/2021 FINDINGS: CT HEAD FINDINGS Brain: No evidence of acute infarction, hemorrhage, hydrocephalus, extra-axial collection or mass lesion/mass effect. Mild cerebral cortical volume loss. Tiny remote lacunar infarct in the right cerebellum again noted. Rounded calcification affiliated with the posterior falx measures 1.1 x 1.2 cm. This is essentially unchanged compared to prior. Vascular: No hyperdense vessel or unexpected calcification. Skull: Normal. Negative for fracture or focal lesion. Other: None. CT MAXILLOFACIAL FINDINGS Osseous: No fracture or mandibular dislocation. No destructive process. Orbits: Preseptal periorbital soft tissue edema on the right consistent with contusion. No evidence of injury to the globe or extension retro-orbital. mild exophthalmos bilaterally. Sinuses: Clear. Soft tissues: Right preseptal periorbital soft tissue contusion. IMPRESSION: CT HEAD 1. No acute intracranial abnormality. 2. Stable cortical atrophy and small remote right cerebellar  lacunar infarct. 3. Stable rounded calcification along the left posterior falx likely representing a benign meningioma. CT CSPINE 1. Right preseptal periorbital soft tissue contusion without evidence of globe injury, retro bulb are contusion were facial fracture. 2. Mild exophthalmos bilaterally. Electronically Signed   By: Jacqulynn Cadet M.D.   On: 11/26/2021 08:19   CT MAXILLOFACIAL WO CONTRAST  Result Date: 11/26/2021 CLINICAL DATA:  Moderate to severe head and facial trauma EXAM: CT HEAD WITHOUT CONTRAST CT MAXILLOFACIAL WITHOUT CONTRAST TECHNIQUE: Multidetector CT imaging of the head and maxillofacial structures were performed using the standard protocol without intravenous contrast. Multiplanar CT image reconstructions of the maxillofacial structures were also generated. RADIATION DOSE REDUCTION: This exam was performed according to the departmental dose-optimization program which includes automated exposure control, adjustment of the mA and/or kV according to patient size and/or use of iterative reconstruction technique. COMPARISON:  Prior CT scan of the head 01/26/2021 FINDINGS: CT HEAD FINDINGS Brain: No evidence of acute infarction, hemorrhage, hydrocephalus, extra-axial collection or mass lesion/mass effect. Mild cerebral cortical volume loss. Tiny remote lacunar infarct in the right cerebellum again noted. Rounded calcification affiliated with the posterior falx measures 1.1 x 1.2 cm. This is essentially unchanged compared to prior. Vascular: No hyperdense vessel or unexpected calcification. Skull: Normal. Negative for fracture or focal lesion. Other: None. CT MAXILLOFACIAL FINDINGS Osseous: No fracture or mandibular dislocation. No destructive process. Orbits: Preseptal periorbital soft tissue edema on the right consistent with contusion. No evidence of injury to the globe or extension retro-orbital. mild exophthalmos bilaterally. Sinuses: Clear. Soft tissues: Right preseptal periorbital soft  tissue contusion. IMPRESSION: CT HEAD 1. No acute intracranial abnormality. 2. Stable cortical atrophy and small remote right cerebellar lacunar infarct. 3. Stable rounded calcification along the left posterior falx likely representing a benign meningioma. CT CSPINE 1. Right preseptal periorbital soft tissue contusion without evidence of globe injury, retro bulb are contusion were facial fracture. 2. Mild exophthalmos bilaterally. Electronically Signed   By: Jacqulynn Cadet M.D.   On: 11/26/2021  08:19    ROS: No recent fever, chills, nausea, vomiting or changes in her appetite.  10 system review was otherwise negative. PE:  Blood pressure 105/80, pulse 85, temperature 98.6 F (37 C), temperature source Oral, resp. rate (!) 22, height '5\' 6"'$  (1.676 m), weight 69.9 kg, SpO2 95 %. Well-developed elderly woman in no apparent distress.  Alert and oriented.  Normal mood and affect.  The right lower extremity is shortened and externally rotated.  Healthy skin over the right hip.  2+ dorsalis pedis pulse.  Normal sensibility to light touch dorsally and plantarly at the forefoot.  Active plantarflexion and dorsiflexion strength at the ankle and toes.  No lymphadenopathy.  Assessment/Plan: Right hip displaced femoral neck fracture -patient will be admitted by the hospitalist service.  Hold blood thinners.  She will need right hip replacement in the coming days.  I will contact Dr. Lyla Glassing.  Nonweightbearing right lower extremity until morning.  Wylene Simmer 11/26/2021, 10:07 AM

## 2021-11-26 NOTE — Assessment & Plan Note (Signed)
Continue antiacid therapy with pantoprazole and famotidine Patient has a large hiatal hernia.

## 2021-11-27 ENCOUNTER — Encounter (HOSPITAL_COMMUNITY): Payer: Self-pay | Admitting: Internal Medicine

## 2021-11-27 ENCOUNTER — Inpatient Hospital Stay (HOSPITAL_COMMUNITY): Payer: Medicare HMO

## 2021-11-27 ENCOUNTER — Inpatient Hospital Stay (HOSPITAL_COMMUNITY): Payer: Medicare HMO | Admitting: Anesthesiology

## 2021-11-27 ENCOUNTER — Other Ambulatory Visit: Payer: Self-pay

## 2021-11-27 ENCOUNTER — Encounter (HOSPITAL_COMMUNITY)
Admission: EM | Disposition: A | Discharge status: Skilled Nursing Facility | Payer: Self-pay | Source: Home / Self Care | Attending: Internal Medicine

## 2021-11-27 DIAGNOSIS — F418 Other specified anxiety disorders: Secondary | ICD-10-CM

## 2021-11-27 DIAGNOSIS — I10 Essential (primary) hypertension: Secondary | ICD-10-CM

## 2021-11-27 DIAGNOSIS — J449 Chronic obstructive pulmonary disease, unspecified: Secondary | ICD-10-CM

## 2021-11-27 DIAGNOSIS — S72001A Fracture of unspecified part of neck of right femur, initial encounter for closed fracture: Secondary | ICD-10-CM

## 2021-11-27 HISTORY — PX: TOTAL HIP ARTHROPLASTY: SHX124

## 2021-11-27 LAB — CBC
HCT: 35.4 % — ABNORMAL LOW (ref 36.0–46.0)
Hemoglobin: 10.7 g/dL — ABNORMAL LOW (ref 12.0–15.0)
MCH: 26.8 pg (ref 26.0–34.0)
MCHC: 30.2 g/dL (ref 30.0–36.0)
MCV: 88.7 fL (ref 80.0–100.0)
Platelets: 281 10*3/uL (ref 150–400)
RBC: 3.99 MIL/uL (ref 3.87–5.11)
RDW: 19.5 % — ABNORMAL HIGH (ref 11.5–15.5)
WBC: 11.2 10*3/uL — ABNORMAL HIGH (ref 4.0–10.5)
nRBC: 0 % (ref 0.0–0.2)

## 2021-11-27 LAB — URINALYSIS, ROUTINE W REFLEX MICROSCOPIC
Bilirubin Urine: NEGATIVE
Glucose, UA: NEGATIVE mg/dL
Hgb urine dipstick: NEGATIVE
Ketones, ur: NEGATIVE mg/dL
Leukocytes,Ua: NEGATIVE
Nitrite: NEGATIVE
Protein, ur: NEGATIVE mg/dL
Specific Gravity, Urine: 1.02 (ref 1.005–1.030)
pH: 5.5 (ref 5.0–8.0)

## 2021-11-27 LAB — BASIC METABOLIC PANEL
Anion gap: 6 (ref 5–15)
BUN: 20 mg/dL (ref 8–23)
CO2: 27 mmol/L (ref 22–32)
Calcium: 9.4 mg/dL (ref 8.9–10.3)
Chloride: 102 mmol/L (ref 98–111)
Creatinine, Ser: 0.68 mg/dL (ref 0.44–1.00)
GFR, Estimated: >60 mL/min (ref >60)
Glucose, Bld: 135 mg/dL — ABNORMAL HIGH (ref 70–99)
Potassium: 3.9 mmol/L (ref 3.5–5.1)
Sodium: 135 mmol/L (ref 135–145)

## 2021-11-27 LAB — SURGICAL PCR SCREEN
MRSA, PCR: NEGATIVE
Staphylococcus aureus: NEGATIVE

## 2021-11-27 SURGERY — ARTHROPLASTY, HIP, TOTAL, ANTERIOR APPROACH
Anesthesia: Spinal | Site: Hip | Laterality: Right

## 2021-11-27 MED ORDER — LORAZEPAM 0.5 MG PO TABS
0.5000 mg | ORAL_TABLET | Freq: Four times a day (QID) | ORAL | Status: DC | PRN
  Administered 2021-11-27 – 2021-12-01 (×4): 0.5 mg via ORAL
  Filled 2021-11-27 (×4): qty 1

## 2021-11-27 MED ORDER — TRANEXAMIC ACID-NACL 1000-0.7 MG/100ML-% IV SOLN: 1000.0000 mg | INTRAVENOUS | Status: DC

## 2021-11-27 MED ORDER — SUCCINYLCHOLINE CHLORIDE 200 MG/10ML IV SOSY
PREFILLED_SYRINGE | INTRAVENOUS | Status: AC
  Filled 2021-11-27: qty 10

## 2021-11-27 MED ORDER — PROPOFOL 10 MG/ML IV BOLUS
INTRAVENOUS | Status: DC | PRN
  Administered 2021-11-27 (×5): 20 mg via INTRAVENOUS

## 2021-11-27 MED ORDER — ISOPROPYL ALCOHOL 70 % SOLN
Status: AC
Start: 2021-11-27 — End: ?
  Filled 2021-11-27: qty 480

## 2021-11-27 MED ORDER — PHENYLEPHRINE 80 MCG/ML (10ML) SYRINGE FOR IV PUSH (FOR BLOOD PRESSURE SUPPORT)
PREFILLED_SYRINGE | INTRAVENOUS | Status: DC | PRN
  Administered 2021-11-27: 160 ug via INTRAVENOUS

## 2021-11-27 MED ORDER — PROPOFOL 10 MG/ML IV BOLUS
INTRAVENOUS | Status: AC
  Filled 2021-11-27: qty 20

## 2021-11-27 MED ORDER — ISOPROPYL ALCOHOL 70 % SOLN
Status: DC | PRN
  Administered 2021-11-27: 1 via TOPICAL

## 2021-11-27 MED ORDER — LACTATED RINGERS IV SOLN: INTRAVENOUS | Status: DC

## 2021-11-27 MED ORDER — DEXAMETHASONE SODIUM PHOSPHATE 10 MG/ML IJ SOLN
INTRAMUSCULAR | Status: DC | PRN
  Administered 2021-11-27: 8 mg via INTRAVENOUS

## 2021-11-27 MED ORDER — SODIUM CHLORIDE (PF) 0.9 % IJ SOLN
INTRAMUSCULAR | Status: DC | PRN
  Administered 2021-11-27: 30 mL

## 2021-11-27 MED ORDER — DEXAMETHASONE SODIUM PHOSPHATE 10 MG/ML IJ SOLN
INTRAMUSCULAR | Status: AC
  Filled 2021-11-27: qty 1

## 2021-11-27 MED ORDER — ACETAMINOPHEN 500 MG PO TABS
1000.0000 mg | ORAL_TABLET | Freq: Once | ORAL | Status: AC
  Administered 2021-11-27: 1000 mg via ORAL
  Filled 2021-11-27: qty 2

## 2021-11-27 MED ORDER — ONDANSETRON HCL 4 MG/2ML IJ SOLN: 4.0000 mg | Freq: Once | INTRAMUSCULAR | Status: DC | PRN

## 2021-11-27 MED ORDER — SODIUM CHLORIDE 0.9 % IV SOLN: INTRAVENOUS | Status: DC

## 2021-11-27 MED ORDER — LORAZEPAM 0.5 MG PO TABS
0.5000 mg | ORAL_TABLET | Freq: Four times a day (QID) | ORAL | Status: AC | PRN
  Administered 2021-11-27: 0.5 mg via ORAL
  Filled 2021-11-27: qty 1

## 2021-11-27 MED ORDER — ALBUMIN HUMAN 5 % IV SOLN: INTRAVENOUS | Status: DC | PRN

## 2021-11-27 MED ORDER — METHOCARBAMOL 500 MG IVPB - SIMPLE MED: 500.0000 mg | Freq: Four times a day (QID) | INTRAVENOUS | Status: DC | PRN

## 2021-11-27 MED ORDER — BUPIVACAINE-EPINEPHRINE (PF) 0.5% -1:200000 IJ SOLN
INTRAMUSCULAR | Status: DC | PRN
  Administered 2021-11-27: 30 mL

## 2021-11-27 MED ORDER — ROCURONIUM BROMIDE 10 MG/ML (PF) SYRINGE
PREFILLED_SYRINGE | INTRAVENOUS | Status: AC
  Filled 2021-11-27: qty 10

## 2021-11-27 MED ORDER — OXYCODONE HCL 5 MG PO TABS: 5.0000 mg | ORAL_TABLET | Freq: Once | ORAL | Status: DC | PRN

## 2021-11-27 MED ORDER — TRANEXAMIC ACID-NACL 1000-0.7 MG/100ML-% IV SOLN
INTRAVENOUS | Status: AC
  Filled 2021-11-27: qty 100

## 2021-11-27 MED ORDER — METHOCARBAMOL 500 MG PO TABS
500.0000 mg | ORAL_TABLET | Freq: Four times a day (QID) | ORAL | Status: DC | PRN
  Administered 2021-11-27 – 2021-12-01 (×8): 500 mg via ORAL
  Filled 2021-11-27 (×8): qty 1

## 2021-11-27 MED ORDER — SODIUM CHLORIDE (PF) 0.9 % IJ SOLN
INTRAMUSCULAR | Status: AC
  Filled 2021-11-27: qty 30

## 2021-11-27 MED ORDER — OXYCODONE HCL 5 MG/5ML PO SOLN: 5.0000 mg | Freq: Once | ORAL | Status: DC | PRN

## 2021-11-27 MED ORDER — CEFAZOLIN SODIUM-DEXTROSE 2-4 GM/100ML-% IV SOLN
2.0000 g | Freq: Four times a day (QID) | INTRAVENOUS | Status: AC
  Administered 2021-11-27 – 2021-11-28 (×2): 2 g via INTRAVENOUS
  Filled 2021-11-27 (×2): qty 100

## 2021-11-27 MED ORDER — SODIUM CHLORIDE 0.9 % IR SOLN
Status: DC | PRN
  Administered 2021-11-27 (×2): 1000 mL

## 2021-11-27 MED ORDER — PROPOFOL 1000 MG/100ML IV EMUL
INTRAVENOUS | Status: AC
  Filled 2021-11-27: qty 100

## 2021-11-27 MED ORDER — ALBUTEROL SULFATE (2.5 MG/3ML) 0.083% IN NEBU
INHALATION_SOLUTION | RESPIRATORY_TRACT | Status: AC
  Filled 2021-11-27: qty 3

## 2021-11-27 MED ORDER — FENTANYL CITRATE (PF) 100 MCG/2ML IJ SOLN
INTRAMUSCULAR | Status: AC
  Filled 2021-11-27: qty 2

## 2021-11-27 MED ORDER — LIDOCAINE 2% (20 MG/ML) 5 ML SYRINGE
INTRAMUSCULAR | Status: DC | PRN
  Administered 2021-11-27: 40 mg via INTRAVENOUS

## 2021-11-27 MED ORDER — BUPIVACAINE-EPINEPHRINE (PF) 0.5% -1:200000 IJ SOLN
INTRAMUSCULAR | Status: AC
  Filled 2021-11-27: qty 30

## 2021-11-27 MED ORDER — CHLORHEXIDINE GLUCONATE 0.12 % MT SOLN
15.0000 mL | Freq: Once | OROMUCOSAL | Status: AC
  Administered 2021-11-27: 15 mL via OROMUCOSAL

## 2021-11-27 MED ORDER — PHENYLEPHRINE HCL-NACL 20-0.9 MG/250ML-% IV SOLN
INTRAVENOUS | Status: DC | PRN
  Administered 2021-11-27: 40 ug/min via INTRAVENOUS

## 2021-11-27 MED ORDER — ONDANSETRON HCL 4 MG/2ML IJ SOLN
INTRAMUSCULAR | Status: AC
  Filled 2021-11-27: qty 2

## 2021-11-27 MED ORDER — CEFAZOLIN SODIUM-DEXTROSE 2-4 GM/100ML-% IV SOLN
2.0000 g | Freq: Once | INTRAVENOUS | Status: AC
  Administered 2021-11-27: 2 g via INTRAVENOUS

## 2021-11-27 MED ORDER — CEFAZOLIN SODIUM-DEXTROSE 2-4 GM/100ML-% IV SOLN: 2.0000 g | INTRAVENOUS | Status: DC

## 2021-11-27 MED ORDER — BUPIVACAINE HCL (PF) 0.5 % IJ SOLN
INTRAMUSCULAR | Status: DC | PRN
  Administered 2021-11-27: 3 mL

## 2021-11-27 MED ORDER — CEFAZOLIN SODIUM-DEXTROSE 2-4 GM/100ML-% IV SOLN
INTRAVENOUS | Status: AC
  Filled 2021-11-27: qty 100

## 2021-11-27 MED ORDER — ONDANSETRON HCL 4 MG/2ML IJ SOLN
INTRAMUSCULAR | Status: DC | PRN
  Administered 2021-11-27: 4 mg via INTRAVENOUS

## 2021-11-27 MED ORDER — PROPOFOL 500 MG/50ML IV EMUL
INTRAVENOUS | Status: DC | PRN
  Administered 2021-11-27: 85 ug/kg/min via INTRAVENOUS

## 2021-11-27 MED ORDER — KETOROLAC TROMETHAMINE 30 MG/ML IJ SOLN
INTRAMUSCULAR | Status: AC
  Filled 2021-11-27: qty 1

## 2021-11-27 MED ORDER — KETOROLAC TROMETHAMINE 30 MG/ML IJ SOLN
INTRAMUSCULAR | Status: DC | PRN
  Administered 2021-11-27: 30 mg

## 2021-11-27 MED ORDER — TRANEXAMIC ACID-NACL 1000-0.7 MG/100ML-% IV SOLN
1000.0000 mg | INTRAVENOUS | Status: AC
  Administered 2021-11-27: 1000 mg via INTRAVENOUS

## 2021-11-27 MED ORDER — FENTANYL CITRATE PF 50 MCG/ML IJ SOSY: 25.0000 ug | PREFILLED_SYRINGE | INTRAMUSCULAR | Status: DC | PRN

## 2021-11-27 MED ORDER — POVIDONE-IODINE 10 % EX SWAB: 2.0000 | Freq: Once | CUTANEOUS | Status: DC

## 2021-11-27 MED ORDER — AMISULPRIDE (ANTIEMETIC) 5 MG/2ML IV SOLN: 10.0000 mg | Freq: Once | INTRAVENOUS | Status: DC | PRN

## 2021-11-27 MED ORDER — FENTANYL CITRATE (PF) 100 MCG/2ML IJ SOLN
INTRAMUSCULAR | Status: DC | PRN
  Administered 2021-11-27 (×2): 50 ug via INTRAVENOUS

## 2021-11-27 SURGICAL SUPPLY — 60 items
ACE SHELL 3H 52 E HIP (Shell) ×1 IMPLANT
ADH SKN CLS APL DERMABOND .7 (GAUZE/BANDAGES/DRESSINGS) ×2
APL PRP STRL LF DISP 70% ISPRP (MISCELLANEOUS) ×1
BAG COUNTER SPONGE SURGICOUNT (BAG) IMPLANT
BAG DECANTER FOR FLEXI CONT (MISCELLANEOUS) IMPLANT
BAG SPEC THK2 15X12 ZIP CLS (MISCELLANEOUS)
BAG SPNG CNTER NS LX DISP (BAG)
BAG ZIPLOCK 12X15 (MISCELLANEOUS) IMPLANT
CHLORAPREP W/TINT 26 (MISCELLANEOUS) ×2 IMPLANT
COVER PERINEAL POST (MISCELLANEOUS) ×2 IMPLANT
COVER SURGICAL LIGHT HANDLE (MISCELLANEOUS) ×2 IMPLANT
DERMABOND ADVANCED .7 DNX12 (GAUZE/BANDAGES/DRESSINGS) ×4 IMPLANT
DRAPE IMP U-DRAPE 54X76 (DRAPES) ×2 IMPLANT
DRAPE SHEET LG 3/4 BI-LAMINATE (DRAPES) ×6 IMPLANT
DRAPE STERI IOBAN 125X83 (DRAPES) ×2 IMPLANT
DRAPE U-SHAPE 47X51 STRL (DRAPES) ×4 IMPLANT
DRSG AQUACEL AG ADV 3.5X10 (GAUZE/BANDAGES/DRESSINGS) ×2 IMPLANT
ELECT REM PT RETURN 15FT ADLT (MISCELLANEOUS) ×2 IMPLANT
GAUZE SPONGE 4X4 12PLY STRL (GAUZE/BANDAGES/DRESSINGS) ×2 IMPLANT
GLOVE BIO SURGEON STRL SZ8.5 (GLOVE) ×4 IMPLANT
GLOVE BIOGEL M 7.0 STRL (GLOVE) ×2 IMPLANT
GLOVE BIOGEL PI IND STRL 7.5 (GLOVE) ×2 IMPLANT
GLOVE BIOGEL PI IND STRL 8.5 (GLOVE) ×2 IMPLANT
GLOVE SURG LX STRL 7.5 STRW (GLOVE) ×4 IMPLANT
GOWN SPEC L3 XXLG W/TWL (GOWN DISPOSABLE) ×2 IMPLANT
GOWN STRL REUS W/ TWL XL LVL3 (GOWN DISPOSABLE) ×2 IMPLANT
GOWN STRL REUS W/TWL XL LVL3 (GOWN DISPOSABLE) ×1
HANDPIECE INTERPULSE COAX TIP (DISPOSABLE) ×1
HEAD CERAMIC BIOLOX 36MM (Head) IMPLANT
HIP SLEEVE BIOLOX -6MM OFFSET (Sleeve) ×1 IMPLANT
HOLDER FOLEY CATH W/STRAP (MISCELLANEOUS) ×2 IMPLANT
HOOD PEEL AWAY FLYTE STAYCOOL (MISCELLANEOUS) ×6 IMPLANT
KIT TURNOVER KIT A (KITS) IMPLANT
LINER ACE G7 HIGH 36 SZ E (Liner) IMPLANT
MANIFOLD NEPTUNE II (INSTRUMENTS) ×2 IMPLANT
MARKER SKIN DUAL TIP RULER LAB (MISCELLANEOUS) ×2 IMPLANT
NDL SAFETY ECLIP 18X1.5 (MISCELLANEOUS) ×2 IMPLANT
NDL SPNL 18GX3.5 QUINCKE PK (NEEDLE) ×2 IMPLANT
NEEDLE SPNL 18GX3.5 QUINCKE PK (NEEDLE) ×1 IMPLANT
PACK ANTERIOR HIP CUSTOM (KITS) ×2 IMPLANT
PENCIL SMOKE EVACUATOR (MISCELLANEOUS) IMPLANT
SAW OSC TIP CART 19.5X105X1.3 (SAW) ×2 IMPLANT
SEALER BIPOLAR AQUA 6.0 (INSTRUMENTS) ×2 IMPLANT
SET HNDPC FAN SPRY TIP SCT (DISPOSABLE) ×2 IMPLANT
SHELL ACETAB 3H 52 E HIP (Shell) IMPLANT
SLEEVE HIP BIOLOX -6MM OFFSET (Sleeve) IMPLANT
SOLUTION PRONTOSAN WOUND 350ML (IRRIGATION / IRRIGATOR) ×2 IMPLANT
SPIKE FLUID TRANSFER (MISCELLANEOUS) ×2 IMPLANT
STEM FEMORAL 17X154 (Stem) IMPLANT
SUT MNCRL AB 3-0 PS2 18 (SUTURE) ×2 IMPLANT
SUT MON AB 2-0 CT1 36 (SUTURE) ×2 IMPLANT
SUT STRATAFIX PDO 1 14 VIOLET (SUTURE) ×1
SUT STRATFX PDO 1 14 VIOLET (SUTURE) ×1
SUT VIC AB 2-0 CT1 27 (SUTURE)
SUT VIC AB 2-0 CT1 TAPERPNT 27 (SUTURE) IMPLANT
SUTURE STRATFX PDO 1 14 VIOLET (SUTURE) ×2 IMPLANT
SYR 3ML LL SCALE MARK (SYRINGE) ×2 IMPLANT
TRAY FOLEY MTR SLVR 16FR STAT (SET/KITS/TRAYS/PACK) IMPLANT
TUBE SUCTION HIGH CAP CLEAR NV (SUCTIONS) ×2 IMPLANT
WATER STERILE IRR 1000ML POUR (IV SOLUTION) ×2 IMPLANT

## 2021-11-27 NOTE — Op Note (Signed)
OPERATIVE REPORT  SURGEON: Rod Can, MD   ASSISTANT: Larene Pickett, PA-C  PREOPERATIVE DIAGNOSIS: Displaced Right femoral neck fracture.   POSTOPERATIVE DIAGNOSIS: Displaced Right femoral neck fracture.   PROCEDURE: Right total hip arthroplasty, anterior approach.   IMPLANTS: Biomet Taperloc Reduced Distal stem, size 17x152m, high offset. Biomet G7 OsseoTi Cup, size 52 mm. Biomet Vivacit-E liner, size 36 mm, E, neutral. Biomet Biolox ceramic head ball, size 36 - 6 mm.  ANESTHESIA:  MAC, Regional, and Spinal  ANTIBIOTICS: 2g ancef.  ESTIMATED BLOOD LOSS:-400 mL    DRAINS: None.  COMPLICATIONS: None   CONDITION: PACU - hemodynamically stable.   BRIEF CLINICAL NOTE: Victoria DESKINSis a 71y.o. female with a displaced Right femoral neck fracture. The patient was admitted to the hospitalist service and underwent perioperative risk stratification and medical optimization. The risks, benefits, and alternatives to total hip arthroplasty were explained, and the patient elected to proceed.  PROCEDURE IN DETAIL: The patient was taken to the operating room and general anesthesia was induced on the hospital bed.  The patient was then positioned on the [place] table.  All bony prominences were well padded.  The hip was prepped and draped in the normal sterile surgical fashion.  A time-out was called verifying side and site of surgery. Antibiotics were given within 60 minutes of beginning the procedure.   Bikini incision was made, and the direct anterior approach to the hip was performed through the Hueter interval.  Lateral femoral circumflex vessels were treated with the Auqumantys. The anterior capsule was exposed and an inverted T capsulotomy was made.  Fracture hematoma was encountered and evacuated. The patient was found to have a comminuted Right subcapital femoral neck fracture.  I freshened the femoral neck cut with a saw.  I removed the femoral neck fragment.  A corkscrew was placed  into the head and the head was removed.  This was passed to the back table and was measured. The pubofemoral ligament was released subperiosteally to the lesser trochanter.  Acetabular exposure was achieved, and the pulvinar and labrum were excised. Sequential reaming of the acetabulum was then performed up to a size 51 mm reamer under direct visulization. A 52 mm cup was then opened and impacted into place at approximately 40 degrees of abduction and 20 degrees of anteversion. The final polyethylene liner was impacted into place and acetabular osteophytes were removed.    I then gained femoral exposure taking care to protect the abductors and greater trochanter.  This was performed using standard external rotation, extension, and adduction.  A cookie cutter was used to enter the femoral canal, and then the femoral canal finder was placed.  Sequential broaching was performed up to a size 17.  Calcar planer was used on the femoral neck remnant.  I placed a high offset neck and a trial head ball.  The hip was reduced.  Leg lengths and offset were checked fluoroscopically.  The hip was dislocated and trial components were removed.  The final implants were placed, and the hip was reduced.  Fluoroscopy was used to confirm component position and leg lengths.  At 90 degrees of external rotation and full extension, the hip was stable to an anterior directed force.   The wound was copiously irrigated with Irrisept solution and normal saline using pule lavage.  Marcaine solution was injected into the periarticular soft tissue.  The wound was closed in layers using #1 Stratafix for the fascia, 2-0 Vicryl for the subcutaneous fat, 2-0  Monocryl for the deep dermal layer,and staples + Dermabond for the skin.  Once the glue was fully dried, an Aquacell Ag dressing was applied.  The patient was transported to the recovery room in stable condition.  Sponge, needle, and instrument counts were correct at the end of the case x2.   The patient tolerated the procedure well and there were no known complications.  Please note that a surgical assistant was a medical necessity for this procedure to perform it in a safe and expeditious manner. Assistant was necessary to provide appropriate retraction of vital neurovascular structures, to prevent femoral fracture, and to allow for anatomic placement of the prosthesis.

## 2021-11-27 NOTE — Transfer of Care (Signed)
Immediate Anesthesia Transfer of Care Note  Patient: Victoria Levine  Procedure(s) Performed: TOTAL HIP ARTHROPLASTY ANTERIOR APPROACH (Right: Hip)  Patient Location: PACU  Anesthesia Type:Spinal  Level of Consciousness: awake and alert   Airway & Oxygen Therapy: Patient Spontanous Breathing and Patient connected to face mask oxygen  Post-op Assessment: Report given to RN and Post -op Vital signs reviewed and stable  Post vital signs: Reviewed and stable  Last Vitals:  Vitals Value Taken Time  BP    Temp    Pulse 82 11/27/21 1853  Resp 13 11/27/21 1853  SpO2 83 % 11/27/21 1853  Vitals shown include unvalidated device data.  Last Pain:  Vitals:   11/27/21 1552  TempSrc: Oral  PainSc:       Patients Stated Pain Goal: 2 (46/19/01 2224)  Complications: No notable events documented.

## 2021-11-27 NOTE — Anesthesia Procedure Notes (Signed)
Spinal  Patient location during procedure: OR Reason for block: surgical anesthesia Staffing Performed: anesthesiologist  Anesthesiologist: Lidia Collum, MD Performed by: Lidia Collum, MD Authorized by: Lidia Collum, MD   Preanesthetic Checklist Completed: patient identified, IV checked, risks and benefits discussed, surgical consent, monitors and equipment checked, pre-op evaluation and timeout performed Spinal Block Patient position: sitting Prep: DuraPrep and site prepped and draped Patient monitoring: continuous pulse ox, blood pressure and heart rate Approach: midline Location: L3-4 Injection technique: single-shot Needle Needle type: Quincke  Needle gauge: 22 G Needle length: 10 cm Assessment Events: CSF return Additional Notes Functioning IV was confirmed and monitors were applied. Sterile prep and drape, including hand hygiene and sterile gloves were used. The patient was positioned and the spine was prepped. The skin was anesthetized with lidocaine.  Free flow of clear CSF was obtained prior to injecting local anesthetic into the CSF. The needle was carefully withdrawn. The patient tolerated the procedure well. Several attempts at ~L3-5 with both 24 ga Pencan and 22ga Tuohy.

## 2021-11-27 NOTE — Progress Notes (Signed)
PROGRESS NOTE    Victoria Levine  WJX:[id number] DOB: 08-Oct-1950 DOA: 11/26/2021 PCP: [name], MD     Brief Narrative:  Victoria Levine is a 71 y.o. female with medical history significant of hypothyroidism, hypertension and depression who presented after suffering of a mechanical fall. Yesterday (11/25/21) around 10 am she was walking from her restroom to her bedroom when she slipped and fell, landing on her right hip. No significant head trauma and no loss of consciousness. Post fall patient had severe pain at her right hip, 10/10, sharp in nature, worse with movement and with no radiation. She was not able to stand up and remained on the floor until the following day at 6:30 am when she finally was able to reach her phone and called EMS. She slowly crawled into her bedroom to get to her phone.  She was found to have right hip fracture.  New events last 24 hours / Subjective: Complaining of severe pain as well as anxiety  Assessment & Plan:   Principal Problem:   Hip fracture ([place]) Active Problems:   Acute hypoxemic respiratory failure (HCC)   Hyponatremia   Chronic anemia   Depression   GERD (gastroesophageal reflux disease)   Hypothyroidism   Right hip fracture -Orthopedic surgery consulted, planning for OR today -Pain control -She will need PT OT evaluation postoperatively  Acute hypoxic respiratory failure -CTA chest negative for PE -On room air this morning during my examination  Mood disorder -Oxcarbazepine, bupropion, fluoxetine, quetiapine -Ativan as needed for anxiety  Hypothyroidism -Synthroid  DVT prophylaxis:  SCDs Start: 11/26/21 1012  Code Status: Full code Family Communication: No family at bedside Disposition Plan:  Status is: Inpatient Remains inpatient appropriate because: Surgery today  Antimicrobials:  Anti-infectives (From admission, onward)    None        Objective: Vitals:   11/26/21 1404 11/26/21 2041 11/26/21 2344  11/27/21 0538  BP: 136/86 (!) 152/91 (!) 156/95 (!) 134/99  Pulse: 66 76 76 78  Resp: '18 16 16 18  '$ Temp: 98.2 F (36.8 C) 98.2 F (36.8 C) 98.7 F (37.1 C) 98.2 F (36.8 C)  TempSrc: Oral Oral Oral Oral  SpO2: 91% 96% 92% 93%  Weight:      Height:        Intake/Output Summary (Last 24 hours) at 11/27/2021 1230 Last data filed at 11/27/2021 0600 Gross per 24 hour  Intake 1060.51 ml  Output 450 ml  Net 610.51 ml   Filed Weights   11/26/21 0839  Weight: 69.9 kg    Examination:  General exam: Appears very anxious and uncomfortable Respiratory system: Clear to auscultation. Respiratory effort normal. No respiratory distress. No conversational dyspnea.  On room air Cardiovascular system: S1 & S2 heard, RRR. No murmurs. No pedal edema. Gastrointestinal system: Abdomen is nondistended, soft and nontender. Normal bowel sounds heard. Central nervous system: Alert and oriented. Extremities: Symmetric in appearance  Skin: No rashes, lesions or ulcers on exposed skin   Data Reviewed: I have personally reviewed following labs and imaging studies  CBC: Recent Labs  Lab 11/26/21 0755 11/27/21 0325  WBC 13.9* 11.2*  HGB 11.5* 10.7*  HCT 37.4 35.4*  MCV 86.6 88.7  PLT 240 213   Basic Metabolic Panel: Recent Labs  Lab 11/26/21 0755 11/27/21 0325  NA 133* 135  K 4.2 3.9  CL 100 102  CO2 22 27  GLUCOSE 114* 135*  BUN 18 20  CREATININE 0.81 0.68  CALCIUM 9.8 9.4  GFR: Estimated Creatinine Clearance: 59.5 mL/min (by C-G formula based on SCr of 0.68 mg/dL). Liver Function Tests: Recent Labs  Lab 11/26/21 0755  AST 21  ALT 18  ALKPHOS 61  BILITOT 0.4  PROT 6.2*  ALBUMIN 3.8   No results for input(s): "LIPASE", "AMYLASE" in the last 168 hours. No results for input(s): "AMMONIA" in the last 168 hours. Coagulation Profile: No results for input(s): "INR", "PROTIME" in the last 168 hours. Cardiac Enzymes: Recent Labs  Lab 11/26/21 0755  CKTOTAL 73  CKMB 3.1    BNP (last 3 results) No results for input(s): "PROBNP" in the last 8760 hours. HbA1C: No results for input(s): "HGBA1C" in the last 72 hours. CBG: No results for input(s): "GLUCAP" in the last 168 hours. Lipid Profile: No results for input(s): "CHOL", "HDL", "LDLCALC", "TRIG", "CHOLHDL", "LDLDIRECT" in the last 72 hours. Thyroid Function Tests: No results for input(s): "TSH", "T4TOTAL", "FREET4", "T3FREE", "THYROIDAB" in the last 72 hours. Anemia Panel: No results for input(s): "VITAMINB12", "FOLATE", "FERRITIN", "TIBC", "IRON", "RETICCTPCT" in the last 72 hours. Sepsis Labs: No results for input(s): "PROCALCITON", "LATICACIDVEN" in the last 168 hours.  Recent Results (from the past 240 hour(s))  Surgical pcr screen     Status: None   Collection Time: 11/26/21 11:23 PM   Specimen: Nasal Mucosa; Nasal Swab  Result Value Ref Range Status   MRSA, PCR NEGATIVE NEGATIVE Final   Staphylococcus aureus NEGATIVE NEGATIVE Final    Comment: (NOTE) The Xpert SA Assay (FDA approved for NASAL specimens in patients 87 years of age and older), is one component of a comprehensive surveillance program. It is not intended to diagnose infection nor to guide or monitor treatment. Performed at [facility], [place] [address] [place], [place] [address]       Radiology Studies: DG Knee Right Port  Result Date: 11/27/2021 CLINICAL DATA:  Closed displaced fracture right femoral neck. EXAM: PORTABLE RIGHT KNEE - 1-2 VIEW COMPARISON:  Correlation with pelvis and right hip radiographs 11/26/2021 FINDINGS: Mild medial joint space narrowing and peripheral osteophytosis. Mild patella alta. Minimal superior and inferior patellar degenerative osteophytes. No joint effusion. No acute fracture or dislocation. IMPRESSION: Mild patella alta. Mild medial compartment and minimal patellofemoral compartment osteoarthritis. Electronically Signed   By: Yvonne Kendall M.D.   On: 11/27/2021 08:41   CT  Angio Chest PE W and/or Wo Contrast  Result Date: 11/26/2021 CLINICAL DATA:  Suspect pulmonary embolism.  Unknown D-dimer. EXAM: CT ANGIOGRAPHY CHEST WITH CONTRAST TECHNIQUE: Multidetector CT imaging of the chest was performed using the standard protocol during bolus administration of intravenous contrast. Multiplanar CT image reconstructions and MIPs were obtained to evaluate the vascular anatomy. RADIATION DOSE REDUCTION: This exam was performed according to the departmental dose-optimization program which includes automated exposure control, adjustment of the mA and/or kV according to patient size and/or use of iterative reconstruction technique. CONTRAST:  106m OMNIPAQUE IOHEXOL 350 MG/ML SOLN COMPARISON:  Chest x-ray 11/26/2021 CT thoracic spine 11/26/2016 FINDINGS: Cardiovascular: Borderline cardiomegaly. Prominent right pericardiac fat pad. Thoracic aorta is normal in caliber. Minimal calcified plaque over the descending thoracic aorta. Pulmonary arterial system is adequately opacified and demonstrates no evidence of emboli. Remaining vascular structures are unremarkable. Mediastinum/Nodes: No mediastinal or hilar adenopathy. Moderate to large hiatal hernia. Prominent right pericardiac fat. Lungs/Pleura: Lungs are adequately inflated with mild atelectasis/scarring over the right middle lobe adjacent the prominent right pericardiac fat pad. Minimal atelectatic change over the left lung base adjacent the lateral aspect of the  large hiatal hernia. No acute airspace process or effusion. Airways are otherwise unremarkable. Upper Abdomen: Calcified plaque over the abdominal aorta. Mild diverticulosis of the colon. No acute findings. Musculoskeletal: Previous kyphoplasties of T12 and L2. Stable compression deformity of L1. Moderate compression fracture of T8 and severe compression fracture of T3 likely chronic, although new since 2018. Review of the MIP images confirms the above findings. IMPRESSION: 1. No  evidence of pulmonary embolism. No acute cardiopulmonary disease. 2. Moderate to large hiatal hernia. Prominent right pericardiac fat pad. 3. Multiple thoracic compression fractures as described likely chronic, although new since 2018. Chronic lumbar compression fractures. 4. Colonic diverticulosis. 5. Aortic atherosclerosis. Aortic Atherosclerosis (ICD10-I70.0). Electronically Signed   By: Marin Olp M.D.   On: 11/26/2021 10:53   CT Cervical Spine Wo Contrast  Result Date: 11/26/2021 CLINICAL DATA:  Neck trauma (Age >= 65y) EXAM: CT CERVICAL SPINE WITHOUT CONTRAST TECHNIQUE: Multidetector CT imaging of the cervical spine was performed without intravenous contrast. Multiplanar CT image reconstructions were also generated. RADIATION DOSE REDUCTION: This exam was performed according to the departmental dose-optimization program which includes automated exposure control, adjustment of the mA and/or kV according to patient size and/or use of iterative reconstruction technique. COMPARISON:  None Available. FINDINGS: Alignment: Facet joints are aligned without dislocation or traumatic listhesis. Dens and lateral masses are aligned. Degenerative grade 1 anterolisthesis of C3 on C4. Skull base and vertebrae: No acute fracture. No primary bone lesion or focal pathologic process. Soft tissues and spinal canal: No prevertebral fluid or swelling. No visible canal hematoma. Disc levels: Mild disc height loss, most pronounced at C5-6. Moderate-advanced multilevel facet arthropathy most severe at C3-4 on the left. Upper chest: Negative. Other: None. IMPRESSION: No acute fracture or traumatic listhesis of the cervical spine. Electronically Signed   By: Davina Poke D.O.   On: 11/26/2021 10:48   DG Chest 1 View  Result Date: 11/26/2021 CLINICAL DATA:  Status post fall. EXAM: CHEST  1 VIEW COMPARISON:  10/20/2021 FINDINGS: Stable cardiomediastinal contours including large hiatal hernia and large Morgagni hernia at the  medial right lung base. Scar versus subsegmental atelectasis noted within the left midlung and left base. No signs of pleural effusion or edema. No airspace opacities identified. Visualized osseous structures appear grossly intact. IMPRESSION: 1. Scar versus platelike atelectasis within the left lung. No signs of pleural effusion or pneumothorax. No airspace opacities. 2. Large hiatal hernia. Electronically Signed   By: Kerby Moors M.D.   On: 11/26/2021 08:33   DG Hip Unilat W or Wo Pelvis 2-3 Views Right  Result Date: 11/26/2021 CLINICAL DATA:  Fall. EXAM: DG HIP (WITH OR WITHOUT PELVIS) 2-3V RIGHT COMPARISON:  03/09/2020 FINDINGS: There is diffuse decreased bone mineralization. Examination demonstrates a displaced subcapital fracture of the right femoral neck with superolateral displacement of the distal fragment. Left hip arthroplasty intact and unchanged. Fusion hardware over the lumbar spine. Degenerative changes of the spine. IMPRESSION: Displaced subcapital fracture of the right femoral neck. Electronically Signed   By: Marin Olp M.D.   On: 11/26/2021 08:32   CT HEAD WO CONTRAST  Result Date: 11/26/2021 CLINICAL DATA:  Moderate to severe head and facial trauma EXAM: CT HEAD WITHOUT CONTRAST CT MAXILLOFACIAL WITHOUT CONTRAST TECHNIQUE: Multidetector CT imaging of the head and maxillofacial structures were performed using the standard protocol without intravenous contrast. Multiplanar CT image reconstructions of the maxillofacial structures were also generated. RADIATION DOSE REDUCTION: This exam was performed according to the departmental dose-optimization program which includes automated  exposure control, adjustment of the mA and/or kV according to patient size and/or use of iterative reconstruction technique. COMPARISON:  Prior CT scan of the head 01/26/2021 FINDINGS: CT HEAD FINDINGS Brain: No evidence of acute infarction, hemorrhage, hydrocephalus, extra-axial collection or mass lesion/mass  effect. Mild cerebral cortical volume loss. Tiny remote lacunar infarct in the right cerebellum again noted. Rounded calcification affiliated with the posterior falx measures 1.1 x 1.2 cm. This is essentially unchanged compared to prior. Vascular: No hyperdense vessel or unexpected calcification. Skull: Normal. Negative for fracture or focal lesion. Other: None. CT MAXILLOFACIAL FINDINGS Osseous: No fracture or mandibular dislocation. No destructive process. Orbits: Preseptal periorbital soft tissue edema on the right consistent with contusion. No evidence of injury to the globe or extension retro-orbital. mild exophthalmos bilaterally. Sinuses: Clear. Soft tissues: Right preseptal periorbital soft tissue contusion. IMPRESSION: CT HEAD 1. No acute intracranial abnormality. 2. Stable cortical atrophy and small remote right cerebellar lacunar infarct. 3. Stable rounded calcification along the left posterior falx likely representing a benign meningioma. CT CSPINE 1. Right preseptal periorbital soft tissue contusion without evidence of globe injury, retro bulb are contusion were facial fracture. 2. Mild exophthalmos bilaterally. Electronically Signed   By: Jacqulynn Cadet M.D.   On: 11/26/2021 08:19   CT MAXILLOFACIAL WO CONTRAST  Result Date: 11/26/2021 CLINICAL DATA:  Moderate to severe head and facial trauma EXAM: CT HEAD WITHOUT CONTRAST CT MAXILLOFACIAL WITHOUT CONTRAST TECHNIQUE: Multidetector CT imaging of the head and maxillofacial structures were performed using the standard protocol without intravenous contrast. Multiplanar CT image reconstructions of the maxillofacial structures were also generated. RADIATION DOSE REDUCTION: This exam was performed according to the departmental dose-optimization program which includes automated exposure control, adjustment of the mA and/or kV according to patient size and/or use of iterative reconstruction technique. COMPARISON:  Prior CT scan of the head 01/26/2021  FINDINGS: CT HEAD FINDINGS Brain: No evidence of acute infarction, hemorrhage, hydrocephalus, extra-axial collection or mass lesion/mass effect. Mild cerebral cortical volume loss. Tiny remote lacunar infarct in the right cerebellum again noted. Rounded calcification affiliated with the posterior falx measures 1.1 x 1.2 cm. This is essentially unchanged compared to prior. Vascular: No hyperdense vessel or unexpected calcification. Skull: Normal. Negative for fracture or focal lesion. Other: None. CT MAXILLOFACIAL FINDINGS Osseous: No fracture or mandibular dislocation. No destructive process. Orbits: Preseptal periorbital soft tissue edema on the right consistent with contusion. No evidence of injury to the globe or extension retro-orbital. mild exophthalmos bilaterally. Sinuses: Clear. Soft tissues: Right preseptal periorbital soft tissue contusion. IMPRESSION: CT HEAD 1. No acute intracranial abnormality. 2. Stable cortical atrophy and small remote right cerebellar lacunar infarct. 3. Stable rounded calcification along the left posterior falx likely representing a benign meningioma. CT CSPINE 1. Right preseptal periorbital soft tissue contusion without evidence of globe injury, retro bulb are contusion were facial fracture. 2. Mild exophthalmos bilaterally. Electronically Signed   By: Jacqulynn Cadet M.D.   On: 11/26/2021 08:19      Scheduled Meds:  acetaminophen  650 mg Oral Q6H   buPROPion  150 mg Oral BID   calcium carbonate  1 tablet Oral Q breakfast   celecoxib  200 mg Oral BID   chlorhexidine  60 mL Topical Once   cholecalciferol  1,000 Units Oral Daily   famotidine  20 mg Oral BID   ferrous sulfate  325 mg Oral Q breakfast   FLUoxetine  20 mg Oral BID   levothyroxine  125 mcg Oral Daily   multivitamin  with minerals  1 tablet Oral Daily   oxcarbazepine  600 mg Oral BID   pantoprazole  40 mg Oral Daily   QUEtiapine  200 mg Oral QPM   Continuous Infusions:  dextrose 5 % and 0.45 % NaCl  with KCl 20 mEq/L 50 mL/hr at 11/27/21 0032   dextrose 5 % and 0.9% NaCl Stopped (11/27/21 0031)     LOS: 1 day     Dessa Phi, DO [facility] 11/27/2021, 12:30 PM   Available via Epic secure chat 7am-7pm After these hours, please refer to coverage provider listed on [email or web address]

## 2021-11-27 NOTE — Plan of Care (Signed)
  Problem: Pain Managment: Goal: General experience of comfort will improve Outcome: Progressing   Problem: Safety: Goal: Ability to remain free from injury will improve Outcome: Progressing   

## 2021-11-27 NOTE — Anesthesia Procedure Notes (Signed)
Procedure Name: MAC Date/Time: 11/27/2021 4:55 PM  Performed by: Niel Hummer, CRNAPre-anesthesia Checklist: Patient identified, Emergency Drugs available, Suction available and Patient being monitored Oxygen Delivery Method: Simple face mask

## 2021-11-27 NOTE — Progress Notes (Signed)
Patient has right hip pain after a fall 11/25/21. She came to the ED where x-rays showed a right femoral neck fracture. Orthopedics was consulted and she was seen by Dr. Doran Durand. Dr. Lyla Glassing consulted for surgical fixation. Of note she had a left THA from left femoral neck fracture with Dr. Lyla Glassing in 2021.   Examination of the right hip reveals no skin wounds, lesions or erythema. She has shortening and external rotation of her right leg. Pain over trochanteric region and lateral hip. Pain with hip motion. Sensory and motor function intact in LE bilaterally. Distal pedal pulses 2+ bilaterally. No significant pedal edema. Calves soft and non-tender.   She has an unstable right femoral neck fracture that will require surgical treatment for pain control and allow immediate immobilization out of bed. Patient is under care of the medical team for perioperative medical optimization. Plan for right total hip arthroplasty today. Discussed R/B/A. See risk statement. Hold chemical DVT ppx. Patient to remain NPO. All questions solicited and answered.   The risks, benefits, and alternatives were discussed with the patient. There are risks associated with the surgery including, but not limited to, problems with anesthesia (death), infection, differences in leg length/angulation/rotation, fracture of bones, loosening or failure of implants, malunion, nonunion, hematoma (blood accumulation) which may require surgical drainage, blood clots, pulmonary embolism, nerve injury (foot drop), and blood vessel injury. The patient understands these risks and elects to proceed.   Larene Pickett, PA-C Orthopedic Surgery [facility]

## 2021-11-27 NOTE — Progress Notes (Signed)
Pt admitted Pacu very agitated kept took off mask off, O2sat was 78~88 RM  Applied soft restraint to bilateral hand to protect safety. Pt answered  then back to sleep, ans awake 1 min. Later, moving a lot. Then back to sleep denies pain. Ox1 to self only.

## 2021-11-27 NOTE — Interval H&P Note (Signed)
History and Physical Interval Note:  11/27/2021 4:41 PM  Victoria Levine  has presented today for surgery, with the diagnosis of right femoral neck fracture.  The various methods of treatment have been discussed with the patient and family. After consideration of risks, benefits and other options for treatment, the patient has consented to  Procedure(s): TOTAL HIP ARTHROPLASTY ANTERIOR APPROACH (Right) as a surgical intervention.  The patient's history has been reviewed, patient examined, no change in status, stable for surgery.  I have reviewed the patient's chart and labs.  Questions were answered to the patient's satisfaction.    The risks, benefits, and alternatives were discussed with the patient. There are risks associated with the surgery including, but not limited to, problems with anesthesia (death), infection, instability (giving out of the joint), dislocation, differences in leg length/angulation/rotation, fracture of bones, loosening or failure of implants, hematoma (blood accumulation) which may require surgical drainage, blood clots, pulmonary embolism, nerve injury (foot drop and lateral thigh numbness), and blood vessel injury. The patient understands these risks and elects to proceed.    Hilton Cork Treyshon [place]

## 2021-11-27 NOTE — Anesthesia Preprocedure Evaluation (Signed)
Anesthesia Evaluation  Patient identified by MRN, date of birth, ID band Patient awake    Reviewed: Allergy & Precautions, NPO status , Patient's Chart, lab work & pertinent test results, reviewed documented beta blocker date and time   History of Anesthesia Complications (+) PONV and history of anesthetic complications  Airway Mallampati: III  TM Distance: >3 FB Neck ROM: Full    Dental   Pulmonary asthma , COPD,    Pulmonary exam normal        Cardiovascular hypertension, Pt. on medications and Pt. on home beta blockers Normal cardiovascular exam     Neuro/Psych Anxiety Depression Bipolar Disorder negative neurological ROS     GI/Hepatic Neg liver ROS, hiatal hernia, GERD  ,  Endo/Other  Hypothyroidism   Renal/GU negative Renal ROS  negative genitourinary   Musculoskeletal  (+) narcotic dependent  Abdominal   Peds  Hematology  (+) Blood dyscrasia, anemia ,   Anesthesia Other Findings Right femoral neck fx  Reproductive/Obstetrics                           Anesthesia Physical Anesthesia Plan  ASA: 3  Anesthesia Plan: Spinal   Post-op Pain Management: Tylenol PO (pre-op)* and Ketamine IV*   Induction:   PONV Risk Score and Plan: 3 and Propofol infusion, Treatment may vary due to age or medical condition, Ondansetron and TIVA  Airway Management Planned: Nasal Cannula and Simple Face Mask  Additional Equipment: None  Intra-op Plan:   Post-operative Plan:   Informed Consent: I have reviewed the patients History and Physical, chart, labs and discussed the procedure including the risks, benefits and alternatives for the proposed anesthesia with the patient or authorized representative who has indicated his/her understanding and acceptance.       Plan Discussed with:   Anesthesia Plan Comments:       Anesthesia Quick Evaluation

## 2021-11-28 ENCOUNTER — Encounter (HOSPITAL_COMMUNITY): Payer: Self-pay | Admitting: Orthopedic Surgery

## 2021-11-28 DIAGNOSIS — S72001A Fracture of unspecified part of neck of right femur, initial encounter for closed fracture: Secondary | ICD-10-CM | POA: Diagnosis not present

## 2021-11-28 LAB — CBC
HCT: 28.4 % — ABNORMAL LOW (ref 36.0–46.0)
Hemoglobin: 8.8 g/dL — ABNORMAL LOW (ref 12.0–15.0)
MCH: 27.3 pg (ref 26.0–34.0)
MCHC: 31 g/dL (ref 30.0–36.0)
MCV: 88.2 fL (ref 80.0–100.0)
Platelets: 219 10*3/uL (ref 150–400)
RBC: 3.22 MIL/uL — ABNORMAL LOW (ref 3.87–5.11)
RDW: 18.6 % — ABNORMAL HIGH (ref 11.5–15.5)
WBC: 9.2 10*3/uL (ref 4.0–10.5)
nRBC: 0 % (ref 0.0–0.2)

## 2021-11-28 MED ORDER — DIPHENHYDRAMINE HCL 12.5 MG/5ML PO ELIX
12.5000 mg | ORAL_SOLUTION | ORAL | Status: DC | PRN
  Administered 2021-11-30: 25 mg via ORAL
  Filled 2021-11-28: qty 10

## 2021-11-28 MED ORDER — ONDANSETRON HCL 4 MG/2ML IJ SOLN: 4.0000 mg | Freq: Four times a day (QID) | INTRAMUSCULAR | Status: DC | PRN

## 2021-11-28 MED ORDER — DOCUSATE SODIUM 100 MG PO CAPS
100.0000 mg | ORAL_CAPSULE | Freq: Two times a day (BID) | ORAL | Status: DC
  Administered 2021-11-28 – 2021-12-01 (×7): 100 mg via ORAL
  Filled 2021-11-28 (×7): qty 1

## 2021-11-28 MED ORDER — ALUM & MAG HYDROXIDE-SIMETH 200-200-20 MG/5ML PO SUSP: 30.0000 mL | ORAL | Status: DC | PRN

## 2021-11-28 MED ORDER — METOCLOPRAMIDE HCL 5 MG/ML IJ SOLN: 5.0000 mg | Freq: Three times a day (TID) | INTRAMUSCULAR | Status: DC | PRN

## 2021-11-28 MED ORDER — MENTHOL 3 MG MT LOZG: 1.0000 | LOZENGE | OROMUCOSAL | Status: DC | PRN

## 2021-11-28 MED ORDER — METOCLOPRAMIDE HCL 5 MG PO TABS: 5.0000 mg | ORAL_TABLET | Freq: Three times a day (TID) | ORAL | Status: DC | PRN

## 2021-11-28 MED ORDER — PHENOL 1.4 % MT LIQD: 1.0000 | OROMUCOSAL | Status: DC | PRN

## 2021-11-28 MED ORDER — ASPIRIN 81 MG PO CHEW
81.0000 mg | CHEWABLE_TABLET | Freq: Two times a day (BID) | ORAL | Status: DC
  Administered 2021-11-28 – 2021-12-01 (×7): 81 mg via ORAL
  Filled 2021-11-28 (×7): qty 1

## 2021-11-28 MED ORDER — ONDANSETRON HCL 4 MG PO TABS: 4.0000 mg | ORAL_TABLET | Freq: Four times a day (QID) | ORAL | Status: DC | PRN

## 2021-11-28 MED ORDER — SENNA 8.6 MG PO TABS
1.0000 | ORAL_TABLET | Freq: Two times a day (BID) | ORAL | Status: DC
  Administered 2021-11-28 – 2021-12-01 (×7): 8.6 mg via ORAL
  Filled 2021-11-28 (×7): qty 1

## 2021-11-28 MED ORDER — ASPIRIN 81 MG PO CHEW: 81.0000 mg | CHEWABLE_TABLET | Freq: Two times a day (BID) | ORAL | 0 refills | Status: AC

## 2021-11-28 MED ORDER — DOCUSATE SODIUM 100 MG PO CAPS: 100.0000 mg | ORAL_CAPSULE | Freq: Two times a day (BID) | ORAL | Status: DC

## 2021-11-28 MED ORDER — POLYETHYLENE GLYCOL 3350 17 G PO PACK: 17.0000 g | PACK | Freq: Every day | ORAL | Status: DC | PRN

## 2021-11-28 MED ORDER — OXYCODONE HCL 5 MG PO TABS: 5.0000 mg | ORAL_TABLET | ORAL | 0 refills | Status: DC | PRN

## 2021-11-28 NOTE — NC FL2 (Signed)
[place] LEVEL OF CARE SCREENING TOOL     IDENTIFICATION  Patient Name: Victoria Levine Birthdate: May 07, 1950 Sex: female Admission Date (Current Location): 11/26/2021  [facility] and [place] Number:  Herbalist and Address:  [facility],  [place] [place], [place]      Provider Number: [id number]  Attending Physician Name and Address:  Dessa Phi, DO  Relative Name and Phone Number:  [name], [name]   [phone]    Current Level of Care: Hospital Recommended Level of Care: [place] Prior Approval Number:    Date Approved/Denied:   PASRR Number: Pending  Discharge Plan: SNF    Current Diagnoses: Patient Active Problem List   Diagnosis Date Noted   Hip fracture ([place]) 11/26/2021   Acute hypoxemic respiratory failure ([place]) 11/26/2021   Depression 11/26/2021   Chronic anemia 11/26/2021   Hyponatremia 11/26/2021   Diarrhea 01/18/2021   Generalized weakness 01/18/2021   Lactic acidosis 01/14/2021   Sepsis ([place]) 01/14/2021   Hypercalcemia 01/14/2021   Leukocytosis 01/14/2021   Thrombocytosis 01/14/2021   Dehydration 01/14/2021   Transaminitis 01/14/2021   Acute kidney injury ([place]) 01/13/2021   Encephalopathy 03/09/2020   Pressure injury of skin 38/12/1749   Acute metabolic encephalopathy 02/58/5277   Displaced intertrochanteric fracture of left femur, initial encounter for closed fracture ([place]) 02/23/2020   Hypokalemia 02/23/2020   Intractable nausea and vomiting 08/31/2019   Epigastric pain 08/31/2019   Alcohol abuse 08/31/2019   Microcytic anemia 08/31/2019   Closed compression fracture of L2 vertebra ([place]) 12/11/2017   Chronic pain 07/26/2015   Anxiety state 06/28/2015   Benzodiazepine dependence ([place]) 10/13/2014   Opioid abuse with opioid-induced mood disorder ([place]) 10/13/2014   Bipolar 1 disorder, depressed ([place]) 10/12/2014   Spondylolisthesis of lumbar region 07/28/2014    Osteoporosis 11/17/2013   Migraines    Herpes    Hypothyroidism 08/13/2008   HYPERCHOLESTEROLEMIA 08/13/2008   Bipolar disorder ([place]) 08/13/2008   Asthma-chronic obstructive pulmonary disease overlap syndrome ([place]) 08/13/2008   GERD (gastroesophageal reflux disease) 08/13/2008   HIATAL HERNIA 08/13/2008   ALLERGY 08/13/2008    Orientation RESPIRATION BLADDER Height & Weight     Self, Time, Situation, Place  Normal Continent Weight: 154 lb (69.9 kg) Height:  '5\' 6"'$  (167.6 cm)  BEHAVIORAL SYMPTOMS/MOOD NEUROLOGICAL BOWEL NUTRITION STATUS      Continent Diet (Regular)  AMBULATORY STATUS COMMUNICATION OF NEEDS Skin   Limited Assist Verbally Surgical wounds                       Personal Care Assistance Level of Assistance  Bathing, Feeding, Dressing Bathing Assistance: Limited assistance Feeding assistance: Independent Dressing Assistance: Limited assistance     Functional Limitations Info  Sight, Hearing, Speech Sight Info: Adequate Hearing Info: Adequate Speech Info: Adequate    SPECIAL CARE FACTORS FREQUENCY  PT (By licensed PT), OT (By licensed OT)     PT Frequency: Minimum 5x a week OT Frequency: Minimum 5x a week            Contractures Contractures Info: Not present    Additional Factors Info  Code Status, Allergies, Psychotropic Code Status Info: Full Code Allergies Info: Prednisone   Lithium Carbonate   Other   Sulfa Antibiotics Psychotropic Info: buPROPion (WELLBUTRIN XL) 24 hr tablet 150 mg, FLUoxetine (PROZAC) capsule 20 mg, QUEtiapine (SEROQUEL) tablet 200 mg         Current Medications (11/28/2021):  This is the current  hospital active medication list Current Facility-Administered Medications  Medication Dose Route Frequency Provider Last Rate Last Admin   albuterol (PROVENTIL) (2.5 MG/3ML) 0.083% nebulizer solution 2.5 mg  2.5 mg Nebulization Q6H PRN Swinteck, Aaron Edelman, MD   2.5 mg at 11/28/21 1518   alum & mag hydroxide-simeth (MAALOX/MYLANTA)  200-200-20 MG/5ML suspension 30 mL  30 mL Oral Q4H PRN Swinteck, Aaron Edelman, MD       aspirin chewable tablet 81 mg  81 mg Oral BID Rod Can, MD   81 mg at 11/28/21 0910   buPROPion (WELLBUTRIN XL) 24 hr tablet 150 mg  150 mg Oral BID Rod Can, MD   150 mg at 11/28/21 0910   calcium carbonate (OS-CAL - dosed in mg of elemental calcium) tablet 1,250 mg  1 tablet Oral Q breakfast Rod Can, MD   1,250 mg at 11/28/21 0947   cholecalciferol (VITAMIN D3) 25 MCG (1000 UNIT) tablet 1,000 Units  1,000 Units Oral Daily Rod Can, MD   1,000 Units at 11/28/21 0911   diphenhydrAMINE (BENADRYL) 12.5 MG/5ML elixir 12.5-25 mg  12.5-25 mg Oral Q4H PRN Swinteck, Aaron Edelman, MD       docusate sodium (COLACE) capsule 100 mg  100 mg Oral BID Dessa Phi, DO   100 mg at 11/28/21 0911   famotidine (PEPCID) tablet 20 mg  20 mg Oral BID Rod Can, MD   20 mg at 11/28/21 0910   ferrous sulfate tablet 325 mg  325 mg Oral Q breakfast Rod Can, MD   325 mg at 11/28/21 0910   FLUoxetine (PROZAC) capsule 20 mg  20 mg Oral BID Rod Can, MD   20 mg at 11/28/21 0911   gabapentin (NEURONTIN) capsule 600 mg  600 mg Oral TID PRN Rod Can, MD   600 mg at 11/28/21 1641   levothyroxine (SYNTHROID) tablet 125 mcg  125 mcg Oral Daily Rod Can, MD   125 mcg at 11/28/21 0646   LORazepam (ATIVAN) tablet 0.5 mg  0.5 mg Oral Q6H PRN Rod Can, MD   0.5 mg at 11/28/21 0910   menthol-cetylpyridinium (CEPACOL) lozenge 3 mg  1 lozenge Oral PRN Swinteck, Aaron Edelman, MD       Or   phenol (CHLORASEPTIC) mouth spray 1 spray  1 spray Mouth/Throat PRN Swinteck, Aaron Edelman, MD       methocarbamol (ROBAXIN) tablet 500 mg  500 mg Oral Q6H PRN Rod Can, MD   500 mg at 11/28/21 1644   Or   methocarbamol (ROBAXIN) 500 mg in dextrose 5 % 50 mL IVPB  500 mg Intravenous Q6H PRN Swinteck, Aaron Edelman, MD       metoCLOPramide (REGLAN) tablet 5-10 mg  5-10 mg Oral Q8H PRN Swinteck, Aaron Edelman, MD       Or    metoCLOPramide (REGLAN) injection 5-10 mg  5-10 mg Intravenous Q8H PRN Swinteck, Aaron Edelman, MD       morphine (PF) 2 MG/ML injection 1 mg  1 mg Intravenous Q2H PRN Rod Can, MD   1 mg at 11/27/21 1319   multivitamin with minerals tablet 1 tablet  1 tablet Oral Daily Swinteck, Aaron Edelman, MD   1 tablet at 11/28/21 0911   ondansetron (ZOFRAN) tablet 4 mg  4 mg Oral Q6H PRN Swinteck, Aaron Edelman, MD       Or   ondansetron (ZOFRAN) injection 4 mg  4 mg Intravenous Q6H PRN Swinteck, Aaron Edelman, MD       Oxcarbazepine (TRILEPTAL) tablet 600 mg  600 mg Oral BID Rod Can, MD  600 mg at 11/28/21 0910   oxyCODONE (Oxy IR/ROXICODONE) immediate release tablet 10 mg  10 mg Oral Q4H PRN Rod Can, MD   10 mg at 11/28/21 1501   pantoprazole (PROTONIX) EC tablet 40 mg  40 mg Oral Daily Swinteck, Aaron Edelman, MD   40 mg at 11/28/21 0911   polyethylene glycol (MIRALAX / GLYCOLAX) packet 17 g  17 g Oral Daily PRN Swinteck, Aaron Edelman, MD       QUEtiapine (SEROQUEL) tablet 200 mg  200 mg Oral QPM Swinteck, Aaron Edelman, MD   200 mg at 11/28/21 1641   senna (SENOKOT) tablet 8.6 mg  1 tablet Oral BID Rod Can, MD   8.6 mg at 11/28/21 0911     Discharge Medications: Please see discharge summary for a list of discharge medications.  Relevant Imaging Results:  Relevant Lab Results:   Additional Information SSN [id number]  Ross Ludwig, LCSW

## 2021-11-28 NOTE — Evaluation (Signed)
Occupational Therapy Evaluation Patient Details Name: Victoria Levine MRN: [id number] DOB: 09/07/1950 Today's Date: 11/28/2021   History of Present Illness Victoria Levine is a 71 yr old female admitted to the hospital after a mechanical fall at home. She was found to have a displaced R femoral neck fracture & is s/p a R THA (anterior approach) on 11-27-2021. PMH: hypothyroidism, HTN, depression, bipolar disorder, OP, DDD, lumbar compression fracture   Clinical Impression      Victoria Levine was found in the semi-fowler's position in bed & she was oriented x4. She reported having 7/10 R hip pain at rest (her nurse was informed and pain medication was given). She deferred attempts at out of bed activity today, due to reports of pain. She required mod assist for rolling & scooting to the head of the bed, SBA for simulated upper body dressing, and max assist for simulated lower body dressing. She was educated on the importance of maintaining regular functional activity to decrease the risk for further post-surgical deconditioning, benefits of implementing regular deep breathing exercises using the incentive spirometer to decrease the risk for potential PNA, and the importance of implementing regular repositioning in bed to maintain skin integrity. She will benefit from further OT services to facilitate progressive ADL performance.    Recommendations for follow up therapy are one component of a multi-disciplinary discharge planning process, led by the attending physician.  Recommendations may be updated based on patient status, additional functional criteria and insurance authorization.   Follow Up Recommendations  Skilled nursing-short term rehab (<3 hours/day)    Assistance Recommended at Discharge Intermittent Supervision/Assistance  Patient can return home with the following Assist for transportation;Assistance with cooking/housework;A lot of help with walking and/or transfers;A lot of help with  bathing/dressing/bathroom    Functional Status Assessment  Patient has had a recent decline in their functional status and demonstrates the ability to make significant improvements in function in a reasonable and predictable amount of time.  Equipment Recommendations          Precautions / Restrictions Precautions Precautions: Fall Restrictions Weight Bearing Restrictions: No RLE Weight Bearing: Weight bearing as tolerated      Mobility Bed Mobility Overal bed mobility: Needs Assistance        General bed mobility comments: She required mod assist for scooting to the [facility], with instruction provided on pulling on bed rail and pushing with B LE    Transfers         General transfer comment: Unable to assess transfers, as the pt gently deferred, given reports of increased R hip pain      Balance       Sitting balance - Comments: unable to assess       Standing balance comment: unable to assess         ADL either performed or assessed with clinical judgement   ADL   Eating/Feeding: Independent Eating/Feeding Details (indicate cue type and reason): based on clincal judgement Grooming: Set up Grooming Details (indicate cue type and reason): simulated at bed level             Lower Body Dressing: Maximal assistance Lower Body Dressing Details (indicate cue type and reason): based on clinical judgement           Vision Patient Visual Report: No change from baseline Additional Comments: She reported use of reading glasses. She correctly read the time depicted on the wall clock.  Pertinent Vitals/Pain Pain Assessment Pain Assessment: 0-10 Pain Score: 7  Pain Location: R hip Pain Intervention(s): Repositioned, RN gave pain meds during session     Hand Dominance Right   Extremity/Trunk Assessment Upper Extremity Assessment Upper Extremity Assessment: Overall WFL for tasks assessed   Lower Extremity Assessment Lower Extremity  Assessment:  (L LE AROM WFL) RLE Deficits / Details: Required intermittent active assist for R LE, given post-surgical stiffness and pain       Communication Communication Communication: No difficulties   Cognition Arousal/Alertness: Awake/alert Behavior During Therapy: WFL for tasks assessed/performed Overall Cognitive Status: Within Functional Limits for tasks assessed                       Home Living   Living Arrangements: Alone   Type of Home: HouseShe lives in a 1 level home, with a level entry and walk-shower. Equipment owned: cane, rollator, rolling walker, bedside commode, shower chair Home Access: Level entry        Prior Functioning/Environment Prior Level of Function : Independent with ADLs              Mobility Comments: used a rollator inside the home ADLs Comments: Independent with ADLs & driving, she had hired assist 1x per month for cleaning, and she mostly prepared microwaveable meals.        OT Problem List: Decreased strength;Decreased range of motion;Decreased activity tolerance;Impaired balance (sitting and/or standing);Pain      OT Treatment/Interventions: Self-care/ADL training;Therapeutic exercise;DME and/or AE instruction;Energy conservation;Therapeutic activities;Patient/family education;Balance training    OT Goals(Current goals can be found in the care plan section) Acute Rehab OT Goals Patient Stated Goal: To get better and return home OT Goal Formulation: With patient Time For Goal Achievement: 12/12/21 Potential to Achieve Goals: Good ADL Goals Pt Will Perform Grooming: with supervision Pt Will Perform Lower Body Dressing: with supervision;with adaptive equipment;sit to/from stand Pt Will Transfer to Toilet: with supervision;ambulating Pt Will Perform Toileting - Clothing Manipulation and hygiene: with supervision;sit to/from stand  OT Frequency: Min 2X/week    Co-evaluation              AM-PAC OT "6 Clicks" Daily  Activity     Outcome Measure Help from another person eating meals?: None Help from another person taking care of personal grooming?: A Little Help from another person toileting, which includes using toliet, bedpan, or urinal?: A Lot Help from another person bathing (including washing, rinsing, drying)?: A Lot Help from another person to put on and taking off regular upper body clothing?: A Little Help from another person to put on and taking off regular lower body clothing?: A Lot 6 Click Score: 16   End of Session Nurse Communication: Patient requests pain meds (and Patient requested Albuterol for management of chronic asthma)  Activity Tolerance: Patient limited by pain Patient left: in bed;with call bell/phone within reach;with bed alarm set  OT Visit Diagnosis: Muscle weakness (generalized) (M62.81);Pain                Time: 5170-0174 OT Time Calculation (min): 15 min Charges:  OT General Charges $OT Visit: 1 Visit OT Evaluation $OT Eval Moderate Complexity: 1 Mod   Giuliano Preece L Alfonse Garringer 11/28/2021, 4:01 PM

## 2021-11-28 NOTE — Plan of Care (Signed)
  Problem: Activity: Goal: Risk for activity intolerance will decrease Outcome: Progressing   Problem: Pain Managment: Goal: General experience of comfort will improve Outcome: Progressing   Problem: Safety: Goal: Ability to remain free from injury will improve Outcome: Progressing   

## 2021-11-28 NOTE — Progress Notes (Signed)
PROGRESS NOTE    Victoria Levine  WUX:[id number] DOB: [birth year] DOA: 11/26/2021 PCP: [name], MD     Brief Narrative:  Victoria Levine is a 71 y.o. female with medical history significant of hypothyroidism, hypertension and depression who presented after suffering of a mechanical fall. Yesterday (11/25/21) around 10 am she was walking from her restroom to her bedroom when she slipped and fell, landing on her right hip. No significant head trauma and no loss of consciousness. Post fall patient had severe pain at her right hip, 10/10, sharp in nature, worse with movement and with no radiation. She was not able to stand up and remained on the floor until the following day at 6:30 am when she finally was able to reach her phone and called EMS. She slowly crawled into her bedroom to get to her phone.  She was found to have right hip fracture. She underwent right total hip arthroplasty by Dr. Lyla Glassing 9/11.  New events last 24 hours / Subjective: Pain better today, rates it 5/10. Has not worked with PT but states that she does not want to go to rehab, wants to go home. However, she lives at home alone.   Assessment & Plan:   Principal Problem:   Hip fracture ([place]) Active Problems:   Acute hypoxemic respiratory failure (H[facility])   Hyponatremia   Chronic anemia   Depression   GERD (gastroesophageal reflux disease)   Hypothyroidism   Right hip fracture -Orthopedic surgery consulted, s/p right total hip arthroplasty 9/11  -Pain control -PT recommending SNF   Acute hypoxic respiratory failure -CTA chest negative for PE  Mood disorder -Oxcarbazepine, bupropion, fluoxetine, quetiapine -Ativan as needed for anxiety  Hypothyroidism -Synthroid  DVT prophylaxis:  SCDs Start: 11/27/21 2003 SCDs Start: 11/26/21 1012  Code Status: Full code Family Communication: No family at bedside Disposition Plan:  Status is: Inpatient Remains inpatient appropriate because: Would recommend  SNF placement if she is agreeable   Antimicrobials:  Anti-infectives (From admission, onward)    Start     Dose/Rate Route Frequency Ordered Stop   11/28/21 0600  ceFAZolin (ANCEF) IVPB 2g/100 mL premix  Status:  Discontinued        2 g 200 mL/hr over 30 Minutes Intravenous On call to O.R. 11/27/21 2002 11/27/21 2005   11/27/21 2300  ceFAZolin (ANCEF) IVPB 2g/100 mL premix        2 g 200 mL/hr over 30 Minutes Intravenous Every 6 hours 11/27/21 2002 11/28/21 0545   11/27/21 1645  ceFAZolin (ANCEF) IVPB 2g/100 mL premix        2 g 200 mL/hr over 30 Minutes Intravenous  Once 11/27/21 1642 11/27/21 1739   11/27/21 1640  ceFAZolin (ANCEF) 2-4 GM/100ML-% IVPB       Note to Pharmacy: Nash Dimmer W: cabinet override      11/27/21 1640 11/27/21 1727        Objective: Vitals:   11/27/21 2316 11/28/21 0412 11/28/21 0420 11/28/21 0906  BP: (!) 148/89 129/85  (!) 146/78  Pulse: 91 94  97  Resp: '18 18  18  '$ Temp: 99.1 F (37.3 C) (!) 97.5 F (36.4 C)  99.1 F (37.3 C)  TempSrc: Oral Oral    SpO2: 90% 94% 100% (!) 86%  Weight:      Height:        Intake/Output Summary (Last 24 hours) at 11/28/2021 1116 Last data filed at 11/28/2021 0907 Gross per 24 hour  Intake 3387.98 ml  Output 1950  ml  Net 1437.98 ml    Filed Weights   11/26/21 0839  Weight: 69.9 kg    Examination:  General exam: Appears calm and comfortable  Respiratory system: Clear to auscultation. Respiratory effort normal. No respiratory distress. No conversational dyspnea.   Cardiovascular system: S1 & S2 heard, RRR. No murmurs. No pedal edema. Gastrointestinal system: Abdomen is nondistended, soft and nontender. Normal bowel sounds heard. Central nervous system: Alert and oriented. Extremities: Symmetric in appearance  Skin: No rashes, lesions or ulcers on exposed skin   Data Reviewed: I have personally reviewed following labs and imaging studies  CBC: Recent Labs  Lab 11/26/21 0755 11/27/21 0325  11/28/21 0335  WBC 13.9* 11.2* 9.2  HGB 11.5* 10.7* 8.8*  HCT 37.4 35.4* 28.4*  MCV 86.6 88.7 88.2  PLT 240 281 811    Basic Metabolic Panel: Recent Labs  Lab 11/26/21 0755 11/27/21 0325  NA 133* 135  K 4.2 3.9  CL 100 102  CO2 22 27  GLUCOSE 114* 135*  BUN 18 20  CREATININE 0.81 0.68  CALCIUM 9.8 9.4    GFR: Estimated Creatinine Clearance: 59.5 mL/min (by C-G formula based on SCr of 0.68 mg/dL). Liver Function Tests: Recent Labs  Lab 11/26/21 0755  AST 21  ALT 18  ALKPHOS 61  BILITOT 0.4  PROT 6.2*  ALBUMIN 3.8    No results for input(s): "LIPASE", "AMYLASE" in the last 168 hours. No results for input(s): "AMMONIA" in the last 168 hours. Coagulation Profile: No results for input(s): "INR", "PROTIME" in the last 168 hours. Cardiac Enzymes: Recent Labs  Lab 11/26/21 0755  CKTOTAL 73  CKMB 3.1    BNP (last 3 results) No results for input(s): "PROBNP" in the last 8760 hours. HbA1C: No results for input(s): "HGBA1C" in the last 72 hours. CBG: No results for input(s): "GLUCAP" in the last 168 hours. Lipid Profile: No results for input(s): "CHOL", "HDL", "LDLCALC", "TRIG", "CHOLHDL", "LDLDIRECT" in the last 72 hours. Thyroid Function Tests: No results for input(s): "TSH", "T4TOTAL", "FREET4", "T3FREE", "THYROIDAB" in the last 72 hours. Anemia Panel: No results for input(s): "VITAMINB12", "FOLATE", "FERRITIN", "TIBC", "IRON", "RETICCTPCT" in the last 72 hours. Sepsis Labs: No results for input(s): "PROCALCITON", "LATICACIDVEN" in the last 168 hours.  Recent Results (from the past 240 hour(s))  Surgical pcr screen     Status: None   Collection Time: 11/26/21 11:23 PM   Specimen: Nasal Mucosa; Nasal Swab  Result Value Ref Range Status   MRSA, PCR NEGATIVE NEGATIVE Final   Staphylococcus aureus NEGATIVE NEGATIVE Final    Comment: (NOTE) The Xpert SA Assay (FDA approved for NASAL specimens in patients 56 years of age and older), is one component of a  comprehensive surveillance program. It is not intended to diagnose infection nor to guide or monitor treatment. Performed at [facility], [place] [address], [place], [place] [address]       Radiology Studies: DG HIP UNILAT WITH PELVIS 1V RIGHT  Result Date: 11/27/2021 CLINICAL DATA:  Right hip replacement EXAM: DG HIP (WITH OR WITHOUT PELVIS) 1V RIGHT COMPARISON:  11/26/2021 FINDINGS: Intraoperative spot images demonstrate changes of right hip replacement. Normal AP alignment. No visible hardware complicating feature. IMPRESSION: Right hip replacement.  No visible complicating feature. Electronically Signed   By: Rolm Baptise M.D.   On: 11/27/2021 19:17   DG Pelvis Portable  Result Date: 11/27/2021 CLINICAL DATA:  Total right hip arthroplasty EXAM: PORTABLE PELVIS 1-2 VIEWS COMPARISON:  02/24/2020 FINDINGS: Remote changes  of left hip replacement. New right hip replacement. Normal AP alignment. No hardware bony complicating feature. IMPRESSION: Right hip replacement.  No visible complicating feature. Electronically Signed   By: Rolm Baptise M.D.   On: 11/27/2021 19:16   DG C-Arm 1-60 Min-No Report  Result Date: 11/27/2021 Fluoroscopy was utilized by the requesting physician.  No radiographic interpretation.   DG C-Arm 1-60 Min-No Report  Result Date: 11/27/2021 Fluoroscopy was utilized by the requesting physician.  No radiographic interpretation.   DG Knee Right Port  Result Date: 11/27/2021 CLINICAL DATA:  Closed displaced fracture right femoral neck. EXAM: PORTABLE RIGHT KNEE - 1-2 VIEW COMPARISON:  Correlation with pelvis and right hip radiographs 11/26/2021 FINDINGS: Mild medial joint space narrowing and peripheral osteophytosis. Mild patella alta. Minimal superior and inferior patellar degenerative osteophytes. No joint effusion. No acute fracture or dislocation. IMPRESSION: Mild patella alta. Mild medial compartment and minimal patellofemoral compartment  osteoarthritis. Electronically Signed   By: Yvonne Kendall M.D.   On: 11/27/2021 08:41      Scheduled Meds:  aspirin  81 mg Oral BID   buPROPion  150 mg Oral BID   calcium carbonate  1 tablet Oral Q breakfast   cholecalciferol  1,000 Units Oral Daily   docusate sodium  100 mg Oral BID   famotidine  20 mg Oral BID   ferrous sulfate  325 mg Oral Q breakfast   FLUoxetine  20 mg Oral BID   levothyroxine  125 mcg Oral Daily   multivitamin with minerals  1 tablet Oral Daily   oxcarbazepine  600 mg Oral BID   pantoprazole  40 mg Oral Daily   QUEtiapine  200 mg Oral QPM   senna  1 tablet Oral BID   Continuous Infusions:  sodium chloride 150 mL/hr at 11/27/21 2238   methocarbamol (ROBAXIN) IV       LOS: 2 days     Dessa Phi, DO [facility] 11/28/2021, 11:16 AM   Available via Epic secure chat 7am-7pm After these hours, please refer to coverage provider listed on [email or web address]

## 2021-11-28 NOTE — Progress Notes (Signed)
    Subjective:  Patient reports pain as mild.  Denies N/V/CP/SOB/Abd pain. She states he hip is feeling much better today since having the surgery. She denies tinging and numbness in LE bilaterally.   Objective:   VITALS:   Vitals:   11/27/21 2101 11/27/21 2316 11/28/21 0412 11/28/21 0420  BP: 122/75 (!) 148/89 129/85   Pulse: 81 91 94   Resp: '18 18 18   '$ Temp: 98.3 F (36.8 C) 99.1 F (37.3 C) (!) 97.5 F (36.4 C)   TempSrc: Oral Oral Oral   SpO2: 94% 90% 94% 100%  Weight:      Height:        Patient is lying comfortably in bed. NAD. Neurologically intact ABD soft Neurovascular intact Sensation intact distally Intact pulses distally Dorsiflexion/Plantar flexion intact Incision: dressing C/D/I No cellulitis present Compartment soft Slight bruising just distal to aquacel dressing.  Painless log rolling of her hip.   Lab Results  Component Value Date   WBC 9.2 11/28/2021   HGB 8.8 (L) 11/28/2021   HCT 28.4 (L) 11/28/2021   MCV 88.2 11/28/2021   PLT 219 11/28/2021   BMET    Component Value Date/Time   NA 135 11/27/2021 0325   K 3.9 11/27/2021 0325   CL 102 11/27/2021 0325   CO2 27 11/27/2021 0325   GLUCOSE 135 (H) 11/27/2021 0325   BUN 20 11/27/2021 0325   CREATININE 0.68 11/27/2021 0325   CREATININE 0.67 12/05/2012 1401   CALCIUM 9.4 11/27/2021 0325   CALCIUM 9.4 10/31/2011 1152   GFRNONAA >60 11/27/2021 0325     Assessment/Plan: 1 Day Post-Op   Principal Problem:   Hip fracture (HCC) Active Problems:   Hypothyroidism   GERD (gastroesophageal reflux disease)   Acute hypoxemic respiratory failure (HCC)   Depression   Chronic anemia   Hyponatremia   WBAT with walker DVT ppx: Aspirin, SCDs, TEDS PO pain control PT/OT: PT to come by today.  Dispo: Patient is under care of the medical team. Disposition per their recommendation. Discussion about SNF, patient voiced wanting to go home. Pain medication and DVT ppx printed in chart.    Charlott Rakes, PA-C 11/28/2021, 7:50 AM   [facility]  [place] [address], [address], [place], [place] [address] Phone: [phone] [email or web address] Facebook  [employer]

## 2021-11-28 NOTE — Progress Notes (Signed)
Physical Therapy Treatment Patient Details Name: Victoria Levine MRN: [id number] DOB: 30-May-1950 Today's Date: 11/28/2021   History of Present Illness 71 y.o. female with a displaced Right femoral neck fracture, s/p R DA-THA 11/27/21. PMH: alcohol abuse, bipolar disorder, osteoporosis, anxiety, AKI.    PT Comments    Pt required mod assist for supine to sit, and min assist for sit to stand. In standing, pt stated she was unable to attempt taking any forward or sideways steps due to pain, so was assisted back to supine. She agreed to bed level exercises. ST-SNF remains appropriate DC venue.     Recommendations for follow up therapy are one component of a multi-disciplinary discharge planning process, led by the attending physician.  Recommendations may be updated based on patient status, additional functional criteria and insurance authorization.  Follow Up Recommendations  Skilled nursing-short term rehab (<3 hours/day) Can patient physically be transported by private vehicle: Yes   Assistance Recommended at Discharge Intermittent Supervision/Assistance  Patient can return home with the following A little help with walking and/or transfers;A little help with bathing/dressing/bathroom;Assistance with cooking/housework;Assist for transportation;Help with stairs or ramp for entrance   Equipment Recommendations  None recommended by PT    Recommendations for Other Services       Precautions / Restrictions Precautions Precautions: Fall Precaution Comments: 2 falls in past 6 months (including fall just PTA) Restrictions Weight Bearing Restrictions: No RLE Weight Bearing: Weight bearing as tolerated     Mobility  Bed Mobility Overal bed mobility: Needs Assistance Bed Mobility: Supine to Sit     Supine to sit: Mod assist     General bed mobility comments: assist to raise trunk    Transfers Overall transfer level: Needs assistance Equipment used: Rolling walker (2  wheels) Transfers: Sit to/from Stand Sit to Stand: From elevated surface, Min assist           General transfer comment: VCs hand placement (pt did not follow instructions for this and pulled up on RW with both hands). It took 3 attempts for pt to successfully come to full upright standing position. ONce in standing pt stated she was unable to take any steps due to pain, I encouraged her to attempt side stepping at edge of bed but she also said she was unable to do so and returned to sitting at edge of bed.    Ambulation/Gait Ambulation/Gait assistance: Min guard Gait Distance (Feet): 12 Feet Assistive device: Rolling walker (2 wheels) Gait Pattern/deviations: Step-to pattern Gait velocity: decr     General Gait Details: VCs sequencing, distance limited by pain   Stairs             Wheelchair Mobility    Modified Rankin (Stroke Patients Only)       Balance Overall balance assessment: Needs assistance Sitting-balance support: Feet supported, No upper extremity supported Sitting balance-Leahy Scale: Fair     Standing balance support: Bilateral upper extremity supported Standing balance-Leahy Scale: Poor                              Cognition Arousal/Alertness: Awake/alert Behavior During Therapy: WFL for tasks assessed/performed Overall Cognitive Status: Within Functional Limits for tasks assessed                                          Exercises Total Joint  Exercises Ankle Circles/Pumps: Both, AROM, 20 reps, Supine Short Arc Quad: AROM, Right, 10 reps, Supine Heel Slides: AAROM, Right, Supine, 5 reps Hip ABduction/ADduction: AAROM, Right, 10 reps, Supine    General Comments        Pertinent Vitals/Pain Pain Assessment Pain Assessment: 0-10 Pain Score: 7  Pain Location: R hip Pain Descriptors / Indicators: Grimacing, Guarding Pain Intervention(s): Limited activity within patient's tolerance, Monitored during session,  Premedicated before session, Patient requesting pain meds-RN notified, Ice applied, Repositioned    Home Living Family/patient expects to be discharged to:: Skilled nursing facility Living Arrangements: Alone                 Additional Comments: pt reports she does not have 24/7 assistance available at home    Prior Function            PT Goals (current goals can now be found in the care plan section) Acute Rehab PT Goals Patient Stated Goal: to DC home PT Goal Formulation: With patient Time For Goal Achievement: 12/12/21 Potential to Achieve Goals: Good Progress towards PT goals: Progressing toward goals    Frequency    Min 3X/week      PT Plan Current plan remains appropriate    Co-evaluation              AM-PAC PT "6 Clicks" Mobility   Outcome Measure  Help needed turning from your back to your side while in a flat bed without using bedrails?: A Little Help needed moving from lying on your back to sitting on the side of a flat bed without using bedrails?: A Lot Help needed moving to and from a bed to a chair (including a wheelchair)?: A Little Help needed standing up from a chair using your arms (e.g., wheelchair or bedside chair)?: A Little Help needed to walk in hospital room?: A Lot Help needed climbing 3-5 steps with a railing? : Total 6 Click Score: 14    End of Session Equipment Utilized During Treatment: Gait belt Activity Tolerance: Patient limited by pain Patient left: with call bell/phone within reach;in bed;with bed alarm set Nurse Communication: Mobility status PT Visit Diagnosis: Difficulty in walking, not elsewhere classified (R26.2);Pain;History of falling (Z91.81) Pain - Right/Left: Right Pain - part of body: Hip     Time: 5009-3818 PT Time Calculation (min) (ACUTE ONLY): 13 min  Charges:  $Therapeutic Activity: 8-22 mins                     Blondell Reveal Kistler PT 11/28/2021  Acute Rehabilitation Services  Office  [phone]

## 2021-11-28 NOTE — Evaluation (Signed)
Physical Therapy Evaluation Patient Details Name: Victoria Levine MRN: [id number] DOB: 06/10/1950 Today's Date: 11/28/2021  History of Present Illness  71 y.o. female with a displaced Right femoral neck fracture, s/p R DA-THA 11/27/21. PMH: alcohol abuse, bipolar disorder, osteoporosis, anxiety, AKI.  Clinical Impression  Pt admitted with above diagnosis. Pt required mod assist for supine to sit. She was able to ambulate 12' with a RW, distance limited by R hip pain. Pt reports she lives alone and does not have 24/7 assistance available. ST-SNF recommended.  Pt currently with functional limitations due to the deficits listed below (see PT Problem List). Pt will benefit from skilled PT to increase their independence and safety with mobility to allow discharge to the venue listed below.          Recommendations for follow up therapy are one component of a multi-disciplinary discharge planning process, led by the attending physician.  Recommendations may be updated based on patient status, additional functional criteria and insurance authorization.  Follow Up Recommendations Skilled nursing-short term rehab (<3 hours/day) Can patient physically be transported by private vehicle: Yes    Assistance Recommended at Discharge Intermittent Supervision/Assistance  Patient can return home with the following  A little help with walking and/or transfers;A little help with bathing/dressing/bathroom;Assistance with cooking/housework;Assist for transportation;Help with stairs or ramp for entrance    Equipment Recommendations None recommended by PT  Recommendations for Other Services       Functional Status Assessment Patient has had a recent decline in their functional status and demonstrates the ability to make significant improvements in function in a reasonable and predictable amount of time.     Precautions / Restrictions Precautions Precautions: Fall Precaution Comments: 2 falls in past 6  months (including fall just PTA) Restrictions Weight Bearing Restrictions: No RLE Weight Bearing: Weight bearing as tolerated      Mobility  Bed Mobility Overal bed mobility: Needs Assistance Bed Mobility: Supine to Sit     Supine to sit: Mod assist     General bed mobility comments: assist to raise trunk    Transfers Overall transfer level: Needs assistance Equipment used: Rolling walker (2 wheels) Transfers: Sit to/from Stand Sit to Stand: From elevated surface, Min assist           General transfer comment: VCs hand placement (pt did not follow instructions for this and pulled up on RW with both hands)    Ambulation/Gait Ambulation/Gait assistance: Min guard Gait Distance (Feet): 12 Feet Assistive device: Rolling walker (2 wheels) Gait Pattern/deviations: Step-to pattern Gait velocity: decr     General Gait Details: VCs sequencing, distance limited by pain  Stairs            Wheelchair Mobility    Modified Rankin (Stroke Patients Only)       Balance Overall balance assessment: Needs assistance Sitting-balance support: Feet supported, No upper extremity supported Sitting balance-Leahy Scale: Fair     Standing balance support: Bilateral upper extremity supported Standing balance-Leahy Scale: Poor                               Pertinent Vitals/Pain Pain Assessment Pain Assessment: 0-10 Pain Score: 7  Pain Location: R hip Pain Descriptors / Indicators: Grimacing, Guarding Pain Intervention(s): Limited activity within patient's tolerance, Monitored during session, Premedicated before session, Ice applied    Home Living Family/patient expects to be discharged to:: Skilled nursing facility Living Arrangements: Alone  Additional Comments: pt reports she does not have 24/7 assistance available at home    Prior Function Prior Level of Function : Independent/Modified Independent             Mobility  Comments: used a rollator ADLs Comments: ADLs, IADLs, driving, and has a lady come clean once a month     Hand Dominance        Extremity/Trunk Assessment   Upper Extremity Assessment Upper Extremity Assessment: Overall WFL for tasks assessed    Lower Extremity Assessment Lower Extremity Assessment: RLE deficits/detail RLE Deficits / Details: knee ext -3/5, hip AAROM ~35* flexion limited by pain RLE Sensation: WNL RLE Coordination: WNL    Cervical / Trunk Assessment Cervical / Trunk Assessment: Kyphotic  Communication   Communication: No difficulties  Cognition Arousal/Alertness: Awake/alert Behavior During Therapy: WFL for tasks assessed/performed Overall Cognitive Status: Within Functional Limits for tasks assessed                                          General Comments      Exercises Total Joint Exercises Ankle Circles/Pumps: Both, AROM, 20 reps, Supine Heel Slides: AAROM, Right, 10 reps, Supine Hip ABduction/ADduction: AAROM, Right, 10 reps, Supine   Assessment/Plan    PT Assessment Patient needs continued PT services  PT Problem List Decreased strength;Decreased mobility;Decreased activity tolerance;Decreased balance;Pain;Decreased knowledge of use of DME       PT Treatment Interventions Gait training;Therapeutic exercise;Functional mobility training;Patient/family education    PT Goals (Current goals can be found in the Care Plan section)  Acute Rehab PT Goals Patient Stated Goal: to DC home PT Goal Formulation: With patient Time For Goal Achievement: 12/12/21 Potential to Achieve Goals: Good    Frequency Min 3X/week     Co-evaluation               AM-PAC PT "6 Clicks" Mobility  Outcome Measure Help needed turning from your back to your side while in a flat bed without using bedrails?: A Little Help needed moving from lying on your back to sitting on the side of a flat bed without using bedrails?: A Lot Help needed  moving to and from a bed to a chair (including a wheelchair)?: A Little Help needed standing up from a chair using your arms (e.g., wheelchair or bedside chair)?: A Little Help needed to walk in hospital room?: A Little Help needed climbing 3-5 steps with a railing? : Total 6 Click Score: 15    End of Session Equipment Utilized During Treatment: Gait belt Activity Tolerance: Patient limited by pain Patient left: in chair;with chair alarm set;with call bell/phone within reach Nurse Communication: Mobility status PT Visit Diagnosis: Difficulty in walking, not elsewhere classified (R26.2);Pain;History of falling (Z91.81) Pain - Right/Left: Right Pain - part of body: Hip    Time: 9528-4132 PT Time Calculation (min) (ACUTE ONLY): 17 min   Charges:   PT Evaluation $PT Eval Moderate Complexity: 1 Mod         Philomena Doheny PT 11/28/2021  Acute Rehabilitation Services  Office [phone]

## 2021-11-28 NOTE — TOC PASRR Note (Cosign Needed Addendum)
[place] Note   Patient Details  Name: Victoria Levine Date of Birth: 09/13/1950   Transition of Care [facility]) CM/SW Contact:    Ross Ludwig, LCSW Phone Number: 11/28/2021, 6:29 PM  To Whom It May Concern:  Please be advised that this patient will require a short-term nursing home stay - anticipated 30 days or less for rehabilitation and strengthening.   The plan is for return home.

## 2021-11-28 NOTE — Plan of Care (Signed)
  Problem: Education: Goal: Knowledge of General Education information will improve Description: Including pain rating scale, medication(s)/side effects and non-pharmacologic comfort measures Outcome: Progressing   Problem: Pain Managment: Goal: General experience of comfort will improve Outcome: Progressing   Problem: Safety: Goal: Ability to remain free from injury will improve Outcome: Progressing   Problem: Education: Goal: Knowledge of the prescribed therapeutic regimen will improve Outcome: Progressing   

## 2021-11-28 NOTE — TOC Initial Note (Signed)
Transition of Care [facility]) - Initial/Assessment Note    Patient Details  Name: Victoria Levine MRN: [id number] Date of Birth: 04/29/1950  Transition of Care [facility]) CM/SW Contact:    Ross Ludwig, LCSW Phone Number: 11/28/2021, 6:35 PM  Clinical Narrative:                  Patient is a 71 year old female who is alert and oriented x4.  Patient lives alone, and is hesitant about going to SNF, but agreeable per PT note.  Patient has been to SNF before, CSW completed assessment by reviewing chart due to patient sleeping.  CSW to begin bed search in [facility].  Patient is a Level 2 [place] screen, and will also need insurance authorization.    Level 2 [place] paperwork uploaded to [facility] MUST at 6:28pm on November 28, 2021.  Expected Discharge Plan: Skilled Nursing Facility Barriers to Discharge: Ship broker, Continued Medical Work up, Environmental education officer)   Patient Goals and CMS Choice Patient states their goals for this hospitalization and ongoing recovery are:: To go to SNF for rehab, then return back home. CMS Medicare.gov Compare Post Acute Care list provided to:: Patient Choice offered to / list presented to : Patient  Expected Discharge Plan and Services Expected Discharge Plan: [place] arrangements for the past 2 months: Single Family Home                                      Prior Living Arrangements/Services Living arrangements for the past 2 months: Single Family Home Lives with:: Self Patient language and need for interpreter reviewed:: Yes Do you feel safe going back to the place where you live?: No   Patient feels she needs short term rehab before returning back home.  Need for Family Participation in Patient Care: No (Comment) Care giver support system in place?: No (comment)   Criminal Activity/Legal Involvement Pertinent to Current Situation/Hospitalization: No - Comment as needed  Activities  of Daily Living Home Assistive Devices/Equipment: Walker (specify type), Cane (specify quad or straight) ADL Screening (condition at time of admission) Patient's cognitive ability adequate to safely complete daily activities?: Yes Is the patient deaf or have difficulty hearing?: No Does the patient have difficulty seeing, even when wearing glasses/contacts?: No Does the patient have difficulty concentrating, remembering, or making decisions?: No Patient able to express need for assistance with ADLs?: Yes Does the patient have difficulty dressing or bathing?: No Independently performs ADLs?: Yes (appropriate for developmental age) Does the patient have difficulty walking or climbing stairs?: Yes Weakness of Legs: None Weakness of Arms/Hands: None  Permission Sought/Granted Permission sought to share information with : Case Manager, Family Supports Permission granted to share information with : Yes, Release of Information Signed  Share Information with NAME: Kemiya, [place]   [phone]  Permission granted to share info w AGENCY: SNF admissions        Emotional Assessment Appearance:: Appears stated age Attitude/Demeanor/Rapport: Engaged Affect (typically observed): Calm, Accepting Orientation: : Oriented to Self, Oriented to Place, Oriented to  Time, Oriented to Situation Alcohol / Substance Use: Not Applicable Psych Involvement: Yes (comment)  Admission diagnosis:  Hip fracture ([place]) [S72.009A] Hypoxia [R09.02] Closed fracture of right hip, initial encounter [facility]) [S72.001A] Patient Active Problem List   Diagnosis Date Noted   Hip fracture ([place]) 11/26/2021   Acute  hypoxemic respiratory failure ([place]) 11/26/2021   Depression 11/26/2021   Chronic anemia 11/26/2021   Hyponatremia 11/26/2021   Diarrhea 01/18/2021   Generalized weakness 01/18/2021   Lactic acidosis 01/14/2021   Sepsis ([place]) 01/14/2021   Hypercalcemia 01/14/2021   Leukocytosis 01/14/2021   Thrombocytosis  01/14/2021   Dehydration 01/14/2021   Transaminitis 01/14/2021   Acute kidney injury ([place]) 01/13/2021   Encephalopathy 03/09/2020   Pressure injury of skin 38/25/0539   Acute metabolic encephalopathy 76/73/4193   Displaced intertrochanteric fracture of left femur, initial encounter for closed fracture ([place]) 02/23/2020   Hypokalemia 02/23/2020   Intractable nausea and vomiting 08/31/2019   Epigastric pain 08/31/2019   Alcohol abuse 08/31/2019   Microcytic anemia 08/31/2019   Closed compression fracture of L2 vertebra ([place]) 12/11/2017   Chronic pain 07/26/2015   Anxiety state 06/28/2015   Benzodiazepine dependence ([place]) 10/13/2014   Opioid abuse with opioid-induced mood disorder ([place]) 10/13/2014   Bipolar 1 disorder, depressed ([place]) 10/12/2014   Spondylolisthesis of lumbar region 07/28/2014   Osteoporosis 11/17/2013   Migraines    Herpes    Hypothyroidism 08/13/2008   HYPERCHOLESTEROLEMIA 08/13/2008   Bipolar disorder ([place]) 08/13/2008   Asthma-chronic obstructive pulmonary disease overlap syndrome ([place]) 08/13/2008   GERD (gastroesophageal reflux disease) 08/13/2008   HIATAL HERNIA 08/13/2008   ALLERGY 08/13/2008   PCP:  [name], MD Pharmacy:   [facility] [place], [place] - [address] AT [place] [address] Phone: [phone] Fax: [phone]  [id number] [place] [id number]: [phone]: [phone]    Social Determinants of Health (SDOH) Interventions    Readmission Risk Interventions    02/25/2020    3:19 PM  Readmission Risk Prevention Plan  Transportation Screening Complete  Medication Review ([place] Complete  PCP or Specialist appointment within 3-5 days of discharge Complete  SW Recovery Care/Counseling Consult Complete  Skilled [place]

## 2021-11-28 NOTE — TOC CM/SW Note (Signed)
[place] Note   Patient Details  Name: Victoria Levine Date of Birth: May 22, 1950   Transition of Care [facility]) CM/SW Contact:    Ross Ludwig, LCSW Phone Number: 11/28/2021, 6:00 PM  To Whom It May Concern:  Please be advised that this patient will require a short-term nursing home stay - anticipated 30 days or less for rehabilitation and strengthening.   The plan is for return home.

## 2021-11-29 DIAGNOSIS — S72001A Fracture of unspecified part of neck of right femur, initial encounter for closed fracture: Secondary | ICD-10-CM | POA: Diagnosis not present

## 2021-11-29 DIAGNOSIS — J9601 Acute respiratory failure with hypoxia: Secondary | ICD-10-CM | POA: Diagnosis not present

## 2021-11-29 LAB — CBC
HCT: 29.1 % — ABNORMAL LOW (ref 36.0–46.0)
Hemoglobin: 8.7 g/dL — ABNORMAL LOW (ref 12.0–15.0)
MCH: 26.9 pg (ref 26.0–34.0)
MCHC: 29.9 g/dL — ABNORMAL LOW (ref 30.0–36.0)
MCV: 89.8 fL (ref 80.0–100.0)
Platelets: 213 10*3/uL (ref 150–400)
RBC: 3.24 MIL/uL — ABNORMAL LOW (ref 3.87–5.11)
RDW: 19.1 % — ABNORMAL HIGH (ref 11.5–15.5)
WBC: 8.8 10*3/uL (ref 4.0–10.5)
nRBC: 0 % (ref 0.0–0.2)

## 2021-11-29 MED ORDER — LORAZEPAM 2 MG/ML IJ SOLN
0.5000 mg | Freq: Once | INTRAMUSCULAR | Status: AC
  Administered 2021-11-29: 0.5 mg via INTRAVENOUS
  Filled 2021-11-29: qty 1

## 2021-11-29 NOTE — Plan of Care (Signed)
Problem: Education: Goal: Knowledge of General Education information will improve Description: Including pain rating scale, medication(s)/side effects and non-pharmacologic comfort measures Outcome: Progressing   Problem: Clinical Measurements: Goal: Ability to maintain clinical measurements within normal limits will improve Outcome: Progressing   Problem: Pain Managment: Goal: General experience of comfort will improve Outcome: [place] V Safira Proffit, RN 11/29/21 8:38 AM

## 2021-11-29 NOTE — Plan of Care (Signed)

## 2021-11-29 NOTE — Progress Notes (Signed)
    Subjective:  Patient reports pain as mild to moderate.  Denies N/V/CP/SOB/Abd pain. She states she is having more pain today than yesterday in her thigh. We discussed thigh pain is normal after surgery. We discussed using ice to help with pain and swelling. She denies tinging and numbness in LE bilaterally.   Objective:   VITALS:   Vitals:   11/28/21 1453 11/28/21 1809 11/28/21 2236 11/29/21 0517  BP: (!) 140/71 123/68 104/71 (!) 170/93  Pulse: 97 94 91 94  Resp: '18 16 16 18  '$ Temp: 98.9 F (37.2 C) 98 F (36.7 C)  98.9 F (37.2 C)  TempSrc: Oral Oral  Oral  SpO2: (!) 89% (!) 88% 90% 100%  Weight:      Height:        Patient ambulated to the bathroom.  Neurologically intact ABD soft Neurovascular intact Sensation intact distally Intact pulses distally Dorsiflexion/Plantar flexion intact Incision: dressing C/D/I No cellulitis present Compartment soft Bruising just distal to incision.   Lab Results  Component Value Date   WBC 8.8 11/29/2021   HGB 8.7 (L) 11/29/2021   HCT 29.1 (L) 11/29/2021   MCV 89.8 11/29/2021   PLT 213 11/29/2021   BMET    Component Value Date/Time   NA 135 11/27/2021 0325   K 3.9 11/27/2021 0325   CL 102 11/27/2021 0325   CO2 27 11/27/2021 0325   GLUCOSE 135 (H) 11/27/2021 0325   BUN 20 11/27/2021 0325   CREATININE 0.68 11/27/2021 0325   CREATININE 0.67 12/05/2012 1401   CALCIUM 9.4 11/27/2021 0325   CALCIUM 9.4 10/31/2011 1152   GFRNONAA >60 11/27/2021 0325     Assessment/Plan: 2 Days Post-Op   Principal Problem:   Hip fracture (HCC) Active Problems:   Hypothyroidism   GERD (gastroesophageal reflux disease)   Acute hypoxemic respiratory failure (HCC)   Depression   Chronic anemia   Hyponatremia   WBAT with walker DVT ppx: Aspirin, SCDs, TEDS PO pain control PT/OT She ambulated 12 feet with PT yesterday. Limited a little bit by pain.  Dispo: Patient is under care of the medical team. Disposition per their  recommendation. Discussion about SNF. Pain medication and DVT ppx printed in chart.    Charlott Rakes, PA-C 11/29/2021, 7:55 AM   [facility]  [place] [address], [address], [place], [place] [address] Phone: [phone] [email or web address] Facebook  [employer]

## 2021-11-29 NOTE — TOC Progression Note (Addendum)
Transition of Care [facility]) - Progression Note    Patient Details  Name: Victoria Levine MRN: [id number] Date of Birth: [birth year]  Transition of Care [facility] CM/SW Contact  Joaquin Courts, RN Phone Number: 11/29/2021, 1:24 PM  Clinical Narrative:    Patient is disoriented at this time and unable to meaningfully participate in conversation regarding bed offers.  CM spoke with next of kin, Brother, Victoria Levine and provided bed offers.  John reports he would like some time to make decision.  CM advised that will follow up in AM for choice.  John voices understanding and agreement.  PASRR received [id number]. Insurance auth to be initiated once bed is selected.   Expected Discharge Plan: [place] Barriers to Discharge: Ship broker, Continued Medical Work up, Environmental education officer)  Expected Discharge Plan and Services Expected Discharge Plan: [place] arrangements for the past 2 months: Single Family Home                                       Social Determinants of Health (SDOH) Interventions    Readmission Risk Interventions    02/25/2020    3:19 PM  Readmission Risk Prevention Plan  Transportation Screening Complete  Medication Review (RN Care Manager) Complete  PCP or Specialist appointment within 3-5 days of discharge Complete  SW Recovery Care/Counseling Consult Complete  Skilled Nursing Facility Complete

## 2021-11-29 NOTE — Progress Notes (Signed)
Progress Note    Victoria Levine   HBZ:[id number]  DOB: 1950-08-03  DOA: 11/26/2021     3 PCP: [name], MD  Initial CC: fall  Hospital Course: Victoria Levine is a 71 y.o. female with medical history significant of hypothyroidism, hypertension and depression who presented after suffering of a mechanical fall.  She was ambulating from the bathroom back to the bedroom when she slipped and fell. Work-up revealed a right hip fracture and she underwent right total hip arthroplasty with orthopedic surgery on 11/27/2021.  Interval History:  No events overnight.  Resting in bed comfortably when seen.  Still having some ongoing discomfort in her right hip as expected otherwise recovering as expected.  Assessment and Plan: * Closed right hip fracture ([place]) - S/p mechanical fall at home - Underwent right total hip arthroplasty with orthopedic surgery on 11/27/2021 - Continue pain control - Continue working with PT - Plan is for SNF at discharge  Acute hypoxemic respiratory failure (HCC)-resolved as of 11/29/2021 - Suspected due to some probable atelectasis - Patient has been weaned back down to room air  Hyponatremia-resolved as of 11/29/2021 - Mild and resolved with fluids - Continue diet  Normocytic anemia - Baseline hemoglobin appears to be around 11 to 12 g/dL - Some downtrend expected postop in setting of acute blood loss - Hemoglobin has been stable  Depression Bipolar disorder.  Continue with oxcarbazepine, bupropion, fluoxetine and quetiapine.   GERD (gastroesophageal reflux disease) Continue antiacid therapy with pantoprazole and famotidine Patient has a large hiatal hernia.   Hypothyroidism Continue with levothyroxine.    Old records reviewed in assessment of this patient  Antimicrobials:   DVT prophylaxis:  SCDs Start: 11/27/21 2003 SCDs Start: 11/26/21 1012   Code Status:   Code Status: Full Code  Mobility Assessment (last 72 hours)      Mobility Assessment     Row Name 11/29/21 1425 11/28/21 2255 11/28/21 1351 11/28/21 1116 11/27/21 2106   Does patient have an order for bedrest or is patient medically unstable -- No - Continue assessment -- -- No - Continue assessment   What is the highest level of mobility based on the progressive mobility assessment? Level 3 (Stands with assist) - Balance while standing  and cannot march in place Level 3 (Stands with assist) - Balance while standing  and cannot march in place Level 3 (Stands with assist) - Balance while standing  and cannot march in place Level 4 (Walks with assist in room) - Balance while marching in place and cannot step forward and back - Complete Level 3 (Stands with assist) - Balance while standing  and cannot march in place   Is the above level different from baseline mobility prior to current illness? -- Yes - Recommend PT order -- -- Yes - Recommend PT order    Row Name 11/27/21 0900 11/27/21 0403 11/26/21 2336 11/26/21 2033     Does patient have an order for bedrest or is patient medically unstable Yes- Bedfast (Level 1) - Complete Yes- Bedfast (Level 1) - Complete  Fracture - bedrest until surgery Yes- Bedfast (Level 1) - Complete  Fracture - bedrest until surgery Yes- Bedfast (Level 1) - Complete  Fracture - bedrest until surgery             Barriers to discharge: none Disposition Plan:  SNF Status is: Inpt  Objective: Blood pressure (!) 151/91, pulse 89, temperature 99.1 F (37.3 C), resp. rate 15, height '5\' 6"'$  (1.676 m),  weight 69.9 kg, SpO2 (!) 87 %.  Examination:  Physical Exam Constitutional:      Appearance: Normal appearance.  HENT:     Head: Normocephalic and atraumatic.     Mouth/Throat:     Mouth: Mucous membranes are moist.  Eyes:     Extraocular Movements: Extraocular movements intact.  Cardiovascular:     Rate and Rhythm: Normal rate and regular rhythm.  Pulmonary:     Effort: Pulmonary effort is normal.     Breath sounds: Normal  breath sounds.  Abdominal:     General: Bowel sounds are normal. There is no distension.     Palpations: Abdomen is soft.     Tenderness: There is no abdominal tenderness.  Musculoskeletal:     Cervical back: Normal range of motion and neck supple.     Comments: Right hip noted with surgical dressing in place.  Compartments soft.  Neurological:     Mental Status: She is alert.      Consultants:  Orthopedic surgery  Procedures:  Right total hip arthroplasty, 11/27/2021  Data Reviewed: Results for orders placed or performed during the hospital encounter of 11/26/21 (from the past 24 hour(s))  CBC     Status: Abnormal   Collection Time: 11/29/21  3:38 AM  Result Value Ref Range   WBC 8.8 4.0 - 10.5 K/uL   RBC 3.24 (L) 3.87 - 5.11 MIL/uL   Hemoglobin 8.7 (L) 12.0 - 15.0 g/dL   HCT 29.1 (L) 36.0 - 46.0 %   MCV 89.8 80.0 - 100.0 fL   MCH 26.9 26.0 - 34.0 pg   MCHC 29.9 (L) 30.0 - 36.0 g/dL   RDW 19.1 (H) 11.5 - 15.5 %   Platelets 213 150 - 400 K/uL   nRBC 0.0 0.0 - 0.2 %    I have Reviewed nursing notes, Vitals, and Lab results since pt's last encounter. Pertinent lab results : see above I have ordered test including BMP, CBC, Mg I have reviewed the last note from staff over past 24 hours I have discussed pt's care plan and test results with nursing staff, case manager   LOS: 3 days   Dwyane Dee, MD [facility] 11/29/2021, 3:25 PM

## 2021-11-29 NOTE — Progress Notes (Signed)
CSW gave Pt Medicare. Gov facilities so the Pt can pick a bed offer.

## 2021-11-29 NOTE — Progress Notes (Signed)
Victoria Levine POA called into nursing station for update. He can be reached at [phone] and states that he is available to be called anytime. He desires to be actively involved in patient's care planning, especially since her mental status fluctuates.   Victoria Levine expressed concern that he does not know how to choose a SNF. This RN suggested that he look at star ratings online. He verbalized appreciation and stated that he would do this for the list of facilities where there are bed offers. Victoria Levine verbalized understanding that Case Management would like to get names of preferred places today if at all possible.  Victoria Levine and Narda Amber with Case Management department notified of this conversation for follow-up as needed.  Ivan Anchors, RN 11/29/21 1:53 PM

## 2021-11-29 NOTE — Progress Notes (Signed)
Physical Therapy Treatment Patient Details Name: Victoria Levine MRN: [id number] DOB: 28-Aug-1950 Today's Date: 11/29/2021   History of Present Illness Victoria Levine is a 71 yr old female admitted to the hospital after a mechanical fall at home. She was found to have a displaced R femoral neck fracture & is s/p a R THA (anterior approach) on 11-27-2021. PMH: hypothyroidism, HTN, depression, bipolar disorder, OP, DDD, lumbar compression fracture    PT Comments    Pt is not progressing well with mobility, she was unable to ambulate today due to pain. She required mod assist for sit to stand and to pivot to a recliner. She has decreased safety awareness as she attempted to sit on the bed and on the recliner while still a significant distance from them.    Recommendations for follow up therapy are one component of a multi-disciplinary discharge planning process, led by the attending physician.  Recommendations may be updated based on patient status, additional functional criteria and insurance authorization.  Follow Up Recommendations  Skilled nursing-short term rehab (<3 hours/day)     Assistance Recommended at Discharge Intermittent Supervision/Assistance  Patient can return home with the following A little help with bathing/dressing/bathroom;Assistance with cooking/housework;Assist for transportation;Help with stairs or ramp for entrance;A lot of help with walking and/or transfers   Equipment Recommendations  None recommended by PT    Recommendations for Other Services       Precautions / Restrictions Precautions Precautions: Fall Precaution Comments: 2 falls in past 6 months (including fall just PTA) Restrictions Weight Bearing Restrictions: No RLE Weight Bearing: Weight bearing as tolerated     Mobility  Bed Mobility Overal bed mobility: Needs Assistance Bed Mobility: Supine to Sit           General bed mobility comments: mod assist to raise trunk    Transfers Overall  transfer level: Needs assistance Equipment used: Rolling walker (2 wheels) Transfers: Sit to/from Stand, Bed to chair/wheelchair/BSC Sit to Stand: From elevated surface, Mod assist Stand pivot transfers: Mod assist         General transfer comment: sit to stand x 3 trials mod A to come to full upright position, verbal/manual cues for hand placement; pt with decreased safety awareness, attempted to sit on bed after having taken a step away from it, and attempted to sit on recliner prior to fully reaching it during SPT. MOd A to guide hips to recliner as pt attempted to sit prior to reaching recliner    Ambulation/Gait               General Gait Details: declined 2* pain with stand pivot transfer   Stairs             Wheelchair Mobility    Modified Rankin (Stroke Patients Only)       Balance   Sitting-balance support: Feet supported Sitting balance-Leahy Scale: Fair       Standing balance-Leahy Scale: Poor Standing balance comment: unable to assess                            Cognition Arousal/Alertness: Awake/alert Behavior During Therapy: WFL for tasks assessed/performed Overall Cognitive Status: No family/caregiver present to determine baseline cognitive functioning Area of Impairment: Safety/judgement                         Safety/Judgement: Decreased awareness of safety, Decreased awareness of deficits     General  Comments: multiple times pt attempted to sit prior to reaching bed/chair; she was oriented to month/year but not date, oriented to self, stated she's "at [facility] hospital bc she had an accident that made her leg hurt"        Exercises Total Joint Exercises Long Arc Quad: AAROM, Right, 10 reps, Seated    General Comments        Pertinent Vitals/Pain Pain Assessment Pain Score: 10-Worst pain ever Pain Location: R hip Pain Descriptors / Indicators: Grimacing, Guarding Pain Intervention(s): Limited activity within  patient's tolerance, Monitored during session, Premedicated before session, Ice applied, Repositioned    Home Living                          Prior Function            PT Goals (current goals can now be found in the care plan section) Acute Rehab PT Goals Patient Stated Goal: to DC home PT Goal Formulation: With patient Time For Goal Achievement: 12/12/21 Potential to Achieve Goals: Good Progress towards PT goals: Not progressing toward goals - comment (decreased safety awareness, pain limiting activity tolerance)    Frequency    Min 3X/week      PT Plan Current plan remains appropriate    Co-evaluation              AM-PAC PT "6 Clicks" Mobility   Outcome Measure  Help needed turning from your back to your side while in a flat bed without using bedrails?: A Little Help needed moving from lying on your back to sitting on the side of a flat bed without using bedrails?: A Lot Help needed moving to and from a bed to a chair (including a wheelchair)?: A Lot Help needed standing up from a chair using your arms (e.g., wheelchair or bedside chair)?: A Lot Help needed to walk in hospital room?: A Lot Help needed climbing 3-5 steps with a railing? : Total 6 Click Score: 12    End of Session Equipment Utilized During Treatment: Gait belt Activity Tolerance: Patient limited by pain Patient left: with call bell/phone within reach;in chair;with chair alarm set Nurse Communication: Mobility status PT Visit Diagnosis: Difficulty in walking, not elsewhere classified (R26.2);Pain;History of falling (Z91.81) Pain - Right/Left: Right Pain - part of body: Hip     Time: 4034-7425 PT Time Calculation (min) (ACUTE ONLY): 14 min  Charges:  $Therapeutic Activity: 8-22 mins                     Blondell Reveal Kistler PT 11/29/2021  Acute Rehabilitation Services  Office [phone]

## 2021-11-29 NOTE — Anesthesia Postprocedure Evaluation (Signed)
Anesthesia Post Note  Patient: Victoria Levine  Procedure(s) Performed: TOTAL HIP ARTHROPLASTY ANTERIOR APPROACH (Right: Hip)     Patient location during evaluation: PACU Anesthesia Type: Spinal Level of consciousness: oriented and awake and alert Pain management: pain level controlled Vital Signs Assessment: post-procedure vital signs reviewed and stable Respiratory status: spontaneous breathing, respiratory function stable and nonlabored ventilation Cardiovascular status: blood pressure returned to baseline and stable Postop Assessment: no headache, no backache, no apparent nausea or vomiting and spinal receding Anesthetic complications: no   No notable events documented.  Last Vitals:  Vitals:   11/28/21 2236 11/29/21 0517  BP: 104/71 (!) 170/93  Pulse: 91 94  Resp: 16 18  Temp:  37.2 C  SpO2: 90% 100%    Last Pain:  Vitals:   11/29/21 0607  TempSrc:   PainSc: Asleep   Pain Goal: Patients Stated Pain Goal: 3 (11/29/21 0517)                 Lidia Collum

## 2021-11-29 NOTE — Hospital Course (Signed)
Victoria Levine is a 71 y.o. female with medical history significant of hypothyroidism, hypertension and depression who presented after suffering of a mechanical fall.  She was ambulating from the bathroom back to the bedroom when she slipped and fell. Work-up revealed a right hip fracture and she underwent right total hip arthroplasty with orthopedic surgery on 11/27/2021.

## 2021-11-30 DIAGNOSIS — S72001A Fracture of unspecified part of neck of right femur, initial encounter for closed fracture: Secondary | ICD-10-CM | POA: Diagnosis not present

## 2021-11-30 MED ORDER — CARVEDILOL 6.25 MG PO TABS
6.2500 mg | ORAL_TABLET | Freq: Two times a day (BID) | ORAL | Status: DC
  Administered 2021-11-30 – 2021-12-01 (×4): 6.25 mg via ORAL
  Filled 2021-11-30 (×4): qty 1

## 2021-11-30 NOTE — Progress Notes (Signed)
Physical Therapy Treatment Patient Details Name: Victoria Levine MRN: [id number] DOB: 03-14-1951 Today's Date: 11/30/2021   History of Present Illness Ms. [place] is a 71 yr old female admitted to the hospital after a mechanical fall at home. She was found to have a displaced R femoral neck fracture & is s/p a R THA (anterior approach) on 11-27-2021. PMH: hypothyroidism, HTN, depression, bipolar disorder, OP, DDD, lumbar compression fracture    PT Comments    Pt sleeping soundly upon my arrival, pt not oriented to location/situation upon waking up. Improved activity tolerance today compared to yesterday, pt ambulated 66' with RW, distance limited by pain. Pt performed R THA exercises with min assist.     Recommendations for follow up therapy are one component of a multi-disciplinary discharge planning process, led by the attending physician.  Recommendations may be updated based on patient status, additional functional criteria and insurance authorization.  Follow Up Recommendations  Skilled nursing-short term rehab (<3 hours/day) Can patient physically be transported by private vehicle: Yes   Assistance Recommended at Discharge Intermittent Supervision/Assistance  Patient can return home with the following A little help with bathing/dressing/bathroom;Assistance with cooking/housework;Assist for transportation;Help with stairs or ramp for entrance;A lot of help with walking and/or transfers   Equipment Recommendations  None recommended by PT    Recommendations for Other Services       Precautions / Restrictions Precautions Precautions: Fall Precaution Comments: 2 falls in past 6 months (including fall just PTA) Restrictions Weight Bearing Restrictions: No RLE Weight Bearing: Weight bearing as tolerated     Mobility  Bed Mobility Overal bed mobility: Needs Assistance Bed Mobility: Supine to Sit     Supine to sit: Min assist     General bed mobility comments: min A to  raise trunk    Transfers Overall transfer level: Needs assistance Equipment used: Rolling walker (2 wheels) Transfers: Sit to/from Stand Sit to Stand: From elevated surface, Min assist           General transfer comment: verbal/manual cues for hand placement; pt with decreased safety awareness, assist to power up    Ambulation/Gait Ambulation/Gait assistance: Min assist Gait Distance (Feet): 18 Feet Assistive device: Rolling walker (2 wheels) Gait Pattern/deviations: Step-to pattern, Decreased step length - right, Decreased step length - left Gait velocity: decr     General Gait Details: VCs for sequencing, distance limited by pain   Stairs             Wheelchair Mobility    Modified Rankin (Stroke Patients Only)       Balance   Sitting-balance support: Feet supported Sitting balance-Leahy Scale: Fair     Standing balance support: Reliant on assistive device for balance, Bilateral upper extremity supported, During functional activity Standing balance-Leahy Scale: Poor                              Cognition Arousal/Alertness: Awake/alert Behavior During Therapy: WFL for tasks assessed/performed Overall Cognitive Status: No family/caregiver present to determine baseline cognitive functioning Area of Impairment: Orientation, Safety/judgement                 Orientation Level: Place, Time, Situation, Disoriented to             General Comments: Pt stated she's at home.        Exercises Total Joint Exercises Ankle Circles/Pumps: Both, AROM, 20 reps, Supine Heel Slides: AAROM, Right, Supine, 10 reps Hip  ABduction/ADduction: AAROM, Right, 10 reps, Supine Long Arc Quad: AAROM, Right, 10 reps, Seated    General Comments        Pertinent Vitals/Pain Pain Assessment Pain Assessment: Faces Faces Pain Scale: Hurts even more Pain Location: R hip Pain Descriptors / Indicators: Grimacing, Guarding Pain Intervention(s): Limited  activity within patient's tolerance, Monitored during session, Repositioned    Home Living                          Prior Function            PT Goals (current goals can now be found in the care plan section) Acute Rehab PT Goals Patient Stated Goal: to DC home PT Goal Formulation: With patient Time For Goal Achievement: 12/12/21 Potential to Achieve Goals: Fair Progress towards PT goals: Progressing toward goals    Frequency    Min 3X/week      PT Plan Current plan remains appropriate    Co-evaluation              AM-PAC PT "6 Clicks" Mobility   Outcome Measure  Help needed turning from your back to your side while in a flat bed without using bedrails?: A Little Help needed moving from lying on your back to sitting on the side of a flat bed without using bedrails?: A Lot Help needed moving to and from a bed to a chair (including a wheelchair)?: A Little Help needed standing up from a chair using your arms (e.g., wheelchair or bedside chair)?: A Little Help needed to walk in hospital room?: A Little Help needed climbing 3-5 steps with a railing? : Total 6 Click Score: 15    End of Session Equipment Utilized During Treatment: Gait belt Activity Tolerance: Patient limited by pain Patient left: with call bell/phone within reach;in chair;with chair alarm set Nurse Communication: Mobility status PT Visit Diagnosis: Difficulty in walking, not elsewhere classified (R26.2);Pain;History of falling (Z91.81) Pain - Right/Left: Right Pain - part of body: Hip     Time: 3149-7026 PT Time Calculation (min) (ACUTE ONLY): 9 min  Charges:  $Gait Training: 8-22 mins                    Blondell Reveal Kistler PT 11/30/2021  Acute Rehabilitation Services  Office [phone]

## 2021-11-30 NOTE — TOC Progression Note (Signed)
Transition of Care [facility]) - Progression Note    Patient Details  Name: Victoria Levine MRN: [id number] Date of Birth: 05/05/1950  [facility] CM/SW Contact  Purcell Mouton, RN Phone Number: 11/30/2021, 12:22 PM  Clinical Narrative:    Spoke with pt and [name] concerning SNF.  [facility] was selected.  Will need insurance authorization.    Expected Discharge Plan: [place] Barriers to Discharge: Ship broker, Continued Medical Work up, Environmental education officer)  Expected Discharge Plan and Services Expected Discharge Plan: [place] arrangements for the past 2 months: Single Family Home                                       Social Determinants of Health (SDOH) Interventions    Readmission Risk Interventions    02/25/2020    3:19 PM  Readmission Risk Prevention Plan  Transportation Screening Complete  Medication Review (RN Care Manager) Complete  PCP or Specialist appointment within 3-5 days of discharge Complete  SW Recovery Care/Counseling Consult Complete  Skilled Nursing Facility Complete

## 2021-11-30 NOTE — Plan of Care (Signed)
  Problem: Health Behavior/Discharge Planning: Goal: Ability to manage health-related needs will improve Outcome: Progressing   Problem: Clinical Measurements: Goal: Ability to maintain clinical measurements within normal limits will improve Outcome: Progressing Goal: Will remain free from infection Outcome: Progressing Goal: Diagnostic test results will improve Outcome: Progressing Goal: Respiratory complications will improve Outcome: Progressing Goal: Cardiovascular complication will be avoided Outcome: Progressing   Problem: Activity: Goal: Risk for activity intolerance will decrease Outcome: Progressing   Problem: Nutrition: Goal: Adequate nutrition will be maintained Outcome: Progressing   Problem: Coping: Goal: Level of anxiety will decrease Outcome: Progressing   Problem: Elimination: Goal: Will not experience complications related to bowel motility Outcome: Progressing Goal: Will not experience complications related to urinary retention Outcome: Progressing   Problem: Pain Managment: Goal: General experience of comfort will improve Outcome: Progressing   Problem: Safety: Goal: Ability to remain free from injury will improve Outcome: Progressing   Problem: Skin Integrity: Goal: Risk for impaired skin integrity will decrease Outcome: Progressing   Problem: Education: Goal: Knowledge of the prescribed therapeutic regimen will improve Outcome: Progressing Goal: Understanding of discharge needs will improve Outcome: Progressing Goal: Individualized Educational Video(s) Outcome: Progressing   Problem: Activity: Goal: Ability to avoid complications of mobility impairment will improve Outcome: Progressing Goal: Ability to tolerate increased activity will improve Outcome: Progressing   Problem: Clinical Measurements: Goal: Postoperative complications will be avoided or minimized Outcome: Progressing   Problem: Pain Management: Goal: Pain level will  decrease with appropriate interventions Outcome: Progressing   Problem: Skin Integrity: Goal: Will show signs of wound healing Outcome: Progressing

## 2021-11-30 NOTE — Care Management Important Message (Signed)
Important Message  Patient Details IM Letter given to the Patient. Name: Victoria Levine MRN: [id number] Date of Birth: Mar 09, 1951   Medicare Important Message Given:  Yes     Kerin Salen 11/30/2021, 11:02 AM

## 2021-11-30 NOTE — Progress Notes (Signed)
Progress Note    Victoria Levine   [id number]:[id number]  DOB: 1951/03/01  DOA: 11/26/2021     4 PCP: [name], MD  Initial CC: fall  Hospital Course: TATUMN CORBRIDGE is a 71 y.o. female with medical history significant of hypothyroidism, hypertension and depression who presented after suffering of a mechanical fall.  She was ambulating from the bathroom back to the bedroom when she slipped and fell. Work-up revealed a right hip fracture and she underwent right total hip arthroplasty with orthopedic surgery on 11/27/2021.  Interval History:  No events overnight. More alert this morning. She states her son is reviewing SNF facilities today.   Assessment and Plan: * Closed right hip fracture ([place]) - S/p mechanical fall at home - Underwent right total hip arthroplasty with orthopedic surgery on 11/27/2021 - Continue pain control - Continue working with PT - Plan is for SNF at discharge  Acute hypoxemic respiratory failure (HCC)-resolved as of 11/29/2021 - Suspected due to some probable atelectasis - Patient has been weaned back down to room air  Hyponatremia-resolved as of 11/29/2021 - Mild and resolved with fluids - Continue diet  Normocytic anemia - Baseline hemoglobin appears to be around 11 to 12 g/dL - Some downtrend expected postop in setting of acute blood loss - Hemoglobin has been stable  Depression Bipolar disorder.  Continue with oxcarbazepine, bupropion, fluoxetine and quetiapine.   GERD (gastroesophageal reflux disease) Continue antiacid therapy with pantoprazole and famotidine Patient has a large hiatal hernia.   Hypothyroidism Continue with levothyroxine.    Old records reviewed in assessment of this patient  Antimicrobials:   DVT prophylaxis:  SCDs Start: 11/27/21 2003 SCDs Start: 11/26/21 1012   Code Status:   Code Status: Full Code  Mobility Assessment (last 72 hours)     Mobility Assessment     Row Name 11/29/21 1425 11/28/21 2255  11/28/21 1351 11/28/21 1116 11/27/21 2106   Does patient have an order for bedrest or is patient medically unstable -- No - Continue assessment -- -- No - Continue assessment   What is the highest level of mobility based on the progressive mobility assessment? Level 3 (Stands with assist) - Balance while standing  and cannot march in place Level 3 (Stands with assist) - Balance while standing  and cannot march in place Level 3 (Stands with assist) - Balance while standing  and cannot march in place Level 4 (Walks with assist in room) - Balance while marching in place and cannot step forward and back - Complete Level 3 (Stands with assist) - Balance while standing  and cannot march in place   Is the above level different from baseline mobility prior to current illness? -- Yes - Recommend PT order -- -- Yes - Recommend PT order            Barriers to discharge: none Disposition Plan:  SNF Status is: Inpt  Objective: Blood pressure (!) 186/101, pulse 96, temperature (!) 100.5 F (38.1 C), temperature source Oral, resp. rate 18, height '5\' 6"'$  (1.676 m), weight 69.9 kg, SpO2 92 %.  Examination:  Physical Exam Constitutional:      Appearance: Normal appearance.  HENT:     Head: Normocephalic and atraumatic.     Mouth/Throat:     Mouth: Mucous membranes are moist.  Eyes:     Extraocular Movements: Extraocular movements intact.  Cardiovascular:     Rate and Rhythm: Normal rate and regular rhythm.  Pulmonary:     Effort: Pulmonary  effort is normal.     Breath sounds: Normal breath sounds.  Abdominal:     General: Bowel sounds are normal. There is no distension.     Palpations: Abdomen is soft.     Tenderness: There is no abdominal tenderness.  Musculoskeletal:     Cervical back: Normal range of motion and neck supple.     Comments: Right hip noted with surgical dressing in place.  Compartments soft.  Neurological:     Mental Status: She is alert.      Consultants:  Orthopedic  surgery  Procedures:  Right total hip arthroplasty, 11/27/2021  Data Reviewed: No results found for this or any previous visit (from the past 24 hour(s)).   I have Reviewed nursing notes, Vitals, and Lab results since pt's last encounter. Pertinent lab results : see above I have reviewed the last note from staff over past 24 hours I have discussed pt's care plan and test results with nursing staff, case manager   LOS: 4 days   Dwyane Dee, MD [facility] 11/30/2021, 2:35 PM

## 2021-12-01 DIAGNOSIS — E039 Hypothyroidism, unspecified: Secondary | ICD-10-CM | POA: Diagnosis not present

## 2021-12-01 DIAGNOSIS — R41841 Cognitive communication deficit: Secondary | ICD-10-CM | POA: Diagnosis not present

## 2021-12-01 DIAGNOSIS — Z4789 Encounter for other orthopedic aftercare: Secondary | ICD-10-CM | POA: Diagnosis not present

## 2021-12-01 DIAGNOSIS — Z96641 Presence of right artificial hip joint: Secondary | ICD-10-CM | POA: Diagnosis not present

## 2021-12-01 DIAGNOSIS — Z9181 History of falling: Secondary | ICD-10-CM | POA: Diagnosis not present

## 2021-12-01 DIAGNOSIS — S72002D Fracture of unspecified part of neck of left femur, subsequent encounter for closed fracture with routine healing: Secondary | ICD-10-CM | POA: Diagnosis not present

## 2021-12-01 DIAGNOSIS — S72001S Fracture of unspecified part of neck of right femur, sequela: Secondary | ICD-10-CM | POA: Diagnosis not present

## 2021-12-01 DIAGNOSIS — Z471 Aftercare following joint replacement surgery: Secondary | ICD-10-CM | POA: Diagnosis not present

## 2021-12-01 DIAGNOSIS — R531 Weakness: Secondary | ICD-10-CM | POA: Diagnosis not present

## 2021-12-01 DIAGNOSIS — F32A Depression, unspecified: Secondary | ICD-10-CM | POA: Diagnosis not present

## 2021-12-01 DIAGNOSIS — G8929 Other chronic pain: Secondary | ICD-10-CM | POA: Diagnosis not present

## 2021-12-01 DIAGNOSIS — K219 Gastro-esophageal reflux disease without esophagitis: Secondary | ICD-10-CM | POA: Diagnosis not present

## 2021-12-01 DIAGNOSIS — F319 Bipolar disorder, unspecified: Secondary | ICD-10-CM | POA: Diagnosis not present

## 2021-12-01 DIAGNOSIS — T07XXXA Unspecified multiple injuries, initial encounter: Secondary | ICD-10-CM | POA: Diagnosis not present

## 2021-12-01 DIAGNOSIS — F1010 Alcohol abuse, uncomplicated: Secondary | ICD-10-CM | POA: Diagnosis not present

## 2021-12-01 DIAGNOSIS — S72001D Fracture of unspecified part of neck of right femur, subsequent encounter for closed fracture with routine healing: Secondary | ICD-10-CM | POA: Diagnosis not present

## 2021-12-01 DIAGNOSIS — Z7401 Bed confinement status: Secondary | ICD-10-CM | POA: Diagnosis not present

## 2021-12-01 DIAGNOSIS — M6281 Muscle weakness (generalized): Secondary | ICD-10-CM | POA: Diagnosis not present

## 2021-12-01 DIAGNOSIS — R2689 Other abnormalities of gait and mobility: Secondary | ICD-10-CM | POA: Diagnosis not present

## 2021-12-01 DIAGNOSIS — S72001A Fracture of unspecified part of neck of right femur, initial encounter for closed fracture: Secondary | ICD-10-CM | POA: Diagnosis not present

## 2021-12-01 DIAGNOSIS — J449 Chronic obstructive pulmonary disease, unspecified: Secondary | ICD-10-CM | POA: Diagnosis not present

## 2021-12-01 DIAGNOSIS — D509 Iron deficiency anemia, unspecified: Secondary | ICD-10-CM | POA: Diagnosis not present

## 2021-12-01 DIAGNOSIS — F411 Generalized anxiety disorder: Secondary | ICD-10-CM | POA: Diagnosis not present

## 2021-12-01 MED ORDER — ACETAMINOPHEN 325 MG PO TABS
650.0000 mg | ORAL_TABLET | ORAL | Status: DC | PRN
  Administered 2021-12-01: 650 mg via ORAL
  Filled 2021-12-01: qty 2

## 2021-12-01 MED ORDER — FERROUS SULFATE 325 (65 FE) MG PO TABS: 325.0000 mg | ORAL_TABLET | Freq: Every day | ORAL | 3 refills | Status: AC

## 2021-12-01 MED ORDER — OXYCODONE HCL 10 MG PO TABS: 10.0000 mg | ORAL_TABLET | Freq: Two times a day (BID) | ORAL | 0 refills | Status: AC | PRN

## 2021-12-01 MED ORDER — POLYETHYLENE GLYCOL 3350 17 G PO PACK: 17.0000 g | PACK | Freq: Every day | ORAL | 0 refills | Status: AC | PRN

## 2021-12-01 MED ORDER — XTAMPZA ER 9 MG PO C12A: 9.0000 mg | EXTENDED_RELEASE_CAPSULE | Freq: Two times a day (BID) | ORAL | 0 refills | Status: AC

## 2021-12-01 NOTE — Plan of Care (Signed)
Problem: Clinical Measurements: Goal: Ability to maintain clinical measurements within normal limits will improve Outcome: Progressing   Problem: Activity: Goal: Risk for activity intolerance will decrease Outcome: Progressing   Problem: Nutrition: Goal: Adequate nutrition will be maintained Outcome: Progressing   Problem: Pain Managment: Goal: General experience of comfort will improve Outcome: Progressing   Ivan Anchors, RN. 12/01/21 9:30 AM

## 2021-12-01 NOTE — Progress Notes (Signed)
Report called to Alford Highland, LPN at [facility]. AVS placed in discharge packet. Awaiting [facility] for transport. Ivan Anchors, RN 12/01/21 1:06 PM

## 2021-12-01 NOTE — Discharge Summary (Signed)
Physician Discharge Summary   Victoria Levine:[id number] DOB: 1950/09/22 DOA: 11/26/2021  PCP: Victoria Jordan, MD  Admit date: 11/26/2021 Discharge date:  12/01/2021 Barriers to discharge: none  Admitted From: Home Disposition:  SNF Discharging physician: Dwyane Dee, MD  Recommendations for Outpatient Follow-up:  Follow up with orthopedic surgery  Home Health:  Equipment/Devices:   Discharge Condition: stable CODE STATUS: Full Diet recommendation:  Diet Orders (From admission, onward)     Start     Ordered   12/01/21 0000  Diet - low sodium heart healthy        12/01/21 1128   11/28/21 0137  Diet Heart Room service appropriate? Yes; Fluid consistency: Thin  Diet effective now       Question Answer Comment  Room service appropriate? Yes   Fluid consistency: Thin      11/28/21 0136            Hospital Course: Victoria Levine is a 71 y.o. female with medical history significant of hypothyroidism, hypertension and depression who presented after suffering of a mechanical fall.  She was ambulating from the bathroom back to the bedroom when she slipped and fell. Work-up revealed a right hip fracture and she underwent right total hip arthroplasty with orthopedic surgery on 11/27/2021.  Assessment and Plan: * Closed right hip fracture ([place]) - S/p mechanical fall at home - Underwent right total hip arthroplasty with orthopedic surgery on 11/27/2021 - Continue pain control - Continue working with PT - Plan is for SNF at discharge  Acute hypoxemic respiratory failure (HCC)-resolved as of 11/29/2021 - Suspected due to some probable atelectasis - Patient has been weaned back down to room air  Hyponatremia-resolved as of 11/29/2021 - Mild and resolved with fluids - Continue diet  Normocytic anemia - Baseline hemoglobin appears to be around 11 to 12 g/dL - Some downtrend expected postop in setting of acute blood loss - Hemoglobin has been  stable  Depression Bipolar disorder.  Continue with oxcarbazepine, bupropion, fluoxetine and quetiapine.   GERD (gastroesophageal reflux disease) Continue antiacid therapy with pantoprazole and famotidine Patient has a large hiatal hernia.   Hypothyroidism Continue with levothyroxine.   Chronic pain - database reviewed - continue xtampza and oxy IR   The patient's chronic medical conditions were treated accordingly per the patient's home medication regimen except as noted.  On day of discharge, patient was felt deemed stable for discharge. Patient/family member advised to call PCP or come back to ER if needed.   Principal Diagnosis: Closed right hip fracture [facility])  Discharge Diagnoses: Active Hospital Problems   Diagnosis Date Noted   Closed right hip fracture ([place]) 11/26/2021    Priority: 1.   Normocytic anemia 11/26/2021    Priority: 4.   Depression 11/26/2021    Priority: 5.   GERD (gastroesophageal reflux disease) 08/13/2008    Priority: 6.   Hypothyroidism 08/13/2008    Priority: 7.   Chronic pain 07/26/2015    Resolved Hospital Problems   Diagnosis Date Noted Date Resolved   Acute hypoxemic respiratory failure ([place]) 11/26/2021 11/29/2021    Priority: 2.   Hyponatremia 11/26/2021 11/29/2021    Priority: 3.     Discharge Instructions     Diet - low sodium heart healthy   Complete by: As directed    Increase activity slowly   Complete by: As directed    Leave dressing on - Keep it clean, dry, and intact until clinic visit   Complete by: As directed  Allergies as of 12/01/2021       Reactions   Prednisone Other (See Comments)   Cannot tolerate ORALLY, but can tolerate by shot (interacts with anxiety & bipolarity)   Lithium Carbonate Other (See Comments)   Patient was taken off of this because of a weight gain of 40 pounds   Other Other (See Comments)   Serious reaction to 1st COVID vaccination - unknown type   Sulfa Antibiotics Other (See  Comments)   From childhood; reaction not recalled        Medication List     TAKE these medications    albuterol 108 (90 Base) MCG/ACT inhaler Commonly known as: VENTOLIN HFA Inhale 2 puffs into the lungs every 6 (six) hours as needed for wheezing or shortness of breath.   albuterol (2.5 MG/3ML) 0.083% nebulizer solution Commonly known as: PROVENTIL Take 2.5 mg by nebulization every 6 (six) hours as needed for wheezing or shortness of breath.   aspirin 81 MG chewable tablet Commonly known as: Aspirin Childrens Chew 1 tablet (81 mg total) by mouth 2 (two) times daily with a meal.   buPROPion 150 MG 24 hr tablet Commonly known as: WELLBUTRIN XL Take 150 mg by mouth 2 (two) times daily.   calcium carbonate 1250 (500 Ca) MG tablet Commonly known as: OS-CAL - dosed in mg of elemental calcium Take 1 tablet by mouth daily with breakfast.   carvedilol 6.25 MG tablet Commonly known as: COREG Take 6.25 mg by mouth daily as needed for heart rate control.   cholecalciferol 25 MCG (1000 UNIT) tablet Commonly known as: VITAMIN D3 Take 1,000 Units by mouth daily.   EPINEPHrine 0.3 mg/0.3 mL Soaj injection Commonly known as: EPI-PEN Inject 0.3 mg into the skin daily as needed for anaphylaxis.   famotidine 20 MG tablet Commonly known as: PEPCID Take 1 tablet (20 mg total) by mouth 2 (two) times daily.   ferrous sulfate 325 (65 FE) MG tablet Take 1 tablet (325 mg total) by mouth daily with breakfast. Start taking on: December 02, 2021 What changed: when to take this   FLUoxetine 20 MG capsule Commonly known as: PROZAC Take 20 mg by mouth 2 (two) times daily.   gabapentin 600 MG tablet Commonly known as: NEURONTIN Take 600 mg by mouth 3 (three) times daily as needed (restless leg).   levothyroxine 125 MCG tablet Commonly known as: SYNTHROID Take 1 tablet (125 mcg total) by mouth daily.   multivitamin with minerals Tabs tablet Take 1 tablet by mouth daily.   omeprazole  20 MG capsule Commonly known as: PRILOSEC Take 1 capsule (20 mg total) by mouth daily.   ondansetron 4 MG disintegrating tablet Commonly known as: ZOFRAN-ODT Take 1 tablet (4 mg total) by mouth every 8 (eight) hours as needed for nausea or vomiting.   oxcarbazepine 600 MG tablet Commonly known as: TRILEPTAL Take 600 mg by mouth 2 (two) times daily.   Oxycodone HCl 10 MG Tabs Take 1 tablet (10 mg total) by mouth 2 (two) times daily as needed (for breakthrough pain).   polyethylene glycol 17 g packet Commonly known as: MIRALAX / GLYCOLAX Take 17 g by mouth daily as needed for mild constipation.   QUEtiapine 400 MG tablet Commonly known as: SEROQUEL Take 200 mg by mouth every evening.   rizatriptan 10 MG tablet Commonly known as: MAXALT Take 1 tablet (10 mg total) by mouth daily as needed for migraine.   sucralfate 1 g tablet Commonly known as: Carafate Take  1 tablet (1 g total) by mouth 4 (four) times daily as needed.   Xtampza ER 9 MG C12a Generic drug: oxyCODONE ER Take 9 mg by mouth 2 (two) times daily.               Discharge Care Instructions  (From admission, onward)           Start     Ordered   12/01/21 0000  Leave dressing on - Keep it clean, dry, and intact until clinic visit        12/01/21 1128            Allergies  Allergen Reactions   Prednisone Other (See Comments)    Cannot tolerate ORALLY, but can tolerate by shot (interacts with anxiety & bipolarity)   Lithium Carbonate Other (See Comments)    Patient was taken off of this because of a weight gain of 40 pounds   Other Other (See Comments)    Serious reaction to 1st COVID vaccination - unknown type   Sulfa Antibiotics Other (See Comments)    From childhood; reaction not recalled    Consultations: Orthopedic surger  Procedures: Right total hip arthroplasty, 11/27/2021   Discharge Exam: BP (!) 175/98 (BP Location: Left Arm)   Pulse 85   Temp 100.1 F (37.8 C) (Oral)   Resp  18   Ht '5\' 6"'$  (1.676 m)   Wt 69.9 kg   SpO2 94%   BMI 24.86 kg/m  Physical Exam Constitutional:      Appearance: Normal appearance.  HENT:     Head: Normocephalic and atraumatic.     Mouth/Throat:     Mouth: Mucous membranes are moist.  Eyes:     Extraocular Movements: Extraocular movements intact.  Cardiovascular:     Rate and Rhythm: Normal rate and regular rhythm.  Pulmonary:     Effort: Pulmonary effort is normal.     Breath sounds: Normal breath sounds.  Abdominal:     General: Bowel sounds are normal. There is no distension.     Palpations: Abdomen is soft.     Tenderness: There is no abdominal tenderness.  Musculoskeletal:     Cervical back: Normal range of motion and neck supple.     Comments: Right hip noted with surgical dressing in place.  Compartments soft.  Neurological:     Mental Status: She is alert.     The results of significant diagnostics from this hospitalization (including imaging, microbiology, ancillary and laboratory) are listed below for reference.   Microbiology: Recent Results (from the past 240 hour(s))  Surgical pcr screen     Status: None   Collection Time: 11/26/21 11:23 PM   Specimen: Nasal Mucosa; Nasal Swab  Result Value Ref Range Status   MRSA, PCR NEGATIVE NEGATIVE Final   Staphylococcus aureus NEGATIVE NEGATIVE Final    Comment: (NOTE) The Xpert SA Assay (FDA approved for NASAL specimens in patients 2 years of age and older), is one component of a comprehensive surveillance program. It is not intended to diagnose infection nor to guide or monitor treatment. Performed at [facility], [place] [address] [place], [place] [address]      Labs: BNP (last 3 results) No results for input(s): "BNP" in the last 8760 hours. Basic Metabolic Panel: Recent Labs  Lab 11/26/21 0755 11/27/21 0325  NA 133* 135  K 4.2 3.9  CL 100 102  CO2 22 27  GLUCOSE 114* 135*  BUN 18 20  CREATININE 0.81  0.68  CALCIUM 9.8 9.4    Liver Function Tests: Recent Labs  Lab 11/26/21 0755  AST 21  ALT 18  ALKPHOS 61  BILITOT 0.4  PROT 6.2*  ALBUMIN 3.8   No results for input(s): "LIPASE", "AMYLASE" in the last 168 hours. No results for input(s): "AMMONIA" in the last 168 hours. CBC: Recent Labs  Lab 11/26/21 0755 11/27/21 0325 11/28/21 0335 11/29/21 0338  WBC 13.9* 11.2* 9.2 8.8  HGB 11.5* 10.7* 8.8* 8.7*  HCT 37.4 35.4* 28.4* 29.1*  MCV 86.6 88.7 88.2 89.8  PLT 240 281 219 213   Cardiac Enzymes: Recent Labs  Lab 11/26/21 0755  CKTOTAL 73  CKMB 3.1   BNP: Invalid input(s): "POCBNP" CBG: No results for input(s): "GLUCAP" in the last 168 hours. D-Dimer No results for input(s): "DDIMER" in the last 72 hours. Hgb A1c No results for input(s): "HGBA1C" in the last 72 hours. Lipid Profile No results for input(s): "CHOL", "HDL", "LDLCALC", "TRIG", "CHOLHDL", "LDLDIRECT" in the last 72 hours. Thyroid function studies No results for input(s): "TSH", "T4TOTAL", "T3FREE", "THYROIDAB" in the last 72 hours.  Invalid input(s): "FREET3" Anemia work up No results for input(s): "VITAMINB12", "FOLATE", "FERRITIN", "TIBC", "IRON", "RETICCTPCT" in the last 72 hours. Urinalysis    Component Value Date/Time   COLORURINE YELLOW 11/26/2021 2035   APPEARANCEUR CLEAR 11/26/2021 2035   LABSPEC 1.020 11/26/2021 2035   PHURINE 5.5 11/26/2021 2035   GLUCOSEU NEGATIVE 11/26/2021 2035   HGBUR NEGATIVE 11/26/2021 2035   BILIRUBINUR NEGATIVE 11/26/2021 2035   KETONESUR NEGATIVE 11/26/2021 2035   PROTEINUR NEGATIVE 11/26/2021 2035   UROBILINOGEN 0.2 11/12/2013 2233   NITRITE NEGATIVE 11/26/2021 2035   LEUKOCYTESUR NEGATIVE 11/26/2021 2035   Sepsis Labs Recent Labs  Lab 11/26/21 0755 11/27/21 0325 11/28/21 0335 11/29/21 0338  WBC 13.9* 11.2* 9.2 8.8   Microbiology Recent Results (from the past 240 hour(s))  Surgical pcr screen     Status: None   Collection Time: 11/26/21 11:23 PM   Specimen: Nasal  Mucosa; Nasal Swab  Result Value Ref Range Status   MRSA, PCR NEGATIVE NEGATIVE Final   Staphylococcus aureus NEGATIVE NEGATIVE Final    Comment: (NOTE) The Xpert SA Assay (FDA approved for NASAL specimens in patients 24 years of age and older), is one component of a comprehensive surveillance program. It is not intended to diagnose infection nor to guide or monitor treatment. Performed at [facility], [place] [address] [place], [place] [address]     Procedures/Studies: DG HIP UNILAT WITH PELVIS 1V RIGHT  Result Date: 11/27/2021 CLINICAL DATA:  Right hip replacement EXAM: DG HIP (WITH OR WITHOUT PELVIS) 1V RIGHT COMPARISON:  11/26/2021 FINDINGS: Intraoperative spot images demonstrate changes of right hip replacement. Normal AP alignment. No visible hardware complicating feature. IMPRESSION: Right hip replacement.  No visible complicating feature. Electronically Signed   By: Rolm Baptise M.D.   On: 11/27/2021 19:17   DG Pelvis Portable  Result Date: 11/27/2021 CLINICAL DATA:  Total right hip arthroplasty EXAM: PORTABLE PELVIS 1-2 VIEWS COMPARISON:  02/24/2020 FINDINGS: Remote changes of left hip replacement. New right hip replacement. Normal AP alignment. No hardware bony complicating feature. IMPRESSION: Right hip replacement.  No visible complicating feature. Electronically Signed   By: Rolm Baptise M.D.   On: 11/27/2021 19:16   DG C-Arm 1-60 Min-No Report  Result Date: 11/27/2021 Fluoroscopy was utilized by the requesting physician.  No radiographic interpretation.   DG C-Arm 1-60 Min-No Report  Result Date: 11/27/2021 Fluoroscopy was  utilized by the requesting physician.  No radiographic interpretation.   DG Knee Right Port  Result Date: 11/27/2021 CLINICAL DATA:  Closed displaced fracture right femoral neck. EXAM: PORTABLE RIGHT KNEE - 1-2 VIEW COMPARISON:  Correlation with pelvis and right hip radiographs 11/26/2021 FINDINGS: Mild medial joint space narrowing  and peripheral osteophytosis. Mild patella alta. Minimal superior and inferior patellar degenerative osteophytes. No joint effusion. No acute fracture or dislocation. IMPRESSION: Mild patella alta. Mild medial compartment and minimal patellofemoral compartment osteoarthritis. Electronically Signed   By: Yvonne Kendall M.D.   On: 11/27/2021 08:41   CT Angio Chest PE W and/or Wo Contrast  Result Date: 11/26/2021 CLINICAL DATA:  Suspect pulmonary embolism.  Unknown D-dimer. EXAM: CT ANGIOGRAPHY CHEST WITH CONTRAST TECHNIQUE: Multidetector CT imaging of the chest was performed using the standard protocol during bolus administration of intravenous contrast. Multiplanar CT image reconstructions and MIPs were obtained to evaluate the vascular anatomy. RADIATION DOSE REDUCTION: This exam was performed according to the departmental dose-optimization program which includes automated exposure control, adjustment of the mA and/or kV according to patient size and/or use of iterative reconstruction technique. CONTRAST:  58m OMNIPAQUE IOHEXOL 350 MG/ML SOLN COMPARISON:  Chest x-ray 11/26/2021 CT thoracic spine 11/26/2016 FINDINGS: Cardiovascular: Borderline cardiomegaly. Prominent right pericardiac fat pad. Thoracic aorta is normal in caliber. Minimal calcified plaque over the descending thoracic aorta. Pulmonary arterial system is adequately opacified and demonstrates no evidence of emboli. Remaining vascular structures are unremarkable. Mediastinum/Nodes: No mediastinal or hilar adenopathy. Moderate to large hiatal hernia. Prominent right pericardiac fat. Lungs/Pleura: Lungs are adequately inflated with mild atelectasis/scarring over the right middle lobe adjacent the prominent right pericardiac fat pad. Minimal atelectatic change over the left lung base adjacent the lateral aspect of the large hiatal hernia. No acute airspace process or effusion. Airways are otherwise unremarkable. Upper Abdomen: Calcified plaque over the  abdominal aorta. Mild diverticulosis of the colon. No acute findings. Musculoskeletal: Previous kyphoplasties of T12 and L2. Stable compression deformity of L1. Moderate compression fracture of T8 and severe compression fracture of T3 likely chronic, although new since 2018. Review of the MIP images confirms the above findings. IMPRESSION: 1. No evidence of pulmonary embolism. No acute cardiopulmonary disease. 2. Moderate to large hiatal hernia. Prominent right pericardiac fat pad. 3. Multiple thoracic compression fractures as described likely chronic, although new since 2018. Chronic lumbar compression fractures. 4. Colonic diverticulosis. 5. Aortic atherosclerosis. Aortic Atherosclerosis (ICD10-I70.0). Electronically Signed   By: DMarin OlpM.D.   On: 11/26/2021 10:53   CT Cervical Spine Wo Contrast  Result Date: 11/26/2021 CLINICAL DATA:  Neck trauma (Age >= 65y) EXAM: CT CERVICAL SPINE WITHOUT CONTRAST TECHNIQUE: Multidetector CT imaging of the cervical spine was performed without intravenous contrast. Multiplanar CT image reconstructions were also generated. RADIATION DOSE REDUCTION: This exam was performed according to the departmental dose-optimization program which includes automated exposure control, adjustment of the mA and/or kV according to patient size and/or use of iterative reconstruction technique. COMPARISON:  None Available. FINDINGS: Alignment: Facet joints are aligned without dislocation or traumatic listhesis. Dens and lateral masses are aligned. Degenerative grade 1 anterolisthesis of C3 on C4. Skull base and vertebrae: No acute fracture. No primary bone lesion or focal pathologic process. Soft tissues and spinal canal: No prevertebral fluid or swelling. No visible canal hematoma. Disc levels: Mild disc height loss, most pronounced at C5-6. Moderate-advanced multilevel facet arthropathy most severe at C3-4 on the left. Upper chest: Negative. Other: None. IMPRESSION: No acute fracture or  traumatic  listhesis of the cervical spine. Electronically Signed   By: Davina Poke D.O.   On: 11/26/2021 10:48   DG Chest 1 View  Result Date: 11/26/2021 CLINICAL DATA:  Status post fall. 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 Scar versus subsegmental atelectasis noted within the left midlung and left base. No signs of pleural effusion or edema. No airspace opacities identified. Visualized osseous structures appear grossly intact. IMPRESSION: 1. Scar versus platelike atelectasis within the left lung. No signs of pleural effusion or pneumothorax. No airspace opacities. 2. Large hiatal hernia. Electronically Signed   By: Kerby Moors M.D.   On: 11/26/2021 08:33   DG Hip Unilat W or Wo Pelvis 2-3 Views Right  Result Date: 11/26/2021 CLINICAL DATA:  Fall. EXAM: DG HIP (WITH OR WITHOUT PELVIS) 2-3V RIGHT COMPARISON:  03/09/2020 FINDINGS: There is diffuse decreased bone mineralization. Examination demonstrates a displaced subcapital fracture of the right femoral neck with superolateral displacement of the distal fragment. Left hip arthroplasty intact and unchanged. Fusion hardware over the lumbar spine. Degenerative changes of the spine. IMPRESSION: Displaced subcapital fracture of the right femoral neck. Electronically Signed   By: Marin Olp M.D.   On: 11/26/2021 08:32   CT HEAD WO CONTRAST  Result Date: 11/26/2021 CLINICAL DATA:  Moderate to severe head and facial trauma EXAM: CT HEAD WITHOUT CONTRAST CT MAXILLOFACIAL WITHOUT CONTRAST TECHNIQUE: Multidetector CT imaging of the head and maxillofacial structures were performed using the standard protocol without intravenous contrast. Multiplanar CT image reconstructions of the maxillofacial structures were also generated. RADIATION DOSE REDUCTION: This exam was performed according to the departmental  dose-optimization program which includes automated exposure control, adjustment of the mA and/or kV according to patient size and/or use of iterative reconstruction technique. COMPARISON:  Prior CT scan of the head 01/26/2021 FINDINGS: CT HEAD FINDINGS Brain: No evidence of acute infarction, hemorrhage, hydrocephalus, extra-axial collection or mass lesion/mass effect. Mild cerebral cortical volume loss. Tiny remote lacunar infarct in the right cerebellum again noted. Rounded calcification affiliated with the posterior falx measures 1.1 x 1.2 cm. This is essentially unchanged compared to prior. Vascular: No hyperdense vessel or unexpected calcification. Skull: Normal. Negative for fracture or focal lesion. Other: None. CT MAXILLOFACIAL FINDINGS Osseous: No fracture or mandibular dislocation. No destructive process. Orbits: Preseptal periorbital soft tissue edema on the right consistent with contusion. No evidence of injury to the globe or extension retro-orbital. mild exophthalmos bilaterally. Sinuses: Clear. Soft tissues: Right preseptal periorbital soft tissue contusion. IMPRESSION: CT HEAD 1. No acute intracranial abnormality. 2. Stable cortical atrophy and small remote right cerebellar lacunar infarct. 3. Stable rounded calcification along the left posterior falx likely representing a benign meningioma. CT CSPINE 1. Right preseptal periorbital soft tissue contusion without evidence of globe injury, retro bulb are contusion were facial fracture. 2. Mild exophthalmos bilaterally. Electronically Signed   By: Jacqulynn Cadet M.D.   On: 11/26/2021 08:19   CT MAXILLOFACIAL WO CONTRAST  Result Date: 11/26/2021 CLINICAL DATA:  Moderate to severe head and facial trauma EXAM: CT HEAD WITHOUT CONTRAST CT MAXILLOFACIAL WITHOUT CONTRAST TECHNIQUE: Multidetector CT imaging of the head and maxillofacial structures were performed using the standard protocol without intravenous contrast. Multiplanar CT image reconstructions  of the maxillofacial structures were also generated. RADIATION DOSE REDUCTION: This exam was performed according to the departmental dose-optimization program which includes automated exposure control, adjustment of the mA and/or kV  according to patient size and/or use of iterative reconstruction technique. COMPARISON:  Prior CT scan of the head 01/26/2021 FINDINGS: CT HEAD FINDINGS Brain: No evidence of acute infarction, hemorrhage, hydrocephalus, extra-axial collection or mass lesion/mass effect. Mild cerebral cortical volume loss. Tiny remote lacunar infarct in the right cerebellum again noted. Rounded calcification affiliated with the posterior falx measures 1.1 x 1.2 cm. This is essentially unchanged compared to prior. Vascular: No hyperdense vessel or unexpected calcification. Skull: Normal. Negative for fracture or focal lesion. Other: None. CT MAXILLOFACIAL FINDINGS Osseous: No fracture or mandibular dislocation. No destructive process. Orbits: Preseptal periorbital soft tissue edema on the right consistent with contusion. No evidence of injury to the globe or extension retro-orbital. mild exophthalmos bilaterally. Sinuses: Clear. Soft tissues: Right preseptal periorbital soft tissue contusion. IMPRESSION: CT HEAD 1. No acute intracranial abnormality. 2. Stable cortical atrophy and small remote right cerebellar lacunar infarct. 3. Stable rounded calcification along the left posterior falx likely representing a benign meningioma. CT CSPINE 1. Right preseptal periorbital soft tissue contusion without evidence of globe injury, retro bulb are contusion were facial fracture. 2. Mild exophthalmos bilaterally. Electronically Signed   By: Jacqulynn Cadet M.D.   On: 11/26/2021 08:19     Time coordinating discharge: Over 30 minutes    Dwyane Dee, MD  [facility] 12/01/2021, 11:29 AM

## 2021-12-01 NOTE — Progress Notes (Signed)
[facility] arrived to transfer patient to [facility]. Updated discharge packet with last time of medications given.  Ivan Anchors, RN 12/01/21 5:03 PM

## 2021-12-01 NOTE — Plan of Care (Signed)
Patient discharging to [facility]. Awaiting transfer by [facility]. Ivan Anchors, RN. 12/01/21 1:09 PM

## 2021-12-01 NOTE — Assessment & Plan Note (Signed)
-   database reviewed - continue xtampza and oxy IR

## 2021-12-01 NOTE — Plan of Care (Signed)

## 2021-12-04 DIAGNOSIS — J449 Chronic obstructive pulmonary disease, unspecified: Secondary | ICD-10-CM | POA: Diagnosis not present

## 2021-12-04 DIAGNOSIS — E039 Hypothyroidism, unspecified: Secondary | ICD-10-CM | POA: Diagnosis not present

## 2021-12-04 DIAGNOSIS — S72001S Fracture of unspecified part of neck of right femur, sequela: Secondary | ICD-10-CM | POA: Diagnosis not present

## 2021-12-04 DIAGNOSIS — Z471 Aftercare following joint replacement surgery: Secondary | ICD-10-CM | POA: Diagnosis not present

## 2021-12-05 DIAGNOSIS — F32A Depression, unspecified: Secondary | ICD-10-CM | POA: Diagnosis not present

## 2021-12-05 DIAGNOSIS — K219 Gastro-esophageal reflux disease without esophagitis: Secondary | ICD-10-CM | POA: Diagnosis not present

## 2021-12-05 DIAGNOSIS — G8929 Other chronic pain: Secondary | ICD-10-CM | POA: Diagnosis not present

## 2021-12-05 DIAGNOSIS — J449 Chronic obstructive pulmonary disease, unspecified: Secondary | ICD-10-CM | POA: Diagnosis not present

## 2021-12-06 DIAGNOSIS — F411 Generalized anxiety disorder: Secondary | ICD-10-CM | POA: Diagnosis not present

## 2021-12-06 DIAGNOSIS — S72001D Fracture of unspecified part of neck of right femur, subsequent encounter for closed fracture with routine healing: Secondary | ICD-10-CM | POA: Diagnosis not present

## 2021-12-06 DIAGNOSIS — F319 Bipolar disorder, unspecified: Secondary | ICD-10-CM | POA: Diagnosis not present

## 2021-12-06 DIAGNOSIS — Z9181 History of falling: Secondary | ICD-10-CM | POA: Diagnosis not present

## 2021-12-06 DIAGNOSIS — F1010 Alcohol abuse, uncomplicated: Secondary | ICD-10-CM | POA: Diagnosis not present

## 2021-12-06 DIAGNOSIS — Z471 Aftercare following joint replacement surgery: Secondary | ICD-10-CM | POA: Diagnosis not present

## 2021-12-06 DIAGNOSIS — J449 Chronic obstructive pulmonary disease, unspecified: Secondary | ICD-10-CM | POA: Diagnosis not present

## 2021-12-06 DIAGNOSIS — D509 Iron deficiency anemia, unspecified: Secondary | ICD-10-CM | POA: Diagnosis not present

## 2021-12-06 DIAGNOSIS — E039 Hypothyroidism, unspecified: Secondary | ICD-10-CM | POA: Diagnosis not present

## 2021-12-08 DIAGNOSIS — J449 Chronic obstructive pulmonary disease, unspecified: Secondary | ICD-10-CM | POA: Diagnosis not present

## 2021-12-08 DIAGNOSIS — Z471 Aftercare following joint replacement surgery: Secondary | ICD-10-CM | POA: Diagnosis not present

## 2021-12-08 DIAGNOSIS — E039 Hypothyroidism, unspecified: Secondary | ICD-10-CM | POA: Diagnosis not present

## 2021-12-08 DIAGNOSIS — S72001S Fracture of unspecified part of neck of right femur, sequela: Secondary | ICD-10-CM | POA: Diagnosis not present

## 2021-12-11 DIAGNOSIS — S72002D Fracture of unspecified part of neck of left femur, subsequent encounter for closed fracture with routine healing: Secondary | ICD-10-CM | POA: Diagnosis not present

## 2021-12-12 DIAGNOSIS — Z96641 Presence of right artificial hip joint: Secondary | ICD-10-CM | POA: Diagnosis not present

## 2021-12-12 DIAGNOSIS — J449 Chronic obstructive pulmonary disease, unspecified: Secondary | ICD-10-CM | POA: Diagnosis not present

## 2021-12-12 DIAGNOSIS — G8929 Other chronic pain: Secondary | ICD-10-CM | POA: Diagnosis not present

## 2021-12-12 DIAGNOSIS — Z4789 Encounter for other orthopedic aftercare: Secondary | ICD-10-CM | POA: Diagnosis not present

## 2021-12-13 DIAGNOSIS — D509 Iron deficiency anemia, unspecified: Secondary | ICD-10-CM | POA: Diagnosis not present

## 2021-12-13 DIAGNOSIS — F319 Bipolar disorder, unspecified: Secondary | ICD-10-CM | POA: Diagnosis not present

## 2021-12-13 DIAGNOSIS — Z471 Aftercare following joint replacement surgery: Secondary | ICD-10-CM | POA: Diagnosis not present

## 2021-12-13 DIAGNOSIS — J449 Chronic obstructive pulmonary disease, unspecified: Secondary | ICD-10-CM | POA: Diagnosis not present

## 2021-12-13 DIAGNOSIS — S72001D Fracture of unspecified part of neck of right femur, subsequent encounter for closed fracture with routine healing: Secondary | ICD-10-CM | POA: Diagnosis not present

## 2021-12-13 DIAGNOSIS — F1010 Alcohol abuse, uncomplicated: Secondary | ICD-10-CM | POA: Diagnosis not present

## 2021-12-13 DIAGNOSIS — E039 Hypothyroidism, unspecified: Secondary | ICD-10-CM | POA: Diagnosis not present

## 2021-12-13 DIAGNOSIS — F411 Generalized anxiety disorder: Secondary | ICD-10-CM | POA: Diagnosis not present

## 2021-12-13 DIAGNOSIS — Z9181 History of falling: Secondary | ICD-10-CM | POA: Diagnosis not present

## 2021-12-14 DIAGNOSIS — Z4789 Encounter for other orthopedic aftercare: Secondary | ICD-10-CM | POA: Diagnosis not present

## 2021-12-14 DIAGNOSIS — G8929 Other chronic pain: Secondary | ICD-10-CM | POA: Diagnosis not present

## 2021-12-14 DIAGNOSIS — E039 Hypothyroidism, unspecified: Secondary | ICD-10-CM | POA: Diagnosis not present

## 2021-12-14 DIAGNOSIS — Z96641 Presence of right artificial hip joint: Secondary | ICD-10-CM | POA: Diagnosis not present

## 2021-12-17 DIAGNOSIS — F319 Bipolar disorder, unspecified: Secondary | ICD-10-CM | POA: Diagnosis not present

## 2021-12-17 DIAGNOSIS — I10 Essential (primary) hypertension: Secondary | ICD-10-CM | POA: Diagnosis not present

## 2021-12-17 DIAGNOSIS — K219 Gastro-esophageal reflux disease without esophagitis: Secondary | ICD-10-CM | POA: Diagnosis not present

## 2021-12-17 DIAGNOSIS — Z96641 Presence of right artificial hip joint: Secondary | ICD-10-CM | POA: Diagnosis not present

## 2021-12-17 DIAGNOSIS — G8929 Other chronic pain: Secondary | ICD-10-CM | POA: Diagnosis not present

## 2021-12-17 DIAGNOSIS — M519 Unspecified thoracic, thoracolumbar and lumbosacral intervertebral disc disorder: Secondary | ICD-10-CM | POA: Diagnosis not present

## 2021-12-17 DIAGNOSIS — E871 Hypo-osmolality and hyponatremia: Secondary | ICD-10-CM | POA: Diagnosis not present

## 2021-12-17 DIAGNOSIS — J9601 Acute respiratory failure with hypoxia: Secondary | ICD-10-CM | POA: Diagnosis not present

## 2021-12-17 DIAGNOSIS — S72011D Unspecified intracapsular fracture of right femur, subsequent encounter for closed fracture with routine healing: Secondary | ICD-10-CM | POA: Diagnosis not present

## 2021-12-20 DIAGNOSIS — E039 Hypothyroidism, unspecified: Secondary | ICD-10-CM | POA: Diagnosis not present

## 2021-12-20 DIAGNOSIS — M810 Age-related osteoporosis without current pathological fracture: Secondary | ICD-10-CM | POA: Diagnosis not present

## 2021-12-20 DIAGNOSIS — J441 Chronic obstructive pulmonary disease with (acute) exacerbation: Secondary | ICD-10-CM | POA: Diagnosis not present

## 2021-12-20 DIAGNOSIS — F3131 Bipolar disorder, current episode depressed, mild: Secondary | ICD-10-CM | POA: Diagnosis not present

## 2021-12-20 DIAGNOSIS — E7800 Pure hypercholesterolemia, unspecified: Secondary | ICD-10-CM | POA: Diagnosis not present

## 2021-12-20 DIAGNOSIS — I10 Essential (primary) hypertension: Secondary | ICD-10-CM | POA: Diagnosis not present

## 2021-12-20 DIAGNOSIS — D649 Anemia, unspecified: Secondary | ICD-10-CM | POA: Diagnosis not present

## 2021-12-29 DIAGNOSIS — M47816 Spondylosis without myelopathy or radiculopathy, lumbar region: Secondary | ICD-10-CM | POA: Diagnosis not present

## 2021-12-29 DIAGNOSIS — M545 Low back pain, unspecified: Secondary | ICD-10-CM | POA: Diagnosis not present

## 2021-12-29 DIAGNOSIS — F1120 Opioid dependence, uncomplicated: Secondary | ICD-10-CM | POA: Diagnosis not present

## 2021-12-29 DIAGNOSIS — Z79899 Other long term (current) drug therapy: Secondary | ICD-10-CM | POA: Diagnosis not present

## 2021-12-29 DIAGNOSIS — G894 Chronic pain syndrome: Secondary | ICD-10-CM | POA: Diagnosis not present

## 2021-12-29 DIAGNOSIS — R030 Elevated blood-pressure reading, without diagnosis of hypertension: Secondary | ICD-10-CM | POA: Diagnosis not present

## 2021-12-29 DIAGNOSIS — Z9181 History of falling: Secondary | ICD-10-CM | POA: Diagnosis not present

## 2021-12-29 DIAGNOSIS — Z6824 Body mass index (BMI) 24.0-24.9, adult: Secondary | ICD-10-CM | POA: Diagnosis not present

## 2021-12-29 DIAGNOSIS — M810 Age-related osteoporosis without current pathological fracture: Secondary | ICD-10-CM | POA: Diagnosis not present

## 2021-12-29 DIAGNOSIS — J449 Chronic obstructive pulmonary disease, unspecified: Secondary | ICD-10-CM | POA: Diagnosis not present

## 2022-01-03 DIAGNOSIS — Z79899 Other long term (current) drug therapy: Secondary | ICD-10-CM | POA: Diagnosis not present

## 2022-01-04 DIAGNOSIS — G894 Chronic pain syndrome: Secondary | ICD-10-CM | POA: Diagnosis not present

## 2022-01-04 DIAGNOSIS — Z6824 Body mass index (BMI) 24.0-24.9, adult: Secondary | ICD-10-CM | POA: Diagnosis not present

## 2022-01-04 DIAGNOSIS — F1120 Opioid dependence, uncomplicated: Secondary | ICD-10-CM | POA: Diagnosis not present

## 2022-01-04 DIAGNOSIS — M545 Low back pain, unspecified: Secondary | ICD-10-CM | POA: Diagnosis not present

## 2022-01-04 DIAGNOSIS — R030 Elevated blood-pressure reading, without diagnosis of hypertension: Secondary | ICD-10-CM | POA: Diagnosis not present

## 2022-01-04 DIAGNOSIS — Z9181 History of falling: Secondary | ICD-10-CM | POA: Diagnosis not present

## 2022-01-04 DIAGNOSIS — M47816 Spondylosis without myelopathy or radiculopathy, lumbar region: Secondary | ICD-10-CM | POA: Diagnosis not present

## 2022-01-04 DIAGNOSIS — Z79899 Other long term (current) drug therapy: Secondary | ICD-10-CM | POA: Diagnosis not present

## 2022-01-10 DIAGNOSIS — I469 Cardiac arrest, cause unspecified: Secondary | ICD-10-CM | POA: Diagnosis not present

## 2022-01-10 DIAGNOSIS — R55 Syncope and collapse: Secondary | ICD-10-CM | POA: Diagnosis not present

## 2022-01-10 DIAGNOSIS — R404 Transient alteration of awareness: Secondary | ICD-10-CM | POA: Diagnosis not present

## 2022-11-07 IMAGING — CT CT HEAD W/O CM
4 series · 16 of 47 positions shown, 18 images · non-contrast
Comparison: March 02, 2020

CLINICAL DATA: Head trauma

EXAM:
CT HEAD WITHOUT CONTRAST
TECHNIQUE: Contiguous axial images were obtained from the base of the skull
through the vertex without intravenous contrast.

[Series 3: head without · axial · non-contrast · 0.43mm/px · z∈[-49,+51]mm · 6 of 30 slices shown, 8 images]
[im 5/30  brain]
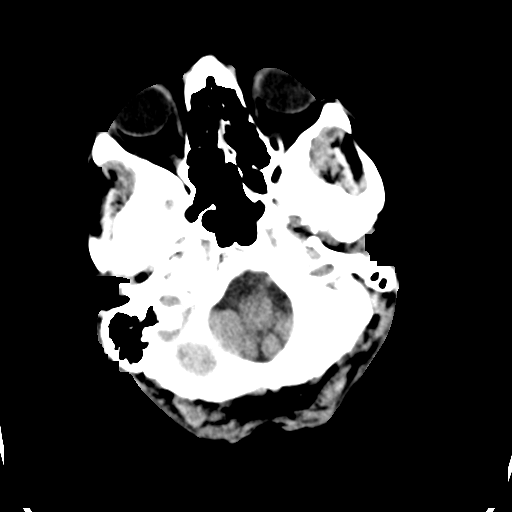
[im 5/30  bone]
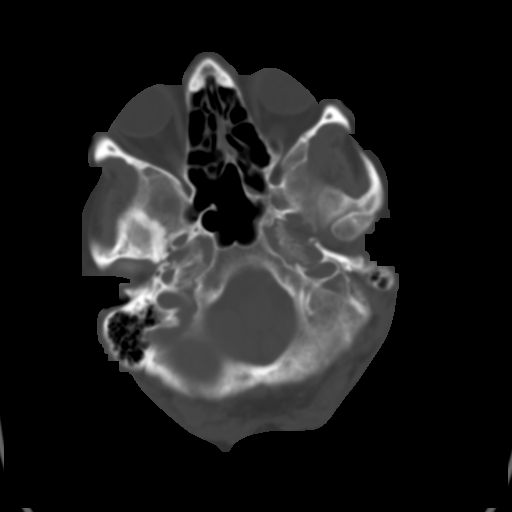
[im 9/30  brain]
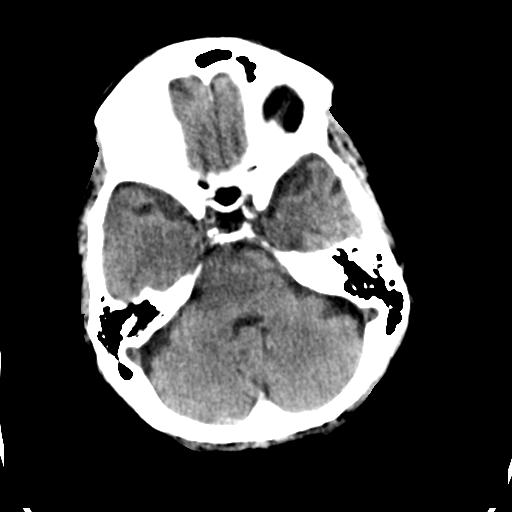
[im 13/30  brain]
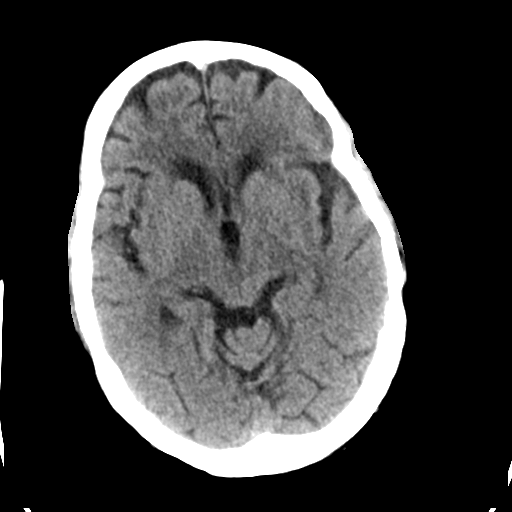
[im 17/30  brain]
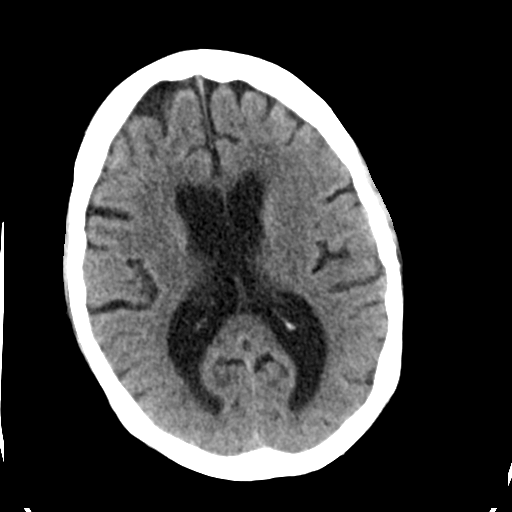
[im 21/30  brain]
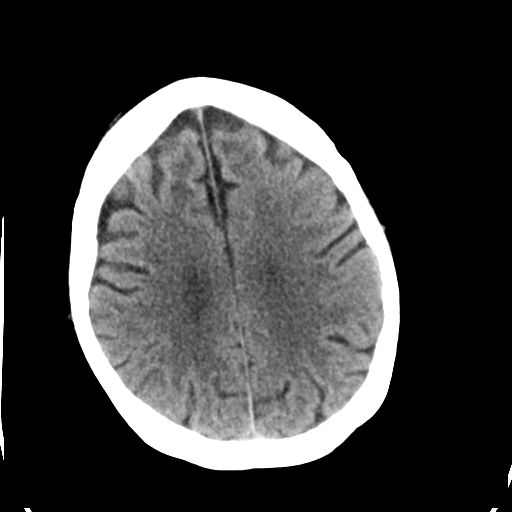
[im 21/30  bone]
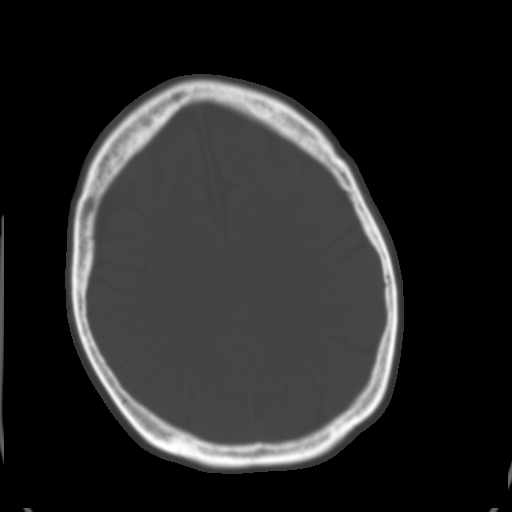
[im 25/30  brain]
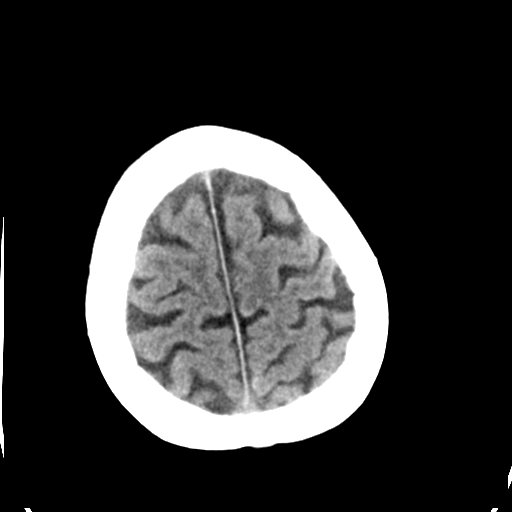

[Series 4: head bone · axial · 0.43mm/px · z∈[-55,+1]mm · 4 of 83 slices shown]
[im 8/83  bone]
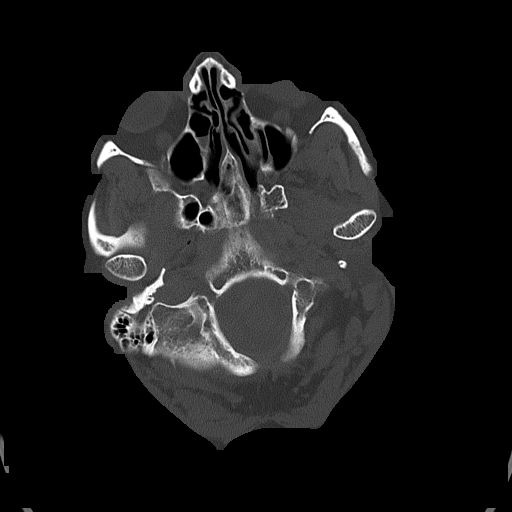
[im 16/83  bone]
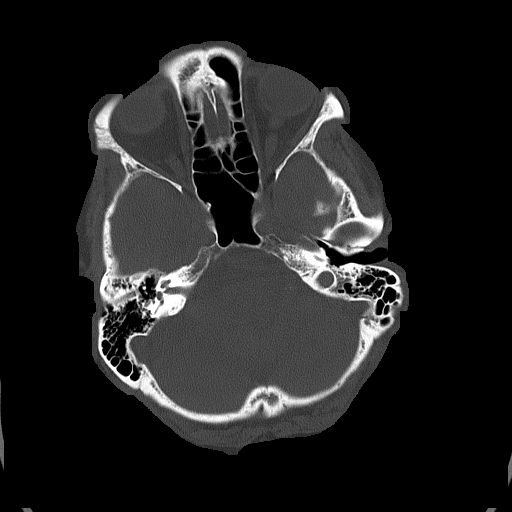
[im 28/83  bone]
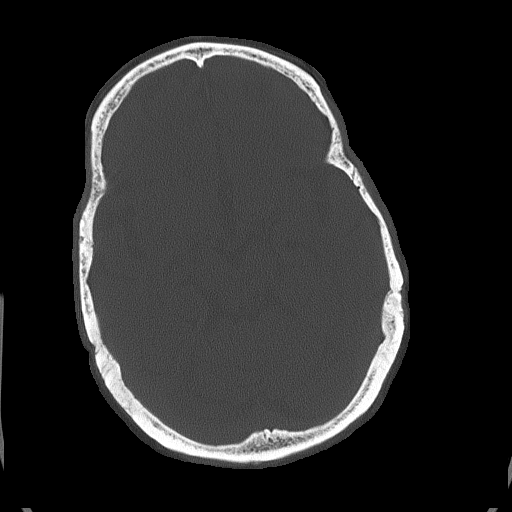
[im 36/83  bone]
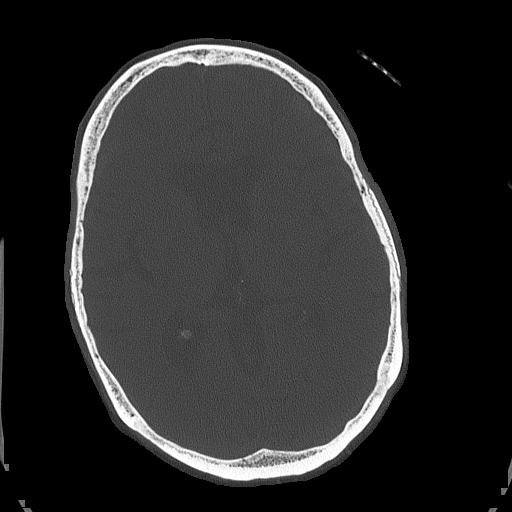

[Series 5: head without cor · coronal · non-contrast · 0.34mm/px · 3 of 67 slices shown]
[im 23/67  brain]
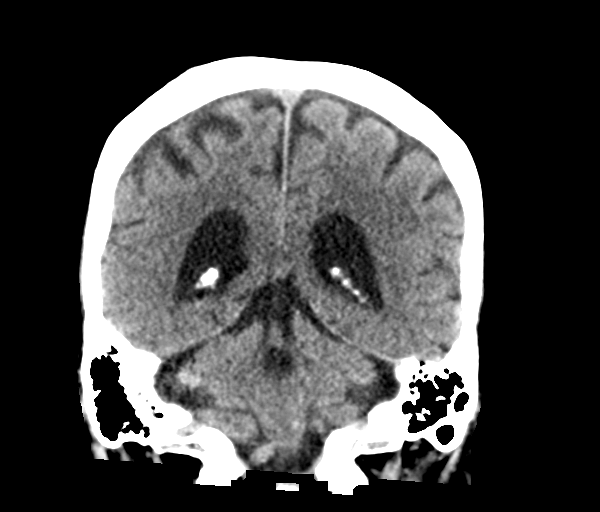
[im 30/67  brain]
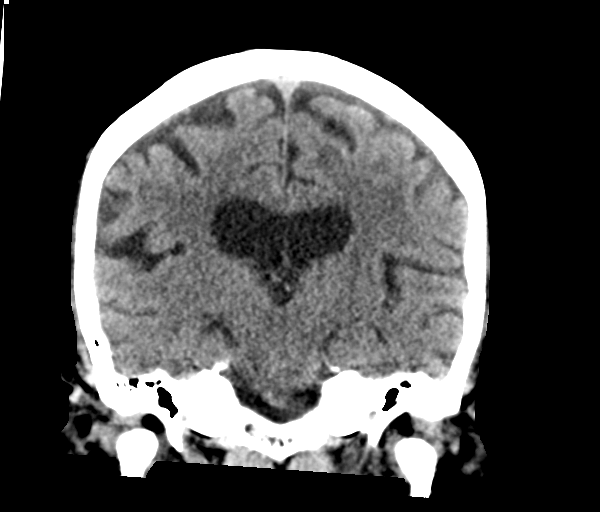
[im 37/67  brain]
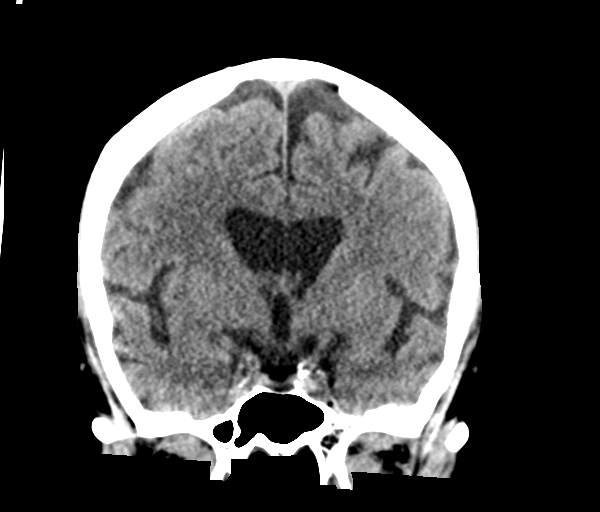

[Series 6: head without sag · sagittal · non-contrast · 0.32mm/px · 3 of 62 slices shown]
[im 23/62  brain]
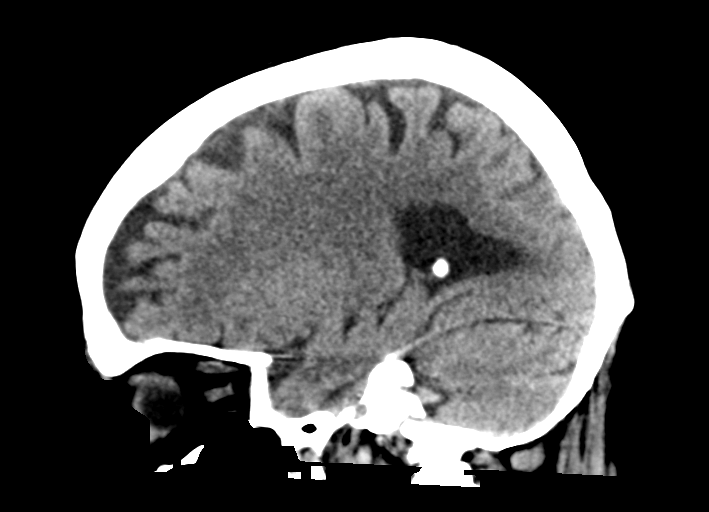
[im 31/62  brain]
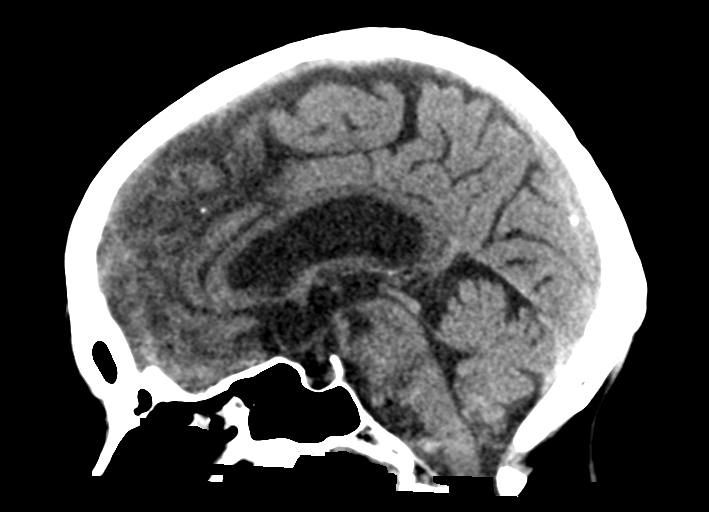
[im 40/62  brain]
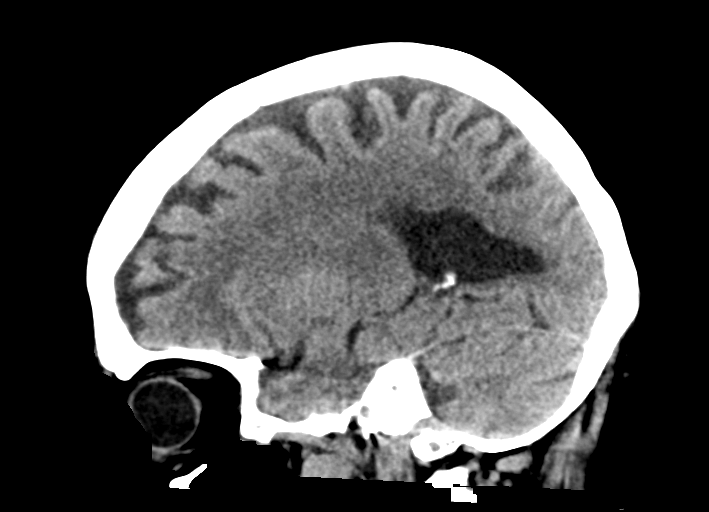

[16 of 47 positions shown; findings below may reference images not displayed]

FINDINGS: Brain: No evidence of acute territorial infarction, hemorrhage,
hydrocephalus,extra-axial collection or mass lesion/mass effect.
There is dilatation the ventricles and sulci consistent with
age-related atrophy. Low-attenuation changes in the deep white
matter consistent with small vessel ischemia. Tiny calcific density
overlying the posterior falx, likely meningioma.

Vascular: No hyperdense vessel or unexpected calcification.

Skull: The skull is intact. No fracture or focal lesion identified.

Sinuses/Orbits: Mucosal thickening seen of the ethmoid air cells.
The orbits and globes intact.

Other: None
IMPRESSION: No acute intracranial abnormality.

Findings consistent with age related atrophy and chronic small
vessel ischemia
